# Patient Record
Sex: Male | Born: 1945
Health system: Southern US, Community
[De-identification: ages and names within clinical notes are randomized; demographics above are authoritative.]

## PROBLEM LIST (undated history)

## (undated) DIAGNOSIS — M1990 Unspecified osteoarthritis, unspecified site: Secondary | ICD-10-CM

## (undated) DIAGNOSIS — I252 Old myocardial infarction: Secondary | ICD-10-CM

## (undated) DIAGNOSIS — Z9289 Personal history of other medical treatment: Secondary | ICD-10-CM

## (undated) DIAGNOSIS — I4901 Ventricular fibrillation: Secondary | ICD-10-CM

## (undated) DIAGNOSIS — D649 Anemia, unspecified: Secondary | ICD-10-CM

## (undated) DIAGNOSIS — J439 Emphysema, unspecified: Secondary | ICD-10-CM

## (undated) DIAGNOSIS — F32A Depression, unspecified: Secondary | ICD-10-CM

## (undated) DIAGNOSIS — R569 Unspecified convulsions: Secondary | ICD-10-CM

## (undated) DIAGNOSIS — J80 Acute respiratory distress syndrome: Secondary | ICD-10-CM

## (undated) DIAGNOSIS — G4730 Sleep apnea, unspecified: Secondary | ICD-10-CM

## (undated) DIAGNOSIS — I5020 Unspecified systolic (congestive) heart failure: Secondary | ICD-10-CM

## (undated) DIAGNOSIS — C3410 Malignant neoplasm of upper lobe, unspecified bronchus or lung: Secondary | ICD-10-CM

## (undated) DIAGNOSIS — I509 Heart failure, unspecified: Secondary | ICD-10-CM

## (undated) DIAGNOSIS — I219 Acute myocardial infarction, unspecified: Secondary | ICD-10-CM

## (undated) DIAGNOSIS — I82409 Acute embolism and thrombosis of unspecified deep veins of unspecified lower extremity: Secondary | ICD-10-CM

## (undated) DIAGNOSIS — K8020 Calculus of gallbladder without cholecystitis without obstruction: Secondary | ICD-10-CM

## (undated) DIAGNOSIS — I2510 Atherosclerotic heart disease of native coronary artery without angina pectoris: Secondary | ICD-10-CM

## (undated) DIAGNOSIS — Q211 Atrial septal defect: Secondary | ICD-10-CM

## (undated) DIAGNOSIS — G4733 Obstructive sleep apnea (adult) (pediatric): Secondary | ICD-10-CM

## (undated) DIAGNOSIS — H269 Unspecified cataract: Secondary | ICD-10-CM

## (undated) DIAGNOSIS — I639 Cerebral infarction, unspecified: Secondary | ICD-10-CM

## (undated) DIAGNOSIS — T7840XA Allergy, unspecified, initial encounter: Secondary | ICD-10-CM

## (undated) DIAGNOSIS — C3490 Malignant neoplasm of unspecified part of unspecified bronchus or lung: Secondary | ICD-10-CM

## (undated) DIAGNOSIS — I255 Ischemic cardiomyopathy: Secondary | ICD-10-CM

## (undated) DIAGNOSIS — Z95810 Presence of automatic (implantable) cardiac defibrillator: Secondary | ICD-10-CM

## (undated) DIAGNOSIS — I2699 Other pulmonary embolism without acute cor pulmonale: Secondary | ICD-10-CM

## (undated) DIAGNOSIS — J449 Chronic obstructive pulmonary disease, unspecified: Secondary | ICD-10-CM

## (undated) DIAGNOSIS — IMO0002 Reserved for concepts with insufficient information to code with codable children: Secondary | ICD-10-CM

## (undated) DIAGNOSIS — Q2112 Patent foramen ovale: Secondary | ICD-10-CM

## (undated) DIAGNOSIS — K625 Hemorrhage of anus and rectum: Secondary | ICD-10-CM

## (undated) DIAGNOSIS — R911 Solitary pulmonary nodule: Secondary | ICD-10-CM

## (undated) DIAGNOSIS — R0902 Hypoxemia: Secondary | ICD-10-CM

## (undated) DIAGNOSIS — J189 Pneumonia, unspecified organism: Secondary | ICD-10-CM

## (undated) DIAGNOSIS — I10 Essential (primary) hypertension: Secondary | ICD-10-CM

## (undated) HISTORY — DX: Essential (primary) hypertension: I10

## (undated) HISTORY — PX: CARDIAC CATHETERIZATION: SHX172

## (undated) HISTORY — PX: IRRIGATION AND DEBRIDEMENT SEBACEOUS CYST: SHX5255

## (undated) HISTORY — DX: Hypoxemia: R09.02

## (undated) HISTORY — DX: Emphysema, unspecified: J43.9

## (undated) HISTORY — DX: Chronic obstructive pulmonary disease, unspecified: J44.9

## (undated) HISTORY — PX: UMBILICAL HERNIA REPAIR: SHX196

## (undated) HISTORY — PX: EYE SURGERY: SHX253

## (undated) HISTORY — DX: Allergy, unspecified, initial encounter: T78.40XA

## (undated) HISTORY — DX: Depression, unspecified: F32.A

## (undated) HISTORY — DX: Sleep apnea, unspecified: G47.30

## (undated) HISTORY — PX: FRACTURE SURGERY: SHX138

## (undated) HISTORY — DX: Reserved for concepts with insufficient information to code with codable children: IMO0002

## (undated) HISTORY — DX: Unspecified cataract: H26.9

## (undated) HISTORY — DX: Unspecified convulsions: R56.9

## (undated) HISTORY — PX: HERNIA REPAIR: SHX51

## (undated) HISTORY — DX: Cerebral infarction, unspecified: I63.9

---

## 1998-06-04 ENCOUNTER — Ambulatory Visit (HOSPITAL_BASED_OUTPATIENT_CLINIC_OR_DEPARTMENT_OTHER): Admission: RE | Admit: 1998-06-04 | Discharge: 1998-06-04 | Payer: Self-pay | Admitting: Surgery

## 2007-12-30 DIAGNOSIS — I2699 Other pulmonary embolism without acute cor pulmonale: Secondary | ICD-10-CM

## 2007-12-30 DIAGNOSIS — I82409 Acute embolism and thrombosis of unspecified deep veins of unspecified lower extremity: Secondary | ICD-10-CM

## 2007-12-30 HISTORY — DX: Acute embolism and thrombosis of unspecified deep veins of unspecified lower extremity: I82.409

## 2007-12-30 HISTORY — DX: Other pulmonary embolism without acute cor pulmonale: I26.99

## 2008-07-29 HISTORY — PX: OTHER SURGICAL HISTORY: SHX169

## 2008-07-29 HISTORY — PX: SPLENECTOMY: SUR1306

## 2008-09-23 DIAGNOSIS — I82409 Acute embolism and thrombosis of unspecified deep veins of unspecified lower extremity: Secondary | ICD-10-CM | POA: Insufficient documentation

## 2008-09-23 DIAGNOSIS — I2699 Other pulmonary embolism without acute cor pulmonale: Secondary | ICD-10-CM | POA: Insufficient documentation

## 2009-01-16 ENCOUNTER — Encounter: Admission: RE | Admit: 2009-01-16 | Discharge: 2009-04-16 | Payer: Self-pay | Admitting: Orthopedic Surgery

## 2009-04-17 ENCOUNTER — Encounter: Admission: RE | Admit: 2009-04-17 | Discharge: 2009-07-16 | Payer: Self-pay | Admitting: Orthopedic Surgery

## 2009-07-17 ENCOUNTER — Encounter: Admission: RE | Admit: 2009-07-17 | Discharge: 2009-10-15 | Payer: Self-pay | Admitting: Orthopedic Surgery

## 2009-10-16 ENCOUNTER — Encounter: Admission: RE | Admit: 2009-10-16 | Discharge: 2009-12-27 | Payer: Self-pay | Admitting: Orthopedic Surgery

## 2009-12-29 DIAGNOSIS — I639 Cerebral infarction, unspecified: Secondary | ICD-10-CM | POA: Insufficient documentation

## 2010-12-29 DIAGNOSIS — R569 Unspecified convulsions: Secondary | ICD-10-CM

## 2010-12-29 HISTORY — DX: Unspecified convulsions: R56.9

## 2011-03-24 ENCOUNTER — Encounter: Payer: Self-pay | Admitting: Physician Assistant

## 2011-03-24 DIAGNOSIS — I82409 Acute embolism and thrombosis of unspecified deep veins of unspecified lower extremity: Secondary | ICD-10-CM

## 2011-03-24 DIAGNOSIS — J449 Chronic obstructive pulmonary disease, unspecified: Secondary | ICD-10-CM

## 2011-03-24 DIAGNOSIS — I639 Cerebral infarction, unspecified: Secondary | ICD-10-CM

## 2011-03-24 DIAGNOSIS — I2699 Other pulmonary embolism without acute cor pulmonale: Secondary | ICD-10-CM

## 2011-03-24 DIAGNOSIS — I10 Essential (primary) hypertension: Secondary | ICD-10-CM

## 2011-10-06 DIAGNOSIS — S82209B Unspecified fracture of shaft of unspecified tibia, initial encounter for open fracture type I or II: Secondary | ICD-10-CM | POA: Insufficient documentation

## 2011-10-06 DIAGNOSIS — S72453B Displaced supracondylar fracture without intracondylar extension of lower end of unspecified femur, initial encounter for open fracture type I or II: Secondary | ICD-10-CM | POA: Insufficient documentation

## 2012-03-10 DIAGNOSIS — J439 Emphysema, unspecified: Secondary | ICD-10-CM | POA: Insufficient documentation

## 2012-03-10 DIAGNOSIS — Q250 Patent ductus arteriosus: Secondary | ICD-10-CM | POA: Insufficient documentation

## 2012-03-10 DIAGNOSIS — G4733 Obstructive sleep apnea (adult) (pediatric): Secondary | ICD-10-CM | POA: Insufficient documentation

## 2012-03-10 DIAGNOSIS — R0902 Hypoxemia: Secondary | ICD-10-CM | POA: Insufficient documentation

## 2012-07-22 ENCOUNTER — Encounter (INDEPENDENT_AMBULATORY_CARE_PROVIDER_SITE_OTHER): Payer: Self-pay | Admitting: Surgery

## 2012-07-22 ENCOUNTER — Ambulatory Visit (INDEPENDENT_AMBULATORY_CARE_PROVIDER_SITE_OTHER): Payer: BC Managed Care – PPO | Admitting: Surgery

## 2012-07-22 ENCOUNTER — Other Ambulatory Visit (INDEPENDENT_AMBULATORY_CARE_PROVIDER_SITE_OTHER): Payer: Self-pay | Admitting: General Surgery

## 2012-07-22 VITALS — BP 130/70 | HR 88 | Temp 97.8°F | Resp 20 | Ht 69.0 in | Wt 195.0 lb

## 2012-07-22 DIAGNOSIS — L089 Local infection of the skin and subcutaneous tissue, unspecified: Secondary | ICD-10-CM | POA: Insufficient documentation

## 2012-07-22 DIAGNOSIS — L723 Sebaceous cyst: Secondary | ICD-10-CM

## 2012-07-22 MED ORDER — SULFAMETHOXAZOLE-TMP DS 800-160 MG PO TABS: 1.0000 | ORAL_TABLET | Freq: Two times a day (BID) | ORAL | Status: DC

## 2012-07-22 NOTE — Patient Instructions (Signed)

## 2012-07-22 NOTE — Addendum Note (Signed)
Addended by: Luretha Murphy B on: 07/22/2012 11:31 AM   Modules accepted: Orders
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
## 2012-09-01 ENCOUNTER — Encounter (INDEPENDENT_AMBULATORY_CARE_PROVIDER_SITE_OTHER): Payer: Self-pay | Admitting: Surgery

## 2012-09-01 ENCOUNTER — Ambulatory Visit (INDEPENDENT_AMBULATORY_CARE_PROVIDER_SITE_OTHER): Payer: BC Managed Care – PPO | Admitting: Surgery

## 2012-09-01 VITALS — BP 102/70 | HR 88 | Temp 97.4°F | Resp 20 | Ht 69.0 in | Wt 200.2 lb

## 2012-09-01 DIAGNOSIS — L723 Sebaceous cyst: Secondary | ICD-10-CM

## 2012-09-01 DIAGNOSIS — L089 Local infection of the skin and subcutaneous tissue, unspecified: Secondary | ICD-10-CM
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
## 2012-09-01 NOTE — Patient Instructions (Signed)
Thanks for your patience.  If you need further assistance after leaving the office, please call our office and speak with a [facility] nurse.  [phone].  If you want to leave a message for Dr. Yossi Hinchman, please call his office phone at [phone]. 

## 2012-12-29 DIAGNOSIS — I219 Acute myocardial infarction, unspecified: Secondary | ICD-10-CM

## 2012-12-29 HISTORY — DX: Acute myocardial infarction, unspecified: I21.9

## 2013-01-22 ENCOUNTER — Emergency Department (HOSPITAL_COMMUNITY): Payer: BC Managed Care – PPO

## 2013-01-22 ENCOUNTER — Encounter (HOSPITAL_COMMUNITY): Payer: Self-pay | Admitting: Emergency Medicine

## 2013-01-22 ENCOUNTER — Inpatient Hospital Stay (HOSPITAL_COMMUNITY)
Admission: EM | Admit: 2013-01-22 | Discharge: 2013-02-04 | DRG: 549 | Disposition: A | Discharge status: Home Health | Payer: BC Managed Care – PPO | Attending: Cardiology | Admitting: Cardiology

## 2013-01-22 DIAGNOSIS — K8020 Calculus of gallbladder without cholecystitis without obstruction: Secondary | ICD-10-CM | POA: Diagnosis present

## 2013-01-22 DIAGNOSIS — Z8673 Personal history of transient ischemic attack (TIA), and cerebral infarction without residual deficits: Secondary | ICD-10-CM

## 2013-01-22 DIAGNOSIS — I2510 Atherosclerotic heart disease of native coronary artery without angina pectoris: Secondary | ICD-10-CM | POA: Diagnosis present

## 2013-01-22 DIAGNOSIS — I4901 Ventricular fibrillation: Principal | ICD-10-CM | POA: Diagnosis present

## 2013-01-22 DIAGNOSIS — R911 Solitary pulmonary nodule: Secondary | ICD-10-CM | POA: Diagnosis present

## 2013-01-22 DIAGNOSIS — I469 Cardiac arrest, cause unspecified: Secondary | ICD-10-CM

## 2013-01-22 DIAGNOSIS — I2582 Chronic total occlusion of coronary artery: Secondary | ICD-10-CM | POA: Diagnosis present

## 2013-01-22 DIAGNOSIS — I509 Heart failure, unspecified: Secondary | ICD-10-CM | POA: Diagnosis present

## 2013-01-22 DIAGNOSIS — Z7982 Long term (current) use of aspirin: Secondary | ICD-10-CM

## 2013-01-22 DIAGNOSIS — I5021 Acute systolic (congestive) heart failure: Secondary | ICD-10-CM | POA: Diagnosis not present

## 2013-01-22 DIAGNOSIS — Q2111 Secundum atrial septal defect: Secondary | ICD-10-CM

## 2013-01-22 DIAGNOSIS — G4733 Obstructive sleep apnea (adult) (pediatric): Secondary | ICD-10-CM | POA: Diagnosis present

## 2013-01-22 DIAGNOSIS — R7309 Other abnormal glucose: Secondary | ICD-10-CM | POA: Diagnosis not present

## 2013-01-22 DIAGNOSIS — I214 Non-ST elevation (NSTEMI) myocardial infarction: Secondary | ICD-10-CM | POA: Diagnosis present

## 2013-01-22 DIAGNOSIS — R569 Unspecified convulsions: Secondary | ICD-10-CM | POA: Diagnosis present

## 2013-01-22 DIAGNOSIS — Z86711 Personal history of pulmonary embolism: Secondary | ICD-10-CM

## 2013-01-22 DIAGNOSIS — J690 Pneumonitis due to inhalation of food and vomit: Secondary | ICD-10-CM | POA: Diagnosis present

## 2013-01-22 DIAGNOSIS — R791 Abnormal coagulation profile: Secondary | ICD-10-CM | POA: Diagnosis present

## 2013-01-22 DIAGNOSIS — J449 Chronic obstructive pulmonary disease, unspecified: Secondary | ICD-10-CM | POA: Diagnosis present

## 2013-01-22 DIAGNOSIS — I2699 Other pulmonary embolism without acute cor pulmonale: Secondary | ICD-10-CM

## 2013-01-22 DIAGNOSIS — T45515A Adverse effect of anticoagulants, initial encounter: Secondary | ICD-10-CM | POA: Diagnosis present

## 2013-01-22 DIAGNOSIS — E872 Acidosis, unspecified: Secondary | ICD-10-CM | POA: Diagnosis present

## 2013-01-22 DIAGNOSIS — Z86718 Personal history of other venous thrombosis and embolism: Secondary | ICD-10-CM

## 2013-01-22 DIAGNOSIS — G931 Anoxic brain damage, not elsewhere classified: Secondary | ICD-10-CM | POA: Diagnosis present

## 2013-01-22 DIAGNOSIS — J4489 Other specified chronic obstructive pulmonary disease: Secondary | ICD-10-CM | POA: Diagnosis present

## 2013-01-22 DIAGNOSIS — J9601 Acute respiratory failure with hypoxia: Secondary | ICD-10-CM | POA: Diagnosis present

## 2013-01-22 DIAGNOSIS — Z87891 Personal history of nicotine dependence: Secondary | ICD-10-CM

## 2013-01-22 DIAGNOSIS — R0902 Hypoxemia: Secondary | ICD-10-CM | POA: Diagnosis present

## 2013-01-22 DIAGNOSIS — E876 Hypokalemia: Secondary | ICD-10-CM | POA: Diagnosis not present

## 2013-01-22 DIAGNOSIS — Z79899 Other long term (current) drug therapy: Secondary | ICD-10-CM

## 2013-01-22 DIAGNOSIS — R5381 Other malaise: Secondary | ICD-10-CM | POA: Diagnosis not present

## 2013-01-22 DIAGNOSIS — I10 Essential (primary) hypertension: Secondary | ICD-10-CM | POA: Diagnosis present

## 2013-01-22 DIAGNOSIS — Q211 Atrial septal defect: Secondary | ICD-10-CM

## 2013-01-22 DIAGNOSIS — Z23 Encounter for immunization: Secondary | ICD-10-CM

## 2013-01-22 DIAGNOSIS — I2589 Other forms of chronic ischemic heart disease: Secondary | ICD-10-CM | POA: Diagnosis present

## 2013-01-22 DIAGNOSIS — D45 Polycythemia vera: Secondary | ICD-10-CM | POA: Diagnosis present

## 2013-01-22 DIAGNOSIS — I252 Old myocardial infarction: Secondary | ICD-10-CM | POA: Diagnosis present

## 2013-01-22 DIAGNOSIS — Z7901 Long term (current) use of anticoagulants: Secondary | ICD-10-CM

## 2013-01-22 DIAGNOSIS — J9600 Acute respiratory failure, unspecified whether with hypoxia or hypercapnia: Secondary | ICD-10-CM | POA: Diagnosis present

## 2013-01-22 HISTORY — DX: Atrial septal defect: Q21.1

## 2013-01-22 HISTORY — DX: Obstructive sleep apnea (adult) (pediatric): G47.33

## 2013-01-22 HISTORY — DX: Acute embolism and thrombosis of unspecified deep veins of unspecified lower extremity: I82.409

## 2013-01-22 HISTORY — DX: Calculus of gallbladder without cholecystitis without obstruction: K80.20

## 2013-01-22 HISTORY — DX: Patent foramen ovale: Q21.12

## 2013-01-22 HISTORY — DX: Other pulmonary embolism without acute cor pulmonale: I26.99

## 2013-01-22 HISTORY — DX: Solitary pulmonary nodule: R91.1

## 2013-01-22 HISTORY — DX: Unspecified systolic (congestive) heart failure: I50.20

## 2013-01-22 HISTORY — DX: Atherosclerotic heart disease of native coronary artery without angina pectoris: I25.10

## 2013-01-22 HISTORY — DX: Ischemic cardiomyopathy: I25.5

## 2013-01-22 LAB — POCT I-STAT, CHEM 8
BUN: 25 mg/dL — ABNORMAL HIGH (ref 6–23)
Calcium, Ion: 1.09 mmol/L — ABNORMAL LOW (ref 1.13–1.30)
Chloride: 105 mEq/L (ref 96–112)
Creatinine, Ser: 1.2 mg/dL (ref 0.50–1.35)
Glucose, Bld: 311 mg/dL — ABNORMAL HIGH (ref 70–99)
HCT: 66 % — ABNORMAL HIGH (ref 39.0–52.0)
Hemoglobin: 22.4 g/dL (ref 13.0–17.0)
Potassium: 3.8 mEq/L (ref 3.5–5.1)
Sodium: 142 mEq/L (ref 135–145)
TCO2: 23 mmol/L (ref 0–100)

## 2013-01-22 LAB — COMPREHENSIVE METABOLIC PANEL
ALT: 45 U/L (ref 0–53)
AST: 90 U/L — ABNORMAL HIGH (ref 0–37)
Albumin: 3.1 g/dL — ABNORMAL LOW (ref 3.5–5.2)
Alkaline Phosphatase: 119 U/L — ABNORMAL HIGH (ref 39–117)
BUN: 19 mg/dL (ref 6–23)
CO2: 17 mEq/L — ABNORMAL LOW (ref 19–32)
Calcium: 8.9 mg/dL (ref 8.4–10.5)
Chloride: 95 mEq/L — ABNORMAL LOW (ref 96–112)
Creatinine, Ser: 1.03 mg/dL (ref 0.50–1.35)
GFR calc Af Amer: 87 mL/min — ABNORMAL LOW (ref >90)
GFR calc non Af Amer: 75 mL/min — ABNORMAL LOW (ref >90)
Glucose, Bld: 317 mg/dL — ABNORMAL HIGH (ref 70–99)
Potassium: 4.7 mEq/L (ref 3.5–5.1)
Sodium: 135 mEq/L (ref 135–145)
Total Bilirubin: 0.3 mg/dL (ref 0.3–1.2)
Total Protein: 7.6 g/dL (ref 6.0–8.3)

## 2013-01-22 LAB — POCT I-STAT 3, ART BLOOD GAS (G3+)
Acid-base deficit: 10 mmol/L — ABNORMAL HIGH (ref 0.0–2.0)
Bicarbonate: 21.4 mEq/L (ref 20.0–24.0)
O2 Saturation: 85 %
Patient temperature: 35.7
TCO2: 23 mmol/L (ref 0–100)
pCO2 arterial: 63.6 mmHg (ref 35.0–45.0)
pH, Arterial: 7.128 — CL (ref 7.350–7.450)
pO2, Arterial: 63 mmHg — ABNORMAL LOW (ref 80.0–100.0)

## 2013-01-22 LAB — CBC WITH DIFFERENTIAL/PLATELET
Basophils Absolute: 0.3 10*3/uL — ABNORMAL HIGH (ref 0.0–0.1)
Basophils Relative: 1 % (ref 0–1)
Eosinophils Absolute: 0.9 10*3/uL — ABNORMAL HIGH (ref 0.0–0.7)
Eosinophils Relative: 3 % (ref 0–5)
HCT: 59.5 % — ABNORMAL HIGH (ref 39.0–52.0)
Hemoglobin: 19.8 g/dL — ABNORMAL HIGH (ref 13.0–17.0)
Lymphocytes Relative: 37 % (ref 12–46)
Lymphs Abs: 10.5 10*3/uL — ABNORMAL HIGH (ref 0.7–4.0)
MCH: 31.6 pg (ref 26.0–34.0)
MCHC: 33.3 g/dL (ref 30.0–36.0)
MCV: 94.9 fL (ref 78.0–100.0)
Monocytes Absolute: 1.4 10*3/uL — ABNORMAL HIGH (ref 0.1–1.0)
Monocytes Relative: 5 % (ref 3–12)
Neutro Abs: 15.4 10*3/uL — ABNORMAL HIGH (ref 1.7–7.7)
Neutrophils Relative %: 54 % (ref 43–77)
Platelets: 269 10*3/uL (ref 150–400)
RBC: 6.27 MIL/uL — ABNORMAL HIGH (ref 4.22–5.81)
RDW: 15.6 % — ABNORMAL HIGH (ref 11.5–15.5)
Smear Review: ADEQUATE
WBC: 28.5 10*3/uL — ABNORMAL HIGH (ref 4.0–10.5)

## 2013-01-22 LAB — MAGNESIUM: Magnesium: 2.1 mg/dL (ref 1.5–2.5)

## 2013-01-22 LAB — POCT I-STAT TROPONIN I: Troponin i, poc: 0.41 ng/mL (ref 0.00–0.08)

## 2013-01-22 LAB — TROPONIN I: Troponin I: 0.39 ng/mL (ref <0.30)

## 2013-01-22 MED ORDER — ETOMIDATE 2 MG/ML IV SOLN
20.0000 mg | Freq: Once | INTRAVENOUS | Status: AC
  Administered 2013-01-22: 20 mg via INTRAVENOUS

## 2013-01-22 MED ORDER — PROPOFOL 10 MG/ML IV EMUL
5.0000 ug/kg/min | Freq: Once | INTRAVENOUS | Status: DC
  Administered 2013-01-22: 20 ug/kg/min via INTRAVENOUS

## 2013-01-22 MED ORDER — SUCCINYLCHOLINE CHLORIDE 20 MG/ML IJ SOLN
100.0000 mg | Freq: Once | INTRAMUSCULAR | Status: AC
  Administered 2013-01-22: 100 mg via INTRAVENOUS

## 2013-01-22 MED ORDER — PROPOFOL 10 MG/ML IV EMUL
INTRAVENOUS | Status: AC
  Administered 2013-01-22: 10 ug/kg/min via INTRAVENOUS
  Filled 2013-01-22: qty 100

## 2013-01-22 MED ORDER — IOHEXOL 350 MG/ML SOLN
100.0000 mL | Freq: Once | INTRAVENOUS | Status: AC | PRN
  Administered 2013-01-22: 100 mL via INTRAVENOUS

## 2013-01-22 MED ORDER — ONDANSETRON HCL 4 MG/2ML IJ SOLN
INTRAMUSCULAR | Status: AC
  Administered 2013-01-22: 4 mg via INTRAVENOUS
  Filled 2013-01-22: qty 2

## 2013-01-22 NOTE — ED Notes (Signed)
Patient witnessed arrest at home, immediate CPR done by bystanders.  Patient was shocked x4 by EMS enroute to ED.  Patient was given one amp Epi, 450mg  Amiodarone and 2.5mg  Versed enroute to ED.  I/O established in right tibia.  Patient with [facility] airway in place.

## 2013-01-22 NOTE — ED Notes (Signed)
PT and PTT to be recollected by phlebotomy.
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
## 2013-01-22 NOTE — ED Notes (Addendum)
Patient arrived to [facility] with strong pulse.

## 2013-01-23 ENCOUNTER — Inpatient Hospital Stay (HOSPITAL_COMMUNITY): Payer: BC Managed Care – PPO

## 2013-01-23 ENCOUNTER — Encounter (HOSPITAL_COMMUNITY): Payer: Self-pay | Admitting: Internal Medicine

## 2013-01-23 DIAGNOSIS — I2699 Other pulmonary embolism without acute cor pulmonale: Secondary | ICD-10-CM

## 2013-01-23 DIAGNOSIS — I469 Cardiac arrest, cause unspecified: Secondary | ICD-10-CM

## 2013-01-23 DIAGNOSIS — J9601 Acute respiratory failure with hypoxia: Secondary | ICD-10-CM | POA: Diagnosis present

## 2013-01-23 DIAGNOSIS — R569 Unspecified convulsions: Secondary | ICD-10-CM

## 2013-01-23 DIAGNOSIS — J9589 Other postprocedural complications and disorders of respiratory system, not elsewhere classified: Secondary | ICD-10-CM | POA: Diagnosis not present

## 2013-01-23 DIAGNOSIS — J449 Chronic obstructive pulmonary disease, unspecified: Secondary | ICD-10-CM

## 2013-01-23 LAB — BASIC METABOLIC PANEL
BUN: 19 mg/dL (ref 6–23)
BUN: 17 mg/dL (ref 6–23)
BUN: 17 mg/dL (ref 6–23)
BUN: 16 mg/dL (ref 6–23)
BUN: 16 mg/dL (ref 6–23)
BUN: 16 mg/dL (ref 6–23)
BUN: 15 mg/dL (ref 6–23)
BUN: 15 mg/dL (ref 6–23)
CO2: 19 mEq/L (ref 19–32)
CO2: 19 mEq/L (ref 19–32)
CO2: 20 mEq/L (ref 19–32)
CO2: 21 mEq/L (ref 19–32)
CO2: 20 mEq/L (ref 19–32)
CO2: 18 mEq/L — ABNORMAL LOW (ref 19–32)
CO2: 19 mEq/L (ref 19–32)
CO2: 18 mEq/L — ABNORMAL LOW (ref 19–32)
Calcium: 8 mg/dL — ABNORMAL LOW (ref 8.4–10.5)
Calcium: 6.6 mg/dL — ABNORMAL LOW (ref 8.4–10.5)
Calcium: 7.5 mg/dL — ABNORMAL LOW (ref 8.4–10.5)
Calcium: 7.6 mg/dL — ABNORMAL LOW (ref 8.4–10.5)
Calcium: 7.7 mg/dL — ABNORMAL LOW (ref 8.4–10.5)
Calcium: 7.5 mg/dL — ABNORMAL LOW (ref 8.4–10.5)
Calcium: 7.7 mg/dL — ABNORMAL LOW (ref 8.4–10.5)
Calcium: 7.7 mg/dL — ABNORMAL LOW (ref 8.4–10.5)
Chloride: 103 mEq/L (ref 96–112)
Chloride: 112 mEq/L (ref 96–112)
Chloride: 108 mEq/L (ref 96–112)
Chloride: 111 mEq/L (ref 96–112)
Chloride: 111 mEq/L (ref 96–112)
Chloride: 112 mEq/L (ref 96–112)
Chloride: 113 mEq/L — ABNORMAL HIGH (ref 96–112)
Chloride: 110 mEq/L (ref 96–112)
Creatinine, Ser: 1.04 mg/dL (ref 0.50–1.35)
Creatinine, Ser: 0.86 mg/dL (ref 0.50–1.35)
Creatinine, Ser: 0.83 mg/dL (ref 0.50–1.35)
Creatinine, Ser: 0.75 mg/dL (ref 0.50–1.35)
Creatinine, Ser: 0.72 mg/dL (ref 0.50–1.35)
Creatinine, Ser: 0.66 mg/dL (ref 0.50–1.35)
Creatinine, Ser: 0.66 mg/dL (ref 0.50–1.35)
Creatinine, Ser: 0.65 mg/dL (ref 0.50–1.35)
GFR calc Af Amer: 86 mL/min — ABNORMAL LOW (ref >90)
GFR calc Af Amer: >90 mL/min (ref >90)
GFR calc Af Amer: >90 mL/min (ref >90)
GFR calc Af Amer: >90 mL/min (ref >90)
GFR calc Af Amer: >90 mL/min (ref >90)
GFR calc Af Amer: >90 mL/min (ref >90)
GFR calc Af Amer: >90 mL/min (ref >90)
GFR calc Af Amer: >90 mL/min (ref >90)
GFR calc non Af Amer: 74 mL/min — ABNORMAL LOW (ref >90)
GFR calc non Af Amer: 90 mL/min — ABNORMAL LOW (ref >90)
GFR calc non Af Amer: >90 mL/min (ref >90)
GFR calc non Af Amer: >90 mL/min (ref >90)
GFR calc non Af Amer: >90 mL/min (ref >90)
GFR calc non Af Amer: >90 mL/min (ref >90)
GFR calc non Af Amer: >90 mL/min (ref >90)
GFR calc non Af Amer: >90 mL/min (ref >90)
Glucose, Bld: 200 mg/dL — ABNORMAL HIGH (ref 70–99)
Glucose, Bld: 176 mg/dL — ABNORMAL HIGH (ref 70–99)
Glucose, Bld: 161 mg/dL — ABNORMAL HIGH (ref 70–99)
Glucose, Bld: 130 mg/dL — ABNORMAL HIGH (ref 70–99)
Glucose, Bld: 115 mg/dL — ABNORMAL HIGH (ref 70–99)
Glucose, Bld: 124 mg/dL — ABNORMAL HIGH (ref 70–99)
Glucose, Bld: 133 mg/dL — ABNORMAL HIGH (ref 70–99)
Glucose, Bld: 119 mg/dL — ABNORMAL HIGH (ref 70–99)
Potassium: 4.3 mEq/L (ref 3.5–5.1)
Potassium: 3.2 mEq/L — ABNORMAL LOW (ref 3.5–5.1)
Potassium: 3.4 mEq/L — ABNORMAL LOW (ref 3.5–5.1)
Potassium: 3.6 mEq/L (ref 3.5–5.1)
Potassium: 3.6 mEq/L (ref 3.5–5.1)
Potassium: 4.1 mEq/L (ref 3.5–5.1)
Potassium: 3.7 mEq/L (ref 3.5–5.1)
Potassium: 3.6 mEq/L (ref 3.5–5.1)
Sodium: 135 mEq/L (ref 135–145)
Sodium: 142 mEq/L (ref 135–145)
Sodium: 141 mEq/L (ref 135–145)
Sodium: 142 mEq/L (ref 135–145)
Sodium: 143 mEq/L (ref 135–145)
Sodium: 143 mEq/L (ref 135–145)
Sodium: 145 mEq/L (ref 135–145)
Sodium: 141 mEq/L (ref 135–145)

## 2013-01-23 LAB — GLUCOSE, CAPILLARY
Glucose-Capillary: 191 mg/dL — ABNORMAL HIGH (ref 70–99)
Glucose-Capillary: 176 mg/dL — ABNORMAL HIGH (ref 70–99)
Glucose-Capillary: 145 mg/dL — ABNORMAL HIGH (ref 70–99)
Glucose-Capillary: 171 mg/dL — ABNORMAL HIGH (ref 70–99)
Glucose-Capillary: 154 mg/dL — ABNORMAL HIGH (ref 70–99)
Glucose-Capillary: 128 mg/dL — ABNORMAL HIGH (ref 70–99)
Glucose-Capillary: 127 mg/dL — ABNORMAL HIGH (ref 70–99)
Glucose-Capillary: 118 mg/dL — ABNORMAL HIGH (ref 70–99)
Glucose-Capillary: 103 mg/dL — ABNORMAL HIGH (ref 70–99)
Glucose-Capillary: 102 mg/dL — ABNORMAL HIGH (ref 70–99)

## 2013-01-23 LAB — PROTIME-INR
INR: 3.68 — ABNORMAL HIGH (ref 0.00–1.49)
INR: 3.93 — ABNORMAL HIGH (ref 0.00–1.49)
INR: 4.33 — ABNORMAL HIGH (ref 0.00–1.49)
Prothrombin Time: 34.4 seconds — ABNORMAL HIGH (ref 11.6–15.2)
Prothrombin Time: 36.1 seconds — ABNORMAL HIGH (ref 11.6–15.2)
Prothrombin Time: 38.8 seconds — ABNORMAL HIGH (ref 11.6–15.2)

## 2013-01-23 LAB — POCT I-STAT 3, ART BLOOD GAS (G3+)
Acid-base deficit: 10 mmol/L — ABNORMAL HIGH (ref 0.0–2.0)
Bicarbonate: 22 mEq/L (ref 20.0–24.0)
O2 Saturation: 98 %
Patient temperature: 36
TCO2: 24 mmol/L (ref 0–100)
pCO2 arterial: 66 mmHg (ref 35.0–45.0)
pH, Arterial: 7.125 — CL (ref 7.350–7.450)
pO2, Arterial: 133 mmHg — ABNORMAL HIGH (ref 80.0–100.0)

## 2013-01-23 LAB — CBC
HCT: 54 % — ABNORMAL HIGH (ref 39.0–52.0)
HCT: 51.8 % (ref 39.0–52.0)
Hemoglobin: 18.2 g/dL — ABNORMAL HIGH (ref 13.0–17.0)
Hemoglobin: 17.2 g/dL — ABNORMAL HIGH (ref 13.0–17.0)
MCH: 30.8 pg (ref 26.0–34.0)
MCH: 30 pg (ref 26.0–34.0)
MCHC: 33.7 g/dL (ref 30.0–36.0)
MCHC: 33.2 g/dL (ref 30.0–36.0)
MCV: 91.4 fL (ref 78.0–100.0)
MCV: 90.4 fL (ref 78.0–100.0)
Platelets: 264 10*3/uL (ref 150–400)
Platelets: 241 10*3/uL (ref 150–400)
RBC: 5.91 MIL/uL — ABNORMAL HIGH (ref 4.22–5.81)
RBC: 5.73 MIL/uL (ref 4.22–5.81)
RDW: 15.7 % — ABNORMAL HIGH (ref 11.5–15.5)
RDW: 15.8 % — ABNORMAL HIGH (ref 11.5–15.5)
WBC: 21.7 10*3/uL — ABNORMAL HIGH (ref 4.0–10.5)
WBC: 16.5 10*3/uL — ABNORMAL HIGH (ref 4.0–10.5)

## 2013-01-23 LAB — APTT
aPTT: 42 seconds — ABNORMAL HIGH (ref 24–37)
aPTT: 43 seconds — ABNORMAL HIGH (ref 24–37)
aPTT: 43 seconds — ABNORMAL HIGH (ref 24–37)

## 2013-01-23 LAB — POCT I-STAT, CHEM 8
BUN: 14 mg/dL (ref 6–23)
Calcium, Ion: 1.12 mmol/L — ABNORMAL LOW (ref 1.13–1.30)
Chloride: 113 mEq/L — ABNORMAL HIGH (ref 96–112)
Creatinine, Ser: 0.8 mg/dL (ref 0.50–1.35)
Glucose, Bld: 125 mg/dL — ABNORMAL HIGH (ref 70–99)
HCT: 60 % — ABNORMAL HIGH (ref 39.0–52.0)
Hemoglobin: 20.4 g/dL — ABNORMAL HIGH (ref 13.0–17.0)
Potassium: 3.6 mEq/L (ref 3.5–5.1)
Sodium: 148 mEq/L — ABNORMAL HIGH (ref 135–145)
TCO2: 19 mmol/L (ref 0–100)

## 2013-01-23 LAB — CK TOTAL AND CKMB (NOT AT ARMC)
CK, MB: 81.8 ng/mL (ref 0.3–4.0)
CK, MB: 90.3 ng/mL (ref 0.3–4.0)
Relative Index: 10.1 — ABNORMAL HIGH (ref 0.0–2.5)
Relative Index: 9.6 — ABNORMAL HIGH (ref 0.0–2.5)
Total CK: 811 U/L — ABNORMAL HIGH (ref 7–232)
Total CK: 943 U/L — ABNORMAL HIGH (ref 7–232)

## 2013-01-23 LAB — TROPONIN I
Troponin I: 10.14 ng/mL (ref <0.30)
Troponin I: 16.5 ng/mL (ref <0.30)
Troponin I: 3.58 ng/mL (ref <0.30)
Troponin I: 7.11 ng/mL (ref <0.30)

## 2013-01-23 LAB — LACTIC ACID, PLASMA: Lactic Acid, Venous: 2.4 mmol/L — ABNORMAL HIGH (ref 0.5–2.2)

## 2013-01-23 LAB — MRSA PCR SCREENING: MRSA by PCR: NEGATIVE

## 2013-01-23 LAB — TSH: TSH: 1.154 u[IU]/mL (ref 0.350–4.500)

## 2013-01-23 LAB — HEMOGLOBIN A1C
Hgb A1c MFr Bld: 5.8 % — ABNORMAL HIGH (ref <5.7)
Mean Plasma Glucose: 120 mg/dL — ABNORMAL HIGH (ref <117)

## 2013-01-23 MED ORDER — CISATRACURIUM BOLUS VIA INFUSION
0.1000 mg/kg | Freq: Once | INTRAVENOUS | Status: AC
  Administered 2013-01-23: 9.1 mg via INTRAVENOUS
  Filled 2013-01-23: qty 10

## 2013-01-23 MED ORDER — SODIUM CHLORIDE 0.9 % IV SOLN
500.0000 mg | Freq: Two times a day (BID) | INTRAVENOUS | Status: DC
  Administered 2013-01-23 – 2013-01-27 (×11): 500 mg via INTRAVENOUS
  Filled 2013-01-23 (×14): qty 5

## 2013-01-23 MED ORDER — SODIUM CHLORIDE 0.9 % IV SOLN
INTRAVENOUS | Status: DC
  Administered 2013-01-23: 1000 mL via INTRAVENOUS

## 2013-01-23 MED ORDER — SODIUM CHLORIDE 0.9 % IV SOLN
25.0000 ug/h | INTRAVENOUS | Status: DC
  Administered 2013-01-23: 125 ug/h via INTRAVENOUS
  Administered 2013-01-23: 50 ug/h via INTRAVENOUS
  Filled 2013-01-23 (×3): qty 50

## 2013-01-23 MED ORDER — POTASSIUM CHLORIDE 10 MEQ/50ML IV SOLN
10.0000 meq | INTRAVENOUS | Status: AC
  Administered 2013-01-23 (×2): 10 meq via INTRAVENOUS

## 2013-01-23 MED ORDER — FENTANYL CITRATE 0.05 MG/ML IJ SOLN: 100.0000 ug | Freq: Once | INTRAMUSCULAR | Status: DC

## 2013-01-23 MED ORDER — SODIUM CHLORIDE 0.9 % IV SOLN
2000.0000 mL | Freq: Once | INTRAVENOUS | Status: AC
  Administered 2013-01-23: 2000 mL via INTRAVENOUS

## 2013-01-23 MED ORDER — METOPROLOL TARTRATE 12.5 MG HALF TABLET
12.5000 mg | ORAL_TABLET | Freq: Two times a day (BID) | ORAL | Status: DC
  Administered 2013-01-24 – 2013-01-25 (×3): 12.5 mg via ORAL
  Filled 2013-01-23 (×9): qty 1

## 2013-01-23 MED ORDER — CHLORHEXIDINE GLUCONATE 0.12 % MT SOLN
15.0000 mL | Freq: Two times a day (BID) | OROMUCOSAL | Status: DC
  Administered 2013-01-23 – 2013-01-26 (×7): 15 mL via OROMUCOSAL
  Filled 2013-01-23 (×8): qty 15

## 2013-01-23 MED ORDER — MIDAZOLAM BOLUS VIA INFUSION
1.0000 mg | INTRAVENOUS | Status: DC | PRN
  Filled 2013-01-23: qty 2

## 2013-01-23 MED ORDER — SODIUM CHLORIDE 0.9 % IV SOLN
1.0000 mg/h | INTRAVENOUS | Status: DC
  Administered 2013-01-23 – 2013-01-24 (×4): 4 mg/h via INTRAVENOUS
  Filled 2013-01-23 (×4): qty 10

## 2013-01-23 MED ORDER — SODIUM CHLORIDE 0.9 % IV SOLN
INTRAVENOUS | Status: DC
  Administered 2013-01-24: 10 mL/h via INTRAVENOUS
  Administered 2013-01-26 – 2013-01-31 (×3): via INTRAVENOUS

## 2013-01-23 MED ORDER — SODIUM BICARBONATE 8.4 % IV SOLN
INTRAVENOUS | Status: AC
  Administered 2013-01-23: 02:00:00
  Filled 2013-01-23: qty 50

## 2013-01-23 MED ORDER — NOREPINEPHRINE BITARTRATE 1 MG/ML IJ SOLN
0.5000 ug/min | INTRAVENOUS | Status: DC
  Administered 2013-01-23: 20 ug/min via INTRAVENOUS
  Administered 2013-01-23: 4 ug/min via INTRAVENOUS
  Administered 2013-01-24: 10 ug/min via INTRAVENOUS
  Filled 2013-01-23 (×3): qty 4

## 2013-01-23 MED ORDER — PANTOPRAZOLE SODIUM 40 MG IV SOLR: 40.0000 mg | INTRAVENOUS | Status: DC

## 2013-01-23 MED ORDER — SODIUM CHLORIDE 0.9 % IV SOLN: INTRAVENOUS | Status: DC

## 2013-01-23 MED ORDER — CISATRACURIUM BOLUS VIA INFUSION
0.0500 mg/kg | Freq: Once | INTRAVENOUS | Status: AC | PRN
  Filled 2013-01-23: qty 5

## 2013-01-23 MED ORDER — SODIUM CHLORIDE 0.9 % IV SOLN
1.5000 g | Freq: Four times a day (QID) | INTRAVENOUS | Status: AC
  Administered 2013-01-23 – 2013-01-30 (×28): 1.5 g via INTRAVENOUS
  Filled 2013-01-23 (×30): qty 1.5

## 2013-01-23 MED ORDER — SODIUM CHLORIDE 0.9 % IV SOLN
1.0000 ug/kg/min | INTRAVENOUS | Status: DC
  Administered 2013-01-23: 1 ug/kg/min via INTRAVENOUS
  Filled 2013-01-23 (×2): qty 20

## 2013-01-23 MED ORDER — FENTANYL BOLUS VIA INFUSION
50.0000 ug | INTRAVENOUS | Status: DC | PRN
  Filled 2013-01-23: qty 50

## 2013-01-23 MED ORDER — ARTIFICIAL TEARS OP OINT
1.0000 "application " | TOPICAL_OINTMENT | Freq: Three times a day (TID) | OPHTHALMIC | Status: DC
  Administered 2013-01-23 – 2013-01-24 (×6): 1 via OPHTHALMIC
  Filled 2013-01-23 (×3): qty 3.5

## 2013-01-23 MED ORDER — BIOTENE DRY MOUTH MT LIQD
15.0000 mL | Freq: Four times a day (QID) | OROMUCOSAL | Status: DC
  Administered 2013-01-23 – 2013-01-26 (×13): 15 mL via OROMUCOSAL

## 2013-01-23 MED ORDER — VASOPRESSIN 20 UNIT/ML IJ SOLN
0.0300 [IU]/min | INTRAVENOUS | Status: DC
  Filled 2013-01-23: qty 2.5

## 2013-01-23 MED ORDER — FREE WATER
200.0000 mL | Freq: Three times a day (TID) | Status: DC
  Administered 2013-01-23 – 2013-01-24 (×5): 200 mL

## 2013-01-23 MED ORDER — POTASSIUM CHLORIDE 10 MEQ/50ML IV SOLN
10.0000 meq | INTRAVENOUS | Status: AC
  Administered 2013-01-23 (×2): 10 meq via INTRAVENOUS
  Filled 2013-01-23 (×2): qty 50

## 2013-01-23 MED ORDER — INSULIN ASPART 100 UNIT/ML ~~LOC~~ SOLN
2.0000 [IU] | SUBCUTANEOUS | Status: DC
  Administered 2013-01-23: 4 [IU] via SUBCUTANEOUS
  Administered 2013-01-24: 2 [IU] via SUBCUTANEOUS

## 2013-01-23 MED ORDER — SODIUM CHLORIDE 0.45 % IV SOLN
INTRAVENOUS | Status: DC
  Administered 2013-01-23: 18:00:00 via INTRAVENOUS

## 2013-01-23 MED ORDER — SODIUM CHLORIDE 0.9 % IV SOLN
8.0000 mg/h | INTRAVENOUS | Status: DC
  Administered 2013-01-23 – 2013-01-25 (×5): 8 mg/h via INTRAVENOUS
  Filled 2013-01-23 (×11): qty 80

## 2013-01-23 MED ORDER — PROPOFOL 10 MG/ML IV EMUL
5.0000 ug/kg/min | INTRAVENOUS | Status: DC
  Administered 2013-01-23 (×2): 50 ug/kg/min via INTRAVENOUS
  Filled 2013-01-23: qty 100

## 2013-01-23 MED ORDER — FENTANYL CITRATE 0.05 MG/ML IJ SOLN: 100.0000 ug | Freq: Once | INTRAMUSCULAR | Status: AC | PRN

## 2013-01-23 MED ORDER — LORAZEPAM 2 MG/ML IJ SOLN
INTRAMUSCULAR | Status: AC
  Administered 2013-01-23: 2 mg
  Filled 2013-01-23: qty 1

## 2013-01-23 MED ORDER — LORAZEPAM 2 MG/ML IJ SOLN: 2.0000 mg | INTRAMUSCULAR | Status: DC | PRN

## 2013-01-23 MED ORDER — SODIUM CHLORIDE 0.9 % IV SOLN
INTRAVENOUS | Status: DC
  Administered 2013-01-23: 02:00:00 via INTRAVENOUS

## 2013-01-23 MED ORDER — ASPIRIN 300 MG RE SUPP
300.0000 mg | RECTAL | Status: DC
  Filled 2013-01-23: qty 1

## 2013-01-23 NOTE — Progress Notes (Signed)
PULMONARY  / CRITICAL CARE MEDICINE  Name: Travis Cruz MRN: [id number] DOB: [birth year]    LOS: 1  REFERRING MD :  EDP  CHIEF COMPLAINT:  Cardiac arrest  BRIEF PATIENT DESCRIPTION: 67 y.o. male former smoker with PMHX of COPD, chronic coumadin in setting of remote PE/DVT, clotting disorder NOS, and patent foramen ovale admitted 01/22/2013 s/p out-of-hospital pulseless Vfib arrest requiring defibrillation. Initiated on artic sun protocol and admitted to [facility] service.  Seizure noted in ED, UGI bleed on arrival to ICU  LINES / TUBES: 1/25 Left radial A line >>> 1/25 Left subclavian CVL >>> 1/25 Rt PIV >>> 1/25 OETT >>> 1/25 Foley catheter >>>  CULTURES: None.  ANTIBIOTICS: None.  SIGNIFICANT EVENTS:  1/25 - Admitted to [facility] s/p witnessed out-of-hospital pulseless V.fib arrest.  1/25 - Started on artic sun protocol. 1/25 - CTA chest negative for acute PE. Rib fractures +  LEVEL OF CARE:  ICU PRIMARY SERVICE:  PCCM CONSULTANTS:  [facility] CODE STATUS:  FULL DIET:  NPO DVT Px:  Coumadin  GI Px:  Protonix   HISTORY OF PRESENT ILLNESS:  Travis Cruz is a 67 year-old gentleman who presented to the emergency department after a witnessed cardiac arrest. He was in his usual state of health earlier today, and this evening his family heard a thump and found him without a pulse, and began CPR and called EMS. When EMS arrived, initial rhythm was v-fib, and he was shocked twice. At some point after that there was evidence of ST-elevation on a rhythm strip, but his initial EKG here did not show evidence of this. His chest x-ray revealed bilateral infiltrates, and a CT-angio of the chest revealed no evidence of PE or dissection. He did have dense consolidation of the right upper lobe, and consolidation of the left lower lobe, in addition to diffuse emphysematous changes. He was initiated on hypothermia protocol.   SUBJECTIVE / INTERVAL HISTORY:  Intubated, sedated, unable to provide  history  Interval History: - Off of Levophed, remains in Nimbex - Remains on arctic sun protocol, will reach goal temp approximately 1/27 3:30AM - Had bloody output from his OG tube.   VITAL SIGNS: Temp:  [90.7 F (32.6 C)-96.8 F (36 C)] 91.4 F (33 C) (01/26 0500) Pulse Rate:  [48-106] 82  (01/26 0500) Resp:  [13-34] 30  (01/26 0500) BP: (58-180)/(31-111) 136/96 mmHg (01/26 0500) SpO2:  [88 %-100 %] 95 % (01/26 0500) Arterial Line BP: (56-155)/(47-101) 148/94 mmHg (01/26 0500) FiO2 (%):  [50 %-100 %] 50 % (01/26 0328) Weight:  [196 lb 10.4 oz (89.2 kg)-200 lb 9.9 oz (91 kg)] 196 lb 10.4 oz (89.2 kg) (01/26 0100) HEMODYNAMICS: CVP:  [13 mmHg-20 mmHg] 13 mmHg VENTILATOR SETTINGS: Vent Mode:  [-] PRVC FiO2 (%):  [50 %-100 %] 50 % Set Rate:  [10 bmp-34 bmp] 30 bmp Vt Set:  [450 mL] 450 mL PEEP:  [8 cmH20-14 cmH20] 8 cmH20 Plateau Pressure:  [21 cmH20-23 cmH20] 21 cmH20 INTAKE / OUTPUT: Intake/Output      01/25 0701 - 01/26 0700   I.V. (mL/kg) 4336 (48.6)   IV Piggyback 205   Total Intake(mL/kg) 4541 (50.9)   Urine (mL/kg/hr) 600 (0.3)   Total Output 600   Net +3941         PHYSICAL EXAMINATION: General: Vital signs reviewed and noted. Intubated, sedated, paralyzed. No acute distress.  Head: Normocephalic, atraumatic.  Lungs:  Normal respiratory effort. Coarse breath sounds, BL.  Heart: RRR.  Abdomen:  BS  normoactive. Soft, Nondistended, non-tender.   Extremities: No pretibial edema.  Skin: Multiple bruises over hands, arms.  Neuro: Intubated, sedated,      IMAGING: 1/26 - pCXR - Endotracheal tube tip 3.5 cm above the carina. Left central line tip proximal superior vena cava level directed laterally. Diffuse asymmetric air space disease greater on the right without significant change.   MEDICATIONS: Infusions    . sodium chloride 10 mL/hr at 01/23/13 0200  . sodium chloride 20 mL/hr at 01/23/13 0200  . sodium chloride 1,000 mL/hr at 01/23/13 0300  .  cisatracurium (NIMBEX) infusion 1 mcg/kg/min (01/23/13 0157)  . fentaNYL infusion INTRAVENOUS 125 mcg/hr (01/23/13 0500)  . midazolam (VERSED) infusion 4 mg/hr (01/23/13 0218)  . norepinephrine (LEVOPHED) Adult infusion Stopped (01/23/13 0400)  . pantoprozole (PROTONIX) infusion 8 mg/hr (01/23/13 0242)  . vasopressin (PITRESSIN) infusion - *FOR SHOCK*      Scheduled    . antiseptic oral rinse  15 mL Mouth Rinse QID  . artificial tears  1 application Both Eyes Q8H  . chlorhexidine  15 mL Mouth Rinse BID  . fentaNYL  100 mcg Intravenous Once  . insulin aspart  2-6 Units Subcutaneous Q4H  . levetiracetam  500 mg Intravenous BID  . metoprolol tartrate  12.5 mg Oral BID  . potassium chloride  10 mEq Intravenous Q1 Hr x 2     DIAGNOSES: Principal Problem:  *Cardiac arrest Active Problems:  COPD (chronic obstructive pulmonary disease)  Seizure-like activity  Acute respiratory failure with hypoxia   ASSESSMENT / PLAN:  PULMONARY  Lab 01/23/13 0240 01/23/13 0012 01/22/13 2255  PHART 7.319* 7.125* 7.128*  PCO2ART 38.6 66.0* 63.6*  PO2ART 195.0* 133.0* 63.0*  HCO3 20.1 22.0 21.4  TCO2 21.4 24 23    A:  1) Acute hypercarbic respiratory failure - secondary to ARDS. CTA negative for PE, but suggests possible upper lobe airspace disease. 2) Severe ARDS - paO2/FiO2 ratio of 63 on admission = severe ARDS. TV at goal of 6cc. 3) LUL lung nodule - 1.5 cm airspace opacity versus malignancy. P:  - ARDS protocol on vent  - Sedation and NMB per hypothermia protocol.  - Obtain tracheal aspirate/ respiratory virus panel,  -  broad spectrum ABx. - Nodule will need FU once acute issues resolved -  CARDIOVASCULAR  Lab 01/23/13 0500 01/22/13 2330 01/22/13 2214  CKTOTAL -- -- --  CKMB -- -- --  TROPONINI 16.50* 3.58* 0.39*  No results found for this basename: PROBNP:3 in the last 168 hours A:  1) NSTEMI - admission EKG concerning for anterior infarct, T wave inversions in inferolateral  leads, although poor baseline. No comparison. troponins continue to trend upwards. Not on heparin 2/2 blood secretions from OG tube. 2) Cardiac arrest on hypothermia protocol P:  - Pending final 2-D echo - EF 45%, no wma - will need cath eventually if neuro intact -   RENAL  Lab 01/23/13 0500 01/23/13 0259 01/23/13 0105 01/22/13 2214  NA 141 142 135 --  K 3.4* 3.2* 4.3 --  CL 108 112 103 --  CO2 20 19 19  --  BUN 17 17 19  --  CREATININE 0.83 0.86 1.04 --  GLUCOSE 161* 176* 200* --  MG -- -- -- 2.1  PHOS -- -- -- --   Intake/Output Summary (Last 24 hours) at 01/23/13 0555 Last data filed at 01/23/13 0535  Gross per 24 hour  Intake 4541.02 ml  Output    600 ml  Net 3941.02 ml   A:  1) Anion-gap metabolic acidosis - resolving, current AG of 13. On admission AG 25 with delta-delta of 1.8 indicating purse AG metabolic acidosis. Possibly secondary to lactic acidosis following resuscitative events. High risk for  renal failure due to ATn/ contrast P:  - Continue to monitor renal function. - Replete K with goal K ~4, Mg~2  GASTROINTESTINAL  Lab 01/22/13 2214  AST 90*  ALT 45  ALKPHOS 119*  BILITOT 0.3  PROT 7.6  ALBUMIN 3.1*   A:  1) Bloody secretions from OG tube - noted 1/25. P:  - IV PPI - Trend CBC q8h- if drops significantly may have to stop hypothermia - Hold feeds for now.  HEMATOLOGIC  Lab 01/23/13 0105 01/22/13 2359 01/22/13 2226 01/22/13 2214  WBC -- -- -- 28.5*  HGB -- -- 22.4* 19.8*  HCT -- -- 66.0* 59.5*  MCV -- -- -- 94.9  PLT -- -- -- 269  APTT 42* 43* -- --  INR 3.68* 3.93* -- --   A:  1) Polycythemia - likely in setting of COPD and acutely in setting hypoxic respiratory failure. Was previously supposed to be evaluated by a specialist, deferred. History of hypercoaguable state (NOS), concerning for myeloproliferative disorder. 2) Chronic coumadin for PE/DVT/ hypercoaguable state NOS - INR goal 2-3. Supratherapeutic on admission.  P:  - Consider  FFP if Hb drops  INFECTIOUS  Lab 01/22/13 2214  WBC 28.5*  NEUTROABS 15.4*  LATICACIDVEN --  PROCALCITON --   A:  1) R/O CAP vs aspiration PNA - CT concerning for upper lobe infiltrate. Feeling well just prior to admission, making CAP less likely. Possible aspiration during arrest. P:  - Consider Unasyn for empiric aspiration PNA - PCT algorithm - Check respiratory cultures - Check blood cultures.  ENDOCRINE / RHEUMATOLOGIC  Lab 01/23/13 0500 01/23/13 0410 01/23/13 0303 01/23/13 0202 01/23/13 0112  GLUCAP 154* 171* 145* 176* 191*   No results found for this basename: HGBA1C, TSH    A:  1) Hyperglycemia - possible stress demargination in setting of arrest. No known hx of diabetes. P:  - Check A1c, TSH - ICU hyperglycemia protocol   NEUROLOGIC / PSYCHIATRIC   A:  1) Seizures - on home keppra for seizure prophylaxis after stroke. Reported seizure activity in ED. 2) History of stroke  P:  - Continue aspirin per tube. - Currently paralyzed, sedated, ct keppra -Rpt imaging based on neuro exam    CLINICAL SUMMARY: 67 y.o. male former smoker with PMHX of COPD, chronic coumadin in setting of remote PE/DVT, clotting disorder NOS, and patent foramen ovale admitted 01/22/2013 s/p out-of-hospital pulseless Vfib arrest requiring defibrillation, intubated en route. Initiated on artic sun protocol and admitted to [facility] service. Continue per [employer] sun protocol, but low threshold to stop if bleed worsens  Care during the described time interval was provided by me and/or other providers on the critical care team.  I have reviewed this patient's available data, including medical history, events of note, physical examination and test results as part of my evaluation  CC time x  40 m  Cyril Mourning MD. Tonny Bollman. [facility] Pager [phone] If no response call [phone]     01/23/2013, 5:55 AM

## 2013-01-23 NOTE — Procedures (Signed)
Arterial Catheter Insertion Procedure Note Saturnino Liew [id number] [birth year]  Procedure: Insertion of Arterial Catheter  Indications: Blood pressure monitoring and Frequent blood sampling  Procedure Details Consent: Risks of procedure as well as the alternatives and risks of each were explained to the (patient/caregiver).  Consent for procedure obtained. and Unable to obtain consent because of emergent medical necessity. Time Out: Verified patient identification, verified procedure, site/side was marked, verified correct patient position, special equipment/implants available, medications/allergies/relevent history reviewed, required imaging and test results available.  Performed  Maximum sterile technique was used including antiseptics, cap, gloves, gown, hand hygiene and mask. Skin prep: Chlorhexidine; local anesthetic administered 20 gauge catheter was inserted into left radial artery using the Seldinger technique.  Evaluation Blood flow good; BP tracing good. Complications: No apparent complications. assited by Merdis Delay RRT,RCP   Vilinda Blanks, Burna Cash 01/23/2013

## 2013-01-23 NOTE — Plan of Care (Signed)
Problem: Phase I Progression Outcomes Goal: Maintain MAP > 85 mmHg Outcome: Progressing Norepinephrine titrated to keep MAP > 85.

## 2013-01-23 NOTE — ED Provider Notes (Signed)
I saw and evaluated the patient, reviewed the resident's note and I agree with the findings and plan.   Aldene Hendon, MD 01/23/13 1532 

## 2013-01-23 NOTE — Procedures (Signed)
Central Venous Catheter Insertion Procedure Note Travis Cruz [id number] 07/28/1946  Procedure: Insertion of Central Venous Catheter Indications: Drug and/or fluid administration  Procedure Details Consent: Unable to obtain consent because of altered level of consciousness. Time Out: Verified patient identification, verified procedure, site/side was marked, verified correct patient position, special equipment/implants available, medications/allergies/relevent history reviewed, required imaging and test results available.  Performed  Maximum sterile technique was used including antiseptics, cap, gloves, gown, hand hygiene, mask and sheet. Skin prep: Chlorhexidine; local anesthetic administered A antimicrobial bonded/coated triple lumen catheter was placed in the left subclavian vein using the Seldinger technique.  Evaluation Blood flow good Complications: No apparent complications Patient did tolerate procedure well. Chest X-ray ordered to verify placement.  CXR: normal.  Travis Cruz, M.D. Pulmonary and Critical Care Medicine Call E-link with questions [phone] 01/23/2013, 1:47 AM

## 2013-01-23 NOTE — Progress Notes (Signed)
2D echo shows normal LV dimensions with mild LV dysfunction EF 40-45%.  There are no obvious wall motion abnormalities apical windows were poor and endocardium could not be adequately visualized in apical views.  No evidence of pericardial effusion.  RV and RA mildly enlarged with mild TR.  ? Whether LV depressed due to acidosis vs. Ischemia.  Given abnormal EKG and reduced LVF, will need full ischemic workup pending outcome of neuro status once rewarming begins.  Further workup per Dr. Antoine Poche.

## 2013-01-23 NOTE — ED Notes (Signed)
Return from ct scan pt continues to have seizure like activity with bilateral arms  Drawing with elbows out pulling toward head, Pt does not respond to painful stimuli or follow any commands. Ativan 2 mg given. Echocardiogram in progress.
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
## 2013-01-23 NOTE — Progress Notes (Signed)
SUBJECTIVE:  Intubated, sedated and cooled.     PHYSICAL EXAM Filed Vitals:   01/23/13 0500 01/23/13 0600 01/23/13 0700 01/23/13 0730  BP: 136/96 122/88 107/85 109/78  Pulse: 82 72 64 62  Temp: 91.4 F (33 C) 92.1 F (33.4 C) 91.9 F (33.3 C)   TempSrc: Core (Comment)  Core (Comment)   Resp: 30 29 30 30   Height:      Weight:      SpO2: 95% 95% 97% 97%   General:  Intubated and sedated.   Lungs:  Decreased breath sounds Heart:  Distant heart sounds Abdomen:  Absent bowel sounds, bloody drainage from the NG Extremities:  Decreased distal pulses  LABS: Lab Results  Component Value Date   TROPONINI 16.50* 01/23/2013   Results for orders placed during the hospital encounter of 01/22/13 (from the past 24 hour(s))  CBC WITH DIFFERENTIAL     Status: Abnormal   Collection Time   01/22/13 10:14 PM      Component Value Range   WBC 28.5 (*) 4.0 - 10.5 K/uL   RBC 6.27 (*) 4.22 - 5.81 MIL/uL   Hemoglobin 19.8 (*) 13.0 - 17.0 g/dL   HCT 56.2 (*) 13.0 - 86.5 %   MCV 94.9  78.0 - 100.0 fL   MCH 31.6  26.0 - 34.0 pg   MCHC 33.3  30.0 - 36.0 g/dL   RDW 78.4 (*) 69.6 - 29.5 %   Platelets 269  150 - 400 K/uL   Neutrophils Relative 54  43 - 77 %   Lymphocytes Relative 37  12 - 46 %   Monocytes Relative 5  3 - 12 %   Eosinophils Relative 3  0 - 5 %   Basophils Relative 1  0 - 1 %   Neutro Abs 15.4 (*) 1.7 - 7.7 K/uL   Lymphs Abs 10.5 (*) 0.7 - 4.0 K/uL   Monocytes Absolute 1.4 (*) 0.1 - 1.0 K/uL   Eosinophils Absolute 0.9 (*) 0.0 - 0.7 K/uL   Basophils Absolute 0.3 (*) 0.0 - 0.1 K/uL   RBC Morphology POLYCHROMASIA PRESENT     WBC Morphology ATYPICAL LYMPHOCYTES     Smear Review PLATELETS APPEAR ADEQUATE    COMPREHENSIVE METABOLIC PANEL     Status: Abnormal   Collection Time   01/22/13 10:14 PM      Component Value Range   Sodium 135  135 - 145 mEq/L   Potassium 4.7  3.5 - 5.1 mEq/L   Chloride 95 (*) 96 - 112 mEq/L   CO2 17 (*) 19 - 32 mEq/L   Glucose, Bld 317 (*) 70 - 99  mg/dL   BUN 19  6 - 23 mg/dL   Creatinine, Ser 2.84  0.50 - 1.35 mg/dL   Calcium 8.9  8.4 - 13.2 mg/dL   Total Protein 7.6  6.0 - 8.3 g/dL   Albumin 3.1 (*) 3.5 - 5.2 g/dL   AST 90 (*) 0 - 37 U/L   ALT 45  0 - 53 U/L   Alkaline Phosphatase 119 (*) 39 - 117 U/L   Total Bilirubin 0.3  0.3 - 1.2 mg/dL   GFR calc non Af Amer 75 (*) >90 mL/min   GFR calc Af Amer 87 (*) >90 mL/min  TROPONIN I     Status: Abnormal   Collection Time   01/22/13 10:14 PM      Component Value Range   Troponin I 0.39 (*) <0.30 ng/mL  MAGNESIUM  Status: Normal   Collection Time   01/22/13 10:14 PM      Component Value Range   Magnesium 2.1  1.5 - 2.5 mg/dL  POCT I-STAT TROPONIN I     Status: Abnormal   Collection Time   01/22/13 10:15 PM      Component Value Range   Troponin i, poc 0.41 (*) 0.00 - 0.08 ng/mL   Comment NOTIFIED PHYSICIAN     Comment 3           POCT I-STAT, CHEM 8     Status: Abnormal   Collection Time   01/22/13 10:26 PM      Component Value Range   Sodium 142  135 - 145 mEq/L   Potassium 3.8  3.5 - 5.1 mEq/L   Chloride 105  96 - 112 mEq/L   BUN 25 (*) 6 - 23 mg/dL   Creatinine, Ser 7.82  0.50 - 1.35 mg/dL   Glucose, Bld 956 (*) 70 - 99 mg/dL   Calcium, Ion 2.13 (*) 1.13 - 1.30 mmol/L   TCO2 23  0 - 100 mmol/L   Hemoglobin 22.4 (*) 13.0 - 17.0 g/dL   HCT 08.6 (*) 57.8 - 46.9 %   Comment NOTIFIED PHYSICIAN    POCT I-STAT 3, BLOOD GAS (G3+)     Status: Abnormal   Collection Time   01/22/13 10:55 PM      Component Value Range   pH, Arterial 7.128 (*) 7.350 - 7.450   pCO2 arterial 63.6 (*) 35.0 - 45.0 mmHg   pO2, Arterial 63.0 (*) 80.0 - 100.0 mmHg   Bicarbonate 21.4  20.0 - 24.0 mEq/L   TCO2 23  0 - 100 mmol/L   O2 Saturation 85.0     Acid-base deficit 10.0 (*) 0.0 - 2.0 mmol/L   Patient temperature 35.7 C     Collection site ARTERIAL LINE     Drawn by RT     Sample type ARTERIAL     Comment NOTIFIED PHYSICIAN    TROPONIN I     Status: Abnormal   Collection Time   01/22/13  11:30 PM      Component Value Range   Troponin I 3.58 (*) <0.30 ng/mL  PROTIME-INR     Status: Abnormal   Collection Time   01/22/13 11:59 PM      Component Value Range   Prothrombin Time 36.1 (*) 11.6 - 15.2 seconds   INR 3.93 (*) 0.00 - 1.49  APTT     Status: Abnormal   Collection Time   01/22/13 11:59 PM      Component Value Range   aPTT 43 (*) 24 - 37 seconds  POCT I-STAT 3, BLOOD GAS (G3+)     Status: Abnormal   Collection Time   01/23/13 12:12 AM      Component Value Range   pH, Arterial 7.125 (*) 7.350 - 7.450   pCO2 arterial 66.0 (*) 35.0 - 45.0 mmHg   pO2, Arterial 133.0 (*) 80.0 - 100.0 mmHg   Bicarbonate 22.0  20.0 - 24.0 mEq/L   TCO2 24  0 - 100 mmol/L   O2 Saturation 98.0     Acid-base deficit 10.0 (*) 0.0 - 2.0 mmol/L   Patient temperature 36.0 C     Collection site RADIAL, ALLEN'S TEST ACCEPTABLE     Sample type ARTERIAL     Comment NOTIFIED PHYSICIAN    BASIC METABOLIC PANEL     Status: Abnormal   Collection Time  01/23/13  1:05 AM      Component Value Range   Sodium 135  135 - 145 mEq/L   Potassium 4.3  3.5 - 5.1 mEq/L   Chloride 103  96 - 112 mEq/L   CO2 19  19 - 32 mEq/L   Glucose, Bld 200 (*) 70 - 99 mg/dL   BUN 19  6 - 23 mg/dL   Creatinine, Ser 1.61  0.50 - 1.35 mg/dL   Calcium 8.0 (*) 8.4 - 10.5 mg/dL   GFR calc non Af Amer 74 (*) >90 mL/min   GFR calc Af Amer 86 (*) >90 mL/min  PROTIME-INR     Status: Abnormal   Collection Time   01/23/13  1:05 AM      Component Value Range   Prothrombin Time 34.4 (*) 11.6 - 15.2 seconds   INR 3.68 (*) 0.00 - 1.49  APTT     Status: Abnormal   Collection Time   01/23/13  1:05 AM      Component Value Range   aPTT 42 (*) 24 - 37 seconds  GLUCOSE, CAPILLARY     Status: Abnormal   Collection Time   01/23/13  1:12 AM      Component Value Range   Glucose-Capillary 191 (*) 70 - 99 mg/dL  GLUCOSE, CAPILLARY     Status: Abnormal   Collection Time   01/23/13  2:02 AM      Component Value Range   Glucose-Capillary  176 (*) 70 - 99 mg/dL  MRSA PCR SCREENING     Status: Normal   Collection Time   01/23/13  2:33 AM      Component Value Range   MRSA by PCR NEGATIVE  NEGATIVE  BLOOD GAS, ARTERIAL     Status: Abnormal   Collection Time   01/23/13  2:40 AM      Component Value Range   FIO2 0.90     Delivery systems VENTILATOR     Mode PRESSURE REGULATED VOLUME CONTROL     VT 0.450     Rate 34     Peep/cpap 14.0     pH, Arterial 7.319 (*) 7.350 - 7.450   pCO2 arterial 38.6  35.0 - 45.0 mmHg   pO2, Arterial 195.0 (*) 80.0 - 100.0 mmHg   Bicarbonate 20.1  20.0 - 24.0 mEq/L   TCO2 21.4  0 - 100 mmol/L   Acid-base deficit 5.5 (*) 0.0 - 2.0 mmol/L   O2 Saturation 99.2     Patient temperature 94.0     Collection site A-LINE     Drawn by [id number]     Sample type ARTERIAL DRAW    BASIC METABOLIC PANEL     Status: Abnormal   Collection Time   01/23/13  2:59 AM      Component Value Range   Sodium 142  135 - 145 mEq/L   Potassium 3.2 (*) 3.5 - 5.1 mEq/L   Chloride 112  96 - 112 mEq/L   CO2 19  19 - 32 mEq/L   Glucose, Bld 176 (*) 70 - 99 mg/dL   BUN 17  6 - 23 mg/dL   Creatinine, Ser 4.09  0.50 - 1.35 mg/dL   Calcium 6.6 (*) 8.4 - 10.5 mg/dL   GFR calc non Af Amer 90 (*) >90 mL/min   GFR calc Af Amer >90  >90 mL/min  GLUCOSE, CAPILLARY     Status: Abnormal   Collection Time   01/23/13  3:03 AM  Component Value Range   Glucose-Capillary 145 (*) 70 - 99 mg/dL  GLUCOSE, CAPILLARY     Status: Abnormal   Collection Time   01/23/13  4:10 AM      Component Value Range   Glucose-Capillary 171 (*) 70 - 99 mg/dL  TROPONIN I     Status: Abnormal   Collection Time   01/23/13  5:00 AM      Component Value Range   Troponin I 16.50 (*) <0.30 ng/mL  BASIC METABOLIC PANEL     Status: Abnormal   Collection Time   01/23/13  5:00 AM      Component Value Range   Sodium 141  135 - 145 mEq/L   Potassium 3.4 (*) 3.5 - 5.1 mEq/L   Chloride 108  96 - 112 mEq/L   CO2 20  19 - 32 mEq/L   Glucose, Bld 161 (*) 70 -  99 mg/dL   BUN 17  6 - 23 mg/dL   Creatinine, Ser 1.61  0.50 - 1.35 mg/dL   Calcium 7.5 (*) 8.4 - 10.5 mg/dL   GFR calc non Af Amer >90  >90 mL/min   GFR calc Af Amer >90  >90 mL/min  GLUCOSE, CAPILLARY     Status: Abnormal   Collection Time   01/23/13  5:00 AM      Component Value Range   Glucose-Capillary 154 (*) 70 - 99 mg/dL  BASIC METABOLIC PANEL     Status: Abnormal   Collection Time   01/23/13  6:50 AM      Component Value Range   Sodium 142  135 - 145 mEq/L   Potassium 3.6  3.5 - 5.1 mEq/L   Chloride 111  96 - 112 mEq/L   CO2 21  19 - 32 mEq/L   Glucose, Bld 130 (*) 70 - 99 mg/dL   BUN 16  6 - 23 mg/dL   Creatinine, Ser 0.96  0.50 - 1.35 mg/dL   Calcium 7.6 (*) 8.4 - 10.5 mg/dL   GFR calc non Af Amer >90  >90 mL/min   GFR calc Af Amer >90  >90 mL/min    Intake/Output Summary (Last 24 hours) at 01/23/13 0748 Last data filed at 01/23/13 0700  Gross per 24 hour  Intake 4695.02 ml  Output   1700 ml  Net 2995.02 ml    EKG:  Sinus tachycardia, rate 101, RAD, old anteroseptal MI.  Non specific diffuse ST T wave changes.    ASSESSMENT AND PLAN:  Cardiac arrest:  Etiology unclear.  Echo with mildly reduced EF.  No evidence of PE or dissection on CT.  Troponin elevated.  Suspect arrhythmia related to acute coronary event.  He will need cath eventually pending his neuro work up.   Telemetry without arrhythmias.  He has bleeding from the NG tube and an INR of above so he is currently not on ASA or heparin.   History of clotting disorder.  He has been on warfarin and an INR is 3.93.    Marked elevation of Hb and HCT - Previous history of this.  Follow.    Fayrene Fearing [facility] 01/23/2013 7:48 AM

## 2013-01-23 NOTE — Progress Notes (Signed)
Echocardiogram 2D Echocardiogram limited has been performed.  Travis Cruz 01/23/2013, 12:39 AM
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
## 2013-01-23 NOTE — Progress Notes (Signed)
Called to ed to provide support to family of pt found unresponsive. Stayed w/family until pt was stabilized and dr updated family on pt's condition. Family appeared to be fine. Provided support as needed and offered nutrition.  Marjory Lies Chaplain

## 2013-01-23 NOTE — ED Notes (Signed)
Escort pt to CT scan with respiratory therapy. Pt remains sedated with periods of seizure like activity. Critical Care aware.

## 2013-01-24 ENCOUNTER — Inpatient Hospital Stay (HOSPITAL_COMMUNITY): Payer: BC Managed Care – PPO

## 2013-01-24 LAB — BASIC METABOLIC PANEL
BUN: 12 mg/dL (ref 6–23)
BUN: 13 mg/dL (ref 6–23)
BUN: 13 mg/dL (ref 6–23)
BUN: 14 mg/dL (ref 6–23)
BUN: 14 mg/dL (ref 6–23)
BUN: 14 mg/dL (ref 6–23)
CO2: 20 mEq/L (ref 19–32)
CO2: 21 mEq/L (ref 19–32)
CO2: 22 mEq/L (ref 19–32)
CO2: 22 mEq/L (ref 19–32)
CO2: 22 mEq/L (ref 19–32)
CO2: 22 mEq/L (ref 19–32)
Calcium: 7 mg/dL — ABNORMAL LOW (ref 8.4–10.5)
Calcium: 7.5 mg/dL — ABNORMAL LOW (ref 8.4–10.5)
Calcium: 7.7 mg/dL — ABNORMAL LOW (ref 8.4–10.5)
Calcium: 7.8 mg/dL — ABNORMAL LOW (ref 8.4–10.5)
Calcium: 7.8 mg/dL — ABNORMAL LOW (ref 8.4–10.5)
Calcium: 7.9 mg/dL — ABNORMAL LOW (ref 8.4–10.5)
Chloride: 107 mEq/L (ref 96–112)
Chloride: 109 mEq/L (ref 96–112)
Chloride: 109 mEq/L (ref 96–112)
Chloride: 111 mEq/L (ref 96–112)
Chloride: 111 mEq/L (ref 96–112)
Chloride: 113 mEq/L — ABNORMAL HIGH (ref 96–112)
Creatinine, Ser: 0.64 mg/dL (ref 0.50–1.35)
Creatinine, Ser: 0.67 mg/dL (ref 0.50–1.35)
Creatinine, Ser: 0.7 mg/dL (ref 0.50–1.35)
Creatinine, Ser: 0.81 mg/dL (ref 0.50–1.35)
Creatinine, Ser: 0.94 mg/dL (ref 0.50–1.35)
Creatinine, Ser: 1.11 mg/dL (ref 0.50–1.35)
GFR calc Af Amer: 79 mL/min — ABNORMAL LOW (ref >90)
GFR calc Af Amer: >90 mL/min (ref >90)
GFR calc Af Amer: >90 mL/min (ref >90)
GFR calc Af Amer: >90 mL/min (ref >90)
GFR calc Af Amer: >90 mL/min (ref >90)
GFR calc Af Amer: >90 mL/min (ref >90)
GFR calc non Af Amer: 68 mL/min — ABNORMAL LOW (ref >90)
GFR calc non Af Amer: 86 mL/min — ABNORMAL LOW (ref >90)
GFR calc non Af Amer: >90 mL/min (ref >90)
GFR calc non Af Amer: >90 mL/min (ref >90)
GFR calc non Af Amer: >90 mL/min (ref >90)
GFR calc non Af Amer: >90 mL/min (ref >90)
Glucose, Bld: 108 mg/dL — ABNORMAL HIGH (ref 70–99)
Glucose, Bld: 108 mg/dL — ABNORMAL HIGH (ref 70–99)
Glucose, Bld: 113 mg/dL — ABNORMAL HIGH (ref 70–99)
Glucose, Bld: 119 mg/dL — ABNORMAL HIGH (ref 70–99)
Glucose, Bld: 119 mg/dL — ABNORMAL HIGH (ref 70–99)
Glucose, Bld: 127 mg/dL — ABNORMAL HIGH (ref 70–99)
Potassium: 3 mEq/L — ABNORMAL LOW (ref 3.5–5.1)
Potassium: 3.5 mEq/L (ref 3.5–5.1)
Potassium: 3.7 mEq/L (ref 3.5–5.1)
Potassium: 3.8 mEq/L (ref 3.5–5.1)
Potassium: 3.8 mEq/L (ref 3.5–5.1)
Potassium: 4.3 mEq/L (ref 3.5–5.1)
Sodium: 139 mEq/L (ref 135–145)
Sodium: 139 mEq/L (ref 135–145)
Sodium: 141 mEq/L (ref 135–145)
Sodium: 143 mEq/L (ref 135–145)
Sodium: 143 mEq/L (ref 135–145)
Sodium: 146 mEq/L — ABNORMAL HIGH (ref 135–145)

## 2013-01-24 LAB — BLOOD GAS, ARTERIAL
Acid-base deficit: 5.5 mmol/L — ABNORMAL HIGH (ref 0.0–2.0)
Acid-base deficit: 4.6 mmol/L — ABNORMAL HIGH (ref 0.0–2.0)
Bicarbonate: 20.1 mEq/L (ref 20.0–24.0)
Bicarbonate: 20.4 mEq/L (ref 20.0–24.0)
Drawn by: 235321
FIO2: 0.9 %
FIO2: 0.5 %
MECHVT: 450 mL
MECHVT: 450 mL
O2 Saturation: 99.2 %
O2 Saturation: 96.8 %
PEEP: 14 cmH2O
PEEP: 8 cmH2O
Patient temperature: 94
Patient temperature: 93.2
RATE: 34 resp/min
RATE: 30 resp/min
TCO2: 21.4 mmol/L (ref 0–100)
TCO2: 21.6 mmol/L (ref 0–100)
pCO2 arterial: 38.6 mmHg (ref 35.0–45.0)
pCO2 arterial: 34.9 mmHg — ABNORMAL LOW (ref 35.0–45.0)
pH, Arterial: 7.319 — ABNORMAL LOW (ref 7.350–7.450)
pH, Arterial: 7.366 (ref 7.350–7.450)
pO2, Arterial: 195 mmHg — ABNORMAL HIGH (ref 80.0–100.0)
pO2, Arterial: 78.8 mmHg — ABNORMAL LOW (ref 80.0–100.0)

## 2013-01-24 LAB — CK TOTAL AND CKMB (NOT AT ARMC)
CK, MB: 72.1 ng/mL (ref 0.3–4.0)
Relative Index: 9.3 — ABNORMAL HIGH (ref 0.0–2.5)
Total CK: 779 U/L — ABNORMAL HIGH (ref 7–232)

## 2013-01-24 LAB — CBC
HCT: 45.9 % (ref 39.0–52.0)
HCT: 49.5 % (ref 39.0–52.0)
HCT: 50.1 % (ref 39.0–52.0)
HCT: 52 % (ref 39.0–52.0)
Hemoglobin: 15.3 g/dL (ref 13.0–17.0)
Hemoglobin: 16.6 g/dL (ref 13.0–17.0)
Hemoglobin: 17.3 g/dL — ABNORMAL HIGH (ref 13.0–17.0)
Hemoglobin: 17.6 g/dL — ABNORMAL HIGH (ref 13.0–17.0)
MCH: 30.1 pg (ref 26.0–34.0)
MCH: 30.5 pg (ref 26.0–34.0)
MCH: 30.9 pg (ref 26.0–34.0)
MCH: 31.1 pg (ref 26.0–34.0)
MCHC: 33.3 g/dL (ref 30.0–36.0)
MCHC: 33.5 g/dL (ref 30.0–36.0)
MCHC: 33.8 g/dL (ref 30.0–36.0)
MCHC: 34.5 g/dL (ref 30.0–36.0)
MCV: 89.7 fL (ref 78.0–100.0)
MCV: 89.9 fL (ref 78.0–100.0)
MCV: 91.4 fL (ref 78.0–100.0)
MCV: 91.6 fL (ref 78.0–100.0)
Platelets: 198 10*3/uL (ref 150–400)
Platelets: 207 10*3/uL (ref 150–400)
Platelets: 225 10*3/uL (ref 150–400)
Platelets: 235 10*3/uL (ref 150–400)
RBC: 5.01 MIL/uL (ref 4.22–5.81)
RBC: 5.52 MIL/uL (ref 4.22–5.81)
RBC: 5.57 MIL/uL (ref 4.22–5.81)
RBC: 5.69 MIL/uL (ref 4.22–5.81)
RDW: 15.4 % (ref 11.5–15.5)
RDW: 15.7 % — ABNORMAL HIGH (ref 11.5–15.5)
RDW: 16.3 % — ABNORMAL HIGH (ref 11.5–15.5)
RDW: 16.3 % — ABNORMAL HIGH (ref 11.5–15.5)
WBC: 16.1 10*3/uL — ABNORMAL HIGH (ref 4.0–10.5)
WBC: 16.1 10*3/uL — ABNORMAL HIGH (ref 4.0–10.5)
WBC: 18.3 10*3/uL — ABNORMAL HIGH (ref 4.0–10.5)
WBC: 18.7 10*3/uL — ABNORMAL HIGH (ref 4.0–10.5)

## 2013-01-24 LAB — TROPONIN I
Troponin I: 1.54 ng/mL (ref <0.30)
Troponin I: 1.81 ng/mL (ref <0.30)
Troponin I: 1.85 ng/mL (ref <0.30)
Troponin I: 2.42 ng/mL (ref <0.30)
Troponin I: 3.28 ng/mL (ref <0.30)

## 2013-01-24 LAB — GLUCOSE, CAPILLARY
Glucose-Capillary: 102 mg/dL — ABNORMAL HIGH (ref 70–99)
Glucose-Capillary: 103 mg/dL — ABNORMAL HIGH (ref 70–99)
Glucose-Capillary: 114 mg/dL — ABNORMAL HIGH (ref 70–99)
Glucose-Capillary: 87 mg/dL (ref 70–99)
Glucose-Capillary: 92 mg/dL (ref 70–99)
Glucose-Capillary: 95 mg/dL (ref 70–99)
Glucose-Capillary: 84 mg/dL (ref 70–99)
Glucose-Capillary: 93 mg/dL (ref 70–99)
Glucose-Capillary: 87 mg/dL (ref 70–99)

## 2013-01-24 LAB — MAGNESIUM: Magnesium: 1.1 mg/dL — ABNORMAL LOW (ref 1.5–2.5)

## 2013-01-24 LAB — PROTIME-INR
INR: 6.1 (ref 0.00–1.49)
Prothrombin Time: 50 seconds — ABNORMAL HIGH (ref 11.6–15.2)

## 2013-01-24 LAB — PATHOLOGIST SMEAR REVIEW

## 2013-01-24 LAB — PHOSPHORUS: Phosphorus: 2.3 mg/dL (ref 2.3–4.6)

## 2013-01-24 MED ORDER — MIDAZOLAM HCL 2 MG/2ML IJ SOLN
2.0000 mg | INTRAMUSCULAR | Status: DC | PRN
  Administered 2013-01-25: 2 mg via INTRAVENOUS
  Filled 2013-01-24: qty 2

## 2013-01-24 MED ORDER — VITAMIN K1 10 MG/ML IJ SOLN
5.0000 mg | Freq: Once | INTRAVENOUS | Status: AC
  Administered 2013-01-24: 5 mg via INTRAVENOUS
  Filled 2013-01-24: qty 0.5

## 2013-01-24 MED ORDER — FENTANYL CITRATE 0.05 MG/ML IJ SOLN
50.0000 ug | INTRAMUSCULAR | Status: DC | PRN
  Administered 2013-01-24: 100 ug via INTRAVENOUS
  Filled 2013-01-24: qty 2

## 2013-01-24 MED ORDER — POTASSIUM CHLORIDE 10 MEQ/50ML IV SOLN
10.0000 meq | INTRAVENOUS | Status: AC
  Administered 2013-01-24 (×2): 10 meq via INTRAVENOUS
  Filled 2013-01-24: qty 150

## 2013-01-24 MED ORDER — PHENYLEPHRINE HCL 10 MG/ML IJ SOLN
30.0000 ug/min | INTRAVENOUS | Status: DC
  Administered 2013-01-24 – 2013-01-25 (×2): 100 ug/min via INTRAVENOUS
  Filled 2013-01-24 (×5): qty 4

## 2013-01-24 MED ORDER — POTASSIUM CHLORIDE 20 MEQ/15ML (10%) PO LIQD: 20.0000 meq | Freq: Once | ORAL | Status: DC

## 2013-01-24 MED ORDER — PHENYLEPHRINE HCL 10 MG/ML IJ SOLN
30.0000 ug/min | INTRAVENOUS | Status: DC
  Administered 2013-01-24: 50 ug/min via INTRAVENOUS
  Administered 2013-01-24: 60 ug/min via INTRAVENOUS
  Filled 2013-01-24 (×2): qty 1

## 2013-01-24 MED ORDER — DEXTROSE 50 % IV SOLN
INTRAVENOUS | Status: AC
  Administered 2013-01-24: 25 mL via INTRAVENOUS
  Filled 2013-01-24: qty 50

## 2013-01-24 MED ORDER — DEXTROSE 50 % IV SOLN
25.0000 mL | Freq: Once | INTRAVENOUS | Status: AC
  Administered 2013-01-24: 25 mL via INTRAVENOUS

## 2013-01-24 MED ORDER — SODIUM CHLORIDE 0.9 % IV BOLUS (SEPSIS)
500.0000 mL | Freq: Once | INTRAVENOUS | Status: AC
  Administered 2013-01-24: 500 mL via INTRAVENOUS

## 2013-01-24 MED ORDER — MAGNESIUM SULFATE 40 MG/ML IJ SOLN
2.0000 g | Freq: Once | INTRAMUSCULAR | Status: AC
  Administered 2013-01-24: 2 g via INTRAVENOUS
  Filled 2013-01-24: qty 50

## 2013-01-24 MED ORDER — NOREPINEPHRINE BITARTRATE 1 MG/ML IJ SOLN
0.5000 ug/min | INTRAMUSCULAR | Status: DC
  Administered 2013-01-24: 10 ug/min via INTRAVENOUS
  Filled 2013-01-24: qty 16

## 2013-01-24 NOTE — Progress Notes (Signed)
Portable EEG completed
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
## 2013-01-24 NOTE — Progress Notes (Signed)
SUBJECTIVE:  Intubated, sedated and cooled.     PHYSICAL EXAM Filed Vitals:   01/24/13 0501 01/24/13 0521 01/24/13 0530 01/24/13 0600  BP: 93/62 85/56 101/58 105/65  Pulse: 71 71 67 65  Temp:    93 F (33.9 C)  TempSrc:    Core (Comment)  Resp:      Height:      Weight:      SpO2: 96% 97% 97% 98%   General:  Intubated and sedated.   Lungs:  Decreased breath sounds Heart:  Distant heart sounds Abdomen:  Absent bowel sounds. Extremities:  Decreased distal pulses improved  LABS: Lab Results  Component Value Date   CKTOTAL 779* 01/24/2013   CKMB 72.1* 01/24/2013   TROPONINI 2.42* 01/24/2013   Results for orders placed during the hospital encounter of 01/22/13 (from the past 24 hour(s))  GLUCOSE, CAPILLARY     Status: Abnormal   Collection Time   01/23/13  8:22 AM      Component Value Range   Glucose-Capillary 118 (*) 70 - 99 mg/dL  CULTURE, BLOOD (ROUTINE X 2)     Status: Normal (Preliminary result)   Collection Time   01/23/13  8:43 AM      Component Value Range   Specimen Description BLOOD RIGHT HAND     Special Requests BOTTLES DRAWN AEROBIC ONLY 3CC     Culture  Setup Time 01/23/2013 15:03     Culture       Value:        BLOOD CULTURE RECEIVED NO GROWTH TO DATE CULTURE WILL BE HELD FOR 5 DAYS BEFORE ISSUING A FINAL NEGATIVE REPORT   Report Status PENDING    CULTURE, BLOOD (ROUTINE X 2)     Status: Normal (Preliminary result)   Collection Time   01/23/13  8:50 AM      Component Value Range   Specimen Description BLOOD LEFT ARM     Special Requests BOTTLES DRAWN AEROBIC ONLY 5CC     Culture  Setup Time 01/23/2013 15:03     Culture       Value:        BLOOD CULTURE RECEIVED NO GROWTH TO DATE CULTURE WILL BE HELD FOR 5 DAYS BEFORE ISSUING A FINAL NEGATIVE REPORT   Report Status PENDING    BASIC METABOLIC PANEL     Status: Abnormal   Collection Time   01/23/13  9:00 AM      Component Value Range   Sodium 143  135 - 145 mEq/L   Potassium 3.6  3.5 - 5.1 mEq/L   Chloride 111  96 - 112 mEq/L   CO2 20  19 - 32 mEq/L   Glucose, Bld 115 (*) 70 - 99 mg/dL   BUN 16  6 - 23 mg/dL   Creatinine, Ser 1.61  0.50 - 1.35 mg/dL   Calcium 7.7 (*) 8.4 - 10.5 mg/dL   GFR calc non Af Amer >90  >90 mL/min   GFR calc Af Amer >90  >90 mL/min  PROTIME-INR     Status: Abnormal   Collection Time   01/23/13  9:00 AM      Component Value Range   Prothrombin Time 38.8 (*) 11.6 - 15.2 seconds   INR 4.33 (*) 0.00 - 1.49  APTT     Status: Abnormal   Collection Time   01/23/13  9:00 AM      Component Value Range   aPTT 43 (*) 24 - 37 seconds  CBC  Status: Abnormal   Collection Time   01/23/13  9:00 AM      Component Value Range   WBC 21.7 (*) 4.0 - 10.5 K/uL   RBC 5.91 (*) 4.22 - 5.81 MIL/uL   Hemoglobin 18.2 (*) 13.0 - 17.0 g/dL   HCT 91.4 (*) 78.2 - 95.6 %   MCV 91.4  78.0 - 100.0 fL   MCH 30.8  26.0 - 34.0 pg   MCHC 33.7  30.0 - 36.0 g/dL   RDW 21.3 (*) 08.6 - 57.8 %   Platelets 264  150 - 400 K/uL  HEMOGLOBIN A1C     Status: Abnormal   Collection Time   01/23/13  9:00 AM      Component Value Range   Hemoglobin A1C 5.8 (*) <5.7 %   Mean Plasma Glucose 120 (*) <117 mg/dL  TSH     Status: Normal   Collection Time   01/23/13  9:00 AM      Component Value Range   TSH 1.154  0.350 - 4.500 uIU/mL  CK TOTAL AND CKMB     Status: Abnormal   Collection Time   01/23/13  9:00 AM      Component Value Range   Total CK 811 (*) 7 - 232 U/L   CK, MB 81.8 (*) 0.3 - 4.0 ng/mL   Relative Index 10.1 (*) 0.0 - 2.5  LACTIC ACID, PLASMA     Status: Abnormal   Collection Time   01/23/13 10:25 AM      Component Value Range   Lactic Acid, Venous 2.4 (*) 0.5 - 2.2 mmol/L  GLUCOSE, CAPILLARY     Status: Abnormal   Collection Time   01/23/13 10:28 AM      Component Value Range   Glucose-Capillary 103 (*) 70 - 99 mg/dL  GLUCOSE, CAPILLARY     Status: Abnormal   Collection Time   01/23/13 12:22 PM      Component Value Range   Glucose-Capillary 102 (*) 70 - 99 mg/dL  TROPONIN  I     Status: Abnormal   Collection Time   01/23/13  1:00 PM      Component Value Range   Troponin I 10.14 (*) <0.30 ng/mL  BASIC METABOLIC PANEL     Status: Abnormal   Collection Time   01/23/13  1:00 PM      Component Value Range   Sodium 143  135 - 145 mEq/L   Potassium 4.1  3.5 - 5.1 mEq/L   Chloride 112  96 - 112 mEq/L   CO2 18 (*) 19 - 32 mEq/L   Glucose, Bld 124 (*) 70 - 99 mg/dL   BUN 16  6 - 23 mg/dL   Creatinine, Ser 4.69  0.50 - 1.35 mg/dL   Calcium 7.5 (*) 8.4 - 10.5 mg/dL   GFR calc non Af Amer >90  >90 mL/min   GFR calc Af Amer >90  >90 mL/min  POCT I-STAT, CHEM 8     Status: Abnormal   Collection Time   01/23/13  2:58 PM      Component Value Range   Sodium 148 (*) 135 - 145 mEq/L   Potassium 3.6  3.5 - 5.1 mEq/L   Chloride 113 (*) 96 - 112 mEq/L   BUN 14  6 - 23 mg/dL   Creatinine, Ser 6.29  0.50 - 1.35 mg/dL   Glucose, Bld 528 (*) 70 - 99 mg/dL   Calcium, Ion 4.13 (*) 1.13 - 1.30 mmol/L  TCO2 19  0 - 100 mmol/L   Hemoglobin 20.4 (*) 13.0 - 17.0 g/dL   HCT 16.1 (*) 09.6 - 04.5 %  CK TOTAL AND CKMB     Status: Abnormal   Collection Time   01/23/13  4:04 PM      Component Value Range   Total CK 943 (*) 7 - 232 U/L   CK, MB 90.3 (*) 0.3 - 4.0 ng/mL   Relative Index 9.6 (*) 0.0 - 2.5  TROPONIN I     Status: Abnormal   Collection Time   01/23/13  4:04 PM      Component Value Range   Troponin I 7.11 (*) <0.30 ng/mL  GLUCOSE, CAPILLARY     Status: Abnormal   Collection Time   01/23/13  4:05 PM      Component Value Range   Glucose-Capillary 103 (*) 70 - 99 mg/dL  CBC     Status: Abnormal   Collection Time   01/23/13  4:30 PM      Component Value Range   WBC 16.5 (*) 4.0 - 10.5 K/uL   RBC 5.73  4.22 - 5.81 MIL/uL   Hemoglobin 17.2 (*) 13.0 - 17.0 g/dL   HCT 40.9  81.1 - 91.4 %   MCV 90.4  78.0 - 100.0 fL   MCH 30.0  26.0 - 34.0 pg   MCHC 33.2  30.0 - 36.0 g/dL   RDW 78.2 (*) 95.6 - 21.3 %   Platelets 241  150 - 400 K/uL  BASIC METABOLIC PANEL     Status:  Abnormal   Collection Time   01/23/13  4:30 PM      Component Value Range   Sodium 145  135 - 145 mEq/L   Potassium 3.7  3.5 - 5.1 mEq/L   Chloride 113 (*) 96 - 112 mEq/L   CO2 19  19 - 32 mEq/L   Glucose, Bld 133 (*) 70 - 99 mg/dL   BUN 15  6 - 23 mg/dL   Creatinine, Ser 0.86  0.50 - 1.35 mg/dL   Calcium 7.7 (*) 8.4 - 10.5 mg/dL   GFR calc non Af Amer >90  >90 mL/min   GFR calc Af Amer >90  >90 mL/min  GLUCOSE, CAPILLARY     Status: Abnormal   Collection Time   01/23/13  6:21 PM      Component Value Range   Glucose-Capillary 114 (*) 70 - 99 mg/dL  GLUCOSE, CAPILLARY     Status: Normal   Collection Time   01/23/13  7:41 PM      Component Value Range   Glucose-Capillary 92  70 - 99 mg/dL  BASIC METABOLIC PANEL     Status: Abnormal   Collection Time   01/23/13  9:05 PM      Component Value Range   Sodium 141  135 - 145 mEq/L   Potassium 3.6  3.5 - 5.1 mEq/L   Chloride 110  96 - 112 mEq/L   CO2 18 (*) 19 - 32 mEq/L   Glucose, Bld 119 (*) 70 - 99 mg/dL   BUN 15  6 - 23 mg/dL   Creatinine, Ser 5.78  0.50 - 1.35 mg/dL   Calcium 7.7 (*) 8.4 - 10.5 mg/dL   GFR calc non Af Amer >90  >90 mL/min   GFR calc Af Amer >90  >90 mL/min  GLUCOSE, CAPILLARY     Status: Normal   Collection Time   01/23/13 10:57 PM  Component Value Range   Glucose-Capillary 87  70 - 99 mg/dL  TROPONIN I     Status: Abnormal   Collection Time   01/23/13 11:30 PM      Component Value Range   Troponin I 3.28 (*) <0.30 ng/mL  CBC     Status: Abnormal   Collection Time   01/23/13 11:35 PM      Component Value Range   WBC 16.1 (*) 4.0 - 10.5 K/uL   RBC 5.57  4.22 - 5.81 MIL/uL   Hemoglobin 17.3 (*) 13.0 - 17.0 g/dL   HCT 29.5  62.1 - 30.8 %   MCV 89.9  78.0 - 100.0 fL   MCH 31.1  26.0 - 34.0 pg   MCHC 34.5  30.0 - 36.0 g/dL   RDW 65.7  84.6 - 96.2 %   Platelets 235  150 - 400 K/uL  CK TOTAL AND CKMB     Status: Abnormal   Collection Time   01/24/13 12:04 AM      Component Value Range   Total CK 779  (*) 7 - 232 U/L   CK, MB 72.1 (*) 0.3 - 4.0 ng/mL   Relative Index 9.3 (*) 0.0 - 2.5  BASIC METABOLIC PANEL     Status: Abnormal   Collection Time   01/24/13 12:04 AM      Component Value Range   Sodium 141  135 - 145 mEq/L   Potassium 3.0 (*) 3.5 - 5.1 mEq/L   Chloride 109  96 - 112 mEq/L   CO2 20  19 - 32 mEq/L   Glucose, Bld 108 (*) 70 - 99 mg/dL   BUN 14  6 - 23 mg/dL   Creatinine, Ser 9.52  0.50 - 1.35 mg/dL   Calcium 7.0 (*) 8.4 - 10.5 mg/dL   GFR calc non Af Amer >90  >90 mL/min   GFR calc Af Amer >90  >90 mL/min  GLUCOSE, CAPILLARY     Status: Abnormal   Collection Time   01/24/13 12:05 AM      Component Value Range   Glucose-Capillary 102 (*) 70 - 99 mg/dL  GLUCOSE, CAPILLARY     Status: Normal   Collection Time   01/24/13  4:40 AM      Component Value Range   Glucose-Capillary 95  70 - 99 mg/dL  TROPONIN I     Status: Abnormal   Collection Time   01/24/13  5:05 AM      Component Value Range   Troponin I 2.42 (*) <0.30 ng/mL  MAGNESIUM     Status: Abnormal   Collection Time   01/24/13  5:48 AM      Component Value Range   Magnesium 1.1 (*) 1.5 - 2.5 mg/dL  PHOSPHORUS     Status: Normal   Collection Time   01/24/13  5:48 AM      Component Value Range   Phosphorus 2.3  2.3 - 4.6 mg/dL  BASIC METABOLIC PANEL     Status: Abnormal   Collection Time   01/24/13  5:48 AM      Component Value Range   Sodium 146 (*) 135 - 145 mEq/L   Potassium 3.8  3.5 - 5.1 mEq/L   Chloride 113 (*) 96 - 112 mEq/L   CO2 22  19 - 32 mEq/L   Glucose, Bld 108 (*) 70 - 99 mg/dL   BUN 14  6 - 23 mg/dL   Creatinine, Ser 8.41  0.50 - 1.35  mg/dL   Calcium 7.8 (*) 8.4 - 10.5 mg/dL   GFR calc non Af Amer >90  >90 mL/min   GFR calc Af Amer >90  >90 mL/min  CBC     Status: Abnormal   Collection Time   01/24/13  7:35 AM      Component Value Range   WBC 16.1 (*) 4.0 - 10.5 K/uL   RBC 5.52  4.22 - 5.81 MIL/uL   Hemoglobin 16.6  13.0 - 17.0 g/dL   HCT 04.5  40.9 - 81.1 %   MCV 89.7  78.0 -  100.0 fL   MCH 30.1  26.0 - 34.0 pg   MCHC 33.5  30.0 - 36.0 g/dL   RDW 91.4 (*) 78.2 - 95.6 %   Platelets 207  150 - 400 K/uL    Intake/Output Summary (Last 24 hours) at 01/24/13 0800 Last data filed at 01/24/13 0600  Gross per 24 hour  Intake 3025.27 ml  Output   1360 ml  Net 1665.27 ml    EKG:  Sinus tachycardia, rate 101, RAD, old anteroseptal MI.  Non specific diffuse ST T wave changes.    ASSESSMENT AND PLAN:  Cardiac arrest:  Etiology unclear.  Echo with reduced EF.  No evidence of PE or dissection on CT.  Troponin trending down.  Suspect arrhythmia related to acute coronary event.  He will need cath eventually pending his neuro work up.  He has had bleeding from the NG tube and an elevated INR so he is currently not on ASA or heparin. Check an EKG today.  I would like to use heparin if his INR is less than two and we think he is not having active blood in his NG.  There wasn't any reported today.    History of clotting disorder.  He has been on warfarin in the past.    INR pending today.    Travis Cruz 01/24/2013 8:00 AM

## 2013-01-24 NOTE — Progress Notes (Signed)
25ml Versed & 40ml Fentanyl Wasted per protocol.
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
## 2013-01-24 NOTE — Progress Notes (Signed)
eLink Physician-Brief Progress Note Patient Name: Travis Cruz DOB: [birth year] MRN: [id number]  Date of Service  01/24/2013   HPI/Events of Note  Hypokalemia  eICU Interventions  Potassium replaced   Intervention Category Intermediate Interventions: Electrolyte abnormality - evaluation and management  [name] 01/24/2013, 12:59 AM

## 2013-01-24 NOTE — Progress Notes (Signed)
UR COMPLETED  

## 2013-01-24 NOTE — Procedures (Signed)
History: 67 yo M with induced hypothermia 2/2 Vfinb arrest, concern for seizure in ED.   Sedation: None  Background: The background consists of low amplitude irregular delta intermixed with low amplitude alpha activities, though no clear PDR was was seen. There is an increased in teh amplitude of the delta activity following painful stimulation consistent with a reactive EEG.   Photic stimulation: Physiologic driving is not performed.   EEG Abnormalities: 1) Generalized irregular slow activity.   Clinical Interpretation: This EEG is consistent with a mild - moderate generalized non-specific cerebral dysfunction. There was no seizure or seizure predisposition recorded on this study.   Ritta Slot, MD [facility] [phone]  If 7pm- 7am, please page neurology on call at [phone].

## 2013-01-24 NOTE — Progress Notes (Signed)
Name: Jobani Sabado MRN: [id number] DOB: Sep 25, 1946  ELECTRONIC ICU PHYSICIAN NOTE  Problem:  Hypotension, tachycardia on levophed drip   Intake/Output Summary (Last 24 hours) at 01/24/13 1725 Last data filed at 01/24/13 1615  Gross per 24 hour  Intake 3469.7 ml  Output    995 ml  Net 2474.7 ml     CVP:  [5 mmHg-14 mmHg] 9 mmHg   Lab 01/24/13 1638 01/24/13 0735 01/23/13 2335  HGB 17.6* 16.6 17.3*     Lab 01/24/13 1305 01/24/13 0830 01/24/13 0548  NA 139 143 146*  K 3.7 3.5 3.8  CL 107 111 113*  CO2 22 21 22   BUN 13 14 14   CREATININE 0.81 0.70 0.67  GLUCOSE 119* 119* 108*      Intervention:  Already on low dose Beta blockers so try off levophed, on neo and ns bolus x 500 cc x 2 as polycythemia still suggest relatively dry.  Sandrea Hughs 01/24/2013, 5:24 PM

## 2013-01-25 ENCOUNTER — Inpatient Hospital Stay (HOSPITAL_COMMUNITY): Payer: BC Managed Care – PPO

## 2013-01-25 LAB — PHOSPHORUS: Phosphorus: 2.5 mg/dL (ref 2.3–4.6)

## 2013-01-25 LAB — GLUCOSE, CAPILLARY
Glucose-Capillary: 68 mg/dL — ABNORMAL LOW (ref 70–99)
Glucose-Capillary: 107 mg/dL — ABNORMAL HIGH (ref 70–99)
Glucose-Capillary: 124 mg/dL — ABNORMAL HIGH (ref 70–99)
Glucose-Capillary: 63 mg/dL — ABNORMAL LOW (ref 70–99)
Glucose-Capillary: 94 mg/dL (ref 70–99)
Glucose-Capillary: 114 mg/dL — ABNORMAL HIGH (ref 70–99)
Glucose-Capillary: 62 mg/dL — ABNORMAL LOW (ref 70–99)
Glucose-Capillary: 67 mg/dL — ABNORMAL LOW (ref 70–99)
Glucose-Capillary: 83 mg/dL (ref 70–99)
Glucose-Capillary: 59 mg/dL — ABNORMAL LOW (ref 70–99)
Glucose-Capillary: 87 mg/dL (ref 70–99)

## 2013-01-25 LAB — BASIC METABOLIC PANEL
BUN: 12 mg/dL (ref 6–23)
CO2: 20 mEq/L (ref 19–32)
Calcium: 7.8 mg/dL — ABNORMAL LOW (ref 8.4–10.5)
Chloride: 112 mEq/L (ref 96–112)
Creatinine, Ser: 1.1 mg/dL (ref 0.50–1.35)
GFR calc Af Amer: 80 mL/min — ABNORMAL LOW (ref >90)
GFR calc non Af Amer: 69 mL/min — ABNORMAL LOW (ref >90)
Glucose, Bld: 107 mg/dL — ABNORMAL HIGH (ref 70–99)
Potassium: 3.9 mEq/L (ref 3.5–5.1)
Sodium: 143 mEq/L (ref 135–145)

## 2013-01-25 LAB — POCT I-STAT 3, VENOUS BLOOD GAS (G3P V)
Acid-base deficit: 4 mmol/L — ABNORMAL HIGH (ref 0.0–2.0)
Bicarbonate: 22.3 mEq/L (ref 20.0–24.0)
O2 Saturation: 86 %
Patient temperature: 37.8
TCO2: 24 mmol/L (ref 0–100)
pCO2, Ven: 46.8 mmHg (ref 45.0–50.0)
pH, Ven: 7.29 (ref 7.250–7.300)
pO2, Ven: 61 mmHg — ABNORMAL HIGH (ref 30.0–45.0)

## 2013-01-25 LAB — CBC
HCT: 46.2 % (ref 39.0–52.0)
Hemoglobin: 14.9 g/dL (ref 13.0–17.0)
MCH: 29.3 pg (ref 26.0–34.0)
MCHC: 32.3 g/dL (ref 30.0–36.0)
MCV: 90.8 fL (ref 78.0–100.0)
Platelets: 194 10*3/uL (ref 150–400)
RBC: 5.09 MIL/uL (ref 4.22–5.81)
RDW: 16.3 % — ABNORMAL HIGH (ref 11.5–15.5)
WBC: 16.8 10*3/uL — ABNORMAL HIGH (ref 4.0–10.5)

## 2013-01-25 LAB — MAGNESIUM: Magnesium: 1.3 mg/dL — ABNORMAL LOW (ref 1.5–2.5)

## 2013-01-25 LAB — TROPONIN I: Troponin I: 1.37 ng/mL (ref <0.30)

## 2013-01-25 LAB — PROTIME-INR
INR: 1.63 — ABNORMAL HIGH (ref 0.00–1.49)
Prothrombin Time: 18.8 seconds — ABNORMAL HIGH (ref 11.6–15.2)

## 2013-01-25 LAB — HEPARIN LEVEL (UNFRACTIONATED): Heparin Unfractionated: 0.13 IU/mL — ABNORMAL LOW (ref 0.30–0.70)

## 2013-01-25 MED ORDER — ASPIRIN EC 81 MG PO TBEC: 81.0000 mg | DELAYED_RELEASE_TABLET | Freq: Every day | ORAL | Status: DC

## 2013-01-25 MED ORDER — PRO-STAT SUGAR FREE PO LIQD
30.0000 mL | Freq: Three times a day (TID) | ORAL | Status: DC
  Administered 2013-01-25 – 2013-01-26 (×2): 30 mL via ORAL
  Filled 2013-01-25 (×8): qty 30

## 2013-01-25 MED ORDER — DEXTROSE 50 % IV SOLN: 25.0000 mL | Freq: Once | INTRAVENOUS | Status: DC

## 2013-01-25 MED ORDER — DEXTROSE 50 % IV SOLN: 25.0000 mL | Freq: Once | INTRAVENOUS | Status: DC | PRN

## 2013-01-25 MED ORDER — HEPARIN (PORCINE) IN NACL 100-0.45 UNIT/ML-% IJ SOLN
1650.0000 [IU]/h | INTRAMUSCULAR | Status: DC
  Administered 2013-01-26 – 2013-01-29 (×6): 1750 [IU]/h via INTRAVENOUS
  Administered 2013-01-30: 1700 [IU]/h via INTRAVENOUS
  Administered 2013-01-31: 1650 [IU]/h via INTRAVENOUS
  Filled 2013-01-25 (×11): qty 250

## 2013-01-25 MED ORDER — PROPOFOL 10 MG/ML IV EMUL
5.0000 ug/kg/min | INTRAVENOUS | Status: DC
  Administered 2013-01-25 (×2): 10 ug/kg/min via INTRAVENOUS
  Administered 2013-01-26: 15 ug/kg/min via INTRAVENOUS
  Filled 2013-01-25 (×2): qty 100

## 2013-01-25 MED ORDER — PROPOFOL 10 MG/ML IV EMUL
INTRAVENOUS | Status: AC
  Administered 2013-01-25: 10 ug/kg/min via INTRAVENOUS
  Filled 2013-01-25: qty 100

## 2013-01-25 MED ORDER — ASPIRIN 81 MG PO CHEW
81.0000 mg | CHEWABLE_TABLET | Freq: Every day | ORAL | Status: DC
  Administered 2013-01-25: 81 mg via ORAL
  Filled 2013-01-25: qty 1

## 2013-01-25 MED ORDER — DEXTROSE 50 % IV SOLN
25.0000 mL | Freq: Once | INTRAVENOUS | Status: AC | PRN
  Administered 2013-01-25: 25 mL via INTRAVENOUS
  Filled 2013-01-25: qty 50

## 2013-01-25 MED ORDER — HEPARIN SOD (PORCINE) IN D5W 100 UNIT/ML IV SOLN
INTRAVENOUS | Status: AC
  Administered 2013-01-25: 1500 [IU]/h via INTRAVENOUS
  Filled 2013-01-25: qty 250

## 2013-01-25 MED ORDER — HEPARIN (PORCINE) IN NACL 100-0.45 UNIT/ML-% IJ SOLN
1500.0000 [IU]/h | INTRAMUSCULAR | Status: DC
  Administered 2013-01-25: 1500 [IU]/h via INTRAVENOUS
  Filled 2013-01-25 (×2): qty 250

## 2013-01-25 MED ORDER — FENTANYL CITRATE 0.05 MG/ML IJ SOLN
50.0000 ug | INTRAMUSCULAR | Status: DC | PRN
  Administered 2013-01-25: 50 ug via INTRAVENOUS
  Administered 2013-01-25: 100 ug via INTRAVENOUS
  Administered 2013-01-26 – 2013-01-27 (×6): 50 ug via INTRAVENOUS
  Filled 2013-01-25 (×9): qty 2

## 2013-01-25 MED ORDER — DEXTROSE 5 % IV SOLN
INTRAVENOUS | Status: DC
  Administered 2013-01-25: 22:00:00 via INTRAVENOUS

## 2013-01-25 MED ORDER — DEXTROSE 50 % IV SOLN
INTRAVENOUS | Status: AC
  Administered 2013-01-25: 25 mL
  Filled 2013-01-25: qty 50

## 2013-01-25 MED ORDER — MAGNESIUM SULFATE 40 MG/ML IJ SOLN
4.0000 g | Freq: Once | INTRAMUSCULAR | Status: AC
  Administered 2013-01-25: 4 g via INTRAVENOUS
  Filled 2013-01-25: qty 100

## 2013-01-25 MED ORDER — PANTOPRAZOLE SODIUM 40 MG IV SOLR
40.0000 mg | Freq: Two times a day (BID) | INTRAVENOUS | Status: DC
  Administered 2013-01-25 – 2013-01-26 (×4): 40 mg via INTRAVENOUS
  Filled 2013-01-25 (×7): qty 40
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
## 2013-01-25 NOTE — Progress Notes (Signed)
eLink Physician-Brief Progress Note Patient Name: Travis Cruz DOB: 1946/08/03 MRN: [id number]  Date of Service  01/25/2013   HPI/Events of Note   Magnesium of 1.3  eICU Interventions  Magnesium replaced   Intervention Category Intermediate Interventions: Electrolyte abnormality - evaluation and management  Roberta Kelly 01/25/2013, 5:07 AM

## 2013-01-25 NOTE — Progress Notes (Signed)
MD placed patient on 15/5. Decreased PS to 5- Will obtain an ABG in 30 minutes

## 2013-01-25 NOTE — Progress Notes (Signed)
Hypoglycemia   D5 started

## 2013-01-25 NOTE — Progress Notes (Signed)
ANTICOAGULATION CONSULT NOTE - Initial Consult  Pharmacy Consult for heparin Indication: pulmonary embolus, DVT and blood clotting disorder  Patient Measurements: Height: 5\' 10"  (177.8 cm) Weight: 216 lb 0.8 oz (98 kg) IBW/kg (Calculated) : 73   Vital Signs: Temp: 100.2 F (37.9 C) (01/28 1500) Temp src: Core (Comment) (01/28 1200) BP: 102/60 mmHg (01/28 1500) Pulse Rate: 107  (01/28 1500)  Labs:  Basename 01/25/13 0500 01/25/13 0420 01/24/13 2330 01/24/13 1638 01/24/13 0830 01/24/13 0004 01/23/13 1604 01/23/13 0900 01/23/13 0105 01/22/13 2359  HGB -- 14.9 15.3 -- -- -- -- -- -- --  HCT -- 46.2 45.9 52.0 -- -- -- -- -- --  PLT -- 194 198 225 -- -- -- -- -- --  APTT -- -- -- -- -- -- -- 43* 42* 43*  LABPROT -- 18.8* -- -- 50.0* -- -- 38.8* -- --  INR -- 1.63* -- -- 6.10* -- -- 4.33* -- --  HEPARINUNFRC -- -- -- -- -- -- -- -- -- --  CREATININE 1.10 -- 1.11 0.94 -- -- -- -- -- --  CKTOTAL -- -- -- -- -- 779* 943* 811* -- --  CKMB -- -- -- -- -- 72.1* 90.3* 81.8* -- --  TROPONINI -- 1.37* 1.81* 1.54* -- -- -- -- -- --    Estimated Creatinine Clearance: 79.6 ml/min (by C-G formula based on Cr of 1.1).   Medical History: Past Medical History  Diagnosis Date  . COPD (chronic obstructive pulmonary disease)     Emphysema / Non-oxygen dependent  . Hypertension   . Seizures 2012    following second stroke  . Stroke 2011, 2012  . Pulmonary embolism 2009    in setting of prolonged hospitalization  . DVT (deep venous thrombosis) 2009    in setting of prolonged hospitalization  . OSA (obstructive sleep apnea)     severe, on nocturnal BiPAP  . Patent foramen ovale     Refused repair. On chronic coumadin.   Assessment: 67 yo male admitted post-cardiac arrest and now s/p therapeutic hypothermia. Patient with h/o of PE, DVT on Coumadin prior to admission. INR on admit of 3.93. INR on 1/27 was 6.1, and patient then received 5mg  IV phytonadione x 1. INR this AM was 1.63 - pharmacy  to manage IV heparin. Per RN, patient with still some bleed from ET tube when suctioned. Bleeding/clots look to be old, dark brown not bright red. Double verified with cardiology, benefits outweigh risks for anticoagulation. Will start IV heparin this morning without bolus.  MD would like to aim for low range of heparin goal given recent bleeding.  Goal of Therapy:  Heparin level 0.3-0.5 units/ml Monitor platelets by anticoagulation protocol: Yes   Plan:  Start heparin infusion at 1500 units/hr Check anti-Xa level in 6 hours and daily while on heparin Continue to monitor H&H and platelets  Severiano Gilbert 01/25/2013,3:42 PM

## 2013-01-25 NOTE — Progress Notes (Signed)
Nursing: Fentanyl 250 ml wasted down sink. Witnessed by this RN and Teaching laboratory technician.

## 2013-01-25 NOTE — Progress Notes (Signed)
eLink Physician-Brief Progress Note Patient Name: Travis Cruz DOB: [birth year] MRN: [id number]  Date of Service  01/25/2013   HPI/Events of Note  Number of care issues reviewed with bedside nurse. 1. Patient on free water with corrected Na 2. Patient remains agitated on current sedation regimen of F/V 3. Patient with bloody NGT secretions on the 24th but stable Hgb for several days.   eICU Interventions  Plan; 1. D/C free water - monitor Na 2. Change sedation to propofol with prn fentanyl 3. D/C q8 hour CBC - monitor NGT drainage and daily CBC   Intervention Category Minor Interventions: Routine modifications to care plan (e.g. PRN medications for pain, fever);Agitation / anxiety - evaluation and management  DETERDING,ELIZABETH 01/25/2013, 1:46 AM

## 2013-01-25 NOTE — Progress Notes (Addendum)
ANTICOAGULATION CONSULT NOTE - Follow-Up Consult  Pharmacy Consult for heparin Indication: pulmonary embolus, DVT and blood clotting disorder  Patient Measurements: Height: 5\' 10"  (177.8 cm) Weight: 216 lb 0.8 oz (98 kg) IBW/kg (Calculated) : 73   Vital Signs: Temp: 99.9 F (37.7 C) (01/28 1916) Temp src: Core (Comment) (01/28 1200) BP: 88/52 mmHg (01/28 1900) Pulse Rate: 90  (01/28 1916)  Labs:  Basename 01/25/13 1930 01/25/13 0500 01/25/13 0420 01/24/13 2330 01/24/13 1638 01/24/13 0830 01/24/13 0004 01/23/13 1604 01/23/13 0900 01/23/13 0105 01/22/13 2359  HGB -- -- 14.9 15.3 -- -- -- -- -- -- --  HCT -- -- 46.2 45.9 52.0 -- -- -- -- -- --  PLT -- -- 194 198 225 -- -- -- -- -- --  APTT -- -- -- -- -- -- -- -- 43* 42* 43*  LABPROT -- -- 18.8* -- -- 50.0* -- -- 38.8* -- --  INR -- -- 1.63* -- -- 6.10* -- -- 4.33* -- --  HEPARINUNFRC 0.13* -- -- -- -- -- -- -- -- -- --  CREATININE -- 1.10 -- 1.11 0.94 -- -- -- -- -- --  CKTOTAL -- -- -- -- -- -- 779* 943* 811* -- --  CKMB -- -- -- -- -- -- 72.1* 90.3* 81.8* -- --  TROPONINI -- -- 1.37* 1.81* 1.54* -- -- -- -- -- --    Estimated Creatinine Clearance: 79.6 ml/min (by C-G formula based on Cr of 1.1).   Medical History: Past Medical History  Diagnosis Date  . COPD (chronic obstructive pulmonary disease)     Emphysema / Non-oxygen dependent  . Hypertension   . Seizures 2012    following second stroke  . Stroke 2011, 2012  . Pulmonary embolism 2009    in setting of prolonged hospitalization  . DVT (deep venous thrombosis) 2009    in setting of prolonged hospitalization  . OSA (obstructive sleep apnea)     severe, on nocturnal BiPAP  . Patent foramen ovale     Refused repair. On chronic coumadin.   Assessment: 67 yo male admitted post-cardiac arrest and now s/p therapeutic hypothermia. Patient with h/o of PE, DVT on Coumadin prior to admission. INR on admit of 3.93. INR on 1/27 was 6.1, and patient then received 5mg  IV  phytonadione x 1. INR this AM was 1.63 - pharmacy to manage IV heparin. Per RN, patient with still some bleed from ET tube when suctioned. Bleeding/clots look to be old, dark brown not bright red. Double verified with cardiology, benefits outweigh risks for anticoagulation. IV heparin started this morning without bolus.  MD would like to aim for low range of heparin goal given recent bleeding.  Initial heparin level subtherapeutic at 0.13.  Spoke with RN, no bleeding noted currently.  Goal of Therapy:  Heparin level 0.3-0.5 units/ml Monitor platelets by anticoagulation protocol: Yes   Plan:  Increase IV heparin to 1750 units/hr. Check a heparin level and CBC with AM labs.  Njeri Vicente C 01/25/2013,8:35 PM

## 2013-01-25 NOTE — Progress Notes (Signed)
SUBJECTIVE:  Intubated.  Opens eyes and responds.     PHYSICAL EXAM Filed Vitals:   01/25/13 0300 01/25/13 0400 01/25/13 0500 01/25/13 0600  BP:  97/57 89/54 99/64   Pulse: 109 79 78 78  Temp: 100.8 F (38.2 C) 100 F (37.8 C) 99.9 F (37.7 C) 99.9 F (37.7 C)  TempSrc:  Core (Comment)    Resp: 28 30 30 16   Height:      Weight:      SpO2: 96% 98% 98% 98%   General:  Intubated and sedated.   Lungs:  Decreased breath sounds Heart:  Distant heart sounds Abdomen:  Absent bowel sounds. Extremities:  Diffuse edema  LABS: Lab Results  Component Value Date   CKTOTAL 779* 01/24/2013   CKMB 72.1* 01/24/2013   TROPONINI 1.37* 01/25/2013   Results for orders placed during the hospital encounter of 01/22/13 (from the past 24 hour(s))  CBC     Status: Abnormal   Collection Time   01/24/13  7:35 AM      Component Value Range   WBC 16.1 (*) 4.0 - 10.5 K/uL   RBC 5.52  4.22 - 5.81 MIL/uL   Hemoglobin 16.6  13.0 - 17.0 g/dL   HCT 40.9  81.1 - 91.4 %   MCV 89.7  78.0 - 100.0 fL   MCH 30.1  26.0 - 34.0 pg   MCHC 33.5  30.0 - 36.0 g/dL   RDW 78.2 (*) 95.6 - 21.3 %   Platelets 207  150 - 400 K/uL  GLUCOSE, CAPILLARY     Status: Normal   Collection Time   01/24/13  7:49 AM      Component Value Range   Glucose-Capillary 84  70 - 99 mg/dL  BASIC METABOLIC PANEL     Status: Abnormal   Collection Time   01/24/13  8:30 AM      Component Value Range   Sodium 143  135 - 145 mEq/L   Potassium 3.5  3.5 - 5.1 mEq/L   Chloride 111  96 - 112 mEq/L   CO2 21  19 - 32 mEq/L   Glucose, Bld 119 (*) 70 - 99 mg/dL   BUN 14  6 - 23 mg/dL   Creatinine, Ser 0.86  0.50 - 1.35 mg/dL   Calcium 7.8 (*) 8.4 - 10.5 mg/dL   GFR calc non Af Amer >90  >90 mL/min   GFR calc Af Amer >90  >90 mL/min  PROTIME-INR     Status: Abnormal   Collection Time   01/24/13  8:30 AM      Component Value Range   Prothrombin Time 50.0 (*) 11.6 - 15.2 seconds   INR 6.10 (*) 0.00 - 1.49  BLOOD GAS, ARTERIAL     Status:  Abnormal   Collection Time   01/24/13  8:35 AM      Component Value Range   FIO2 0.50     Delivery systems VENTILATOR     Mode PRESSURE REGULATED VOLUME CONTROL     VT 450     Rate 30     Peep/cpap 8.0     pH, Arterial 7.366  7.350 - 7.450   pCO2 arterial 34.9 (*) 35.0 - 45.0 mmHg   pO2, Arterial 78.8 (*) 80.0 - 100.0 mmHg   Bicarbonate 20.4  20.0 - 24.0 mEq/L   TCO2 21.6  0 - 100 mmol/L   Acid-base deficit 4.6 (*) 0.0 - 2.0 mmol/L   O2 Saturation 96.8  Patient temperature 93.2     Collection site A-LINE     Drawn by COLLECTED BY NURSE     Sample type ARTERIAL DRAW    TROPONIN I     Status: Abnormal   Collection Time   01/24/13 11:30 AM      Component Value Range   Troponin I 1.85 (*) <0.30 ng/mL  GLUCOSE, CAPILLARY     Status: Normal   Collection Time   01/24/13 11:33 AM      Component Value Range   Glucose-Capillary 93  70 - 99 mg/dL  BASIC METABOLIC PANEL     Status: Abnormal   Collection Time   01/24/13  1:05 PM      Component Value Range   Sodium 139  135 - 145 mEq/L   Potassium 3.7  3.5 - 5.1 mEq/L   Chloride 107  96 - 112 mEq/L   CO2 22  19 - 32 mEq/L   Glucose, Bld 119 (*) 70 - 99 mg/dL   BUN 13  6 - 23 mg/dL   Creatinine, Ser 7.82  0.50 - 1.35 mg/dL   Calcium 7.7 (*) 8.4 - 10.5 mg/dL   GFR calc non Af Amer >90  >90 mL/min   GFR calc Af Amer >90  >90 mL/min  GLUCOSE, CAPILLARY     Status: Normal   Collection Time   01/24/13  3:20 PM      Component Value Range   Glucose-Capillary 87  70 - 99 mg/dL  TROPONIN I     Status: Abnormal   Collection Time   01/24/13  4:38 PM      Component Value Range   Troponin I 1.54 (*) <0.30 ng/mL  CBC     Status: Abnormal   Collection Time   01/24/13  4:38 PM      Component Value Range   WBC 18.3 (*) 4.0 - 10.5 K/uL   RBC 5.69  4.22 - 5.81 MIL/uL   Hemoglobin 17.6 (*) 13.0 - 17.0 g/dL   HCT 95.6  21.3 - 08.6 %   MCV 91.4  78.0 - 100.0 fL   MCH 30.9  26.0 - 34.0 pg   MCHC 33.8  30.0 - 36.0 g/dL   RDW 57.8 (*) 46.9 -  15.5 %   Platelets 225  150 - 400 K/uL  BASIC METABOLIC PANEL     Status: Abnormal   Collection Time   01/24/13  4:38 PM      Component Value Range   Sodium 143  135 - 145 mEq/L   Potassium 4.3  3.5 - 5.1 mEq/L   Chloride 111  96 - 112 mEq/L   CO2 22  19 - 32 mEq/L   Glucose, Bld 113 (*) 70 - 99 mg/dL   BUN 13  6 - 23 mg/dL   Creatinine, Ser 6.29  0.50 - 1.35 mg/dL   Calcium 7.9 (*) 8.4 - 10.5 mg/dL   GFR calc non Af Amer 86 (*) >90 mL/min   GFR calc Af Amer >90  >90 mL/min  TROPONIN I     Status: Abnormal   Collection Time   01/24/13 11:30 PM      Component Value Range   Troponin I 1.81 (*) <0.30 ng/mL  CBC     Status: Abnormal   Collection Time   01/24/13 11:30 PM      Component Value Range   WBC 18.7 (*) 4.0 - 10.5 K/uL   RBC 5.01  4.22 - 5.81 MIL/uL  Hemoglobin 15.3  13.0 - 17.0 g/dL   HCT 45.4  09.8 - 11.9 %   MCV 91.6  78.0 - 100.0 fL   MCH 30.5  26.0 - 34.0 pg   MCHC 33.3  30.0 - 36.0 g/dL   RDW 14.7 (*) 82.9 - 56.2 %   Platelets 198  150 - 400 K/uL  BASIC METABOLIC PANEL     Status: Abnormal   Collection Time   01/24/13 11:30 PM      Component Value Range   Sodium 139  135 - 145 mEq/L   Potassium 3.8  3.5 - 5.1 mEq/L   Chloride 109  96 - 112 mEq/L   CO2 22  19 - 32 mEq/L   Glucose, Bld 127 (*) 70 - 99 mg/dL   BUN 12  6 - 23 mg/dL   Creatinine, Ser 1.30  0.50 - 1.35 mg/dL   Calcium 7.5 (*) 8.4 - 10.5 mg/dL   GFR calc non Af Amer 68 (*) >90 mL/min   GFR calc Af Amer 79 (*) >90 mL/min  TROPONIN I     Status: Abnormal   Collection Time   01/25/13  4:20 AM      Component Value Range   Troponin I 1.37 (*) <0.30 ng/mL  MAGNESIUM     Status: Abnormal   Collection Time   01/25/13  4:20 AM      Component Value Range   Magnesium 1.3 (*) 1.5 - 2.5 mg/dL  PHOSPHORUS     Status: Normal   Collection Time   01/25/13  4:20 AM      Component Value Range   Phosphorus 2.5  2.3 - 4.6 mg/dL  PROTIME-INR     Status: Abnormal   Collection Time   01/25/13  4:20 AM       Component Value Range   Prothrombin Time 18.8 (*) 11.6 - 15.2 seconds   INR 1.63 (*) 0.00 - 1.49  CBC     Status: Abnormal   Collection Time   01/25/13  4:20 AM      Component Value Range   WBC 16.8 (*) 4.0 - 10.5 K/uL   RBC 5.09  4.22 - 5.81 MIL/uL   Hemoglobin 14.9  13.0 - 17.0 g/dL   HCT 86.5  78.4 - 69.6 %   MCV 90.8  78.0 - 100.0 fL   MCH 29.3  26.0 - 34.0 pg   MCHC 32.3  30.0 - 36.0 g/dL   RDW 29.5 (*) 28.4 - 13.2 %   Platelets 194  150 - 400 K/uL  BASIC METABOLIC PANEL     Status: Abnormal   Collection Time   01/25/13  5:00 AM      Component Value Range   Sodium 143  135 - 145 mEq/L   Potassium 3.9  3.5 - 5.1 mEq/L   Chloride 112  96 - 112 mEq/L   CO2 20  19 - 32 mEq/L   Glucose, Bld 107 (*) 70 - 99 mg/dL   BUN 12  6 - 23 mg/dL   Creatinine, Ser 4.40  0.50 - 1.35 mg/dL   Calcium 7.8 (*) 8.4 - 10.5 mg/dL   GFR calc non Af Amer 69 (*) >90 mL/min   GFR calc Af Amer 80 (*) >90 mL/min    Intake/Output Summary (Last 24 hours) at 01/25/13 0647 Last data filed at 01/25/13 1027  Gross per 24 hour  Intake 5305.9 ml  Output    735 ml  Net 4570.9 ml  ASSESSMENT AND PLAN:  Cardiac arrest:  Etiology unclear.  Echo with reduced EF.  No evidence of PE or dissection on CT.  Troponin trending down.  Suspect arrhythmia related to acute coronary event.  He will need cath eventually.  ASA and heparin as below.    History of clotting disorder.  He has been on warfarin in the past.    With INR now below two and no evidence of GI bleeding I will start heparin.     Fayrene Fearing [facility] 01/25/2013 6:47 AM

## 2013-01-26 ENCOUNTER — Inpatient Hospital Stay (HOSPITAL_COMMUNITY): Payer: BC Managed Care – PPO

## 2013-01-26 LAB — GLUCOSE, CAPILLARY
Glucose-Capillary: 106 mg/dL — ABNORMAL HIGH (ref 70–99)
Glucose-Capillary: 110 mg/dL — ABNORMAL HIGH (ref 70–99)
Glucose-Capillary: 71 mg/dL (ref 70–99)
Glucose-Capillary: 71 mg/dL (ref 70–99)
Glucose-Capillary: 84 mg/dL (ref 70–99)
Glucose-Capillary: 87 mg/dL (ref 70–99)
Glucose-Capillary: 87 mg/dL (ref 70–99)

## 2013-01-26 LAB — CBC
HCT: 41.3 % (ref 39.0–52.0)
Hemoglobin: 13.7 g/dL (ref 13.0–17.0)
MCH: 29.9 pg (ref 26.0–34.0)
MCHC: 33.2 g/dL (ref 30.0–36.0)
MCV: 90.2 fL (ref 78.0–100.0)
Platelets: 189 10*3/uL (ref 150–400)
RBC: 4.58 MIL/uL (ref 4.22–5.81)
RDW: 15.9 % — ABNORMAL HIGH (ref 11.5–15.5)
WBC: 15 10*3/uL — ABNORMAL HIGH (ref 4.0–10.5)

## 2013-01-26 LAB — BASIC METABOLIC PANEL
BUN: 11 mg/dL (ref 6–23)
CO2: 23 mEq/L (ref 19–32)
Calcium: 8.1 mg/dL — ABNORMAL LOW (ref 8.4–10.5)
Chloride: 110 mEq/L (ref 96–112)
Creatinine, Ser: 0.99 mg/dL (ref 0.50–1.35)
GFR calc Af Amer: >90 mL/min (ref >90)
GFR calc non Af Amer: 85 mL/min — ABNORMAL LOW (ref >90)
Glucose, Bld: 99 mg/dL (ref 70–99)
Potassium: 3.6 mEq/L (ref 3.5–5.1)
Sodium: 141 mEq/L (ref 135–145)

## 2013-01-26 LAB — CULTURE, RESPIRATORY

## 2013-01-26 LAB — MAGNESIUM: Magnesium: 1.9 mg/dL (ref 1.5–2.5)

## 2013-01-26 LAB — CULTURE, RESPIRATORY W GRAM STAIN

## 2013-01-26 LAB — PROTIME-INR
INR: 1.41 (ref 0.00–1.49)
Prothrombin Time: 16.9 seconds — ABNORMAL HIGH (ref 11.6–15.2)

## 2013-01-26 LAB — HEPARIN LEVEL (UNFRACTIONATED): Heparin Unfractionated: 0.47 IU/mL (ref 0.30–0.70)

## 2013-01-26 MED ORDER — LIDOCAINE 5 % EX PTCH
1.0000 | MEDICATED_PATCH | Freq: Every day | CUTANEOUS | Status: DC
  Administered 2013-01-26 – 2013-02-01 (×7): 1 via TRANSDERMAL
  Filled 2013-01-26 (×7): qty 1

## 2013-01-26 MED ORDER — MAGNESIUM SULFATE IN D5W 10-5 MG/ML-% IV SOLN
1.0000 g | Freq: Once | INTRAVENOUS | Status: AC
  Administered 2013-01-26: 1 g via INTRAVENOUS
  Filled 2013-01-26: qty 100

## 2013-01-26 MED ORDER — FUROSEMIDE 10 MG/ML IJ SOLN
20.0000 mg | Freq: Once | INTRAMUSCULAR | Status: AC
  Administered 2013-01-26: 20 mg via INTRAVENOUS

## 2013-01-26 MED ORDER — FUROSEMIDE 10 MG/ML IJ SOLN
INTRAMUSCULAR | Status: AC
  Administered 2013-01-26: 20 mg via INTRAVENOUS
  Filled 2013-01-26: qty 4

## 2013-01-26 MED ORDER — METOPROLOL TARTRATE 1 MG/ML IV SOLN
5.0000 mg | Freq: Three times a day (TID) | INTRAVENOUS | Status: DC
  Administered 2013-01-26 – 2013-01-28 (×6): 5 mg via INTRAVENOUS
  Filled 2013-01-26 (×9): qty 5

## 2013-01-26 MED ORDER — POTASSIUM CHLORIDE 20 MEQ/15ML (10%) PO LIQD
40.0000 meq | Freq: Once | ORAL | Status: AC
  Administered 2013-01-26: 40 meq
  Filled 2013-01-26: qty 30

## 2013-01-26 MED ORDER — ASPIRIN 300 MG RE SUPP
300.0000 mg | Freq: Every day | RECTAL | Status: DC
  Administered 2013-01-26: 300 mg via RECTAL
  Filled 2013-01-26 (×2): qty 1

## 2013-01-26 MED ORDER — POTASSIUM CHLORIDE 20 MEQ/15ML (10%) PO LIQD
ORAL | Status: AC
  Administered 2013-01-26: 40 meq
  Filled 2013-01-26: qty 30
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
## 2013-01-26 NOTE — Plan of Care (Signed)
Problem: Phase II Progression Outcomes Goal: Extubated and maintains O2 sats > 92% Outcome: Completed/Met Date Met:  01/26/13 Pt extubated to nonrebreather, now on Venturi mask 50%

## 2013-01-26 NOTE — Progress Notes (Signed)
NUTRITION FOLLOW UP  Intervention:   1.  RD will continue to monitor diet advance and PO intake  Nutrition Dx:   Inadequate oral intake related to inability to eat as evidenced by NPO status. Ongoing  Goal:   EN goals- no longer applicable New Goal: Diet advance to meet >/=90% estimated nutrition needs  Monitor:   Resp. Status, diet advance, weight trends, I/O's  Assessment:   Pt rewarmed, now extubated this morning. RD will follow for swallowing and diet advance.  Feeding tube out with extubation, TF was not initiated while pt intubated. Does have Pro-stat ordered orally at this time?   Height: Ht Readings from Last 1 Encounters:  01/23/13 5\' 10"  (1.778 m)    Weight Status:   Wt Readings from Last 1 Encounters:  01/26/13 214 lb 1.1 oz (97.1 kg)  Up from 196 lbs at admission  Re-estimated needs:  Kcal: 2100-2300 Protein: 100-115 gm  Fluid: >/=2 L   Skin: several skin tears  Diet Order: NPO   Intake/Output Summary (Last 24 hours) at 01/26/13 0916 Last data filed at 01/26/13 0800  Gross per 24 hour  Intake 2396.99 ml  Output   3610 ml  Net -1213.01 ml    Last BM: 1/28   Labs:   Lab 01/26/13 0430 01/25/13 0500 01/25/13 0420 01/24/13 2330 01/24/13 0548  NA 141 143 -- 139 --  K 3.6 3.9 -- 3.8 --  CL 110 112 -- 109 --  CO2 23 20 -- 22 --  BUN 11 12 -- 12 --  CREATININE 0.99 1.10 -- 1.11 --  CALCIUM 8.1* 7.8* -- 7.5* --  MG 1.9 -- 1.3* -- 1.1*  PHOS -- -- 2.5 -- 2.3  GLUCOSE 99 107* -- 127* --    CBG (last 3)   Basename 01/26/13 0708 01/26/13 0423 01/26/13 0030  GLUCAP 71 87 87    Scheduled Meds:   . ampicillin-sulbactam (UNASYN) 1.5 g IVPB  1.5 g Intravenous Q6H  . aspirin  81 mg Oral Daily  . dextrose  25 mL Intravenous Once  . feeding supplement  30 mL Oral TID WC  . furosemide  20 mg Intravenous Once  . levetiracetam  500 mg Intravenous BID  . metoprolol tartrate  12.5 mg Oral BID  . pantoprazole (PROTONIX) IV  40 mg Intravenous Q12H     Continuous Infusions:   . sodium chloride 10 mL/hr at 01/26/13 0443  . dextrose 50 mL/hr at 01/25/13 2138  . heparin 1,750 Units/hr (01/26/13 0443)  . phenylephrine (NEO-SYNEPHRINE) Adult infusion 15 mcg/min (01/26/13 0300)    Clarene Duke RD, LDN Pager [phone] After Hours pager [phone]

## 2013-01-26 NOTE — Procedures (Signed)
Extubation Procedure Note  Patient Details:   Name: Travis Cruz DOB: [birth year] MRN: [id number]   Airway Documentation:  Airway 7.5 mm (Active)  Secured at (cm) 25 cm 01/26/2013  7:59 AM  Measured From Lips 01/26/2013  7:59 AM  Secured Location Right 01/26/2013  7:59 AM  Secured By [employer] 01/26/2013  7:59 AM  Tube Holder Repositioned Yes 01/26/2013  7:59 AM  Cuff Pressure (cm H2O) 22 cm H2O 01/26/2013  7:59 AM  Site Condition Dry 01/26/2013  7:59 AM    Evaluation  O2 sats: 92 on 50% VM Complications: No apparent complications Patient did tolerate procedure well. Bilateral Breath Sounds: Diminished;Rhonchi Suctioning: Oral;Airway Yes  Pt is awake and can follow commands. Pt has good cough/gag. Positive cuff leak. Per MD ok to extubate to 50% VM. Pt tol well. No stridor. BBS crs dim, rr 22, sats [address]  Kandis Nab 01/26/2013, 8:59 AM

## 2013-01-26 NOTE — Progress Notes (Signed)
SUBJECTIVE:  Intubated. Wide awake.  Denies any air hunger or chest pain.     PHYSICAL EXAM Filed Vitals:   01/26/13 0423 01/26/13 0500 01/26/13 0600 01/26/13 0800  BP:    105/63  Pulse: 66 75 72 87  Temp: 98.2 F (36.8 C)   98.6 F (37 C)  TempSrc:    Core (Comment)  Resp: 28 29 30 22   Height:      Weight:      SpO2: 99% 95% 98% 95%   General:  Intubated and sedated.   Lungs:  Normal Heart:  Normal S1 and S2 Abdomen:  Decreased bowel sounds Extremities:  Diffuse edema  LABS: Lab Results  Component Value Date   CKTOTAL 779* 01/24/2013   CKMB 72.1* 01/24/2013   TROPONINI 1.37* 01/25/2013   Results for orders placed during the hospital encounter of 01/22/13 (from the past 24 hour(s))  POCT I-STAT 3, BLOOD GAS (G3P V)     Status: Abnormal   Collection Time   01/25/13 10:05 AM      Component Value Range   pH, Ven 7.290  7.250 - 7.300   pCO2, Ven 46.8  45.0 - 50.0 mmHg   pO2, Ven 61.0 (*) 30.0 - 45.0 mmHg   Bicarbonate 22.3  20.0 - 24.0 mEq/L   TCO2 24  0 - 100 mmol/L   O2 Saturation 86.0     Acid-base deficit 4.0 (*) 0.0 - 2.0 mmol/L   Patient temperature 37.8 C     Collection site ARTERIAL LINE     Drawn by RT     Sample type MIXED VENOUS SAMPLE    GLUCOSE, CAPILLARY     Status: Normal   Collection Time   01/25/13 11:24 AM      Component Value Range   Glucose-Capillary 83  70 - 99 mg/dL  GLUCOSE, CAPILLARY     Status: Abnormal   Collection Time   01/25/13  3:29 PM      Component Value Range   Glucose-Capillary 62 (*) 70 - 99 mg/dL  GLUCOSE, CAPILLARY     Status: Abnormal   Collection Time   01/25/13  3:51 PM      Component Value Range   Glucose-Capillary 67 (*) 70 - 99 mg/dL  GLUCOSE, CAPILLARY     Status: Abnormal   Collection Time   01/25/13  4:12 PM      Component Value Range   Glucose-Capillary 114 (*) 70 - 99 mg/dL  HEPARIN LEVEL (UNFRACTIONATED)     Status: Abnormal   Collection Time   01/25/13  7:30 PM      Component Value Range   Heparin  Unfractionated 0.13 (*) 0.30 - 0.70 IU/mL  GLUCOSE, CAPILLARY     Status: Abnormal   Collection Time   01/25/13  7:56 PM      Component Value Range   Glucose-Capillary 59 (*) 70 - 99 mg/dL  GLUCOSE, CAPILLARY     Status: Normal   Collection Time   01/25/13  8:52 PM      Component Value Range   Glucose-Capillary 87  70 - 99 mg/dL  GLUCOSE, CAPILLARY     Status: Normal   Collection Time   01/26/13 12:30 AM      Component Value Range   Glucose-Capillary 87  70 - 99 mg/dL  GLUCOSE, CAPILLARY     Status: Normal   Collection Time   01/26/13  4:23 AM      Component Value Range   Glucose-Capillary  87  70 - 99 mg/dL  MAGNESIUM     Status: Normal   Collection Time   01/26/13  4:30 AM      Component Value Range   Magnesium 1.9  1.5 - 2.5 mg/dL  PROTIME-INR     Status: Abnormal   Collection Time   01/26/13  4:30 AM      Component Value Range   Prothrombin Time 16.9 (*) 11.6 - 15.2 seconds   INR 1.41  0.00 - 1.49  HEPARIN LEVEL (UNFRACTIONATED)     Status: Normal   Collection Time   01/26/13  4:30 AM      Component Value Range   Heparin Unfractionated 0.47  0.30 - 0.70 IU/mL  CBC     Status: Abnormal   Collection Time   01/26/13  4:30 AM      Component Value Range   WBC 15.0 (*) 4.0 - 10.5 K/uL   RBC 4.58  4.22 - 5.81 MIL/uL   Hemoglobin 13.7  13.0 - 17.0 g/dL   HCT 21.3  08.6 - 57.8 %   MCV 90.2  78.0 - 100.0 fL   MCH 29.9  26.0 - 34.0 pg   MCHC 33.2  30.0 - 36.0 g/dL   RDW 46.9 (*) 62.9 - 52.8 %   Platelets 189  150 - 400 K/uL  BASIC METABOLIC PANEL     Status: Abnormal   Collection Time   01/26/13  4:30 AM      Component Value Range   Sodium 141  135 - 145 mEq/L   Potassium 3.6  3.5 - 5.1 mEq/L   Chloride 110  96 - 112 mEq/L   CO2 23  19 - 32 mEq/L   Glucose, Bld 99  70 - 99 mg/dL   BUN 11  6 - 23 mg/dL   Creatinine, Ser 4.13  0.50 - 1.35 mg/dL   Calcium 8.1 (*) 8.4 - 10.5 mg/dL   GFR calc non Af Amer 85 (*) >90 mL/min   GFR calc Af Amer >90  >90 mL/min  GLUCOSE,  CAPILLARY     Status: Normal   Collection Time   01/26/13  7:08 AM      Component Value Range   Glucose-Capillary 71  70 - 99 mg/dL    Intake/Output Summary (Last 24 hours) at 01/26/13 0814 Last data filed at 01/26/13 0800  Gross per 24 hour  Intake 2517.59 ml  Output   3760 ml  Net -1242.41 ml   EKG: NSR, rate 76, old anterior MI.  Possible old inferior MI.  Prolonged QT.  No acute ST T wave changes.   ASSESSMENT AND PLAN:  Cardiac arrest:   Suspect acute ischemic event as etiology.  NQWMI.   Echo with reduced EF.  No evidence of PE or dissection on CT.  Troponin trending down.  He is to be extubated today.  Tolerating heprain and ECASA.  Cath eventually.   History of clotting disorder.  He has been on warfarin in the past.   Holding for now.   Fayrene Fearing [facility] 01/26/2013 8:14 AM

## 2013-01-26 NOTE — Progress Notes (Signed)
ANTICOAGULATION CONSULT NOTE - Follow-Up Consult  Pharmacy Consult for heparin Indication: pulmonary embolus, DVT and blood clotting disorder  Patient Measurements: Height: 5\' 10"  (177.8 cm) Weight: 214 lb 1.1 oz (97.1 kg) IBW/kg (Calculated) : 73   Vital Signs: Temp: 98.4 F (36.9 C) (01/29 0900) Temp src: Core (Comment) (01/29 0800) BP: 121/67 mmHg (01/29 0900) Pulse Rate: 97  (01/29 0900)  Labs:  Basename 01/26/13 0430 01/25/13 1930 01/25/13 0500 01/25/13 0420 01/24/13 2330 01/24/13 1638 01/24/13 0830 01/24/13 0004 01/23/13 1604  HGB 13.7 -- -- 14.9 -- -- -- -- --  HCT 41.3 -- -- 46.2 45.9 -- -- -- --  PLT 189 -- -- 194 198 -- -- -- --  APTT -- -- -- -- -- -- -- -- --  LABPROT 16.9* -- -- 18.8* -- -- 50.0* -- --  INR 1.41 -- -- 1.63* -- -- 6.10* -- --  HEPARINUNFRC 0.47 0.13* -- -- -- -- -- -- --  CREATININE 0.99 -- 1.10 -- 1.11 -- -- -- --  CKTOTAL -- -- -- -- -- -- -- 779* 943*  CKMB -- -- -- -- -- -- -- 72.1* 90.3*  TROPONINI -- -- -- 1.37* 1.81* 1.54* -- -- --    Estimated Creatinine Clearance: 88.1 ml/min (by C-G formula based on Cr of 0.99).   Assessment: 67 yo male admitted post-cardiac arrest and now s/p therapeutic hypothermia. Patient with h/o of PE, DVT on Coumadin prior to admission. INR on admit of 3.93. INR on 1/27 was 6.1, and patient then received 5mg  IV phytonadione x 1. INR this AM was 1.4 - pharmacy to manage IV heparin. Per RN this morning no more clots have been seen, some bright red blood suctioned but it was believed to be from irration and not active bleeding. CBC is down from yesterday. Heparin is at goal.  Goal of Therapy:  Heparin level 0.3-0.5 units/ml Monitor platelets by anticoagulation protocol: Yes   Plan:  Continue IV heparin to 1750 units/hr. Check a heparin level and CBC with AM labs.  Severiano Gilbert 01/26/2013,10:15 AM

## 2013-01-27 DIAGNOSIS — J449 Chronic obstructive pulmonary disease, unspecified: Secondary | ICD-10-CM

## 2013-01-27 DIAGNOSIS — I252 Old myocardial infarction: Secondary | ICD-10-CM | POA: Diagnosis present

## 2013-01-27 DIAGNOSIS — I214 Non-ST elevation (NSTEMI) myocardial infarction: Secondary | ICD-10-CM | POA: Diagnosis present

## 2013-01-27 DIAGNOSIS — J9600 Acute respiratory failure, unspecified whether with hypoxia or hypercapnia: Secondary | ICD-10-CM

## 2013-01-27 LAB — URINALYSIS, MICROSCOPIC ONLY
Bilirubin Urine: NEGATIVE
Glucose, UA: NEGATIVE mg/dL
Ketones, ur: NEGATIVE mg/dL
Leukocytes, UA: NEGATIVE
Nitrite: NEGATIVE
Protein, ur: NEGATIVE mg/dL
Specific Gravity, Urine: 1.01 (ref 1.005–1.030)
Urobilinogen, UA: 1 mg/dL (ref 0.0–1.0)
pH: 7 (ref 5.0–8.0)

## 2013-01-27 LAB — CBC
HCT: 45.9 % (ref 39.0–52.0)
Hemoglobin: 14.5 g/dL (ref 13.0–17.0)
MCH: 29.1 pg (ref 26.0–34.0)
MCHC: 31.6 g/dL (ref 30.0–36.0)
MCV: 92 fL (ref 78.0–100.0)
Platelets: 199 10*3/uL (ref 150–400)
RBC: 4.99 MIL/uL (ref 4.22–5.81)
RDW: 15.9 % — ABNORMAL HIGH (ref 11.5–15.5)
WBC: 16.6 10*3/uL — ABNORMAL HIGH (ref 4.0–10.5)

## 2013-01-27 LAB — COMPREHENSIVE METABOLIC PANEL
ALT: 18 U/L (ref 0–53)
AST: 23 U/L (ref 0–37)
Albumin: 2.5 g/dL — ABNORMAL LOW (ref 3.5–5.2)
Alkaline Phosphatase: 90 U/L (ref 39–117)
BUN: 9 mg/dL (ref 6–23)
CO2: 28 mEq/L (ref 19–32)
Calcium: 8.5 mg/dL (ref 8.4–10.5)
Chloride: 106 mEq/L (ref 96–112)
Creatinine, Ser: 0.89 mg/dL (ref 0.50–1.35)
GFR calc Af Amer: >90 mL/min (ref >90)
GFR calc non Af Amer: 88 mL/min — ABNORMAL LOW (ref >90)
Glucose, Bld: 108 mg/dL — ABNORMAL HIGH (ref 70–99)
Potassium: 3.8 mEq/L (ref 3.5–5.1)
Sodium: 142 mEq/L (ref 135–145)
Total Bilirubin: 1 mg/dL (ref 0.3–1.2)
Total Protein: 5.9 g/dL — ABNORMAL LOW (ref 6.0–8.3)

## 2013-01-27 LAB — HEPARIN LEVEL (UNFRACTIONATED): Heparin Unfractionated: 0.42 IU/mL (ref 0.30–0.70)

## 2013-01-27 LAB — GLUCOSE, CAPILLARY
Glucose-Capillary: 106 mg/dL — ABNORMAL HIGH (ref 70–99)
Glucose-Capillary: 83 mg/dL (ref 70–99)
Glucose-Capillary: 138 mg/dL — ABNORMAL HIGH (ref 70–99)
Glucose-Capillary: 112 mg/dL — ABNORMAL HIGH (ref 70–99)
Glucose-Capillary: 114 mg/dL — ABNORMAL HIGH (ref 70–99)

## 2013-01-27 LAB — MAGNESIUM: Magnesium: 1.7 mg/dL (ref 1.5–2.5)

## 2013-01-27 LAB — PROTIME-INR
INR: 1.14 (ref 0.00–1.49)
Prothrombin Time: 14.4 seconds (ref 11.6–15.2)

## 2013-01-27 MED ORDER — CHLORHEXIDINE GLUCONATE 0.12 % MT SOLN
15.0000 mL | Freq: Two times a day (BID) | OROMUCOSAL | Status: DC
  Administered 2013-01-27: 15 mL via OROMUCOSAL
  Filled 2013-01-27 (×2): qty 15

## 2013-01-27 MED ORDER — ASPIRIN 325 MG PO TABS
325.0000 mg | ORAL_TABLET | Freq: Every day | ORAL | Status: DC
  Filled 2013-01-27: qty 1

## 2013-01-27 MED ORDER — PANTOPRAZOLE SODIUM 40 MG IV SOLR
40.0000 mg | INTRAVENOUS | Status: DC
  Administered 2013-01-27: 40 mg via INTRAVENOUS
  Filled 2013-01-27 (×2): qty 40

## 2013-01-27 MED ORDER — SODIUM CHLORIDE 0.9 % IV SOLN: 250.0000 mL | INTRAVENOUS | Status: DC | PRN

## 2013-01-27 MED ORDER — ASPIRIN 325 MG PO TABS
325.0000 mg | ORAL_TABLET | Freq: Every day | ORAL | Status: DC
  Administered 2013-01-27: 325 mg via ORAL
  Filled 2013-01-27: qty 1

## 2013-01-27 MED ORDER — SODIUM CHLORIDE 0.9 % IV SOLN
1.0000 mL/kg/h | INTRAVENOUS | Status: DC
  Administered 2013-01-28: 1 mL/kg/h via INTRAVENOUS

## 2013-01-27 MED ORDER — BIOTENE DRY MOUTH MT LIQD
15.0000 mL | Freq: Two times a day (BID) | OROMUCOSAL | Status: DC
  Administered 2013-01-27 – 2013-02-04 (×15): 15 mL via OROMUCOSAL

## 2013-01-27 MED ORDER — BIOTENE DRY MOUTH MT LIQD
15.0000 mL | Freq: Two times a day (BID) | OROMUCOSAL | Status: DC
  Administered 2013-01-27 (×2): 15 mL via OROMUCOSAL

## 2013-01-27 MED ORDER — ASPIRIN 81 MG PO CHEW
324.0000 mg | CHEWABLE_TABLET | ORAL | Status: AC
  Administered 2013-01-28: 324 mg via ORAL
  Filled 2013-01-27: qty 4
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
## 2013-01-27 NOTE — Progress Notes (Signed)
01/27/2013 5:25 PM  Pt sat up in chair for 3 hours and tol well.El Pile, Linnell Fulling

## 2013-01-27 NOTE — Progress Notes (Signed)
ANTICOAGULATION CONSULT NOTE - Follow-Up Consult  Pharmacy Consult for heparin Indication: pulmonary embolus, DVT and blood clotting disorder  Patient Measurements: Height: 5\' 10"  (177.8 cm) Weight: 203 lb 0.7 oz (92.1 kg) IBW/kg (Calculated) : 73   Vital Signs: Temp: 97.7 F (36.5 C) (01/30 0400) Temp src: Core (Comment) (01/30 0000) BP: 103/61 mmHg (01/30 0700) Pulse Rate: 74  (01/30 0700)  Labs:  Basename 01/27/13 0545 01/26/13 0430 01/25/13 1930 01/25/13 0500 01/25/13 0420 01/24/13 2330 01/24/13 1638  HGB 14.5 13.7 -- -- -- -- --  HCT 45.9 41.3 -- -- 46.2 -- --  PLT 199 189 -- -- 194 -- --  APTT -- -- -- -- -- -- --  LABPROT 14.4 16.9* -- -- 18.8* -- --  INR 1.14 1.41 -- -- 1.63* -- --  HEPARINUNFRC 0.42 0.47 0.13* -- -- -- --  CREATININE 0.89 0.99 -- 1.10 -- -- --  CKTOTAL -- -- -- -- -- -- --  CKMB -- -- -- -- -- -- --  TROPONINI -- -- -- -- 1.37* 1.81* 1.54*    Estimated Creatinine Clearance: 95.6 ml/min (by C-G formula based on Cr of 0.89).   Assessment: 67 yo male admitted post-cardiac arrest and now s/p therapeutic hypothermia. Patient with h/o of PE, DVT on Coumadin prior to admission. INR on admit of 3.93. INR on 1/27 was 6.1, and patient then received 5mg  IV phytonadione x 1 and pharmacy to manage IV heparin.  Heparin level today is 0.42 and at goal. Patient noted for cath on 01/28/13.   Goal of Therapy:  Heparin level 0.3-0.5 units/ml Monitor platelets by anticoagulation protocol: Yes   Plan:  -Continue IV heparin to 1750 units/hr. -Check a heparin level and CBC with AM labs.  Harland German, Pharm D 01/27/2013 8:27 AM
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
## 2013-01-27 NOTE — Progress Notes (Signed)
SUBJECTIVE:  Now extubated.     PHYSICAL EXAM Filed Vitals:   01/27/13 0400 01/27/13 0500 01/27/13 0600 01/27/13 0700  BP: 88/53 112/67 118/65 103/61  Pulse: 86 82 88 74  Temp: 97.7 F (36.5 C)     TempSrc:      Resp: 13 18 21 19   Height:      Weight:   203 lb 0.7 oz (92.1 kg)   SpO2: 95% 98% 98% 97%   General:  Extubated.  No distress HEENT:  PERRL Lungs:   No crackles, decreased breath sounds Heart:  Normal S1 and S2 Abdomen:  Decreased bowel sounds Extremities:  Diffuse edema Neuro:  Intact  LABS:  Results for orders placed during the hospital encounter of 01/22/13 (from the past 24 hour(s))  GLUCOSE, CAPILLARY     Status: Abnormal   Collection Time   01/26/13 11:11 AM      Component Value Range   Glucose-Capillary 110 (*) 70 - 99 mg/dL  GLUCOSE, CAPILLARY     Status: Normal   Collection Time   01/26/13  4:14 PM      Component Value Range   Glucose-Capillary 84  70 - 99 mg/dL  GLUCOSE, CAPILLARY     Status: Normal   Collection Time   01/26/13  7:15 PM      Component Value Range   Glucose-Capillary 71  70 - 99 mg/dL  GLUCOSE, CAPILLARY     Status: Abnormal   Collection Time   01/27/13 12:03 AM      Component Value Range   Glucose-Capillary 106 (*) 70 - 99 mg/dL  GLUCOSE, CAPILLARY     Status: Abnormal   Collection Time   01/27/13  3:46 AM      Component Value Range   Glucose-Capillary 106 (*) 70 - 99 mg/dL  HEPARIN LEVEL (UNFRACTIONATED)     Status: Normal   Collection Time   01/27/13  5:45 AM      Component Value Range   Heparin Unfractionated 0.42  0.30 - 0.70 IU/mL  CBC     Status: Abnormal   Collection Time   01/27/13  5:45 AM      Component Value Range   WBC 16.6 (*) 4.0 - 10.5 K/uL   RBC 4.99  4.22 - 5.81 MIL/uL   Hemoglobin 14.5  13.0 - 17.0 g/dL   HCT 16.1  09.6 - 04.5 %   MCV 92.0  78.0 - 100.0 fL   MCH 29.1  26.0 - 34.0 pg   MCHC 31.6  30.0 - 36.0 g/dL   RDW 40.9 (*) 81.1 - 91.4 %   Platelets 199  150 - 400 K/uL  MAGNESIUM     Status:  Normal   Collection Time   01/27/13  5:45 AM      Component Value Range   Magnesium 1.7  1.5 - 2.5 mg/dL  COMPREHENSIVE METABOLIC PANEL     Status: Abnormal   Collection Time   01/27/13  5:45 AM      Component Value Range   Sodium 142  135 - 145 mEq/L   Potassium 3.8  3.5 - 5.1 mEq/L   Chloride 106  96 - 112 mEq/L   CO2 28  19 - 32 mEq/L   Glucose, Bld 108 (*) 70 - 99 mg/dL   BUN 9  6 - 23 mg/dL   Creatinine, Ser 7.82  0.50 - 1.35 mg/dL   Calcium 8.5  8.4 - 95.6 mg/dL   Total Protein 5.9 (*)  6.0 - 8.3 g/dL   Albumin 2.5 (*) 3.5 - 5.2 g/dL   AST 23  0 - 37 U/L   ALT 18  0 - 53 U/L   Alkaline Phosphatase 90  39 - 117 U/L   Total Bilirubin 1.0  0.3 - 1.2 mg/dL   GFR calc non Af Amer 88 (*) >90 mL/min   GFR calc Af Amer >90  >90 mL/min  PROTIME-INR     Status: Normal   Collection Time   01/27/13  5:45 AM      Component Value Range   Prothrombin Time 14.4  11.6 - 15.2 seconds   INR 1.14  0.00 - 1.49    Intake/Output Summary (Last 24 hours) at 01/27/13 0738 Last data filed at 01/27/13 0700  Gross per 24 hour  Intake 2407.5 ml  Output   6275 ml  Net -3867.5 ml   EKG: NSR, rate 74, old anterior MI.  Possible old inferior MI.  Old anteroseptal MI.   No acute ST T wave changes.  01/27/2013  ASSESSMENT AND PLAN:  Cardiac arrest:   Suspect acute ischemic event as etiology.  NQWMI.   Echo with reduced EF.  No evidence of PE or dissection on CT.  Tolerating heprain and ECASA.  Cath tomorrow.  Discussed with patient.  The patient understands that risks included but are not limited to stroke (1 in 1000), death (1 in 1000), kidney failure [usually temporary] (1 in 500), bleeding (1 in 200), allergic reaction [possibly serious] (1 in 200).  The patient understands and agrees to proceed.   History of clotting disorder.  He has been on warfarin in the past.   Holding for now.   Rollene Rotunda 01/27/2013 7:38 AM

## 2013-01-27 NOTE — Progress Notes (Signed)
Rehab Admissions Coordinator Note:  Patient was screened by Trish Mage for appropriateness for an Inpatient Acute Rehab Consult.  At this time, we are recommending Inpatient Rehab consult.  An inpatient rehab consult has already been ordered and is pending completion today.  Lelon Frohlich M 01/27/2013, 2:19 PM  I can be reached at [phone].

## 2013-01-28 DIAGNOSIS — R5381 Other malaise: Secondary | ICD-10-CM

## 2013-01-28 DIAGNOSIS — I214 Non-ST elevation (NSTEMI) myocardial infarction: Secondary | ICD-10-CM

## 2013-01-28 DIAGNOSIS — G929 Unspecified toxic encephalopathy: Secondary | ICD-10-CM

## 2013-01-28 DIAGNOSIS — G92 Toxic encephalopathy: Secondary | ICD-10-CM

## 2013-01-28 LAB — CBC
HCT: 42.4 % (ref 39.0–52.0)
Hemoglobin: 13.9 g/dL (ref 13.0–17.0)
MCH: 30.2 pg (ref 26.0–34.0)
MCHC: 32.8 g/dL (ref 30.0–36.0)
MCV: 92.2 fL (ref 78.0–100.0)
Platelets: 211 10*3/uL (ref 150–400)
RBC: 4.6 MIL/uL (ref 4.22–5.81)
RDW: 15.7 % — ABNORMAL HIGH (ref 11.5–15.5)
WBC: 14.6 10*3/uL — ABNORMAL HIGH (ref 4.0–10.5)

## 2013-01-28 LAB — HEPARIN LEVEL (UNFRACTIONATED): Heparin Unfractionated: 0.37 [IU]/mL (ref 0.30–0.70)

## 2013-01-28 LAB — GLUCOSE, CAPILLARY
Glucose-Capillary: 86 mg/dL (ref 70–99)
Glucose-Capillary: 94 mg/dL (ref 70–99)

## 2013-01-28 MED ORDER — FUROSEMIDE 40 MG PO TABS
40.0000 mg | ORAL_TABLET | Freq: Every day | ORAL | Status: DC
  Administered 2013-01-28: 40 mg via ORAL
  Filled 2013-01-28 (×2): qty 1

## 2013-01-28 MED ORDER — METOPROLOL TARTRATE 25 MG PO TABS
25.0000 mg | ORAL_TABLET | Freq: Two times a day (BID) | ORAL | Status: DC
  Administered 2013-01-28 (×2): 25 mg via ORAL
  Filled 2013-01-28 (×5): qty 1

## 2013-01-28 MED ORDER — PANTOPRAZOLE SODIUM 40 MG PO TBEC
40.0000 mg | DELAYED_RELEASE_TABLET | Freq: Every day | ORAL | Status: DC
  Administered 2013-01-28 – 2013-02-04 (×8): 40 mg via ORAL
  Filled 2013-01-28 (×7): qty 1

## 2013-01-28 MED ORDER — LEVETIRACETAM 500 MG PO TABS
500.0000 mg | ORAL_TABLET | Freq: Two times a day (BID) | ORAL | Status: DC
  Administered 2013-01-28 – 2013-01-29 (×3): 500 mg via ORAL
  Filled 2013-01-28 (×5): qty 1

## 2013-01-28 NOTE — Progress Notes (Signed)
   SUBJECTIVE:  Fatigued.  Denies any pain.    PHYSICAL EXAM Filed Vitals:   01/28/13 0400 01/28/13 0500 01/28/13 0600 01/28/13 0700  BP: 115/63 124/69 127/75 112/68  Pulse: 86 84 89 80  Temp: 98.2 F (36.8 C)     TempSrc: Core (Comment)     Resp: 20 20 23 25   Height:      Weight:   201 lb 11.5 oz (91.5 kg)   SpO2: 92% 92% 94% 95%   General:  Extubated.  No distress HEENT:  PERRL Lungs:   Bilateral basilar crackles, decreased breath sounds Heart:  Normal S1 and S2 Abdomen:  Decreased bowel sounds Extremities:  Diffuse edema Neuro:  Intact  LABS:  Results for orders placed during the hospital encounter of 01/22/13 (from the past 24 hour(s))  GLUCOSE, CAPILLARY     Status: Abnormal   Collection Time   01/27/13 11:12 AM      Component Value Range   Glucose-Capillary 138 (*) 70 - 99 mg/dL  GLUCOSE, CAPILLARY     Status: Abnormal   Collection Time   01/27/13  4:43 PM      Component Value Range   Glucose-Capillary 112 (*) 70 - 99 mg/dL  GLUCOSE, CAPILLARY     Status: Abnormal   Collection Time   01/27/13  9:35 PM      Component Value Range   Glucose-Capillary 114 (*) 70 - 99 mg/dL  HEPARIN LEVEL (UNFRACTIONATED)     Status: Normal   Collection Time   01/28/13  5:30 AM      Component Value Range   Heparin Unfractionated 0.37  0.30 - 0.70 IU/mL  CBC     Status: Abnormal   Collection Time   01/28/13  5:30 AM      Component Value Range   WBC 14.6 (*) 4.0 - 10.5 K/uL   RBC 4.60  4.22 - 5.81 MIL/uL   Hemoglobin 13.9  13.0 - 17.0 g/dL   HCT 16.1  09.6 - 04.5 %   MCV 92.2  78.0 - 100.0 fL   MCH 30.2  26.0 - 34.0 pg   MCHC 32.8  30.0 - 36.0 g/dL   RDW 40.9 (*) 81.1 - 91.4 %   Platelets 211  150 - 400 K/uL    Intake/Output Summary (Last 24 hours) at 01/28/13 7829 Last data filed at 01/28/13 0700  Gross per 24 hour  Intake 2512.52 ml  Output   2325 ml  Net 187.52 ml    ASSESSMENT AND PLAN:  Cardiac arrest:   Suspect acute ischemic event as etiology.  NQWMI.   Echo  with reduced EF.  No evidence of PE or dissection on CT.  Tolerating heprain and ECASA.  I will schedule him for a cath on Monday.  He is very fatigued today after sitting up.  Discussed with patient and wife.  I spoke with the cath lab.    History of clotting disorder.  He has been on warfarin in the past.   Holding for now.   Respiratory failure:  Per CCM.  Sounds like pulmonary edema.  I would suggest low dose diuresis. He is 4 liters positive since admit.     Fayrene Fearing [facility] 01/28/2013 7:28 AM

## 2013-01-28 NOTE — Progress Notes (Signed)
Clinical Social Work Department CLINICAL SOCIAL WORK PLACEMENT NOTE 01/28/2013  Patient:  Travis Cruz, Travis Cruz  Account Number:  [id number] Admit date:  01/22/2013  Clinical Social Worker:  Unk Lightning, LCSW  Date/time:  01/28/2013 10:15 AM  Clinical Social Work is seeking post-discharge placement for this patient at the following level of care:   SKILLED NURSING   (*CSW will update this form in Epic as items are completed)   01/28/2013  Patient/family provided with [facility] Department of Clinical Social Work's list of facilities offering this level of care within the geographic area requested by the patient (or if unable, by the patient's family).  01/28/2013  Patient/family informed of their freedom to choose among providers that offer the needed level of care, that participate in Medicare, Medicaid or managed care program needed by the patient, have an available bed and are willing to accept the patient.  01/28/2013  Patient/family informed of [facility]' ownership interest in [facility], as well as of the fact that they are under no obligation to receive care at this facility.  PASARR submitted to EDS on 01/28/2013 PASARR number received from EDS on 01/28/2013  FL2 transmitted to all facilities in geographic area requested by pt/family on  01/28/2013 FL2 transmitted to all facilities within larger geographic area on   Patient informed that his/her managed care company has contracts with or will negotiate with  certain facilities, including the following:     Patient/family informed of bed offers received:   Patient chooses bed at  Physician recommends and patient chooses bed at    Patient to be transferred to  on   Patient to be transferred to facility by   The following physician request were entered in Epic:   Additional Comments:

## 2013-01-28 NOTE — Progress Notes (Signed)
Placed pt. On CPAP of 8cm H2O via FFM with 4L O2 bled in. Pt. Is tolerating CPAP well at this time. All vitals are within normal limits.

## 2013-01-28 NOTE — Progress Notes (Signed)
ANTICOAGULATION CONSULT NOTE - Follow-Up Consult  Pharmacy Consult for heparin Indication: pulmonary embolus, DVT and blood clotting disorder  Patient Measurements: Height: 5\' 10"  (177.8 cm) Weight: 201 lb 11.5 oz (91.5 kg) IBW/kg (Calculated) : 73   Vital Signs: Temp: 97.9 F (36.6 C) (01/31 0800) Temp src: Core (Comment) (01/31 0700) BP: 112/65 mmHg (01/31 0800) Pulse Rate: 77  (01/31 0800)  Labs:  Basename 01/28/13 0530 01/27/13 0545 01/26/13 0430  HGB 13.9 14.5 --  HCT 42.4 45.9 41.3  PLT 211 199 189  APTT -- -- --  LABPROT -- 14.4 16.9*  INR -- 1.14 1.41  HEPARINUNFRC 0.37 0.42 0.47  CREATININE -- 0.89 0.99  CKTOTAL -- -- --  CKMB -- -- --  TROPONINI -- -- --    Estimated Creatinine Clearance: 95.4 ml/min (by C-G formula based on Cr of 0.89).   Assessment: 67 yo male admitted post-cardiac arrest and now s/p therapeutic hypothermia. Patient with h/o of PE, DVT on Coumadin prior to admission. INR on admit of 3.93. INR on 1/27 was 6.1, and patient then received 5mg  IV phytonadione x 1 and pharmacy to manage IV heparin.  Heparin level today is 0.37 and at goal. Patient noted for cath on Monday 2/3.   Goal of Therapy:  Heparin level 0.3-0.5 units/ml Monitor platelets by anticoagulation protocol: Yes   Plan:  -Continue IV heparin to 1750 units/hr. -Check a heparin level and CBC with AM labs.  Sheppard Coil, Pharm D 01/28/2013 8:46 AM

## 2013-01-28 NOTE — Progress Notes (Addendum)
Patient desaturated to 88%.  RT increased liter flow to 6lpm bleed in with 8cm h2o CPAP. Patients sats are now 92%.  RN made aware.

## 2013-01-28 NOTE — Progress Notes (Signed)
Met with patient, wife and daughter to evaluate pt and discuss inpatient rehab. Patient scheduled to have cardiac cath. Will follow-up Monday to assess functional and medical progress. Pt's wife reports [employer] is pt's primary insurance and understands that admission to CIR requires insurance approval. Will begin process for insurance authorization. For questions please call [phone].

## 2013-01-28 NOTE — Progress Notes (Signed)
Clinical Social Work Department BRIEF PSYCHOSOCIAL ASSESSMENT 01/28/2013  Patient:  Travis Cruz, Travis Cruz     Account Number:  [id number]     Admit date:  01/22/2013  Clinical Social Worker:  Dennison Bulla  Date/Time:  01/28/2013 10:15 AM  Referred by:  Physician  Date Referred:  01/28/2013 Referred for  SNF Placement   Other Referral:   Interview type:  Patient Other interview type:    PSYCHOSOCIAL DATA Living Status:  FAMILY Admitted from facility:   Level of care:   Primary support name:  [name] Primary support relationship to patient:  SPOUSE Degree of support available:   Strong    CURRENT CONCERNS Current Concerns  Post-Acute Placement   Other Concerns:    SOCIAL WORK ASSESSMENT / PLAN CSW received referral to assist with dc planning. CSW reviewed chart and met with patient and family at bedside. Patient agreeable to family involvement.    CSW introduced myself and explained role. Patient's wife, dtr and sister were all present for assessment. PTA patient was living with wife, dtr and 3 grandchildren. Patient was independent PTA but PT/OT is now recommending rehab at dc. CIR was consulted as well to evaluate patient. CSW explained if CIR unable to accept patient then [facility] and SNF were options. CSW provided patient with SNF list and explained process. Patient and family prefer CIR but report if that is unavailable then they would most likely choose [facility]. CSW agreeable to complete search of [facility] and to follow up after CIR has made determination.    CSW completed pasarr, FL2 and faxed out. CSW will continue to follow.   Assessment/plan status:  Psychosocial Support/Ongoing Assessment of Needs Other assessment/ plan:   Information/referral to community resources:   CIR vs SNF vs [facility] options  SNF list    PATIENT'S/FAMILY'S RESPONSE TO PLAN OF CARE: Patient alert and oriented and engaged throughout assessment. Patient and family agree that CIR is first  option and SNF as alternative option. Patient and family thanked CSW for visit.

## 2013-01-28 NOTE — Progress Notes (Signed)
Patient placed on CPAP of 8 cmH2O with 4L oxygen bled in.  Patient tolerating well at this time.  RT will continue to monitor.

## 2013-01-28 NOTE — Progress Notes (Addendum)
CRITICAL CARE RESIDENT NOTE Interim Progress Note  The patient will be transferred to the Stepdown unit today (2900), primary service will be transferred to [facility]. I spoke with Dr.Hochrein, who agrees to accept transfer of service, to be resumed tomorrow. PCCM will not be continuing to follow in consult, unless reconsulted.   Signed: Johnette Abraham, Roma Schanz, Internal Medicine Resident Pager: [phone] (7AM-5PM) 01/28/2013, 9:30 AM

## 2013-01-29 ENCOUNTER — Inpatient Hospital Stay (HOSPITAL_COMMUNITY): Payer: BC Managed Care – PPO

## 2013-01-29 LAB — BASIC METABOLIC PANEL
BUN: 7 mg/dL (ref 6–23)
BUN: 7 mg/dL (ref 6–23)
CO2: 32 mEq/L (ref 19–32)
CO2: 29 mEq/L (ref 19–32)
Calcium: 8.8 mg/dL (ref 8.4–10.5)
Calcium: 8.8 mg/dL (ref 8.4–10.5)
Chloride: 101 mEq/L (ref 96–112)
Chloride: 105 mEq/L (ref 96–112)
Creatinine, Ser: 0.97 mg/dL (ref 0.50–1.35)
Creatinine, Ser: 0.81 mg/dL (ref 0.50–1.35)
GFR calc Af Amer: >90 mL/min (ref >90)
GFR calc Af Amer: >90 mL/min (ref >90)
GFR calc non Af Amer: 85 mL/min — ABNORMAL LOW (ref >90)
GFR calc non Af Amer: >90 mL/min (ref >90)
Glucose, Bld: 122 mg/dL — ABNORMAL HIGH (ref 70–99)
Glucose, Bld: 122 mg/dL — ABNORMAL HIGH (ref 70–99)
Potassium: 3 mEq/L — ABNORMAL LOW (ref 3.5–5.1)
Potassium: 3.9 mEq/L (ref 3.5–5.1)
Sodium: 140 mEq/L (ref 135–145)
Sodium: 143 mEq/L (ref 135–145)

## 2013-01-29 LAB — CULTURE, BLOOD (ROUTINE X 2)
Culture: NO GROWTH
Culture: NO GROWTH

## 2013-01-29 LAB — GLUCOSE, CAPILLARY
Glucose-Capillary: 103 mg/dL — ABNORMAL HIGH (ref 70–99)
Glucose-Capillary: 112 mg/dL — ABNORMAL HIGH (ref 70–99)
Glucose-Capillary: 115 mg/dL — ABNORMAL HIGH (ref 70–99)

## 2013-01-29 LAB — CBC
HCT: 42.4 % (ref 39.0–52.0)
Hemoglobin: 14.2 g/dL (ref 13.0–17.0)
MCH: 30.8 pg (ref 26.0–34.0)
MCHC: 33.5 g/dL (ref 30.0–36.0)
MCV: 92 fL (ref 78.0–100.0)
Platelets: 232 10*3/uL (ref 150–400)
RBC: 4.61 MIL/uL (ref 4.22–5.81)
RDW: 15.7 % — ABNORMAL HIGH (ref 11.5–15.5)
WBC: 13.8 10*3/uL — ABNORMAL HIGH (ref 4.0–10.5)

## 2013-01-29 LAB — HEPARIN LEVEL (UNFRACTIONATED): Heparin Unfractionated: 0.35 IU/mL (ref 0.30–0.70)

## 2013-01-29 MED ORDER — POTASSIUM CHLORIDE CRYS ER 20 MEQ PO TBCR
40.0000 meq | EXTENDED_RELEASE_TABLET | Freq: Once | ORAL | Status: AC
  Administered 2013-01-29: 40 meq via ORAL
  Filled 2013-01-29: qty 2

## 2013-01-29 MED ORDER — ATORVASTATIN CALCIUM 80 MG PO TABS
80.0000 mg | ORAL_TABLET | Freq: Every day | ORAL | Status: DC
  Administered 2013-01-29 – 2013-02-03 (×6): 80 mg via ORAL
  Filled 2013-01-29 (×7): qty 1

## 2013-01-29 MED ORDER — HEPARIN SOD (PORCINE) IN D5W 100 UNIT/ML IV SOLN
INTRAVENOUS | Status: AC
  Filled 2013-01-29: qty 250

## 2013-01-29 MED ORDER — LEVETIRACETAM 500 MG PO TABS
500.0000 mg | ORAL_TABLET | Freq: Two times a day (BID) | ORAL | Status: DC
  Administered 2013-01-29 – 2013-02-04 (×12): 500 mg via ORAL
  Filled 2013-01-29 (×13): qty 1

## 2013-01-29 MED ORDER — CARVEDILOL 6.25 MG PO TABS
6.2500 mg | ORAL_TABLET | Freq: Two times a day (BID) | ORAL | Status: DC
  Administered 2013-01-29 – 2013-02-04 (×10): 6.25 mg via ORAL
  Filled 2013-01-29 (×17): qty 1

## 2013-01-29 MED ORDER — FUROSEMIDE 40 MG PO TABS
40.0000 mg | ORAL_TABLET | Freq: Two times a day (BID) | ORAL | Status: DC
  Administered 2013-01-29 – 2013-01-30 (×2): 40 mg via ORAL
  Filled 2013-01-29 (×3): qty 1

## 2013-01-29 MED ORDER — SPIRONOLACTONE 12.5 MG HALF TABLET
12.5000 mg | ORAL_TABLET | Freq: Every day | ORAL | Status: DC
  Administered 2013-01-29 – 2013-02-04 (×7): 12.5 mg via ORAL
  Filled 2013-01-29 (×7): qty 1

## 2013-01-29 MED ORDER — ASPIRIN 81 MG PO CHEW
81.0000 mg | CHEWABLE_TABLET | Freq: Every day | ORAL | Status: DC
  Administered 2013-01-29 – 2013-01-30 (×2): 81 mg via ORAL
  Filled 2013-01-29 (×2): qty 1

## 2013-01-29 MED ORDER — LISINOPRIL 5 MG PO TABS
5.0000 mg | ORAL_TABLET | Freq: Every day | ORAL | Status: DC
  Administered 2013-01-29 – 2013-01-30 (×2): 5 mg via ORAL
  Filled 2013-01-29 (×2): qty 1

## 2013-01-29 NOTE — Progress Notes (Signed)
ANTICOAGULATION CONSULT NOTE - Follow Up Consult  Pharmacy Consult for heparin Indication: pulmonary embolus, DVT and blood clotting disorder  Allergies  Allergen Reactions  . Ativan (Lorazepam) Anxiety    [name]    Patient Measurements: Height: 5\' 10"  (177.8 cm) Weight: 201 lb 11.5 oz (91.5 kg) IBW/kg (Calculated) : 73   Vital Signs: Temp: 97.8 F (36.6 C) (02/01 0756) Temp src: Oral (02/01 0756) BP: 138/79 mmHg (02/01 1007) Pulse Rate: 83  (02/01 1000)  Labs:  Basename 01/29/13 0500 01/28/13 0530 01/27/13 0545  HGB 14.2 13.9 --  HCT 42.4 42.4 45.9  PLT 232 211 199  APTT -- -- --  LABPROT -- -- 14.4  INR -- -- 1.14  HEPARINUNFRC 0.35 0.37 0.42  CREATININE 0.97 -- 0.89  CKTOTAL -- -- --  CKMB -- -- --  TROPONINI -- -- --    Estimated Creatinine Clearance: 87.5 ml/min (by C-G formula based on Cr of 0.97).   Medications:  Scheduled:    . ampicillin-sulbactam (UNASYN) 1.5 g IVPB  1.5 g Intravenous Q6H  . antiseptic oral rinse  15 mL Mouth Rinse BID  . aspirin  81 mg Oral Daily  . atorvastatin  80 mg Oral q1800  . carvedilol  6.25 mg Oral BID WC  . furosemide  40 mg Oral BID  . levETIRAcetam  500 mg Oral BID  . lidocaine  1 patch Transdermal Daily  . lisinopril  5 mg Oral Daily  . pantoprazole  40 mg Oral Q1200  . [COMPLETED] potassium chloride  40 mEq Oral Once  . [COMPLETED] potassium chloride  40 mEq Oral Once  . spironolactone  12.5 mg Oral Daily  . [DISCONTINUED] aspirin  325 mg Oral Daily  . [DISCONTINUED] furosemide  40 mg Oral Daily  . [DISCONTINUED] metoprolol tartrate  25 mg Oral BID   Infusions:    . sodium chloride Stopped (01/28/13 0410)  . heparin 1,750 Units/hr (01/29/13 1000)  . [DISCONTINUED] sodium chloride Stopped (01/28/13 1115)    Assessment: 67 yo male admitted post-cardiac arrest and now s/p therapeutic hypothermia. Patient with h/o of PE, DVT on Coumadin prior to admission. INR on admit of  3.93. INR on 1/27 was 6.1, and patient then received 5mg  IV phytonadione x 1 and pharmacy to manage IV heparin.   Heparin level today is 0.35 and at goal. Patient noted for cath on Monday 2/3. No bleeding noted. CBC is stable.  Goal of Therapy:  Heparin level 0.3-0.7 units/ml Monitor platelets by anticoagulation protocol: Yes   Plan:  -Continue IV heparin to 1750 units/hr.  -Check a heparin level and CBC with AM labs.  Lillia Pauls, PharmD Clinical Pharmacist 01/29/2013 11:42 AM

## 2013-01-29 NOTE — Progress Notes (Addendum)
Patient ID: Travis Cruz, male   DOB: January 06, 1946, 67 y.o.   MRN: [id number]    SUBJECTIVE: Chest sore from CPR.  Breathing stable but on 5 L oxygen by [place].  Diuresed well yesterday. CVP 7.      . ampicillin-sulbactam (UNASYN) 1.5 g IVPB  1.5 g Intravenous Q6H  . antiseptic oral rinse  15 mL Mouth Rinse BID  . aspirin  81 mg Oral Daily  . carvedilol  6.25 mg Oral BID WC  . furosemide  40 mg Oral Daily  . levETIRAcetam  500 mg Oral BID  . lidocaine  1 patch Transdermal Daily  . lisinopril  5 mg Oral Daily  . pantoprazole  40 mg Oral Q1200  . potassium chloride  40 mEq Oral Once  . potassium chloride  40 mEq Oral Once  heparin gtt    Filed Vitals:   01/29/13 0400 01/29/13 0500 01/29/13 0600 01/29/13 0700  BP: 122/64 110/70 120/69 136/69  Pulse: 89 88 85 85  Temp: 99 F (37.2 C) 98.8 F (37.1 C)    TempSrc:      Resp: 22 21 23 19   Height:      Weight:      SpO2: 91% 91% 92% 91%    Intake/Output Summary (Last 24 hours) at 01/29/13 0732 Last data filed at 01/29/13 0700  Gross per 24 hour  Intake 1588.4 ml  Output   3750 ml  Net -2161.6 ml    LABS: Basic Metabolic Panel:  Basename 01/29/13 0500 01/27/13 0545  NA 140 142  K 3.0* 3.8  CL 101 106  CO2 32 28  GLUCOSE 122* 108*  BUN 7 9  CREATININE 0.97 0.89  CALCIUM 8.8 8.5  MG -- 1.7  PHOS -- --   Liver Function Tests:  Basename 01/27/13 0545  AST 23  ALT 18  ALKPHOS 90  BILITOT 1.0  PROT 5.9*  ALBUMIN 2.5*   No results found for this basename: LIPASE:2,AMYLASE:2 in the last 72 hours CBC:  Basename 01/29/13 0500 01/28/13 0530  WBC 13.8* 14.6*  NEUTROABS -- --  HGB 14.2 13.9  HCT 42.4 42.4  MCV 92.0 92.2  PLT 232 211    PHYSICAL EXAM General: NAD Neck: JVP 8 cm, no thyromegaly or thyroid nodule.  Lungs: Crackles/decreased breath sounds at bases bilaterally CV: Nondisplaced PMI.  Heart regular S1/S2, no S3/S4, no murmur.  1+ ankle edema.  Abdomen: Soft, nontender, no hepatosplenomegaly, no  distention.  Neurologic: Alert and oriented x 3.  Psych: Normal affect. Extremities: No clubbing or cyanosis.   TELEMETRY: Reviewed telemetry pt in NSR  ASSESSMENT AND PLAN:  67 yo with history of COPD, PE/DVT admitted after vfib arrest, was on [place] protocol.  Now awake and extubated.  1. Cardiac arrest: Vfib.  Suspect ischemic, had NSTEMI.  EF 35-40%.  Continue ASA, heparin gtt.  Add statin.  Plan for cath on Monday.  2. CHF: Acute systolic CHF.  EF 35-40%, suspect ischemic CMP.  He has been diuresed and CVP was 5 this morning.  On Lasix 40 po daily with negative I/Os.   - Continue current Lasix and replace K. - Stop metoprolol and use Coreg. - Add lisinopril 5 mg daily/spironolactone 12.5.  3. Pulmonary: ARDS/? Aspiration.  Still with significant oxygen requirement.  Per CVP, volume not markedly elevated (CVP 7).  Would continue gentle Lasix, use 40 mg po bid. He is on Unasyn.  Will get CXR today.   Travis Cruz 01/29/2013 7:36 AM

## 2013-01-30 LAB — BASIC METABOLIC PANEL
BUN: 9 mg/dL (ref 6–23)
CO2: 26 mEq/L (ref 19–32)
Calcium: 9.7 mg/dL (ref 8.4–10.5)
Chloride: 101 mEq/L (ref 96–112)
Creatinine, Ser: 0.87 mg/dL (ref 0.50–1.35)
GFR calc Af Amer: >90 mL/min (ref >90)
GFR calc non Af Amer: 89 mL/min — ABNORMAL LOW (ref >90)
Glucose, Bld: 94 mg/dL (ref 70–99)
Potassium: 3.8 mEq/L (ref 3.5–5.1)
Sodium: 139 mEq/L (ref 135–145)

## 2013-01-30 LAB — CBC
HCT: 48.3 % (ref 39.0–52.0)
Hemoglobin: 16.1 g/dL (ref 13.0–17.0)
MCH: 30.5 pg (ref 26.0–34.0)
MCHC: 33.3 g/dL (ref 30.0–36.0)
MCV: 91.5 fL (ref 78.0–100.0)
Platelets: 263 10*3/uL (ref 150–400)
RBC: 5.28 MIL/uL (ref 4.22–5.81)
RDW: 16 % — ABNORMAL HIGH (ref 11.5–15.5)
WBC: 15.2 10*3/uL — ABNORMAL HIGH (ref 4.0–10.5)

## 2013-01-30 LAB — GLUCOSE, CAPILLARY
Glucose-Capillary: 108 mg/dL — ABNORMAL HIGH (ref 70–99)
Glucose-Capillary: 131 mg/dL — ABNORMAL HIGH (ref 70–99)
Glucose-Capillary: 98 mg/dL (ref 70–99)
Glucose-Capillary: 119 mg/dL — ABNORMAL HIGH (ref 70–99)

## 2013-01-30 LAB — HEPARIN LEVEL (UNFRACTIONATED)
Heparin Unfractionated: 0.66 IU/mL (ref 0.30–0.70)
Heparin Unfractionated: 0.64 IU/mL (ref 0.30–0.70)

## 2013-01-30 MED ORDER — SODIUM CHLORIDE 0.9 % IV SOLN: 250.0000 mL | INTRAVENOUS | Status: DC | PRN

## 2013-01-30 MED ORDER — SODIUM CHLORIDE 0.9 % IJ SOLN: 3.0000 mL | INTRAMUSCULAR | Status: DC | PRN

## 2013-01-30 MED ORDER — ASPIRIN 81 MG PO CHEW
81.0000 mg | CHEWABLE_TABLET | Freq: Every day | ORAL | Status: DC
  Administered 2013-02-01 – 2013-02-04 (×4): 81 mg via ORAL
  Filled 2013-01-30 (×4): qty 1

## 2013-01-30 MED ORDER — FUROSEMIDE 40 MG PO TABS
40.0000 mg | ORAL_TABLET | Freq: Every day | ORAL | Status: DC
  Administered 2013-02-01 – 2013-02-04 (×4): 40 mg via ORAL
  Filled 2013-01-30 (×5): qty 1

## 2013-01-30 MED ORDER — SODIUM CHLORIDE 0.9 % IJ SOLN
3.0000 mL | Freq: Two times a day (BID) | INTRAMUSCULAR | Status: DC
  Administered 2013-01-31: 30 mL via INTRAVENOUS

## 2013-01-30 MED ORDER — ASPIRIN 81 MG PO CHEW
324.0000 mg | CHEWABLE_TABLET | ORAL | Status: AC
  Administered 2013-01-31: 324 mg via ORAL
  Filled 2013-01-30: qty 4

## 2013-01-30 MED ORDER — LISINOPRIL 5 MG PO TABS
5.0000 mg | ORAL_TABLET | Freq: Two times a day (BID) | ORAL | Status: DC
  Administered 2013-01-30 – 2013-02-04 (×9): 5 mg via ORAL
  Filled 2013-01-30 (×11): qty 1

## 2013-01-30 NOTE — Progress Notes (Signed)
Patient ID: [name], male   DOB: 06-Oct-1946, 67 y.o.   MRN: [id number]    SUBJECTIVE: Chest sore from CPR.  Breathing stable but on 4 L oxygen by [place].  Diuresed well again yesterday. CVP 5.      [name] antiseptic oral rinse  15 mL Mouth Rinse BID  . aspirin  81 mg Oral Daily  . atorvastatin  80 mg Oral q1800  . carvedilol  6.25 mg Oral BID WC  . furosemide  40 mg Oral BID  . levETIRAcetam  500 mg Oral BID  . lidocaine  1 patch Transdermal Daily  . lisinopril  5 mg Oral Daily  . pantoprazole  40 mg Oral Q1200  . spironolactone  12.5 mg Oral Daily  heparin gtt    Filed Vitals:   01/30/13 0900 01/30/13 1000 01/30/13 1004 01/30/13 1011  BP:   106/84   Pulse: 90 84  83  Temp:      TempSrc:      Resp:      Height:      Weight:      SpO2: 93% 94%  92%    Intake/Output Summary (Last 24 hours) at 01/30/13 1034 Last data filed at 01/30/13 1000  Gross per 24 hour  Intake    750 ml  Output   2677 ml  Net  -1927 ml    LABS: Basic Metabolic Panel:  Basename 01/30/13 0505 01/29/13 1510  NA 139 143  K 3.8 3.9  CL 101 105  CO2 26 29  GLUCOSE 94 122*  BUN 9 7  CREATININE 0.87 0.81  CALCIUM 9.7 8.8  MG -- --  PHOS -- --   Liver Function Tests: No results found for this basename: AST:2,ALT:2,ALKPHOS:2,BILITOT:2,PROT:2,ALBUMIN:2 in the last 72 hours No results found for this basename: LIPASE:2,AMYLASE:2 in the last 72 hours CBC:  Basename 01/30/13 0505 01/29/13 0500  WBC 15.2* 13.8*  NEUTROABS -- --  HGB 16.1 14.2  HCT 48.3 42.4  MCV 91.5 92.0  PLT 263 232    PHYSICAL EXAM General: NAD Neck: JVP 7 cm, no thyromegaly or thyroid nodule.  Lungs: Crackles at bases bilaterally CV: Nondisplaced PMI.  Heart regular S1/S2, no S3/S4, no murmur.  No edema.  Abdomen: Soft, nontender, no hepatosplenomegaly, no distention.  Neurologic: Alert and oriented x 3.  Psych: Normal affect. Extremities: No clubbing or cyanosis.   TELEMETRY: Reviewed telemetry pt in  NSR  ASSESSMENT AND PLAN:  67 yo with history of COPD, PE/DVT admitted after vfib arrest, was on [place] protocol.  Now awake and extubated.  1. Cardiac arrest: Vfib.  Suspect ischemic, had NSTEMI.  EF 35-40%.  Continue ASA, heparin gtt.  Add statin.  Plan for cath on Monday.  2. CHF: Acute systolic CHF.  EF 35-40%, suspect ischemic CMP.  He has been diuresed and CVP was 5 this morning.   - Decrease Lasix to 40 mg po daily.  - Continue Coreg and spironolactone. - Increase lisinopril to 5 mg bid.  3. Pulmonary: ARDS/? Aspiration.  Still with oxygen requirement.  He does not look volume overloaded now . Suspect aspiration pneumonitis with cardiac arrest.  He completed 7 days of Unasyn and is off antibiotics.  CXR with bilateral airspace opacities yseterday.  No fever.   Marca Ancona 01/30/2013 10:34 AM

## 2013-01-30 NOTE — Progress Notes (Signed)
ANTICOAGULATION CONSULT NOTE - Follow Up Consult  Pharmacy Consult for heparin Indication: pulmonary embolus, DVT and blood clotting disorder  Allergies  Allergen Reactions  . Ativan (Lorazepam) Anxiety    [name]    Patient Measurements: Height: 5\' 10"  (177.8 cm) Weight: 201 lb 11.5 oz (91.5 kg) IBW/kg (Calculated) : 73  Heparin Dosing Weight: 73 kg  Vital Signs: Temp: 97.9 F (36.6 C) (02/02 1128) Temp src: Oral (02/02 1128) BP: 106/84 mmHg (02/02 1004) Pulse Rate: 83  (02/02 1011)  Labs:  Basename 01/30/13 0505 01/29/13 1510 01/29/13 0500 01/28/13 0530  HGB 16.1 -- 14.2 --  HCT 48.3 -- 42.4 42.4  PLT 263 -- 232 211  APTT -- -- -- --  LABPROT -- -- -- --  INR -- -- -- --  HEPARINUNFRC 0.66 -- 0.35 0.37  CREATININE 0.87 0.81 0.97 --  CKTOTAL -- -- -- --  CKMB -- -- -- --  TROPONINI -- -- -- --    Estimated Creatinine Clearance: 97.5 ml/min (by C-G formula based on Cr of 0.87).   Medications:  Scheduled:    . [COMPLETED] ampicillin-sulbactam (UNASYN) 1.5 g IVPB  1.5 g Intravenous Q6H  . antiseptic oral rinse  15 mL Mouth Rinse BID  . aspirin  81 mg Oral Daily  . atorvastatin  80 mg Oral q1800  . carvedilol  6.25 mg Oral BID WC  . furosemide  40 mg Oral Daily  . [COMPLETED] heparin      . levETIRAcetam  500 mg Oral BID  . lidocaine  1 patch Transdermal Daily  . lisinopril  5 mg Oral BID  . pantoprazole  40 mg Oral Q1200  . spironolactone  12.5 mg Oral Daily  . [DISCONTINUED] furosemide  40 mg Oral BID  . [DISCONTINUED] levETIRAcetam  500 mg Oral BID  . [DISCONTINUED] lisinopril  5 mg Oral Daily   Infusions:    . sodium chloride 10 mL/hr at 01/29/13 1900  . heparin 1,750 Units/hr (01/30/13 1000)    Assessment: 67 yo male admitted post-cardiac arrest and now s/p therapeutic hypothermia. Patient with h/o of PE, DVT on Coumadin prior to admission. INR on admit of 3.93. INR on 1/27 was 6.1, and patient then received  5mg  IV phytonadione x 1 and pharmacy to manage IV heparin.   Heparin level today is 0.66 and above goal. Unsure why sudden increase in level. Will only slightly decrease rate since patient had only been slightly above therapeutic level yesterday. Patient noted for cath on Monday 2/3. No bleeding noted. CBC is stable.  Goal of Therapy:  Heparin level 0.3-0.5 units/ml Monitor platelets by anticoagulation protocol: Yes   Plan:  -Decrease IV heparin to 1700 units/hr.  -Check a heparin level in 6 hours and CBC with AM labs.  Lillia Pauls, PharmD Clinical Pharmacist 01/30/2013 12:02 PM

## 2013-01-30 NOTE — Progress Notes (Addendum)
ANTICOAGULATION CONSULT NOTE - Follow Up Consult  Pharmacy Consult for heparin Indication: pulmonary embolus, DVT and blood clotting disorder  Allergies  Allergen Reactions  . Ativan (Lorazepam) Anxiety    [name]    Patient Measurements: Height: 5\' 10"  (177.8 cm) Weight: 201 lb 11.5 oz (91.5 kg) IBW/kg (Calculated) : 73  Heparin Dosing Weight: 73 kg  Vital Signs: Temp: 97.7 F (36.5 C) (02/02 1915) Temp src: Oral (02/02 1915) BP: 119/76 mmHg (02/02 1915) Pulse Rate: 83  (02/02 1915)  Labs:  Basename 01/30/13 1845 01/30/13 0505 01/29/13 1510 01/29/13 0500 01/28/13 0530  HGB -- 16.1 -- 14.2 --  HCT -- 48.3 -- 42.4 42.4  PLT -- 263 -- 232 211  APTT -- -- -- -- --  LABPROT -- -- -- -- --  INR -- -- -- -- --  HEPARINUNFRC 0.64 0.66 -- 0.35 --  CREATININE -- 0.87 0.81 0.97 --  CKTOTAL -- -- -- -- --  CKMB -- -- -- -- --  TROPONINI -- -- -- -- --    Estimated Creatinine Clearance: 97.5 ml/min (by C-G formula based on Cr of 0.87).   Medications:  Scheduled:     . [COMPLETED] ampicillin-sulbactam (UNASYN) 1.5 g IVPB  1.5 g Intravenous Q6H  . antiseptic oral rinse  15 mL Mouth Rinse BID  . aspirin  324 mg Oral Pre-Cath  . aspirin  81 mg Oral Daily  . atorvastatin  80 mg Oral q1800  . carvedilol  6.25 mg Oral BID WC  . furosemide  40 mg Oral Daily  . [COMPLETED] heparin      . levETIRAcetam  500 mg Oral BID  . lidocaine  1 patch Transdermal Daily  . lisinopril  5 mg Oral BID  . pantoprazole  40 mg Oral Q1200  . sodium chloride  3 mL Intravenous Q12H  . spironolactone  12.5 mg Oral Daily  . [DISCONTINUED] aspirin  81 mg Oral Daily  . [DISCONTINUED] furosemide  40 mg Oral BID  . [DISCONTINUED] lisinopril  5 mg Oral Daily   Infusions:     . sodium chloride 10 mL/hr at 01/29/13 1900  . heparin 1,700 Units/hr (01/30/13 1457)    Assessment: 67 yo male admitted post-cardiac arrest and now s/p therapeutic hypothermia.  Patient with h/o of PE, DVT on Coumadin prior to admission. INR on admit of 3.93. INR on 1/27 was 6.1, and patient then received 5mg  IV phytonadione x 1 and pharmacy to manage IV heparin.   Heparin level 0.64 is still above goal (0.3-0.5) after slightly rate decrease this morning. But level was obtained < 4 hrs after rate decrease. Patient noted for cath on Monday 2/3. No bleeding noted. CBC is stable. Originally used lower heparin level goal per MD request due to recent bleeding, CBC has been stable with hgb 16.1, and plt 263, so probably can use standard heparin level goal (0.3-0.7) now.  Goal of Therapy:  Heparin level 0.3-0.5 units/ml Monitor platelets by anticoagulation protocol: Yes   Plan:  -Decrease IV heparin to 1650 units/hr.  -f/u AM heparin level and f/u plans for anticoagulation after cath tomorrow   Bayard Hugger, PharmD, BCPS  Clinical Pharmacist  Pager: [phone]  01/30/2013 7:58 PM

## 2013-01-31 ENCOUNTER — Encounter (HOSPITAL_COMMUNITY)
Admission: EM | Disposition: A | Discharge status: Home Health | Payer: Self-pay | Source: Home / Self Care | Attending: Cardiology

## 2013-01-31 ENCOUNTER — Inpatient Hospital Stay (HOSPITAL_COMMUNITY): Payer: BC Managed Care – PPO

## 2013-01-31 DIAGNOSIS — I2510 Atherosclerotic heart disease of native coronary artery without angina pectoris: Secondary | ICD-10-CM

## 2013-01-31 DIAGNOSIS — I469 Cardiac arrest, cause unspecified: Secondary | ICD-10-CM

## 2013-01-31 HISTORY — PX: LEFT HEART CATHETERIZATION WITH CORONARY ANGIOGRAM: SHX5451

## 2013-01-31 LAB — GLUCOSE, CAPILLARY
Glucose-Capillary: 92 mg/dL (ref 70–99)
Glucose-Capillary: 139 mg/dL — ABNORMAL HIGH (ref 70–99)
Glucose-Capillary: 105 mg/dL — ABNORMAL HIGH (ref 70–99)

## 2013-01-31 LAB — CBC
HCT: 47.2 % (ref 39.0–52.0)
Hemoglobin: 15.9 g/dL (ref 13.0–17.0)
MCH: 30.9 pg (ref 26.0–34.0)
MCHC: 33.7 g/dL (ref 30.0–36.0)
MCV: 91.7 fL (ref 78.0–100.0)
Platelets: 309 10*3/uL (ref 150–400)
RBC: 5.15 MIL/uL (ref 4.22–5.81)
RDW: 15.9 % — ABNORMAL HIGH (ref 11.5–15.5)
WBC: 19.5 10*3/uL — ABNORMAL HIGH (ref 4.0–10.5)

## 2013-01-31 LAB — URINALYSIS, ROUTINE W REFLEX MICROSCOPIC
Bilirubin Urine: NEGATIVE
Glucose, UA: NEGATIVE mg/dL
Ketones, ur: 15 mg/dL — AB
Nitrite: NEGATIVE
Protein, ur: NEGATIVE mg/dL
Specific Gravity, Urine: >1.046 — ABNORMAL HIGH (ref 1.005–1.030)
Urobilinogen, UA: 1 mg/dL (ref 0.0–1.0)
pH: 7.5 (ref 5.0–8.0)

## 2013-01-31 LAB — URINE MICROSCOPIC-ADD ON

## 2013-01-31 LAB — BASIC METABOLIC PANEL
BUN: 15 mg/dL (ref 6–23)
CO2: 29 mEq/L (ref 19–32)
Calcium: 9.5 mg/dL (ref 8.4–10.5)
Chloride: 102 mEq/L (ref 96–112)
Creatinine, Ser: 0.9 mg/dL (ref 0.50–1.35)
GFR calc Af Amer: >90 mL/min (ref >90)
GFR calc non Af Amer: 88 mL/min — ABNORMAL LOW (ref >90)
Glucose, Bld: 105 mg/dL — ABNORMAL HIGH (ref 70–99)
Potassium: 3.9 mEq/L (ref 3.5–5.1)
Sodium: 139 mEq/L (ref 135–145)

## 2013-01-31 LAB — HEPARIN LEVEL (UNFRACTIONATED): Heparin Unfractionated: 0.69 IU/mL (ref 0.30–0.70)

## 2013-01-31 SURGERY — LEFT HEART CATHETERIZATION WITH CORONARY ANGIOGRAM
Anesthesia: LOCAL

## 2013-01-31 SURGERY — LEFT HEART CATH
Anesthesia: Moderate Sedation

## 2013-01-31 MED ORDER — LIDOCAINE HCL (PF) 1 % IJ SOLN
INTRAMUSCULAR | Status: AC
  Filled 2013-01-31: qty 30

## 2013-01-31 MED ORDER — HEPARIN (PORCINE) IN NACL 100-0.45 UNIT/ML-% IJ SOLN
1650.0000 [IU]/h | INTRAMUSCULAR | Status: DC
  Administered 2013-01-31 – 2013-02-01 (×2): 1650 [IU]/h via INTRAVENOUS
  Filled 2013-01-31 (×3): qty 250

## 2013-01-31 MED ORDER — SODIUM CHLORIDE 0.9 % IV SOLN: INTRAVENOUS | Status: AC

## 2013-01-31 MED ORDER — ONDANSETRON HCL 4 MG/2ML IJ SOLN: 4.0000 mg | Freq: Four times a day (QID) | INTRAMUSCULAR | Status: DC | PRN

## 2013-01-31 MED ORDER — MIDAZOLAM HCL 2 MG/2ML IJ SOLN
INTRAMUSCULAR | Status: AC
  Filled 2013-01-31: qty 2

## 2013-01-31 MED ORDER — NITROGLYCERIN 1 MG/10 ML FOR IR/CATH LAB
INTRA_ARTERIAL | Status: AC
  Filled 2013-01-31: qty 10

## 2013-01-31 MED ORDER — ACETAMINOPHEN 325 MG PO TABS: 650.0000 mg | ORAL_TABLET | ORAL | Status: DC | PRN

## 2013-01-31 MED ORDER — HEPARIN (PORCINE) IN NACL 2-0.9 UNIT/ML-% IJ SOLN
INTRAMUSCULAR | Status: AC
  Filled 2013-01-31: qty 1500

## 2013-01-31 NOTE — CV Procedure (Signed)
  Cardiac Catheterization Procedure Note  Name: Travis Cruz MRN: [id number] DOB: [birth year]  Procedure: Left Heart Cath, Selective Coronary Angiography, LV angiography  Indication:  V fib arrest, ischemic cardiomyopathy, NQWMI   Procedural details: The right groin was prepped, draped, and anesthetized with 1% lidocaine. Using modified Seldinger technique, a 5 French sheath was introduced into the right femoral artery. Standard Judkins catheters were used for coronary angiography and left ventriculography. Catheter exchanges were performed over a guidewire. There were no immediate procedural complications. The patient was transferred to the post catheterization recovery area for further monitoring.  Procedural Findings:   Hemodynamics:     AO 112/71    LV 116/11   Coronary angiography:   Coronary dominance: Right  Left mainstem:   Normal  Left anterior descending (LAD):   Large vessel that wraps the apex.  Fixed 40% stenosis at D1.  Moderate diffuse apical nonobstructive CAD  Left circumflex (LCx):  Large vessel in the AV groove with mild luminal irregularities.  MOM very small with subtotal stenosis.  Ramus intermedius (RI):  Large.  Ostial 25%.    Right coronary artery (RCA):  Dominant.  Long proximal 95% stenosis followed by an aneurysmal dilatation and then mid occlusion.  Left to right moderate collaterals.     Left ventriculography: Left ventricular systolic with inferior akinesis, LVEF is estimated at 55%, there is no significant mitral regurgitation   Final Conclusions:  Severe single vessel CAD.  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  Otherwise small vessel disease and nonobstructive large vessel disease.  Recommendations: Medical management and risk reduction.  I will discuss ICD placement with EP.    Rollene Rotunda 01/31/2013, 8:11 AM

## 2013-01-31 NOTE — Interval H&P Note (Signed)
History and Physical Interval Note:  01/31/2013 7:46 AM  Travis Cruz  has presented today for surgery, with the diagnosis of cp  The various methods of treatment have been discussed with the patient and family. After consideration of risks, benefits and other options for treatment, the patient has consented to  Procedure(s) (LRB) with comments: LEFT HEART CATHETERIZATION WITH CORONARY ANGIOGRAM (N/A) as a surgical intervention .  The patient's history has been reviewed, patient examined, no change in status, stable for surgery.  I have reviewed the patient's chart and labs.  Questions were answered to the patient's satisfaction.     Rollene Rotunda

## 2013-01-31 NOTE — H&P (View-Only) (Signed)
Patient ID: Travis Cruz, male   DOB: 04/29/1946, 66 y.o.   MRN: [id number]    SUBJECTIVE: Chest sore from CPR.  Breathing stable but on 4 L oxygen by [facility].  Diuresed well again yesterday. CVP 5.      . antiseptic oral rinse  15 mL Mouth Rinse BID  . aspirin  81 mg Oral Daily  . atorvastatin  80 mg Oral q1800  . carvedilol  6.25 mg Oral BID WC  . furosemide  40 mg Oral BID  . levETIRAcetam  500 mg Oral BID  . lidocaine  1 patch Transdermal Daily  . lisinopril  5 mg Oral Daily  . pantoprazole  40 mg Oral Q1200  . spironolactone  12.5 mg Oral Daily  heparin gtt    Filed Vitals:   01/30/13 0900 01/30/13 1000 01/30/13 1004 01/30/13 1011  BP:   106/84   Pulse: 90 84  83  Temp:      TempSrc:      Resp:      Height:      Weight:      SpO2: 93% 94%  92%    Intake/Output Summary (Last 24 hours) at 01/30/13 1034 Last data filed at 01/30/13 1000  Gross per 24 hour  Intake    750 ml  Output   2677 ml  Net  -1927 ml    LABS: Basic Metabolic Panel:  Basename 01/30/13 0505 01/29/13 1510  NA 139 143  K 3.8 3.9  CL 101 105  CO2 26 29  GLUCOSE 94 122*  BUN 9 7  CREATININE 0.87 0.81  CALCIUM 9.7 8.8  MG -- --  PHOS -- --   Liver Function Tests: No results found for this basename: AST:2,ALT:2,ALKPHOS:2,BILITOT:2,PROT:2,ALBUMIN:2 in the last 72 hours No results found for this basename: LIPASE:2,AMYLASE:2 in the last 72 hours CBC:  Basename 01/30/13 0505 01/29/13 0500  WBC 15.2* 13.8*  NEUTROABS -- --  HGB 16.1 14.2  HCT 48.3 42.4  MCV 91.5 92.0  PLT 263 232    PHYSICAL EXAM General: NAD Neck: JVP 7 cm, no thyromegaly or thyroid nodule.  Lungs: Crackles at bases bilaterally CV: Nondisplaced PMI.  Heart regular S1/S2, no S3/S4, no murmur.  No edema.  Abdomen: Soft, nontender, no hepatosplenomegaly, no distention.  Neurologic: Alert and oriented x 3.  Psych: Normal affect. Extremities: No clubbing or cyanosis.   TELEMETRY: Reviewed telemetry pt in  NS[facility]  ASSESSMENT AND PLAN:  66 yo with history of COPD, PE/DVT admitted after vfib arrest, was on [place] protocol.  Now awake and extubated.  1. Cardiac arrest: Vfib.  Suspect ischemic, had NSTEMI.  EF 35-40%.  Continue ASA, heparin gtt.  Add statin.  Plan for cath on Monday.  2. CHF: Acute systolic CHF.  EF 35-40%, suspect ischemic CMP.  He has been diuresed and CVP was 5 this morning.   - Decrease Lasix to 40 mg po daily.  - Continue Coreg and spironolactone. - Increase lisinopril to 5 mg bid.  3. Pulmonary: ARDS/? Aspiration.  Still with oxygen requirement.  He does not look volume overloaded now . Suspect aspiration pneumonitis with cardiac arrest.  He completed 7 days of Unasyn and is off antibiotics.  CXR with bilateral airspace opacities yseterday.  No fever.   [name] 01/30/2013 10:34 AM   

## 2013-01-31 NOTE — Progress Notes (Signed)
PT Cancellation Note  Patient Details Name: Travis Cruz MRN: [id number] DOB: 03-15-1946   Cancelled Treatment:    Reason Eval/Treat Not Completed: Medical issues which prohibited therapy: Bed rest from heart cath. Will f/u tomorrow.    [facility] HELEN 01/31/2013, 10:51 AM Pager: [phone]

## 2013-01-31 NOTE — Progress Notes (Signed)
Patient experiencing hematuria this morning upon voiding. Bright red blood noted around urethra and present on penis. Heparin drip turned off in preparation for this morning's cath. Theodore Demark, PAC notified. New orders obtained. Day shift RN provided with an update.

## 2013-01-31 NOTE — Consult Note (Signed)
Pt with unexplained VF int he setting of old RCA occlusion and modest depression of LV function. He will need an ICD for secondary prevention.  His WBC is going up so we will defer implant until that issue begins to reverse.   Have reviewed with family No driving x 6 months

## 2013-01-31 NOTE — Clinical Social Work Note (Signed)
Clinical Social Worker spoke with patient and family member at bedside regarding responses received so far from the SNF referral. Patient reported that if patient in unable to be admitted into CIR, then [facility] would be preferred facility. CSW provided patient with response list from the SNF search. CSW will continue to follow.  Rozetta Nunnery MSW, [employer] [phone]

## 2013-01-31 NOTE — Progress Notes (Signed)
OT Cancellation Note  Patient Details Name: Travis Cruz MRN: [id number] DOB: 02/20/1946   Cancelled Treatment:    Reason Eval/Treat Not Completed: Patient at procedure or test/ unavailable (heart cath today)  [facility] Tarri Guilfoil, OTR/L  [phone] 01/31/2013 01/31/2013, 11:32 AM
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
## 2013-02-01 LAB — BASIC METABOLIC PANEL
BUN: 16 mg/dL (ref 6–23)
CO2: 29 mEq/L (ref 19–32)
Calcium: 9.1 mg/dL (ref 8.4–10.5)
Chloride: 106 mEq/L (ref 96–112)
Creatinine, Ser: 0.79 mg/dL (ref 0.50–1.35)
GFR calc Af Amer: >90 mL/min (ref >90)
GFR calc non Af Amer: >90 mL/min (ref >90)
Glucose, Bld: 109 mg/dL — ABNORMAL HIGH (ref 70–99)
Potassium: 4 mEq/L (ref 3.5–5.1)
Sodium: 141 mEq/L (ref 135–145)

## 2013-02-01 LAB — CBC
HCT: 45.5 % (ref 39.0–52.0)
HCT: 45.8 % (ref 39.0–52.0)
Hemoglobin: 14.7 g/dL (ref 13.0–17.0)
Hemoglobin: 14.9 g/dL (ref 13.0–17.0)
MCH: 29.9 pg (ref 26.0–34.0)
MCH: 29.9 pg (ref 26.0–34.0)
MCHC: 32.3 g/dL (ref 30.0–36.0)
MCHC: 32.5 g/dL (ref 30.0–36.0)
MCV: 92 fL (ref 78.0–100.0)
MCV: 92.7 fL (ref 78.0–100.0)
Platelets: 306 10*3/uL (ref 150–400)
Platelets: 322 10*3/uL (ref 150–400)
RBC: 4.91 MIL/uL (ref 4.22–5.81)
RBC: 4.98 MIL/uL (ref 4.22–5.81)
RDW: 16.1 % — ABNORMAL HIGH (ref 11.5–15.5)
RDW: 16.1 % — ABNORMAL HIGH (ref 11.5–15.5)
WBC: 17.5 10*3/uL — ABNORMAL HIGH (ref 4.0–10.5)
WBC: 18.3 10*3/uL — ABNORMAL HIGH (ref 4.0–10.5)

## 2013-02-01 LAB — HEPARIN LEVEL (UNFRACTIONATED)
Heparin Unfractionated: 0.45 IU/mL (ref 0.30–0.70)
Heparin Unfractionated: 0.57 IU/mL (ref 0.30–0.70)

## 2013-02-01 LAB — GLUCOSE, CAPILLARY
Glucose-Capillary: 97 mg/dL (ref 70–99)
Glucose-Capillary: 105 mg/dL — ABNORMAL HIGH (ref 70–99)

## 2013-02-01 MED ORDER — SODIUM CHLORIDE 0.9 % IJ SOLN
10.0000 mL | INTRAMUSCULAR | Status: DC | PRN
  Administered 2013-02-03: 10 mL
  Administered 2013-02-03 – 2013-02-04 (×2): 20 mL

## 2013-02-01 MED ORDER — SODIUM CHLORIDE 0.9 % IR SOLN
80.0000 mg | Status: AC
  Filled 2013-02-01: qty 2

## 2013-02-01 MED ORDER — WARFARIN - PHARMACIST DOSING INPATIENT
Freq: Every day | Status: DC
  Administered 2013-02-02: 20:00:00

## 2013-02-01 MED ORDER — SODIUM CHLORIDE 0.9 % IV SOLN
INTRAVENOUS | Status: DC
  Administered 2013-02-02: 20 mL/h via INTRAVENOUS

## 2013-02-01 MED ORDER — SODIUM CHLORIDE 0.9 % IJ SOLN
3.0000 mL | Freq: Two times a day (BID) | INTRAMUSCULAR | Status: DC
  Administered 2013-02-01: 3 mL via INTRAVENOUS

## 2013-02-01 MED ORDER — CHLORHEXIDINE GLUCONATE 4 % EX LIQD
60.0000 mL | Freq: Once | CUTANEOUS | Status: AC
  Administered 2013-02-02: 4 via TOPICAL
  Filled 2013-02-01: qty 60

## 2013-02-01 MED ORDER — CHLORHEXIDINE GLUCONATE 4 % EX LIQD
60.0000 mL | Freq: Once | CUTANEOUS | Status: AC
  Administered 2013-02-01: 4 via TOPICAL
  Filled 2013-02-01: qty 60

## 2013-02-01 MED ORDER — SODIUM CHLORIDE 0.9 % IJ SOLN: 10.0000 mL | Freq: Two times a day (BID) | INTRAMUSCULAR | Status: DC

## 2013-02-01 MED ORDER — SODIUM CHLORIDE 0.9 % IJ SOLN: 3.0000 mL | INTRAMUSCULAR | Status: DC | PRN

## 2013-02-01 MED ORDER — SODIUM CHLORIDE 0.9 % IR SOLN
80.0000 mg | Status: DC
  Filled 2013-02-01: qty 2

## 2013-02-01 MED ORDER — WARFARIN SODIUM 6 MG PO TABS
6.0000 mg | ORAL_TABLET | Freq: Once | ORAL | Status: AC
  Administered 2013-02-01: 6 mg via ORAL
  Filled 2013-02-01: qty 1

## 2013-02-01 MED ORDER — CEFAZOLIN SODIUM-DEXTROSE 2-3 GM-% IV SOLR
2.0000 g | INTRAVENOUS | Status: DC
  Filled 2013-02-01: qty 50

## 2013-02-01 MED ORDER — CEFAZOLIN SODIUM-DEXTROSE 2-3 GM-% IV SOLR
2.0000 g | INTRAVENOUS | Status: AC
  Filled 2013-02-01: qty 50

## 2013-02-01 NOTE — Progress Notes (Signed)
ANTICOAGULATION CONSULT NOTE - Follow Up Consult  Pharmacy Consult for heparin Indication: pulmonary embolus, DVT and blood clotting disorder  Labs:  Basename 02/01/13 0045 02/01/13 0034 01/31/13 0535 01/30/13 1845 01/30/13 0505 01/29/13 1510  HGB 14.9 -- 15.9 -- -- --  HCT 45.8 -- 47.2 -- 48.3 --  PLT 306 -- 309 -- 263 --  APTT -- -- -- -- -- --  LABPROT -- -- -- -- -- --  INR -- -- -- -- -- --  HEPARINUNFRC -- 0.45 0.69 0.64 -- --  CREATININE -- -- 0.90 -- 0.87 0.81  CKTOTAL -- -- -- -- -- --  CKMB -- -- -- -- -- --  TROPONINI -- -- -- -- -- --    Assessment/Plan:  67yo male therapeutic on heparin after resumed post-cath.  Will continue gtt at current rate and confirm stable with additional level.  Colleen Can PharmD BCPS 02/01/2013,1:24 AM

## 2013-02-01 NOTE — Progress Notes (Signed)
NUTRITION FOLLOW UP  Intervention:   1.  No nutrition interventions at this time.  2. RD will continue to follow    Nutrition Dx:   Inadequate oral intake related to inability to eat as evidenced by NPO status. Resolved with diet advance and 100% meal completion  Goal: Diet advance to meet >/=90% estimated nutrition needs. Met   Monitor:   Resp. Status, Po intake, weight trends, I/O's  Assessment:   Pt on Dysphagia 3 thin diet. Eating 100% of meals. Planned for ICD placement tomorrow.   Height: Ht Readings from Last 1 Encounters:  01/23/13 5\' 10"  (1.778 m)    Weight Status:   Wt Readings from Last 1 Encounters:  01/28/13 201 lb 11.5 oz (91.5 kg)  trending back down near admission weight   Re-estimated needs:  Kcal: 2100-2300 Protein: 100-115 gm  Fluid: >/=2 L   Skin: several skin tears  Diet Order: Dysphagia 3, thin   Intake/Output Summary (Last 24 hours) at 02/01/13 1142 Last data filed at 02/01/13 0900  Gross per 24 hour  Intake 2115.83 ml  Output    835 ml  Net 1280.83 ml    Last BM: 2/3    Labs:   Lab 02/01/13 0034 01/31/13 0535 01/30/13 0505 01/27/13 0545 01/26/13 0430  NA 141 139 139 -- --  K 4.0 3.9 3.8 -- --  CL 106 102 101 -- --  CO2 29 29 26  -- --  BUN 16 15 9  -- --  CREATININE 0.79 0.90 0.87 -- --  CALCIUM 9.1 9.5 9.7 -- --  MG -- -- -- 1.7 1.9  PHOS -- -- -- -- --  GLUCOSE 109* 105* 94 -- --    CBG (last 3)   Basename 02/01/13 0831 01/31/13 2114 01/31/13 1642  GLUCAP 97 105* 139*    Scheduled Meds:    . antiseptic oral rinse  15 mL Mouth Rinse BID  . aspirin  81 mg Oral Daily  . atorvastatin  80 mg Oral q1800  . carvedilol  6.25 mg Oral BID WC  . furosemide  40 mg Oral Daily  . levETIRAcetam  500 mg Oral BID  . lidocaine  1 patch Transdermal Daily  . lisinopril  5 mg Oral BID  . pantoprazole  40 mg Oral Q1200  . sodium chloride  3 mL Intravenous Q12H  . spironolactone  12.5 mg Oral Daily    Continuous Infusions:     . sodium chloride 20 mL/hr at 01/31/13 1900  . sodium chloride      Clarene Duke RD, LDN Pager [phone] After Hours pager [phone]

## 2013-02-01 NOTE — Progress Notes (Signed)
Peripherally Inserted Central Catheter/Midline Placement  The IV Nurse has discussed with the patient and/or persons authorized to consent for the patient, the purpose of this procedure and the potential benefits and risks involved with this procedure.  The benefits include less needle sticks, lab draws from the catheter and patient may be discharged home with the catheter.  Risks include, but not limited to, infection, bleeding, blood clot (thrombus formation), and puncture of an artery; nerve damage and irregular heat beat.  Alternatives to this procedure were also discussed.  PICC/Midline Placement Documentation        Travis Cruz 02/01/2013, 2:06 PM

## 2013-02-01 NOTE — Progress Notes (Signed)
Peripherally Inserted Central Catheter/Midline Placement  The IV Nurse has discussed with the patient and/or persons authorized to consent for the patient, the purpose of this procedure and the potential benefits and risks involved with this procedure.  The benefits include less needle sticks, lab draws from the catheter and patient may be discharged home with the catheter.  Risks include, but not limited to, infection, bleeding, blood clot (thrombus formation), and puncture of an artery; nerve damage and irregular heat beat.  Alternatives to this procedure were also discussed.  PICC/Midline Placement Documentation        Timmothy Sours 02/01/2013, 2:07 PM

## 2013-02-01 NOTE — Progress Notes (Signed)
CIR Admissions follow-up visit. Pt continues with medical issues and not stable for CIR at this time. Met with pt's daughter to discuss d/c options. She is aware that pt's insurance will have to approve admission to CIR after he is medically stable. She is in agreement with SNF as an option if pt unable to come to CIR.  Please call [phone] for questions.

## 2013-02-01 NOTE — Progress Notes (Signed)
Occupational Therapy Treatment Patient Details Name: Travis Cruz MRN: [id number] DOB: [birth year] Today's Date: 02/01/2013 Time: 4098-1191 OT Time Calculation (min): 30 min  OT Assessment / Plan / Recommendation Comments on Treatment Session Pt making excellent progress. Pt overall minguard for tranfsers and min A for ADL. Pt's wife feels that pt is at his baseline cognitive level. Pt limited by fatigue/poor endurance. Feel pt will continue to benefit from CIR to safety return home with intermittent S of family.    Follow Up Recommendations  CIR    Barriers to Discharge   none    Equipment Recommendations  3 in 1 bedside comode    Recommendations for Other Services  none  Frequency Min 2X/week   Plan Discharge plan remains appropriate    Precautions / Restrictions Precautions Precautions: Fall Restrictions Weight Bearing Restrictions: No   Pertinent Vitals/Pain C/o chest pain from CPR    ADL  Grooming: Teeth care;Wash/dry hands;Wash/dry face;Supervision/safety Where Assessed - Grooming: Unsupported standing Equipment Used: Gait belt;Rolling walker Transfers/Ambulation Related to ADLs: minguard ADL Comments: good performance. Improved endurance for ADl. Able demonstrate good safety, probelm solving and judgement during ADL task. wife feels that pt is back to baseline.    OT Diagnosis:    OT Problem List:   OT Treatment Interventions:     OT Goals Acute Rehab OT Goals OT Goal Formulation: With patient/family Time For Goal Achievement: 02/10/13 Potential to Achieve Goals: Good ADL Goals Pt Will Perform Grooming: with supervision;Standing at sink ADL Goal: Grooming - Progress: Met Pt Will Perform Upper Body Bathing: with supervision;Sitting, chair;Sitting, edge of bed ADL Goal: Upper Body Bathing - Progress: Progressing toward goals Pt Will Perform Lower Body Bathing: with supervision;Sit to stand from chair;Sit to stand from bed ADL Goal: Lower Body Bathing -  Progress: Progressing toward goals Pt Will Perform Upper Body Dressing: with supervision;Sitting, chair;Sitting, bed ADL Goal: Upper Body Dressing - Progress: Progressing toward goals Pt Will Perform Lower Body Dressing: with supervision;Sit to stand from chair;Sit to stand from bed ADL Goal: Lower Body Dressing - Progress: Progressing toward goals Pt Will Transfer to Toilet: with supervision;Ambulation;with DME;Comfort height toilet ADL Goal: Toilet Transfer - Progress: Progressing toward goals Pt Will Perform Toileting - Clothing Manipulation: with supervision;Standing ADL Goal: Toileting - Clothing Manipulation - Progress: Progressing toward goals Miscellaneous OT Goals Miscellaneous OT Goal #1: Pt will perform all functional mobility at supervision level. OT Goal: Miscellaneous Goal #1 - Progress: Progressing toward goals Miscellaneous OT Goal #2: Pt will perform bed mobility at supervision level as precursor for EOB ADLs. OT Goal: Miscellaneous Goal #2 - Progress: Progressing toward goals  Visit Information  Last OT Received On: 02/01/13 Assistance Needed: +1    Subjective Data   I'm feeling much better   Prior Functioning       Cognition  Cognition Overall Cognitive Status: Appears within functional limits for tasks assessed/performed Arousal/Alertness: Awake/alert Orientation Level: Appears intact for tasks assessed Behavior During Session: [facility] for tasks performed    Mobility  Bed Mobility Bed Mobility: Sit to Supine Supine to Sit: 4: Min assist;HOB elevated Sit to Supine: HOB elevated;5: Supervision Details for Bed Mobility Assistance: supervision for lines/cords, cues for positioning Transfers Transfers: Sit to Stand;Stand to Sit Sit to Stand: 4: Min guard;From bed;With upper extremity assist Stand to Sit: 4: Min guard;To bed;With upper extremity assist Details for Transfer Assistance: cues for hand placement, gaurding for stability as he rose to standing with RW in  front    Exercises  General Exercises - Upper Extremity Shoulder Flexion: AROM;Both Shoulder Extension: AROM;Both   Balance Balance Balance Assessed: Yes Static Sitting Balance Static Sitting - Balance Support: No upper extremity supported Static Sitting - Level of Assistance: 5: Stand by assistance Static Sitting - Comment/# of Minutes: stood at the sink while performing ADLs x 5 minutes Static Standing Balance Static Standing - Balance Support: Left upper extremity supported;No upper extremity supported Static Standing - Level of Assistance: 5: Stand by assistance Static Standing - Comment/# of Minutes: 10 High Level Balance High Level Balance Comments: Able to stand and brush his teeth with supervision   End of Session OT - End of Session Equipment Utilized During Treatment: Gait belt Activity Tolerance: Patient tolerated treatment well Patient left: in bed;with call bell/phone within reach Nurse Communication: Mobility status  GO     Vallery Mcdade,HILLARY 02/01/2013, 4:04 PM [facility], OTR/L  [phone] 02/01/2013

## 2013-02-01 NOTE — Progress Notes (Signed)
Patient: Travis Cruz Date of Encounter: 02/01/2013, 7:59 AM Admit date: 01/22/2013     Subjective  Travis Cruz reports persistent chest "soreness" since CPR. He denies SOB, palpitations. He was up to bedside chair yesterday.    Objective  Physical Exam: Vitals: BP 101/64  Pulse 85  Temp 97.4 F (36.3 C) (Oral)  Resp 18  Ht 5\' 10"  (1.778 m)  Wt 201 lb 11.5 oz (91.5 kg)  BMI 28.94 kg/m2  SpO2 87% General: Well developed, well appearing 67 year old male in no acute distress. Neck: Supple. JVD not elevated. Lungs: Clear bilaterally to auscultation without wheezes, rales, or rhonchi. Breathing is unlabored. Heart: RRR S1 S2 without murmur, rub or gallop.  Abdomen: Soft, non-distended. Extremities: No clubbing or cyanosis. No edema.  Distal pedal pulses are 2+ and equal bilaterally. Neuro: Alert and oriented X 3. Moves all extremities spontaneously. No focal deficits.  Intake/Output:  Intake/Output Summary (Last 24 hours) at 02/01/13 0759 Last data filed at 02/01/13 0600  Gross per 24 hour  Intake 1968.83 ml  Output    860 ml  Net 1108.83 ml    Inpatient Medications:     . antiseptic oral rinse  15 mL Mouth Rinse BID  . aspirin  81 mg Oral Daily  . atorvastatin  80 mg Oral q1800  . carvedilol  6.25 mg Oral BID WC  . furosemide  40 mg Oral Daily  . levETIRAcetam  500 mg Oral BID  . lidocaine  1 patch Transdermal Daily  . lisinopril  5 mg Oral BID  . pantoprazole  40 mg Oral Q1200  . sodium chloride  3 mL Intravenous Q12H  . spironolactone  12.5 mg Oral Daily      . sodium chloride 20 mL/hr at 01/31/13 1900  . heparin 1,650 Units/hr (02/01/13 0526)    Labs:  [facility] 02/01/13 0034 01/31/13 0535  NA 141 139  K 4.0 3.9  CL 106 102  CO2 29 29  GLUCOSE 109* 105*  BUN 16 15  CREATININE 0.79 0.90  CALCIUM 9.1 9.5  MG -- --  PHOS -- --    Basename 02/01/13 0045 01/31/13 0535  WBC 17.5* 19.5*  NEUTROABS -- --  HGB 14.9 15.9  HCT 45.8 47.2  MCV  92.0 91.7  PLT 306 309    Radiology/Studies: Dg Chest Port 1 View  01/31/2013  *RADIOLOGY REPORT*  Clinical Data: Follow up infiltrates  PORTABLE CHEST - 1 VIEW  Comparison: Portable exam 1514 hours compared to 01/29/2013  Findings: Left subclavian central venous catheter with tip projecting over SVC. Stable heart size and mediastinal contours. Bilateral pulmonary infiltrates improved since previous exam. Low lung volumes with bibasilar atelectasis and probable small left pleural effusion. Minimal peribronchial thickening. No pneumothorax or acute osseous findings.  IMPRESSION: Bronchitic changes with bibasilar atelectasis and small left pleural effusion. Improved bilateral pulmonary infiltrates.   Original Report Authenticated By: Ulyses Southward, M.D.     Telemetry: sinus rhythm with occasional PVCs/couplets; no sustained ventricular arrhythmias    Assessment and Plan  1. VF arrest  2. Ischemic CM, EF 35-40% (history of LV dysfunction dating back to June 2012 at which time he had an echo done at [facility] - EF 40-45% then)  3. CAD, as outlined above  3. Acute systolic HF  4. Polycythemia  5. COPD  6. Lung nodule, LUL - ? clinical significance - radiologist recommended repeat CT after resolution of acute process (previous right-sided lung nodules noted on  chest CT done at [facility] back in 2011)  7. Prior CVA Travis Cruz is s/p witnessed VF arrest. His cardiac cath shows single vessel CAD with evidence of old inferior MI and nonobstructive or small vessel disease which will be managed medically. On admission his K and Mg were within normal range. There are no reversible causes identified. He meets criteria for ICD therapy as secondary prevention of SCD. Risks, benefits and alternatives to ICD were discussed in detail with Travis Cruz and his family today. These risks include, but are not limited to, bleeding, infection, pneumothorax, perforation, tamponade, vascular damage, renal failure, MI, lead  dislodgement, stroke and death. Travis Cruz and his family expressed verbal understanding and would like to proceed with ICD implantation. ? transfer to tele today  Timing of ICD implant and further recs per Dr. Johney Frame. Signed, EDMISTEN, BROOKE PA-C  I have seen, examined the patient, and reviewed the above assessment and plan.  Changes to above are made where necessary.  Pt with VF arrest.  I agree with Dr Graciela Husbands that he meets criteria for ICD implantation for secondary prevention.  Dr Graciela Husbands will plan to proceed with ICD implant tomorrow. I will have L sided CVL removed today.  In addition, will stop heparin and restart coumadin.  He is on coumadin chronically for DVT/PTE which occurred in 2009 and has a PFO and prior stroke also. He will transfer to telemetry.  Co Sign: Hillis Range, MD 02/01/2013 11:23 AM

## 2013-02-01 NOTE — Progress Notes (Signed)
Physical Therapy Treatment Patient Details Name: Travis Cruz MRN: [id number] DOB: 1946-10-14 Today's Date: 02/01/2013 Time: 4098-1191 PT Time Calculation (min): 30 min  PT Assessment / Plan / Recommendation Comments on Treatment Session  s/p out-of-hospital pulseless Vfib arrest requiring defibrillation. 1/25-1/27 [place] protocol completed. UGI bleed on arrival to ICU. Extubated 1/29. Plan for ICD 02/02/13. Has made great progress since evaluation now at [facility] for ambulation and transfers. Cognition also improved (per wife back at baseline). Will still need post acute rehab to progress pt to modified independent level prior to d/c home as he stays at home alone during the day.     Follow Up Recommendations  CIR     Does the patient have the potential to tolerate intense rehabilitation     Barriers to Discharge        Equipment Recommendations  None recommended by PT    Recommendations for Other Services    Frequency     Plan Discharge plan remains appropriate;Frequency remains appropriate    Precautions / Restrictions Precautions Precautions: Fall Restrictions Weight Bearing Restrictions: No   Pertinent Vitals/Pain HR 93 during standing activities, up to 103 following ambulation; on 4 liters O2 throughout session    Mobility  Bed Mobility Bed Mobility: Sit to Supine Sit to Supine: HOB elevated;5: Supervision (30 degrees) Details for Bed Mobility Assistance: supervision for lines/cords, cues for positioning Transfers Transfers: Sit to Stand;Stand to Sit Sit to Stand: 4: Min guard;From bed;With upper extremity assist Stand to Sit: 4: Min guard;To bed;With upper extremity assist Details for Transfer Assistance: cues for hand placement, gaurding for stability as he rose to standing with RW in front Ambulation/Gait Ambulation/Gait Assistance: 4: Min guard Ambulation Distance (Feet): 180 Feet Assistive device: Rolling walker Ambulation/Gait Assistance Details: cues  for tall posture and forward gaze Gait Pattern: Decreased stride length;Trunk flexed General Gait Details: decreased step height bilaterally    Exercises  verbal education provided to perform ankle pumps and LAQ 2x/day and to ambulate with nursing staff     PT Goals Acute Rehab PT Goals Pt will go Supine/Side to Sit: Independently;with HOB 0 degrees PT Goal: Supine/Side to Sit - Progress: Updated due to goal met Pt will go Sit to Supine/Side: Independently;with HOB 0 degrees PT Goal: Sit to Supine/Side - Progress: Updated due to goal met Pt will go Sit to Stand: with modified independence PT Goal: Sit to Stand - Progress: Updated due to goal met Pt will go Stand to Sit: with modified independence PT Goal: Stand to Sit - Progress: Updated due to goals met Pt will Transfer Bed to Chair/Chair to Bed: with modified independence PT Transfer Goal: Bed to Chair/Chair to Bed - Progress: Updated due to goal met Pt will Stand: Independently;3 - 5 min;with no upper extremity support PT Goal: Stand - Progress: Updated due to goal met Pt will Ambulate: >150 feet;with modified independence;with least restrictive assistive device PT Goal: Ambulate - Progress: Updated due to goal met PT Goal: Perform Home Exercise Program - Progress: Progressing toward goal  Visit Information  Last PT Received On: 02/01/13 Assistance Needed: +1    Subjective Data  Subjective: I feel OK.    Cognition  Cognition Overall Cognitive Status: Appears within functional limits for tasks assessed/performed Arousal/Alertness: Awake/alert Orientation Level: Appears intact for tasks assessed Behavior During Session: [facility] for tasks performed    Balance  Static Sitting Balance Static Sitting - Balance Support: No upper extremity supported Static Sitting - Level of Assistance: 5: Stand by  assistance Static Sitting - Comment/# of Minutes: stood at the sink while performing ADLs x 5 minutes  End of Session PT - End of  Session Equipment Utilized During Treatment: Gait belt Activity Tolerance: Patient tolerated treatment well Patient left: in bed;with call bell/phone within reach Nurse Communication: Mobility status   GP     [facility] HELEN 02/01/2013, 3:32 PM

## 2013-02-01 NOTE — Progress Notes (Addendum)
ANTICOAGULATION CONSULT NOTE - Follow Up Consult  Pharmacy Consult for heparin Indication: pulmonary embolus, DVT and blood clotting disorder  Labs:  Basename 02/01/13 0800 02/01/13 0045 02/01/13 0034 01/31/13 0535 01/30/13 0505  HGB 14.7 14.9 -- -- --  HCT 45.5 45.8 -- 47.2 --  PLT 322 306 -- 309 --  APTT -- -- -- -- --  LABPROT -- -- -- -- --  INR -- -- -- -- --  HEPARINUNFRC 0.57 -- 0.45 0.69 --  CREATININE -- -- 0.79 0.90 0.87  CKTOTAL -- -- -- -- --  CKMB -- -- -- -- --  TROPONINI -- -- -- -- --    Assessment 67yo male with history of PE/DVT and therapeutic on heparin at  1650 units/hr. Hg/Hct= 14.7/45.5 and pltc=322. Patient noted for ICD placement (s/p VF arrest).  Goal of Therapy:  Heparin level 0.3-0.7 units/ml  Monitor platelets by anticoagulation protocol: Yes  Plan -No heparin changes needed -Daily heparin level and CBC -Will follow coumadin plans post ICD  Harland German, Pharm D 02/01/2013 9:51 AM     Addendum: 11:39 AM  Heparin per Rx has been d/c'd and patient is now to restart chronic anticoagulation with warfarin for history of PE/DVT in 2009. Patient's home dose was 3mg  daily. INR SUPRA-therapeutic on admit at 3.68, last INR (1/30) SUB-therapeutic at 1.14 s/p Vit K 5mg  IV on 1/27.   Dr. Graciela Husbands will plan to proceed with ICD implantation tomorrow if WBCs start to trend down, but warfarin will start this evening per discussion with Dr. Johney Frame.  Patient's CBC is stable, hematuria noted 2/3 AM but no persistent blood loss per day RN  Goal of Therapy: - INR 2-3 Monitor platelets by anticoagulation protocol: Yes  Plan: - D/c heparin consult and associated labs - Coumadin 6 mg x 1 tonight - Daily INR, CBC at least q72 hours - Monitor for s/sx bleeding - F/u plans for ICD placement  Thank you for the consult.  Tomi Bamberger, PharmD Clinical Pharmacist Pager: [phone] Pharmacy: [phone] 02/01/2013 11:57 AM

## 2013-02-02 ENCOUNTER — Encounter (HOSPITAL_COMMUNITY): Payer: Self-pay | Admitting: Cardiology

## 2013-02-02 ENCOUNTER — Encounter (HOSPITAL_COMMUNITY)
Admission: EM | Disposition: A | Discharge status: Home Health | Payer: Self-pay | Source: Home / Self Care | Attending: Cardiology

## 2013-02-02 DIAGNOSIS — I2589 Other forms of chronic ischemic heart disease: Secondary | ICD-10-CM

## 2013-02-02 HISTORY — PX: CARDIAC DEFIBRILLATOR PLACEMENT: SHX171

## 2013-02-02 HISTORY — PX: IMPLANTABLE CARDIOVERTER DEFIBRILLATOR IMPLANT: SHX5473

## 2013-02-02 LAB — CBC
HCT: 46.9 % (ref 39.0–52.0)
Hemoglobin: 15.2 g/dL (ref 13.0–17.0)
MCH: 30.4 pg (ref 26.0–34.0)
MCHC: 32.4 g/dL (ref 30.0–36.0)
MCV: 93.8 fL (ref 78.0–100.0)
Platelets: 358 10*3/uL (ref 150–400)
RBC: 5 MIL/uL (ref 4.22–5.81)
RDW: 16.3 % — ABNORMAL HIGH (ref 11.5–15.5)
WBC: 14.6 10*3/uL — ABNORMAL HIGH (ref 4.0–10.5)

## 2013-02-02 LAB — BASIC METABOLIC PANEL
BUN: 17 mg/dL (ref 6–23)
CO2: 31 mEq/L (ref 19–32)
Calcium: 9.5 mg/dL (ref 8.4–10.5)
Chloride: 99 mEq/L (ref 96–112)
Creatinine, Ser: 0.8 mg/dL (ref 0.50–1.35)
GFR calc Af Amer: >90 mL/min (ref >90)
GFR calc non Af Amer: >90 mL/min (ref >90)
Glucose, Bld: 92 mg/dL (ref 70–99)
Potassium: 3.9 mEq/L (ref 3.5–5.1)
Sodium: 138 mEq/L (ref 135–145)

## 2013-02-02 LAB — PROTIME-INR
INR: 1.06 (ref 0.00–1.49)
Prothrombin Time: 13.7 seconds (ref 11.6–15.2)

## 2013-02-02 SURGERY — IMPLANTABLE CARDIOVERTER DEFIBRILLATOR IMPLANT
Anesthesia: LOCAL

## 2013-02-02 MED ORDER — CEFAZOLIN SODIUM-DEXTROSE 2-3 GM-% IV SOLR
2.0000 g | INTRAVENOUS | Status: DC
  Filled 2013-02-02: qty 50

## 2013-02-02 MED ORDER — PNEUMOCOCCAL VAC POLYVALENT 25 MCG/0.5ML IJ INJ
0.5000 mL | INJECTION | INTRAMUSCULAR | Status: AC
  Administered 2013-02-03: 0.5 mL via INTRAMUSCULAR
  Filled 2013-02-02: qty 0.5

## 2013-02-02 MED ORDER — ACETAMINOPHEN 325 MG PO TABS: 325.0000 mg | ORAL_TABLET | ORAL | Status: DC | PRN

## 2013-02-02 MED ORDER — SODIUM CHLORIDE 0.9 % IJ SOLN: 3.0000 mL | Freq: Two times a day (BID) | INTRAMUSCULAR | Status: DC

## 2013-02-02 MED ORDER — MIDAZOLAM HCL 5 MG/5ML IJ SOLN
INTRAMUSCULAR | Status: AC
  Filled 2013-02-02: qty 5

## 2013-02-02 MED ORDER — SODIUM CHLORIDE 0.9 % IJ SOLN: 3.0000 mL | INTRAMUSCULAR | Status: DC | PRN

## 2013-02-02 MED ORDER — LIDOCAINE HCL (PF) 1 % IJ SOLN
INTRAMUSCULAR | Status: AC
  Filled 2013-02-02: qty 60

## 2013-02-02 MED ORDER — WARFARIN SODIUM 6 MG PO TABS
6.0000 mg | ORAL_TABLET | Freq: Once | ORAL | Status: AC
  Administered 2013-02-02: 6 mg via ORAL
  Filled 2013-02-02 (×2): qty 1

## 2013-02-02 MED ORDER — SODIUM CHLORIDE 0.9 % IV SOLN: INTRAVENOUS | Status: AC

## 2013-02-02 MED ORDER — CHLORHEXIDINE GLUCONATE 4 % EX LIQD
60.0000 mL | Freq: Once | CUTANEOUS | Status: DC
  Filled 2013-02-02: qty 60

## 2013-02-02 MED ORDER — FENTANYL CITRATE 0.05 MG/ML IJ SOLN
INTRAMUSCULAR | Status: AC
  Filled 2013-02-02: qty 2

## 2013-02-02 MED ORDER — SODIUM CHLORIDE 0.9 % IR SOLN
80.0000 mg | Status: DC
  Filled 2013-02-02: qty 2

## 2013-02-02 MED ORDER — SODIUM CHLORIDE 0.9 % IV SOLN: 250.0000 mL | INTRAVENOUS | Status: DC

## 2013-02-02 MED ORDER — CEFAZOLIN SODIUM 1-5 GM-% IV SOLN
1.0000 g | Freq: Four times a day (QID) | INTRAVENOUS | Status: AC
  Administered 2013-02-02 – 2013-02-03 (×3): 1 g via INTRAVENOUS
  Filled 2013-02-02 (×3): qty 50

## 2013-02-02 MED ORDER — SODIUM CHLORIDE 0.45 % IV SOLN: INTRAVENOUS | Status: DC

## 2013-02-02 MED ORDER — HEPARIN (PORCINE) IN NACL 2-0.9 UNIT/ML-% IJ SOLN
INTRAMUSCULAR | Status: AC
  Filled 2013-02-02: qty 500

## 2013-02-02 MED ORDER — ONDANSETRON HCL 4 MG/2ML IJ SOLN: 4.0000 mg | Freq: Four times a day (QID) | INTRAMUSCULAR | Status: DC | PRN

## 2013-02-02 NOTE — Interval H&P Note (Signed)
History and Physical Interval Note:  02/02/2013 8:33 AM  Travis Cruz  has presented today for surgery, with the diagnosis of vf  The various methods of treatment have been discussed with the patient and family. After consideration of risks, benefits and other options for treatment, the patient has consented to  Procedure(s) (LRB) with comments: IMPLANTABLE CARDIOVERTER DEFIBRILLATOR IMPLANT (N/A) as a surgical intervention .  The patient's history has been reviewed, patient examined, no change in status, stable for surgery.  I have reviewed the patient's chart and labs.  Questions were answered to the patient's satisfaction.     Travis Cruz  willuse CPAP with history of OSA

## 2013-02-02 NOTE — Clinical Social Work Note (Signed)
CSW noticed transfer to different unit. This CSW is signing off and Unit [address] CSW will continue to follow for discharge planning needs.   Rozetta Nunnery MSW, [employer] [phone]

## 2013-02-02 NOTE — H&P (View-Only) (Signed)
,  skr  Patient Name: Travis Cruz      SUBJECTIVE: without complaint     Past Medical History  Diagnosis Date  . COPD (chronic obstructive pulmonary disease)     Emphysema / Non-oxygen dependent  . Hypertension   . Seizures 2012    following second stroke  . Stroke 2011, 2012  . Pulmonary embolism 2009    in setting of prolonged hospitalization  . DVT (deep venous thrombosis) 2009    in setting of prolonged hospitalization  . OSA (obstructive sleep apnea)     severe, on nocturnal BiPAP  . Patent foramen ovale     Refused repair. On chronic coumadin.    PHYSICAL EXAM Filed Vitals:   02/01/13 2009 02/01/13 2203 02/02/13 0437 02/02/13 0755  BP: 101/66  105/65 104/66  Pulse: 89 88 74 75  Temp: 98.4 F (36.9 C)  97.6 F (36.4 C)   TempSrc: Oral  Oral   Resp: 20 18 18    Height:      Weight:      SpO2: 94% 90% 91%     Well developed and nourished in no acute distress HENT normal Neck supple with JVP-flat Clear Regular rate and rhythm, no murmurs or gallops Abd-soft with active BS No Clubbing cyanosis edema Skin-warm and dry A & Oriented  Grossly normal sensory and motor function  TELEMETRY: Reviewed telemetry pt in *nsr  R PICC line  No cxr    Intake/Output Summary (Last 24 hours) at 02/02/13 0814 Last data filed at 02/02/13 0437  Gross per 24 hour  Intake  756.5 ml  Output   1125 ml  Net -368.5 ml    LABS: Basic Metabolic Panel:  Lab 02/01/13 1610 01/31/13 0535 01/30/13 0505 01/29/13 1510 01/29/13 0500 01/27/13 0545  NA 141 139 139 143 140 142  K 4.0 3.9 3.8 3.9 3.0* 3.8  CL 106 102 101 105 101 106  CO2 29 29 26 29  32 28  GLUCOSE 109* 105* 94 122* 122* 108*  BUN 16 15 9 7 7 9   CREATININE 0.79 0.90 0.87 0.81 0.97 0.89  CALCIUM 9.1 9.5 -- -- -- --  MG -- -- -- -- -- 1.7  PHOS -- -- -- -- -- --   Cardiac Enzymes: No results found for this basename: CKTOTAL:3,CKMB:3,CKMBINDEX:3,TROPONINI:3 in the last 72 hours CBC:  Lab 02/01/13 0800  02/01/13 0045 01/31/13 0535 01/30/13 0505 01/29/13 0500 01/28/13 0530 01/27/13 0545  WBC 18.3* 17.5* 19.5* 15.2* 13.8* 14.6* 16.6*  NEUTROABS -- -- -- -- -- -- --  HGB 14.7 14.9 15.9 16.1 14.2 13.9 14.5  HCT 45.5 45.8 47.2 48.3 42.4 42.4 45.9  MCV 92.7 92.0 91.7 91.5 92.0 92.2 92.0  PLT 322 306 309 263 232 211 199   PROTIME: No results found for this basename: LABPROT:3,INR:3 in the last 72 hours Liver Function Tests: No results found for this basename: AST:2,ALT:2,ALKPHOS:2,BILITOT:2,PROT:2,ALBUMIN:2 in the last 72 hours No results found for this basename: LIPASE:2,AMYLASE:2 in the last 72 hours BNP: BNP (last 3 results)   ASSESSMENT AND PLAN:  Patient Active Hospital Problem List: Cardiac arrest (01/22/2013)   COPD (chronic obstructive pulmonary disease) (03/24/2011)  NSTEMI (non-ST elevated myocardial infarction) (01/27/2013)   Ischemic cardiomyopathy  Leukocytosis  The pts wife says chronic leukocytosis 2/2 splenectomy  No fever, no evidence of central or superficial infection  Will proceed with ICD -single chamber implantation Signed, [name] MD  02/02/2013

## 2013-02-02 NOTE — Progress Notes (Signed)
ANTICOAGULATION CONSULT NOTE - Follow Up Consult  Pharmacy Consult for Coumadin Indication: pulmonary embolus, DVT and blood clotting disorder  Labs:  Basename 02/02/13 0835 02/01/13 0800 02/01/13 0045 02/01/13 0034 01/31/13 0535  HGB 15.2 14.7 -- -- --  HCT 46.9 45.5 45.8 -- --  PLT 358 322 306 -- --  APTT -- -- -- -- --  LABPROT 13.7 -- -- -- --  INR 1.06 -- -- -- --  HEPARINUNFRC -- 0.57 -- 0.45 0.69  CREATININE 0.80 -- -- 0.79 0.90  CKTOTAL -- -- -- -- --  CKMB -- -- -- -- --  TROPONINI -- -- -- -- --    Assessment 67yo male with history of PE/DVT, Coumadin restarted yesterday, ICD/pacer placement today  Goal of Therapy:  INR = 2 to 3 Monitor platelets by anticoagulation protocol: Yes  Plan 1) Coumadin 6 mg po x 1 dose today 2) Daily INR  Thank you. Okey Regal, PharmD [phone]

## 2013-02-02 NOTE — Op Note (Signed)
NAMEXAVIAR, LUNN NO.:  [id number]  MEDICAL RECORD NO.:  [id number]  LOCATION:  2005                         FACILITY:  [facility]  PHYSICIAN:  Duke Salvia, MD, FACCDATE OF BIRTH:  05/28/1946  DATE OF PROCEDURE:  02/02/2013 DATE OF DISCHARGE:                              OPERATIVE REPORT   PREOPERATIVE DIAGNOSIS:  Aborted cardiac arrest; ischemic cardiomyopathy.  POSTOPERATIVE DIAGNOSIS:  Aborted cardiac arrest; ischemic cardiomyopathy.  PROCEDURE:  Single-chamber defibrillator implantation with high-voltage testing; noninvasive electrophysiological study.  Following obtaining informed consent, the patient was brought to the electrophysiology laboratory and placed on the fluoroscopic table in supine position.  After routine prep and drape of the left upper chest, lidocaine was infiltrated in prepectoral subclavicular region.  An incision was made and carried down to the layer of the prepectoral fascia using electrocautery and sharp dissection.  A pocket was formed similarly.  Hemostasis was obtained.  Thereafter, attention was turned to gain access to the extrathoracic left subclavian vein, which was accomplished with mild difficulty but without the aspiration of air or puncture of the artery.  A single venipuncture was accomplished.  A 9-French sheath was placed, and through this, we passed a [employer] single-coil defibrillator lead, serial [id number].  Under fluoroscopic guidance, it was made to the right ventricular apex where the bipolar R-wave was 8.9 with a pace impedance of 814 and threshold of 1.1 V at 0.5 milliseconds. Current threshold was about 1.3 mA.  There was no diaphragmatic pacing at 10 V.  The current of injury was modest.  The lead was secured to the prepectoral fascia and then attached now to a [facility] [id number], serial [id number].  Through the device, the bipolar R-wave was 10.5 with a pace impedance of  730, a threshold of 0.5 at 0.5.  High-voltage impedance was 84 ohms.  At this juncture, noninvasive electrophysiological testing was undertaken.  Programmed stimulation was delivered from the right ventricular apex of operative 2 V at 0.5 milliseconds with close coupling intervals of 500:240:200:200.  Nonsustained polymorphic ventricular tachycardia was induced, but no monomorphic ventricular tachycardia was induced.  The pocket was copiously irrigated with antibiotic-containing saline solution.  Hemostasis was assured.  Leads and pulse generator were placed in the pocket, secured to the prepectoral fascia.  The wound was then closed in 2 layers in normal fashion.  The wound was washed, dried, and a benzoin Steri-Strip dressing was applied.  Needle counts, sponge counts, and instrument counts were correct at the end of the procedure according to the staff. At this point, I scrubbed out.  The patient's blood pressure was mildly low.  We gave the patient a fluid bolus.  The patient, on the floor, blood pressures have been in the range of 91-105 and periprocedurally, they had also been in the 90 mm range.  Ventricular fibrillation was induced via T-wave shock.  After a total duration of 7 seconds, a 15-joule shock to an impedance of 84 ohms terminating ventricular fibrillation restoring sinus rhythm.  The patient tolerated the procedure with only transient hypotension.     Duke Salvia, MD, [facility]     SCK/MEDQ  D:  02/02/2013  T:  02/02/2013  Job:  [id number]

## 2013-02-02 NOTE — CV Procedure (Signed)
Travis Cruz [id number]  [id number]  Preop Dx: ccaridac arrest , ischemic cardiomyopathy Postop Dx same/   Procedure: ICD implantation, noninvasive EPS  Cx: None   Dictation number [id number]  [name], MD 02/02/2013 10:16 AM

## 2013-02-02 NOTE — Progress Notes (Signed)
,  skr  Patient Name: Travis Cruz      SUBJECTIVE: without complaint     Past Medical History  Diagnosis Date  . COPD (chronic obstructive pulmonary disease)     Emphysema / Non-oxygen dependent  . Hypertension   . Seizures 2012    following second stroke  . Stroke 2011, 2012  . Pulmonary embolism 2009    in setting of prolonged hospitalization  . DVT (deep venous thrombosis) 2009    in setting of prolonged hospitalization  . OSA (obstructive sleep apnea)     severe, on nocturnal BiPAP  . Patent foramen ovale     Refused repair. On chronic coumadin.    PHYSICAL EXAM Filed Vitals:   02/01/13 2009 02/01/13 2203 02/02/13 0437 02/02/13 0755  BP: 101/66  105/65 104/66  Pulse: 89 88 74 75  Temp: 98.4 F (36.9 C)  97.6 F (36.4 C)   TempSrc: Oral  Oral   Resp: 20 18 18   Height:      Weight:      SpO2: 94% 90% 91%     Well developed and nourished in no acute distress HENT normal Neck supple with JVP-flat Clear Regular rate and rhythm, no murmurs or gallops Abd-soft with active BS No Clubbing cyanosis edema Skin-warm and dry A & Oriented  Grossly normal sensory and motor function  TELEMETRY: Reviewed telemetry pt in *nsr  R PICC line  No cxr    Intake/Output Summary (Last 24 hours) at 02/02/13 0814 Last data filed at 02/02/13 0437  Gross per 24 hour  Intake  756.5 ml  Output   1125 ml  Net -368.5 ml    LABS: Basic Metabolic Panel:  Lab 02/01/13 0034 01/31/13 0535 01/30/13 0505 01/29/13 1510 01/29/13 0500 01/27/13 0545  NA 141 139 139 143 140 142  K 4.0 3.9 3.8 3.9 3.0* 3.8  CL 106 102 101 105 101 106  CO2 29 29 26 29 32 28  GLUCOSE 109* 105* 94 122* 122* 108*  BUN 16 15 9 7 7 9  CREATININE 0.79 0.90 0.87 0.81 0.97 0.89  CALCIUM 9.1 9.5 -- -- -- --  MG -- -- -- -- -- 1.7  PHOS -- -- -- -- -- --   Cardiac Enzymes: No results found for this basename: CKTOTAL:3,CKMB:3,CKMBINDEX:3,TROPONINI:3 in the last 72 hours CBC:  Lab 02/01/13 0800  02/01/13 0045 01/31/13 0535 01/30/13 0505 01/29/13 0500 01/28/13 0530 01/27/13 0545  WBC 18.3* 17.5* 19.5* 15.2* 13.8* 14.6* 16.6*  NEUTROABS -- -- -- -- -- -- --  HGB 14.7 14.9 15.9 16.1 14.2 13.9 14.5  HCT 45.5 45.8 47.2 48.3 42.4 42.4 45.9  MCV 92.7 92.0 91.7 91.5 92.0 92.2 92.0  PLT 322 306 309 263 232 211 199   PROTIME: No results found for this basename: LABPROT:3,INR:3 in the last 72 hours Liver Function Tests: No results found for this basename: AST:2,ALT:2,ALKPHOS:2,BILITOT:2,PROT:2,ALBUMIN:2 in the last 72 hours No results found for this basename: LIPASE:2,AMYLASE:2 in the last 72 hours BNP: BNP (last 3 results)   ASSESSMENT AND PLAN:  Patient Active Hospital Problem List: Cardiac arrest (01/22/2013)   COPD (chronic obstructive pulmonary disease) (03/24/2011)  NSTEMI (non-ST elevated myocardial infarction) (01/27/2013)   Ischemic cardiomyopathy  Leukocytosis  The pts wife says chronic leukocytosis 2/2 splenectomy  No fever, no evidence of central or superficial infection  Will proceed with ICD -single chamber implantation Signed, Steven Klein MD  02/02/2013   

## 2013-02-02 NOTE — Progress Notes (Signed)
OT Cancellation Note  Patient Details Name: Travis Cruz MRN: [id number] DOB: 20-Jan-1946   Cancelled Treatment:    Reason Eval/Treat Not Completed: Medical issues which prohibited therapy;Other (comment) (s/p ICD. will see tomorrow)  [facility] Roshana Shuffield, OTR/L  [phone] 02/02/2013 02/02/2013, 6:12 PM

## 2013-02-03 ENCOUNTER — Inpatient Hospital Stay (HOSPITAL_COMMUNITY): Payer: BC Managed Care – PPO

## 2013-02-03 ENCOUNTER — Encounter (HOSPITAL_COMMUNITY): Payer: Self-pay | Admitting: Cardiology

## 2013-02-03 LAB — CBC
HCT: 46.1 % (ref 39.0–52.0)
Hemoglobin: 15 g/dL (ref 13.0–17.0)
MCH: 30.5 pg (ref 26.0–34.0)
MCHC: 32.5 g/dL (ref 30.0–36.0)
MCV: 93.9 fL (ref 78.0–100.0)
Platelets: 372 10*3/uL (ref 150–400)
RBC: 4.91 MIL/uL (ref 4.22–5.81)
RDW: 16.3 % — ABNORMAL HIGH (ref 11.5–15.5)
WBC: 15.6 10*3/uL — ABNORMAL HIGH (ref 4.0–10.5)

## 2013-02-03 LAB — PROTIME-INR
INR: 1.48 (ref 0.00–1.49)
Prothrombin Time: 17.5 seconds — ABNORMAL HIGH (ref 11.6–15.2)

## 2013-02-03 MED ORDER — WARFARIN SODIUM 6 MG PO TABS
6.0000 mg | ORAL_TABLET | Freq: Once | ORAL | Status: AC
  Administered 2013-02-03: 6 mg via ORAL
  Filled 2013-02-03: qty 1

## 2013-02-03 NOTE — Progress Notes (Signed)
ANTICOAGULATION CONSULT NOTE - Follow Up Consult  Pharmacy Consult for Coumadin Indication: pulmonary embolus, DVT and blood clotting disorder  Labs:  Basename 02/03/13 0502 02/02/13 0835 02/01/13 0800 02/01/13 0034  HGB 15.0 15.2 -- --  HCT 46.1 46.9 45.5 --  PLT 372 358 322 --  APTT -- -- -- --  LABPROT 17.5* 13.7 -- --  INR 1.48 1.06 -- --  HEPARINUNFRC -- -- 0.57 0.45  CREATININE -- 0.80 -- 0.79  CKTOTAL -- -- -- --  CKMB -- -- -- --  TROPONINI -- -- -- --    Assessment 67yo male with history of PE/DVT, Coumadin restarted 2/4, s/p ICD placement with plans for discharge home today  INR = 1.48  Goal of Therapy:  INR = 2 to 3 Monitor platelets by anticoagulation protocol: Yes  Plan 1) Coumadin 6 mg po x 1 dose today prior to discharge 2) Daily INR 3) Recommend 6 mg dose on Friday, then 3 mg over the weekend with follow up on Monday to establish dose.  Thank you. Okey Regal, PharmD [phone]

## 2013-02-03 NOTE — Progress Notes (Signed)
Pt was placed on CPAP at 22:20 of 8 cmH2O with a large full face mask and 4 LPM bled in. Vitals are stable HR 82 RR 18 oxygen saturation 90%. Pt is comfortable and RT will continue to monitor.

## 2013-02-03 NOTE — Progress Notes (Signed)
SATURATION QUALIFICATIONS: (This note is used to comply with regulatory documentation for home oxygen)  Patient Saturations on Room Air at Rest = 85%  Patient Saturations on Room Air while Ambulating = 81%  Patient Saturations on 4 Liters of oxygen while Ambulating = 88%-92%  Please briefly explain why patient needs home oxygen:Pt desats to mid 80's on RA at rest and desats to low 80's with on RA with activity.  Patient sats 88-92% with 4L O2 with activity, therefore needs O2 at home to keep sats at an appropriate level.  Thanks.  [facility] [phone] (pager)

## 2013-02-03 NOTE — Progress Notes (Signed)
CIR Admissions: Have faxed clinicals to pt's insurance co. to request authorization for CIR. Noted pt has progressed to min guard to ambulate 180' which may be too high level for insurance to approve. Noted CSW is working with family on SNF placement as a back-up plan.  For questions call [phone]

## 2013-02-03 NOTE — Discharge Summary (Signed)
Discharge Summary   Patient ID: Travis Cruz MRN: [id number], DOB/AGE: 65-01-1946 65 y.o.  Primary MD: Horald Pollen., PA Primary Cardiologist: Dr. Antoine Poche Primary Electrophysiologist: Dr. Graciela Husbands Admit date: 01/22/2013 D/C date:     02/04/2013      Primary Discharge Diagnoses:  1. VF Cardiac Arrest  - Unknown etiology  - S/p St. Jude single chamber ICD 02/02/13  2. Acute Systolic CHF/Ischemic CM, EF 35-40%   3. Coronary Artery Disease  - Cath 01/31/13 Severe single vessel CAD of RCA with appearance of old inferior MI, otherwise nonobstructive dz, treated medically  - Initiated on statin, will need f/u LFTs and lipid panel in 6 weeks  4. Acute Respiratory Failure/ARDS/Aspiration Pneumonitis   - Treated with abx (7 days of Unasyn) and diuresis  - Discharged with home O2  5. Deconditioning  - Discharged w/ home health PT/OT/RN/Aide  6. Rib Fractures  - 2/2 CPR  7. Abnormal Chest CT  - 1.5 cm nodule noted within the left upper lobe; Rec follow up Chest CT after resolution of acute process   8. Cholelithiasis   - Incidentally found on CTA chest  9. H/o DVT/PE, PFO and CVA on chronic Coumadin   Secondary Discharge Diagnoses:  . COPD (chronic obstructive pulmonary disease)     Emphysema / Non-oxygen dependent  . Hypertension   . Seizures 2012    following second stroke  . Stroke 2011, 2012  . Pulmonary embolism 2009    in setting of prolonged hospitalization; chronic coumadin  . DVT (deep venous thrombosis) 2009    in setting of prolonged hospitalization; ; chronic coumadin  . OSA (obstructive sleep apnea)     severe, on nocturnal BiPAP  . Patent foramen ovale     refused repair; on chronic coumadin     Allergies Allergies  Allergen Reactions  . Ativan (Lorazepam) Anxiety    [name]    Diagnostic Studies/Procedures:   02/02/13 - ICD Implantation w/ EPS (See Op Note) [employer] [phone] single-coil  defibrillator lead, serial [phone] in the [employer] ICD Model [phone], serial #[id number]  01/31/13 - Cardiac Cath Hemodynamics:  AO 112/71  LV 116/11  Coronary angiography:  Coronary dominance: Right  Left mainstem: Normal  Left anterior descending (LAD): Large vessel that wraps the apex. Fixed 40% stenosis at D1. Moderate diffuse apical nonobstructive CAD  Left circumflex (LCx): Large vessel in the AV groove with mild luminal irregularities. MOM very small with subtotal stenosis.  Ramus intermedius (RI): Large. Ostial 25%.  Right coronary artery (RCA): Dominant. Long proximal 95% stenosis followed by an aneurysmal dilatation and then mid occlusion. Left to right moderate collaterals.  Left ventriculography: Left ventricular systolic with inferior akinesis, LVEF is estimated at 55%, there is no significant mitral regurgitation  Final Conclusions: Severe single vessel CAD. 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 Otherwise small vessel disease and nonobstructive large vessel disease.  Recommendations: Medical management and risk reduction. I will discuss ICD placement with EP  01/23/13 - Echocardiogram  - Left ventricle: Difficult windows. The cavity size was normal. Wall thickness was increased in a pattern of mild LVH. Systolic function was moderately reduced. The estimated ejection fraction was in the range of 35% to 40%. Regional wall motion abnormalities: Possible hypokinesis of the anteroseptal myocardium. - Aortic valve: Mildly thickened, mildly calcified leaflets. Cusp separation was normal. Doppler: Transvalvular velocity was within the normal range. There was no  stenosis. No regurgitation. - Aorta: The aorta was normal, not dilated, and non-diseased. - Mitral valve: Structurally normal valve. Leaflet separation was normal. Doppler: Transvalvular velocity was within the normal range. There was no evidence for stenosis. No regurgitation. - Left atrium: The atrium  was normal in size. - Right ventricle: The cavity size was mildly dilated. Wall thickness was normal. Systolic function was moderately reduced. - Pulmonic valve: Poorly visualized. Structurally normal valve. Cusp separation was normal. Doppler: Transvalvular velocity was within the normal range. No regurgitation. - Tricuspid valve: Poorly visualized. Structurally normal valve. Leaflet separation was normal. Doppler: Transvalvular velocity was within the normal range. No regurgitation. - Pulmonary artery: Poorly visualized. - Right atrium: The atrium was mildly dilated. - Pericardium: The pericardium was normal in appearance. There was no pericardial effusion. - Systemic veins: Not visualized.  01/23/2013 - CT ANGIOGRAPHY CHEST Findings: There is no evidence of pulmonary embolus.  Evaluation for pulmonary embolus is mildly suboptimal in areas of airspace consolidation.  There is dense dependent airspace opacification within the upper lung lobes, much more prominent on the right.  This could reflect multifocal pneumonia or possibly aspiration.  Underlying ground- glass opacities are noted at the lung apices.  The appearance is atypical for edema.  There is a 1.5 cm nodule within the left upper lobe; though this may reflect transient airspace opacity, malignancy cannot be excluded.  Patchy bilateral lower lobe airspace opacities likely reflect atelectasis.  No pleural effusion or pneumothorax is seen. Underlying mild emphysema is noted, particularly at the upper lung lobes.  Scattered coronary artery calcifications are seen.  No definite mediastinal lymphadenopathy is seen.  No pericardial effusion is identified.  The patient's endotracheal tube is seen ending approximate 3 cm above the carina.  The patient's enteric tube is noted ending just distal to the gastroesophageal junction.  No axillary lymphadenopathy is seen.  The thyroid gland is unremarkable in appearance.  The visualized portions of the liver are  unremarkable.  The stomach is filled with air and fluid.  Nonspecific perinephric stranding noted bilaterally.  Small stones are noted dependently within the gallbladder; the gallbladder is otherwise unremarkable in appearance.  There are minimally displaced fractures of the right second, fourth and sixth ribs; fractures of the right third and fifth ribs are suspected but not well seen.  There are also minimally displaced fractures of the left anterior and lateral second through eighth ribs.  A butterfly vertebra is noted at vertebral body T10, and there is mild chronic loss of endplate height at the superior endplate of T12.  The inferior body of the sternum is not well characterized due to motion artifact; however, a mildly displaced fracture is suspected, given an underlying small amount of soft tissue hematoma tracking anterior to the pericardium.  IMPRESSION:  1.  No evidence of pulmonary embolus. 2.  Dense dependent airspace consolidation within the upper lung lobes, much more prominent on the right.  This could reflect multifocal pneumonia or possibly aspiration.  Underlying ground- glass opacities at the lung apices.  The appearance is atypical for edema. 3.  Patchy bilateral lower lobe airspace opacities likely reflect atelectasis. 4.  Minimally displaced fractures of the right second, fourth and sixth ribs; suspect fractures of the right third and fifth ribs. Minimally displaced fractures of the left anterior and lateral 2nd through 8th ribs. 5.  Suspect mildly displaced fracture involving the inferior body of the sternum, given an underlying small amount of soft tissue hematoma tracking anterior to the pericardium.  6.  Scattered coronary artery calcifications seen. 7.  Mild underlying emphysema noted, particularly at the upper lung lobes. 8.  1.5 cm nodule noted within the left upper lobe.  Though this may reflect transient airspace opacity, malignancy cannot be excluded.  Would perform follow-up CT of  the chest after complete resolution of the acute process. 9.  Enteric tube noted ending just distal to the gastroesophageal junction; this could be advanced, as deemed clinically appropriate. 10.  Cholelithiasis; gallbladder otherwise unremarkable.   01/23/2013 - CT HEAD   Findings: No intracranial hemorrhage.  Dural sinuses appear dense however, this was noted previously and may be normal for this patient.  Remote bilateral parietal - occipital lobe infarcts with encephalomalacia larger on the left.  Findings are new from the prior exam.  New from prior examination are small infarcts involving the left thalamus, right globus pallidus and left cerebellum probably remote although acute/subacute infarct not entirely excluded.  Global atrophy without hydrocephalus.  No intracranial mass lesion detected on this unenhanced exam.  Opacification ethmoid sinus air cells with maxillary sinus mucosal thickening.  Dental disease.  IMPRESSION: No intracranial hemorrhage.  Bilateral infarcts as described above most of which appear remote.    01/23/2013 - CT CERVICAL SPINE   Findings: No cervical spine fracture.  Cervical spondylotic changes with various degrees spinal stenosis and foraminal narrowing.  Facet joint degenerative changes greater on the left at the C3-4 and C4-5 level.  Minimal anterior slip of C3 and C4.  Broad-based osteophyte greater to the right at the C5-6 level.  The patient is intubated with nasogastric tube in place.  No obvious abnormal prevertebral soft tissue swelling.  Markedly abnormal appearance of patient's lungs.  Please see chest CT report performed the same date.  IMPRESSION: No cervical spine fracture.  Please see above.      History of Present Illness: 67 y.o. male w/ the above medical problems who was admitted to [facility] on 01/22/13 after a witnessed arrest at home. He was reportedly in the bathroom, his family heard a loud thud and they found him unresponsive on the floor. CPR  was initiated and EMS activated. Upon EMS arrival, he was still unresponsive. His rhythm was VF and he was defibrillated x 4. He had brief ROSC with each shock but only sustained pulse after final shock. He also received epinephrine x 2 and IV amiodarone 450 mg total. He was intubated en route to ED.   Hospital Course: He was admitted to the CCU and placed on cooling protocol. On admission, K 4.7 and Mg 2.1. Head CT was without acute findings, but did note remote bilateral parietal-occipital lobe infarcts. EEG was without evidence of seizure. His 12-lead ECG did not show acute ST changes. Chest CT was negative for PE and aortic dissection (mention of LUL nodule 1.5 cm - ? airspace opacity vs malignancy). He was treated with 7 days of Unasyn for suspected aspiration pneumonitis. An echo was done which was limited due to poor windows but revealed normal LV size with moderately reduced LV function, EF 35-40%. He was rewarmed on 01/24/2013 and extubated on 01/26/2013. He was initiated on appropriate medical therapy including carvedilol and lisinopril. He was diuresed and underwent cardiac catheterization on 01/31/13 which revealed single vessel CAD with occluded RCA which appeared chronic with collateralization from left system. Otherwise there was nonobstructive and small vessel disease which will be managed medically.   The etiology of his V.Fib arrest was unexplained and there were no  reversible causes identified. Dr. Graciela Husbands evaluated him in consultation and recommended ICD implantation for secondary prevention of SCD. He underwent placement of St. Jude single-chamber ICD on 02/02/13. Noninvasive EPS was performed without inducible VT. Dr. Graciela Husbands suspected that his ICM/MI was not responsible for his cardiac arrest and will look for evidence of primary co-occuring electrical disease to help explain his event. The patient was instructed not to drive for at least 6 months. He continued to have decreased oxygen saturations  with ambulation and will therefore be discharged with home oxygen. He was evaluated by PT/OT who in conjunction with his medical team recommended home health PT/OT/RN/Aide at discharge which was arranged by case management.  He was seen and evaluated by Dr. Antoine Poche who felt he was stable for discharge home with plans for follow up as scheduled below.  Discharge Vitals: Blood pressure 94/62, pulse 89, temperature 97.7 F (36.5 C), temperature source Oral, resp. rate 18, height 5\' 10"  (1.778 m), weight 201 lb 11.5 oz (91.5 kg), SpO2 93.00%.  Labs: Component Value Date   WBC 17.0* 02/04/2013   HGB 15.3 02/04/2013   HCT 47.1 02/04/2013   MCV 94.4 02/04/2013   PLT 417* 02/04/2013    Lab 02/02/13 0835  NA 138  K 3.9  CL 99  CO2 31  BUN 17  CREATININE 0.80  CALCIUM 9.5  GLUCOSE 92     01/23/2013 09:00  Hemoglobin A1C 5.8 (H)     01/23/2013 09:00  TSH 1.154    Discharge Medications     Medication List     As of 02/04/2013 11:58 AM    TAKE these medications         aspirin 81 MG chewable tablet   Chew 1 tablet (81 mg total) by mouth daily.      atorvastatin 80 MG tablet   Commonly known as: LIPITOR   Take 1 tablet (80 mg total) by mouth at bedtime.      carvedilol 6.25 MG tablet   Commonly known as: COREG   Take 1 tablet (6.25 mg total) by mouth 2 (two) times daily with a meal.      furosemide 40 MG tablet   Commonly known as: LASIX   Take 1 tablet (40 mg total) by mouth daily.      levETIRAcetam 500 MG tablet   Commonly known as: KEPPRA   Take 500 mg by mouth 2 (two) times daily.      lisinopril 5 MG tablet   Commonly known as: PRINIVIL,ZESTRIL   Take 1 tablet (5 mg total) by mouth 2 (two) times daily.      spironolactone 12.5 mg Tabs   Commonly known as: ALDACTONE   Take 0.5 tablets (12.5 mg total) by mouth daily.      tiotropium 18 MCG inhalation capsule   Commonly known as: SPIRIVA   Place 18 mcg into inhaler and inhale daily.      warfarin 2 MG tablet    Commonly known as: COUMADIN   Take 1 tablet (2 mg total) by mouth as directed. Take 2mg  daily until you have your INR checked on 2/10 for further instructions          Disposition   Discharge Orders    Future Appointments: Provider: Department: Dept Phone: Center:   02/10/2013 11:30 AM [name], NP [facility] Main Office [place]) [phone] [facility]   02/10/2013 12:00 PM [facility] [employer] [employer] [facility] Main Office [place]) [phone] [facility]   02/10/2013 12:30 PM [facility] [employer] [employer] [facility] Main Office [place]) [phone] [facility]   03/02/2013 11:15 AM Rollene Rotunda, MD [facility] at [place] [phone] [facility]   03/15/2013 10:30 AM Duke Salvia, MD [facility] Main Office [place]) [phone] [facility]     Future Orders Please Complete By Expires   Diet - low sodium heart healthy      Discharge instructions      Comments:   * KEEP GROIN SITE CLEAN AND DRY. Call the office for any signs of bleedings, pus, swelling, increased pain, or any other concerns. * NO HEAVY LIFTING (>10lbs) X 2 WEEKS. * NO SEXUAL ACTIVITY X 2 WEEKS. * NO SOAKING BATHS, HOT TUBS, POOLS, ETC., X 4 DAYS.   Increase activity slowly      Discharge instructions      Comments:   * Please take all medications as prescribed and bring with you to your office visits  * Please have your INR checked on Monday 02/07/13 and follow their recommendations on coumadin dosing     Follow-up Information    Follow up with [facility]. On 02/10/2013. (At 12:00 noon for wound check)    Contact information:   [facility] [address] [place] [address] [phone]      Follow up with [name], MD. On 03/15/2013. (At 10:30 AM for EP/ICD follow-up)    Contact information:   [facility] [address] [place] [address] [phone]      Follow up with Rollene Rotunda, MD. On 03/02/2013. (At 11:15 AM for  hospital follow-up)    Contact information:   [facility] [address] [place], [place] [address] [phone]      Follow up with Nicolasa Ducking, NP. On 02/10/2013. (11:30)    Contact information:   [facility] [address] [place] [address] [phone]           Outstanding Labs/Studies:  BMET INR Repeat Chest CT Lipid panel and LFTs in 6 wks  Duration of Discharge Encounter: Greater than 30 minutes including physician and PA time.  Signed, Hilarie Sinha PA-C 02/04/2013, 11:58 AM

## 2013-02-03 NOTE — Progress Notes (Signed)
Occupational Therapy Treatment Patient Details Name: Travis Cruz MRN: [id number] DOB: 11-01-1946 Today's Date: 02/03/2013 Time: 4098-1191 OT Time Calculation (min): 27 min  OT Assessment / Plan / Recommendation Comments on Treatment Session Pt considering d/c home tomorrow.  Discussed in depth with pt and wife, and feel that pt would be safe for return home.  Wife works during day but says she can come home at lunch to check in.  Pt's sister also reporting she can check on pt during day (although cannot physically assist).  Pt is demonstrating good activity tolerance and safety during functional mobility.  Will recommend HHOT services.    Follow Up Recommendations  Home health OT    Barriers to Discharge       Equipment Recommendations  3 in 1 bedside comode    Recommendations for Other Services    Frequency Min 2X/week   Plan Discharge plan needs to be updated    Precautions / Restrictions Precautions Precautions: Fall   Pertinent Vitals/Pain See vitals    ADL  Upper Body Dressing: Performed;Set up Where Assessed - Upper Body Dressing: Unsupported sitting Toilet Transfer: Simulated;Min guard Toilet Transfer Method: Sit to Barista:  (bed) Equipment Used: Gait belt;Rolling walker Transfers/Ambulation Related to ADLs: min guard with RW in hall ADL Comments: Pt considering d/c home vs SNF tomorrow.  Discussed with both pt and wife home safety techniques.  Educated on picking up throw rugs and any tripping hazards at home. Pt has 2 small dogs but wife reports she will take care of feeding (which requires bending over to place bowl on floor).  Pt has tub bench which he does not typically use, but wife reports she can get it out if needed. Recommended use of tub bench to assist in conserving energy while showering.  Pt demonstrated good safety with RW throughout session. Politely declining performing other ADLs (grooming, toileting, etc.)    OT Diagnosis:     OT Problem List:   OT Treatment Interventions:     OT Goals ADL Goals Pt Will Perform Upper Body Dressing: with supervision;Sitting, chair;Sitting, bed ADL Goal: Upper Body Dressing - Progress: Met Pt Will Transfer to Toilet: with supervision;Ambulation;with DME;Comfort height toilet ADL Goal: Toilet Transfer - Progress: Progressing toward goals Miscellaneous OT Goals Miscellaneous OT Goal #1: Pt will perform all functional mobility at supervision level. OT Goal: Miscellaneous Goal #1 - Progress: Progressing toward goals Miscellaneous OT Goal #2: Pt will perform bed mobility at supervision level as precursor for EOB ADLs. OT Goal: Miscellaneous Goal #2 - Progress: Met  Visit Information  Last OT Received On: 02/03/13 Assistance Needed: +1    Subjective Data      Prior Functioning       Cognition  Cognition Overall Cognitive Status: Appears within functional limits for tasks assessed/performed Arousal/Alertness: Awake/alert Orientation Level: Appears intact for tasks assessed Behavior During Session: [facility] for tasks performed Current Attention Level: Sustained Following Commands: Follows one step commands consistently    Mobility  Bed Mobility Bed Mobility: Supine to Sit;Sitting - Scoot to Edge of Bed;Sit to Supine Supine to Sit: 5: Supervision;HOB flat Sitting - Scoot to Edge of Bed: 5: Supervision Sit to Supine: 5: Supervision;HOB flat Transfers Transfers: Stand to Sit;Sit to Stand Sit to Stand: 5: Supervision;From bed;With upper extremity assist Stand to Sit: 5: Supervision;To bed Details for Transfer Assistance: good technique    Exercises      Balance     End of Session OT - End of  Session Equipment Utilized During Treatment: Gait belt Activity Tolerance: Patient tolerated treatment well Patient left: in bed;with call bell/phone within reach;with family/visitor present Nurse Communication: Mobility status  GO   02/03/2013 Cipriano Mile  OTR/L Pager [phone] Office [phone]   Cipriano Mile 02/03/2013, 4:56 PM

## 2013-02-03 NOTE — Progress Notes (Signed)
   ELECTROPHYSIOLOGY ROUNDING NOTE    Patient Name: Travis Cruz Date of Encounter: 02-03-13    SUBJECTIVE:Patient feels well.  No chest pain or shortness of breath.  Minimal incisional soreness.  S/p single chamber ICD implant 02-02-2013 for aborted cardiac arrest.  TELEMETRY: Reviewed telemetry pt in sinus rhythm Filed Vitals:   02/02/13 1930 02/02/13 2008 02/02/13 2220 02/03/13 0516  BP: 112/68 102/67  93/62  Pulse: 76 79 82 74  Temp:  98.1 F (36.7 C)  97.6 F (36.4 C)  TempSrc:  Oral  Oral  Resp:  18 18 18   Height:      Weight:      SpO2:  92% 90% 93%    Intake/Output Summary (Last 24 hours) at 02/03/13 0659 Last data filed at 02/03/13 0600  Gross per 24 hour  Intake    390 ml  Output   2355 ml  Net  -1965 ml    LABS: Basic Metabolic Panel:  Basename 02/02/13 0835 02/01/13 0034  NA 138 141  K 3.9 4.0  CL 99 106  CO2 31 29  GLUCOSE 92 109*  BUN 17 16  CREATININE 0.80 0.79  CALCIUM 9.5 9.1  MG -- --  PHOS -- --   CBC:  Basename 02/03/13 0502 02/02/13 0835  WBC 15.6* 14.6*  NEUTROABS -- --  HGB 15.0 15.2  HCT 46.1 46.9  MCV 93.9 93.8  PLT 372 358   INR: 1.48  Radiology/Studies:  Final result pending, leads in stable position.  PHYSICAL EXAM Left chest without hematoma/ecchymosis Well developed and nourished in no acute distress HENT normal Neck supple with JVP-flat Carotids brisk and full without bruits Clear Regular rate and rhythm, no murmurs or gallops Abd-soft with active BS without hepatomegaly No Clubbing cyanosis edema Skin-warm and dry A & Oriented  Grossly normal sensory and motor function   DEVICE INTERROGATION: Device interrogation pending  Wound care, arm mobility, shock plan, restrictions reviewed with patient. Routine device follow up scheduled.   Plan discharge today followup sk 6 weeks  Non inducible at EPS yesterday raises the spectre of true true and unrelated, ie that the ICM/MI is NOT responsible for his  arrest.  Will look for evidence of primary co-occuring electrical disease On Guideline directed medical therapy

## 2013-02-03 NOTE — Progress Notes (Signed)
Placed pt. On cpap. Pt. Is tolerating well at this time. RT will inform RN that pt. Is on cpap. RT to monitor.

## 2013-02-03 NOTE — Care Management Note (Signed)
    Page 1 of 2   02/04/2013     2:55:32 PM   CARE MANAGEMENT NOTE 02/04/2013  Patient:  Travis Cruz, Travis Cruz   Account Number:  [id number]  Date Initiated:  01/26/2013  Documentation initiated by:  Junius Creamer  Subjective/Objective Assessment:   adm w cardiac arrest     Action/Plan:   lives w wife, pcp dr a meirer   Anticipated DC Date:  02/04/2013   Anticipated DC Plan:  HOME W HOME HEALTH SERVICES  In-house referral  Clinical Social Worker      DC Associate Professor  CM consult      [facility] Choice  HOME HEALTH   Choice offered to / List presented to:  C-1 Patient        HH arranged  HH-1 RN  HH-2 PT  HH-3 OT  HH-4 NURSE'S AIDE      HH agency  [facility]   Status of service:  Completed, signed off Medicare Important Message given?   (If response is "NO", the following Medicare IM given date fields will be blank) Date Medicare IM given:   Date Additional Medicare IM given:    Discharge Disposition:  HOME W HOME HEALTH SERVICES  Per UR Regulation:  Reviewed for med. necessity/level of care/duration of stay  If discussed at Long Length of Stay Meetings, dates discussed:   02/01/2013  02/03/2013    Comments:  02/04/13 Tametra Ahart,RN,BSN [phone] PT FOR DC HOME TODAY.  PT HAS RW, CANE, BSC, AND SHOWER CHAIR AT HOME.  HE IS ALSO ON CONT CHRONIC HOME OXYGEN AT HOME THROUGH [facility].  WIFE HAS FORGOTTEN HIS PORTABLE TANK TODAY.  SPOKE WITH [facility]:  THEY WILL BE ABLE TO DELIVER PORTABLE TANK TO HOSP ROOM WITHIN 1AND 1/2 HRS OF THIS CALL.  FAXED ORDER FOR OXYGEN WITH UPDATED SETTINGS TO [facility] AT [phone]. PT WILL NEED [facility] AT DC; WIFE STATES SHE WILL BE WORKING BUT HAS FRIENDS AND OTHER FAMILY MEMBERS WHO CAN CHECK ON PT INTERMITTENTLY DURING THE DAY. REFERRAL TO [facility] FOR HH NEEDS; FIRST CHOICE [facility], BUT THEY DO NOT CURRENTLY SERVE [place].  START OF CARE 24-48H POST DC DATE.  02/03/13 Ismelda Weatherman,RN,BSN [phone] PT S/P VFIB ARREST  AND HYPOTHERMIA PROTOCOL...PTA, PT RESIDED AT HOME WITH WIFE AND WAS INDEPENDENT.  BCBS AUTHORIZATION PENDING FOR CIR AND SNF.  CSW NOTIFIED THIS CM THAT WIFE CONSIDERING TAKING PT HOME IF CIR NOT APPROVED AWAIT PAYOR DECISION.  WILL FOLLOW UP IN AM.  1/29 1033a debbie dowell rn,bsn

## 2013-02-03 NOTE — Progress Notes (Signed)
Physical Therapy Treatment Patient Details Name: Jock Mahon MRN: [id number] DOB: [birth year] Today's Date: 02/03/2013 Time: 4132-4401 PT Time Calculation (min): 26 min  PT Assessment / Plan / Recommendation Comments on Treatment Session  Pt s/p pulseless VFib arrest requiring defibrillaion.  Arctic sun protocol on arrival.  UGI bleed and VDRF.  ICD implant 02/02/13.  Continues to make excellent progress.  Feel that pt can go home with use of RW with HHPT f/u.  Needs continued home O2 use as well.      Follow Up Recommendations  Home health PT;Supervision - Intermittent                 Equipment Recommendations  None recommended by PT        Frequency Min 3X/week   Plan Discharge plan needs to be updated;Frequency remains appropriate    Precautions / Restrictions Precautions Precautions: Fall Restrictions Weight Bearing Restrictions: No   Pertinent Vitals/Pain Desats with activity and without O2.  O2 on arrival with 4L O2 was 96%.  O2 at rest on RA was 85-86%.  O2 with ambulation on RA was 81% and with 4LO2 was 88-90%.  Some pain    Mobility  Bed Mobility Bed Mobility: Supine to Sit Supine to Sit: 4: Min guard Sitting - Scoot to Edge of Bed: 4: Min guard Sit to Supine: Not Tested (comment) Details for Bed Mobility Assistance: no assist needed today. Was careful to not overuse left UE Transfers Transfers: Sit to Stand;Stand to Sit Sit to Stand: 5: Supervision;With upper extremity assist;From bed Stand to Sit: 5: Supervision;With upper extremity assist;With armrests;To chair/3-in-1 Stand Pivot Transfers: Not tested (comment) Details for Transfer Assistance: Did not overuse left UE.  Did well with steadiness on feet Ambulation/Gait Ambulation/Gait Assistance: 5: Supervision Ambulation Distance (Feet): 300 Feet Assistive device: Rolling walker Ambulation/Gait Assistance Details: No cues for posture.  Using RW correctly.  Discussed that pt should continue to use RW at  all times on d/c home until his endurance improves a little more and then he can transition back to cane.   Gait Pattern: Decreased stride length Gait velocity: decreased Stairs: No Wheelchair Mobility Wheelchair Mobility: No    PT Goals Acute Rehab PT Goals PT Goal: Rolling Supine to Right Side - Progress: Progressing toward goal PT Goal: Supine/Side to Sit - Progress: Progressing toward goal PT Goal: Sit to Stand - Progress: Progressing toward goal PT Goal: Stand to Sit - Progress: Progressing toward goal PT Transfer Goal: Bed to Chair/Chair to Bed - Progress: Progressing toward goal PT Goal: Stand - Progress: Progressing toward goal PT Goal: Ambulate - Progress: Progressing toward goal  Visit Information  Last PT Received On: 02/03/13 Assistance Needed: +1    Subjective Data  Subjective: "I am much better."   Cognition  Cognition Overall Cognitive Status: Appears within functional limits for tasks assessed/performed Arousal/Alertness: Awake/alert Orientation Level: Appears intact for tasks assessed Behavior During Session: [facility] for tasks performed Current Attention Level: Sustained Following Commands: Follows one step commands consistently    Balance  Static Sitting Balance Static Sitting - Balance Support: No upper extremity supported;Feet supported Static Sitting - Level of Assistance: 6: Modified independent (Device/Increase time) Static Standing Balance Static Standing - Balance Support: During functional activity;Bilateral upper extremity supported (light bil UE support on RW) Static Standing - Level of Assistance: 6: Modified independent (Device/Increase time) Static Standing - Comment/# of Minutes: Can stand statically with RW without difficulty Dynamic Standing Balance Dynamic Standing - Balance Support: Bilateral upper  extremity supported;During functional activity Dynamic Standing - Level of Assistance: 6: Modified independent (Device/Increase time) Dynamic  Standing - Balance Activities: Lateral lean/weight shifting;Forward lean/weight shifting Dynamic Standing - Comments: 1 minute with RW  End of Session PT - End of Session Equipment Utilized During Treatment: Gait belt;Oxygen Activity Tolerance: Patient limited by fatigue Patient left: in chair;with call bell/phone within reach Nurse Communication: Mobility status  [facility] Acute Rehabilitation [phone] (pager)       INGOLD,Marcha Licklider 02/03/2013, 10:56 AM

## 2013-02-03 NOTE — Clinical Social Work Note (Addendum)
CSW followed up to assist with dc planning.  Pt spoke with Pt and his wife re: dc planning process and preauthorization requirements of BCBS.  CSW confirmed with [facility] that they would be able to take Pt today for st-snf.  Pt and his wife are agreeable to SNF. SNF started insurance authorization and received negotiated rate.  SNF stated that they will take Pt today. CSW contacted RN who will notify MD that SNF bed is ready. CSW will send dc info once it is available.    Update: Insurance sent fax to facility with negotiated rate but stating that authorizaiton is pending until tomorrow. 2/7. CSW spoke with Pt's wife and Pt is seriously considering going home with HH. CSW discussed option with wife and stated that if they feel Pt will be successful at home with home health, we will arrange HH at dc.  Case manager made aware of possible HH referral. CSW will f/u with family in the a.m. for final decision.    Frederico Hamman, LCSW [phone]

## 2013-02-04 ENCOUNTER — Telehealth: Payer: Self-pay | Admitting: Cardiology

## 2013-02-04 ENCOUNTER — Encounter (HOSPITAL_COMMUNITY): Payer: Self-pay | Admitting: Cardiology

## 2013-02-04 LAB — CBC
HCT: 47.1 % (ref 39.0–52.0)
Hemoglobin: 15.3 g/dL (ref 13.0–17.0)
MCH: 30.7 pg (ref 26.0–34.0)
MCHC: 32.5 g/dL (ref 30.0–36.0)
MCV: 94.4 fL (ref 78.0–100.0)
Platelets: 417 10*3/uL — ABNORMAL HIGH (ref 150–400)
RBC: 4.99 MIL/uL (ref 4.22–5.81)
RDW: 16.2 % — ABNORMAL HIGH (ref 11.5–15.5)
WBC: 17 10*3/uL — ABNORMAL HIGH (ref 4.0–10.5)

## 2013-02-04 LAB — PROTIME-INR
INR: 2.28 — ABNORMAL HIGH (ref 0.00–1.49)
Prothrombin Time: 24.1 seconds — ABNORMAL HIGH (ref 11.6–15.2)

## 2013-02-04 MED ORDER — WARFARIN SODIUM 2 MG PO TABS
2.0000 mg | ORAL_TABLET | Freq: Once | ORAL | Status: DC
  Filled 2013-02-04: qty 1

## 2013-02-04 MED ORDER — WARFARIN SODIUM 2 MG PO TABS: 2.0000 mg | ORAL_TABLET | ORAL | Status: DC

## 2013-02-04 MED ORDER — ASPIRIN 81 MG PO CHEW: 81.0000 mg | CHEWABLE_TABLET | Freq: Every day | ORAL | Status: DC

## 2013-02-04 MED ORDER — ATORVASTATIN CALCIUM 80 MG PO TABS: 80.0000 mg | ORAL_TABLET | Freq: Every day | ORAL | Status: DC

## 2013-02-04 MED ORDER — CARVEDILOL 6.25 MG PO TABS: 6.2500 mg | ORAL_TABLET | Freq: Two times a day (BID) | ORAL | Status: DC

## 2013-02-04 MED ORDER — LISINOPRIL 5 MG PO TABS: 5.0000 mg | ORAL_TABLET | Freq: Two times a day (BID) | ORAL | Status: DC

## 2013-02-04 MED ORDER — SPIRONOLACTONE 12.5 MG HALF TABLET: 12.5000 mg | ORAL_TABLET | Freq: Every day | ORAL | Status: DC

## 2013-02-04 MED ORDER — FUROSEMIDE 40 MG PO TABS: 40.0000 mg | ORAL_TABLET | Freq: Every day | ORAL | Status: DC

## 2013-02-04 NOTE — Progress Notes (Signed)
Patient's insurance, BCBS, has denied CIR admission. Have informed pt's wife who reports plan is for pt to discharge to home today. Pt's CM aware of above. Please call for questions: [phone]

## 2013-02-04 NOTE — Telephone Encounter (Signed)
7 day tcm pt per [place] pa

## 2013-02-04 NOTE — Progress Notes (Signed)
   SUBJECTIVE:  No chest pain.  Breathing is at baseline.    PHYSICAL EXAM Filed Vitals:   02/03/13 1733 02/03/13 2033 02/03/13 2207 02/04/13 0504  BP: 87/60 106/66  112/69  Pulse: 74 78 79 82  Temp:  97.8 F (36.6 C)  97.7 F (36.5 C)  TempSrc:  Oral  Oral  Resp:  18 18 18   Height:      Weight:      SpO2:  95% 96% 93%   General:  Extubated.  No distress HEENT:  PERRL Lungs:   Bilateral basilar crackles, decreased breath sounds Heart:  Normal S1 and S2 Abdomen:  Decreased bowel sounds Extremities:  Diffuse edema Neuro:  Intact  LABS:  Results for orders placed during the hospital encounter of 01/22/13 (from the past 24 hour(s))  CBC     Status: Abnormal   Collection Time   02/04/13  5:00 AM      Component Value Range   WBC 17.0 (*) 4.0 - 10.5 K/uL   RBC 4.99  4.22 - 5.81 MIL/uL   Hemoglobin 15.3  13.0 - 17.0 g/dL   HCT 09.8  11.9 - 14.7 %   MCV 94.4  78.0 - 100.0 fL   MCH 30.7  26.0 - 34.0 pg   MCHC 32.5  30.0 - 36.0 g/dL   RDW 82.9 (*) 56.2 - 13.0 %   Platelets 417 (*) 150 - 400 K/uL  PROTIME-INR     Status: Abnormal   Collection Time   02/04/13  5:00 AM      Component Value Range   Prothrombin Time 24.1 (*) 11.6 - 15.2 seconds   INR 2.28 (*) 0.00 - 1.49    Intake/Output Summary (Last 24 hours) at 02/04/13 0836 Last data filed at 02/04/13 0831  Gross per 24 hour  Intake    480 ml  Output    625 ml  Net   -145 ml    ASSESSMENT AND PLAN:  Cardiac arrest:   Ischemic cardiomyopathy.  Now s/p ICD.  OK to discharge.  Continue current therapy.  Needs transition of care follow up.    History of clotting disorder.  Back on warfarin and INR is therapeutic.  We will arrange follow up of this as an outpatient.    Respiratory failure:  Improved and at baseline.    Leukocytosis:  Chronic.      Rollene Rotunda 02/04/2013 8:36 AM

## 2013-02-04 NOTE — Telephone Encounter (Signed)
LMTCB    I did not realize pt was just discharged today. Mylo Red RN

## 2013-02-04 NOTE — Telephone Encounter (Signed)
Patient called spoke to wife she stated patient is home from the hospital.Patient understands discharge instructions and how to take medication.Advised to keep post hospital appointment with Ward Givens NP 02/10/13 at 11:30 am.

## 2013-02-04 NOTE — Progress Notes (Signed)
ANTICOAGULATION CONSULT NOTE - Follow Up Consult  Pharmacy Consult for Coumadin Indication: Hx PE/DVT, clotting disorder  Allergies  Allergen Reactions  . Ativan (Lorazepam) Anxiety    [name]    Patient Measurements: Height: 5\' 10"  (177.8 cm) Weight: 201 lb 11.5 oz (91.5 kg) IBW/kg (Calculated) : 73  Heparin Dosing Weight:   Vital Signs: Temp: 97.7 F (36.5 C) (02/07 0504) Temp src: Oral (02/07 0504) BP: 94/62 mmHg (02/07 1022) Pulse Rate: 89  (02/07 0835)  Labs:  Basename 02/04/13 0500 02/03/13 0502 02/02/13 0835  HGB 15.3 15.0 --  HCT 47.1 46.1 46.9  PLT 417* 372 358  APTT -- -- --  LABPROT 24.1* 17.5* 13.7  INR 2.28* 1.48 1.06  HEPARINUNFRC -- -- --  CREATININE -- -- 0.80  CKTOTAL -- -- --  CKMB -- -- --  TROPONINI -- -- --    Estimated Creatinine Clearance: 106.1 ml/min (by C-G formula based on Cr of 0.8).   Medications:  Scheduled:    . antiseptic oral rinse  15 mL Mouth Rinse BID  . aspirin  81 mg Oral Daily  . atorvastatin  80 mg Oral q1800  . carvedilol  6.25 mg Oral BID WC  . furosemide  40 mg Oral Daily  . levETIRAcetam  500 mg Oral BID  . lisinopril  5 mg Oral BID  . pantoprazole  40 mg Oral Q1200  . [COMPLETED] pneumococcal 23 valent vaccine  0.5 mL Intramuscular Tomorrow-1000  . sodium chloride  10-40 mL Intracatheter Q12H  . spironolactone  12.5 mg Oral Daily  . [COMPLETED] warfarin  6 mg Oral Once  . Warfarin - Pharmacist Dosing Inpatient   Does not apply q1800    Assessment: 67yo male with history of DVT and PE, clotting disorder now back on Coumadin.  INR is therapeutic this AM at 2.28.  His INR was supratherapeutic on admission at 3.93, on home dose of 3mg  daily.  Hg is stable.  No bleeding problems noted.  Goal of Therapy:  INR 2-3 Monitor platelets by anticoagulation protocol: Yes   Plan:  1.  Coumadin 2mg  today 2.  Continue daily INR  Marisue Humble, PharmD Clinical Pharmacist [facility]

## 2013-02-04 NOTE — Progress Notes (Signed)
Reviewed discharge instructions with pt and wife questions answered, dc home with belongings, O2 at 4 liters.

## 2013-02-10 ENCOUNTER — Ambulatory Visit: Payer: BC Managed Care – PPO

## 2013-02-10 ENCOUNTER — Ambulatory Visit: Payer: Medicare Other

## 2013-02-10 ENCOUNTER — Encounter: Payer: Medicare Other | Admitting: Nurse Practitioner

## 2013-02-10 ENCOUNTER — Other Ambulatory Visit: Payer: BC Managed Care – PPO

## 2013-02-14 ENCOUNTER — Ambulatory Visit (INDEPENDENT_AMBULATORY_CARE_PROVIDER_SITE_OTHER): Payer: BC Managed Care – PPO | Admitting: *Deleted

## 2013-02-14 ENCOUNTER — Other Ambulatory Visit (INDEPENDENT_AMBULATORY_CARE_PROVIDER_SITE_OTHER): Payer: BC Managed Care – PPO

## 2013-02-14 ENCOUNTER — Other Ambulatory Visit: Payer: Self-pay

## 2013-02-14 ENCOUNTER — Other Ambulatory Visit: Payer: Self-pay | Admitting: *Deleted

## 2013-02-14 DIAGNOSIS — I10 Essential (primary) hypertension: Secondary | ICD-10-CM

## 2013-02-14 DIAGNOSIS — I469 Cardiac arrest, cause unspecified: Secondary | ICD-10-CM

## 2013-02-14 LAB — ICD DEVICE OBSERVATION
BRDY-0002RV: 40 {beats}/min
CHARGE TIME: 7.5 s
DEV-0020ICD: NEGATIVE
DEVICE MODEL ICD: 7040910
FVT: 0
HV IMPEDENCE: 68 Ohm
PACEART VT: 0
RV LEAD AMPLITUDE: 11.8 mv
RV LEAD IMPEDENCE ICD: 512.5 Ohm
RV LEAD THRESHOLD: 0.75 V
TOT-0007: 1
TOT-0008: 0
TOT-0009: 3
TOT-0010: 0
TZAT-0001SLOWVT: 1
TZAT-0004SLOWVT: 8
TZAT-0012SLOWVT: 200 ms
TZAT-0013SLOWVT: 2
TZAT-0018SLOWVT: NEGATIVE
TZAT-0019SLOWVT: 7.5 V
TZAT-0020SLOWVT: 1 ms
TZON-0003SLOWVT: 300 ms
TZON-0004SLOWVT: 30
TZON-0005SLOWVT: 6
TZON-0010SLOWVT: 40 ms
TZST-0001SLOWVT: 2
TZST-0001SLOWVT: 3
TZST-0001SLOWVT: 4
TZST-0001SLOWVT: 5
TZST-0003SLOWVT: 600 V
TZST-0003SLOWVT: 800 V
TZST-0003SLOWVT: 875 V
TZST-0003SLOWVT: 875 V
VENTRICULAR PACING ICD: 0 pct
VF: 0

## 2013-02-14 LAB — BASIC METABOLIC PANEL
BUN: 18 mg/dL (ref 6–23)
CO2: 33 mEq/L — ABNORMAL HIGH (ref 19–32)
Calcium: 9.2 mg/dL (ref 8.4–10.5)
Chloride: 101 mEq/L (ref 96–112)
Creatinine, Ser: 0.8 mg/dL (ref 0.4–1.5)
GFR: 98.84 mL/min (ref >60.00)
Glucose, Bld: 89 mg/dL (ref 70–99)
Potassium: 3.9 mEq/L (ref 3.5–5.1)
Sodium: 140 mEq/L (ref 135–145)

## 2013-02-14 NOTE — Progress Notes (Signed)
Wound check defib in clinic  

## 2013-02-28 ENCOUNTER — Encounter: Payer: Self-pay | Admitting: Internal Medicine

## 2013-03-02 ENCOUNTER — Encounter: Payer: Self-pay | Admitting: Cardiology

## 2013-03-02 ENCOUNTER — Ambulatory Visit (INDEPENDENT_AMBULATORY_CARE_PROVIDER_SITE_OTHER): Payer: BC Managed Care – PPO | Admitting: Cardiology

## 2013-03-02 VITALS — BP 98/65 | HR 97 | Ht 69.0 in | Wt 192.0 lb

## 2013-03-02 DIAGNOSIS — I469 Cardiac arrest, cause unspecified: Secondary | ICD-10-CM

## 2013-03-02 DIAGNOSIS — I6350 Cerebral infarction due to unspecified occlusion or stenosis of unspecified cerebral artery: Secondary | ICD-10-CM

## 2013-03-02 DIAGNOSIS — I639 Cerebral infarction, unspecified: Secondary | ICD-10-CM

## 2013-03-02 DIAGNOSIS — I10 Essential (primary) hypertension: Secondary | ICD-10-CM

## 2013-03-02 DIAGNOSIS — I214 Non-ST elevation (NSTEMI) myocardial infarction: Secondary | ICD-10-CM

## 2013-03-02 DIAGNOSIS — J9600 Acute respiratory failure, unspecified whether with hypoxia or hypercapnia: Secondary | ICD-10-CM
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
## 2013-03-02 NOTE — Patient Instructions (Addendum)
The current medical regimen is effective;  continue present plan and medications.  Follow up with Dr Antoine Poche in 2 months.  You have been referred to Dr Sherene Sires for follow up of your pulmonary issues.

## 2013-03-15 ENCOUNTER — Ambulatory Visit (INDEPENDENT_AMBULATORY_CARE_PROVIDER_SITE_OTHER): Payer: BC Managed Care – PPO | Admitting: Internal Medicine

## 2013-03-15 ENCOUNTER — Encounter: Payer: Self-pay | Admitting: Internal Medicine

## 2013-03-15 VITALS — BP 92/67 | HR 80 | Ht 69.0 in | Wt 192.6 lb

## 2013-03-15 DIAGNOSIS — I2589 Other forms of chronic ischemic heart disease: Secondary | ICD-10-CM

## 2013-03-15 DIAGNOSIS — J9600 Acute respiratory failure, unspecified whether with hypoxia or hypercapnia: Secondary | ICD-10-CM

## 2013-03-15 DIAGNOSIS — I469 Cardiac arrest, cause unspecified: Secondary | ICD-10-CM

## 2013-03-15 DIAGNOSIS — Z95810 Presence of automatic (implantable) cardiac defibrillator: Secondary | ICD-10-CM | POA: Insufficient documentation

## 2013-03-15 DIAGNOSIS — I255 Ischemic cardiomyopathy: Secondary | ICD-10-CM

## 2013-03-15 DIAGNOSIS — J9601 Acute respiratory failure with hypoxia: Secondary | ICD-10-CM

## 2013-03-15 LAB — ICD DEVICE OBSERVATION
BRDY-0002RV: 40 {beats}/min
CHARGE TIME: 7.5 s
DEV-0020ICD: NEGATIVE
DEVICE MODEL ICD: 7040910
FVT: 0
HV IMPEDENCE: 86 Ohm
PACEART VT: 0
RV LEAD AMPLITUDE: 11.8 mv
RV LEAD IMPEDENCE ICD: 562.5 Ohm
RV LEAD THRESHOLD: 0.75 V
TOT-0007: 1
TOT-0008: 0
TOT-0009: 3
TOT-0010: 0
TZAT-0001SLOWVT: 1
TZAT-0004SLOWVT: 8
TZAT-0012SLOWVT: 200 ms
TZAT-0013SLOWVT: 2
TZAT-0018SLOWVT: NEGATIVE
TZAT-0019SLOWVT: 7.5 V
TZAT-0020SLOWVT: 1 ms
TZON-0003SLOWVT: 300 ms
TZON-0004SLOWVT: 30
TZON-0005SLOWVT: 6
TZON-0010SLOWVT: 40 ms
TZST-0001SLOWVT: 2
TZST-0001SLOWVT: 3
TZST-0001SLOWVT: 4
TZST-0001SLOWVT: 5
TZST-0003SLOWVT: 600 V
TZST-0003SLOWVT: 800 V
TZST-0003SLOWVT: 875 V
TZST-0003SLOWVT: 875 V
VENTRICULAR PACING ICD: 0 pct
VF: 0
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
## 2013-03-15 NOTE — Assessment & Plan Note (Addendum)
No intercurrent Ventricular tachycardia  I think the utility of pursuing primary electrical disease is low.  here is only a remote history of sudden death at a 65 year old cousin/second cousin. It is probably so that a false negative EP test is still far more likely than primary electrical disease.

## 2013-03-15 NOTE — Assessment & Plan Note (Signed)
The patient's device was interrogated and the information was fully reviewed.  The device was reprogrammed to  Maximize longevity 

## 2013-03-15 NOTE — Assessment & Plan Note (Signed)
Continue Guideline directed medical therapy, ASA for now

## 2013-03-15 NOTE — Patient Instructions (Addendum)
Remote monitoring is used to monitor your Pacemaker of ICD from home. This monitoring reduces the number of office visits required to check your device to one time per year. It allows [place] to keep an eye on the functioning of your device to ensure it is working properly. You are scheduled for a device check from home on 06/20/13. You may send your transmission at any time that day. If you have a wireless device, the transmission will be sent automatically. After your physician reviews your transmission, you will receive a postcard with your next transmission date.  Your physician wants you to follow-up in: February 2015 with Dr. Graciela Husbands. You will receive a reminder letter in the mail two months in advance. If you don't receive a letter, please call our office to schedule the follow-up appointment.  Your physician recommends that you continue on your current medications as directed. Please refer to the Current Medication list given to you today.

## 2013-03-21 ENCOUNTER — Ambulatory Visit: Payer: BC Managed Care – PPO | Admitting: Physician Assistant

## 2013-03-21 ENCOUNTER — Ambulatory Visit (INDEPENDENT_AMBULATORY_CARE_PROVIDER_SITE_OTHER): Payer: BC Managed Care – PPO | Admitting: Pharmacist

## 2013-03-21 ENCOUNTER — Other Ambulatory Visit: Payer: Self-pay | Admitting: Pharmacist

## 2013-03-21 DIAGNOSIS — I82409 Acute embolism and thrombosis of unspecified deep veins of unspecified lower extremity: Secondary | ICD-10-CM | POA: Insufficient documentation

## 2013-03-21 LAB — POCT INR: INR: 2.2

## 2013-03-21 NOTE — Progress Notes (Signed)
Patient came in for protime

## 2013-03-23 ENCOUNTER — Other Ambulatory Visit: Payer: Self-pay | Admitting: Pharmacist

## 2013-03-23 DIAGNOSIS — I82409 Acute embolism and thrombosis of unspecified deep veins of unspecified lower extremity: Secondary | ICD-10-CM

## 2013-03-23 MED ORDER — WARFARIN SODIUM 2 MG PO TABS: ORAL_TABLET | ORAL | Status: DC

## 2013-03-28 DIAGNOSIS — Z8673 Personal history of transient ischemic attack (TIA), and cerebral infarction without residual deficits: Secondary | ICD-10-CM | POA: Insufficient documentation

## 2013-03-28 DIAGNOSIS — Z8679 Personal history of other diseases of the circulatory system: Secondary | ICD-10-CM | POA: Insufficient documentation

## 2013-03-28 DIAGNOSIS — I219 Acute myocardial infarction, unspecified: Secondary | ICD-10-CM | POA: Insufficient documentation

## 2013-03-29 ENCOUNTER — Ambulatory Visit (INDEPENDENT_AMBULATORY_CARE_PROVIDER_SITE_OTHER): Payer: BC Managed Care – PPO | Admitting: Internal Medicine

## 2013-03-29 ENCOUNTER — Encounter: Payer: Self-pay | Admitting: Internal Medicine

## 2013-03-29 VITALS — BP 110/62 | HR 82 | Temp 98.1°F | Ht 69.0 in | Wt 193.0 lb

## 2013-03-29 DIAGNOSIS — J4489 Other specified chronic obstructive pulmonary disease: Secondary | ICD-10-CM

## 2013-03-29 DIAGNOSIS — R911 Solitary pulmonary nodule: Secondary | ICD-10-CM

## 2013-03-29 DIAGNOSIS — J449 Chronic obstructive pulmonary disease, unspecified: Secondary | ICD-10-CM

## 2013-03-29 NOTE — Patient Instructions (Addendum)
I recommend a follow up cxr in May to compare to previous studies and come to [facility] for PFT's if cxr is abnormal.
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
## 2013-03-30 DIAGNOSIS — R911 Solitary pulmonary nodule: Secondary | ICD-10-CM | POA: Insufficient documentation

## 2013-03-30 NOTE — Assessment & Plan Note (Addendum)
Abnormal Chest CT 01/23/13 - 1.5 cm nodule noted within the left upper lobe; Rec follow up Chest CT after resolution of acute process   cxr 02/03/13 reviewed and shows no nodule at level of carina which should have been obvious based on comparison ct from 1/26 so most likely this was inflammatory, not neoplastic but does need f/u  Discussed in detail all the  indications, usual  risks and alternatives  relative to the benefits with patient who agrees to proceed with conservative f/u given all the other medical issues he's facing with just a plane cxr 05/03/13 and if there is a nodule on L then come to [facility] for f/u and PFT's to decide what to do at that point (placed in tickle file for recall)

## 2013-03-30 NOTE — Assessment & Plan Note (Signed)
Relatively well compensated but at least moderately severe clinically on monotherapy with spriva > no change in rx needes for now

## 2013-04-12 ENCOUNTER — Other Ambulatory Visit: Payer: Self-pay | Admitting: Family Medicine

## 2013-04-19 ENCOUNTER — Telehealth: Payer: Self-pay | Admitting: Cardiology

## 2013-04-19 NOTE — Telephone Encounter (Signed)
Have attempted to call number listed twice (phone picks up but very odd sounds and no voice) to let them know Dr Antoine Poche has not been in the office to address 02 paperwork.  He will be back in the office Thursday.

## 2013-04-19 NOTE — Telephone Encounter (Signed)
New problem     Fax request sent on 4/15 . Please advise regarding oxygen usage.

## 2013-04-21 ENCOUNTER — Ambulatory Visit (INDEPENDENT_AMBULATORY_CARE_PROVIDER_SITE_OTHER): Payer: BC Managed Care – PPO | Admitting: Pharmacist

## 2013-04-21 DIAGNOSIS — I82409 Acute embolism and thrombosis of unspecified deep veins of unspecified lower extremity: Secondary | ICD-10-CM

## 2013-04-21 LAB — POCT INR: INR: 1.8

## 2013-04-21 NOTE — Telephone Encounter (Signed)
F/u   Verified #: per lynn the # is correct but they were having problems with their phones. Her direct ext. [phone]

## 2013-05-02 ENCOUNTER — Telehealth: Payer: Self-pay | Admitting: *Deleted

## 2013-05-02 NOTE — Telephone Encounter (Signed)
LMTCB for the pt 

## 2013-05-02 NOTE — Telephone Encounter (Signed)
Message copied by Christen Butter on Mon May 02, 2013  4:20 PM ------      Message from: Sandrea Hughs B      Created: Wed Mar 30, 2013  8:45 PM       Needs f/u cxr by now either here or in [facility] tomorrow ------

## 2013-05-03 NOTE — Telephone Encounter (Signed)
Called and spoke with the pt's spouse She states that the pt is seeing his PCP at [place] this week and they are going to order cxr Will forward to MW so that he is aware

## 2013-05-03 NOTE — Telephone Encounter (Signed)
Pt's wife returned call and can be reached @ [phone]. Leanora Ivanoff

## 2013-05-10 ENCOUNTER — Encounter: Payer: BC Managed Care – PPO | Admitting: Internal Medicine

## 2013-05-10 ENCOUNTER — Ambulatory Visit (INDEPENDENT_AMBULATORY_CARE_PROVIDER_SITE_OTHER): Payer: BC Managed Care – PPO | Admitting: Pharmacist Clinician (PhC)/ Clinical Pharmacy Specialist

## 2013-05-10 ENCOUNTER — Ambulatory Visit: Payer: BC Managed Care – PPO | Admitting: Family Medicine

## 2013-05-10 DIAGNOSIS — I82409 Acute embolism and thrombosis of unspecified deep veins of unspecified lower extremity: Secondary | ICD-10-CM

## 2013-05-10 LAB — POCT INR: INR: 1.9

## 2013-05-11 ENCOUNTER — Encounter: Payer: Self-pay | Admitting: Cardiology

## 2013-05-11 ENCOUNTER — Ambulatory Visit (INDEPENDENT_AMBULATORY_CARE_PROVIDER_SITE_OTHER): Payer: BC Managed Care – PPO | Admitting: Cardiology

## 2013-05-11 VITALS — BP 92/66 | HR 78 | Ht 69.0 in | Wt 196.0 lb

## 2013-05-11 DIAGNOSIS — I420 Dilated cardiomyopathy: Secondary | ICD-10-CM

## 2013-05-11 DIAGNOSIS — I428 Other cardiomyopathies: Secondary | ICD-10-CM

## 2013-05-11 NOTE — Patient Instructions (Addendum)
The current medical regimen is effective;  continue present plan and medications.  Your physician has requested that you have an echocardiogram. Echocardiography is a painless test that uses sound waves to create images of your heart. It provides your doctor with information about the size and shape of your heart and how well your heart's chambers and valves are working. This procedure takes approximately one hour. There are no restrictions for this procedure.  Follow up with Dr Antoine Poche in August 2014 in [place].
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
## 2013-05-13 ENCOUNTER — Ambulatory Visit (INDEPENDENT_AMBULATORY_CARE_PROVIDER_SITE_OTHER): Payer: BC Managed Care – PPO | Admitting: Family Medicine

## 2013-05-13 ENCOUNTER — Ambulatory Visit (INDEPENDENT_AMBULATORY_CARE_PROVIDER_SITE_OTHER): Payer: BC Managed Care – PPO

## 2013-05-13 ENCOUNTER — Encounter: Payer: Self-pay | Admitting: Family Medicine

## 2013-05-13 VITALS — BP 95/62 | HR 69 | Temp 97.8°F

## 2013-05-13 DIAGNOSIS — R9389 Abnormal findings on diagnostic imaging of other specified body structures: Secondary | ICD-10-CM | POA: Insufficient documentation

## 2013-05-13 DIAGNOSIS — E785 Hyperlipidemia, unspecified: Secondary | ICD-10-CM | POA: Insufficient documentation

## 2013-05-13 DIAGNOSIS — J441 Chronic obstructive pulmonary disease with (acute) exacerbation: Secondary | ICD-10-CM | POA: Insufficient documentation

## 2013-05-13 DIAGNOSIS — I2510 Atherosclerotic heart disease of native coronary artery without angina pectoris: Secondary | ICD-10-CM | POA: Insufficient documentation

## 2013-05-13 DIAGNOSIS — R918 Other nonspecific abnormal finding of lung field: Secondary | ICD-10-CM

## 2013-05-13 LAB — POCT CBC
Granulocyte percent: 59.6 %G (ref 37–80)
HCT, POC: 46.8 % (ref 43.5–53.7)
Hemoglobin: 15.4 g/dL (ref 14.1–18.1)
Lymph, poc: 6.2 — AB (ref 0.6–3.4)
MCH, POC: 29.8 pg (ref 27–31.2)
MCHC: 33 g/dL (ref 31.8–35.4)
MCV: 90.1 fL (ref 80–97)
MPV: 8.5 fL (ref 0–99.8)
POC Granulocyte: 10 — AB (ref 2–6.9)
POC LYMPH PERCENT: 36.8 %L (ref 10–50)
Platelet Count, POC: 317 10*3/uL (ref 142–424)
RBC: 5.2 M/uL (ref 4.69–6.13)
RDW, POC: 14.9 %
WBC: 16.8 10*3/uL — AB (ref 4.6–10.2)

## 2013-05-13 LAB — COMPLETE METABOLIC PANEL WITH GFR
ALT: 18 U/L (ref 0–53)
AST: 20 U/L (ref 0–37)
Albumin: 4.3 g/dL (ref 3.5–5.2)
Alkaline Phosphatase: 147 U/L — ABNORMAL HIGH (ref 39–117)
BUN: 29 mg/dL — ABNORMAL HIGH (ref 6–23)
CO2: 32 mEq/L (ref 19–32)
Calcium: 10 mg/dL (ref 8.4–10.5)
Chloride: 102 mEq/L (ref 96–112)
Creat: 1.05 mg/dL (ref 0.50–1.35)
GFR, Est African American: 86 mL/min
GFR, Est Non African American: 74 mL/min
Glucose, Bld: 79 mg/dL (ref 70–99)
Potassium: 5.7 mEq/L — ABNORMAL HIGH (ref 3.5–5.3)
Sodium: 142 mEq/L (ref 135–145)
Total Bilirubin: 0.4 mg/dL (ref 0.3–1.2)
Total Protein: 7.6 g/dL (ref 6.0–8.3)

## 2013-05-13 MED ORDER — TIOTROPIUM BROMIDE MONOHYDRATE 18 MCG IN CAPS: ORAL_CAPSULE | RESPIRATORY_TRACT | Status: DC

## 2013-05-13 NOTE — Patient Instructions (Addendum)
      Dr Jericho Alcorn's Recommendations  Diet and Exercise discussed with patient.  For nutrition information, I recommend books:  1).Eat to Live by Dr Joel Fuhrman. 2).Prevent and Reverse Heart Disease by Dr Caldwell Esselstyn. 3) Dr Neal Barnard's Book: Reversing Diabetes  Exercise recommendations are:  If unable to walk, then the patient can exercise in a chair 3 times a day. By flapping arms like a bird gently and raising legs outwards to the front.  If ambulatory, the patient can go for walks for 30 minutes 3 times a week. Then increase the intensity and duration as tolerated.  Goal is to try to attain exercise frequency to 5 times a week.  If applicable: Best to perform resistance exercises (machines or weights) 2 days a week and cardio type exercises 3 days per week.  

## 2013-05-13 NOTE — Progress Notes (Signed)
Patient ID: Travis Cruz, male   DOB: 05-19-1946, 67 y.o.   MRN: [id number] SUBJECTIVE: HPI: Patient is here for follow up of hyperlipidemia/CVA/COPD/Abnormal CXray/:  denies Headache;denies Chest Pain;denies weakness;denies Shortness of Breath and orthopnea;denies Visual changes;denies palpitations;denies cough;denies pedal edema;denies symptoms of TIA or stroke;deniesClaudication symptoms. admits to Compliance with medications; denies Problems with medications.  Needs to get a followup CXray and if still abnormal to follo wup with Dr Sherene Sires.  PMH/PSH: reviewed/updated in Epic  SH/FH: reviewed/updated in Epic  Allergies: reviewed/updated in Epic  Medications: reviewed/updated in Epic  Immunizations: reviewed/updated in Epic  ROS: As above in the HPI. All other systems are stable or negative.  OBJECTIVE: APPEARANCE:  Patient in no acute distress.The patient appeared well nourished and normally developed. Acyanotic. Waist: VITAL SIGNS:BP 95/62  Pulse 69  Temp(Src) 97.8 F (36.6 C) (Oral) Obese WM  SKIN: warm and  Dry without overt rashes, tattoos and scars  HEAD and Neck: without JVD, Head and scalp: normal Eyes:No scleral icterus. Fundi normal, eye movements normal. Ears: Auricle normal, canal normal, Tympanic membranes normal, insufflation normal. Nose: normal Throat: normal Neck & thyroid: normal  CHEST & LUNGS: Chest wall: normal Lungs: Clear  CVS: Reveals the PMI to be normally located. Regular rhythm, First and Second Heart sounds are normal,  absence of murmurs, rubs or gallops. Peripheral vasculature: Radial pulses: normal Dorsal pedis pulses: normal Posterior pulses: normal  ABDOMEN:  Appearance: normal Benign,, no organomegaly, no masses, no Abdominal Aortic enlargement. No Guarding , no rebound. No Bruits. Bowel sounds: normal  RECTAL: N/A GU: N/A  EXTREMETIES: nonedematous. Both Femoral and Pedal pulses are normal.  MUSCULOSKELETAL:   Spine: normal  NEUROLOGIC: oriented to time,place and person;   ASSESSMENT: Abnormal chest x-ray - Plan: DG Chest 2 View, POCT CBC  COPD exacerbation - Plan: tiotropium (SPIRIVA HANDIHALER) 18 MCG inhalation capsule, POCT CBC  HLD (hyperlipidemia) - Plan: COMPLETE METABOLIC PANEL WITH GFR, NMR Lipoprofile with Lipids, POCT CBC  CAD (coronary atherosclerotic disease) - Plan: POCT CBC    PLAN: [facility] reading (PRIMARY) by  Dr.Malary Aylesworth: defibrillator in place , Left upper chest . Chronic  Changes.? Effusion LLL Await official over read.  Orders Placed This Encounter  Procedures  . DG Chest 2 View    Standing Status: Future     Number of Occurrences: 1     Standing Expiration Date: 07/13/2014    Order Specific Question:  Reason for Exam (SYMPTOM  OR DIAGNOSIS REQUIRED)    Answer:  follow jup nodule    Order Specific Question:  Preferred imaging location?    Answer:  Internal  . COMPLETE METABOLIC PANEL WITH GFR  . NMR Lipoprofile with Lipids  . POCT CBC   Meds ordered this encounter  Medications  . tiotropium (SPIRIVA HANDIHALER) 18 MCG inhalation capsule    Sig: INSERT CAPSULE INTO HANDIHALER AND USE 1 PUFF DAILY    Dispense:  30 capsule    Refill:  11        Dr Woodroe Mode Recommendations  Diet and Exercise discussed with patient.  For nutrition information, I recommend books:  1).Eat to Live by Dr Monico Hoar. 2).Prevent and Reverse Heart Disease by Dr Suzzette Righter. 3) Dr Katherina Right Book: Reversing Diabetes  Exercise recommendations are:  If unable to walk, then the patient can exercise in a chair 3 times a day. By flapping arms like a bird gently and raising legs outwards to the front.  If ambulatory, the patient can go for  walks for 30 minutes 3 times a week. Then increase the intensity and duration as tolerated.  Goal is to try to attain exercise frequency to 5 times a week.  If applicable: Best to perform resistance exercises (machines or  weights) 2 days a week and cardio type exercises 3 days per week.   RTC 3 months.  Leilanny Fluitt P. Modesto Charon, M.D.

## 2013-05-13 NOTE — Progress Notes (Signed)
Patients wife requests copy of labs to pck up and copy faxed to DR.Rolene Arbour @ [facility]

## 2013-05-16 ENCOUNTER — Encounter: Payer: Self-pay | Admitting: Internal Medicine

## 2013-05-16 ENCOUNTER — Telehealth: Payer: Self-pay | Admitting: Family Medicine

## 2013-05-16 NOTE — Progress Notes (Signed)
Quick Note:  Chest Xray is abnormal. Informed patient. He says he needs to speak with his wife whether to see Dr Sherene Sires as was instructed if Chest Xray was abnormal or do Ct Chest Also , found to have findings Suggesting osteoporosis , fractures etc. Should get a DEXA. Patient declines until he discusses with his wife. He will call back. Please give a follow up call tomorrow. ______

## 2013-05-16 NOTE — Telephone Encounter (Signed)
Travis Cruz is aware of the recommendations. 1) will see Dr Sherene Sires. 2) Will discuss and consider DEXA and let us know.

## 2013-05-18 ENCOUNTER — Telehealth: Payer: Self-pay | Admitting: Internal Medicine

## 2013-05-18 DIAGNOSIS — J449 Chronic obstructive pulmonary disease, unspecified: Secondary | ICD-10-CM

## 2013-05-18 DIAGNOSIS — R911 Solitary pulmonary nodule: Secondary | ICD-10-CM

## 2013-05-18 NOTE — Telephone Encounter (Signed)
Notes Recorded by Nyoka Cowden, MD on 05/16/2013 at 5:02 PM Call pt: Reviewed cxr and Unfortunately the pacemaker is right on top of the area of concern so needs a limited non-contrasted CT to f/u the nodule ---  Spouse aware of results. Order has been placed. Please advise PCC's thanks

## 2013-05-18 NOTE — Telephone Encounter (Signed)
Spoke with Mindy Msg sent to MW in error The CT order was sent to [facility] and they will contact the pt with appt

## 2013-05-19 LAB — NMR LIPOPROFILE WITH LIPIDS
Cholesterol, Total: 133 mg/dL (ref <200)
HDL Particle Number: 27 umol/L — ABNORMAL LOW (ref >=30.5)
HDL Size: 8.8 nm — ABNORMAL LOW (ref >=9.2)
HDL-C: 37 mg/dL — ABNORMAL LOW (ref >=40)
LDL (calc): 67 mg/dL (ref <100)
LDL Particle Number: 607 nmol/L (ref <1000)
LDL Size: 21.7 nm (ref >20.5)
LP-IR Score: 51 — ABNORMAL HIGH (ref <=45)
Large HDL-P: 3.5 umol/L — ABNORMAL LOW (ref >=4.8)
Large VLDL-P: 3.1 nmol/L — ABNORMAL HIGH (ref <=2.7)
Small LDL Particle Number: 107 nmol/L (ref <=527)
Triglycerides: 144 mg/dL (ref <150)
VLDL Size: 48.4 nm — ABNORMAL HIGH (ref <=46.6)

## 2013-05-25 ENCOUNTER — Encounter: Payer: Self-pay | Admitting: Internal Medicine

## 2013-05-25 ENCOUNTER — Ambulatory Visit (INDEPENDENT_AMBULATORY_CARE_PROVIDER_SITE_OTHER)
Admission: RE | Admit: 2013-05-25 | Discharge: 2013-05-25 | Disposition: A | Discharge status: Home / Self Care | Payer: BC Managed Care – PPO | Source: Ambulatory Visit | Attending: Internal Medicine | Admitting: Internal Medicine

## 2013-05-25 ENCOUNTER — Ambulatory Visit (INDEPENDENT_AMBULATORY_CARE_PROVIDER_SITE_OTHER): Payer: BC Managed Care – PPO | Admitting: Pharmacist

## 2013-05-25 DIAGNOSIS — J449 Chronic obstructive pulmonary disease, unspecified: Secondary | ICD-10-CM

## 2013-05-25 DIAGNOSIS — R911 Solitary pulmonary nodule: Secondary | ICD-10-CM

## 2013-05-25 DIAGNOSIS — I82409 Acute embolism and thrombosis of unspecified deep veins of unspecified lower extremity: Secondary | ICD-10-CM

## 2013-05-25 LAB — POCT INR: INR: 2.2

## 2013-05-25 NOTE — Patient Instructions (Signed)
Anticoagulation Dose Instructions as of 05/25/2013     Glynis Smiles Tue Wed Thu Fri Sat   New Dose 4 mg 2 mg 4 mg 2 mg 4 mg 2 mg 4 mg    Description       Continue same dose of 4mg  [= 2 tablets] on Sundays, Tuesdays, Thursdays and Saturdays.  2mg  [= 1 tablet] Mondays, Wednesdays and Fridays       INR was 2.2 today

## 2013-05-27 ENCOUNTER — Other Ambulatory Visit: Payer: Self-pay | Admitting: Internal Medicine

## 2013-05-27 ENCOUNTER — Telehealth: Payer: Self-pay | Admitting: Internal Medicine

## 2013-05-27 DIAGNOSIS — R911 Solitary pulmonary nodule: Secondary | ICD-10-CM

## 2013-05-27 NOTE — Telephone Encounter (Signed)
Pt's wife asks to speak w/ [name] & can be reached at [phone].  Antionette Fairy

## 2013-05-27 NOTE — Progress Notes (Signed)
Quick Note:  Spoke with pt's spouse and notified of results per Dr. Sherene Sires.   ______

## 2013-05-27 NOTE — Telephone Encounter (Signed)
Result Note    Call patient : Study is c/w minimal growth but this will need further eval with PET scan next then ov with pft's to regroup re how to go from there   Spouse aware of the above results  Will discuss this with the pt and call back if okay with ordering PET

## 2013-05-27 NOTE — Telephone Encounter (Signed)
Spoke with [name] Pt okay with PET Order was sent to [facility] and [name] transferred to schedulers to set up PFT and ROV with MW

## 2013-05-30 NOTE — Telephone Encounter (Signed)
Have never been able to contact and they have not called back- will close this note

## 2013-06-01 ENCOUNTER — Encounter: Payer: Self-pay | Admitting: Internal Medicine

## 2013-06-01 ENCOUNTER — Encounter (HOSPITAL_COMMUNITY)
Admission: RE | Admit: 2013-06-01 | Discharge: 2013-06-01 | Disposition: A | Discharge status: Home / Self Care | Payer: BC Managed Care – PPO | Source: Ambulatory Visit | Attending: Internal Medicine | Admitting: Internal Medicine

## 2013-06-01 DIAGNOSIS — R911 Solitary pulmonary nodule: Secondary | ICD-10-CM

## 2013-06-01 LAB — GLUCOSE, CAPILLARY: Glucose-Capillary: 92 mg/dL (ref 70–99)

## 2013-06-01 MED ORDER — FLUDEOXYGLUCOSE F - 18 (FDG) INJECTION
16.4000 | Freq: Once | INTRAVENOUS | Status: AC | PRN
  Administered 2013-06-01: 16.4 via INTRAVENOUS

## 2013-06-01 NOTE — Progress Notes (Signed)
Quick Note:  Spoke with the pt's daughter and notified of results/recs Pt to keep appt for next wk with PFT ______

## 2013-06-06 ENCOUNTER — Ambulatory Visit (HOSPITAL_COMMUNITY)
Admission: RE | Admit: 2013-06-06 | Discharge: 2013-06-06 | Disposition: A | Discharge status: Home / Self Care | Payer: BC Managed Care – PPO | Source: Ambulatory Visit | Attending: Internal Medicine | Admitting: Internal Medicine

## 2013-06-06 ENCOUNTER — Ambulatory Visit (INDEPENDENT_AMBULATORY_CARE_PROVIDER_SITE_OTHER): Payer: BC Managed Care – PPO | Admitting: Internal Medicine

## 2013-06-06 ENCOUNTER — Encounter: Payer: Self-pay | Admitting: Internal Medicine

## 2013-06-06 VITALS — BP 90/60 | HR 80 | Temp 97.3°F | Ht 69.0 in | Wt 195.0 lb

## 2013-06-06 DIAGNOSIS — R911 Solitary pulmonary nodule: Secondary | ICD-10-CM

## 2013-06-06 DIAGNOSIS — J449 Chronic obstructive pulmonary disease, unspecified: Secondary | ICD-10-CM

## 2013-06-06 DIAGNOSIS — J4489 Other specified chronic obstructive pulmonary disease: Secondary | ICD-10-CM | POA: Insufficient documentation

## 2013-06-06 LAB — PULMONARY FUNCTION TEST

## 2013-06-06 MED ORDER — ALBUTEROL SULFATE (5 MG/ML) 0.5% IN NEBU
2.5000 mg | INHALATION_SOLUTION | Freq: Once | RESPIRATORY_TRACT | Status: AC
  Administered 2013-06-06: 2.5 mg via RESPIRATORY_TRACT

## 2013-06-06 NOTE — Patient Instructions (Addendum)
Please see patient coordinator before you leave today  to schedule Thoracic surgery consultation.
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
## 2013-06-07 ENCOUNTER — Ambulatory Visit: Payer: BC Managed Care – PPO | Admitting: Internal Medicine

## 2013-06-07 NOTE — Assessment & Plan Note (Signed)
Abnormal Chest CT 01/23/13 - 1.5 cm nodule noted within the left upper lobe; Rec follow up Chest CT 05/25/2013 > slt increase > PET 06/01/2013 > POS uptake, slt serial growth.   His lung function will allow a LULobectomy if needed for surgical cure for what is in all likelihood a Stage I tumor if he can be cleared for surgery by cards.  Discussed in detail all the  indications, usual  risks and alternatives  relative to the benefits with patient who agrees to proceed with T surg referral

## 2013-06-07 NOTE — Assessment & Plan Note (Signed)
-   PFTs 06/06/2013 FEV1 1.96 (66%) ratio 63 and no better p B2  DLCO 52 corrects to 74%  Not presently limited by sob and no tendency to aecopd so rec continue monotherapy with LAMA = spiriva

## 2013-06-15 ENCOUNTER — Encounter: Payer: BC Managed Care – PPO | Admitting: Cardiothoracic Surgery

## 2013-06-17 ENCOUNTER — Institutional Professional Consult (permissible substitution) (INDEPENDENT_AMBULATORY_CARE_PROVIDER_SITE_OTHER): Payer: BC Managed Care – PPO | Admitting: Cardiothoracic Surgery

## 2013-06-17 ENCOUNTER — Encounter: Payer: BC Managed Care – PPO | Admitting: Cardiothoracic Surgery

## 2013-06-17 VITALS — BP 80/54 | HR 80 | Resp 16 | Ht 69.0 in | Wt 192.0 lb

## 2013-06-17 DIAGNOSIS — J984 Other disorders of lung: Secondary | ICD-10-CM

## 2013-06-17 DIAGNOSIS — R911 Solitary pulmonary nodule: Secondary | ICD-10-CM
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
## 2013-06-20 ENCOUNTER — Encounter: Payer: Self-pay | Admitting: Internal Medicine

## 2013-06-20 ENCOUNTER — Ambulatory Visit (INDEPENDENT_AMBULATORY_CARE_PROVIDER_SITE_OTHER): Payer: BC Managed Care – PPO | Admitting: *Deleted

## 2013-06-20 ENCOUNTER — Other Ambulatory Visit: Payer: Self-pay | Admitting: *Deleted

## 2013-06-20 DIAGNOSIS — I469 Cardiac arrest, cause unspecified: Secondary | ICD-10-CM

## 2013-06-20 DIAGNOSIS — Z95810 Presence of automatic (implantable) cardiac defibrillator: Secondary | ICD-10-CM

## 2013-06-20 DIAGNOSIS — R519 Headache, unspecified: Secondary | ICD-10-CM

## 2013-06-20 DIAGNOSIS — R42 Dizziness and giddiness: Secondary | ICD-10-CM

## 2013-06-20 DIAGNOSIS — R918 Other nonspecific abnormal finding of lung field: Secondary | ICD-10-CM

## 2013-06-20 LAB — REMOTE ICD DEVICE
BRDY-0002RV: 40 {beats}/min
DEV-0020ICD: NEGATIVE
DEVICE MODEL ICD: 7040910
HV IMPEDENCE: 72 Ohm
RV LEAD AMPLITUDE: 11.8 mv
RV LEAD IMPEDENCE ICD: 510 Ohm
TZAT-0001SLOWVT: 1
TZAT-0004SLOWVT: 8
TZAT-0012SLOWVT: 200 ms
TZAT-0013SLOWVT: 2
TZAT-0018SLOWVT: NEGATIVE
TZAT-0019SLOWVT: 7.5 V
TZAT-0020SLOWVT: 1 ms
TZON-0003SLOWVT: 300 ms
TZON-0004SLOWVT: 30
TZON-0005SLOWVT: 6
TZON-0010SLOWVT: 40 ms
TZST-0001SLOWVT: 2
TZST-0001SLOWVT: 3
TZST-0001SLOWVT: 4
TZST-0001SLOWVT: 5
TZST-0003SLOWVT: 600 V
TZST-0003SLOWVT: 800 V
TZST-0003SLOWVT: 875 V
TZST-0003SLOWVT: 875 V
VENTRICULAR PACING ICD: 1 pct

## 2013-06-23 ENCOUNTER — Other Ambulatory Visit: Payer: Self-pay | Admitting: *Deleted

## 2013-06-23 ENCOUNTER — Ambulatory Visit
Admission: RE | Admit: 2013-06-23 | Discharge: 2013-06-23 | Disposition: A | Discharge status: Home / Self Care | Payer: BC Managed Care – PPO | Source: Ambulatory Visit | Attending: Cardiothoracic Surgery | Admitting: Cardiothoracic Surgery

## 2013-06-23 ENCOUNTER — Encounter: Payer: Self-pay | Admitting: Cardiothoracic Surgery

## 2013-06-23 ENCOUNTER — Ambulatory Visit (INDEPENDENT_AMBULATORY_CARE_PROVIDER_SITE_OTHER): Payer: BC Managed Care – PPO | Admitting: Cardiothoracic Surgery

## 2013-06-23 VITALS — BP 79/51 | HR 103 | Resp 16 | Ht 69.0 in | Wt 192.0 lb

## 2013-06-23 DIAGNOSIS — R918 Other nonspecific abnormal finding of lung field: Secondary | ICD-10-CM

## 2013-06-23 DIAGNOSIS — R911 Solitary pulmonary nodule: Secondary | ICD-10-CM

## 2013-06-23 DIAGNOSIS — J984 Other disorders of lung: Secondary | ICD-10-CM

## 2013-06-23 DIAGNOSIS — R519 Headache, unspecified: Secondary | ICD-10-CM

## 2013-06-23 DIAGNOSIS — R42 Dizziness and giddiness: Secondary | ICD-10-CM

## 2013-06-23 MED ORDER — IOHEXOL 300 MG/ML  SOLN
75.0000 mL | Freq: Once | INTRAMUSCULAR | Status: AC | PRN
  Administered 2013-06-23: 75 mL via INTRAVENOUS
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
## 2013-06-27 ENCOUNTER — Inpatient Hospital Stay (HOSPITAL_COMMUNITY)
Admission: EM | Admit: 2013-06-27 | Discharge: 2013-07-01 | DRG: 552 | Disposition: A | Discharge status: Home / Self Care | Payer: BC Managed Care – PPO | Attending: Internal Medicine | Admitting: Internal Medicine

## 2013-06-27 ENCOUNTER — Telehealth: Payer: Self-pay | Admitting: Pharmacist

## 2013-06-27 ENCOUNTER — Other Ambulatory Visit: Payer: Self-pay

## 2013-06-27 ENCOUNTER — Encounter (HOSPITAL_COMMUNITY): Payer: Self-pay

## 2013-06-27 DIAGNOSIS — Z86711 Personal history of pulmonary embolism: Secondary | ICD-10-CM

## 2013-06-27 DIAGNOSIS — R9389 Abnormal findings on diagnostic imaging of other specified body structures: Secondary | ICD-10-CM

## 2013-06-27 DIAGNOSIS — I2510 Atherosclerotic heart disease of native coronary artery without angina pectoris: Secondary | ICD-10-CM | POA: Diagnosis present

## 2013-06-27 DIAGNOSIS — IMO0002 Reserved for concepts with insufficient information to code with codable children: Secondary | ICD-10-CM | POA: Diagnosis not present

## 2013-06-27 DIAGNOSIS — K5731 Diverticulosis of large intestine without perforation or abscess with bleeding: Secondary | ICD-10-CM | POA: Diagnosis present

## 2013-06-27 DIAGNOSIS — K625 Hemorrhage of anus and rectum: Secondary | ICD-10-CM | POA: Diagnosis present

## 2013-06-27 DIAGNOSIS — I82409 Acute embolism and thrombosis of unspecified deep veins of unspecified lower extremity: Secondary | ICD-10-CM

## 2013-06-27 DIAGNOSIS — J449 Chronic obstructive pulmonary disease, unspecified: Secondary | ICD-10-CM

## 2013-06-27 DIAGNOSIS — I255 Ischemic cardiomyopathy: Secondary | ICD-10-CM | POA: Diagnosis present

## 2013-06-27 DIAGNOSIS — I509 Heart failure, unspecified: Secondary | ICD-10-CM | POA: Diagnosis present

## 2013-06-27 DIAGNOSIS — D126 Benign neoplasm of colon, unspecified: Principal | ICD-10-CM | POA: Diagnosis present

## 2013-06-27 DIAGNOSIS — G4733 Obstructive sleep apnea (adult) (pediatric): Secondary | ICD-10-CM | POA: Diagnosis present

## 2013-06-27 DIAGNOSIS — J438 Other emphysema: Secondary | ICD-10-CM | POA: Diagnosis present

## 2013-06-27 DIAGNOSIS — Z87891 Personal history of nicotine dependence: Secondary | ICD-10-CM

## 2013-06-27 DIAGNOSIS — R911 Solitary pulmonary nodule: Secondary | ICD-10-CM | POA: Diagnosis present

## 2013-06-27 DIAGNOSIS — J441 Chronic obstructive pulmonary disease with (acute) exacerbation: Secondary | ICD-10-CM

## 2013-06-27 DIAGNOSIS — I639 Cerebral infarction, unspecified: Secondary | ICD-10-CM

## 2013-06-27 DIAGNOSIS — L089 Local infection of the skin and subcutaneous tissue, unspecified: Secondary | ICD-10-CM

## 2013-06-27 DIAGNOSIS — Z95810 Presence of automatic (implantable) cardiac defibrillator: Secondary | ICD-10-CM | POA: Diagnosis present

## 2013-06-27 DIAGNOSIS — Z7901 Long term (current) use of anticoagulants: Secondary | ICD-10-CM

## 2013-06-27 DIAGNOSIS — D62 Acute posthemorrhagic anemia: Secondary | ICD-10-CM | POA: Diagnosis present

## 2013-06-27 DIAGNOSIS — I2699 Other pulmonary embolism without acute cor pulmonale: Secondary | ICD-10-CM | POA: Diagnosis present

## 2013-06-27 DIAGNOSIS — Y9210 Unspecified residential institution as the place of occurrence of the external cause: Secondary | ICD-10-CM | POA: Diagnosis present

## 2013-06-27 DIAGNOSIS — I469 Cardiac arrest, cause unspecified: Secondary | ICD-10-CM

## 2013-06-27 DIAGNOSIS — C3490 Malignant neoplasm of unspecified part of unspecified bronchus or lung: Secondary | ICD-10-CM | POA: Diagnosis present

## 2013-06-27 DIAGNOSIS — Z9981 Dependence on supplemental oxygen: Secondary | ICD-10-CM

## 2013-06-27 DIAGNOSIS — I959 Hypotension, unspecified: Secondary | ICD-10-CM | POA: Diagnosis present

## 2013-06-27 DIAGNOSIS — E785 Hyperlipidemia, unspecified: Secondary | ICD-10-CM

## 2013-06-27 DIAGNOSIS — I498 Other specified cardiac arrhythmias: Secondary | ICD-10-CM

## 2013-06-27 DIAGNOSIS — R569 Unspecified convulsions: Secondary | ICD-10-CM

## 2013-06-27 DIAGNOSIS — Z86718 Personal history of other venous thrombosis and embolism: Secondary | ICD-10-CM

## 2013-06-27 DIAGNOSIS — I2589 Other forms of chronic ischemic heart disease: Secondary | ICD-10-CM

## 2013-06-27 DIAGNOSIS — Y849 Medical procedure, unspecified as the cause of abnormal reaction of the patient, or of later complication, without mention of misadventure at the time of the procedure: Secondary | ICD-10-CM | POA: Diagnosis present

## 2013-06-27 DIAGNOSIS — Z8673 Personal history of transient ischemic attack (TIA), and cerebral infarction without residual deficits: Secondary | ICD-10-CM

## 2013-06-27 DIAGNOSIS — I10 Essential (primary) hypertension: Secondary | ICD-10-CM | POA: Diagnosis present

## 2013-06-27 DIAGNOSIS — I214 Non-ST elevation (NSTEMI) myocardial infarction: Secondary | ICD-10-CM

## 2013-06-27 DIAGNOSIS — Z7982 Long term (current) use of aspirin: Secondary | ICD-10-CM

## 2013-06-27 HISTORY — DX: Acute respiratory distress syndrome: J80

## 2013-06-27 HISTORY — DX: Hemorrhage of anus and rectum: K62.5

## 2013-06-27 HISTORY — DX: Presence of automatic (implantable) cardiac defibrillator: Z95.810

## 2013-06-27 HISTORY — DX: Unspecified osteoarthritis, unspecified site: M19.90

## 2013-06-27 HISTORY — DX: Heart failure, unspecified: I50.9

## 2013-06-27 HISTORY — DX: Ventricular fibrillation: I49.01

## 2013-06-27 HISTORY — DX: Acute myocardial infarction, unspecified: I21.9

## 2013-06-27 LAB — COMPREHENSIVE METABOLIC PANEL
ALT: 11 U/L (ref 0–53)
AST: 15 U/L (ref 0–37)
Albumin: 3 g/dL — ABNORMAL LOW (ref 3.5–5.2)
Alkaline Phosphatase: 118 U/L — ABNORMAL HIGH (ref 39–117)
BUN: 43 mg/dL — ABNORMAL HIGH (ref 6–23)
CO2: 25 mEq/L (ref 19–32)
Calcium: 8.3 mg/dL — ABNORMAL LOW (ref 8.4–10.5)
Chloride: 104 mEq/L (ref 96–112)
Creatinine, Ser: 1.67 mg/dL — ABNORMAL HIGH (ref 0.50–1.35)
GFR calc Af Amer: 48 mL/min — ABNORMAL LOW (ref >90)
GFR calc non Af Amer: 41 mL/min — ABNORMAL LOW (ref >90)
Glucose, Bld: 101 mg/dL — ABNORMAL HIGH (ref 70–99)
Potassium: 4.6 mEq/L (ref 3.5–5.1)
Sodium: 139 mEq/L (ref 135–145)
Total Bilirubin: 0.2 mg/dL — ABNORMAL LOW (ref 0.3–1.2)
Total Protein: 6.2 g/dL (ref 6.0–8.3)

## 2013-06-27 LAB — CBC
HCT: 32.2 % — ABNORMAL LOW (ref 39.0–52.0)
HCT: 26 % — ABNORMAL LOW (ref 39.0–52.0)
Hemoglobin: 10.1 g/dL — ABNORMAL LOW (ref 13.0–17.0)
Hemoglobin: 8.4 g/dL — ABNORMAL LOW (ref 13.0–17.0)
MCH: 28.3 pg (ref 26.0–34.0)
MCH: 29.2 pg (ref 26.0–34.0)
MCHC: 31.4 g/dL (ref 30.0–36.0)
MCHC: 32.3 g/dL (ref 30.0–36.0)
MCV: 90.2 fL (ref 78.0–100.0)
MCV: 90.3 fL (ref 78.0–100.0)
Platelets: 345 10*3/uL (ref 150–400)
Platelets: 297 10*3/uL (ref 150–400)
RBC: 3.57 MIL/uL — ABNORMAL LOW (ref 4.22–5.81)
RBC: 2.88 MIL/uL — ABNORMAL LOW (ref 4.22–5.81)
RDW: 14.6 % (ref 11.5–15.5)
RDW: 14.6 % (ref 11.5–15.5)
WBC: 22 10*3/uL — ABNORMAL HIGH (ref 4.0–10.5)
WBC: 19.4 10*3/uL — ABNORMAL HIGH (ref 4.0–10.5)

## 2013-06-27 LAB — PROTIME-INR
INR: 3.91 — ABNORMAL HIGH (ref 0.00–1.49)
Prothrombin Time: 36.8 seconds — ABNORMAL HIGH (ref 11.6–15.2)

## 2013-06-27 LAB — PREPARE RBC (CROSSMATCH)

## 2013-06-27 LAB — ABO/RH: ABO/RH(D): O POS

## 2013-06-27 LAB — MRSA PCR SCREENING: MRSA by PCR: NEGATIVE

## 2013-06-27 MED ORDER — SODIUM CHLORIDE 0.9 % IV SOLN
INTRAVENOUS | Status: DC
  Administered 2013-06-27 – 2013-06-28 (×2): via INTRAVENOUS
  Administered 2013-06-28: 100 mL/h via INTRAVENOUS
  Administered 2013-06-29: 01:00:00 via INTRAVENOUS

## 2013-06-27 MED ORDER — OXYCODONE HCL 5 MG PO TABS: 5.0000 mg | ORAL_TABLET | ORAL | Status: DC | PRN

## 2013-06-27 MED ORDER — ONDANSETRON HCL 4 MG PO TABS: 4.0000 mg | ORAL_TABLET | Freq: Four times a day (QID) | ORAL | Status: DC | PRN

## 2013-06-27 MED ORDER — SODIUM CHLORIDE 0.9 % IV SOLN
80.0000 mg | Freq: Once | INTRAVENOUS | Status: AC
  Administered 2013-06-27: 80 mg via INTRAVENOUS
  Filled 2013-06-27: qty 80

## 2013-06-27 MED ORDER — SODIUM CHLORIDE 0.9 % IV SOLN: INTRAVENOUS | Status: DC

## 2013-06-27 MED ORDER — MORPHINE SULFATE 2 MG/ML IJ SOLN: 1.0000 mg | INTRAMUSCULAR | Status: DC | PRN

## 2013-06-27 MED ORDER — PANTOPRAZOLE SODIUM 40 MG IV SOLR
40.0000 mg | Freq: Two times a day (BID) | INTRAVENOUS | Status: DC
  Administered 2013-06-27 – 2013-06-28 (×2): 40 mg via INTRAVENOUS
  Filled 2013-06-27 (×4): qty 40

## 2013-06-27 MED ORDER — PANTOPRAZOLE SODIUM 40 MG IV SOLR: 40.0000 mg | Freq: Two times a day (BID) | INTRAVENOUS | Status: DC

## 2013-06-27 MED ORDER — TIOTROPIUM BROMIDE MONOHYDRATE 18 MCG IN CAPS
18.0000 ug | ORAL_CAPSULE | Freq: Every day | RESPIRATORY_TRACT | Status: DC
  Administered 2013-06-28 – 2013-07-01 (×3): 18 ug via RESPIRATORY_TRACT
  Filled 2013-06-27: qty 5

## 2013-06-27 MED ORDER — ACETAMINOPHEN 650 MG RE SUPP: 650.0000 mg | Freq: Four times a day (QID) | RECTAL | Status: DC | PRN

## 2013-06-27 MED ORDER — VITAMIN K1 10 MG/ML IJ SOLN
10.0000 mg | Freq: Once | INTRAVENOUS | Status: AC
  Administered 2013-06-27: 10 mg via INTRAVENOUS
  Filled 2013-06-27: qty 1

## 2013-06-27 MED ORDER — ONDANSETRON HCL 4 MG/2ML IJ SOLN: 4.0000 mg | Freq: Four times a day (QID) | INTRAMUSCULAR | Status: DC | PRN

## 2013-06-27 MED ORDER — ATORVASTATIN CALCIUM 80 MG PO TABS
80.0000 mg | ORAL_TABLET | Freq: Every day | ORAL | Status: DC
  Administered 2013-06-27 – 2013-06-30 (×4): 80 mg via ORAL
  Filled 2013-06-27 (×5): qty 1

## 2013-06-27 MED ORDER — LEVETIRACETAM 500 MG PO TABS
500.0000 mg | ORAL_TABLET | Freq: Two times a day (BID) | ORAL | Status: DC
  Administered 2013-06-27 – 2013-07-01 (×8): 500 mg via ORAL
  Filled 2013-06-27 (×9): qty 1

## 2013-06-27 MED ORDER — SODIUM CHLORIDE 0.9 % IV BOLUS (SEPSIS)
500.0000 mL | Freq: Once | INTRAVENOUS | Status: AC
  Administered 2013-06-27: 500 mL via INTRAVENOUS

## 2013-06-27 MED ORDER — ACETAMINOPHEN 325 MG PO TABS: 650.0000 mg | ORAL_TABLET | Freq: Four times a day (QID) | ORAL | Status: DC | PRN

## 2013-06-27 NOTE — ED Notes (Signed)
Lab now at bedside

## 2013-06-27 NOTE — ED Notes (Signed)
Dr Arthor Captain sts he will consult intensivist regaridn pt , pt updated on same.
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
## 2013-06-27 NOTE — ED Notes (Signed)
Admitting paged due to hr dropping in to 20's

## 2013-06-27 NOTE — ED Notes (Signed)
Report given to [facility] on 2600

## 2013-06-27 NOTE — ED Notes (Signed)
Attempted report to the floor 
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
## 2013-06-27 NOTE — Telephone Encounter (Signed)
Noted - will follow patient's hospital course and reschedule appt if needed.

## 2013-06-27 NOTE — ED Notes (Signed)
Pt here for rectal bleeding per patient occurs when having a bowel movement, pt denies pain or nausea, sts that occurred 1 month ago also, pt reports normal for his blood pressure to be low.

## 2013-06-27 NOTE — Progress Notes (Signed)
Utilization review completed.  P.J. Latravia Southgate,RN,BSN Case Manager [phone]  

## 2013-06-27 NOTE — ED Notes (Signed)
2600 sts uanble to take pt due to condition of periodic 20's for seconds. Dr Arthor Captain sts he will page intensivist to see pt.

## 2013-06-27 NOTE — ED Notes (Signed)
MD steinl aware of trending bp in the 70's. Prbc's ordered
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
## 2013-06-28 ENCOUNTER — Other Ambulatory Visit (HOSPITAL_COMMUNITY): Payer: Medicare Other

## 2013-06-28 ENCOUNTER — Other Ambulatory Visit: Payer: BC Managed Care – PPO

## 2013-06-28 DIAGNOSIS — J449 Chronic obstructive pulmonary disease, unspecified: Secondary | ICD-10-CM

## 2013-06-28 DIAGNOSIS — I517 Cardiomegaly: Secondary | ICD-10-CM

## 2013-06-28 DIAGNOSIS — I2510 Atherosclerotic heart disease of native coronary artery without angina pectoris: Secondary | ICD-10-CM

## 2013-06-28 DIAGNOSIS — D649 Anemia, unspecified: Secondary | ICD-10-CM

## 2013-06-28 HISTORY — DX: Anemia, unspecified: D64.9

## 2013-06-28 LAB — PREPARE FRESH FROZEN PLASMA
Unit division: 0
Unit division: 0

## 2013-06-28 LAB — PROTIME-INR
INR: 1.36 (ref 0.00–1.49)
Prothrombin Time: 16.4 seconds — ABNORMAL HIGH (ref 11.6–15.2)

## 2013-06-28 LAB — CBC
HCT: 24.3 % — ABNORMAL LOW (ref 39.0–52.0)
HCT: 28.7 % — ABNORMAL LOW (ref 39.0–52.0)
Hemoglobin: 7.8 g/dL — ABNORMAL LOW (ref 13.0–17.0)
Hemoglobin: 9.3 g/dL — ABNORMAL LOW (ref 13.0–17.0)
MCH: 28.9 pg (ref 26.0–34.0)
MCH: 29.2 pg (ref 26.0–34.0)
MCHC: 32.1 g/dL (ref 30.0–36.0)
MCHC: 32.4 g/dL (ref 30.0–36.0)
MCV: 90 fL (ref 78.0–100.0)
MCV: 90.3 fL (ref 78.0–100.0)
Platelets: 302 10*3/uL (ref 150–400)
Platelets: 281 10*3/uL (ref 150–400)
RBC: 2.7 MIL/uL — ABNORMAL LOW (ref 4.22–5.81)
RBC: 3.18 MIL/uL — ABNORMAL LOW (ref 4.22–5.81)
RDW: 14.7 % (ref 11.5–15.5)
RDW: 14.6 % (ref 11.5–15.5)
WBC: 16.5 10*3/uL — ABNORMAL HIGH (ref 4.0–10.5)
WBC: 16.1 10*3/uL — ABNORMAL HIGH (ref 4.0–10.5)

## 2013-06-28 LAB — HEMOGLOBIN AND HEMATOCRIT, BLOOD
HCT: 25.8 % — ABNORMAL LOW (ref 39.0–52.0)
Hemoglobin: 8.2 g/dL — ABNORMAL LOW (ref 13.0–17.0)

## 2013-06-28 NOTE — Progress Notes (Signed)
[facility] Progress Note  Subjective: 1 bloody stool this morning no pain and weakness  Objective: Vital signs in last 24 hours: Temp:  [97.8 F (36.6 C)-98.4 F (36.9 C)] 98.1 F (36.7 C) (07/01 0730) Pulse Rate:  [75-99] 83 (07/01 0730) Resp:  [13-21] 13 (07/01 0730) BP: (73-103)/(42-89) 101/56 mmHg (07/01 0730) SpO2:  [92 %-99 %] 97 % (07/01 0730) Weight:  [89.3 kg (196 lb 13.9 oz)-89.5 kg (197 lb 5 oz)] 89.5 kg (197 lb 5 oz) (07/01 1610) Weight change:    PE: Somewhat pale abdomen soft nontender  Lab Results: Results for orders placed during the hospital encounter of 06/27/13 (from the past 24 hour(s))  TYPE AND SCREEN     Status: None   Collection Time    06/27/13  9:05 AM      Result Value Range   ABO/RH(D) O POS     Antibody Screen NEG     Sample Expiration 06/30/2013     Unit Number [id number]     Blood Component Type RED CELLS,LR     Unit division 00     Status of Unit ALLOCATED     Transfusion Status OK TO TRANSFUSE     Crossmatch Result Compatible     Unit Number [id number]     Blood Component Type RED CELLS,LR     Unit division 00     Status of Unit ALLOCATED     Transfusion Status OK TO TRANSFUSE     Crossmatch Result Compatible    PREPARE RBC (CROSSMATCH)     Status: None   Collection Time    06/27/13  9:05 AM      Result Value Range   Order Confirmation ORDER PROCESSED BY BLOOD BANK    ABO/RH     Status: None   Collection Time    06/27/13  9:05 AM      Result Value Range   ABO/RH(D) O POS    PREPARE FRESH FROZEN PLASMA     Status: None   Collection Time    06/27/13  9:55 AM      Result Value Range   Unit Number [id number]     Blood Component Type THAWED PLASMA     Unit division 00     Status of Unit ISSUED     Transfusion Status OK TO TRANSFUSE     Unit Number [id number]     Blood Component Type THAWED PLASMA     Unit division 00     Status of Unit ALLOCATED     Transfusion Status OK TO TRANSFUSE    MRSA PCR  SCREENING     Status: None   Collection Time    06/27/13  5:07 PM      Result Value Range   MRSA by PCR NEGATIVE  NEGATIVE  CBC     Status: Abnormal   Collection Time    06/27/13  5:34 PM      Result Value Range   WBC 19.4 (*) 4.0 - 10.5 K/uL   RBC 2.88 (*) 4.22 - 5.81 MIL/uL   Hemoglobin 8.4 (*) 13.0 - 17.0 g/dL   HCT 66.4 (*) 40.3 - 47.4 %   MCV 90.3  78.0 - 100.0 fL   MCH 29.2  26.0 - 34.0 pg   MCHC 32.3  30.0 - 36.0 g/dL   RDW 25.9  56.3 - 87.5 %   Platelets 297  150 - 400 K/uL  CBC     Status: Abnormal  Collection Time    06/28/13  4:15 AM      Result Value Range   WBC 16.5 (*) 4.0 - 10.5 K/uL   RBC 2.70 (*) 4.22 - 5.81 MIL/uL   Hemoglobin 7.8 (*) 13.0 - 17.0 g/dL   HCT 16.1 (*) 09.6 - 04.5 %   MCV 90.0  78.0 - 100.0 fL   MCH 28.9  26.0 - 34.0 pg   MCHC 32.1  30.0 - 36.0 g/dL   RDW 40.9  81.1 - 91.4 %   Platelets 302  150 - 400 K/uL  PROTIME-INR     Status: Abnormal   Collection Time    06/28/13  4:15 AM      Result Value Range   Prothrombin Time 16.4 (*) 11.6 - 15.2 seconds   INR 1.36  0.00 - 1.49    Studies/Results: No results found.    Assessment: 1. GI bleed likely lower source diverticula noted on PET scan, INR corrected but significant drop in hemoglobin  Plan: 1. Probably needs transfusion. Will leave to cardiology and internal medicine. 2. Currently getting echocardiogram  3. Continue clear liquid diet Would be available to do colonoscopy tomorrow or Thursday if cleared by cardiology.  Takia Runyon C 06/28/2013, 8:34 AM

## 2013-06-28 NOTE — Progress Notes (Signed)
Spoke with pt in regards to nighttime Bipap ordered. Pt does not recall home settings--set up auto titration mode per policy, filled humidification chamber to appropriate level. Pt wishes to call for assistance when ready for bed. RT will assist as needed.

## 2013-06-28 NOTE — Progress Notes (Signed)
  Echocardiogram 2D Echocardiogram has been performed.  Travis Cruz 06/28/2013, 9:54 AM

## 2013-06-28 NOTE — Progress Notes (Signed)
[facility] Progress Note [place] TEAM 1 - Stepdown/ICU TEAM   Travis Cruz ZOX:[id number] DOB: March 01, 1946 DOA: 06/27/2013 PCP: Redmond Baseman, MD  Brief narrative: 67 y.o. male with past medical history of hypertension, DVT/PEand ischemic cardiomyopathy. Patient came into the hospital because of rectal bleeding. Patient stated that he wasn't his usual self will still yesterday when he started to have bright red blood per rectum. Since then he had 2 bloody bowel movements, denies any abdominal pain, denies any hematemesis flatulence or recent weight loss. Denies any NSAID use except for low dose aspirin. Patient is on Coumadin for history of PE and CVA. Initial evaluation in the emergency department showed hemoglobin of 10.1, his hemoglobin was 15.4 about 2 weeks ago. His INR 3.9 and his blood pressure is in the 70s which improved to the 80s after transfusion of 2 units of FFP.patient seen briefly by PCCM and referred the admission to the hospitalists.  Assessment/Plan: Active Problems:  Rectal bleed -GI following -plan colonoscopy when cleared by Cardiology -cont clears  discontinue IV Protonix since no history of peptic ulcer disease and imaging this admission consistent with likely diverticular bleeding   History of PE (pulmonary embolism) -had completed anti-coagulation specific to this problem   History of CVA (cerebral vascular accident) -wife states primary rationale for chronic coumadin   Ischemic cardiomyopathy/ Implantable cardiac defibrillator due to VF arrest -cardiology following -CHF compensated/euvolemic -ECHO done this admit-results pending    Solitary pulmonary nodule concerning for Stage I malignancy -seen on PET -needs stereotactic biopsy and radiotherapy but will likely need ICD moved to contralateral side -followed as OP by Tyrone Sage -also needs endobronchial navigation bronchoscopy -home O2 dependent since CVA -needs pre op cardiac  clearance-poor surgical candidate due to moderately severe deconditionig and poor exercise tolerance    Hypotension -BP soft and suspect anemia contributing -home ACE I, Aldactone, Coreg and Lasix are on hold    Anemia associated with acute blood loss -hgb drifting down to but remains > 8.0 -follow   DVT prophylaxis: SCDs Code Status: Full Family Communication: Patient and wife at bedside Disposition Plan: Remain in step down Isolation: None Nutritional Status: Stable without evidence of protein calorie malnutrition although need to watch closely if remains on clear liquid diet for an extended period of time  Consultants: Gastroenterology Cardiology  Procedures: 2-D echocardiogram results pending  Antibiotics: None  HPI/Subjective: Patient is alert. Denies abdominal pain. In the room color stool x1 this morning. No chest pain or shortness of   Objective: Blood pressure 92/55, pulse 76, temperature 97.9 F (36.6 C), temperature source Oral, resp. rate 18, height 5\' 9"  (1.753 m), weight 89.5 kg (197 lb 5 oz), SpO2 96.00%.  Intake/Output Summary (Last 24 hours) at 06/28/13 1339 Last data filed at 06/28/13 1155  Gross per 24 hour  Intake   3190 ml  Output   1625 ml  Net   1565 ml     Exam: General: No acute respiratory distress Lungs: Clear to auscultation bilaterally without wheezes or crackles, RA Cardiovascular: Regular rate and rhythm without murmur gallop or rub normal S1 and S2, no peripheral edema or JVD Abdomen: Nontender, nondistended, soft, bowel sounds positive, no rebound, no ascites, no appreciable mass Musculoskeletal: No significant cyanosis, clubbing of bilateral lower extremities Neurological: Alert and oriented x 3, moves all extremities x 4 without focal neurological deficits, CN 2-12 intact  Scheduled Meds: Scheduled Meds: . atorvastatin  80 mg Oral QHS  . levETIRAcetam  500 mg Oral BID  .  pantoprazole (PROTONIX) IV  40 mg Intravenous Q12H  .  tiotropium  18 mcg Inhalation Daily   Continuous Infusions: . sodium chloride 100 mL/hr at 06/28/13 1055    **Reviewed in detail by the Attending Physician   Data Reviewed: Basic Metabolic Panel:  Recent Labs Lab 06/27/13 0817  NA 139  K 4.6  CL 104  CO2 25  GLUCOSE 101*  BUN 43*  CREATININE 1.67*  CALCIUM 8.3*   Liver Function Tests:  Recent Labs Lab 06/27/13 0817  AST 15  ALT 11  ALKPHOS 118*  BILITOT 0.2*  PROT 6.2  ALBUMIN 3.0*   No results found for this basename: LIPASE, AMYLASE,  in the last 168 hours No results found for this basename: AMMONIA,  in the last 168 hours CBC:  Recent Labs Lab 06/27/13 0817 06/27/13 1734 06/28/13 0415 06/28/13 1036  WBC 22.0* 19.4* 16.5*  --   HGB 10.1* 8.4* 7.8* 8.2*  HCT 32.2* 26.0* 24.3* 25.8*  MCV 90.2 90.3 90.0  --   PLT 345 297 302  --    Cardiac Enzymes: No results found for this basename: CKTOTAL, CKMB, CKMBINDEX, TROPONINI,  in the last 168 hours BNP (last 3 results) No results found for this basename: PROBNP,  in the last 8760 hours CBG: No results found for this basename: GLUCAP,  in the last 168 hours  Recent Results (from the past 240 hour(s))  MRSA PCR SCREENING     Status: None   Collection Time    06/27/13  5:07 PM      Result Value Range Status   MRSA by PCR NEGATIVE  NEGATIVE Final   Comment:            The GeneXpert MRSA Assay (FDA     approved for NASAL specimens     only), is one component of a     comprehensive MRSA colonization     surveillance program. It is not     intended to diagnose MRSA     infection nor to guide or     monitor treatment for     MRSA infections.     Studies:  Recent x-ray studies have been reviewed in detail by the Attending Physician    Junious Silk, ANP [facility] Office  [phone] Pager [phone]  **If unable to reach the above provider after paging please contact the Flow Manager @ [phone]  On-Call/Text Page:      [email or web address]       password [email or web address]  If 7PM-7AM, please contact night-coverage [email or web address] Password [email or web address] 06/28/2013, 1:39 PM   LOS: 1 day   I have examined the patient, reviewed the chart and modified the above note which I agree with.   Kashira Behunin,MD [phone] 06/28/2013, 2:41 PM

## 2013-06-28 NOTE — Progress Notes (Signed)
SUBJECTIVE:  No chest pain, no SOB, no dizziness or orthostatic symptoms, no palpitations. No more rectal bleeding overnight.   PHYSICAL EXAM Filed Vitals:   06/28/13 0200 06/28/13 0400 06/28/13 0600 06/28/13 0613  BP: 97/59 100/53 90/58   Pulse: 94 86 90   Temp:  98.4 F (36.9 C)    TempSrc:  Oral    Resp: 16 14 17    Height:      Weight:  197 lb 5 oz (89.5 kg)  197 lb 5 oz (89.5 kg)  SpO2: 98% 93% 96%    General: Well developed, well nourished, male in no acute distress Head: Eyes PERRLA, No xanthomas.   Normocephalic and atraumatic  Lungs: decreased breath sounds bilaterally with rales, possibly a few crackles Heart: HRRR S1 S2, without MRG. No carotid bruit. No JVD.  Abdomen: Bowel sounds are present, abdomen soft and non-tender without masses or  hernias noted. Msk: Normal strength and tone for age. Extremities: No clubbing, cyanosis or edema.    Skin:  No rashes or lesions noted. Neuro: Alert and oriented X 3. Psych:  Good affect, responds appropriately  LABS:  Results for orders placed during the hospital encounter of 06/27/13 (from the past 24 hour(s))  CBC     Status: Abnormal   Collection Time    06/27/13  8:17 AM      Result Value Range   WBC 22.0 (*) 4.0 - 10.5 K/uL   RBC 3.57 (*) 4.22 - 5.81 MIL/uL   Hemoglobin 10.1 (*) 13.0 - 17.0 g/dL   HCT 40.9 (*) 81.1 - 91.4 %   MCV 90.2  78.0 - 100.0 fL   MCH 28.3  26.0 - 34.0 pg   MCHC 31.4  30.0 - 36.0 g/dL   RDW 78.2  95.6 - 21.3 %   Platelets 345  150 - 400 K/uL  COMPREHENSIVE METABOLIC PANEL     Status: Abnormal   Collection Time    06/27/13  8:17 AM      Result Value Range   Sodium 139  135 - 145 mEq/L   Potassium 4.6  3.5 - 5.1 mEq/L   Chloride 104  96 - 112 mEq/L   CO2 25  19 - 32 mEq/L   Glucose, Bld 101 (*) 70 - 99 mg/dL   BUN 43 (*) 6 - 23 mg/dL   Creatinine, Ser 0.86 (*) 0.50 - 1.35 mg/dL   Calcium 8.3 (*) 8.4 - 10.5 mg/dL   Total Protein 6.2  6.0 - 8.3 g/dL   Albumin 3.0 (*) 3.5 - 5.2 g/dL   AST 15  0 - 37 U/L   ALT 11  0 - 53 U/L   Alkaline Phosphatase 118 (*) 39 - 117 U/L   Total Bilirubin 0.2 (*) 0.3 - 1.2 mg/dL   GFR calc non Af Amer 41 (*) >90 mL/min   GFR calc Af Amer 48 (*) >90 mL/min  PROTIME-INR     Status: Abnormal   Collection Time    06/27/13  8:17 AM      Result Value Range   Prothrombin Time 36.8 (*) 11.6 - 15.2 seconds   INR 3.91 (*) 0.00 - 1.49  TYPE AND SCREEN     Status: None   Collection Time    06/27/13  9:05 AM      Result Value Range   ABO/RH(D) O POS     Antibody Screen NEG     Sample Expiration 06/30/2013     Unit Number [id number]  Blood Component Type RED CELLS,LR     Unit division 00     Status of Unit ALLOCATED     Transfusion Status OK TO TRANSFUSE     Crossmatch Result Compatible     Unit Number [id number]     Blood Component Type RED CELLS,LR     Unit division 00     Status of Unit ALLOCATED     Transfusion Status OK TO TRANSFUSE     Crossmatch Result Compatible    PREPARE RBC (CROSSMATCH)     Status: None   Collection Time    06/27/13  9:05 AM      Result Value Range   Order Confirmation ORDER PROCESSED BY BLOOD BANK    ABO/RH     Status: None   Collection Time    06/27/13  9:05 AM      Result Value Range   ABO/RH(D) O POS    PREPARE FRESH FROZEN PLASMA     Status: None   Collection Time    06/27/13  9:55 AM      Result Value Range   Unit Number [id number]     Blood Component Type THAWED PLASMA     Unit division 00     Status of Unit ISSUED     Transfusion Status OK TO TRANSFUSE     Unit Number [id number]     Blood Component Type THAWED PLASMA     Unit division 00     Status of Unit ALLOCATED     Transfusion Status OK TO TRANSFUSE    MRSA PCR SCREENING     Status: None   Collection Time    06/27/13  5:07 PM      Result Value Range   MRSA by PCR NEGATIVE  NEGATIVE  CBC     Status: Abnormal   Collection Time    06/27/13  5:34 PM      Result Value Range   WBC 19.4 (*) 4.0 - 10.5 K/uL   RBC 2.88  (*) 4.22 - 5.81 MIL/uL   Hemoglobin 8.4 (*) 13.0 - 17.0 g/dL   HCT 25.3 (*) 66.4 - 40.3 %   MCV 90.3  78.0 - 100.0 fL   MCH 29.2  26.0 - 34.0 pg   MCHC 32.3  30.0 - 36.0 g/dL   RDW 47.4  25.9 - 56.3 %   Platelets 297  150 - 400 K/uL  CBC     Status: Abnormal   Collection Time    06/28/13  4:15 AM      Result Value Range   WBC 16.5 (*) 4.0 - 10.5 K/uL   RBC 2.70 (*) 4.22 - 5.81 MIL/uL   Hemoglobin 7.8 (*) 13.0 - 17.0 g/dL   HCT 87.5 (*) 64.3 - 32.9 %   MCV 90.0  78.0 - 100.0 fL   MCH 28.9  26.0 - 34.0 pg   MCHC 32.1  30.0 - 36.0 g/dL   RDW 51.8  84.1 - 66.0 %   Platelets 302  150 - 400 K/uL  PROTIME-INR     Status: Abnormal   Collection Time    06/28/13  4:15 AM      Result Value Range   Prothrombin Time 16.4 (*) 11.6 - 15.2 seconds   INR 1.36  0.00 - 1.49    Intake/Output Summary (Last 24 hours) at 06/28/13 0704 Last data filed at 06/28/13 0600  Gross per 24 hour  Intake   2469 ml  Output  1225 ml  Net   1244 ml   EKG:  Telemetry with SR, occ PVCs, some sinus tach  ASSESSMENT AND PLAN: 67 yo male with hx VF arrest, RCA 100%, s/p ICD January 2014. PTA on coumadin for hx DVT/PE > 1 yr ago. Admitted 6/30 for LGIB, coumadin reversed with FFP. Cards seeing for hypotension.    Hypotension - SBP initially 70s but has been 90s overnight. Pt tolerates SBP 90s very well. His BP runs low. BP-lowering meds including ACE/BB/Lasix/Aldactone on hold, echo ordered and pending. No significant arrhythmia seen on telemetry overnight. We will follow.     Hx LVD - EF 35-40% by echo Jan 2014, 55% at cath same admission. 2D pending.  Otherwise, per primary MD. Principal Problem:   Rectal bleed Active Problems:   PE (pulmonary embolism)   CVA (cerebral vascular accident)   Implantable cardiac defibrillator -[employer] - device interrogated yesterday and no significant arrhythmia seen.   Ischemic cardiomyopathy   Solitary pulmonary nodule   Anemia associated with acute blood loss     Theodore Demark 06/28/2013 7:04 AM  History and all data above reviewed.  Patient examined.  I agree with the findings as above.  The patient is having no active cardiac complaints.  His INR was reversed.  Echo is done but results are pending.  He seems to be euvolemicThe patient exam reveals COR:RRR  ,  Lungs: Clear  ,  Abd: Positive bowel sounds, no rebound no guarding, Ext No edema  .  All available labs, radiology testing, previous records reviewed. Agree with documented assessment and plan. Echo pending.  However, seems to be euvolemic on current meds.  No change in medical therapy at this point.   Fayrene Fearing Dorien Mayotte  11:53 AM  06/28/2013

## 2013-06-29 ENCOUNTER — Encounter (HOSPITAL_COMMUNITY): Payer: Self-pay | Admitting: Radiology

## 2013-06-29 ENCOUNTER — Inpatient Hospital Stay (HOSPITAL_COMMUNITY): Payer: BC Managed Care – PPO

## 2013-06-29 DIAGNOSIS — K625 Hemorrhage of anus and rectum: Secondary | ICD-10-CM

## 2013-06-29 LAB — CBC
HCT: 29.4 % — ABNORMAL LOW (ref 39.0–52.0)
Hemoglobin: 9.4 g/dL — ABNORMAL LOW (ref 13.0–17.0)
MCH: 28.6 pg (ref 26.0–34.0)
MCHC: 32 g/dL (ref 30.0–36.0)
MCV: 89.4 fL (ref 78.0–100.0)
Platelets: 293 10*3/uL (ref 150–400)
RBC: 3.29 MIL/uL — ABNORMAL LOW (ref 4.22–5.81)
RDW: 14.7 % (ref 11.5–15.5)
WBC: 15.1 10*3/uL — ABNORMAL HIGH (ref 4.0–10.5)

## 2013-06-29 LAB — PROTIME-INR
INR: 1.2 (ref 0.00–1.49)
Prothrombin Time: 14.9 seconds (ref 11.6–15.2)

## 2013-06-29 MED ORDER — PEG 3350-KCL-NA BICARB-NACL 420 G PO SOLR
4000.0000 mL | Freq: Once | ORAL | Status: AC
  Administered 2013-06-29: 4000 mL via ORAL
  Filled 2013-06-29: qty 4000

## 2013-06-29 MED ORDER — SODIUM CHLORIDE 0.9 % IV SOLN
INTRAVENOUS | Status: DC
  Administered 2013-06-29: 10 mL/h via INTRAVENOUS
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
## 2013-06-29 NOTE — Progress Notes (Signed)
[facility] Progress Note [place] TEAM 1 - Stepdown/ICU TEAM   Travis Cruz WUJ:[id number] DOB: 23-Oct-1946 DOA: 06/27/2013 PCP: Redmond Baseman, MD  Brief narrative: 67 y.o. male with past medical history of hypertension, DVT/PE and ischemic cardiomyopathy. Patient came to the hospital because of GI bleeding. Patient stated that he started to have bright red blood per rectum. Since then he had 2 bloody bowel movements total.  He denied abdominal pain, hematemesis, or recent weight loss. Denied NSAID use except for low dose aspirin. Patient is on Coumadin for history of PE and CVA w patent foramen ovale.   Initial evaluation in the emergency department revealed a hemoglobin of 10.1.  His hemoglobin was 15.4 two weeks prior to this admission. His INR was noted to be 3.9 and his blood pressure was in the 70s.  Assessment/Plan:    Lower GI bleeding -GI following -plan colonoscopy 7/3 -cont clears -discontinue IV Protonix since no history of peptic ulcer disease and clinical picture consistent with lower GI source -Warfarin was reversed with Vitamin K and now INR is subtherapeutic   History of PE (pulmonary embolism) -had completed anti-coagulation specific to this problem   History of CVA complicated by seizure -PFO per notes from [facility] (2012) - this is the indication for ongoing Coumadin therapy -MRI at that time revealed remote infarct and tiny acute right frontal CVA -followed by Neurologist Rolene Arbour [phone]) -cont Keppra   Ischemic cardiomyopathy / Implantable cardiac defibrillator due to VF arrest -Cardiology following -CHF compensated/euvolemic -ECHO results noted below    Solitary pulmonary nodule concerning for Stage I malignancy -seen on PET -needs endobronchial navigation bronchoscopy and probable radiotherapy but will likely need ICD moved to contralateral side -see recommendations as per Dr. Tyrone Sage, who has seen the patient in the outpatient  setting  COPD/OSA -followed by Dr Sherene Sires as OP -utilizes nocturnal CPAP at home -on Spiriva at home -well compensated at present    Hypotension -BP soft - suspect anemia contributing -home ACE I, Aldactone, Coreg and Lasix are on hold    Anemia associated with acute blood loss -hgb drifting down but remains > 8.0 -follow   DVT prophylaxis: SCDs Code Status: Full Family Communication: Patient and wife at bedside Disposition Plan: Remain in step down  Consultants: Gastroenterology Cardiology TCTS  Procedures: 2-D echocardiogram  Left ventricle: The cavity size was mildly dilated. Wall thickness was normal. Systolic function was normal. The estimated ejection fraction was in the range of 60% to 65%. Regional wall motion abnormalities cannot be excluded. Doppler parameters are consistent with abnormal left ventricular relaxation (grade 1 diastolic dysfunction).   Antibiotics: None  HPI/Subjective: Patient is alert. No abdominal pain. Stool now more dark and no further red stool.  Objective: Blood pressure 109/76, pulse 65, temperature 98.2 F (36.8 C), temperature source Oral, resp. rate 11, height 5\' 9"  (1.753 m), weight 89.1 kg (196 lb 6.9 oz), SpO2 100.00%.  Intake/Output Summary (Last 24 hours) at 06/29/13 1637 Last data filed at 06/29/13 1100  Gross per 24 hour  Intake   1730 ml  Output   2250 ml  Net   -520 ml   Exam: General: No acute respiratory distress Lungs: Clear to auscultation bilaterally without wheezes or crackles, RA Cardiovascular: Regular rate (occasional PVC) and rhythm without murmur gallop or rub normal S1 and S2, no peripheral edema or JVD Abdomen: Nontender, nondistended, soft, bowel sounds positive, no rebound, no ascites, no appreciable mass Musculoskeletal: No significant cyanosis, clubbing of bilateral lower extremities  Neurological: Alert and oriented x 3, moves all extremities x 4 without focal neurological deficits, CN 2-12  intact  Scheduled Meds: Scheduled Meds: . atorvastatin  80 mg Oral QHS  . levETIRAcetam  500 mg Oral BID  . tiotropium  18 mcg Inhalation Daily   Data Reviewed: Basic Metabolic Panel:  Recent Labs Lab 06/27/13 0817  NA 139  K 4.6  CL 104  CO2 25  GLUCOSE 101*  BUN 43*  CREATININE 1.67*  CALCIUM 8.3*   Liver Function Tests:  Recent Labs Lab 06/27/13 0817  AST 15  ALT 11  ALKPHOS 118*  BILITOT 0.2*  PROT 6.2  ALBUMIN 3.0*   CBC:  Recent Labs Lab 06/27/13 0817 06/27/13 1734 06/28/13 0415 06/28/13 1036 06/28/13 1700 06/29/13 0417  WBC 22.0* 19.4* 16.5*  --  16.1* 15.1*  HGB 10.1* 8.4* 7.8* 8.2* 9.3* 9.4*  HCT 32.2* 26.0* 24.3* 25.8* 28.7* 29.4*  MCV 90.2 90.3 90.0  --  90.3 89.4  PLT 345 297 302  --  281 293    Recent Results (from the past 240 hour(s))  MRSA PCR SCREENING     Status: None   Collection Time    06/27/13  5:07 PM      Result Value Range Status   MRSA by PCR NEGATIVE  NEGATIVE Final   Comment:            The GeneXpert MRSA Assay (FDA     approved for NASAL specimens     only), is one component of a     comprehensive MRSA colonization     surveillance program. It is not     intended to diagnose MRSA     infection nor to guide or     monitor treatment for     MRSA infections.     Studies:  Recent x-ray studies have been reviewed in detail by the Attending Physician  Junious Silk, ANP [facility] Office  [phone] Pager [phone]  **If unable to reach the above provider after paging please contact the Flow Manager @ [phone]  On-Call/Text Page:      [email or web address]      password [email or web address]  If 7PM-7AM, please contact night-coverage [email or web address] Password [facility] 06/29/2013, 4:37 PM   LOS: 2 days   I have personally examined this patient and reviewed the entire database. I have reviewed the above note, made any necessary editorial changes, and agree with its content.  Lonia Blood, MD [facility]

## 2013-06-29 NOTE — Progress Notes (Signed)
[facility] Progress Note  Subjective: 1 bloody stool last night none since  Objective: Vital signs in last 24 hours: Temp:  [97.6 F (36.4 C)-98.7 F (37.1 C)] 97.9 F (36.6 C) (07/02 0751) Pulse Rate:  [63-80] 63 (07/02 0751) Resp:  [12-20] 12 (07/02 0751) BP: (89-109)/(54-66) 103/61 mmHg (07/02 0751) SpO2:  [92 %-96 %] 95 % (07/02 0751) Weight:  [89.1 kg (196 lb 6.9 oz)] 89.1 kg (196 lb 6.9 oz) (07/02 0500) Weight change: -0.2 kg (-7.1 oz)   PE: Abdomen soft  Lab Results: Results for orders placed during the hospital encounter of 06/27/13 (from the past 24 hour(s))  HEMOGLOBIN AND HEMATOCRIT, BLOOD     Status: Abnormal   Collection Time    06/28/13 10:36 AM      Result Value Range   Hemoglobin 8.2 (*) 13.0 - 17.0 g/dL   HCT 16.1 (*) 09.6 - 04.5 %  CBC     Status: Abnormal   Collection Time    06/28/13  5:00 PM      Result Value Range   WBC 16.1 (*) 4.0 - 10.5 K/uL   RBC 3.18 (*) 4.22 - 5.81 MIL/uL   Hemoglobin 9.3 (*) 13.0 - 17.0 g/dL   HCT 40.9 (*) 81.1 - 91.4 %   MCV 90.3  78.0 - 100.0 fL   MCH 29.2  26.0 - 34.0 pg   MCHC 32.4  30.0 - 36.0 g/dL   RDW 78.2  95.6 - 21.3 %   Platelets 281  150 - 400 K/uL  CBC     Status: Abnormal   Collection Time    06/29/13  4:17 AM      Result Value Range   WBC 15.1 (*) 4.0 - 10.5 K/uL   RBC 3.29 (*) 4.22 - 5.81 MIL/uL   Hemoglobin 9.4 (*) 13.0 - 17.0 g/dL   HCT 08.6 (*) 57.8 - 46.9 %   MCV 89.4  78.0 - 100.0 fL   MCH 28.6  26.0 - 34.0 pg   MCHC 32.0  30.0 - 36.0 g/dL   RDW 62.9  52.8 - 41.3 %   Platelets 293  150 - 400 K/uL  PROTIME-INR     Status: None   Collection Time    06/29/13  4:17 AM      Result Value Range   Prothrombin Time 14.9  11.6 - 15.2 seconds   INR 1.20  0.00 - 1.49    Studies/Results: No results found.    Assessment: Rectal bleeding on Coumadin with no previous colonoscopy  Plan: Discussed with cardiology. No further workup planned immediately. We'll proceed with colonoscopy  tomorrow.    Siren Porrata C 06/29/2013, 8:31 AM

## 2013-06-29 NOTE — Progress Notes (Signed)
   SUBJECTIVE:  The patient has had no chest pain or SOB.     PHYSICAL EXAM Filed Vitals:   06/28/13 2151 06/29/13 0000 06/29/13 0400 06/29/13 0500  BP:  108/62 99/62   Pulse: 70 74 76   Temp:  98.7 F (37.1 C) 97.7 F (36.5 C)   TempSrc:  Axillary Oral   Resp: 20 17 18    Height:      Weight:    196 lb 6.9 oz (89.1 kg)  SpO2: 95% 92% 95%    General:  No distress Lungs:  Clear Heart:  RRR Abdomen:  Positive bowel sounds, no rebound no guarding Extremities:  No edema  LABS:  Results for orders placed during the hospital encounter of 06/27/13 (from the past 24 hour(s))  HEMOGLOBIN AND HEMATOCRIT, BLOOD     Status: Abnormal   Collection Time    06/28/13 10:36 AM      Result Value Range   Hemoglobin 8.2 (*) 13.0 - 17.0 g/dL   HCT 40.9 (*) 81.1 - 91.4 %  CBC     Status: Abnormal   Collection Time    06/28/13  5:00 PM      Result Value Range   WBC 16.1 (*) 4.0 - 10.5 K/uL   RBC 3.18 (*) 4.22 - 5.81 MIL/uL   Hemoglobin 9.3 (*) 13.0 - 17.0 g/dL   HCT 78.2 (*) 95.6 - 21.3 %   MCV 90.3  78.0 - 100.0 fL   MCH 29.2  26.0 - 34.0 pg   MCHC 32.4  30.0 - 36.0 g/dL   RDW 08.6  57.8 - 46.9 %   Platelets 281  150 - 400 K/uL  CBC     Status: Abnormal   Collection Time    06/29/13  4:17 AM      Result Value Range   WBC 15.1 (*) 4.0 - 10.5 K/uL   RBC 3.29 (*) 4.22 - 5.81 MIL/uL   Hemoglobin 9.4 (*) 13.0 - 17.0 g/dL   HCT 62.9 (*) 52.8 - 41.3 %   MCV 89.4  78.0 - 100.0 fL   MCH 28.6  26.0 - 34.0 pg   MCHC 32.0  30.0 - 36.0 g/dL   RDW 24.4  01.0 - 27.2 %   Platelets 293  150 - 400 K/uL  PROTIME-INR     Status: None   Collection Time    06/29/13  4:17 AM      Result Value Range   Prothrombin Time 14.9  11.6 - 15.2 seconds   INR 1.20  0.00 - 1.49    Intake/Output Summary (Last 24 hours) at 06/29/13 0651 Last data filed at 06/29/13 0600  Gross per 24 hour  Intake   5110 ml  Output   2725 ml  Net   2385 ml    ASSESSMENT AND PLAN:  Rectal bleed/ Anemia associated with  acute blood loss:  INR reversed with FFP.  OK for further GI workup including colonoscopy as needed.   CAD:  No active evidence of ischemia.  EF is 65%.  (suboptimal images on echo).  Euvolemic by exam.     Hypotension:  Continuing to hold his hypertension meds for now and we can begin to slowly restart these as his BP allows.       Rollene Rotunda 06/29/2013 6:51 AM

## 2013-06-30 ENCOUNTER — Encounter (HOSPITAL_COMMUNITY)
Admission: EM | Disposition: A | Discharge status: Home / Self Care | Payer: Self-pay | Source: Home / Self Care | Attending: Internal Medicine

## 2013-06-30 ENCOUNTER — Encounter (HOSPITAL_COMMUNITY): Payer: Self-pay | Admitting: *Deleted

## 2013-06-30 HISTORY — PX: COLONOSCOPY: SHX5424

## 2013-06-30 LAB — TYPE AND SCREEN
ABO/RH(D): O POS
Antibody Screen: NEGATIVE
Unit division: 0
Unit division: 0

## 2013-06-30 LAB — CBC
HCT: 31 % — ABNORMAL LOW (ref 39.0–52.0)
Hemoglobin: 9.9 g/dL — ABNORMAL LOW (ref 13.0–17.0)
MCH: 28.7 pg (ref 26.0–34.0)
MCHC: 31.9 g/dL (ref 30.0–36.0)
MCV: 89.9 fL (ref 78.0–100.0)
Platelets: 297 10*3/uL (ref 150–400)
RBC: 3.45 MIL/uL — ABNORMAL LOW (ref 4.22–5.81)
RDW: 14.6 % (ref 11.5–15.5)
WBC: 14 10*3/uL — ABNORMAL HIGH (ref 4.0–10.5)

## 2013-06-30 LAB — BASIC METABOLIC PANEL
BUN: 6 mg/dL (ref 6–23)
CO2: 22 mEq/L (ref 19–32)
Calcium: 8.7 mg/dL (ref 8.4–10.5)
Chloride: 106 mEq/L (ref 96–112)
Creatinine, Ser: 0.87 mg/dL (ref 0.50–1.35)
GFR calc Af Amer: >90 mL/min (ref >90)
GFR calc non Af Amer: 89 mL/min — ABNORMAL LOW (ref >90)
Glucose, Bld: 84 mg/dL (ref 70–99)
Potassium: 4 mEq/L (ref 3.5–5.1)
Sodium: 137 mEq/L (ref 135–145)

## 2013-06-30 LAB — HEPARIN LEVEL (UNFRACTIONATED): Heparin Unfractionated: 0.66 IU/mL (ref 0.30–0.70)

## 2013-06-30 LAB — PROTIME-INR
INR: 1.13 (ref 0.00–1.49)
Prothrombin Time: 14.3 seconds (ref 11.6–15.2)

## 2013-06-30 SURGERY — COLONOSCOPY
Anesthesia: Moderate Sedation

## 2013-06-30 MED ORDER — MIDAZOLAM HCL 5 MG/ML IJ SOLN
INTRAMUSCULAR | Status: AC
  Filled 2013-06-30: qty 3

## 2013-06-30 MED ORDER — DIPHENHYDRAMINE HCL 50 MG/ML IJ SOLN
INTRAMUSCULAR | Status: AC
  Filled 2013-06-30: qty 1

## 2013-06-30 MED ORDER — FENTANYL CITRATE 0.05 MG/ML IJ SOLN
INTRAMUSCULAR | Status: AC
  Filled 2013-06-30: qty 4

## 2013-06-30 MED ORDER — HEPARIN (PORCINE) IN NACL 100-0.45 UNIT/ML-% IJ SOLN
1550.0000 [IU]/h | INTRAMUSCULAR | Status: DC
  Administered 2013-06-30: 1550 [IU]/h via INTRAVENOUS
  Filled 2013-06-30 (×3): qty 250

## 2013-06-30 MED ORDER — FENTANYL CITRATE 0.05 MG/ML IJ SOLN
INTRAMUSCULAR | Status: DC | PRN
  Administered 2013-06-30 (×2): 25 ug via INTRAVENOUS

## 2013-06-30 MED ORDER — MIDAZOLAM HCL 5 MG/5ML IJ SOLN
INTRAMUSCULAR | Status: DC | PRN
  Administered 2013-06-30 (×2): 2 mg via INTRAVENOUS
  Administered 2013-06-30: 1 mg via INTRAVENOUS

## 2013-06-30 NOTE — Progress Notes (Signed)
[facility] Progress Note [place] TEAM 1 - Stepdown/ICU TEAM   Travis Cruz ZOX:[id number] DOB: 08-Sep-1946 DOA: 06/27/2013 PCP: Redmond Baseman, MD  Brief narrative: 67 y.o. male with past medical history of hypertension, DVT/PE and ischemic cardiomyopathy. Patient came to the hospital because of GI bleeding. Patient stated that he started to have bright red blood per rectum. Since then he had 2 bloody bowel movements total.  He denied abdominal pain, hematemesis, or recent weight loss. Denied NSAID use except for low dose aspirin. Patient is on Coumadin for history of PE and CVA w patent foramen ovale.   Initial evaluation in the emergency department revealed a hemoglobin of 10.1.  His hemoglobin was 15.4 two weeks prior to this admission. His INR was noted to be 3.9 and his blood pressure was in the 70s.  Assessment/Plan:    Lower GI bleeding/colonic polyp -GI following -Colonoscopy 7/3 revealed large polyp-path pending -diet advance per GI -discontinue IV Protonix since no history of peptic ulcer disease and clinical picture consistent with lower GI source -Warfarin was reversed with Vitamin K and now INR is sub therapeutic-GI has OK'd short acting anti coagulation that is reversible   History of PE (pulmonary embolism) -had completed anti-coagulation specific to this problem   History of CVA complicated by seizure -PFO per notes from [facility] (2012) - this is the indication for ongoing Coumadin therapy -begin Heparin per Pharmacy until OK per GI to resume Coumadin -MRI at that time revealed remote infarct and tiny acute right frontal CVA -followed by Neurologist Rolene Arbour [phone]) -cont Keppra   Ischemic cardiomyopathy / Implantable cardiac defibrillator due to VF arrest -Cardiology following -CHF compensated/euvolemic -ECHO results noted below    Solitary pulmonary nodule concerning for Stage I malignancy -seen on PET -needs endobronchial navigation  bronchoscopy and probable radiotherapy but will likely need ICD moved to contralateral side -see recommendations as per Dr. Tyrone Sage, who has seen the patient in the outpatient setting -would like to hold coumadin at discharge in anticipation of biopsy after discharge- have asked case manager to determine co pay for Lovenox with at least one weeks worth of doses   COPD/OSA -followed by Dr Sherene Sires as OP -utilizes nocturnal CPAP at home -on Spiriva at home -well compensated at present    Hypotension -BP soft - suspect anemia contributing -home ACE I, Aldactone, Coreg and Lasix are on hold    Anemia associated with acute blood loss -hgb stable down but remains > 8.0 -follow closely with resumption of anti coagulation   DVT prophylaxis: SCDs Code Status: Full Family Communication: Patient and wife at bedside Disposition Plan: Remain in step down  Consultants: Gastroenterology Cardiology TCTS  Procedures: 2-D echocardiogram  Left ventricle: The cavity size was mildly dilated. Wall thickness was normal. Systolic function was normal. The estimated ejection fraction was in the range of 60% to 65%. Regional wall motion abnormalities cannot be excluded. Doppler parameters are consistent with abnormal left ventricular relaxation (grade 1 diastolic dysfunction).  Colonoscopy ENDOSCOPIC IMPRESSION:moderate to large ascending colon polyp,  snared and Endo Clip. Small to medium-sized sigmoid polyp snared.  Few descending sigmoid and descending diverticuli.   Antibiotics: None  HPI/Subjective: Patient is alert. Eager to discharge home. Have used Lovenox in past.  Objective: Blood pressure 110/59, pulse 61, temperature 97.9 F (36.6 C), temperature source Oral, resp. rate 17, height 5\' 9"  (1.753 m), weight 87.2 kg (192 lb 3.9 oz), SpO2 100.00%.  Intake/Output Summary (Last 24 hours) at 06/30/13 1143 Last data  filed at 06/30/13 1055  Gross per 24 hour  Intake    440 ml  Output    1150 ml  Net   -710 ml   Exam: General: No acute respiratory distress Lungs: Clear to auscultation bilaterally without wheezes or crackles, RA Cardiovascular: Regular rate (occasional PVC) and rhythm without murmur gallop or rub normal S1 and S2, no peripheral edema or JVD Abdomen: Nontender, nondistended, soft, bowel sounds positive, no rebound, no ascites, no appreciable mass Musculoskeletal: No significant cyanosis, clubbing of bilateral lower extremities Neurological: Alert and oriented x 3, moves all extremities x 4 without focal neurological deficits, CN 2-12 intact  Scheduled Meds: Scheduled Meds: . atorvastatin  80 mg Oral QHS  . levETIRAcetam  500 mg Oral BID  . tiotropium  18 mcg Inhalation Daily   Data Reviewed: Basic Metabolic Panel:  Recent Labs Lab 06/27/13 0817 06/30/13 0441  NA 139 137  K 4.6 4.0  CL 104 106  CO2 25 22  GLUCOSE 101* 84  BUN 43* 6  CREATININE 1.67* 0.87  CALCIUM 8.3* 8.7   Liver Function Tests:  Recent Labs Lab 06/27/13 0817  AST 15  ALT 11  ALKPHOS 118*  BILITOT 0.2*  PROT 6.2  ALBUMIN 3.0*   CBC:  Recent Labs Lab 06/27/13 1734 06/28/13 0415 06/28/13 1036 06/28/13 1700 06/29/13 0417 06/30/13 0441  WBC 19.4* 16.5*  --  16.1* 15.1* 14.0*  HGB 8.4* 7.8* 8.2* 9.3* 9.4* 9.9*  HCT 26.0* 24.3* 25.8* 28.7* 29.4* 31.0*  MCV 90.3 90.0  --  90.3 89.4 89.9  PLT 297 302  --  281 293 297    Recent Results (from the past 240 hour(s))  MRSA PCR SCREENING     Status: None   Collection Time    06/27/13  5:07 PM      Result Value Range Status   MRSA by PCR NEGATIVE  NEGATIVE Final   Comment:            The GeneXpert MRSA Assay (FDA     approved for NASAL specimens     only), is one component of a     comprehensive MRSA colonization     surveillance program. It is not     intended to diagnose MRSA     infection nor to guide or     monitor treatment for     MRSA infections.     Studies:  Recent x-ray studies have been  reviewed in detail by the Attending Physician  Junious Silk, ANP [facility] Office  [phone] Pager [phone]  **If unable to reach the above provider after paging please contact the Flow Manager @ [phone]  On-Call/Text Page:      [email or web address]      password [email or web address]  If 7PM-7AM, please contact night-coverage [email or web address] Password [facility] 06/30/2013, 11:43 AM   LOS: 3 days    I have examined the patient, reviewed the chart and modified the above note which I agree with.   Yemaya Barnier,MD [phone] 06/30/2013, 1:53 PM

## 2013-06-30 NOTE — Op Note (Signed)
[name] [address] [place], [address]   COLONOSCOPY PROCEDURE REPORT  PATIENT: Travis, Cruz  MR#: [id number] BIRTHDATE: 06-Oct-1946 , 67  yrs. old GENDER: Male ENDOSCOPIST: Dorena Cookey, MD REFERRED BY: PROCEDURE DATE:  06/30/2013 PROCEDURE: ASA CLASS: INDICATIONS:  rectal bleeding MEDICATIONS:    fentanyl 50 mcg, Versed 5 mg.  DESCRIPTION OF PROCEDURE: 2 or 3 descending colon diverticuli. 1.5 cm sessile descending colon polyp removed by snare with slight bleeding. Endo Clip placed with cessation of bleeding. Few descending and sigmoid diverticuli. 8 mm sigmoid polyp snared. Exam otherwise unremarkable.     COMPLICATIONS: None  ENDOSCOPIC IMPRESSION:moderate to large ascending colon polyp, snared and Endo Clip. Small to medium-sized sigmoid polyp snared. Few descending sigmoid and descending diverticuli.  RECOMMENDATIONS:cautiously restart anticoagulation as necessary. Would lean toward short-acting reversible agents at least in the next few days because of the larger polypectomy if possible.    _______________________________ eSigned:  Dorena Cookey, MD 06/30/2013 8:57 AM     PATIENT NAME:  Travis, Cruz MR#: [id number]

## 2013-06-30 NOTE — Progress Notes (Signed)
ANTICOAGULATION CONSULT NOTE - Follow Up Consult  Pharmacy Consult for Heparin Indication: Hx PFO, PE, DVT, CVA    Allergies  Allergen Reactions  . Ativan (Lorazepam) Anxiety and Other (See Comments)    Hyper  [name]    Patient Measurements: Height: 5\' 9"  (175.3 cm) Weight: 192 lb 3.9 oz (87.2 kg) IBW/kg (Calculated) : 70.7 Heparin Dosing Weight: 87.2 kg  Vital Signs: Temp: 97.7 F (36.5 C) (07/03 2023) Temp src: Oral (07/03 2023) BP: 107/65 mmHg (07/03 2023) Pulse Rate: 73 (07/03 2023)  Labs:  Recent Labs  06/28/13 0415  06/28/13 1700 06/29/13 0417 06/30/13 0441 06/30/13 2027  HGB 7.8*  < > 9.3* 9.4* 9.9*  --   HCT 24.3*  < > 28.7* 29.4* 31.0*  --   PLT 302  --  281 293 297  --   LABPROT 16.4*  --   --  14.9 14.3  --   INR 1.36  --   --  1.20 1.13  --   HEPARINUNFRC  --   --   --   --   --  0.66  CREATININE  --   --   --   --  0.87  --   < > = values in this interval not displayed.  Estimated Creatinine Clearance: 92.6 ml/min (by C-G formula based on Cr of 0.87).    Assessment: 67 yom on chronic coumadin therapy presented to the hospital with GIB and elevated INR. Coumadin was reversed with vitamin K. Now s/p colonoscopy and GI ok starting short-acting reversible anticoagulation. Pt is noted to be at an increased bleed risk. Heparin level 0.66 in goal range.  Goal of Therapy:  Heparin level 0.3-0.7 units/ml Monitor platelets by anticoagulation protocol: Yes   Plan:  Continue heparin at 1550 units/hr and recheck level in am.  Misty Yazeed Stillinger 06/30/2013,9:14 PM

## 2013-06-30 NOTE — Progress Notes (Signed)
ANTICOAGULATION CONSULT NOTE - Initial Consult  Pharmacy Consult for heparin Indication: Hx PFO, PE, DVT, CVA  Allergies  Allergen Reactions  . Ativan (Lorazepam) Anxiety and Other (See Comments)    Hyper  [name]    Patient Measurements: Height: 5\' 9"  (175.3 cm) Weight: 192 lb 3.9 oz (87.2 kg) IBW/kg (Calculated) : 70.7 Heparin Dosing Weight: 87.2kg  Vital Signs: Temp: 97.9 F (36.6 C) (07/03 0858) Temp src: Oral (07/03 0858) BP: 110/59 mmHg (07/03 1100) Pulse Rate: 61 (07/03 1100)  Labs:  Recent Labs  06/28/13 0415  06/28/13 1700 06/29/13 0417 06/30/13 0441  HGB 7.8*  < > 9.3* 9.4* 9.9*  HCT 24.3*  < > 28.7* 29.4* 31.0*  PLT 302  --  281 293 297  LABPROT 16.4*  --   --  14.9 14.3  INR 1.36  --   --  1.20 1.13  CREATININE  --   --   --   --  0.87  < > = values in this interval not displayed.  Estimated Creatinine Clearance: 92.6 ml/min (by C-G formula based on Cr of 0.87).   Medical History: Past Medical History  Diagnosis Date  . COPD (chronic obstructive pulmonary disease)     Emphysema. Persistent hypoxia during 01/2013 admission. Uses bipap at night for h/o stroke and seizure per records.  . Hypertension   . Stroke 2011, 2012  . Pulmonary embolism 2009    in setting of prolonged hospitalization; chronic coumadin  . DVT (deep venous thrombosis) 2009    in setting of prolonged hospitalization; ; chronic coumadin  . Patent foramen ovale     refused repair; on chronic coumadin  . CAD (coronary artery disease)     a. Cath 01/31/2013 - severe single vessel CAD of RCA; mild LV dysfunction with appearance of an old inferior MI; otherwise small vessel disease and nonobstructive large vessel disease - treated medically.  . Ventricular fibrillation     a. VF cardiac arrest 12/2012 - unknown etiology, noninvasive EPS without inducible VT. b. s/p single chamber ICD implantation 02/02/2013 ([employer]). c. Hospitalization  complicated by aspiration PNA/ARDS.  . Ischemic cardiomyopathy     a. EF 35-40% by echo 12/2012, EF 55% by cath several days later.  . Systolic CHF     a. EF 35-40% by echo 12/2012, EF 55% by cath several days later.  . Cholelithiasis     a. Seen on prior CT 2014.  . Lung nodule seen on imaging study     a. Suspicious for probable Stage I carcinoma of the left lung by imaging studies, being evaluated by pulm/TCTS in 05/2013.  . Implantable cardiac defibrillator -[employer]   . ARDS (adult respiratory distress syndrome)     a. During admission 1-01/2013 for VF arrest.  . Myocardial infarction   . CHF (congestive heart failure)   . Arthritis     knee  . Rectal bleeding 06/27/2013  . OSA (obstructive sleep apnea)     severe, on nocturnal BiPAP  . Automatic implantable cardioverter-defibrillator in situ   . Seizures 2012    following second stroke   Assessment: 67 yom on chronic coumadin therapy presented to the hospital with GIB and elevated INR. Coumadin was reversed with vitamin K. Now s/p colonoscopy and GI ok starting short-acting reversible anticoagulation. Pt is noted to be at an increased bleed risk.   Goal of Therapy:  Heparin level 0.3-0.7 units/ml Monitor platelets by anticoagulation protocol:  Yes   Plan:  1. NO HEPARIN BOLUS 2. Start heparin gtt at 1550 units/hr (has been therapeutic on 1650 units/hr during previous admissions but was at the upper end of goal range so will start more conservatively) 3. Check an 8 hour heparin level 4. Daily heparin level and CBC 5. F/u restart of coumadin when appropriate  Ayari Liwanag, Drake Leach 06/30/2013,11:59 AM

## 2013-06-30 NOTE — Care Management Note (Signed)
    Page 1 of 1   06/30/2013     3:48:34 PM   CARE MANAGEMENT NOTE 06/30/2013  Patient:  Travis Cruz, Travis Cruz   Account Number:  [id number]  Date Initiated:  06/28/2013  Documentation initiated by:  Alvira Philips Assessment:   67 yr-old male adm with dx of rectal bleed; lives with spouse     DC Planning Services  CM consult      Per UR Regulation:  Reviewed for med. necessity/level of care/duration of stay  Comments:  PCP:  Dr Leodis Sias  Contact:  Kanin Lia, spouse  [phone], [phone]  06/30/13 1458 Verdis Prime RN MSN BSN CCM Pt to be d/c'd on Lovenox.  Pt states he has given himself Lovenox previously and had no problem self-injecting.  Per pharmacy, pt would require 130 mg qd.  Insurance check underway. [facility] in [place] has 150 mg syringes of enoxaparin in stock. TC to spouse, she states she has also given pt injections and they have had to adjust dosage as well. 1545 Copay will be $20 for enoxaparin.

## 2013-06-30 NOTE — Progress Notes (Signed)
Colonoscopy done. There is a fairly large polyp removed with some bleeding. He may be at above average post polypectomy bleeding risk. Would resume anticoagulation as necessary to consider short-acting reversible agents initially. Will followup on pathology in the next couple of days. There is no active bleeding during the procedure

## 2013-07-01 ENCOUNTER — Encounter (HOSPITAL_COMMUNITY): Payer: Self-pay | Admitting: Gastroenterology

## 2013-07-01 LAB — CBC
HCT: 31 % — ABNORMAL LOW (ref 39.0–52.0)
Hemoglobin: 10 g/dL — ABNORMAL LOW (ref 13.0–17.0)
MCH: 29 pg (ref 26.0–34.0)
MCHC: 32.3 g/dL (ref 30.0–36.0)
MCV: 89.9 fL (ref 78.0–100.0)
Platelets: 305 10*3/uL (ref 150–400)
RBC: 3.45 MIL/uL — ABNORMAL LOW (ref 4.22–5.81)
RDW: 14.5 % (ref 11.5–15.5)
WBC: 20.4 10*3/uL — ABNORMAL HIGH (ref 4.0–10.5)

## 2013-07-01 LAB — PROTIME-INR
INR: 1.15 (ref 0.00–1.49)
Prothrombin Time: 14.5 seconds (ref 11.6–15.2)

## 2013-07-01 LAB — HEPARIN LEVEL (UNFRACTIONATED): Heparin Unfractionated: 1.14 IU/mL — ABNORMAL HIGH (ref 0.30–0.70)

## 2013-07-01 MED ORDER — ENOXAPARIN SODIUM 80 MG/0.8ML ~~LOC~~ SOLN
80.0000 mg | Freq: Two times a day (BID) | SUBCUTANEOUS | Status: DC
  Administered 2013-07-01: 80 mg via SUBCUTANEOUS
  Filled 2013-07-01 (×2): qty 0.8

## 2013-07-01 MED ORDER — HEPARIN (PORCINE) IN NACL 100-0.45 UNIT/ML-% IJ SOLN
1250.0000 [IU]/h | INTRAMUSCULAR | Status: DC
  Administered 2013-07-01: 1250 [IU]/h via INTRAVENOUS
  Filled 2013-07-01: qty 250

## 2013-07-01 MED ORDER — ENOXAPARIN SODIUM 80 MG/0.8ML ~~LOC~~ SOLN: 80.0000 mg | Freq: Two times a day (BID) | SUBCUTANEOUS | Status: DC

## 2013-07-01 NOTE — Progress Notes (Signed)
[employer] 8:49 AM  Subjective: Patient without any complaints or signs of bleeding and no problems from his colonoscopy and we discussed the findings with he and his wife  Objective: Vital signs stable afebrile no acute distress abdomen is soft nontender hemoglobin stable  Assessment: Colon polyps and diverticuli and resolved GI bleeding  Plan: Okay with me to go home and we discussed the risks and signs of bleeding and he'll be discharged on Lovenox per the hospital team because he has another up and coming lung procedure and they were instructed to call for the biopsies on Thursday and call sooner if needed  [facility]

## 2013-07-01 NOTE — Progress Notes (Signed)
ANTICOAGULATION CONSULT NOTE - Follow Up Consult  Pharmacy Consult for Heparin --> Lovenox Indication: Hx PFO, PE, DVT, CVA   Allergies  Allergen Reactions  . Ativan (Lorazepam) Anxiety and Other (See Comments)    Hyper  [name]    Patient Measurements: Height: 5\' 9"  (175.3 cm) Weight: 193 lb 2 oz (87.6 kg) IBW/kg (Calculated) : 70.7  Vital Signs: Temp: 97.8 F (36.6 C) (07/04 0759) Temp src: Oral (07/04 0759) BP: 104/60 mmHg (07/04 0759) Pulse Rate: 92 (07/04 0759)  Labs:  Recent Labs  06/29/13 0417 06/30/13 0441 06/30/13 2027 07/01/13 0500  HGB 9.4* 9.9*  --  10.0*  HCT 29.4* 31.0*  --  31.0*  PLT 293 297  --  305  LABPROT 14.9 14.3  --  14.5  INR 1.20 1.13  --  1.15  HEPARINUNFRC  --   --  0.66 1.14*  CREATININE  --  0.87  --   --     Estimated Creatinine Clearance: 92.8 ml/min (by C-G formula based on Cr of 0.87).   Medications:  Scheduled:  . atorvastatin  80 mg Oral QHS  . levETIRAcetam  500 mg Oral BID  . tiotropium  18 mcg Inhalation Daily   Infusions:  . sodium chloride 10 mL/hr (06/29/13 0954)  . heparin 1,250 Units/hr (07/01/13 0820)    Assessment: 67 yo M on chronic Coumadin therapy admitted with GIB in the setting of elevated INR.  Pt is now s/p Vitamin K reversal and colonoscopy.  Plan to change Heparin to Lovenox and discharge home on Lovenox with outpatient follow-up.  Heparin was off early this morning related to surpatherapeutic level.  Heparin was restarted ~ 0820 and then stopped again ~0900 with orders to transition to Lovenox.  Will wait 1 hour to allow for drug clearance before starting Lovenox.  Goal of Therapy:  Anti-Xa level 0.6-1.2 units/ml 4hrs after LMWH dose given Monitor platelets by anticoagulation protocol: Yes   Plan:  Lovenox 80 mg SQ q12h - first dose at 1000.   [employer], Pharm.D., BCPS Clinical Pharmacist Pager [phone] 07/01/2013 9:11 AM

## 2013-07-01 NOTE — Progress Notes (Signed)
Pt and wife stated that they have used lovenox previously and therefore do not need to be reeducated.

## 2013-07-01 NOTE — Progress Notes (Signed)
Pt dc`d home, verbalized understanding of all dc instructions.called [facility] in [place] and confirmed receipt of dc prescriptions medication including lovenox.

## 2013-07-01 NOTE — Discharge Summary (Signed)
Physician Discharge Summary  Travis Cruz MWU:[id number] DOB: [birth year] DOA: 06/27/2013  PCP: Redmond Baseman, MD  Admit date: 06/27/2013 Discharge date: 07/01/2013  Time spent: 45 minutes  Recommendations for Outpatient Follow-up:  1. Follow up on biopsy of colon polyps 2. - resume BP meds and diuretics (too hypotensive right now) 3. Resume ASA in 1 wk 4. Currently on Lovenox- resume Coumadin after Lung biopsy completed  Discharge Diagnoses:  Principal Problem:   Rectal bleed Active Problems:   PE (pulmonary embolism)   CVA (cerebral vascular accident)   Implantable cardiac defibrillator -[employer] Jude   Ischemic cardiomyopathy   Solitary pulmonary nodule   Hypotension   Anemia associated with acute blood loss   Discharge Condition: stable  Diet recommendation: heart healthy  Filed Weights   06/29/13 0500 06/30/13 0508 07/01/13 0500  Weight: 89.1 kg (196 lb 6.9 oz) 87.2 kg (192 lb 3.9 oz) 87.6 kg (193 lb 2 oz)    History of present illness:  Travis Cruz is a 67 y.o. male with past medical history of hypertension, DVT/PEand ischemic cardiomyopathy. Patient came into the hospital because of rectal bleeding. Patient stated that he wasn't his usual self will still yesterday when he started to have bright red blood per rectum. Since then he had 2 bloody bowel movements, denies any abdominal pain, denies any hematemesis flatulence or recent weight loss. Denies any NSAID use except for low dose aspirin. Patient is on Coumadin for history of PE and CVA. Initial evaluation in the emergency department showed hemoglobin of 10.1, his hemoglobin was 15.4 about 2 weeks ago. His INR 3.9 and his blood pressure is in the 70s which improved to the 80s after transfusion of 2 units of FFP.patient seen briefly by PCCM and referred the admission to the hospitalists.   Hospital Course:  Lower GI bleeding/colonic polyp  -GI following  -Colonoscopy 7/3 revealed large polyp-path pending   -diet advance per GI  -Warfarin was reversed with Vitamin K and now INR is sub therapeutic-GI has OK'd short acting anti coagulation that is reversible - he will be discharged home on Lovenox-advised to stop if GI bleed recurs- if no recurrence, can transition back to Coumadin as outpt  History of PE (pulmonary embolism)  -had completed anti-coagulation specific to this problem   History of CVA complicated by seizure  -PFO per notes from [facility] (2012) - this is the indication for ongoing Coumadin therapy  -has been on Heparin since the colonoscopy without any noted re-bleed - will transition to Lovenox today- pt aware that he will need to stop Lovenox and call his PCP if GI bleed recurs.   Ischemic cardiomyopathy / Implantable cardiac defibrillator due to VF arrest  -Cardiology following  -CHF compensated/euvolemic  -ECHO results noted below   Solitary pulmonary nodule concerning for Stage I malignancy  -seen on PET  -needs endobronchial navigation bronchoscopy and probable radiotherapy  -see recommendations as per Dr. Tyrone Sage, who has seen the patient in the outpatient setting  -would like to hold coumadin at discharge in anticipation of biopsy after discharge- Pt will d/c on Lovenox which can be transitioned back to Coumadin once Biopsy is completed.   COPD/OSA  -followed by Dr Sherene Sires as OP  -utilizes nocturnal CPAP at home  -on Spiriva at home  -well compensated at present   Hypotension  -BP soft - suspect anemia contributing  -home ACE I, Aldactone, Coreg and Lasix are on hold  - Pt advised to resume above meds one by one  at home if BP starts to rise > 120/80.   Anemia associated with acute blood loss  -hgb stable down but remains > 8.0  -follow closely with resumption of anti coagulation   Procedures: Colonoscopy 7/3 - ENDOSCOPIC IMPRESSION:moderate to large ascending colon polyp,  snared and Endo Clip. Small to medium-sized sigmoid polyp snared.  Few descending  sigmoid and descending diverticuli.   Consultations:  GI  Discharge Exam: Filed Vitals:   07/01/13 0945 07/01/13 1000 07/01/13 1015 07/01/13 1030  BP:      Pulse: 123 96 95 92  Temp:      TempSrc:      Resp: 24 19 16 20   Height:      Weight:      SpO2: 100% 93% 99% 95%    General: AAO x 3 Cardiovascular: RRR, no murmurs Respiratory: CTA b/l   Discharge Instructions      Discharge Orders   Future Appointments Provider Department Dept Phone   08/10/2013 10:30 AM Rollene Rotunda, MD [facility] at [place] [phone]   08/19/2013 8:40 AM Ileana Ladd, MD [facility] [phone]   09/26/2013 9:35 AM [employer] [facility] Main Office [place]) [phone]   Future Orders Complete By Expires     Diet - low sodium heart healthy  As directed     Discharge instructions  As directed     Comments:      Check your BP daily- as it starts rising above > 120/80 restart your BP medication one by one. Start with Coreg and then Aldactone, Lasix and then Lisinopril. Resume Aspirin in 1 week as long as no bleeding occurs.    Increase activity slowly  As directed         Medication List    STOP taking these medications       aspirin 81 MG chewable tablet     carvedilol 6.25 MG tablet  Commonly known as:  COREG     furosemide 40 MG tablet  Commonly known as:  LASIX     lisinopril 5 MG tablet  Commonly known as:  PRINIVIL,ZESTRIL     spironolactone 12.5 mg Tabs  Commonly known as:  ALDACTONE     warfarin 2 MG tablet  Commonly known as:  COUMADIN      TAKE these medications       atorvastatin 80 MG tablet  Commonly known as:  LIPITOR  Take 1 tablet (80 mg total) by mouth at bedtime.     enoxaparin 80 MG/0.8ML injection  Commonly known as:  LOVENOX  Inject 0.8 mLs (80 mg total) into the skin every 12 (twelve) hours.     levETIRAcetam 500 MG tablet  Commonly known as:  KEPPRA  Take 500 mg by mouth 2 (two) times  daily.     tiotropium 18 MCG inhalation capsule  Commonly known as:  SPIRIVA HANDIHALER  INSERT CAPSULE INTO HANDIHALER AND USE 1 PUFF DAILY       Allergies  Allergen Reactions  . Ativan (Lorazepam) Anxiety and Other (See Comments)    Hyper  [name]   Follow-up Information   Call Barrie Folk, MD. (for results)    Contact information:   [address]                         [place] [address] [phone]  The results of significant diagnostics from this hospitalization (including imaging, microbiology, ancillary and laboratory) are listed below for reference.    Significant Diagnostic Studies: Ct Head W Wo Contrast  06/23/2013   *RADIOLOGY REPORT*  Clinical Data: Syncopal episode.  History of lung cancer and previous CVA.  CT HEAD WITHOUT AND WITH CONTRAST  Technique:  Contiguous axial images were obtained from the base of the skull through the vertex without and with intravenous contrast.  Contrast: 75mL OMNIPAQUE IOHEXOL 300 MG/ML  SOLN  Comparison: 01/22/2013.  Findings: The ventricles are in the midline without mass effect or shift.  There are stable in size and configuration.  No extra-axial fluid collections are identified.  Stable mild cerebral atrophy.  There are multiple remote infarctions but no findings for intracranial metastatic disease or new infarct.  The major vascular structures appear normal including the dural venous sinuses.  No findings for osseous metastatic disease.  Scattered ethmoid sinus disease is noted.  IMPRESSION:  1.  No CT findings to suggest intracranial metastatic disease. 2.  Remote cerebral and cerebellar infarcts.   Original Report Authenticated By: Rudie Meyer, M.D.   Ct Super D Chest Wo Contrast  06/29/2013   *RADIOLOGY REPORT*  Clinical Data:  Left upper lobe nodule.  CT CHEST WITHOUT CONTRAST  Technique:  Multidetector CT imaging of the chest was performed using thin  slice collimation for electromagnetic bronchoscopy planning purposes, without intravenous contrast.  Comparison:  PET 06/01/2013 and CT chest 05/25/2013.  Findings:  No pathologically enlarged mediastinal or axillary lymph nodes.  Hilar regions are difficult to definitively evaluate without IV contrast.  Coronary artery calcification.  Heart size normal.  No pericardial effusion.  Emphysema.  A spiculated nodule in the left upper lobe measures 1.2 x 1.5 cm (previously 1.4 x 1.8 cm).  There may be mild basilar predominant superimposed subpleural reticulation.  No pleural fluid.  Airway is unremarkable.  Incidental imaging of the upper abdomen shows no acute findings. Small stones layer in the gallbladder.  Tiny stone in the left kidney.  No worrisome lytic or sclerotic lesions.  Degenerative changes are seen in the spine. Old left rib fractures. Lower thoracic compression fracture is unchanged.  IMPRESSION:  1.  Spiculated left upper lobe nodule measures slightly smaller than on prior exams but remains worrisome for primary bronchogenic carcinoma. 2.  Emphysema with suspected bibasilar superimposed subpleural fibrosis. 3.  Extensive coronary artery calcification. 4.  Cholelithiasis. 5.  Left nephrolithiasis.   Original Report Authenticated By: Leanna Battles, M.D.    Microbiology: Recent Results (from the past 240 hour(s))  MRSA PCR SCREENING     Status: None   Collection Time    06/27/13  5:07 PM      Result Value Range Status   MRSA by PCR NEGATIVE  NEGATIVE Final   Comment:            The GeneXpert MRSA Assay (FDA     approved for NASAL specimens     only), is one component of a     comprehensive MRSA colonization     surveillance program. It is not     intended to diagnose MRSA     infection nor to guide or     monitor treatment for     MRSA infections.     Labs: Basic Metabolic Panel:  Recent Labs Lab 06/27/13 0817 06/30/13 0441  NA 139 137  K 4.6 4.0  CL 104 106  CO2 25 22   GLUCOSE 101*  84  BUN 43* 6  CREATININE 1.67* 0.87  CALCIUM 8.3* 8.7   Liver Function Tests:  Recent Labs Lab 06/27/13 0817  AST 15  ALT 11  ALKPHOS 118*  BILITOT 0.2*  PROT 6.2  ALBUMIN 3.0*   No results found for this basename: LIPASE, AMYLASE,  in the last 168 hours No results found for this basename: AMMONIA,  in the last 168 hours CBC:  Recent Labs Lab 06/28/13 0415 06/28/13 1036 06/28/13 1700 06/29/13 0417 06/30/13 0441 07/01/13 0500  WBC 16.5*  --  16.1* 15.1* 14.0* 20.4*  HGB 7.8* 8.2* 9.3* 9.4* 9.9* 10.0*  HCT 24.3* 25.8* 28.7* 29.4* 31.0* 31.0*  MCV 90.0  --  90.3 89.4 89.9 89.9  PLT 302  --  281 293 297 305   Cardiac Enzymes: No results found for this basename: CKTOTAL, CKMB, CKMBINDEX, TROPONINI,  in the last 168 hours BNP: BNP (last 3 results) No results found for this basename: PROBNP,  in the last 8760 hours CBG: No results found for this basename: GLUCAP,  in the last 168 hours     Signed:  Dvora Buitron  [facility] 07/01/2013, 12:00 PM

## 2013-07-01 NOTE — Progress Notes (Signed)
ANTICOAGULATION CONSULT NOTE - Follow Up Consult  Pharmacy Consult for heparin  Indication: hx pfo, pe dvt and cva   Allergies  Allergen Reactions  . Ativan (Lorazepam) Anxiety and Other (See Comments)    Hyper  [name]    Patient Measurements: Height: 5\' 9"  (175.3 cm) Weight: 193 lb 2 oz (87.6 kg) IBW/kg (Calculated) : 70.7 Heparin Dosing Weight:   Vital Signs: Temp: 97.6 F (36.4 C) (07/04 0526) Temp src: Oral (07/04 0526) BP: 89/55 mmHg (07/04 0526) Pulse Rate: 70 (07/04 0526)  Labs:  Recent Labs  06/29/13 0417 06/30/13 0441 06/30/13 2027 07/01/13 0500  HGB 9.4* 9.9*  --  10.0*  HCT 29.4* 31.0*  --  31.0*  PLT 293 297  --  305  LABPROT 14.9 14.3  --  14.5  INR 1.20 1.13  --  1.15  HEPARINUNFRC  --   --  0.66 1.14*  CREATININE  --  0.87  --   --     Estimated Creatinine Clearance: 92.8 ml/min (by C-G formula based on Cr of 0.87).   Medications:    Assessment: Heparin level has accumulated this am to be 1.14 IU. No bleeding reported. H/h plt stable.  Goal of Therapy:  Heparin level 0.3-0.7 units/ml Monitor platelets by anticoagulation protocol: Yes   Plan:  Hold heparin x1 hour and restart at 1250 units/h. Recheck HL in 6 hours.   Janice Coffin 07/01/2013,6:45 AM

## 2013-07-05 ENCOUNTER — Other Ambulatory Visit: Payer: Self-pay

## 2013-07-05 DIAGNOSIS — D381 Neoplasm of uncertain behavior of trachea, bronchus and lung: Secondary | ICD-10-CM

## 2013-07-06 ENCOUNTER — Encounter: Payer: Self-pay | Admitting: *Deleted

## 2013-07-06 ENCOUNTER — Telehealth: Payer: Self-pay | Admitting: Pharmacist

## 2013-07-06 ENCOUNTER — Other Ambulatory Visit: Payer: Self-pay | Admitting: Radiology

## 2013-07-06 NOTE — Telephone Encounter (Signed)
Lung biopsy set up for 7/15.  Currently using lovenox BID - scheduled to take last dose in pm of 7/13. Patient has appt with neurologist on Friday 07/08/13.  He is planning to discuss anticoagulation with her - warfarin vs. Xarelto.  Patient's wife to let me know what is decided and we will restart as directed after biopsy.

## 2013-07-11 ENCOUNTER — Encounter: Payer: Self-pay | Admitting: Family Medicine

## 2013-07-11 ENCOUNTER — Encounter (HOSPITAL_COMMUNITY): Payer: Self-pay | Admitting: Emergency Medicine

## 2013-07-11 ENCOUNTER — Telehealth: Payer: Self-pay | Admitting: *Deleted

## 2013-07-11 ENCOUNTER — Telehealth: Payer: Self-pay | Admitting: Pharmacist

## 2013-07-11 ENCOUNTER — Inpatient Hospital Stay (HOSPITAL_COMMUNITY)
Admission: EM | Admit: 2013-07-11 | Discharge: 2013-07-14 | DRG: 582 | Disposition: A | Discharge status: Home / Self Care | Payer: BC Managed Care – PPO | Attending: Internal Medicine | Admitting: Internal Medicine

## 2013-07-11 ENCOUNTER — Ambulatory Visit (INDEPENDENT_AMBULATORY_CARE_PROVIDER_SITE_OTHER): Payer: BC Managed Care – PPO | Admitting: Family Medicine

## 2013-07-11 VITALS — BP 105/75 | HR 91 | Temp 97.4°F | Wt 195.0 lb

## 2013-07-11 DIAGNOSIS — Z8249 Family history of ischemic heart disease and other diseases of the circulatory system: Secondary | ICD-10-CM

## 2013-07-11 DIAGNOSIS — Z86718 Personal history of other venous thrombosis and embolism: Secondary | ICD-10-CM

## 2013-07-11 DIAGNOSIS — I255 Ischemic cardiomyopathy: Secondary | ICD-10-CM

## 2013-07-11 DIAGNOSIS — I82409 Acute embolism and thrombosis of unspecified deep veins of unspecified lower extremity: Secondary | ICD-10-CM

## 2013-07-11 DIAGNOSIS — I2699 Other pulmonary embolism without acute cor pulmonale: Secondary | ICD-10-CM | POA: Diagnosis present

## 2013-07-11 DIAGNOSIS — K5730 Diverticulosis of large intestine without perforation or abscess without bleeding: Secondary | ICD-10-CM | POA: Diagnosis present

## 2013-07-11 DIAGNOSIS — K8020 Calculus of gallbladder without cholecystitis without obstruction: Secondary | ICD-10-CM | POA: Diagnosis present

## 2013-07-11 DIAGNOSIS — Z87891 Personal history of nicotine dependence: Secondary | ICD-10-CM

## 2013-07-11 DIAGNOSIS — IMO0002 Reserved for concepts with insufficient information to code with codable children: Principal | ICD-10-CM | POA: Diagnosis present

## 2013-07-11 DIAGNOSIS — Z801 Family history of malignant neoplasm of trachea, bronchus and lung: Secondary | ICD-10-CM

## 2013-07-11 DIAGNOSIS — Q211 Atrial septal defect: Secondary | ICD-10-CM

## 2013-07-11 DIAGNOSIS — Z7901 Long term (current) use of anticoagulants: Secondary | ICD-10-CM

## 2013-07-11 DIAGNOSIS — Q2111 Secundum atrial septal defect: Secondary | ICD-10-CM

## 2013-07-11 DIAGNOSIS — G40909 Epilepsy, unspecified, not intractable, without status epilepticus: Secondary | ICD-10-CM | POA: Diagnosis present

## 2013-07-11 DIAGNOSIS — Y849 Medical procedure, unspecified as the cause of abnormal reaction of the patient, or of later complication, without mention of misadventure at the time of the procedure: Secondary | ICD-10-CM | POA: Diagnosis present

## 2013-07-11 DIAGNOSIS — R9389 Abnormal findings on diagnostic imaging of other specified body structures: Secondary | ICD-10-CM

## 2013-07-11 DIAGNOSIS — G4733 Obstructive sleep apnea (adult) (pediatric): Secondary | ICD-10-CM | POA: Diagnosis present

## 2013-07-11 DIAGNOSIS — Z86711 Personal history of pulmonary embolism: Secondary | ICD-10-CM

## 2013-07-11 DIAGNOSIS — Z95810 Presence of automatic (implantable) cardiac defibrillator: Secondary | ICD-10-CM

## 2013-07-11 DIAGNOSIS — I252 Old myocardial infarction: Secondary | ICD-10-CM

## 2013-07-11 DIAGNOSIS — I5020 Unspecified systolic (congestive) heart failure: Secondary | ICD-10-CM | POA: Diagnosis present

## 2013-07-11 DIAGNOSIS — J449 Chronic obstructive pulmonary disease, unspecified: Secondary | ICD-10-CM | POA: Diagnosis present

## 2013-07-11 DIAGNOSIS — Z8674 Personal history of sudden cardiac arrest: Secondary | ICD-10-CM

## 2013-07-11 DIAGNOSIS — Z8673 Personal history of transient ischemic attack (TIA), and cerebral infarction without residual deficits: Secondary | ICD-10-CM

## 2013-07-11 DIAGNOSIS — I2510 Atherosclerotic heart disease of native coronary artery without angina pectoris: Secondary | ICD-10-CM | POA: Diagnosis present

## 2013-07-11 DIAGNOSIS — D62 Acute posthemorrhagic anemia: Secondary | ICD-10-CM

## 2013-07-11 DIAGNOSIS — K625 Hemorrhage of anus and rectum: Secondary | ICD-10-CM

## 2013-07-11 DIAGNOSIS — I2589 Other forms of chronic ischemic heart disease: Secondary | ICD-10-CM

## 2013-07-11 DIAGNOSIS — I509 Heart failure, unspecified: Secondary | ICD-10-CM | POA: Diagnosis present

## 2013-07-11 DIAGNOSIS — J438 Other emphysema: Secondary | ICD-10-CM | POA: Diagnosis present

## 2013-07-11 DIAGNOSIS — R911 Solitary pulmonary nodule: Secondary | ICD-10-CM | POA: Diagnosis present

## 2013-07-11 LAB — CBC WITH DIFFERENTIAL/PLATELET
Basophils Absolute: 0.1 10*3/uL (ref 0.0–0.1)
Basophils Relative: 0 % (ref 0–1)
Eosinophils Absolute: 0.5 10*3/uL (ref 0.0–0.7)
Eosinophils Relative: 3 % (ref 0–5)
HCT: 27.4 % — ABNORMAL LOW (ref 39.0–52.0)
Hemoglobin: 8.7 g/dL — ABNORMAL LOW (ref 13.0–17.0)
Lymphocytes Relative: 35 % (ref 12–46)
Lymphs Abs: 5.6 10*3/uL — ABNORMAL HIGH (ref 0.7–4.0)
MCH: 28.1 pg (ref 26.0–34.0)
MCHC: 31.8 g/dL (ref 30.0–36.0)
MCV: 88.4 fL (ref 78.0–100.0)
Monocytes Absolute: 1.8 10*3/uL — ABNORMAL HIGH (ref 0.1–1.0)
Monocytes Relative: 11 % (ref 3–12)
Neutro Abs: 8 10*3/uL — ABNORMAL HIGH (ref 1.7–7.7)
Neutrophils Relative %: 50 % (ref 43–77)
Platelets: 448 10*3/uL — ABNORMAL HIGH (ref 150–400)
RBC: 3.1 MIL/uL — ABNORMAL LOW (ref 4.22–5.81)
RDW: 15.8 % — ABNORMAL HIGH (ref 11.5–15.5)
WBC: 15.9 10*3/uL — ABNORMAL HIGH (ref 4.0–10.5)

## 2013-07-11 LAB — COMPREHENSIVE METABOLIC PANEL
ALT: 24 U/L (ref 0–53)
AST: 24 U/L (ref 0–37)
Albumin: 3.2 g/dL — ABNORMAL LOW (ref 3.5–5.2)
Alkaline Phosphatase: 101 U/L (ref 39–117)
BUN: 15 mg/dL (ref 6–23)
CO2: 23 mEq/L (ref 19–32)
Calcium: 9.1 mg/dL (ref 8.4–10.5)
Chloride: 110 mEq/L (ref 96–112)
Creatinine, Ser: 0.81 mg/dL (ref 0.50–1.35)
GFR calc Af Amer: >90 mL/min (ref >90)
GFR calc non Af Amer: >90 mL/min (ref >90)
Glucose, Bld: 96 mg/dL (ref 70–99)
Potassium: 3.9 mEq/L (ref 3.5–5.1)
Sodium: 142 mEq/L (ref 135–145)
Total Bilirubin: 0.3 mg/dL (ref 0.3–1.2)
Total Protein: 6.6 g/dL (ref 6.0–8.3)

## 2013-07-11 LAB — POCT CBC
Granulocyte percent: 62.2 %G (ref 37–80)
HCT, POC: 27.5 % — AB (ref 43.5–53.7)
Hemoglobin: 9.2 g/dL — AB (ref 14.1–18.1)
Lymph, poc: 5.9 — AB (ref 0.6–3.4)
MCH, POC: 29.5 pg (ref 27–31.2)
MCHC: 33.5 g/dL (ref 31.8–35.4)
MCV: 88.1 fL (ref 80–97)
MPV: 8.3 fL (ref 0–99.8)
POC Granulocyte: 11.5 — AB (ref 2–6.9)
POC LYMPH PERCENT: 31.8 %L (ref 10–50)
Platelet Count, POC: 421 10*3/uL (ref 142–424)
RBC: 3.1 M/uL — AB (ref 4.69–6.13)
RDW, POC: 15.5 %
WBC: 18.5 10*3/uL — AB (ref 4.6–10.2)

## 2013-07-11 LAB — APTT: aPTT: 29 seconds (ref 24–37)

## 2013-07-11 LAB — HEMOGLOBIN AND HEMATOCRIT, BLOOD
HCT: 27.4 % — ABNORMAL LOW (ref 39.0–52.0)
Hemoglobin: 8.6 g/dL — ABNORMAL LOW (ref 13.0–17.0)

## 2013-07-11 LAB — PROTIME-INR
INR: 1.05 (ref 0.00–1.49)
Prothrombin Time: 13.5 seconds (ref 11.6–15.2)

## 2013-07-11 MED ORDER — SODIUM CHLORIDE 0.9 % IJ SOLN: 3.0000 mL | INTRAMUSCULAR | Status: DC | PRN

## 2013-07-11 MED ORDER — SODIUM CHLORIDE 0.9 % IJ SOLN
3.0000 mL | Freq: Two times a day (BID) | INTRAMUSCULAR | Status: DC
Start: 2013-07-11 — End: 2013-07-14
  Administered 2013-07-12 (×2): 3 mL via INTRAVENOUS

## 2013-07-11 MED ORDER — TIOTROPIUM BROMIDE MONOHYDRATE 18 MCG IN CAPS
18.0000 ug | ORAL_CAPSULE | RESPIRATORY_TRACT | Status: DC
  Administered 2013-07-12 – 2013-07-14 (×3): 18 ug via RESPIRATORY_TRACT
  Filled 2013-07-11: qty 5

## 2013-07-11 MED ORDER — SODIUM CHLORIDE 0.9 % IV SOLN
250.0000 mL | INTRAVENOUS | Status: DC | PRN
Start: 2013-07-11 — End: 2013-07-14

## 2013-07-11 MED ORDER — ONDANSETRON HCL 4 MG/2ML IJ SOLN
4.0000 mg | Freq: Three times a day (TID) | INTRAMUSCULAR | Status: DC | PRN
  Filled 2013-07-11: qty 2

## 2013-07-11 MED ORDER — ONDANSETRON HCL 4 MG PO TABS: 4.0000 mg | ORAL_TABLET | Freq: Four times a day (QID) | ORAL | Status: DC | PRN

## 2013-07-11 MED ORDER — LEVETIRACETAM 500 MG PO TABS
500.0000 mg | ORAL_TABLET | Freq: Two times a day (BID) | ORAL | Status: DC
  Administered 2013-07-12 – 2013-07-14 (×6): 500 mg via ORAL
  Filled 2013-07-11 (×7): qty 1

## 2013-07-11 MED ORDER — ATORVASTATIN CALCIUM 80 MG PO TABS
80.0000 mg | ORAL_TABLET | Freq: Every day | ORAL | Status: DC
  Administered 2013-07-12 – 2013-07-13 (×3): 80 mg via ORAL
  Filled 2013-07-11 (×4): qty 1

## 2013-07-11 MED ORDER — SODIUM CHLORIDE 0.9 % IJ SOLN
3.0000 mL | Freq: Two times a day (BID) | INTRAMUSCULAR | Status: DC
  Administered 2013-07-13: 3 mL via INTRAVENOUS

## 2013-07-11 MED ORDER — ONDANSETRON HCL 4 MG/2ML IJ SOLN: 4.0000 mg | Freq: Four times a day (QID) | INTRAMUSCULAR | Status: DC | PRN
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
## 2013-07-11 NOTE — Telephone Encounter (Signed)
Mrs. Rosensteel called GI this am because patient was having some GI bleeding - they instructed her to call his primary care office.  Please advise if appt needed

## 2013-07-11 NOTE — Telephone Encounter (Signed)
Appt scheduled. Patient aware. 
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
## 2013-07-11 NOTE — ED Notes (Signed)
PT. REPORTS BLOODY STOOLS ( X3) ONSET LAST NIGHT , DENIES INJURY , ALERT AND ORIENTED , AMBULATORY , PT. STATED HE IS TAKING LOVENOX , HISTORY OF CVA /PE. RESPIRATIONS UNLABORED . NO ABDOMINAL PAIN .

## 2013-07-11 NOTE — Telephone Encounter (Signed)
Patient's wife calls with question about when to restart warfarin after lung biopsy.  Usually the provider performing the biopsy will make recommendations on when to restart based on how the procedure goes.  I told her to call the office though if they did not give instructions.  Travis Cruz also mentions that Travis Cruz had some GI bleeding this am and she has called GI specialist and office that will preform biopsy to see if lung biopsy should be postponed or if labs are needed (he has been injecting Lovenox over the last 10 days - last night was his last injection prior to biopsy).  We also discussed the possibility of changing to Xarelto in the future.  This was discusses with neurologist last Friday, July 11th at pat's appt.  Travis Cruz states that the neurologist said she was only seeing him for follow up of his seizures and that he would need to discuss anticoagulation with his "stroke doctors". Told patient that we will wait to see what the lung biopsy concludes and then will consult with cardio - Dr Kirtland Bouchard and decide if patient would be appropriate candidate for Xarelto in place of warfarin.
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
## 2013-07-11 NOTE — Telephone Encounter (Signed)
He is scheduled for a lung biopsy tomorrow and finished his Lovenox yesterday.  He has been passing some bright red blood when he goes to the bathroom from his rectum for the last couple of days. Needs advice.  According to his discharge instructions from the [facility] he was to call his PCP if this situation was to arise.  I informed her of this. He was able to see Dr. Modesto Charon at 4 pm today.  We have not cancelled the biopsy appointment for tomorrow morning until we hear from Dr. Nash Dimmer evaluation.  Mrs. Lindholm is aware.  I informed Dr. Tyrone Sage of this also.

## 2013-07-12 ENCOUNTER — Ambulatory Visit (HOSPITAL_COMMUNITY): Admission: RE | Admit: 2013-07-12 | Payer: BC Managed Care – PPO | Source: Ambulatory Visit

## 2013-07-12 ENCOUNTER — Ambulatory Visit: Payer: BC Managed Care – PPO | Admitting: Cardiothoracic Surgery

## 2013-07-12 LAB — HEMOGLOBIN AND HEMATOCRIT, BLOOD
HCT: 28.4 % — ABNORMAL LOW (ref 39.0–52.0)
HCT: 27.1 % — ABNORMAL LOW (ref 39.0–52.0)
Hemoglobin: 9.2 g/dL — ABNORMAL LOW (ref 13.0–17.0)
Hemoglobin: 8.8 g/dL — ABNORMAL LOW (ref 13.0–17.0)

## 2013-07-12 LAB — BASIC METABOLIC PANEL
BUN: 15 mg/dL (ref 6–23)
CO2: 25 mEq/L (ref 19–32)
Calcium: 9 mg/dL (ref 8.4–10.5)
Chloride: 110 mEq/L (ref 96–112)
Creatinine, Ser: 0.86 mg/dL (ref 0.50–1.35)
GFR calc Af Amer: >90 mL/min (ref >90)
GFR calc non Af Amer: 89 mL/min — ABNORMAL LOW (ref >90)
Glucose, Bld: 90 mg/dL (ref 70–99)
Potassium: 3.7 mEq/L (ref 3.5–5.1)
Sodium: 142 mEq/L (ref 135–145)

## 2013-07-12 LAB — CBC
HCT: 24.5 % — ABNORMAL LOW (ref 39.0–52.0)
Hemoglobin: 7.6 g/dL — ABNORMAL LOW (ref 13.0–17.0)
MCH: 27.5 pg (ref 26.0–34.0)
MCHC: 31 g/dL (ref 30.0–36.0)
MCV: 88.8 fL (ref 78.0–100.0)
Platelets: 428 10*3/uL — ABNORMAL HIGH (ref 150–400)
RBC: 2.76 MIL/uL — ABNORMAL LOW (ref 4.22–5.81)
RDW: 15.7 % — ABNORMAL HIGH (ref 11.5–15.5)
WBC: 12.1 10*3/uL — ABNORMAL HIGH (ref 4.0–10.5)

## 2013-07-12 LAB — PROTIME-INR
INR: 1.13 (ref 0.00–1.49)
Prothrombin Time: 14.3 seconds (ref 11.6–15.2)

## 2013-07-12 LAB — PREPARE RBC (CROSSMATCH)

## 2013-07-12 MED ORDER — FUROSEMIDE 10 MG/ML IJ SOLN
20.0000 mg | Freq: Once | INTRAMUSCULAR | Status: AC
  Administered 2013-07-12: 20 mg via INTRAVENOUS
  Filled 2013-07-12: qty 2

## 2013-07-12 MED ORDER — SODIUM CHLORIDE 0.9 % IV SOLN
INTRAVENOUS | Status: DC
  Administered 2013-07-12: 17:00:00 via INTRAVENOUS

## 2013-07-12 MED ORDER — PEG 3350-KCL-NA BICARB-NACL 420 G PO SOLR
4000.0000 mL | Freq: Once | ORAL | Status: AC
  Administered 2013-07-12: 4000 mL via ORAL
  Filled 2013-07-12: qty 4000

## 2013-07-12 MED ORDER — DIPHENHYDRAMINE HCL 50 MG/ML IJ SOLN
25.0000 mg | Freq: Once | INTRAMUSCULAR | Status: AC
  Administered 2013-07-12: 25 mg via INTRAVENOUS
  Filled 2013-07-12: qty 1

## 2013-07-12 MED ORDER — ACETAMINOPHEN 325 MG PO TABS
650.0000 mg | ORAL_TABLET | Freq: Once | ORAL | Status: AC
  Administered 2013-07-12: 650 mg via ORAL
  Filled 2013-07-12: qty 2

## 2013-07-12 NOTE — Progress Notes (Addendum)
Patient ID: Travis Cruz, male   DOB: Oct 24, 1946, 68 y.o.   MRN: [id number]    Scheduled as OP for L  Lung mass biopsy in CT/IR today  Admitted 2 weeks ago for rectal bleeding- colonoscopy no bleeding site Came off coumadin (treating PE/DVT) On Lovenox now Held today  Admitted yesterday for rectal bleeding Resolving per chart Hgb this am 7.6 though  GI consult on board? Will discuss with MD and Rad - move forward with bx or wait.   ADDENDUM: Discussed with Dr Grace Isaac- Int Rad We need to wait to be sure all issues are addressed and Hgb stable and wnl Before we move ahead with lung bx. Can be rescheduled as OP-- Please call [phone] to reschedule asap.  Can also be done as IP if  hgb wnl and stable During this hospitalization  Will inform MD

## 2013-07-12 NOTE — ED Provider Notes (Signed)
I saw and evaluated the patient, reviewed the resident's note and I agree with the findings and plan. Patient with GI bleeding. Quite negative and hemoglobin is mildly decreased. Recently required transfusions for GI bleed. He is due to have a lung biopsy tomorrow. Will be admitted  Juliet Rude. Rubin Payor, MD 07/12/13 1450

## 2013-07-12 NOTE — Progress Notes (Signed)
Utilization review completed.  
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
## 2013-07-12 NOTE — Progress Notes (Signed)
Pt admitted to the unit. Pt is alert and oriented, spouse at the bedside. Pt oriented to room, staff, and call bell. Bed in lowest position. Full assessment to Epic. Call bell with in reach. Told to call for assists. Will continue to monitor. Burley Saver, RN
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
## 2013-07-13 ENCOUNTER — Encounter (HOSPITAL_COMMUNITY)
Admission: EM | Disposition: A | Discharge status: Home / Self Care | Payer: Self-pay | Source: Home / Self Care | Attending: Family Medicine

## 2013-07-13 DIAGNOSIS — R918 Other nonspecific abnormal finding of lung field: Secondary | ICD-10-CM

## 2013-07-13 LAB — CBC WITH DIFFERENTIAL/PLATELET
Basophils Absolute: 0.1 10*3/uL (ref 0.0–0.1)
Basophils Relative: 0 % (ref 0–1)
Eosinophils Absolute: 0.8 10*3/uL — ABNORMAL HIGH (ref 0.0–0.7)
Eosinophils Relative: 7 % — ABNORMAL HIGH (ref 0–5)
HCT: 27.1 % — ABNORMAL LOW (ref 39.0–52.0)
Hemoglobin: 8.6 g/dL — ABNORMAL LOW (ref 13.0–17.0)
Lymphocytes Relative: 34 % (ref 12–46)
Lymphs Abs: 4 10*3/uL (ref 0.7–4.0)
MCH: 27.6 pg (ref 26.0–34.0)
MCHC: 31.7 g/dL (ref 30.0–36.0)
MCV: 86.9 fL (ref 78.0–100.0)
Monocytes Absolute: 1.2 10*3/uL — ABNORMAL HIGH (ref 0.1–1.0)
Monocytes Relative: 11 % (ref 3–12)
Neutro Abs: 5.8 10*3/uL (ref 1.7–7.7)
Neutrophils Relative %: 49 % (ref 43–77)
Platelets: 428 10*3/uL — ABNORMAL HIGH (ref 150–400)
RBC: 3.12 MIL/uL — ABNORMAL LOW (ref 4.22–5.81)
RDW: 16 % — ABNORMAL HIGH (ref 11.5–15.5)
WBC: 11.8 10*3/uL — ABNORMAL HIGH (ref 4.0–10.5)

## 2013-07-13 LAB — TYPE AND SCREEN
ABO/RH(D): O POS
Antibody Screen: NEGATIVE
Unit division: 0

## 2013-07-13 SURGERY — COLONOSCOPY
Anesthesia: Moderate Sedation

## 2013-07-13 NOTE — Progress Notes (Signed)
Travis Cruz ZOX:[id number] DOB: Mar 23, 1946 DOA: 07/11/2013 PCP: Redmond Baseman, MD  Brief narrative: 67 yr old male, known h/o PE/CVa 2012 complicated by seizure, ischemic CM s/p implanted Defib 2/2, COPD/OSA to VF arrest, Anemia 2/2 to GIB admitted 6/30 with ALGIB Divertiluli and 1.5 sessile desc colon/8 mm sigmoid polyp.  Patient was d/c on lovenox given need for lung biospy scheduled for 07/12/13 by Dr. Tyrone Sage to r/o lung ca   Assessment/Plan: 1. Lower GI Bleed, post polypectomy bleed - Appreciate Dr. Buccini's input - bleeding resolved now, Hb improved after transfusion, no plans for colonoscopy at this point.  - resume Diet - d/w Dr. Matthias Hughs re: anticoagulation, ok to restart lovenox in 2-3 days. - CBC in am  2. Anemia-likely ABLA-Hb improved  - basleine around 9.  Transfused 1 U PRBC, rpt Hb in am  3. H/o DVT/Stroke-At moderate risk for recurrent event off anticoagulation-plan restart lovenox/coumadin on Saturday if no further bleeding  4. Isch CM s/defib-stable currently-not on any cardiac meds  5. Seizure prevention-continue Keppra 500 bid  6. OSA-continue CPAP qhs  7. Lung nodule - improved in size compared to prior CT, was sheduled to have this done per Dr.Gerhardt as outpatient, per IR since nodule is smaller recommended FU CT chest in 4-6weeks  8. HLd-continue Lipitor 80 daily  Code Status: Full Family Communication:  Discussed full POC at bedside with patient's wife Disposition: home tomorrow if stable  Consultants:  GI Buccini  Watts-IR  Procedures:  None so far  Antibiotics:  none   Subjective  Feels ok, no further bleeding, wondering if he can eat now   Objective    Interim History: none  Telemetry: s1 s2 no m/r/g  Objective: Filed Vitals:   07/12/13 2228 07/13/13 0500 07/13/13 0946 07/13/13 1355  BP:  99/62 117/77 101/68  Pulse: 80 81 89 90  Temp:  97.7 F (36.5 C) 98.6 F (37 C) 98 F (36.7 C)  TempSrc:  Oral Oral  Oral  Resp: 18 18 18 18   Height:      Weight:      SpO2:  96% 94% 96%    Intake/Output Summary (Last 24 hours) at 07/13/13 1528 Last data filed at 07/13/13 1100  Gross per 24 hour  Intake    840 ml  Output   1500 ml  Net   -660 ml    Exam:  General: alert calm. Cardiovascular: s1 s2 no m/r/g Respiratory: clear, no added sound  Abdomen: soft/nt/nd Skinintact Neurointact  Data Reviewed: Basic Metabolic Panel:  Recent Labs Lab 07/11/13 2015 07/12/13 0620  NA 142 142  K 3.9 3.7  CL 110 110  CO2 23 25  GLUCOSE 96 90  BUN 15 15  CREATININE 0.81 0.86  CALCIUM 9.1 9.0   Liver Function Tests:  Recent Labs Lab 07/11/13 2015  AST 24  ALT 24  ALKPHOS 101  BILITOT 0.3  PROT 6.6  ALBUMIN 3.2*   No results found for this basename: LIPASE, AMYLASE,  in the last 168 hours No results found for this basename: AMMONIA,  in the last 168 hours CBC:  Recent Labs Lab 07/11/13 1746  07/11/13 2015 07/11/13 2327 07/12/13 0620 07/12/13 1820 07/12/13 2315 07/13/13 0535  WBC 18.5*  --  15.9*  --  12.1*  --   --  11.8*  NEUTROABS  --   --  8.0*  --   --   --   --  5.8  HGB 9.2*  < > 8.7*  8.6* 7.6* 9.2* 8.8* 8.6*  HCT 27.5*  < > 27.4* 27.4* 24.5* 28.4* 27.1* 27.1*  MCV 88.1  --  88.4  --  88.8  --   --  86.9  PLT  --   --  448*  --  428*  --   --  428*  < > = values in this interval not displayed. Cardiac Enzymes: No results found for this basename: CKTOTAL, CKMB, CKMBINDEX, TROPONINI,  in the last 168 hours BNP: No components found with this basename: POCBNP,  CBG: No results found for this basename: GLUCAP,  in the last 168 hours  No results found for this or any previous visit (from the past 240 hour(s)).   Studies:              All Imaging reviewed and is as per above notation   Scheduled Meds: . atorvastatin  80 mg Oral QHS  . levETIRAcetam  500 mg Oral BID  . sodium chloride  3 mL Intravenous Q12H  . sodium chloride  3 mL Intravenous Q12H  . tiotropium   18 mcg Inhalation Q24H   Continuous Infusions: . sodium chloride 20 mL/hr at 07/12/13 1724     Zannie Cove, MD  [facility] Pager [phone] 07/13/2013, 3:28 PM    LOS: 2 days

## 2013-07-13 NOTE — Progress Notes (Signed)
Patient with 3rd watery bowel movement tonight.  BM is clear with brown streaks.  No visible blood.  Patient had no complaints. Vital signs is stable.  Will continue to monitor.

## 2013-07-13 NOTE — Progress Notes (Signed)
Patient ID: Travis Cruz, male   DOB: 05/13/1946, 67 y.o.   MRN: [id number]   Pt had been scheduled as OP for Left lung mass biopsy on 07/11/13. Seen by Dr Sherene Sires and referred to Dr Tyrone Sage then scheduled for bx.  He was admitted to hospital the day before bx  for rectal bleeding. He had a colonoscopy 2 weeks before admit- new onset bleeding. GI work up now complete Bleeding resolved Hg stable.  Hg 7.6 7/14; we held bx for that day.  Work up now complete; Hg 8.6 But in review of films with Dr Deanne Coffer this am He feels nodule is actually getting smaller.  Recommending waiting on Bx for now. Repeat CT in 4-6 weeks from initial CT.  Dr Jomarie Longs aware. Dr Tyrone Sage aware. Dr Sherene Sires aware.

## 2013-07-13 NOTE — Progress Notes (Signed)
The patient's fecal affluent became clear during the course of his colonoscopy prep, not surprisingly since he had had no bowel movements and no bleeding for 24 hours prior to the onset of the prep. His hemoglobin is stable over the past 24 hours. I did a rectal exam today which shows liquid yellow stool, no visible blood.   Thus, it appears that his bleeding, which is felt most likely to be post polypectomy bleeding, has resolved.   As per my discussion yesterday afternoon with the patient, and after discussing things with his attending hospitalist, Dr. Jomarie Longs, we have come to the conclusion that it is not necessary to do colonoscopy in this patient at this time. Considering his moderate cardiopulmonary risk for the procedure (history of cardiac arrest, defibrillator, coronary disease, cardiomyopathy, sleep apnea), I do not feel that the risk of the procedure is justified because we already have hemostasis and the likelihood that we would achieve significant diagnostic or therapeutic benefit from the procedure is quite low.   I have recommended that, if possible, the patient be kept off his Lovenox for another one to 3 days, to help achieve maturation of his clot and progressive epithelialization of this polypectomy eschar, thereby diminishing the risk of rebleeding when anticoagulation is resumed.   If the patient is going to be restarted on Lovenox today or tomorrow, it would be prudent to observe him overnight in the hospital. However, if he is discharged and the Lovenox is not going to be started for several days, I feel that the risk of it inciting recurrent bleeding would be so low, that it is okay for that to be done as an outpatient. In other words, it is not necessary to keep the patient in the hospital until the Lovenox is restarted, if it is going to be several days before that occurs.  I would monitor a daily CBC as long as the patient is in the hospital.  Florencia Reasons,  M.D. [phone]

## 2013-07-14 ENCOUNTER — Telehealth: Payer: Self-pay | Admitting: Family Medicine

## 2013-07-14 DIAGNOSIS — I82409 Acute embolism and thrombosis of unspecified deep veins of unspecified lower extremity: Secondary | ICD-10-CM

## 2013-07-14 DIAGNOSIS — IMO0002 Reserved for concepts with insufficient information to code with codable children: Principal | ICD-10-CM

## 2013-07-14 LAB — CBC
HCT: 26.4 % — ABNORMAL LOW (ref 39.0–52.0)
Hemoglobin: 8.4 g/dL — ABNORMAL LOW (ref 13.0–17.0)
MCH: 27.5 pg (ref 26.0–34.0)
MCHC: 31.8 g/dL (ref 30.0–36.0)
MCV: 86.6 fL (ref 78.0–100.0)
Platelets: 442 10*3/uL — ABNORMAL HIGH (ref 150–400)
RBC: 3.05 MIL/uL — ABNORMAL LOW (ref 4.22–5.81)
RDW: 16 % — ABNORMAL HIGH (ref 11.5–15.5)
WBC: 11.7 10*3/uL — ABNORMAL HIGH (ref 4.0–10.5)

## 2013-07-14 MED ORDER — WARFARIN SODIUM 2 MG PO TABS: 4.0000 mg | ORAL_TABLET | Freq: Every day | ORAL | Status: DC

## 2013-07-14 MED ORDER — ENOXAPARIN SODIUM 80 MG/0.8ML ~~LOC~~ SOLN: 80.0000 mg | Freq: Two times a day (BID) | SUBCUTANEOUS | Status: DC

## 2013-07-14 NOTE — Progress Notes (Signed)
Patient discharged home with wife. Patient discharged with information on prescriptions and discharge instructions. Patient was educated on lab appointments and coumadin/lovenox. Patient was told to contact the doctor with questions and concerns. Patient was stable upon discharge.

## 2013-07-14 NOTE — Progress Notes (Signed)
Hgb essentially stable x 48 hr w/ no clinical evidence of bleeding (had non-bloody BM today).  Plan for dischg noted, w/ delayed resumption of Lovenox to allow further healing time at polypectomy site.  I feel this is a good compromise between risk of bleeding and risk of thromboembolic complications.  I advised pt to watch for recurrent bleeding (unlikely) and to go to ER if it occurs.  I do not feel GI f/u will be needed, since pt has f/u scheduled w/ his PCP.  Florencia Reasons, M.D. [phone]

## 2013-07-14 NOTE — Discharge Summary (Signed)
Physician Discharge Summary  Travis Cruz WUJ:[id number] DOB: August 09, 1946 DOA: 07/11/2013  PCP: Redmond Baseman, MD  Admit date: 07/11/2013 Discharge date: 07/14/2013  Time spent: 50 minutes  Recommendations for Outpatient Follow-up:  1. INR check 7/21 2. Stop Lovenox when INR >2 3. PCP Dr.Wong in 1 week 4. Repeat Ct chest to FU nodule in 6 weeks  Discharge Diagnoses:    Post-polypectomy bleeding   Diverticulosis   COPD GOLD II   H/o DVT/PE (pulmonary embolism)   Implantable cardiac defibrillator -[employer]   Ischemic cardiomyopathy, last EF improved and 60% now   Solitary pulmonary nodule   CAD (coronary atherosclerotic disease)   Anemia associated with acute blood loss   OSA   Seizure disorder   Discharge Condition: stable  Diet recommendation: heart healthy  Filed Weights   07/11/13 2320 07/12/13 2121 07/13/13 2100  Weight: 90.4 kg (199 lb 4.7 oz) 90.946 kg (200 lb 8 oz) 87.544 kg (193 lb)    History of present illness:  67 yo male with h/o recent lgib (had diverticular bleed with  colonoscopy in the last month) and s/p polypectomy, h/o DVT/ PE, recent cardiac arrest s/p icd placement, ishcemic CM EF 35%, lung nodule that is supposed to be biopsied in am by radiology as outpt who has been off his coumadin for a week but on a lovenox bridge for the lung bx comes in from pcp office due to rectal bleeding x 3 bowel movements the night prior to admission. Pt states he has not had any further blood from rectum in over 12 hours. ED did rectal exam and was no gross blood present. Pt denies any abd pain, no n/v. No dizziness. No syncope, no sob. No hemoptysis. No le swelling. Last lovenox shot was last night. His hgb is slightly lower than previous values, he did require blood transfusion last hospitalization.     Hospital Course:  1. Lower GI Bleed, post polypectomy bleed - Appreciate Dr. Buccini's input  - bleeding resolved, no bleeding noted in 48hours and was prepped  for colonoscopy initially, but since bleeding completely resolved this was not felt to be necessary. -was transfused 1 unit of PRBC on admission and Hb has been stable since.  - d/w Dr. Matthias Hughs re: anticoagulation, he felt that it would be ok to restart lovenox in 2-3 days.  -to help achieve maturation of his clot and progressive epithelialization of this polypectomy eschar, thereby diminishing the risk of rebleeding when anticoagulation is resumed.  -hence plan to resume Lovenox and coumadin on Saturday 7/19 and get INR checked at dr.Wongs office on Monday 7/21 and continue lovenox till INR therapeutic  2. Anemia-likely ABLA-Hb improved  - basleine around 9. Transfused 1 U PRBC on admission, Hb stable in 8.4-8.6 range since.   3. H/o DVT/Stroke and PFO-At moderate risk for recurrent event off anticoagulation-plan restart lovenox/coumadin on Saturday as noted above if no further bleeding  4. Isch CM s/defib-stable currently-not on any cardiac meds  5. Seizure prevention-continue Keppra 500 bid  6. OSA-continue CPAP qhs  7. Lung nodule - nodule smaller in size compared to prior CT, Per Interventional radiology, this was sheduled per Dr.Gerhardt as outpatient, per IR since nodule is smaller recommended FU CT chest in 4-6weeks. Dr.Gerhardt notified per IR about cancellation of biopsy at this time.  8. HLd-continue Lipitor 80 daily     Consultations:  Enid Baas Dr.Buccini  Discharge Exam: Filed Vitals:   07/13/13 1355 07/13/13 1735 07/13/13 2100 07/14/13 0540  BP:  101/68 95/60 101/66 93/59  Pulse: 90 88 89 74  Temp: 98 F (36.7 C) 97.8 F (36.6 C) 98.6 F (37 C) 97.6 F (36.4 C)  TempSrc: Oral Oral Oral Oral  Resp: 18 20 20 20   Height:   5\' 9"  (1.753 m)   Weight:   87.544 kg (193 lb)   SpO2: 96% 98% 93% 95%    General: AAOx3 Cardiovascular: S1S2/RRR Respiratory:CTAB  Discharge Instructions  Discharge Orders   Future Appointments Provider Department Dept Phone    08/10/2013 10:30 AM Rollene Rotunda, MD [facility] at [place] [phone]   08/19/2013 8:40 AM Ileana Ladd, MD [facility] [phone]   09/26/2013 9:35 AM [facility] Main Office [place]) [phone]   Future Orders Complete By Expires     Diet - low sodium heart healthy  As directed     Increase activity slowly  As directed         Medication List         atorvastatin 80 MG tablet  Commonly known as:  LIPITOR  Take 1 tablet (80 mg total) by mouth at bedtime.     enoxaparin 80 MG/0.8ML injection  Commonly known as:  LOVENOX  Inject 0.8 mLs (80 mg total) into the skin every 12 (twelve) hours. Starting Saturday 7/19, To be stopped when INR >2     levETIRAcetam 500 MG tablet  Commonly known as:  KEPPRA  Take 500 mg by mouth 2 (two) times daily.     tiotropium 18 MCG inhalation capsule  Commonly known as:  SPIRIVA  Place 18 mcg into inhaler and inhale daily.     warfarin 2 MG tablet  Commonly known as:  COUMADIN  Take 2 tablets (4 mg total) by mouth daily. Take 4mg  on 7/19 and 7/20 and then based on INR check 7/21       Allergies  Allergen Reactions  . Ativan (Lorazepam) Anxiety and Other (See Comments)    Hyper  [name]       Follow-up Information   Follow up with INR check. Schedule an appointment as soon as possible for a visit on 07/18/2013.      Follow up with Redmond Baseman, MD. Schedule an appointment as soon as possible for a visit in 1 week.   Contact information:   [address] [place] [address] [phone]       Follow up with CT chest In 6 weeks.       The results of significant diagnostics from this hospitalization (including imaging, microbiology, ancillary and laboratory) are listed below for reference.    Significant Diagnostic Studies: Ct Head W Wo Contrast  06/23/2013   *RADIOLOGY REPORT*  Clinical Data: Syncopal episode.   History of lung cancer and previous CVA.  CT HEAD WITHOUT AND WITH CONTRAST  Technique:  Contiguous axial images were obtained from the base of the skull through the vertex without and with intravenous contrast.  Contrast: 75mL OMNIPAQUE IOHEXOL 300 MG/ML  SOLN  Comparison: 01/22/2013.  Findings: The ventricles are in the midline without mass effect or shift.  There are stable in size and configuration.  No extra-axial fluid collections are identified.  Stable mild cerebral atrophy.  There are multiple remote infarctions but no findings for intracranial metastatic disease or new infarct.  The major vascular structures appear normal including the dural venous sinuses.  No findings for osseous metastatic disease.  Scattered ethmoid sinus disease  is noted.  IMPRESSION:  1.  No CT findings to suggest intracranial metastatic disease. 2.  Remote cerebral and cerebellar infarcts.   Original Report Authenticated By: Rudie Meyer, M.D.   Ct Super D Chest Wo Contrast  06/29/2013   *RADIOLOGY REPORT*  Clinical Data:  Left upper lobe nodule.  CT CHEST WITHOUT CONTRAST  Technique:  Multidetector CT imaging of the chest was performed using thin slice collimation for electromagnetic bronchoscopy planning purposes, without intravenous contrast.  Comparison:  PET 06/01/2013 and CT chest 05/25/2013.  Findings:  No pathologically enlarged mediastinal or axillary lymph nodes.  Hilar regions are difficult to definitively evaluate without IV contrast.  Coronary artery calcification.  Heart size normal.  No pericardial effusion.  Emphysema.  A spiculated nodule in the left upper lobe measures 1.2 x 1.5 cm (previously 1.4 x 1.8 cm).  There may be mild basilar predominant superimposed subpleural reticulation.  No pleural fluid.  Airway is unremarkable.  Incidental imaging of the upper abdomen shows no acute findings. Small stones layer in the gallbladder.  Tiny stone in the left kidney.  No worrisome lytic or sclerotic lesions.   Degenerative changes are seen in the spine. Old left rib fractures. Lower thoracic compression fracture is unchanged.  IMPRESSION:  1.  Spiculated left upper lobe nodule measures slightly smaller than on prior exams but remains worrisome for primary bronchogenic carcinoma. 2.  Emphysema with suspected bibasilar superimposed subpleural fibrosis. 3.  Extensive coronary artery calcification. 4.  Cholelithiasis. 5.  Left nephrolithiasis.   Original Report Authenticated By: Leanna Battles, M.D.    Microbiology: No results found for this or any previous visit (from the past 240 hour(s)).   Labs: Basic Metabolic Panel:  Recent Labs Lab 07/11/13 2015 07/12/13 0620  NA 142 142  K 3.9 3.7  CL 110 110  CO2 23 25  GLUCOSE 96 90  BUN 15 15  CREATININE 0.81 0.86  CALCIUM 9.1 9.0   Liver Function Tests:  Recent Labs Lab 07/11/13 2015  AST 24  ALT 24  ALKPHOS 101  BILITOT 0.3  PROT 6.6  ALBUMIN 3.2*   No results found for this basename: LIPASE, AMYLASE,  in the last 168 hours No results found for this basename: AMMONIA,  in the last 168 hours CBC:  Recent Labs Lab 07/11/13 1746 07/11/13 2015  07/12/13 0620 07/12/13 1820 07/12/13 2315 07/13/13 0535 07/14/13 0535  WBC 18.5* 15.9*  --  12.1*  --   --  11.8* 11.7*  NEUTROABS  --  8.0*  --   --   --   --  5.8  --   HGB 9.2* 8.7*  < > 7.6* 9.2* 8.8* 8.6* 8.4*  HCT 27.5* 27.4*  < > 24.5* 28.4* 27.1* 27.1* 26.4*  MCV 88.1 88.4  --  88.8  --   --  86.9 86.6  PLT  --  448*  --  428*  --   --  428* 442*  < > = values in this interval not displayed. Cardiac Enzymes: No results found for this basename: CKTOTAL, CKMB, CKMBINDEX, TROPONINI,  in the last 168 hours BNP: BNP (last 3 results) No results found for this basename: PROBNP,  in the last 8760 hours CBG: No results found for this basename: GLUCAP,  in the last 168 hours     Signed:  Caterine Mcmeans  [facility] 07/14/2013, 9:46 AM

## 2013-07-14 NOTE — Telephone Encounter (Signed)
appt given for the 23 with wong

## 2013-07-18 ENCOUNTER — Ambulatory Visit (INDEPENDENT_AMBULATORY_CARE_PROVIDER_SITE_OTHER): Payer: BC Managed Care – PPO | Admitting: Pharmacist

## 2013-07-18 DIAGNOSIS — I82409 Acute embolism and thrombosis of unspecified deep veins of unspecified lower extremity: Secondary | ICD-10-CM

## 2013-07-18 LAB — POCT INR: INR: 1.3

## 2013-07-18 NOTE — Patient Instructions (Signed)
Anticoagulation Dose Instructions as of 07/18/2013     Travis Cruz Tue Wed Thu Fri Sat   New Dose 4 mg 2 mg 4 mg 2 mg 4 mg 2 mg 4 mg    Description       Take 4mg  today and tomorrow.  Continue Lovenox as well.  Recheck Protime Wednesday, July 23rd.       INR was 1.3 today

## 2013-07-20 ENCOUNTER — Encounter: Payer: Self-pay | Admitting: Family Medicine

## 2013-07-20 ENCOUNTER — Ambulatory Visit (INDEPENDENT_AMBULATORY_CARE_PROVIDER_SITE_OTHER): Payer: Self-pay | Admitting: Pharmacist

## 2013-07-20 ENCOUNTER — Ambulatory Visit (INDEPENDENT_AMBULATORY_CARE_PROVIDER_SITE_OTHER): Payer: BC Managed Care – PPO | Admitting: Family Medicine

## 2013-07-20 VITALS — BP 111/66 | HR 105 | Temp 97.7°F | Ht 69.0 in | Wt 196.0 lb

## 2013-07-20 DIAGNOSIS — I255 Ischemic cardiomyopathy: Secondary | ICD-10-CM

## 2013-07-20 DIAGNOSIS — I2589 Other forms of chronic ischemic heart disease: Secondary | ICD-10-CM

## 2013-07-20 DIAGNOSIS — I639 Cerebral infarction, unspecified: Secondary | ICD-10-CM

## 2013-07-20 DIAGNOSIS — E785 Hyperlipidemia, unspecified: Secondary | ICD-10-CM

## 2013-07-20 DIAGNOSIS — I2699 Other pulmonary embolism without acute cor pulmonale: Secondary | ICD-10-CM

## 2013-07-20 DIAGNOSIS — R569 Unspecified convulsions: Secondary | ICD-10-CM

## 2013-07-20 DIAGNOSIS — I10 Essential (primary) hypertension: Secondary | ICD-10-CM

## 2013-07-20 DIAGNOSIS — I82409 Acute embolism and thrombosis of unspecified deep veins of unspecified lower extremity: Secondary | ICD-10-CM

## 2013-07-20 DIAGNOSIS — D62 Acute posthemorrhagic anemia: Secondary | ICD-10-CM | POA: Insufficient documentation

## 2013-07-20 DIAGNOSIS — Z95810 Presence of automatic (implantable) cardiac defibrillator: Secondary | ICD-10-CM

## 2013-07-20 DIAGNOSIS — I6350 Cerebral infarction due to unspecified occlusion or stenosis of unspecified cerebral artery: Secondary | ICD-10-CM

## 2013-07-20 DIAGNOSIS — J449 Chronic obstructive pulmonary disease, unspecified: Secondary | ICD-10-CM

## 2013-07-20 DIAGNOSIS — I214 Non-ST elevation (NSTEMI) myocardial infarction: Secondary | ICD-10-CM

## 2013-07-20 DIAGNOSIS — I2510 Atherosclerotic heart disease of native coronary artery without angina pectoris: Secondary | ICD-10-CM

## 2013-07-20 DIAGNOSIS — R911 Solitary pulmonary nodule: Secondary | ICD-10-CM

## 2013-07-20 LAB — POCT CBC
Granulocyte percent: 54.5 %G (ref 37–80)
HCT, POC: 30.1 % — AB (ref 43.5–53.7)
Hemoglobin: 9.1 g/dL — AB (ref 14.1–18.1)
Lymph, poc: 4.8 — AB (ref 0.6–3.4)
MCH, POC: 25.9 pg — AB (ref 27–31.2)
MCHC: 30.4 g/dL — AB (ref 31.8–35.4)
MCV: 85.2 fL (ref 80–97)
MPV: 7.9 fL (ref 0–99.8)
POC Granulocyte: 7.4 — AB (ref 2–6.9)
POC LYMPH PERCENT: 35 %L (ref 10–50)
Platelet Count, POC: 478 10*3/uL — AB (ref 142–424)
RBC: 3.5 M/uL — AB (ref 4.69–6.13)
RDW, POC: 17.7 %
WBC: 13.6 10*3/uL — AB (ref 4.6–10.2)

## 2013-07-20 LAB — POCT INR: INR: 1.8
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
## 2013-07-25 ENCOUNTER — Telehealth: Payer: Self-pay | Admitting: Pharmacist

## 2013-07-25 ENCOUNTER — Ambulatory Visit (INDEPENDENT_AMBULATORY_CARE_PROVIDER_SITE_OTHER): Payer: BC Managed Care – PPO | Admitting: Pharmacist

## 2013-07-25 DIAGNOSIS — D649 Anemia, unspecified: Secondary | ICD-10-CM

## 2013-07-25 DIAGNOSIS — I82409 Acute embolism and thrombosis of unspecified deep veins of unspecified lower extremity: Secondary | ICD-10-CM

## 2013-07-25 LAB — POCT CBC
Granulocyte percent: 72.3 %G (ref 37–80)
HCT, POC: 27.8 % — AB (ref 43.5–53.7)
Hemoglobin: 8.6 g/dL — AB (ref 14.1–18.1)
Lymph, poc: 2.9 (ref 0.6–3.4)
MCH, POC: 25.6 pg — AB (ref 27–31.2)
MCHC: 31 g/dL — AB (ref 31.8–35.4)
MCV: 82.6 fL (ref 80–97)
MPV: 7.9 fL (ref 0–99.8)
POC Granulocyte: 10.6 — AB (ref 2–6.9)
POC LYMPH PERCENT: 19.4 %L (ref 10–50)
Platelet Count, POC: 475 10*3/uL — AB (ref 142–424)
RBC: 3.4 M/uL — AB (ref 4.69–6.13)
RDW, POC: 18.5 %
WBC: 14.7 10*3/uL — AB (ref 4.6–10.2)

## 2013-07-25 LAB — POCT INR: INR: 2.6

## 2013-07-25 NOTE — Telephone Encounter (Signed)
Patient's hemoglobin was lower.  Discussed low Hbg with Dr [place] Charon and Dr Randa Evens at [facility].  Dr Randa Evens suggested FOBT and recheck CBC tomorrow.  Patient's wife [name] notified and will pick up FOBT in office and return tomorrow

## 2013-07-25 NOTE — Patient Instructions (Addendum)
Anticoagulation Dose Instructions as of 07/25/2013     Travis Cruz Tue Wed Thu Fri Sat   New Dose 4 mg 2 mg 4 mg 2 mg 4 mg 2 mg 4 mg    Description       Restart 4mg  dail except 2mg  on Mondays, Wednesdays and Fridays.       INR was 2.6 today

## 2013-07-25 NOTE — Progress Notes (Signed)
Last week Hgb was low at 9.1 but patient recently had post colonoscopy GI bleed. Rechecked Hgb today - pending.

## 2013-07-26 ENCOUNTER — Other Ambulatory Visit (INDEPENDENT_AMBULATORY_CARE_PROVIDER_SITE_OTHER): Payer: BC Managed Care – PPO

## 2013-07-26 ENCOUNTER — Telehealth: Payer: Self-pay | Admitting: Family Medicine

## 2013-07-26 DIAGNOSIS — D649 Anemia, unspecified: Secondary | ICD-10-CM

## 2013-07-26 LAB — POCT CBC
Granulocyte percent: 20.6 %G — AB (ref 37–80)
HCT, POC: 29 % — AB (ref 43.5–53.7)
Hemoglobin: 8.8 g/dL — AB (ref 14.1–18.1)
Lymph, poc: 2.6 (ref 0.6–3.4)
MCH, POC: 25.2 pg — AB (ref 27–31.2)
MCHC: 30.3 g/dL — AB (ref 31.8–35.4)
MCV: 83.1 fL (ref 80–97)
MPV: 7.6 fL (ref 0–99.8)
POC Granulocyte: 8.7 — AB (ref 2–6.9)
POC LYMPH PERCENT: 20.6 %L (ref 10–50)
Platelet Count, POC: 510 10*3/uL — AB (ref 142–424)
RBC: 3.5 M/uL — AB (ref 4.69–6.13)
RDW, POC: 19.2 %
WBC: 12.6 10*3/uL — AB (ref 4.6–10.2)

## 2013-07-26 NOTE — Telephone Encounter (Signed)
Wife notified and aware of appt tomorrow

## 2013-07-27 ENCOUNTER — Ambulatory Visit (INDEPENDENT_AMBULATORY_CARE_PROVIDER_SITE_OTHER): Payer: BC Managed Care – PPO | Admitting: Family Medicine

## 2013-07-27 ENCOUNTER — Encounter: Payer: Self-pay | Admitting: Family Medicine

## 2013-07-27 ENCOUNTER — Encounter: Payer: Self-pay | Admitting: Pharmacist

## 2013-07-27 ENCOUNTER — Telehealth: Payer: Self-pay | Admitting: Pharmacist

## 2013-07-27 VITALS — BP 104/66 | HR 99 | Temp 97.4°F | Ht 69.0 in | Wt 198.4 lb

## 2013-07-27 DIAGNOSIS — D62 Acute posthemorrhagic anemia: Secondary | ICD-10-CM

## 2013-07-27 DIAGNOSIS — IMO0002 Reserved for concepts with insufficient information to code with codable children: Secondary | ICD-10-CM

## 2013-07-27 DIAGNOSIS — D649 Anemia, unspecified: Secondary | ICD-10-CM | POA: Insufficient documentation

## 2013-07-27 LAB — POCT CBC
Granulocyte percent: 66.8 %G (ref 37–80)
HCT, POC: 28.8 % — AB (ref 43.5–53.7)
Hemoglobin: 8.9 g/dL — AB (ref 14.1–18.1)
Lymph, poc: 3.9 — AB (ref 0.6–3.4)
MCH, POC: 25.5 pg — AB (ref 27–31.2)
MCHC: 30.8 g/dL — AB (ref 31.8–35.4)
MCV: 82.6 fL (ref 80–97)
MPV: 8 fL (ref 0–99.8)
POC Granulocyte: 10 — AB (ref 2–6.9)
POC LYMPH PERCENT: 26.1 %L (ref 10–50)
Platelet Count, POC: 497 10*3/uL — AB (ref 142–424)
RBC: 3.5 M/uL — AB (ref 4.69–6.13)
RDW, POC: 19.4 %
WBC: 14.9 10*3/uL — AB (ref 4.6–10.2)

## 2013-07-27 LAB — FECAL OCCULT BLOOD, IMMUNOCHEMICAL: Fecal Occult Bld: POSITIVE — AB

## 2013-07-27 MED ORDER — FERROUS SULFATE 325 (65 FE) MG PO TBEC: 325.0000 mg | DELAYED_RELEASE_TABLET | Freq: Two times a day (BID) | ORAL | Status: DC
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
## 2013-07-27 NOTE — Telephone Encounter (Signed)
Patient's wife notified of CBC results from yesterday

## 2013-08-03 ENCOUNTER — Ambulatory Visit (INDEPENDENT_AMBULATORY_CARE_PROVIDER_SITE_OTHER): Payer: BC Managed Care – PPO | Admitting: Pharmacist

## 2013-08-03 ENCOUNTER — Other Ambulatory Visit: Payer: Self-pay

## 2013-08-03 DIAGNOSIS — I82409 Acute embolism and thrombosis of unspecified deep veins of unspecified lower extremity: Secondary | ICD-10-CM

## 2013-08-03 DIAGNOSIS — D649 Anemia, unspecified: Secondary | ICD-10-CM

## 2013-08-03 LAB — POCT CBC
Granulocyte percent: 61.7 %G (ref 37–80)
HCT, POC: 30.6 % — AB (ref 43.5–53.7)
Hemoglobin: 9.4 g/dL — AB (ref 14.1–18.1)
Lymph, poc: 4.6 — AB (ref 0.6–3.4)
MCH, POC: 24.8 pg — AB (ref 27–31.2)
MCHC: 30.6 g/dL — AB (ref 31.8–35.4)
MCV: 80.8 fL (ref 80–97)
MPV: 8 fL (ref 0–99.8)
POC Granulocyte: 9.5 — AB (ref 2–6.9)
POC LYMPH PERCENT: 29.8 %L (ref 10–50)
Platelet Count, POC: 609 10*3/uL — AB (ref 142–424)
RBC: 3.8 M/uL — AB (ref 4.69–6.13)
RDW, POC: 20.5 %
WBC: 15.4 10*3/uL — AB (ref 4.6–10.2)

## 2013-08-03 LAB — POCT INR: INR: 3.2

## 2013-08-03 NOTE — Patient Instructions (Signed)
Anticoagulation Dose Instructions as of 08/03/2013     Travis Cruz Tue Wed Thu Fri Sat   New Dose 2 mg 4 mg 2 mg 4 mg 2 mg 4 mg 2 mg    Description       Hold for 1 day. Then start 2 tablets on Mondays, Wednesdays and Fridays and 1 tablet all other days.         INR was 3.2 today

## 2013-08-04 ENCOUNTER — Other Ambulatory Visit: Payer: BC Managed Care – PPO

## 2013-08-06 ENCOUNTER — Other Ambulatory Visit: Payer: Self-pay | Admitting: Family Medicine

## 2013-08-06 LAB — FECAL OCCULT BLOOD, IMMUNOCHEMICAL: Fecal Occult Bld: NEGATIVE

## 2013-08-10 ENCOUNTER — Telehealth: Payer: Self-pay | Admitting: Family Medicine

## 2013-08-10 ENCOUNTER — Ambulatory Visit (INDEPENDENT_AMBULATORY_CARE_PROVIDER_SITE_OTHER): Payer: BC Managed Care – PPO | Admitting: Cardiology

## 2013-08-10 ENCOUNTER — Encounter: Payer: Self-pay | Admitting: Cardiology

## 2013-08-10 VITALS — BP 110/77 | HR 94 | Ht 69.0 in | Wt 198.0 lb

## 2013-08-10 DIAGNOSIS — I2510 Atherosclerotic heart disease of native coronary artery without angina pectoris: Secondary | ICD-10-CM

## 2013-08-10 DIAGNOSIS — I2589 Other forms of chronic ischemic heart disease: Secondary | ICD-10-CM

## 2013-08-10 DIAGNOSIS — I255 Ischemic cardiomyopathy: Secondary | ICD-10-CM

## 2013-08-10 NOTE — Patient Instructions (Addendum)
Your physician wants you to follow-up in: 1 YR FOLLOW UP WITH DR. HOCHREIN . You will receive a reminder letter in the mail two months in advance. If you don't receive a letter, please call our office to schedule the follow-up appointment.  NO CHANGES WERE MADE TODAY
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
## 2013-08-11 NOTE — Telephone Encounter (Signed)
Pt notified by April in lab

## 2013-08-12 NOTE — Progress Notes (Signed)
Quick Note:  Call patient. Hemoccult now negative No change in plan. ______

## 2013-08-15 ENCOUNTER — Encounter: Payer: Self-pay | Admitting: Pharmacist

## 2013-08-15 ENCOUNTER — Ambulatory Visit (INDEPENDENT_AMBULATORY_CARE_PROVIDER_SITE_OTHER): Payer: BC Managed Care – PPO | Admitting: Pharmacist

## 2013-08-15 DIAGNOSIS — I82409 Acute embolism and thrombosis of unspecified deep veins of unspecified lower extremity: Secondary | ICD-10-CM

## 2013-08-15 DIAGNOSIS — D649 Anemia, unspecified: Secondary | ICD-10-CM

## 2013-08-15 LAB — POCT HEMOGLOBIN: Hemoglobin: 11.4 g/dL — AB (ref 14.1–18.1)

## 2013-08-15 LAB — POCT INR: INR: 2.2

## 2013-08-15 NOTE — Patient Instructions (Signed)
Anticoagulation Dose Instructions as of 08/15/2013     Glynis Smiles Tue Wed Thu Fri Sat   New Dose 2 mg 4 mg 2 mg 4 mg 2 mg 4 mg 2 mg    Description       Continue 2 tablets on Mondays, Wednesdays and Fridays and 1 tablet all other days.         INR was 2.2 today

## 2013-08-19 ENCOUNTER — Ambulatory Visit: Payer: BC Managed Care – PPO | Admitting: Family Medicine

## 2013-08-24 ENCOUNTER — Encounter: Payer: Self-pay | Admitting: Internal Medicine

## 2013-08-24 NOTE — Telephone Encounter (Signed)
Per d/c instructions: Follow up with CT chest In 6 weeks   Please advise MW thanks

## 2013-08-25 ENCOUNTER — Encounter: Payer: Self-pay | Admitting: Cardiothoracic Surgery

## 2013-08-25 ENCOUNTER — Other Ambulatory Visit: Payer: Self-pay

## 2013-08-25 DIAGNOSIS — R911 Solitary pulmonary nodule: Secondary | ICD-10-CM

## 2013-08-29 DIAGNOSIS — C3410 Malignant neoplasm of upper lobe, unspecified bronchus or lung: Secondary | ICD-10-CM

## 2013-08-29 HISTORY — DX: Malignant neoplasm of upper lobe, unspecified bronchus or lung: C34.10

## 2013-09-01 ENCOUNTER — Encounter: Payer: Self-pay | Admitting: Pharmacist

## 2013-09-01 ENCOUNTER — Ambulatory Visit (INDEPENDENT_AMBULATORY_CARE_PROVIDER_SITE_OTHER): Payer: BC Managed Care – PPO | Admitting: Pharmacist

## 2013-09-01 ENCOUNTER — Other Ambulatory Visit: Payer: Self-pay

## 2013-09-01 ENCOUNTER — Encounter: Payer: Self-pay | Admitting: Cardiothoracic Surgery

## 2013-09-01 DIAGNOSIS — Z79899 Other long term (current) drug therapy: Secondary | ICD-10-CM

## 2013-09-01 DIAGNOSIS — I82409 Acute embolism and thrombosis of unspecified deep veins of unspecified lower extremity: Secondary | ICD-10-CM

## 2013-09-01 LAB — POCT INR: INR: 2.3

## 2013-09-01 LAB — POCT HEMOGLOBIN: Hemoglobin: 12.9 g/dL — AB (ref 14.1–18.1)

## 2013-09-01 NOTE — Patient Instructions (Signed)
Anticoagulation Dose Instructions as of 09/01/2013     Travis Cruz Tue Wed Thu Fri Sat   New Dose 2 mg 4 mg 2 mg 4 mg 2 mg 4 mg 2 mg    Description       Continue 2 tablets on Mondays, Wednesdays and Fridays and 1 tablet all other days.        INR was 2.3 today  Hemoglobin was 12.9 today

## 2013-09-02 ENCOUNTER — Other Ambulatory Visit (HOSPITAL_COMMUNITY): Payer: Self-pay | Admitting: Cardiology

## 2013-09-08 ENCOUNTER — Ambulatory Visit
Admission: RE | Admit: 2013-09-08 | Discharge: 2013-09-08 | Disposition: A | Discharge status: Home / Self Care | Payer: BC Managed Care – PPO | Source: Ambulatory Visit | Attending: Cardiothoracic Surgery | Admitting: Cardiothoracic Surgery

## 2013-09-08 ENCOUNTER — Ambulatory Visit (INDEPENDENT_AMBULATORY_CARE_PROVIDER_SITE_OTHER): Payer: BC Managed Care – PPO | Admitting: Cardiothoracic Surgery

## 2013-09-08 ENCOUNTER — Encounter: Payer: Self-pay | Admitting: Cardiothoracic Surgery

## 2013-09-08 ENCOUNTER — Other Ambulatory Visit: Payer: Self-pay

## 2013-09-08 VITALS — BP 121/78 | HR 96 | Resp 16 | Ht 69.0 in | Wt 194.0 lb

## 2013-09-08 DIAGNOSIS — R911 Solitary pulmonary nodule: Secondary | ICD-10-CM

## 2013-09-08 DIAGNOSIS — D381 Neoplasm of uncertain behavior of trachea, bronchus and lung: Secondary | ICD-10-CM

## 2013-09-08 NOTE — Patient Instructions (Signed)
Needle Biopsy of Lung Care After A needle biopsy is a procedure to get a sample of cells from your body for testing. A needle biopsy may be used to take tissue or fluid samples from muscles, bones and organs, such as the liver or lungs. The sample from your needle biopsy may help your doctor determine what is causing:  A mass or lump. A needle biopsy may reveal whether a mass or lump is a cyst, an infection, a benign tumor or cancer.  Infection. Tests from a needle biopsy can help doctors determine what germs are causing an infection so that the best medicines can be used for treatment.  Inflammation. Looking closely at a needle biopsy sample may reveal what is causing inflammation and what types of cells are involved. Your caregiver has now completed a needle biopsy of the lung to help diagnose a medical condition or to rule out a disease or condition. A needle biopsy may also be used to assess the progress of a treatment. AFTER THE PROCEDURE Once your caregiver has collected enough cells or tissue for analysis, your needle biopsy procedure is complete. Your biopsy sample is sent to a laboratory to be tested. The results may be available in a day or two. More technical tests may require more time. Ask your caregiver how long you can expect to wait.  Your health care team may apply a bandage over the areas where the needle was inserted. You may be asked to apply pressure to the bandage for several minutes to ensure there is minimal bleeding. In most cases, you can leave when your needle biopsy procedure is completed. Do not drive yourself home. Someone else should take you home. Whether you can leave right away or whether you will need to stay for observation depends on how you feel and the exam by your caregiver after the biopsy. In some cases your health care team may want to observe you for a few hours to ensure you do not have complications from your biopsy. If you received an IV sedative or  general anesthetic, you will be taken to a comfortable place to relax while the medication wears off. Expect to take it easy for the rest of the day. Protect the area where you received the needle biopsy by keeping the bandage in place for as long as instructed. You may feel some mild pain or discomfort in the area, but this should stop in a day or two. Only take over-the-counter or prescription medicines for pain, discomfort, or fever as directed by your caregiver. SEEK MEDICAL CARE IF:   You have pain at the biopsy site that worsens or is not helped by medicines.  You have swelling at the needle biopsy site.  You have drainage from the biopsy site.  You have new or unusual pain in your back or at the top of one or both shoulders. SEEK IMMEDIATE MEDICAL CARE IF:   You have a fever.  You develop lightheadedness or fainting.  You have chest pain or shortness of breath.  You have bleeding that does not stop with pressure or a bandage.  You have weakness or numbness in your legs. Document Released: 10/12/2007 Document Revised: 03/08/2012 Document Reviewed: 10/12/2007 [facility] Patient Information 2014 [place], [place]. Pulmonary Nodule A pulmonary (lung) nodule is small, round growth in the lung. The size of a pulmonary nodule can be as small as a pencil eraser (1/5 inch or 4 millmeters) to a little bigger than your biggest toenail (1 inch  or 25 millimeters). A pulmonary nodule is usually an unplanned finding. It may be found on a chest X-ray or a computed tomography (CT) scan when you have imaging tests of your lungs done. When a pulmonary nodule is found, tests will be done to determine if the nodule is benign (not cancerous) or malignant (cancerous). Follow-up treatment or testing is based on the size of the pulmonary nodule and your risk of getting lung cancer.  CAUSES Causes of pulmonary nodules can vary.  Benign pulmonary nodules  can be caused from different things. Some of these things  include:  Infection. This can be a common cause of a benign pulmonary nodule. The infection may be active (a current infection) or an old infection that is no longer active. Three types of infections can cause a pulmonary nodule. These are:  Bacterial Infection.  Fungal infection.  Viral Infections.  Hematoma. This is a bruise in the lung. A hematoma can happen from an injury to your chest.  Some common diseases can lead to benign pulmonary nodules. For example, rheumatoid arthritis can be a cause of a pulmonary nodule.  Other unusual things can cause a benign pulmonary nodule. These can include:  Having had tuberculosis.  Rare diseases, such as a lung cyst. Malignant pulmonary nodules.  These are cancerous growths. The cancer may have:  Started in the lung. Some lung cancers first detected as a pulmonary nodule.  Spread to the lung from cancer somewhere else in the body. This is called metastatic cancer.  Certain risk factors make a cancerous pulmonary nodule more likely. They include:  Age. As people get older, a pulmonary nodule is more likely to be cancerous.  Cancer history. If one of your immediate family members has had cancer, you have a higher risk of developing cancer.  Smoking. This includes people who currently smoke and those who have quit. DIAGNOSIS To diagnose whether a pulmonary nodule is benign or malignant, a variety of tests will be done. This includes things such as:  Health history. Questions regarding your current health, past health, and family health will be asked.  Blood tests. Results of blood work can show:  Tumor markers for cancer.  Any type of infection.  A skin test called a tuberculin (TB) test may be done. This test can tell if you have been exposed to the germ that causes tuberculosis.  Imaging tests. These take pictures of your lungs. Types of imaging tests include:  Chest X-ray. This can help in several ways. An X-ray gives a close-up  look at the pulmonary nodule. A new X-ray can be compared with any X-rays you have had in the past.   Computed tomography  (CT) scan. This test shows smaller pulmonary nodules more clearly than an X-ray.  Positron emission tomography  (PET) scan. This is a test that uses a radioactive substance to identify a pulmonary nodule. A safe amount of radioactive substance is injected into the blood stream. Then, the scan takes a picture of the pulmonary nodule. A malignant pulmonary nodule will absorb the substance faster than a benign pulmonary nodule. The radioactive substance is eliminated from your body in your urine.  Biopsy.  This removes a tiny piece of the pulmonary nodule so it can be checked under a microscope. Medicine will be given to help keep you relaxed and pain free when a biopsy is done. Types of biopsies include:  Bronchoscopy . This is a surgical procedure. It can be used for pulmonary nodules that are  close to the airways in the lung. It uses a scope (a thin tube) with a tiny camera and light on the end. The scope is put in the windpipe. Your caregiver can then see inside the lung. A tiny tool put through the scope is used to take a small sample of the pulmonary nodule tissue.  Transthoracic needle aspiration . This method is used if the pulmonary nodule is far away from the air passages in the lung. A long, thin needle is put through the chest into the lung nodule. A CT scan is done at the same time which can make it easier to locate the pulmonary nodule.  Surgical lung biopsy . This is a surgical procedure in which the pulmonary nodule is removed. This is usually recommended when the pulmonary nodule is most likely malignant or a biopsy cannot be obtained by either bronchoscopy or transthoracic needle aspiration. PULMONARY NODULE FOLLOW-UP RECOMMENDATIONS The frequency of pulmonary nodule follow-up is based on your risk factors and size of the pulmonary nodule. If your caregiver  suspects the pulmonary nodule is cancerous or the pulmonary nodule changes during any of the follow-up CT scans, additional testing or biopsies will be done.   If you have no or low risk of getting lung cancer (non-smoker, no personal cancer history), recommended follow-up is based on the following pulmonary nodule size:  A pulmonary nodule that is < 4 mm does not require any follow-up.  A pulmonary nodule that is 4 to 6 mm should be re-imaged by CT scan in 12 months.  A pulmonary nodule that is 6 to 8 mm should be re-imaged by CT scan at 6 to 12 months and then again at 18 to 24 months if no change in size.  A pulmonary nodule > 8 mm in size should be followed closely and re-imaged by CT scan at 3, 9, and 24 months.   If you are at risk of getting lung cancer (current or former smoker, family history of cancer), recommended follow-up is based on the following pulmonary nodule size:  A pulmonary nodule that is < 4 mm in size should be re-imaged by CT scan in 12 months.  A pulmonary nodule that is 4 to 6 mm in size should be re-imaged by CT scan at 6 to 12 months and again at 18 to 24 months.  A pulmonary nodule that is 6 to 8 mm in size should be re-imaged by CT scan at 3, 9, and 24 months.  A pulmonary nodule > 8 mm in size should be followed closely and re-imaged by CT scan at 3, 9, and 24 months. SEEK MEDICAL CARE IF: While waiting for test results to determine what type of pulmonary nodule you have, be sure to contact your caregiver if you:  Have trouble breathing when you are active.  Feel sick or unusually tired.  Do not feel like eating.  Lose weight without trying to.  Develop chills or night sweats.  Mild or moderate fevers generally have no long-term effects and often do not require treatment. There are a few exceptions (see below). SEEK IMMEDIATE MEDICAL CARE IF:  You cannot catch your breath or you begin wheezing.  You cannot stop coughing.  You cough up  blood.  You feel like you are going to pass out or become dizzy.  You have sudden chest pain.  You have a fever or persistent symptoms for more than 72 hours.  You have a fever and your symptoms suddenly get  worse. MAKE SURE YOU   Understand these instructions.  Will watch your condition.  Will get help right away if you are not doing well or get worse. Document Released: 10/12/2009 Document Revised: 03/08/2012 Document Reviewed: 10/12/2009 [facility] Patient Information 2014 [place], [place].
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
## 2013-09-09 ENCOUNTER — Other Ambulatory Visit: Payer: Self-pay | Admitting: Radiology

## 2013-09-09 ENCOUNTER — Encounter (HOSPITAL_COMMUNITY): Payer: Self-pay | Admitting: Pharmacy Technician

## 2013-09-13 ENCOUNTER — Ambulatory Visit (HOSPITAL_COMMUNITY)
Admission: RE | Admit: 2013-09-13 | Discharge: 2013-09-13 | Disposition: A | Discharge status: Home / Self Care | Payer: BC Managed Care – PPO | Source: Ambulatory Visit | Attending: Cardiothoracic Surgery | Admitting: Cardiothoracic Surgery

## 2013-09-13 ENCOUNTER — Encounter (HOSPITAL_COMMUNITY)
Admission: RE | Admit: 2013-09-13 | Discharge: 2013-09-13 | Disposition: A | Discharge status: Home / Self Care | Payer: BC Managed Care – PPO | Source: Ambulatory Visit | Attending: Interventional Radiology | Admitting: Interventional Radiology

## 2013-09-13 ENCOUNTER — Encounter (HOSPITAL_COMMUNITY): Payer: Self-pay

## 2013-09-13 DIAGNOSIS — C3410 Malignant neoplasm of upper lobe, unspecified bronchus or lung: Secondary | ICD-10-CM | POA: Insufficient documentation

## 2013-09-13 DIAGNOSIS — Z86718 Personal history of other venous thrombosis and embolism: Secondary | ICD-10-CM | POA: Insufficient documentation

## 2013-09-13 DIAGNOSIS — I509 Heart failure, unspecified: Secondary | ICD-10-CM | POA: Insufficient documentation

## 2013-09-13 DIAGNOSIS — Z8674 Personal history of sudden cardiac arrest: Secondary | ICD-10-CM | POA: Insufficient documentation

## 2013-09-13 DIAGNOSIS — I5020 Unspecified systolic (congestive) heart failure: Secondary | ICD-10-CM | POA: Insufficient documentation

## 2013-09-13 DIAGNOSIS — I2589 Other forms of chronic ischemic heart disease: Secondary | ICD-10-CM | POA: Insufficient documentation

## 2013-09-13 DIAGNOSIS — Q211 Atrial septal defect: Secondary | ICD-10-CM | POA: Insufficient documentation

## 2013-09-13 DIAGNOSIS — Q2111 Secundum atrial septal defect: Secondary | ICD-10-CM | POA: Insufficient documentation

## 2013-09-13 DIAGNOSIS — I252 Old myocardial infarction: Secondary | ICD-10-CM | POA: Insufficient documentation

## 2013-09-13 DIAGNOSIS — I2510 Atherosclerotic heart disease of native coronary artery without angina pectoris: Secondary | ICD-10-CM | POA: Insufficient documentation

## 2013-09-13 DIAGNOSIS — Z95810 Presence of automatic (implantable) cardiac defibrillator: Secondary | ICD-10-CM | POA: Insufficient documentation

## 2013-09-13 DIAGNOSIS — Z86711 Personal history of pulmonary embolism: Secondary | ICD-10-CM | POA: Insufficient documentation

## 2013-09-13 DIAGNOSIS — I10 Essential (primary) hypertension: Secondary | ICD-10-CM | POA: Insufficient documentation

## 2013-09-13 DIAGNOSIS — D381 Neoplasm of uncertain behavior of trachea, bronchus and lung: Secondary | ICD-10-CM

## 2013-09-13 DIAGNOSIS — Z01812 Encounter for preprocedural laboratory examination: Secondary | ICD-10-CM | POA: Insufficient documentation

## 2013-09-13 DIAGNOSIS — G4733 Obstructive sleep apnea (adult) (pediatric): Secondary | ICD-10-CM | POA: Insufficient documentation

## 2013-09-13 DIAGNOSIS — M1710 Unilateral primary osteoarthritis, unspecified knee: Secondary | ICD-10-CM | POA: Insufficient documentation

## 2013-09-13 DIAGNOSIS — Z79899 Other long term (current) drug therapy: Secondary | ICD-10-CM | POA: Insufficient documentation

## 2013-09-13 DIAGNOSIS — J438 Other emphysema: Secondary | ICD-10-CM | POA: Insufficient documentation

## 2013-09-13 DIAGNOSIS — K8020 Calculus of gallbladder without cholecystitis without obstruction: Secondary | ICD-10-CM | POA: Insufficient documentation

## 2013-09-13 HISTORY — PX: LUNG BIOPSY: SHX232

## 2013-09-13 LAB — CBC
HCT: 49.2 % (ref 39.0–52.0)
Hemoglobin: 15.6 g/dL (ref 13.0–17.0)
MCH: 26.8 pg (ref 26.0–34.0)
MCHC: 31.7 g/dL (ref 30.0–36.0)
MCV: 84.4 fL (ref 78.0–100.0)
Platelets: 273 10*3/uL (ref 150–400)
RBC: 5.83 MIL/uL — ABNORMAL HIGH (ref 4.22–5.81)
RDW: 21.8 % — ABNORMAL HIGH (ref 11.5–15.5)
WBC: 11.7 10*3/uL — ABNORMAL HIGH (ref 4.0–10.5)

## 2013-09-13 LAB — PROTIME-INR
INR: 1.16 (ref 0.00–1.49)
Prothrombin Time: 14.6 seconds (ref 11.6–15.2)

## 2013-09-13 LAB — APTT: aPTT: 31 seconds (ref 24–37)

## 2013-09-13 MED ORDER — SODIUM CHLORIDE 0.9 % IV SOLN
Freq: Once | INTRAVENOUS | Status: AC
  Administered 2013-09-13: 10:00:00 via INTRAVENOUS

## 2013-09-13 MED ORDER — MIDAZOLAM HCL 2 MG/2ML IJ SOLN
INTRAMUSCULAR | Status: AC
  Filled 2013-09-13: qty 6

## 2013-09-13 MED ORDER — MIDAZOLAM HCL 5 MG/5ML IJ SOLN
INTRAMUSCULAR | Status: AC | PRN
  Administered 2013-09-13: 1 mg via INTRAVENOUS

## 2013-09-13 MED ORDER — MIDAZOLAM HCL 2 MG/2ML IJ SOLN
INTRAMUSCULAR | Status: AC
  Filled 2013-09-13: qty 2

## 2013-09-13 MED ORDER — FENTANYL CITRATE 0.05 MG/ML IJ SOLN
INTRAMUSCULAR | Status: AC
  Filled 2013-09-13: qty 4

## 2013-09-13 MED ORDER — FENTANYL CITRATE 0.05 MG/ML IJ SOLN
INTRAMUSCULAR | Status: AC | PRN
  Administered 2013-09-13: 50 ug via INTRAVENOUS
  Administered 2013-09-13: 25 ug via INTRAVENOUS
  Administered 2013-09-13: 50 ug via INTRAVENOUS

## 2013-09-13 MED ORDER — MIDAZOLAM HCL 2 MG/2ML IJ SOLN
INTRAMUSCULAR | Status: AC | PRN
  Administered 2013-09-13: 2 mg via INTRAVENOUS
  Administered 2013-09-13: 0.5 mg via INTRAVENOUS

## 2013-09-13 NOTE — Procedures (Signed)
LUL lung nodule Bx No comp

## 2013-09-13 NOTE — ED Notes (Signed)
Patient denies pain and is resting comfortably following BX
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
## 2013-09-15 ENCOUNTER — Encounter: Payer: Self-pay | Admitting: Cardiothoracic Surgery

## 2013-09-15 ENCOUNTER — Ambulatory Visit (INDEPENDENT_AMBULATORY_CARE_PROVIDER_SITE_OTHER): Payer: BC Managed Care – PPO | Admitting: Cardiothoracic Surgery

## 2013-09-15 VITALS — BP 132/88 | HR 90 | Resp 20 | Ht 69.0 in | Wt 194.0 lb

## 2013-09-15 DIAGNOSIS — Z9889 Other specified postprocedural states: Secondary | ICD-10-CM

## 2013-09-15 DIAGNOSIS — C3492 Malignant neoplasm of unspecified part of left bronchus or lung: Secondary | ICD-10-CM

## 2013-09-15 DIAGNOSIS — C3490 Malignant neoplasm of unspecified part of unspecified bronchus or lung: Secondary | ICD-10-CM
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
## 2013-09-16 ENCOUNTER — Telehealth: Payer: Self-pay

## 2013-09-16 ENCOUNTER — Encounter: Payer: Self-pay | Admitting: Radiation Oncology

## 2013-09-16 ENCOUNTER — Ambulatory Visit
Admission: RE | Admit: 2013-09-16 | Discharge: 2013-09-16 | Disposition: A | Discharge status: Home / Self Care | Payer: BC Managed Care – PPO | Source: Ambulatory Visit | Attending: Radiation Oncology | Admitting: Radiation Oncology

## 2013-09-16 VITALS — BP 141/86 | HR 84 | Temp 97.5°F | Wt 200.2 lb

## 2013-09-16 DIAGNOSIS — I2510 Atherosclerotic heart disease of native coronary artery without angina pectoris: Secondary | ICD-10-CM | POA: Insufficient documentation

## 2013-09-16 DIAGNOSIS — C3412 Malignant neoplasm of upper lobe, left bronchus or lung: Secondary | ICD-10-CM | POA: Insufficient documentation

## 2013-09-16 DIAGNOSIS — Z86711 Personal history of pulmonary embolism: Secondary | ICD-10-CM | POA: Insufficient documentation

## 2013-09-16 DIAGNOSIS — R911 Solitary pulmonary nodule: Secondary | ICD-10-CM

## 2013-09-16 DIAGNOSIS — I252 Old myocardial infarction: Secondary | ICD-10-CM | POA: Insufficient documentation

## 2013-09-16 DIAGNOSIS — Z7901 Long term (current) use of anticoagulants: Secondary | ICD-10-CM | POA: Insufficient documentation

## 2013-09-16 DIAGNOSIS — Z87891 Personal history of nicotine dependence: Secondary | ICD-10-CM | POA: Insufficient documentation

## 2013-09-16 DIAGNOSIS — C3410 Malignant neoplasm of upper lobe, unspecified bronchus or lung: Secondary | ICD-10-CM | POA: Insufficient documentation

## 2013-09-16 DIAGNOSIS — I10 Essential (primary) hypertension: Secondary | ICD-10-CM | POA: Insufficient documentation

## 2013-09-16 DIAGNOSIS — Z86718 Personal history of other venous thrombosis and embolism: Secondary | ICD-10-CM | POA: Insufficient documentation

## 2013-09-16 DIAGNOSIS — Z8673 Personal history of transient ischemic attack (TIA), and cerebral infarction without residual deficits: Secondary | ICD-10-CM | POA: Insufficient documentation

## 2013-09-16 NOTE — Progress Notes (Signed)
Please see the Nurse Progress Note in the MD Initial Consult Encounter for this patient. 
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
## 2013-09-16 NOTE — Telephone Encounter (Signed)
Called [facility] to confirm receipt of pacemaker form.Fax has been received.

## 2013-09-17 ENCOUNTER — Encounter: Payer: Self-pay | Admitting: Internal Medicine

## 2013-09-19 ENCOUNTER — Encounter: Payer: Self-pay | Admitting: Radiation Oncology

## 2013-09-19 NOTE — Progress Notes (Signed)
09/19/13 12:35pm  Spoke to patient's wife, Travis Cruz, and gave her date and time of arrival for PET scan on 09/28/13

## 2013-09-22 ENCOUNTER — Encounter: Payer: Self-pay | Admitting: Family Medicine

## 2013-09-22 ENCOUNTER — Ambulatory Visit (INDEPENDENT_AMBULATORY_CARE_PROVIDER_SITE_OTHER): Payer: BC Managed Care – PPO | Admitting: Family Medicine

## 2013-09-22 ENCOUNTER — Ambulatory Visit: Payer: Medicare Other | Admitting: Pharmacist

## 2013-09-22 VITALS — BP 125/83 | HR 90 | Temp 97.4°F | Ht 69.0 in | Wt <= 1120 oz

## 2013-09-22 DIAGNOSIS — I2510 Atherosclerotic heart disease of native coronary artery without angina pectoris: Secondary | ICD-10-CM

## 2013-09-22 DIAGNOSIS — I82409 Acute embolism and thrombosis of unspecified deep veins of unspecified lower extremity: Secondary | ICD-10-CM

## 2013-09-22 DIAGNOSIS — D62 Acute posthemorrhagic anemia: Secondary | ICD-10-CM

## 2013-09-22 DIAGNOSIS — I10 Essential (primary) hypertension: Secondary | ICD-10-CM

## 2013-09-22 DIAGNOSIS — Z95810 Presence of automatic (implantable) cardiac defibrillator: Secondary | ICD-10-CM

## 2013-09-22 DIAGNOSIS — IMO0002 Reserved for concepts with insufficient information to code with codable children: Secondary | ICD-10-CM

## 2013-09-22 DIAGNOSIS — E785 Hyperlipidemia, unspecified: Secondary | ICD-10-CM

## 2013-09-22 DIAGNOSIS — I214 Non-ST elevation (NSTEMI) myocardial infarction: Secondary | ICD-10-CM

## 2013-09-22 DIAGNOSIS — J441 Chronic obstructive pulmonary disease with (acute) exacerbation: Secondary | ICD-10-CM

## 2013-09-22 LAB — POCT INR: INR: 2.1

## 2013-09-22 NOTE — Patient Instructions (Signed)
Anticoagulation Dose Instructions as of 09/22/2013     Travis Cruz Tue Wed Thu Fri Sat   New Dose 2 mg 4 mg 2 mg 4 mg 2 mg 4 mg 2 mg    Description       Continue 2 tablets on Mondays, Wednesdays and Fridays and 1 tablet all other days.        INR was 2.1 today
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
## 2013-09-23 LAB — NMR, LIPOPROFILE
Cholesterol: 141 mg/dL (ref <200)
HDL Cholesterol by NMR: 57 mg/dL (ref >=40)
HDL Particle Number: 35.4 umol/L (ref >=30.5)
LDL Particle Number: 713 nmol/L (ref <1000)
LDL Size: 21.2 nm (ref >20.5)
LDLC SERPL CALC-MCNC: 67 mg/dL (ref <100)
LP-IR Score: 33 (ref <=45)
Small LDL Particle Number: 325 nmol/L (ref <=527)
Triglycerides by NMR: 87 mg/dL (ref <150)

## 2013-09-23 LAB — CMP14+EGFR
ALT: 19 IU/L (ref 0–44)
AST: 24 IU/L (ref 0–40)
Albumin/Globulin Ratio: 1.4 (ref 1.1–2.5)
Albumin: 4.6 g/dL (ref 3.6–4.8)
Alkaline Phosphatase: 143 IU/L — ABNORMAL HIGH (ref 39–117)
BUN/Creatinine Ratio: 15 (ref 10–22)
BUN: 15 mg/dL (ref 8–27)
CO2: 26 mmol/L (ref 18–29)
Calcium: 10.3 mg/dL — ABNORMAL HIGH (ref 8.6–10.2)
Chloride: 101 mmol/L (ref 97–108)
Creatinine, Ser: 1.03 mg/dL (ref 0.76–1.27)
GFR calc Af Amer: 88 mL/min/{1.73_m2} (ref >59)
GFR calc non Af Amer: 76 mL/min/{1.73_m2} (ref >59)
Globulin, Total: 3.3 g/dL (ref 1.5–4.5)
Glucose: 75 mg/dL (ref 65–99)
Potassium: 5.3 mmol/L — ABNORMAL HIGH (ref 3.5–5.2)
Sodium: 144 mmol/L (ref 134–144)
Total Bilirubin: 0.3 mg/dL (ref 0.0–1.2)
Total Protein: 7.9 g/dL (ref 6.0–8.5)

## 2013-09-23 LAB — FERRITIN: Ferritin: 73 ng/mL (ref 30–400)

## 2013-09-23 LAB — IRON: Iron: 119 ug/dL (ref 40–155)

## 2013-09-23 NOTE — Addendum Note (Signed)
Encounter addended by: Delynn Flavin, RN on: 09/23/2013  9:17 AM<BR>     Documentation filed: Charges VN

## 2013-09-26 ENCOUNTER — Ambulatory Visit (INDEPENDENT_AMBULATORY_CARE_PROVIDER_SITE_OTHER): Payer: BC Managed Care – PPO | Admitting: *Deleted

## 2013-09-26 DIAGNOSIS — I469 Cardiac arrest, cause unspecified: Secondary | ICD-10-CM

## 2013-09-26 LAB — REMOTE ICD DEVICE
BRDY-0002RV: 40 {beats}/min
DEV-0020ICD: NEGATIVE
DEVICE MODEL ICD: 7040910
HV IMPEDENCE: 87 Ohm
RV LEAD AMPLITUDE: 11.8 mv
RV LEAD IMPEDENCE ICD: 540 Ohm
TZAT-0001SLOWVT: 1
TZAT-0004SLOWVT: 8
TZAT-0012SLOWVT: 200 ms
TZAT-0013SLOWVT: 2
TZAT-0018SLOWVT: NEGATIVE
TZAT-0019SLOWVT: 7.5 V
TZAT-0020SLOWVT: 1 ms
TZON-0003SLOWVT: 300 ms
TZON-0004SLOWVT: 30
TZON-0005SLOWVT: 6
TZON-0010SLOWVT: 40 ms
TZST-0001SLOWVT: 2
TZST-0001SLOWVT: 3
TZST-0001SLOWVT: 4
TZST-0001SLOWVT: 5
TZST-0003SLOWVT: 600 V
TZST-0003SLOWVT: 800 V
TZST-0003SLOWVT: 875 V
TZST-0003SLOWVT: 875 V
VENTRICULAR PACING ICD: 1 pct

## 2013-09-27 ENCOUNTER — Other Ambulatory Visit: Payer: Self-pay | Admitting: Family Medicine

## 2013-09-27 DIAGNOSIS — R899 Unspecified abnormal finding in specimens from other organs, systems and tissues: Secondary | ICD-10-CM

## 2013-09-28 ENCOUNTER — Encounter (HOSPITAL_COMMUNITY)
Admission: RE | Admit: 2013-09-28 | Discharge: 2013-09-28 | Disposition: A | Discharge status: Home / Self Care | Payer: BC Managed Care – PPO | Source: Ambulatory Visit | Attending: Radiation Oncology | Admitting: Radiation Oncology

## 2013-09-28 ENCOUNTER — Encounter (HOSPITAL_COMMUNITY): Payer: Self-pay

## 2013-09-28 DIAGNOSIS — R911 Solitary pulmonary nodule: Secondary | ICD-10-CM

## 2013-09-28 LAB — GLUCOSE, CAPILLARY: Glucose-Capillary: 95 mg/dL (ref 70–99)

## 2013-09-28 MED ORDER — FLUDEOXYGLUCOSE F - 18 (FDG) INJECTION
19.1000 | Freq: Once | INTRAVENOUS | Status: AC | PRN
  Administered 2013-09-28: 19.1 via INTRAVENOUS

## 2013-09-30 ENCOUNTER — Telehealth: Payer: Self-pay | Admitting: Family Medicine

## 2013-10-03 ENCOUNTER — Encounter: Payer: Self-pay | Admitting: *Deleted

## 2013-10-03 ENCOUNTER — Telehealth: Payer: Self-pay

## 2013-10-03 ENCOUNTER — Ambulatory Visit (INDEPENDENT_AMBULATORY_CARE_PROVIDER_SITE_OTHER): Payer: BC Managed Care – PPO | Admitting: Internal Medicine

## 2013-10-03 ENCOUNTER — Telehealth: Payer: Self-pay | Admitting: Internal Medicine

## 2013-10-03 ENCOUNTER — Encounter: Payer: Self-pay | Admitting: Internal Medicine

## 2013-10-03 VITALS — BP 134/87 | HR 97 | Ht 69.0 in | Wt 201.0 lb

## 2013-10-03 DIAGNOSIS — I255 Ischemic cardiomyopathy: Secondary | ICD-10-CM

## 2013-10-03 DIAGNOSIS — I2589 Other forms of chronic ischemic heart disease: Secondary | ICD-10-CM

## 2013-10-03 DIAGNOSIS — Z95810 Presence of automatic (implantable) cardiac defibrillator: Secondary | ICD-10-CM

## 2013-10-03 DIAGNOSIS — I469 Cardiac arrest, cause unspecified: Secondary | ICD-10-CM

## 2013-10-03 DIAGNOSIS — C3410 Malignant neoplasm of upper lobe, unspecified bronchus or lung: Secondary | ICD-10-CM

## 2013-10-03 LAB — ICD DEVICE OBSERVATION
BRDY-0002RV: 40 {beats}/min
CHARGE TIME: 7.1 s
DEV-0020ICD: NEGATIVE
DEVICE MODEL ICD: 7040910
FVT: 0
HV IMPEDENCE: 80 Ohm
PACEART VT: 0
RV LEAD AMPLITUDE: 11.8 mv
RV LEAD IMPEDENCE ICD: 540 Ohm
RV LEAD THRESHOLD: 1 V
TZAT-0001SLOWVT: 1
TZAT-0004SLOWVT: 8
TZAT-0012SLOWVT: 200 ms
TZAT-0013SLOWVT: 2
TZAT-0018SLOWVT: NEGATIVE
TZAT-0019SLOWVT: 7.5 V
TZAT-0020SLOWVT: 1 ms
TZON-0003SLOWVT: 300 ms
TZON-0004SLOWVT: 30
TZON-0005SLOWVT: 6
TZON-0010SLOWVT: 40 ms
TZST-0001SLOWVT: 2
TZST-0001SLOWVT: 3
TZST-0001SLOWVT: 4
TZST-0001SLOWVT: 5
TZST-0003SLOWVT: 600 V
TZST-0003SLOWVT: 800 V
TZST-0003SLOWVT: 875 V
TZST-0003SLOWVT: 875 V
VENTRICULAR PACING ICD: 0 pct
VF: 0

## 2013-10-03 NOTE — Telephone Encounter (Signed)
Patient had appointment today with Dr. Graciela Husbands to discuss this matter.

## 2013-10-03 NOTE — Patient Instructions (Addendum)
Your physician is recommending you have a pocket revision.  Your physician recommends that you have lab work today: BMET/CBCD/INR

## 2013-10-03 NOTE — Telephone Encounter (Signed)
Message left with answering service for Dr.Klein's nurse .When can pacemaker be moved as Dr.Wentworth wants to start radiation planing ASAP.May call val at [phone].
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
## 2013-10-03 NOTE — Telephone Encounter (Signed)
New problem     How soon can he remove his pacemaker location . Due to radiation treatment.

## 2013-10-03 NOTE — Telephone Encounter (Signed)
Wife aware of lab results & results released to my chart.

## 2013-10-03 NOTE — Telephone Encounter (Signed)
Spoke with Mrs.Rask today and informed pet scan ok.He has appointment with Dr.Klein today at 4:00 pm.Told her we are ready to perform ct simulation to get patient started on radiation asap.Patient will inform dr.Klein and knows to have them contact me if any questions, otherwise Mrs.Juarbe will call me.

## 2013-10-04 ENCOUNTER — Encounter: Payer: Self-pay | Admitting: Internal Medicine

## 2013-10-04 LAB — CBC WITH DIFFERENTIAL/PLATELET
Basophils Absolute: 0.1 10*3/uL (ref 0.0–0.1)
Basophils Relative: 0.5 % (ref 0.0–3.0)
Eosinophils Absolute: 0.7 10*3/uL (ref 0.0–0.7)
Eosinophils Relative: 4.8 % (ref 0.0–5.0)
HCT: 54.7 % — ABNORMAL HIGH (ref 39.0–52.0)
Hemoglobin: 17 g/dL (ref 13.0–17.0)
Lymphocytes Relative: 32.3 % (ref 12.0–46.0)
Lymphs Abs: 4.9 10*3/uL — ABNORMAL HIGH (ref 0.7–4.0)
MCHC: 31 g/dL (ref 30.0–36.0)
MCV: 84.9 fl (ref 78.0–100.0)
Monocytes Absolute: 1.5 10*3/uL — ABNORMAL HIGH (ref 0.1–1.0)
Monocytes Relative: 10.1 % (ref 3.0–12.0)
Neutro Abs: 7.8 10*3/uL — ABNORMAL HIGH (ref 1.4–7.7)
Neutrophils Relative %: 52.3 % (ref 43.0–77.0)
Platelets: 168 10*3/uL (ref 150.0–400.0)
RBC: 6.44 Mil/uL — ABNORMAL HIGH (ref 4.22–5.81)
RDW: 21.8 % — ABNORMAL HIGH (ref 11.5–14.6)
WBC: 15 10*3/uL — ABNORMAL HIGH (ref 4.5–10.5)

## 2013-10-04 LAB — BASIC METABOLIC PANEL
BUN: 13 mg/dL (ref 6–23)
CO2: 29 mEq/L (ref 19–32)
Calcium: 9.2 mg/dL (ref 8.4–10.5)
Chloride: 105 mEq/L (ref 96–112)
Creatinine, Ser: 1 mg/dL (ref 0.4–1.5)
GFR: 84.41 mL/min (ref >60.00)
Glucose, Bld: 89 mg/dL (ref 70–99)
Potassium: 4.7 mEq/L (ref 3.5–5.1)
Sodium: 141 mEq/L (ref 135–145)

## 2013-10-04 LAB — PROTIME-INR
INR: 1.7 ratio — ABNORMAL HIGH (ref 0.8–1.0)
Prothrombin Time: 18 s — ABNORMAL HIGH (ref 10.2–12.4)

## 2013-10-05 DIAGNOSIS — Z95810 Presence of automatic (implantable) cardiac defibrillator: Secondary | ICD-10-CM | POA: Insufficient documentation

## 2013-10-05 NOTE — Assessment & Plan Note (Signed)
As above.

## 2013-10-05 NOTE — Assessment & Plan Note (Addendum)
Pt has mass in left upper lobe in close proximity of ICD and has need of XRT  We will plan to move the device from L>>R and tunnel ICD lead   We have reviewed risks related to bleeding and infection  radiographically there appears to be plenty of lead to tunnel from L>>R so extenders would not be necessary

## 2013-10-05 NOTE — Assessment & Plan Note (Signed)
No intercurrent Ventricular tachycardia  

## 2013-10-06 ENCOUNTER — Encounter (HOSPITAL_COMMUNITY): Payer: Self-pay | Admitting: Pharmacist

## 2013-10-10 MED ORDER — CEFAZOLIN SODIUM-DEXTROSE 2-3 GM-% IV SOLR
2.0000 g | INTRAVENOUS | Status: DC
  Filled 2013-10-10: qty 50

## 2013-10-10 MED ORDER — SODIUM CHLORIDE 0.9 % IR SOLN
80.0000 mg | Status: DC
  Filled 2013-10-10: qty 2

## 2013-10-11 ENCOUNTER — Encounter (HOSPITAL_COMMUNITY)
Admission: RE | Disposition: A | Discharge status: Home / Self Care | Payer: Self-pay | Source: Ambulatory Visit | Attending: Internal Medicine

## 2013-10-11 ENCOUNTER — Encounter (HOSPITAL_COMMUNITY): Payer: Self-pay | Admitting: General Practice

## 2013-10-11 ENCOUNTER — Ambulatory Visit (HOSPITAL_COMMUNITY)
Admission: RE | Admit: 2013-10-11 | Discharge: 2013-10-11 | Disposition: A | Discharge status: Home / Self Care | Payer: BC Managed Care – PPO | Source: Ambulatory Visit | Attending: Internal Medicine | Admitting: Internal Medicine

## 2013-10-11 DIAGNOSIS — Z86718 Personal history of other venous thrombosis and embolism: Secondary | ICD-10-CM | POA: Insufficient documentation

## 2013-10-11 DIAGNOSIS — I5022 Chronic systolic (congestive) heart failure: Secondary | ICD-10-CM | POA: Insufficient documentation

## 2013-10-11 DIAGNOSIS — I469 Cardiac arrest, cause unspecified: Secondary | ICD-10-CM

## 2013-10-11 DIAGNOSIS — I2589 Other forms of chronic ischemic heart disease: Secondary | ICD-10-CM | POA: Insufficient documentation

## 2013-10-11 DIAGNOSIS — I10 Essential (primary) hypertension: Secondary | ICD-10-CM | POA: Insufficient documentation

## 2013-10-11 DIAGNOSIS — Z8673 Personal history of transient ischemic attack (TIA), and cerebral infarction without residual deficits: Secondary | ICD-10-CM | POA: Insufficient documentation

## 2013-10-11 DIAGNOSIS — I252 Old myocardial infarction: Secondary | ICD-10-CM | POA: Insufficient documentation

## 2013-10-11 DIAGNOSIS — I509 Heart failure, unspecified: Secondary | ICD-10-CM | POA: Insufficient documentation

## 2013-10-11 DIAGNOSIS — I472 Ventricular tachycardia: Secondary | ICD-10-CM

## 2013-10-11 DIAGNOSIS — Z8674 Personal history of sudden cardiac arrest: Secondary | ICD-10-CM | POA: Insufficient documentation

## 2013-10-11 DIAGNOSIS — J438 Other emphysema: Secondary | ICD-10-CM | POA: Insufficient documentation

## 2013-10-11 DIAGNOSIS — Z86711 Personal history of pulmonary embolism: Secondary | ICD-10-CM | POA: Insufficient documentation

## 2013-10-11 DIAGNOSIS — Z95810 Presence of automatic (implantable) cardiac defibrillator: Secondary | ICD-10-CM | POA: Insufficient documentation

## 2013-10-11 DIAGNOSIS — I255 Ischemic cardiomyopathy: Secondary | ICD-10-CM

## 2013-10-11 DIAGNOSIS — Z7901 Long term (current) use of anticoagulants: Secondary | ICD-10-CM | POA: Insufficient documentation

## 2013-10-11 DIAGNOSIS — R911 Solitary pulmonary nodule: Secondary | ICD-10-CM | POA: Insufficient documentation

## 2013-10-11 HISTORY — PX: IMPLANTABLE CARDIOVERTER DEFIBRILLATOR REVISION: SHX5861

## 2013-10-11 HISTORY — DX: Anemia, unspecified: D64.9

## 2013-10-11 HISTORY — PX: IMPLANTABLE CARDIOVERTER DEFIBRILLATOR REVISION: SHX5470

## 2013-10-11 HISTORY — DX: Personal history of other medical treatment: Z92.89

## 2013-10-11 HISTORY — DX: Pneumonia, unspecified organism: J18.9

## 2013-10-11 HISTORY — DX: Malignant neoplasm of upper lobe, unspecified bronchus or lung: C34.10

## 2013-10-11 HISTORY — DX: Old myocardial infarction: I25.2

## 2013-10-11 LAB — SURGICAL PCR SCREEN
MRSA, PCR: NEGATIVE
Staphylococcus aureus: NEGATIVE

## 2013-10-11 LAB — PROTIME-INR
INR: 1.68 — ABNORMAL HIGH (ref 0.00–1.49)
Prothrombin Time: 19.3 seconds — ABNORMAL HIGH (ref 11.6–15.2)

## 2013-10-11 SURGERY — IMPLANTABLE CARDIOVERTER DEFIBRILLATOR REVISION
Anesthesia: LOCAL

## 2013-10-11 MED ORDER — FENTANYL CITRATE 0.05 MG/ML IJ SOLN
INTRAMUSCULAR | Status: AC
  Filled 2013-10-11: qty 2

## 2013-10-11 MED ORDER — MIDAZOLAM HCL 5 MG/5ML IJ SOLN
INTRAMUSCULAR | Status: AC
  Filled 2013-10-11: qty 5

## 2013-10-11 MED ORDER — MUPIROCIN 2 % EX OINT
TOPICAL_OINTMENT | Freq: Two times a day (BID) | CUTANEOUS | Status: DC
  Administered 2013-10-11: 1 via NASAL
  Filled 2013-10-11: qty 22

## 2013-10-11 MED ORDER — SODIUM CHLORIDE 0.9 % IV SOLN
INTRAVENOUS | Status: DC
  Administered 2013-10-11: 50 mL/h via INTRAVENOUS

## 2013-10-11 MED ORDER — SODIUM CHLORIDE 0.9 % IV SOLN: INTRAVENOUS | Status: AC

## 2013-10-11 MED ORDER — CHLORHEXIDINE GLUCONATE 4 % EX LIQD
60.0000 mL | Freq: Once | CUTANEOUS | Status: DC
  Filled 2013-10-11: qty 60

## 2013-10-11 MED ORDER — ONDANSETRON HCL 4 MG/2ML IJ SOLN: 4.0000 mg | Freq: Four times a day (QID) | INTRAMUSCULAR | Status: DC | PRN

## 2013-10-11 MED ORDER — ACETAMINOPHEN 325 MG PO TABS: 325.0000 mg | ORAL_TABLET | ORAL | Status: DC | PRN

## 2013-10-11 MED ORDER — LIDOCAINE-EPINEPHRINE 1 %-1:100000 IJ SOLN
INTRAMUSCULAR | Status: AC
  Filled 2013-10-11: qty 1

## 2013-10-11 MED ORDER — LIDOCAINE HCL (PF) 1 % IJ SOLN
INTRAMUSCULAR | Status: AC
  Filled 2013-10-11: qty 60

## 2013-10-11 NOTE — Interval H&P Note (Signed)
ICD Criteria  Current LVEF (within 6 months):55%  NYHA Functional Classification: Class II  Heart Failure History:  Yes, Duration of heart failure since onset is > 9 months  Non-Ischemic Dilated Cardiomyopathy History:  No.  Atrial Fibrillation/Atrial Flutter:  No.  Ventricular Tachycardia History:  Yes, Hemodynamic instability present, VT Type is unknown.  Cardiac Arrest History:  Yes, This was a Ventricular Tachycardia/Ventricular Fibrillation Arrest. This was NOT a bradycardia arrest.  History of Syndromes with Risk of Sudden Death:  No.  Previous ICD:  Yes, ICD Type:  Single, Reason for ICD:  Secondary, reason for secondary prevention:  Ventricular Fibrillation.  Electrophysiology Study: yes non inducible  Anticoagulation Therapy:  Patient is on anticoagulation therapy, anticoagulation was NOT held prior to procedure.   Beta Blocker Therapy:  No     Ace Inhibitor/ARB Therapy:  No, Reason not on Ace Inhibitor/ARB therapy:  no lv dysfuncitonHistory and Physical Interval Note:  10/11/2013 3:14 PM  Travis Cruz  has presented today for surgery, with the diagnosis of ICM  The various methods of treatment have been discussed with the patient and family. After consideration of risks, benefits and other options for treatment, the patient has consented to  Procedure(s): IMPLANTABLE CARDIOVERTER DEFIBRILLATOR REVISION (N/A) as a surgical intervention .  The patient's history has been reviewed, patient examined, no change in status, stable for surgery.  I have reviewed the patient's chart and labs.  Questions were answered to the patient's satisfaction.     [name]

## 2013-10-11 NOTE — Progress Notes (Signed)
RT note: Placed patient on cpap per MD request for procedure. Placed on auto titration mode. Used a nasal mask per device use. Patient resting comfortably and stated that pressure were appropriate. Vital signs stable, HR 87, RR 18, sats 96% with nasal canula in place.
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
## 2013-10-11 NOTE — CV Procedure (Signed)
Travis Cruz [id number]  [id number]  Preop Dx: prev ICD for VF arrest with need for therapeutic radiaation Postop Dx same/   Procedure: explantation of device creation of new pocket lead tunneling and insetion of new device  Device assessment and DFT Aborted   Cx: None   Dictation number [id number]  [name], MD 10/11/2013 5:26 PM

## 2013-10-11 NOTE — Progress Notes (Signed)
Pt alert, ate supper without difficulty, denies complaints, resp easy.  Has been wearing O2 at 3L because O2 sat drops in mid 80's when asleep.  When awake and sitting up, o2 sat 95% on ra.  Pt wears cpap w/ O2 at home when asleep.  Post care instructions reviewed w/ pt and wife.  Bil chest dressings D&I.  No questions voiced.  D/C per w/c with NT.

## 2013-10-12 NOTE — Op Note (Signed)
NAMEMCKENNON, ZWART NO.:  [id number]  MEDICAL RECORD NO.:  [id number]  LOCATION:  6C07C                        FACILITY:  [facility]  PHYSICIAN:  Duke Salvia, MD, FACCDATE OF BIRTH:  1946/10/29  DATE OF PROCEDURE:  10/11/2013 DATE OF DISCHARGE:  10/11/2013                              OPERATIVE REPORT   PREOPERATIVE DIAGNOSIS:  Previously implanted implantable cardioverter- defibrillator and needed therapeutic radiation.  POSTOPERATIVE DIAGNOSIS:  Previously implanted implantable cardioverter- defibrillator and needed therapeutic radiation.  PROCEDURES:  Explantation of device, pocket revision, translocation of the lead, creation of a pocket insertion of the device on the contralateral side.  DESCRIPTION OF PROCEDURE:  Following obtaining informed consent, the patient was brought to the Electrophysiology Laboratory and placed on the fluoroscopic table in supine position.  After routine prep and drape of the left upper chest, lidocaine was infiltrated along the line of the previous incision, carried down to layer of the device pocket using sharp dissection with electrocautery.  The pocket was opened.  The device was explanted.  The lead collar was freed up and angulated about 45 degrees caudally.  We then dissected the tissue briefly towards the midline of the sternum.  At this point, an axillary incision was made on the right side following local anesthesia.  A pocket was formed and a 20-French chest tube was used to tunnel from right to left, which allowed for movement of the leads from left to right.  A second anchoring suture was applied on the left side to prevent acute angulation.  The pocket was copiously irrigated with antibiotic containing saline solution.  On the left, Surgicel was placed and the wound was closed in two layers, the first layer now and the second layer as well as final part of the procedure.  We then attached the leads to the [facility].  Jude device where parameters were stable.  The pocket was copiously irrigated with antibiotic-containing saline solution.  Surgicel was placed on the floor into the cephalad aspect of the pocket, and the leads and the pulse generator were placed in the pocket, secured to the prepectoral fascia.  There was not a great deal of extra lead available.  The wound was closed in two layers in normal fashion.  The wound was washed, dried, and a Dermabond dressing was applied.  The left-sided pocket was then closed again with a final layer of Dermabond was applied.  I then scrubbed out to undertake defibrillation threshold testing. However, the patient required a large amount of sedation, which caused him to hypoventilate a little bit.  We then ended up having to bag in and by the time, respiratory status was stable, which was within a minute or 2.  The patient had awakened from his anesthetic and was elected not to pursue DFT testing.     Duke Salvia, MD, [facility]     SCK/MEDQ  D:  10/11/2013  T:  10/12/2013  Job:  [id number]

## 2013-10-17 ENCOUNTER — Telehealth: Payer: Self-pay | Admitting: Internal Medicine

## 2013-10-17 ENCOUNTER — Ambulatory Visit (INDEPENDENT_AMBULATORY_CARE_PROVIDER_SITE_OTHER): Payer: BC Managed Care – PPO | Admitting: *Deleted

## 2013-10-17 DIAGNOSIS — I255 Ischemic cardiomyopathy: Secondary | ICD-10-CM

## 2013-10-17 DIAGNOSIS — I2589 Other forms of chronic ischemic heart disease: Secondary | ICD-10-CM

## 2013-10-17 NOTE — Telephone Encounter (Signed)
Patient's wife returning call - explained she needed to bring him in to check swollen site. She is agreeable to plan and will be here as soon as they can. Gunnar Fusi in device clinic notified.

## 2013-10-17 NOTE — Progress Notes (Signed)
Wound check only for concerns of swelling at the site where his device was moved from on the left.  There is minimal swelling noted.  Per Dr. Ladona Ridgel the patient is to continue on his coumadin and return 10/21/13 @ 9am for his scheduled wound check.

## 2013-10-17 NOTE — Telephone Encounter (Signed)
LMTCB. I spoke with Delsa Grana in device clinic - she wants patient to come in to be checked this morning, she is placing him on their schedule.

## 2013-10-17 NOTE — Telephone Encounter (Signed)
New Problem:  Pt's wife states husband's defib wound site on the left has a large lump on it. Pt's wife would like to be advised.

## 2013-10-19 ENCOUNTER — Ambulatory Visit (INDEPENDENT_AMBULATORY_CARE_PROVIDER_SITE_OTHER): Payer: BC Managed Care – PPO | Admitting: Pharmacist

## 2013-10-19 DIAGNOSIS — I82409 Acute embolism and thrombosis of unspecified deep veins of unspecified lower extremity: Secondary | ICD-10-CM

## 2013-10-19 LAB — POCT INR: INR: 1.5

## 2013-10-19 NOTE — Patient Instructions (Addendum)
Anticoagulation Dose Instructions as of 10/19/2013     Travis Cruz Tue Wed Thu Fri Sat   New Dose 4 mg 2 mg 4 mg 2 mg 4 mg 2 mg 4 mg    Description       Take 2 and 1/2 tablets [=5mg ] today and tomorrow, then change dose to  Continue 1 tablet on Mondays, Wednesdays and Fridays and 2 tablets all other days.         INR was 1.5 today

## 2013-10-20 ENCOUNTER — Ambulatory Visit: Payer: BC Managed Care – PPO

## 2013-10-21 ENCOUNTER — Ambulatory Visit (INDEPENDENT_AMBULATORY_CARE_PROVIDER_SITE_OTHER): Payer: BC Managed Care – PPO | Admitting: *Deleted

## 2013-10-21 DIAGNOSIS — I469 Cardiac arrest, cause unspecified: Secondary | ICD-10-CM

## 2013-10-21 LAB — ICD DEVICE OBSERVATION
BRDY-0002RV: 40 {beats}/min
DEV-0020ICD: NEGATIVE
DEVICE MODEL ICD: 7040910
FVT: 0
HV IMPEDENCE: 63 Ohm
PACEART VT: 0
RV LEAD AMPLITUDE: 11.8 mv
RV LEAD IMPEDENCE ICD: 512.5 Ohm
RV LEAD THRESHOLD: 1 V
TOT-0007: 1
TOT-0008: 0
TOT-0009: 3
TOT-0010: 0
TZAT-0001SLOWVT: 1
TZAT-0004SLOWVT: 8
TZAT-0012SLOWVT: 200 ms
TZAT-0013SLOWVT: 2
TZAT-0018SLOWVT: NEGATIVE
TZAT-0019SLOWVT: 7.5 V
TZAT-0020SLOWVT: 1 ms
TZON-0003SLOWVT: 300 ms
TZON-0004SLOWVT: 30
TZON-0005SLOWVT: 6
TZON-0010SLOWVT: 40 ms
TZST-0001SLOWVT: 2
TZST-0001SLOWVT: 3
TZST-0001SLOWVT: 4
TZST-0001SLOWVT: 5
TZST-0003SLOWVT: 800 V
TZST-0003SLOWVT: 875 V
TZST-0003SLOWVT: 875 V
TZST-0003SLOWVT: 875 V
VENTRICULAR PACING ICD: 0 pct
VF: 0

## 2013-10-21 NOTE — Progress Notes (Signed)
Wound check appointment.  Wound without redness.  The site where his device was moved from on the left continues to have some swelling but it appears to be less than when he was checked 10/17/13.    Incision edges approximated, wound well healed. Normal device function. Thresholds, sensing, and impedances consistent with implant measurements. Device programmed at 3.5V for extra safety margin until 3 month visit. Histogram distribution appropriate for patient and level of activity. No mode switches or ventricular arrhythmias noted. Patient educated about wound care, arm mobility, lifting restrictions, shock plan. ROV in 3 months with implanting physician.  ROV after he finishes his radiation treatment.  His wife will call after his appointment on 10/26/13 and let us know the radiation schedule.

## 2013-10-21 NOTE — Progress Notes (Signed)
New pacemaker form faxed to [facility] devices department to be completed as patient had exchange from left chest wall to right to prepare for radiation.Spoke with Leonette Most and he has received form and knows it is needed asap as ct simulation scheduled 10/26/13.

## 2013-10-26 ENCOUNTER — Ambulatory Visit (INDEPENDENT_AMBULATORY_CARE_PROVIDER_SITE_OTHER): Payer: BC Managed Care – PPO | Admitting: Pharmacist

## 2013-10-26 ENCOUNTER — Ambulatory Visit
Admission: RE | Admit: 2013-10-26 | Discharge: 2013-10-26 | Disposition: A | Discharge status: Home / Self Care | Payer: BC Managed Care – PPO | Source: Ambulatory Visit | Attending: Radiation Oncology | Admitting: Radiation Oncology

## 2013-10-26 DIAGNOSIS — C3490 Malignant neoplasm of unspecified part of unspecified bronchus or lung: Secondary | ICD-10-CM | POA: Insufficient documentation

## 2013-10-26 DIAGNOSIS — Z510 Encounter for antineoplastic radiation therapy: Secondary | ICD-10-CM | POA: Insufficient documentation

## 2013-10-26 DIAGNOSIS — I82409 Acute embolism and thrombosis of unspecified deep veins of unspecified lower extremity: Secondary | ICD-10-CM

## 2013-10-26 LAB — POCT INR: INR: 4.1

## 2013-10-26 NOTE — Patient Instructions (Signed)
Anticoagulation Dose Instructions as of 10/26/2013     Glynis Smiles Tue Wed Thu Fri Sat   New Dose 2 mg 4 mg 2 mg 4 mg 2 mg 4 mg 2 mg    Description       Hold warfarin 1 day, then take 1/2 tablet tomorrow. Then go back to 4mg  ( = 2 tablets) on MWF and 2mg  (1 tablet) all others.      INR was 4.1 today

## 2013-10-26 NOTE — Progress Notes (Signed)
[facility] Radiation Oncology Simulation and Treatment Planning Note   Name: Rhen Kawecki MRN: [id number]  Date: 10/26/2013  DOB: June 11, 1946  Status: outpatient  DIAGNOSIS: There were no encounter diagnoses.  SIDE: left  CONSENT VERIFIED: yes  SET UP AND IMMOBILIZATION: Patient is setup supine in a vac loc with a custom moldable pillow for head and neck immobilization   NARRATIVE/Motion Management: The patient was brought to the CT Simulation planning suite.  Identity was confirmed.  All relevant records and images related to the planned course of therapy were reviewed.  Then, the patient was positioned in a stable reproducible clinical set-up for radiation therapy.  CT images were obtained.  Skin markings were placed.  A four dimensional simulation was then performed to track tumor movement throughout the patients' breathing cycle. The CT images were loaded into the planning software where the target and avoidance structures were contoured.  The GTV was outlined on the free breathing, 4D and MIP image sets.  The radiation prescription was entered and confirmed.   TREATMENT PLANNING NOTE:  Treatment planning then occurred. I have requested 3D simulation with [facility] of the spinal cord, total lungs and gross tumor volume. I have also requested mlcs and an isodose plan.   Special treatment procedure will be performed as [name] will be receiving high dose per fraction.

## 2013-10-31 ENCOUNTER — Encounter: Payer: Self-pay | Admitting: Radiation Oncology

## 2013-11-01 ENCOUNTER — Other Ambulatory Visit: Payer: Self-pay | Admitting: Family Medicine

## 2013-11-02 ENCOUNTER — Ambulatory Visit (INDEPENDENT_AMBULATORY_CARE_PROVIDER_SITE_OTHER): Payer: BC Managed Care – PPO | Admitting: Pharmacist

## 2013-11-02 DIAGNOSIS — I82409 Acute embolism and thrombosis of unspecified deep veins of unspecified lower extremity: Secondary | ICD-10-CM

## 2013-11-02 LAB — POCT INR: INR: 2.2

## 2013-11-02 MED ORDER — WARFARIN SODIUM 2 MG PO TABS: 2.0000 mg | ORAL_TABLET | Freq: Every day | ORAL | Status: DC

## 2013-11-02 NOTE — Patient Instructions (Signed)
Anticoagulation Dose Instructions as of 11/02/2013     Travis Cruz Tue Wed Thu Fri Sat   New Dose 2 mg 4 mg 2 mg 4 mg 2 mg 4 mg 2 mg    Description       Continue 4mg  ( = 2 tablets) on MWF and 2mg  (1 tablet) all others.      INR was 2.2 today

## 2013-11-03 ENCOUNTER — Other Ambulatory Visit: Payer: Self-pay | Admitting: Family Medicine

## 2013-11-03 ENCOUNTER — Encounter: Payer: Self-pay | Admitting: Internal Medicine

## 2013-11-04 ENCOUNTER — Encounter: Payer: Self-pay | Admitting: Internal Medicine

## 2013-11-07 ENCOUNTER — Ambulatory Visit
Admission: RE | Admit: 2013-11-07 | Discharge: 2013-11-07 | Disposition: A | Discharge status: Home / Self Care | Payer: BC Managed Care – PPO | Source: Ambulatory Visit | Attending: Radiation Oncology | Admitting: Radiation Oncology

## 2013-11-07 ENCOUNTER — Encounter: Payer: BC Managed Care – PPO | Admitting: *Deleted

## 2013-11-07 ENCOUNTER — Encounter: Payer: Self-pay | Admitting: Internal Medicine

## 2013-11-07 NOTE — Progress Notes (Signed)
  Radiation Oncology         [phone] ________________________________  Name: Travis Cruz MRN: [id number]  Date: 11/07/2013  DOB: 08-Feb-1946  Stereotactic Body Radiotherapy Treatment Procedure Note  NARRATIVE:  Travis Cruz was brought to the stereotactic radiation treatment machine and placed supine on the CT couch. The patient was set up for stereotactic body radiotherapy on the body fix pillow.  3D TREATMENT PLANNING AND DOSIMETRY:  The patient's radiation plan was reviewed and approved prior to starting treatment.  It showed 3-dimensional radiation distributions overlaid onto the planning CT.  [facility] for the target structures as well as the organs at risk were reviewed. The documentation of this is filed in the radiation oncology EMR.  SIMULATION VERIFICATION:  The patient underwent CT imaging on the treatment unit.  These were carefully aligned to document that the ablative radiation dose would cover the target volume and maximally spare the nearby organs at risk according to the planned distribution.  SPECIAL TREATMENT PROCEDURE: Travis Cruz received high dose ablative stereotactic body radiotherapy to the planned target volume without unforeseen complications. Treatment was delivered uneventfully. The high doses associated with stereotactic body radiotherapy and the significant potential risks require careful treatment set up and patient monitoring constituting a special treatment procedure   STEREOTACTIC TREATMENT MANAGEMENT:  Following delivery, the patient was evaluated clinically. The patient tolerated treatment without significant acute effects, and was discharged to home in stable condition.    PLAN: Continue treatment as planned.  ________________________________  Billie Lade, PhD, MD

## 2013-11-09 ENCOUNTER — Ambulatory Visit
Admission: RE | Admit: 2013-11-09 | Discharge: 2013-11-09 | Disposition: A | Discharge status: Home / Self Care | Payer: BC Managed Care – PPO | Source: Ambulatory Visit | Attending: Radiation Oncology | Admitting: Radiation Oncology

## 2013-11-09 ENCOUNTER — Encounter: Payer: BC Managed Care – PPO | Admitting: *Deleted

## 2013-11-09 NOTE — Progress Notes (Signed)
  Radiation Oncology         [phone]) [phone] ________________________________  Name: Travis Cruz MRN: [id number]  Date: 11/09/2013  DOB: Jun 03, 1946  Stereotactic Body Radiotherapy Treatment Procedure Note  NARRATIVE:  Travis Cruz was brought to the stereotactic radiation treatment machine and placed supine on the CT couch. The patient was set up for stereotactic body radiotherapy on the body fix pillow.  3D TREATMENT PLANNING AND DOSIMETRY:  The patient's radiation plan was reviewed and approved prior to starting treatment.  It showed 3-dimensional radiation distributions overlaid onto the planning CT.  The [facility] for the target structures as well as the organs at risk were reviewed. The documentation of this is filed in the radiation oncology EMR.  SIMULATION VERIFICATION:  The patient underwent CT imaging on the treatment unit.  These were carefully aligned to document that the ablative radiation dose would cover the target volume and maximally spare the nearby organs at risk according to the planned distribution.  SPECIAL TREATMENT PROCEDURE: Travis Cruz received high dose ablative stereotactic body radiotherapy to the planned target volume without unforeseen complications. Treatment was delivered uneventfully. The high doses associated with stereotactic body radiotherapy and the significant potential risks require careful treatment set up and patient monitoring constituting a special treatment procedure   STEREOTACTIC TREATMENT MANAGEMENT:  Following delivery, the patient was evaluated clinically. The patient tolerated treatment without significant acute effects, and was discharged to home in stable condition.    PLAN: Continue treatment as planned.  ________________________________  Billie Lade, PhD, MD

## 2013-11-11 ENCOUNTER — Ambulatory Visit
Admission: RE | Admit: 2013-11-11 | Discharge: 2013-11-11 | Disposition: A | Discharge status: Home / Self Care | Payer: BC Managed Care – PPO | Source: Ambulatory Visit | Attending: Radiation Oncology | Admitting: Radiation Oncology

## 2013-11-14 ENCOUNTER — Encounter: Payer: Self-pay | Admitting: Internal Medicine

## 2013-11-14 ENCOUNTER — Ambulatory Visit (INDEPENDENT_AMBULATORY_CARE_PROVIDER_SITE_OTHER): Payer: BC Managed Care – PPO | Admitting: *Deleted

## 2013-11-14 ENCOUNTER — Encounter: Payer: Self-pay | Admitting: Radiation Oncology

## 2013-11-14 ENCOUNTER — Ambulatory Visit
Admission: RE | Admit: 2013-11-14 | Discharge: 2013-11-14 | Disposition: A | Discharge status: Home / Self Care | Payer: BC Managed Care – PPO | Source: Ambulatory Visit | Attending: Radiation Oncology | Admitting: Radiation Oncology

## 2013-11-14 DIAGNOSIS — Z95810 Presence of automatic (implantable) cardiac defibrillator: Secondary | ICD-10-CM

## 2013-11-14 NOTE — Progress Notes (Signed)
  Radiation Oncology         [phone] ________________________________  Name: Travis Cruz MRN: [id number]  Date: 11/14/2013  DOB: 08-Oct-1946  Stereotactic Body Radiotherapy Treatment Procedure Note  NARRATIVE:  Travis Cruz was brought to the stereotactic radiation treatment machine and placed supine on the CT couch. The patient was set up for stereotactic body radiotherapy on the body fix pillow.  3D TREATMENT PLANNING AND DOSIMETRY:  The patient's radiation plan was reviewed and approved prior to starting treatment.  It showed 3-dimensional radiation distributions overlaid onto the planning CT.  The [facility] for the target structures as well as the organs at risk were reviewed. The documentation of this is filed in the radiation oncology EMR.  SIMULATION VERIFICATION:  The patient underwent CT imaging on the treatment unit.  These were carefully aligned to document that the ablative radiation dose would cover the left lung target volume and maximally spare the nearby organs at risk according to the planned distribution.  SPECIAL TREATMENT PROCEDURE: Travis Cruz received high dose ablative stereotactic body radiotherapy to the planned left lung target volume without unforeseen complications. Treatment was delivered uneventfully. He received in addition 1200 cGy for cumulative dose of 4800 cGy in 4 sessions. The high doses associated with stereotactic body radiotherapy and the significant potential risks require careful treatment set up and patient monitoring constituting a special treatment procedure   STEREOTACTIC TREATMENT MANAGEMENT:  Following delivery, the patient was evaluated clinically. The patient tolerated treatment without significant acute effects, and was discharged to home in stable condition.    PLAN: Continue treatment as planned.  ------------------------------------------------        Maryln Gottron, MD

## 2013-11-16 ENCOUNTER — Ambulatory Visit
Admission: RE | Admit: 2013-11-16 | Discharge: 2013-11-16 | Disposition: A | Discharge status: Home / Self Care | Payer: BC Managed Care – PPO | Source: Ambulatory Visit | Attending: Radiation Oncology | Admitting: Radiation Oncology

## 2013-11-16 ENCOUNTER — Encounter: Payer: BC Managed Care – PPO | Admitting: *Deleted

## 2013-11-16 DIAGNOSIS — IMO0001 Reserved for inherently not codable concepts without codable children: Secondary | ICD-10-CM

## 2013-11-16 HISTORY — DX: Reserved for inherently not codable concepts without codable children: IMO0001

## 2013-11-16 NOTE — Progress Notes (Signed)
  Radiation Oncology         [phone]) [phone] ________________________________  Name: Travis Cruz MRN: [id number]  Date: 11/16/2013  DOB: Jul 29, 1946  Stereotactic Body Radiotherapy Treatment Procedure Note  NARRATIVE:  Travis Cruz was brought to the stereotactic radiation treatment machine and placed supine on the CT couch. The patient was set up for stereotactic body radiotherapy on the body fix pillow.  3D TREATMENT PLANNING AND DOSIMETRY:  The patient's radiation plan was reviewed and approved prior to starting treatment.  It showed 3-dimensional radiation distributions overlaid onto the planning CT.  The [facility] for the target structures as well as the organs at risk were reviewed. The documentation of this is filed in the radiation oncology EMR.  SIMULATION VERIFICATION:  The patient underwent CT imaging on the treatment unit.  These were carefully aligned to document that the ablative radiation dose would cover the target volume and maximally spare the nearby organs at risk according to the planned distribution.  SPECIAL TREATMENT PROCEDURE: Travis Cruz received high dose ablative stereotactic body radiotherapy to the planned target volume without unforeseen complications. Treatment was delivered uneventfully. The high doses associated with stereotactic body radiotherapy and the significant potential risks require careful treatment set up and patient monitoring constituting a special treatment procedure   STEREOTACTIC TREATMENT MANAGEMENT:  Following delivery, the patient was evaluated clinically. The patient tolerated treatment without significant acute effects, and was discharged to home in stable condition.    PLAN: Continue treatment as planned.  _________________________   Lurline Hare, MD

## 2013-11-16 NOTE — Progress Notes (Signed)
  Radiation Oncology         [phone] ________________________________  Name: Travis Cruz MRN: [id number]  Date: 11/16/2013  DOB: 01-Jul-1946  End of Treatment Note  Diagnosis:   T1N0 NSCLC of the left upper lobe     Indication for treatment:  Curative       Radiation treatment dates:   11/07/13-11/16/13  Site/dose:   Left upper lobe / 60 Gy in 5 fractions at 12 Gray per fraction  Beams/energy:   6 MV photons delivered using dynamic conformal arcs  Narrative: The patient tolerated radiation treatment relatively well.   He had no ill effects from treatment.   Plan: The patient has completed radiation treatment. The patient will return to radiation oncology clinic for routine followup in one month. I advised them to call or return sooner if they have any questions or concerns related to their recovery or treatment.  ------------------------------------------------  Lurline Hare, MD

## 2013-11-16 NOTE — Progress Notes (Signed)
Weekly Management Note Current Dose:  60 Gy  Projected Dose: 60 Gy   Narrative:  The patient presents for routine under treatment assessment.  CBCT/MVCT images/Port film x-rays were reviewed.  The chart was checked. Doing well. No complaints. Appt with cardiology tomorrow.  Physical Findings: Weight: 205 lb 1.6 oz (93.033 kg). Unchanged  Impression:  Finishes RT today  Plan:  Follow up in 1 month. Sooner prn.

## 2013-11-16 NOTE — Progress Notes (Signed)
Patient has ccompleted 5 of 5 SBRT treatments.Denies pain, shortness of breath or cough.To follow up in one month.Knows to call if any questions or concerns.

## 2013-11-17 ENCOUNTER — Ambulatory Visit (INDEPENDENT_AMBULATORY_CARE_PROVIDER_SITE_OTHER): Payer: BC Managed Care – PPO | Admitting: *Deleted

## 2013-11-17 ENCOUNTER — Ambulatory Visit (INDEPENDENT_AMBULATORY_CARE_PROVIDER_SITE_OTHER): Payer: BC Managed Care – PPO | Admitting: Pharmacist

## 2013-11-17 DIAGNOSIS — I82409 Acute embolism and thrombosis of unspecified deep veins of unspecified lower extremity: Secondary | ICD-10-CM

## 2013-11-17 DIAGNOSIS — I469 Cardiac arrest, cause unspecified: Secondary | ICD-10-CM

## 2013-11-17 DIAGNOSIS — I255 Ischemic cardiomyopathy: Secondary | ICD-10-CM

## 2013-11-17 DIAGNOSIS — I2589 Other forms of chronic ischemic heart disease: Secondary | ICD-10-CM

## 2013-11-17 LAB — MDC_IDC_ENUM_SESS_TYPE_INCLINIC
Battery Remaining Longevity: 88.8 mo
Brady Statistic RV Percent Paced: 0 %
Date Time Interrogation Session: 20141120110211
HighPow Impedance: 77.625
HighPow Impedance: 78 Ohm
Implantable Pulse Generator Serial Number: 7040910
Lead Channel Impedance Value: 512.5 Ohm
Lead Channel Pacing Threshold Amplitude: 0.75 V
Lead Channel Pacing Threshold Pulse Width: 0.5 ms
Lead Channel Sensing Intrinsic Amplitude: 11.8 mV
Lead Channel Setting Pacing Amplitude: 2.5 V
Lead Channel Setting Pacing Pulse Width: 0.5 ms
Lead Channel Setting Sensing Sensitivity: 0.5 mV
Zone Setting Detection Interval: 250 ms
Zone Setting Detection Interval: 300 ms

## 2013-11-17 LAB — POCT INR: INR: 2

## 2013-11-17 NOTE — Patient Instructions (Signed)
Anticoagulation Dose Instructions as of 11/17/2013     Travis Cruz Tue Wed Thu Fri Sat   New Dose 2 mg 4 mg 2 mg 4 mg 2 mg 4 mg 2 mg    Description       Continue 4mg  ( = 2 tablets) on MWF and 2mg  (1 tablet) all others.      INR was 2.0 today

## 2013-11-17 NOTE — Progress Notes (Signed)
ICD check in clinic post radiation therapy. Normal device function. Thresholds and sensing consistent with previous device measurements. Impedance trends stable over time. No evidence of any ventricular arrhythmias.  Histogram distribution appropriate for patient and level of activity. No changes made this session. Device programmed at appropriate safety margins. Device programmed to optimize intrinsic conduction. Estimated longevity 7.4 years. Pt enrolled in remote follow-up. Plan to check device every 3 months remotely and in office annually. Patient education completed including shock plan. Alert tones/vibration demonstrated for patient. CorVue abnormal for 11 days in mid October.    ROV in March with Dr. Graciela Husbands.

## 2013-11-30 NOTE — Progress Notes (Signed)
  Radiation Oncology         [phone] ________________________________  Name: Travis Cruz MRN: [id number]  Date: 11/11/2013  DOB: 1946-08-28  Stereotactic Body Radiotherapy Treatment Procedure Note  NARRATIVE:  Travis Cruz was brought to the stereotactic radiation treatment machine and placed supine on the CT couch. The patient was set up for stereotactic body radiotherapy on the body fix pillow.  3D TREATMENT PLANNING AND DOSIMETRY:  The patient's radiation plan was reviewed and approved prior to starting treatment.  It showed 3-dimensional radiation distributions overlaid onto the planning CT.  The [facility] for the target structures as well as the organs at risk were reviewed. The documentation of this is filed in the radiation oncology EMR.  SIMULATION VERIFICATION:  The patient underwent CT imaging on the treatment unit.  These were carefully aligned to document that the ablative radiation dose would cover the target volume and maximally spare the nearby organs at risk according to the planned distribution.  SPECIAL TREATMENT PROCEDURE: Travis Cruz received high dose ablative stereotactic body radiotherapy to the planned target volume without unforeseen complications. Treatment was delivered uneventfully. The high doses associated with stereotactic body radiotherapy and the significant potential risks require careful treatment set up and patient monitoring constituting a special treatment procedure   STEREOTACTIC TREATMENT MANAGEMENT:  Following delivery, the patient was evaluated clinically. The patient tolerated treatment without significant acute effects, and was discharged to home in stable condition.    PLAN: Continue treatment as planned.  _________________________   Lurline Hare, MD

## 2013-12-14 ENCOUNTER — Ambulatory Visit (INDEPENDENT_AMBULATORY_CARE_PROVIDER_SITE_OTHER): Payer: BC Managed Care – PPO | Admitting: Pharmacist

## 2013-12-14 DIAGNOSIS — I82409 Acute embolism and thrombosis of unspecified deep veins of unspecified lower extremity: Secondary | ICD-10-CM

## 2013-12-14 LAB — POCT INR: INR: 2

## 2013-12-14 NOTE — Patient Instructions (Signed)
Anticoagulation Dose Instructions as of 12/14/2013     Travis Cruz Tue Wed Thu Fri Sat   New Dose 2 mg 4 mg 2 mg 4 mg 2 mg 4 mg 2 mg    Description       Continue 4mg  ( = 2 tablets) on MWF and 2mg  (1 tablet) all others.      INR was 2.0 today

## 2013-12-15 ENCOUNTER — Encounter: Payer: Self-pay | Admitting: Cardiothoracic Surgery

## 2013-12-15 ENCOUNTER — Ambulatory Visit (INDEPENDENT_AMBULATORY_CARE_PROVIDER_SITE_OTHER): Payer: BC Managed Care – PPO | Admitting: Cardiothoracic Surgery

## 2013-12-15 DIAGNOSIS — C3410 Malignant neoplasm of upper lobe, unspecified bronchus or lung: Secondary | ICD-10-CM

## 2013-12-15 DIAGNOSIS — Z923 Personal history of irradiation: Secondary | ICD-10-CM

## 2013-12-15 DIAGNOSIS — Z5189 Encounter for other specified aftercare: Secondary | ICD-10-CM
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
## 2014-01-03 ENCOUNTER — Encounter: Payer: Self-pay | Admitting: Family Medicine

## 2014-01-03 ENCOUNTER — Ambulatory Visit (INDEPENDENT_AMBULATORY_CARE_PROVIDER_SITE_OTHER): Payer: BC Managed Care – PPO | Admitting: Family Medicine

## 2014-01-03 VITALS — BP 126/85 | HR 98 | Temp 98.0°F | Ht 69.0 in | Wt 204.6 lb

## 2014-01-03 DIAGNOSIS — IMO0002 Reserved for concepts with insufficient information to code with codable children: Secondary | ICD-10-CM

## 2014-01-03 DIAGNOSIS — I639 Cerebral infarction, unspecified: Secondary | ICD-10-CM

## 2014-01-03 DIAGNOSIS — I255 Ischemic cardiomyopathy: Secondary | ICD-10-CM

## 2014-01-03 DIAGNOSIS — I2510 Atherosclerotic heart disease of native coronary artery without angina pectoris: Secondary | ICD-10-CM

## 2014-01-03 DIAGNOSIS — I214 Non-ST elevation (NSTEMI) myocardial infarction: Secondary | ICD-10-CM

## 2014-01-03 DIAGNOSIS — J439 Emphysema, unspecified: Secondary | ICD-10-CM

## 2014-01-03 DIAGNOSIS — Z8673 Personal history of transient ischemic attack (TIA), and cerebral infarction without residual deficits: Secondary | ICD-10-CM

## 2014-01-03 DIAGNOSIS — I6350 Cerebral infarction due to unspecified occlusion or stenosis of unspecified cerebral artery: Secondary | ICD-10-CM

## 2014-01-03 DIAGNOSIS — I10 Essential (primary) hypertension: Secondary | ICD-10-CM

## 2014-01-03 DIAGNOSIS — I2589 Other forms of chronic ischemic heart disease: Secondary | ICD-10-CM

## 2014-01-03 DIAGNOSIS — D62 Acute posthemorrhagic anemia: Secondary | ICD-10-CM

## 2014-01-03 DIAGNOSIS — E785 Hyperlipidemia, unspecified: Secondary | ICD-10-CM

## 2014-01-03 DIAGNOSIS — G4733 Obstructive sleep apnea (adult) (pediatric): Secondary | ICD-10-CM

## 2014-01-03 DIAGNOSIS — Z95810 Presence of automatic (implantable) cardiac defibrillator: Secondary | ICD-10-CM

## 2014-01-03 DIAGNOSIS — R569 Unspecified convulsions: Secondary | ICD-10-CM

## 2014-01-03 DIAGNOSIS — I469 Cardiac arrest, cause unspecified: Secondary | ICD-10-CM

## 2014-01-03 DIAGNOSIS — C3410 Malignant neoplasm of upper lobe, unspecified bronchus or lung: Secondary | ICD-10-CM

## 2014-01-03 DIAGNOSIS — I2699 Other pulmonary embolism without acute cor pulmonale: Secondary | ICD-10-CM

## 2014-01-03 DIAGNOSIS — I82409 Acute embolism and thrombosis of unspecified deep veins of unspecified lower extremity: Secondary | ICD-10-CM

## 2014-01-03 DIAGNOSIS — J438 Other emphysema: Secondary | ICD-10-CM

## 2014-01-03 DIAGNOSIS — J449 Chronic obstructive pulmonary disease, unspecified: Secondary | ICD-10-CM

## 2014-01-03 DIAGNOSIS — Z8679 Personal history of other diseases of the circulatory system: Secondary | ICD-10-CM

## 2014-01-03 LAB — POCT CBC
Granulocyte percent: 75.6 %G (ref 37–80)
HCT, POC: 55.4 % — AB (ref 43.5–53.7)
Hemoglobin: 17.5 g/dL (ref 14.1–18.1)
Lymph, poc: 2.6 (ref 0.6–3.4)
MCH, POC: 28.2 pg (ref 27–31.2)
MCHC: 31.5 g/dL — AB (ref 31.8–35.4)
MCV: 89.4 fL (ref 80–97)
MPV: 8.9 fL (ref 0–99.8)
POC Granulocyte: 9.7 — AB (ref 2–6.9)
POC LYMPH PERCENT: 20.4 %L (ref 10–50)
Platelet Count, POC: 211 10*3/uL (ref 142–424)
RBC: 6.2 M/uL — AB (ref 4.69–6.13)
RDW, POC: 17.7 %
WBC: 12.8 10*3/uL — AB (ref 4.6–10.2)

## 2014-01-03 LAB — POCT INR: INR: 1.8
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
## 2014-01-04 LAB — CMP14+EGFR
ALT: 31 IU/L (ref 0–44)
AST: 23 IU/L (ref 0–40)
Albumin/Globulin Ratio: 1.4 (ref 1.1–2.5)
Albumin: 4 g/dL (ref 3.6–4.8)
Alkaline Phosphatase: 134 IU/L — ABNORMAL HIGH (ref 39–117)
BUN/Creatinine Ratio: 14 (ref 10–22)
BUN: 13 mg/dL (ref 8–27)
CO2: 23 mmol/L (ref 18–29)
Calcium: 9.4 mg/dL (ref 8.6–10.2)
Chloride: 103 mmol/L (ref 97–108)
Creatinine, Ser: 0.92 mg/dL (ref 0.76–1.27)
GFR calc Af Amer: 101 mL/min/{1.73_m2} (ref >59)
GFR calc non Af Amer: 87 mL/min/{1.73_m2} (ref >59)
Globulin, Total: 2.9 g/dL (ref 1.5–4.5)
Glucose: 88 mg/dL (ref 65–99)
Potassium: 4.6 mmol/L (ref 3.5–5.2)
Sodium: 144 mmol/L (ref 134–144)
Total Bilirubin: 0.5 mg/dL (ref 0.0–1.2)
Total Protein: 6.9 g/dL (ref 6.0–8.5)

## 2014-01-04 LAB — LIPID PANEL
Chol/HDL Ratio: 2.5 ratio units (ref 0.0–5.0)
Cholesterol, Total: 108 mg/dL (ref 100–199)
HDL: 44 mg/dL (ref >39)
LDL Calculated: 50 mg/dL (ref 0–99)
Triglycerides: 72 mg/dL (ref 0–149)
VLDL Cholesterol Cal: 14 mg/dL (ref 5–40)

## 2014-01-05 ENCOUNTER — Ambulatory Visit (HOSPITAL_COMMUNITY)
Admission: RE | Admit: 2014-01-05 | Discharge: 2014-01-05 | Disposition: A | Discharge status: Home / Self Care | Payer: BC Managed Care – PPO | Source: Ambulatory Visit | Attending: Radiation Oncology | Admitting: Radiation Oncology

## 2014-01-05 ENCOUNTER — Ambulatory Visit
Admission: RE | Admit: 2014-01-05 | Discharge: 2014-01-05 | Disposition: A | Discharge status: Home / Self Care | Payer: BC Managed Care – PPO | Source: Ambulatory Visit | Attending: Radiation Oncology | Admitting: Radiation Oncology

## 2014-01-05 ENCOUNTER — Encounter: Payer: Self-pay | Admitting: Radiation Oncology

## 2014-01-05 DIAGNOSIS — C3490 Malignant neoplasm of unspecified part of unspecified bronchus or lung: Secondary | ICD-10-CM | POA: Insufficient documentation

## 2014-01-05 DIAGNOSIS — J438 Other emphysema: Secondary | ICD-10-CM | POA: Insufficient documentation

## 2014-01-05 DIAGNOSIS — K5710 Diverticulosis of small intestine without perforation or abscess without bleeding: Secondary | ICD-10-CM | POA: Insufficient documentation

## 2014-01-05 DIAGNOSIS — K8020 Calculus of gallbladder without cholecystitis without obstruction: Secondary | ICD-10-CM | POA: Insufficient documentation

## 2014-01-05 DIAGNOSIS — I700 Atherosclerosis of aorta: Secondary | ICD-10-CM | POA: Insufficient documentation

## 2014-01-05 MED ORDER — IOHEXOL 300 MG/ML  SOLN
80.0000 mL | Freq: Once | INTRAMUSCULAR | Status: AC | PRN
  Administered 2014-01-05: 80 mL via INTRAVENOUS

## 2014-01-05 NOTE — Progress Notes (Signed)
Follow up left lung, 11/07/13-11/16/13, walking with cane, no coughing, sob with exertion, room air sats=94%, appetite ggod, still weak/fatigue no c/o pain, nausea, or difficulty swallowing 3:07 PM

## 2014-01-05 NOTE — Progress Notes (Signed)
   Department of Radiation Oncology  Phone:  [phone] Fax:        [phone]   Name: Travis Cruz MRN: [id number]  DOB: Apr 17, 1946  Date: 01/05/2014  Follow Up Visit Note  Diagnosis: T1 N0 left lung cancer  Summary and Interval since last radiation: SBRT to the left upper lobe a total of 60 gray in 5 fractions completed 11/16/2013  Interval History: Travis Cruz presents today for routine followup.  He is doing well. He reports no increase in his respiration symptoms. His pacemaker is been checked out by cardiology. He has not had a scan.  Allergies:  Allergies  Allergen Reactions  . Ativan [Lorazepam] Anxiety and Other (See Comments)    Hyper  [name]    Medications:  Current Outpatient Prescriptions  Medication Sig Dispense Refill  . atorvastatin (LIPITOR) 80 MG tablet Take 80 mg by mouth at bedtime.       . ferrous sulfate 325 (65 FE) MG tablet TAKE 1 TABLET (325 MG TOTAL) BY MOUTH 2 (TWO) TIMES DAILY.  100 tablet  1  . levETIRAcetam (KEPPRA) 500 MG tablet Take 500 mg by mouth 2 (two) times daily.      . Multiple Vitamin (MULTIVITAMIN) tablet Take 1 tablet by mouth daily.      [name] tiotropium (SPIRIVA) 18 MCG inhalation capsule Place 18 mcg into inhaler and inhale daily.      [name] warfarin (COUMADIN) 2 MG tablet Take 1-2 tablets (2-4 mg total) by mouth daily. Takes 4 mg on Monday, Wednesday and Friday and Saturday and 2 mg all other days  30 tablet  4   No current facility-administered medications for this encounter.    Physical Exam:  Filed Vitals:   01/05/14 1502  BP: 122/82  Pulse: 83  Temp: 97.8 F (36.6 C)  TempSrc: Oral  Resp: 20  Weight: 203 lb 9.6 oz (92.352 kg)  SpO2: 94%   alert oriented in no distress sitting comfortably on examining room chair  IMPRESSION: Travis Cruz is a 68 y.o. male status post SBRT for left upper lobe cancer  PLAN:  He needs a CT scan which I've ordered for today. I will see him back in 6  months with a repeat CT at that time.    [name], MD

## 2014-01-10 ENCOUNTER — Telehealth: Payer: Self-pay | Admitting: *Deleted

## 2014-01-10 NOTE — Telephone Encounter (Signed)
CALLED PATIENT TO INFORM OF TEST AND LAB, LVM FOR A RETURN CALL

## 2014-01-24 ENCOUNTER — Encounter: Payer: Self-pay | Admitting: Internal Medicine

## 2014-01-25 ENCOUNTER — Ambulatory Visit (INDEPENDENT_AMBULATORY_CARE_PROVIDER_SITE_OTHER): Payer: BC Managed Care – PPO | Admitting: Pharmacist

## 2014-01-25 DIAGNOSIS — I82409 Acute embolism and thrombosis of unspecified deep veins of unspecified lower extremity: Secondary | ICD-10-CM

## 2014-01-25 DIAGNOSIS — I2699 Other pulmonary embolism without acute cor pulmonale: Secondary | ICD-10-CM

## 2014-01-25 LAB — POCT INR: INR: 2.6

## 2014-01-25 NOTE — Patient Instructions (Signed)
Anticoagulation Dose Instructions as of 01/25/2014     Travis Cruz Tue Wed Thu Fri Sat   New Dose 2 mg 4 mg 2 mg 4 mg 2 mg 4 mg 4 mg    Description       Continue 4mg  ( = 2 tablets) on Mondays, Wednesdays, Fridays and Saturdays and 2mg  (1 tablet) all others.      INR was 2.6 today

## 2014-02-06 ENCOUNTER — Other Ambulatory Visit: Payer: Self-pay | Admitting: Pharmacist

## 2014-02-06 ENCOUNTER — Other Ambulatory Visit: Payer: Self-pay | Admitting: Family Medicine

## 2014-02-22 ENCOUNTER — Ambulatory Visit (INDEPENDENT_AMBULATORY_CARE_PROVIDER_SITE_OTHER): Payer: BC Managed Care – PPO | Admitting: Pharmacist

## 2014-02-22 DIAGNOSIS — I82409 Acute embolism and thrombosis of unspecified deep veins of unspecified lower extremity: Secondary | ICD-10-CM

## 2014-02-22 LAB — POCT INR: INR: 2.1

## 2014-02-22 NOTE — Patient Instructions (Signed)
Anticoagulation Dose Instructions as of 02/22/2014     Travis Cruz Tue Wed Thu Fri Sat   New Dose 2 mg 4 mg 2 mg 4 mg 2 mg 4 mg 4 mg    Description       Continue 4mg  ( = 2 tablets) on Mondays, Wednesdays, Fridays and Saturdays and 2mg  (1 tablet) all others.      INR was 2.1 today

## 2014-02-23 ENCOUNTER — Encounter: Payer: Self-pay | Admitting: Internal Medicine

## 2014-02-28 ENCOUNTER — Encounter: Payer: BC Managed Care – PPO | Admitting: Internal Medicine

## 2014-03-18 ENCOUNTER — Other Ambulatory Visit (HOSPITAL_COMMUNITY): Payer: Self-pay | Admitting: Cardiology

## 2014-03-23 ENCOUNTER — Ambulatory Visit (INDEPENDENT_AMBULATORY_CARE_PROVIDER_SITE_OTHER): Payer: Medicare Other | Admitting: Pharmacist

## 2014-03-23 DIAGNOSIS — I2699 Other pulmonary embolism without acute cor pulmonale: Secondary | ICD-10-CM

## 2014-03-23 DIAGNOSIS — I82409 Acute embolism and thrombosis of unspecified deep veins of unspecified lower extremity: Secondary | ICD-10-CM

## 2014-03-23 LAB — POCT INR: INR: 2.8

## 2014-03-23 NOTE — Patient Instructions (Signed)
Anticoagulation Dose Instructions as of 03/23/2014     Dorene Grebe Tue Wed Thu Fri Sat   New Dose 2 mg 4 mg 2 mg 4 mg 2 mg 4 mg 4 mg    Description       Continue 4mg  ( = 2 tablets) on Mondays, Wednesdays, Fridays and Saturdays and 2mg  (1 tablet) all others.      INR was 2.8 today

## 2014-04-10 ENCOUNTER — Encounter: Payer: Self-pay | Admitting: Internal Medicine

## 2014-04-10 ENCOUNTER — Ambulatory Visit (INDEPENDENT_AMBULATORY_CARE_PROVIDER_SITE_OTHER): Payer: BC Managed Care – PPO | Admitting: Internal Medicine

## 2014-04-10 VITALS — BP 140/77 | HR 101 | Ht 69.0 in | Wt 206.8 lb

## 2014-04-10 DIAGNOSIS — I2589 Other forms of chronic ischemic heart disease: Secondary | ICD-10-CM

## 2014-04-10 DIAGNOSIS — I469 Cardiac arrest, cause unspecified: Secondary | ICD-10-CM

## 2014-04-10 DIAGNOSIS — I255 Ischemic cardiomyopathy: Secondary | ICD-10-CM

## 2014-04-10 DIAGNOSIS — Z95810 Presence of automatic (implantable) cardiac defibrillator: Secondary | ICD-10-CM

## 2014-04-10 LAB — MDC_IDC_ENUM_SESS_TYPE_INCLINIC
Battery Remaining Longevity: 85.2 mo
Brady Statistic RV Percent Paced: 0 %
Date Time Interrogation Session: 20150413143011
HighPow Impedance: 87.75 Ohm
Implantable Pulse Generator Serial Number: 7040910
Lead Channel Impedance Value: 512.5 Ohm
Lead Channel Pacing Threshold Amplitude: 0.75 V
Lead Channel Pacing Threshold Amplitude: 0.75 V
Lead Channel Pacing Threshold Pulse Width: 0.5 ms
Lead Channel Pacing Threshold Pulse Width: 0.5 ms
Lead Channel Sensing Intrinsic Amplitude: 11.8 mV
Lead Channel Setting Pacing Amplitude: 2.5 V
Lead Channel Setting Pacing Pulse Width: 0.5 ms
Lead Channel Setting Sensing Sensitivity: 0.5 mV
Zone Setting Detection Interval: 250 ms
Zone Setting Detection Interval: 300 ms
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
## 2014-04-11 ENCOUNTER — Encounter: Payer: Self-pay | Admitting: Family Medicine

## 2014-04-11 ENCOUNTER — Ambulatory Visit (INDEPENDENT_AMBULATORY_CARE_PROVIDER_SITE_OTHER): Payer: BC Managed Care – PPO | Admitting: Family Medicine

## 2014-04-11 VITALS — BP 134/81 | HR 102 | Temp 97.6°F | Ht 69.0 in | Wt 206.6 lb

## 2014-04-11 DIAGNOSIS — J439 Emphysema, unspecified: Secondary | ICD-10-CM

## 2014-04-11 DIAGNOSIS — J438 Other emphysema: Secondary | ICD-10-CM

## 2014-04-11 DIAGNOSIS — I2510 Atherosclerotic heart disease of native coronary artery without angina pectoris: Secondary | ICD-10-CM

## 2014-04-11 DIAGNOSIS — R569 Unspecified convulsions: Secondary | ICD-10-CM

## 2014-04-11 DIAGNOSIS — D62 Acute posthemorrhagic anemia: Secondary | ICD-10-CM

## 2014-04-11 DIAGNOSIS — I10 Essential (primary) hypertension: Secondary | ICD-10-CM

## 2014-04-11 DIAGNOSIS — E785 Hyperlipidemia, unspecified: Secondary | ICD-10-CM

## 2014-04-11 LAB — POCT CBC
Granulocyte percent: 71.4 %G (ref 37–80)
HCT, POC: 60.4 % — AB (ref 43.5–53.7)
Hemoglobin: 18.5 g/dL — AB (ref 14.1–18.1)
Lymph, poc: 3.2 (ref 0.6–3.4)
MCH, POC: 28.8 pg (ref 27–31.2)
MCHC: 30.7 g/dL — AB (ref 31.8–35.4)
MCV: 93.8 fL (ref 80–97)
MPV: 9.3 fL (ref 0–99.8)
POC Granulocyte: 8.9 — AB (ref 2–6.9)
POC LYMPH PERCENT: 26 %L (ref 10–50)
Platelet Count, POC: 237 10*3/uL (ref 142–424)
RBC: 6.4 M/uL — AB (ref 4.69–6.13)
RDW, POC: 14.8 %
WBC: 12.4 10*3/uL — AB (ref 4.6–10.2)

## 2014-04-11 LAB — POCT INR: INR: 2.9
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
## 2014-04-12 LAB — CMP14+EGFR
ALT: 24 IU/L (ref 0–44)
AST: 24 IU/L (ref 0–40)
Albumin/Globulin Ratio: 1.4 (ref 1.1–2.5)
Albumin: 4.2 g/dL (ref 3.6–4.8)
Alkaline Phosphatase: 134 IU/L — ABNORMAL HIGH (ref 39–117)
BUN/Creatinine Ratio: 11 (ref 10–22)
BUN: 11 mg/dL (ref 8–27)
CO2: 26 mmol/L (ref 18–29)
Calcium: 9.6 mg/dL (ref 8.6–10.2)
Chloride: 102 mmol/L (ref 97–108)
Creatinine, Ser: 1.02 mg/dL (ref 0.76–1.27)
GFR calc Af Amer: 88 mL/min/{1.73_m2} (ref >59)
GFR calc non Af Amer: 76 mL/min/{1.73_m2} (ref >59)
Globulin, Total: 3 g/dL (ref 1.5–4.5)
Glucose: 90 mg/dL (ref 65–99)
Potassium: 4.6 mmol/L (ref 3.5–5.2)
Sodium: 144 mmol/L (ref 134–144)
Total Bilirubin: 0.5 mg/dL (ref 0.0–1.2)
Total Protein: 7.2 g/dL (ref 6.0–8.5)

## 2014-04-12 LAB — LIPID PANEL
Chol/HDL Ratio: 2.4 ratio units (ref 0.0–5.0)
Cholesterol, Total: 122 mg/dL (ref 100–199)
HDL: 50 mg/dL (ref >39)
LDL Calculated: 53 mg/dL (ref 0–99)
Triglycerides: 97 mg/dL (ref 0–149)
VLDL Cholesterol Cal: 19 mg/dL (ref 5–40)

## 2014-04-12 LAB — IRON: Iron: 86 ug/dL (ref 40–155)

## 2014-04-12 LAB — FERRITIN: Ferritin: 173 ng/mL (ref 30–400)

## 2014-04-13 ENCOUNTER — Telehealth: Payer: Self-pay | Admitting: Family Medicine

## 2014-04-13 ENCOUNTER — Other Ambulatory Visit: Payer: Self-pay | Admitting: Family Medicine

## 2014-04-13 DIAGNOSIS — R899 Unspecified abnormal finding in specimens from other organs, systems and tissues: Secondary | ICD-10-CM

## 2014-04-13 NOTE — Progress Notes (Signed)
Quick Note:  Call Patient Labs that are abnormal: WHite cell count and hemoglobin high. Most likely related to Emphysema. And may need to have blood Drawn off. Because of how high the result is.  Recommendations: Needs to see a Hematologist. referral made in EPIC.   ______

## 2014-04-14 NOTE — Telephone Encounter (Signed)
Patient's WBC has ran high since his splenectomy.  He is taking an iron supplement. He wants to know if he can d/c the iron supplement instead of seeing a hematologist. He doesn't want to see one. This has happened before and once he stopped the iron his levels returned to normal.

## 2014-04-15 NOTE — Telephone Encounter (Signed)
Patient wants to speak to nurse.

## 2014-04-17 NOTE — Telephone Encounter (Signed)
Please review Cyril Mourning note she has already spoke with patient. Please advise.Marland KitchenMarland Kitchen

## 2014-04-18 NOTE — Telephone Encounter (Signed)
Spoke with [name]--she states he is going to stop his iron tablets and see if things improve and after reck labs he may be willing to see hematologist. Advised to call prn

## 2014-05-18 ENCOUNTER — Ambulatory Visit (INDEPENDENT_AMBULATORY_CARE_PROVIDER_SITE_OTHER): Payer: BC Managed Care – PPO | Admitting: Pharmacist

## 2014-05-18 DIAGNOSIS — I82409 Acute embolism and thrombosis of unspecified deep veins of unspecified lower extremity: Secondary | ICD-10-CM

## 2014-05-18 DIAGNOSIS — D582 Other hemoglobinopathies: Secondary | ICD-10-CM

## 2014-05-18 DIAGNOSIS — I2699 Other pulmonary embolism without acute cor pulmonale: Secondary | ICD-10-CM

## 2014-05-18 LAB — POCT CBC
Granulocyte percent: 72.8 %G (ref 37–80)
HCT, POC: 56.8 % — AB (ref 43.5–53.7)
Hemoglobin: 17.7 g/dL (ref 14.1–18.1)
Lymph, poc: 3.2 (ref 0.6–3.4)
MCH, POC: 29 pg (ref 27–31.2)
MCHC: 31.2 g/dL — AB (ref 31.8–35.4)
MCV: 93 fL (ref 80–97)
MPV: 9 fL (ref 0–99.8)
POC Granulocyte: 9.3 — AB (ref 2–6.9)
POC LYMPH PERCENT: 24.7 %L (ref 10–50)
Platelet Count, POC: 242 10*3/uL (ref 142–424)
RBC: 6.1 M/uL (ref 4.69–6.13)
RDW, POC: 15 %
WBC: 12.8 10*3/uL — AB (ref 4.6–10.2)

## 2014-05-18 LAB — POCT INR: INR: 4.7

## 2014-05-18 NOTE — Progress Notes (Addendum)
Patient recently stopped iron supplement due to elevated Hemoglobin and hematocrit.   Checked point of care INR manufacture's specifications for HCT.  The last HCT for patient was 60.4 which falls outside of manufacture's range.  I checked CBC today and HCT is still slightly higher than the ranges recommended.  Therefore I am sending INR out for confirmation.    05/19/14 INR sent out was similar to INR check with POC monitor  Recommend patinet hold warfarin for 2 more days (05/19/14 and 05/20/14) then decrease dose to 4mg  on mondays and fridays and 2mg  all other days.  Recheck INR in 3-5 days.

## 2014-05-18 NOTE — Patient Instructions (Signed)
Anticoagulation Dose Instructions as of 05/18/2014     Dorene Grebe Tue Wed Thu Fri Sat   New Dose 2 mg 4 mg 2 mg 4 mg 2 mg 4 mg 2 mg    Description       No warfarin today (05/18/14) or tomorrow (05/19/14), then decrease to 4mg  ( = 2 tablets) on Mondays, Wednesdays and Fridays and 2mg  (1 tablet) all others.      INR was 4.7 (we are sending out to confirm this)

## 2014-05-19 ENCOUNTER — Telehealth: Payer: Self-pay | Admitting: Pharmacist

## 2014-05-19 LAB — PROTIME-INR
INR: 5.3 — ABNORMAL HIGH (ref 0.8–1.2)
Prothrombin Time: 55.6 s — ABNORMAL HIGH (ref 9.1–12.0)

## 2014-05-19 NOTE — Telephone Encounter (Signed)
patinet's wife notified of hold warfarin for next 2 days and then decreased dose to 1 tablet daily except 2 tablets on mondays and fridays.  Appt set for Wednesday, May 27th at 1pm

## 2014-05-24 ENCOUNTER — Ambulatory Visit (INDEPENDENT_AMBULATORY_CARE_PROVIDER_SITE_OTHER): Payer: BC Managed Care – PPO | Admitting: Pharmacist

## 2014-05-24 DIAGNOSIS — I2699 Other pulmonary embolism without acute cor pulmonale: Secondary | ICD-10-CM

## 2014-05-24 DIAGNOSIS — I82409 Acute embolism and thrombosis of unspecified deep veins of unspecified lower extremity: Secondary | ICD-10-CM

## 2014-05-24 LAB — POCT INR: INR: 1.5

## 2014-05-24 NOTE — Patient Instructions (Signed)
Anticoagulation Dose Instructions as of 05/24/2014     Travis Cruz Tue Wed Thu Fri Sat   New Dose 2 mg 4 mg 2 mg 2 mg 2 mg 4 mg 2 mg    Description       Take 1 and 1/2 tablets today,then take warfarin 4mg  ( = 2 tablets) on Mondays and Fridays and 2mg  (1 tablet) all others.      INR was 1.5 today

## 2014-05-31 ENCOUNTER — Other Ambulatory Visit: Payer: Self-pay | Admitting: *Deleted

## 2014-05-31 MED ORDER — TIOTROPIUM BROMIDE MONOHYDRATE 18 MCG IN CAPS: 18.0000 ug | ORAL_CAPSULE | Freq: Every day | RESPIRATORY_TRACT | Status: DC

## 2014-06-05 ENCOUNTER — Ambulatory Visit (INDEPENDENT_AMBULATORY_CARE_PROVIDER_SITE_OTHER): Payer: BC Managed Care – PPO | Admitting: Pharmacist

## 2014-06-05 DIAGNOSIS — I82409 Acute embolism and thrombosis of unspecified deep veins of unspecified lower extremity: Secondary | ICD-10-CM

## 2014-06-05 DIAGNOSIS — I2699 Other pulmonary embolism without acute cor pulmonale: Secondary | ICD-10-CM

## 2014-06-05 LAB — POCT INR: INR: 2

## 2014-06-05 NOTE — Patient Instructions (Signed)
Anticoagulation Dose Instructions as of 06/05/2014     Dorene Grebe Tue Wed Thu Fri Sat   New Dose 2 mg 4 mg 2 mg 2 mg 2 mg 4 mg 2 mg    Description       Continue warfarin 4mg  ( = 2 tablets) on Mondays and Fridays and 2mg  (1 tablet) all others.      INR was 2.0 today

## 2014-06-09 ENCOUNTER — Ambulatory Visit: Payer: Medicare Other | Admitting: Radiation Oncology

## 2014-06-23 ENCOUNTER — Other Ambulatory Visit: Payer: Self-pay | Admitting: *Deleted

## 2014-06-23 MED ORDER — WARFARIN SODIUM 2 MG PO TABS: ORAL_TABLET | ORAL | Status: DC

## 2014-06-29 ENCOUNTER — Ambulatory Visit (INDEPENDENT_AMBULATORY_CARE_PROVIDER_SITE_OTHER): Payer: BC Managed Care – PPO | Admitting: Pharmacist

## 2014-06-29 DIAGNOSIS — I82409 Acute embolism and thrombosis of unspecified deep veins of unspecified lower extremity: Secondary | ICD-10-CM

## 2014-06-29 DIAGNOSIS — I2699 Other pulmonary embolism without acute cor pulmonale: Secondary | ICD-10-CM

## 2014-06-29 LAB — POCT INR: INR: 1.7

## 2014-06-29 NOTE — Patient Instructions (Signed)
Anticoagulation Dose Instructions as of 06/29/2014     Travis Cruz Tue Wed Thu Fri Sat   New Dose 2 mg 4 mg 2 mg 2 mg 2 mg 4 mg 2 mg    Description       Take 2 tablets today and tomorrow, then continue warfarin 4mg  ( = 2 tablets) on Mondays and Fridays and 2mg  (1 tablet) all others.      INR was 1.7 today

## 2014-07-05 ENCOUNTER — Ambulatory Visit (HOSPITAL_COMMUNITY)
Admission: RE | Admit: 2014-07-05 | Discharge: 2014-07-05 | Disposition: A | Discharge status: Home / Self Care | Payer: BC Managed Care – PPO | Source: Ambulatory Visit | Attending: Radiation Oncology | Admitting: Radiation Oncology

## 2014-07-05 ENCOUNTER — Encounter (HOSPITAL_COMMUNITY): Payer: Self-pay

## 2014-07-05 ENCOUNTER — Ambulatory Visit
Admission: RE | Admit: 2014-07-05 | Discharge: 2014-07-05 | Disposition: A | Discharge status: Home / Self Care | Payer: BC Managed Care – PPO | Source: Ambulatory Visit | Attending: Radiation Oncology | Admitting: Radiation Oncology

## 2014-07-05 ENCOUNTER — Other Ambulatory Visit: Payer: Self-pay

## 2014-07-05 DIAGNOSIS — Z9221 Personal history of antineoplastic chemotherapy: Secondary | ICD-10-CM | POA: Insufficient documentation

## 2014-07-05 DIAGNOSIS — C3410 Malignant neoplasm of upper lobe, unspecified bronchus or lung: Secondary | ICD-10-CM

## 2014-07-05 LAB — BUN AND CREATININE (CC13)
BUN: 12.1 mg/dL (ref 7.0–26.0)
Creatinine: 1 mg/dL (ref 0.7–1.3)

## 2014-07-05 MED ORDER — IOHEXOL 300 MG/ML  SOLN
80.0000 mL | Freq: Once | INTRAMUSCULAR | Status: AC | PRN
  Administered 2014-07-05: 80 mL via INTRAVENOUS

## 2014-07-12 ENCOUNTER — Ambulatory Visit (INDEPENDENT_AMBULATORY_CARE_PROVIDER_SITE_OTHER): Payer: BC Managed Care – PPO | Admitting: *Deleted

## 2014-07-12 ENCOUNTER — Telehealth: Payer: Self-pay | Admitting: Radiation Oncology

## 2014-07-12 DIAGNOSIS — I469 Cardiac arrest, cause unspecified: Secondary | ICD-10-CM

## 2014-07-12 NOTE — Progress Notes (Signed)
Remote ICD transmission.   

## 2014-07-12 NOTE — Telephone Encounter (Signed)
SPOKE W/[name] IN REMINDING HER OF Pawel'S APPT W/DR. Pablo Ledger TOMORROW, 7/16. (lw)

## 2014-07-13 ENCOUNTER — Telehealth: Payer: Self-pay | Admitting: *Deleted

## 2014-07-13 ENCOUNTER — Encounter: Payer: Self-pay | Admitting: Radiation Oncology

## 2014-07-13 ENCOUNTER — Ambulatory Visit
Admission: RE | Admit: 2014-07-13 | Discharge: 2014-07-13 | Disposition: A | Discharge status: Home / Self Care | Payer: BC Managed Care – PPO | Source: Ambulatory Visit | Attending: Radiation Oncology | Admitting: Radiation Oncology

## 2014-07-13 VITALS — BP 132/77 | HR 94 | Temp 97.9°F | Resp 20 | Ht 69.0 in | Wt 206.2 lb

## 2014-07-13 DIAGNOSIS — C3410 Malignant neoplasm of upper lobe, unspecified bronchus or lung: Secondary | ICD-10-CM

## 2014-07-13 NOTE — Progress Notes (Addendum)
folow up s/p raidiation left upper lung  11/07/13-11/16/13 , ambulating steady with cane, has yellow safety band left wrist, no c/o pain, coughing,nausea, dizzyness, nor head aches, appetite good, no difficulty swallowing, energy level "fine stated patient, takes Keppra bid, and on coumadin protocol, Ct chest 07/05/14 results in ,here to discuss results 1:46 PM

## 2014-07-13 NOTE — Telephone Encounter (Signed)
CALLED PATIENT TO INFORM OF TEST AND FU VISIT, LVM FOR A RETURN CALL.

## 2014-07-13 NOTE — Progress Notes (Signed)
   Department of Radiation Oncology  Phone:  [phone] Fax:        [phone]   Name: Travis Cruz MRN: [id number]  DOB: 01-05-1946  Date: 07/13/2014  Follow Up Visit Note  Diagnosis:    ICD-9-CM   1. T1N0 SCCA of left upper lobe 162.3 CT Chest Wo Contrast   Summary and Interval since last radiation: SBRT to the left upper lobe to a total dose of 60 gray in 5 fractions completed on 11/16/2013  Interval History: Travis Cruz presents today for routine followup.  He is feeling well and doing well. He's had no hospitalizations. His breathing symptoms are stable. He had a CT of the chest performed last week and he is here for discussion of those results. The CT scan showed increasing radiation change next to the treated lesion. CT was recommended in 3 months  Allergies:  Allergies  Allergen Reactions  . Ativan [Lorazepam] Anxiety and Other (See Comments)    Hyper  [name]    Medications:  Current Outpatient Prescriptions  Medication Sig Dispense Refill  . atorvastatin (LIPITOR) 80 MG tablet TAKE 1 TABLET (80 MG TOTAL) BY MOUTH AT BEDTIME.  30 tablet  6  . levETIRAcetam (KEPPRA) 500 MG tablet Take 500 mg by mouth 2 (two) times daily.      . Multiple Vitamin (MULTIVITAMIN) tablet Take 1 tablet by mouth daily.      [name] tiotropium (SPIRIVA) 18 MCG inhalation capsule Place 1 capsule (18 mcg total) into inhaler and inhale daily.  30 capsule  1  . warfarin (COUMADIN) 2 MG tablet TAKE 4MG  ON MONDAY, WEDNESDAY AND FRIDAY AND 2MG  ALL OTHER DAYS  30 tablet  2   No current facility-administered medications for this encounter.    Physical Exam:  Filed Vitals:   07/13/14 1341  BP: 132/77  Pulse: 94  Temp: 97.9 F (36.6 C)  TempSrc: Oral  Resp: 20  Height: 5\' 9"  (1.753 m)  Weight: 206 lb 4 oz (93.554 kg)  SpO2: 92%   pleasant male in no distress sitting comfortably examining table. Alert and oriented x3.  IMPRESSION: Travis Cruz is a 68 y.o.  male status post SBRT with likely progressive radiation change in the left lung.   PLAN:  I have recommended repeat scans in 3 months. I went over the images with the patient and his wife   [name], MD

## 2014-07-14 LAB — MDC_IDC_ENUM_SESS_TYPE_REMOTE
Battery Remaining Longevity: 6.9
Brady Statistic RV Percent Paced: 1 % — CL
HighPow Impedance: 92 Ohm
Implantable Pulse Generator Serial Number: 7040910
Lead Channel Impedance Value: 480 Ohm
Lead Channel Sensing Intrinsic Amplitude: 11.8 mV
Lead Channel Setting Pacing Amplitude: 2.5 V
Lead Channel Setting Pacing Pulse Width: 0.5 ms
Lead Channel Setting Sensing Sensitivity: 0.5 mV
Zone Setting Detection Interval: 250 ms
Zone Setting Detection Interval: 300 ms

## 2014-07-20 ENCOUNTER — Ambulatory Visit (INDEPENDENT_AMBULATORY_CARE_PROVIDER_SITE_OTHER): Payer: BC Managed Care – PPO | Admitting: Pharmacist

## 2014-07-20 DIAGNOSIS — I2699 Other pulmonary embolism without acute cor pulmonale: Secondary | ICD-10-CM

## 2014-07-20 DIAGNOSIS — I82409 Acute embolism and thrombosis of unspecified deep veins of unspecified lower extremity: Secondary | ICD-10-CM

## 2014-07-20 LAB — POCT INR: INR: 2.6

## 2014-07-20 MED ORDER — WARFARIN SODIUM 2 MG PO TABS: ORAL_TABLET | ORAL | Status: DC

## 2014-07-20 NOTE — Patient Instructions (Signed)
Anticoagulation Dose Instructions as of 07/20/2014     Travis Cruz Tue Wed Thu Fri Sat   New Dose 2 mg 4 mg 2 mg 2 mg 2 mg 4 mg 2 mg    Description       Take 2 tablets today and tomorrow, then continue warfarin 4mg  ( = 2 tablets) on Mondays and Fridays and 2mg  (1 tablet) all others.      INR was 2.6 today

## 2014-07-29 ENCOUNTER — Other Ambulatory Visit: Payer: Self-pay | Admitting: Family Medicine

## 2014-07-31 NOTE — Telephone Encounter (Signed)
Dr. Jacelyn Grip pt.

## 2014-08-02 ENCOUNTER — Encounter: Payer: Self-pay | Admitting: Cardiology

## 2014-08-07 ENCOUNTER — Encounter: Payer: Self-pay | Admitting: Internal Medicine

## 2014-08-24 ENCOUNTER — Ambulatory Visit (INDEPENDENT_AMBULATORY_CARE_PROVIDER_SITE_OTHER): Payer: BC Managed Care – PPO | Admitting: Pharmacist

## 2014-08-24 DIAGNOSIS — I2699 Other pulmonary embolism without acute cor pulmonale: Secondary | ICD-10-CM

## 2014-08-24 DIAGNOSIS — I82409 Acute embolism and thrombosis of unspecified deep veins of unspecified lower extremity: Secondary | ICD-10-CM

## 2014-08-24 LAB — POCT INR: INR: 2.2

## 2014-08-24 NOTE — Patient Instructions (Signed)
Anticoagulation Dose Instructions as of 08/24/2014     Travis Cruz Tue Wed Thu Fri Sat   New Dose 2 mg 4 mg 2 mg 2 mg 2 mg 4 mg 2 mg    Description       Continue warfarin 4mg  ( = 2 tablets) on Mondays and Fridays and 2mg  (1 tablet) all others.      INR was 2.2 today

## 2014-09-07 ENCOUNTER — Encounter: Payer: Self-pay | Admitting: Family Medicine

## 2014-09-07 ENCOUNTER — Ambulatory Visit (INDEPENDENT_AMBULATORY_CARE_PROVIDER_SITE_OTHER): Payer: BC Managed Care – PPO | Admitting: Family Medicine

## 2014-09-07 VITALS — BP 119/87 | HR 101 | Temp 97.4°F | Ht 69.0 in | Wt 207.0 lb

## 2014-09-07 DIAGNOSIS — I6350 Cerebral infarction due to unspecified occlusion or stenosis of unspecified cerebral artery: Secondary | ICD-10-CM

## 2014-09-07 DIAGNOSIS — J449 Chronic obstructive pulmonary disease, unspecified: Secondary | ICD-10-CM

## 2014-09-07 DIAGNOSIS — I2510 Atherosclerotic heart disease of native coronary artery without angina pectoris: Secondary | ICD-10-CM

## 2014-09-07 DIAGNOSIS — I639 Cerebral infarction, unspecified: Secondary | ICD-10-CM

## 2014-09-07 DIAGNOSIS — I2699 Other pulmonary embolism without acute cor pulmonale: Secondary | ICD-10-CM

## 2014-09-07 NOTE — Progress Notes (Signed)
   Subjective:    Patient ID: Travis Cruz, male    DOB: 01/30/1946, 68 y.o.   MRN: [id number]  HPI  68 year old gentleman who is here to followup lipids. He is status post cardiac arrest, CVAs,. He is followed by a cardiologist and electrophysiologist with an implantable fibrillator. He also has a history of seizure disorder and is followed by neurologist in [place]. He has had no recent seizure activity. Reviewing his medical record shows lipids to be in great shape. Sounds like he was placed on Lipitor as secondary prevention after the heart attack. He has no complaints or issues today sleeps well with CPAP and BiPAP. He denies shortness of breath or dependent edema. I think he is not very active but spends most of the day in front of the TV. He is followed here since he is on Coumadin to monitor his PT INR.    Review of Systems  Constitutional: Negative.   Eyes: Negative.   Respiratory: Negative.   Cardiovascular: Negative.   Gastrointestinal: Negative.   Genitourinary: Negative.   Neurological: Negative.   Psychiatric/Behavioral: Negative.        Objective:   Physical Exam  Constitutional: He is oriented to person, place, and time. He appears well-developed and well-nourished.  HENT:  Head: Normocephalic.  Right Ear: External ear normal.  Left Ear: External ear normal.  Nose: Nose normal.  Mouth/Throat: Oropharynx is clear and moist.  Eyes: Conjunctivae and EOM are normal. Pupils are equal, round, and reactive to light.  Neck: Normal range of motion. Neck supple.  Cardiovascular: Normal rate, regular rhythm, normal heart sounds and intact distal pulses.   Pulmonary/Chest: Effort normal and breath sounds normal.  Abdominal: Soft. Bowel sounds are normal.  Musculoskeletal: Normal range of motion.  Weakness LLL  Neurological: He is alert and oriented to person, place, and time.  Skin: Skin is warm and dry.  Psychiatric: He has a normal mood and affect. His behavior  is normal. Judgment and thought content normal.   BP 119/87  Pulse 101  Temp(Src) 97.4 F (36.3 C) (Oral)  Ht 5\' 9"  (1.753 m)  Wt 207 lb (93.895 kg)  BMI 30.55 kg/m2       Assessment & Plan:  1. PE (pulmonary embolism) PT is at goal  2. CVA (cerebral vascular accident) Uses a cane but does not feel residua post CVA  3. Atherosclerosis of native coronary artery of native heart without angina pectoris Denies CP or sx of CHF or arrhythmia  42. COPD GOLD II Asymptomatic on Spiriva  Wardell Honour MD

## 2014-09-20 ENCOUNTER — Encounter: Payer: Self-pay | Admitting: Family Medicine

## 2014-10-05 ENCOUNTER — Ambulatory Visit (INDEPENDENT_AMBULATORY_CARE_PROVIDER_SITE_OTHER): Payer: BC Managed Care – PPO | Admitting: Pharmacist

## 2014-10-05 DIAGNOSIS — I2699 Other pulmonary embolism without acute cor pulmonale: Secondary | ICD-10-CM

## 2014-10-05 DIAGNOSIS — I82409 Acute embolism and thrombosis of unspecified deep veins of unspecified lower extremity: Secondary | ICD-10-CM

## 2014-10-05 LAB — POCT INR: INR: 1.8

## 2014-10-05 NOTE — Patient Instructions (Signed)
Anticoagulation Dose Instructions as of 10/05/2014     Travis Cruz Tue Wed Thu Fri Sat   New Dose 2 mg 4 mg 2 mg 2 mg 2 mg 4 mg 2 mg    Description       Take 2 tablets today - THursday, October 8th, then continue warfarin 4mg  ( = 2 tablets) on Mondays and Fridays and 2mg  (1 tablet) all others.      INR was 1.8 today

## 2014-10-13 ENCOUNTER — Ambulatory Visit (HOSPITAL_COMMUNITY)
Admission: RE | Admit: 2014-10-13 | Discharge: 2014-10-13 | Disposition: A | Discharge status: Home / Self Care | Payer: BC Managed Care – PPO | Source: Ambulatory Visit | Attending: Radiation Oncology | Admitting: Radiation Oncology

## 2014-10-13 ENCOUNTER — Encounter (HOSPITAL_COMMUNITY): Payer: Self-pay

## 2014-10-13 DIAGNOSIS — C3410 Malignant neoplasm of upper lobe, unspecified bronchus or lung: Secondary | ICD-10-CM

## 2014-10-13 DIAGNOSIS — Z923 Personal history of irradiation: Secondary | ICD-10-CM | POA: Diagnosis not present

## 2014-10-16 ENCOUNTER — Ambulatory Visit (INDEPENDENT_AMBULATORY_CARE_PROVIDER_SITE_OTHER): Payer: BC Managed Care – PPO | Admitting: *Deleted

## 2014-10-16 ENCOUNTER — Encounter: Payer: Self-pay | Admitting: Internal Medicine

## 2014-10-16 DIAGNOSIS — I469 Cardiac arrest, cause unspecified: Secondary | ICD-10-CM

## 2014-10-16 NOTE — Progress Notes (Signed)
Remote ICD transmission.   

## 2014-10-17 LAB — MDC_IDC_ENUM_SESS_TYPE_REMOTE
Battery Remaining Longevity: 82 mo
Battery Remaining Percentage: 81 %
Battery Voltage: 2.99 V
Brady Statistic RV Percent Paced: 1 %
Date Time Interrogation Session: 20151019080016
HighPow Impedance: 91 Ohm
HighPow Impedance: 91 Ohm
Implantable Pulse Generator Serial Number: 7040910
Lead Channel Impedance Value: 480 Ohm
Lead Channel Pacing Threshold Amplitude: 0.75 V
Lead Channel Pacing Threshold Pulse Width: 0.5 ms
Lead Channel Sensing Intrinsic Amplitude: 11.8 mV
Lead Channel Setting Pacing Amplitude: 2.5 V
Lead Channel Setting Pacing Pulse Width: 0.5 ms
Lead Channel Setting Sensing Sensitivity: 0.5 mV
Zone Setting Detection Interval: 250 ms
Zone Setting Detection Interval: 300 ms

## 2014-10-19 ENCOUNTER — Ambulatory Visit: Payer: BC Managed Care – PPO | Admitting: Radiation Oncology

## 2014-10-20 ENCOUNTER — Encounter: Payer: Self-pay | Admitting: Radiation Oncology

## 2014-10-20 ENCOUNTER — Ambulatory Visit
Admission: RE | Admit: 2014-10-20 | Discharge: 2014-10-20 | Disposition: A | Discharge status: Home / Self Care | Payer: BC Managed Care – PPO | Source: Ambulatory Visit | Attending: Radiation Oncology | Admitting: Radiation Oncology

## 2014-10-20 VITALS — BP 134/78 | HR 97 | Temp 97.8°F | Resp 20 | Ht 69.0 in | Wt 206.4 lb

## 2014-10-20 DIAGNOSIS — C3412 Malignant neoplasm of upper lobe, left bronchus or lung: Secondary | ICD-10-CM

## 2014-10-20 NOTE — Progress Notes (Addendum)
[name] here for follow up after treatment to his left upper lobe.  He denies pain, cough, hemoptysis and shortness of breath.  He reports he does not have the energy to do things he used to do.

## 2014-10-21 NOTE — Progress Notes (Signed)
   Department of Radiation Oncology  Phone:  [phone] Fax:        [phone]   Name: Jiyan Walkowski MRN: [id number]  DOB: 01/19/1946  Date: 10/20/2014  Follow Up Visit Note  Diagnosis:    ICD-9-CM ICD-10-CM  1. Malignant neoplasm of upper lobe of left lung 162.3 C34.12   Summary and Interval since last radiation: SBRT to the left upper lobe to a total dose of 60 gray in 5 fractions completed on 11/16/2013  Interval History: Terique presents today for routine followup.  He is feeling well and doing well. He's had no hospitalizations. His breathing symptoms are stable. He had a CT of the chest performed for short interval follow up of lesions in the left lung and is here for those results. The CT showed stability of the post treatment change with no progressive disease or lymphadenopathy.   Allergies:  Allergies  Allergen Reactions  . Ativan [Lorazepam] Anxiety and Other (See Comments)    Hyper  [name]    Medications:  Current Outpatient Prescriptions  Medication Sig Dispense Refill  . atorvastatin (LIPITOR) 80 MG tablet TAKE 1 TABLET (80 MG TOTAL) BY MOUTH AT BEDTIME.  30 tablet  6  . levETIRAcetam (KEPPRA) 500 MG tablet Take 500 mg by mouth 2 (two) times daily.      . Multiple Vitamin (MULTIVITAMIN) tablet Take 1 tablet by mouth daily.      [name] SPIRIVA HANDIHALER 18 MCG inhalation capsule PLACE 1 CAPSULE (18 MCG TOTAL) INTO INHALER AND INHALE DAILY.  30 capsule  3  . warfarin (COUMADIN) 2 MG tablet TAKE 4MG  (=2 tablets) ON MONDAYS, WEDNESDAYS AND FRIDAYS AND 2MG  ALL OTHER DAYS  135 tablet  1   No current facility-administered medications for this encounter.    Physical Exam:  Filed Vitals:   10/20/14 1457  BP: 134/78  Pulse: 97  Temp: 97.8 F (36.6 C)  TempSrc: Oral  Resp: 20  Height: 5\' 9"  (1.753 m)  Weight: 206 lb 6.4 oz (93.622 kg)  SpO2: 92%   pleasant male in no distress sitting comfortably examining table. Alert  and oriented x3.  IMPRESSION: Beatrice is a 68 y.o. male status post SBRT, currently NED.   PLAN:  I have recommended repeat scans in 6 months. HE can call with any changes in the interim.    [name], MD

## 2014-10-26 ENCOUNTER — Other Ambulatory Visit (HOSPITAL_COMMUNITY): Payer: Self-pay | Admitting: Internal Medicine

## 2014-11-01 ENCOUNTER — Encounter: Payer: Self-pay | Admitting: Cardiology

## 2014-11-08 ENCOUNTER — Ambulatory Visit (INDEPENDENT_AMBULATORY_CARE_PROVIDER_SITE_OTHER): Payer: BC Managed Care – PPO | Admitting: Pharmacist

## 2014-11-08 DIAGNOSIS — I82409 Acute embolism and thrombosis of unspecified deep veins of unspecified lower extremity: Secondary | ICD-10-CM

## 2014-11-08 DIAGNOSIS — I2699 Other pulmonary embolism without acute cor pulmonale: Secondary | ICD-10-CM

## 2014-11-08 LAB — POCT INR: INR: 1.9

## 2014-11-08 NOTE — Patient Instructions (Signed)
Anticoagulation Dose Instructions as of 11/08/2014      Travis Cruz Tue Wed Thu Fri Sat   New Dose 2 mg 4 mg 2 mg 2 mg 2 mg 4 mg 2 mg    Description        Take 2 tablets today - TWednesday, November 11th, then continue warfarin 4mg  ( = 2 tablets) on Mondays and Fridays and 2mg  (1 tablet) all others.     INR was 1.9 today

## 2014-11-23 ENCOUNTER — Other Ambulatory Visit: Payer: Self-pay | Admitting: Nurse Practitioner

## 2014-12-06 ENCOUNTER — Ambulatory Visit (INDEPENDENT_AMBULATORY_CARE_PROVIDER_SITE_OTHER): Payer: BC Managed Care – PPO | Admitting: Pharmacist

## 2014-12-06 DIAGNOSIS — I82409 Acute embolism and thrombosis of unspecified deep veins of unspecified lower extremity: Secondary | ICD-10-CM

## 2014-12-06 DIAGNOSIS — Z23 Encounter for immunization: Secondary | ICD-10-CM

## 2014-12-06 DIAGNOSIS — I2699 Other pulmonary embolism without acute cor pulmonale: Secondary | ICD-10-CM

## 2014-12-06 LAB — POCT INR: INR: 2.2

## 2014-12-06 NOTE — Patient Instructions (Signed)
Anticoagulation Dose Instructions as of 12/06/2014      Travis Cruz Tue Wed Thu Fri Sat   New Dose 2 mg 4 mg 2 mg 2 mg 2 mg 4 mg 2 mg    Description        Continue warfarin 4mg  ( = 2 tablets) on Mondays and Fridays and 2mg  (1 tablet) all others.      INR was 2.2 today

## 2014-12-07 ENCOUNTER — Encounter (HOSPITAL_COMMUNITY): Payer: Self-pay | Admitting: Cardiology

## 2015-01-02 ENCOUNTER — Other Ambulatory Visit: Payer: Self-pay | Admitting: Family Medicine

## 2015-01-17 ENCOUNTER — Ambulatory Visit (INDEPENDENT_AMBULATORY_CARE_PROVIDER_SITE_OTHER): Payer: BLUE CROSS/BLUE SHIELD | Admitting: Pharmacist

## 2015-01-17 DIAGNOSIS — I2699 Other pulmonary embolism without acute cor pulmonale: Secondary | ICD-10-CM

## 2015-01-17 DIAGNOSIS — I82409 Acute embolism and thrombosis of unspecified deep veins of unspecified lower extremity: Secondary | ICD-10-CM

## 2015-01-17 LAB — POCT INR: INR: 1.8

## 2015-01-17 NOTE — Patient Instructions (Signed)
Anticoagulation Dose Instructions as of 01/17/2015      Travis Cruz Tue Wed Thu Fri Sat   New Dose 2 mg 4 mg 2 mg 4 mg 2 mg 4 mg 2 mg    Description        Increase warfarin to 4mg  ( = 2 tablets) on Mondays, Wednesdays and Fridays and 2mg  (1 tablet) all others.      INR was 1.8 today

## 2015-01-18 ENCOUNTER — Ambulatory Visit (INDEPENDENT_AMBULATORY_CARE_PROVIDER_SITE_OTHER): Payer: BC Managed Care – PPO | Admitting: *Deleted

## 2015-01-18 ENCOUNTER — Encounter: Payer: Self-pay | Admitting: Internal Medicine

## 2015-01-18 DIAGNOSIS — I469 Cardiac arrest, cause unspecified: Secondary | ICD-10-CM

## 2015-01-18 DIAGNOSIS — I5022 Chronic systolic (congestive) heart failure: Secondary | ICD-10-CM

## 2015-01-18 LAB — MDC_IDC_ENUM_SESS_TYPE_REMOTE
Battery Remaining Longevity: 80 mo
Battery Remaining Percentage: 79 %
Battery Voltage: 2.98 V
Brady Statistic RV Percent Paced: 1 %
Date Time Interrogation Session: 20160121070017
HighPow Impedance: 90 Ohm
HighPow Impedance: 90 Ohm
Implantable Pulse Generator Serial Number: 7040910
Lead Channel Impedance Value: 490 Ohm
Lead Channel Pacing Threshold Amplitude: 0.75 V
Lead Channel Pacing Threshold Pulse Width: 0.5 ms
Lead Channel Sensing Intrinsic Amplitude: 11.8 mV
Lead Channel Setting Pacing Amplitude: 2.5 V
Lead Channel Setting Pacing Pulse Width: 0.5 ms
Lead Channel Setting Sensing Sensitivity: 0.5 mV
Zone Setting Detection Interval: 250 ms
Zone Setting Detection Interval: 300 ms

## 2015-01-18 NOTE — Progress Notes (Signed)
Remote ICD transmission.   

## 2015-01-25 ENCOUNTER — Other Ambulatory Visit: Payer: Self-pay | Admitting: Cardiology

## 2015-01-30 ENCOUNTER — Other Ambulatory Visit: Payer: Self-pay | Admitting: Family Medicine

## 2015-02-05 ENCOUNTER — Encounter: Payer: Self-pay | Admitting: Cardiology

## 2015-02-15 ENCOUNTER — Ambulatory Visit (INDEPENDENT_AMBULATORY_CARE_PROVIDER_SITE_OTHER): Payer: BLUE CROSS/BLUE SHIELD | Admitting: Pharmacist

## 2015-02-15 DIAGNOSIS — I2699 Other pulmonary embolism without acute cor pulmonale: Secondary | ICD-10-CM | POA: Diagnosis not present

## 2015-02-15 DIAGNOSIS — I82409 Acute embolism and thrombosis of unspecified deep veins of unspecified lower extremity: Secondary | ICD-10-CM | POA: Diagnosis not present

## 2015-02-15 LAB — POCT INR: INR: 2.1

## 2015-02-15 NOTE — Patient Instructions (Signed)
Anticoagulation Dose Instructions as of 02/15/2015      Travis Cruz Tue Wed Thu Fri Sat   New Dose 2 mg 4 mg 2 mg 4 mg 2 mg 4 mg 2 mg    Description        Increase warfarin to 4mg  ( = 2 tablets) on Mondays, Wednesdays and Fridays and 2mg  (1 tablet) all others.      INR was 2.1 today

## 2015-03-22 ENCOUNTER — Other Ambulatory Visit: Payer: Self-pay | Admitting: *Deleted

## 2015-03-22 MED ORDER — TIOTROPIUM BROMIDE MONOHYDRATE 18 MCG IN CAPS: ORAL_CAPSULE | RESPIRATORY_TRACT | Status: DC

## 2015-03-28 ENCOUNTER — Ambulatory Visit (INDEPENDENT_AMBULATORY_CARE_PROVIDER_SITE_OTHER): Payer: BLUE CROSS/BLUE SHIELD | Admitting: Pharmacist

## 2015-03-28 DIAGNOSIS — I82409 Acute embolism and thrombosis of unspecified deep veins of unspecified lower extremity: Secondary | ICD-10-CM

## 2015-03-28 DIAGNOSIS — I2699 Other pulmonary embolism without acute cor pulmonale: Secondary | ICD-10-CM

## 2015-03-28 LAB — POCT INR: INR: 2.2

## 2015-03-28 NOTE — Patient Instructions (Signed)
Anticoagulation Dose Instructions as of 03/28/2015      Dorene Grebe Tue Wed Thu Fri Sat   New Dose 2 mg 4 mg 2 mg 4 mg 2 mg 4 mg 2 mg    Description        Continue warfarin to 4mg  ( = 2 tablets) on Mondays, Wednesdays and Fridays and 2mg  (1 tablet) all others.     INR was 2.2 today

## 2015-04-02 ENCOUNTER — Other Ambulatory Visit: Payer: Self-pay | Admitting: *Deleted

## 2015-04-02 MED ORDER — TIOTROPIUM BROMIDE MONOHYDRATE 18 MCG IN CAPS: ORAL_CAPSULE | RESPIRATORY_TRACT | Status: DC

## 2015-04-19 ENCOUNTER — Encounter: Payer: Self-pay | Admitting: Internal Medicine

## 2015-04-19 ENCOUNTER — Ambulatory Visit (INDEPENDENT_AMBULATORY_CARE_PROVIDER_SITE_OTHER): Payer: BLUE CROSS/BLUE SHIELD | Admitting: Internal Medicine

## 2015-04-19 VITALS — BP 109/82 | HR 89 | Ht 69.0 in | Wt 210.0 lb

## 2015-04-19 DIAGNOSIS — I469 Cardiac arrest, cause unspecified: Secondary | ICD-10-CM

## 2015-04-19 DIAGNOSIS — I255 Ischemic cardiomyopathy: Secondary | ICD-10-CM

## 2015-04-19 DIAGNOSIS — Z4502 Encounter for adjustment and management of automatic implantable cardiac defibrillator: Secondary | ICD-10-CM | POA: Diagnosis not present

## 2015-04-19 DIAGNOSIS — I5022 Chronic systolic (congestive) heart failure: Secondary | ICD-10-CM | POA: Diagnosis not present

## 2015-04-19 LAB — MDC_IDC_ENUM_SESS_TYPE_INCLINIC
Battery Remaining Longevity: 78 mo
Brady Statistic RV Percent Paced: 0 %
Date Time Interrogation Session: 20160421120208
HighPow Impedance: 90 Ohm
Implantable Pulse Generator Serial Number: 7040910
Lead Channel Impedance Value: 487.5 Ohm
Lead Channel Pacing Threshold Amplitude: 0.75 V
Lead Channel Pacing Threshold Pulse Width: 0.5 ms
Lead Channel Sensing Intrinsic Amplitude: 11.8 mV
Lead Channel Setting Pacing Amplitude: 2.5 V
Lead Channel Setting Pacing Pulse Width: 0.5 ms
Lead Channel Setting Sensing Sensitivity: 0.5 mV
Zone Setting Detection Interval: 250 ms
Zone Setting Detection Interval: 300 ms

## 2015-04-19 NOTE — Patient Instructions (Signed)
Medication Instructions:  Your physician recommends that you continue on your current medications as directed. Please refer to the Current Medication list given to you today.  Labwork: None  Testing/Procedures: None  Follow-Up: Remote monitoring is used to monitor your Pacemaker of ICD from home. This monitoring reduces the number of office visits required to check your device to one time per year. It allows [place] to keep an eye on the functioning of your device to ensure it is working properly. You are scheduled for a device check from home on 07/19/15. You may send your transmission at any time that day. If you have a wireless device, the transmission will be sent automatically. After your physician reviews your transmission, you will receive a postcard with your next transmission date.  Your physician wants you to follow-up in: 1 year with Dr. Caryl Comes.  You will receive a reminder letter in the mail two months in advance. If you don't receive a letter, please call our office to schedule the follow-up appointment.    Thank you for choosing [facility]!!
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
## 2015-04-24 ENCOUNTER — Encounter: Payer: Self-pay | Admitting: Family Medicine

## 2015-04-24 ENCOUNTER — Ambulatory Visit (INDEPENDENT_AMBULATORY_CARE_PROVIDER_SITE_OTHER): Payer: BLUE CROSS/BLUE SHIELD | Admitting: Family Medicine

## 2015-04-24 VITALS — BP 124/87 | HR 92 | Temp 97.2°F | Ht 69.0 in | Wt 210.0 lb

## 2015-04-24 DIAGNOSIS — I82409 Acute embolism and thrombosis of unspecified deep veins of unspecified lower extremity: Secondary | ICD-10-CM | POA: Diagnosis not present

## 2015-04-24 DIAGNOSIS — J449 Chronic obstructive pulmonary disease, unspecified: Secondary | ICD-10-CM

## 2015-04-24 DIAGNOSIS — E785 Hyperlipidemia, unspecified: Secondary | ICD-10-CM | POA: Diagnosis not present

## 2015-04-24 DIAGNOSIS — R569 Unspecified convulsions: Secondary | ICD-10-CM

## 2015-04-24 DIAGNOSIS — I10 Essential (primary) hypertension: Secondary | ICD-10-CM

## 2015-04-24 NOTE — Progress Notes (Signed)
Subjective:    Patient ID: Travis Cruz, male    DOB: 09-25-1946, 69 y.o.   MRN: [id number]   HPI 69 year old male here to follow-up lipids and blood pressure. Past history of CVA and COPD as well. He gets PT INRs checked regularly here as he also had DVT. No current problems or symptoms it has been a year since we last checked lipids liver. Wife also relates some urinary frequency. There is no history of diabetes and no nocturia I suspect this may be related to enlarging prostate but will check sugar today Chief Complaint  Patient presents with  . Hyperlipidemia  . Hypertension  . COPD  . DVT    hx of DVT. Needs INR checked today.   Patient Active Problem List   Diagnosis Date Noted  . COPD (chronic obstructive pulmonary disease)   . Implantable cardioverter-defibrillator single chamber [employer] 10/05/2013  . Cancer of upper lobe of lung 09/16/2013  . Low hemoglobin 07/27/2013  . Acute blood loss anemia 07/20/2013  . Post-polypectomy bleeding 07/14/2013  . Rectal bleeding 07/11/2013  . Rectal bleed 06/27/2013  . Hypotension 06/27/2013  . Anemia associated with acute blood loss 06/27/2013  . Abnormal chest x-ray 05/13/2013  . COPD exacerbation 05/13/2013  . HLD (hyperlipidemia) 05/13/2013  . CAD (coronary atherosclerotic disease) 05/13/2013  . Solitary pulmonary nodule 03/30/2013  . Personal history of other diseases of circulatory system 03/28/2013  . Transient ischemic attack (TIA), and cerebral infarction without residual deficits 03/28/2013  . H/O ventricular fibrillation 03/28/2013  . Heart attack 03/28/2013  . DVT (deep venous thrombosis), unspecified laterality 03/21/2013  . Ischemic cardiomyopathy 03/15/2013  . NSTEMI (non-ST elevated myocardial infarction) 01/27/2013  . Cardiac arrest 01/22/2013  . Seizure-like activity 01/22/2013  . Infected sebaceous cyst of back 07/22/2012  . Pulmonary emphysema 03/10/2012  . Obstructive sleep apnea 03/10/2012  .  Hypoxemia 03/10/2012  . Obstructive sleep apnea of adult 03/10/2012  . Patent arterial duct 03/10/2012  . Open fracture of part of fibula with tibia 10/06/2011  . Open supracondylar fracture of femur 10/06/2011  . Fracture of femur, supracondylar, open 10/06/2011  . Fracture of fibula with tibia, open 10/06/2011  . COPD GOLD II 03/24/2011  . HTN (hypertension) 03/24/2011  . CVA (cerebral vascular accident) 12/29/2009  . Deep vein thrombosis (DVT) 09/23/2008  . PE (pulmonary embolism) 09/23/2008   Outpatient Encounter Prescriptions as of 04/24/2015  Medication Sig  . levETIRAcetam (KEPPRA) 500 MG tablet TAKE 1 TABLET (500 MG TOTAL) BY MOUTH 2 TIMES DAILY.  . Multiple Vitamin (MULTIVITAMIN) tablet Take 1 tablet by mouth daily.  [name] tiotropium (SPIRIVA HANDIHALER) 18 MCG inhalation capsule PLACE 1 CAPSULE (18 MCG TOTAL) INTO INHALER AND INHALE DAILY.  [name] warfarin (COUMADIN) 2 MG tablet TAKE 4MG (=2 TABLETS) ON MONDAYS, WEDNESDAYS AND FRIDAYS AND 2MG ALL OTHER DAYS  . atorvastatin (LIPITOR) 80 MG tablet TAKE 1 TABLET (80 MG TOTAL) BY MOUTH AT BEDTIME. (Patient not taking: Reported on 04/24/2015)      Review of Systems  HENT: Negative.   Respiratory: Negative.   Genitourinary: Positive for frequency.  Neurological: Positive for weakness.  Psychiatric/Behavioral: Negative.        Objective:   Physical Exam  Constitutional: He is oriented to person, place, and time. He appears well-developed and well-nourished.  Cardiovascular: Normal rate and regular rhythm.   Pulmonary/Chest: Effort normal.  Neurological: He is alert and oriented to person, place, and time. No cranial nerve deficit. Coordination abnormal.  Uses cane secondary  weakness secondary to CVA    BP 124/87 mmHg  Pulse 92  Temp(Src) 97.2 F (36.2 C) (Oral)  Ht '5\' 9"'  (1.753 m)  Wt 210 lb (95.255 kg)  BMI 31.00 kg/m2       Assessment & Plan:  1. Essential hypertension On no medicines and blood pressure is well  controlled  2. HLD (hyperlipidemia) Seems to be having some myalgias we talked about reducing the dose or frequency of the statin and will try that - CMP14+EGFR - Lipid panel  3. DVT (deep venous thrombosis), unspecified laterality Schedule for routine PT/INR today  4. Chronic obstructive pulmonary disease, unspecified COPD, unspecified chronic bronchitis type Uses Spiriva with success has BiPAP at at night  5. Seizure-like activity No seizures but anticonvulsant level has not been checked in some time - Levetiracetam level. This is managed by neurology at [facility]   Wardell Honour MD

## 2015-04-26 LAB — CMP14+EGFR
ALT: 27 IU/L (ref 0–44)
AST: 24 IU/L (ref 0–40)
Albumin/Globulin Ratio: 1.3 (ref 1.1–2.5)
Albumin: 4 g/dL (ref 3.6–4.8)
Alkaline Phosphatase: 136 IU/L — ABNORMAL HIGH (ref 39–117)
BUN/Creatinine Ratio: 15 (ref 10–22)
BUN: 14 mg/dL (ref 8–27)
Bilirubin Total: 0.5 mg/dL (ref 0.0–1.2)
CO2: 27 mmol/L (ref 18–29)
Calcium: 10 mg/dL (ref 8.6–10.2)
Chloride: 100 mmol/L (ref 97–108)
Creatinine, Ser: 0.96 mg/dL (ref 0.76–1.27)
GFR calc Af Amer: 94 mL/min/{1.73_m2} (ref >59)
GFR calc non Af Amer: 81 mL/min/{1.73_m2} (ref >59)
Globulin, Total: 3.1 g/dL (ref 1.5–4.5)
Glucose: 91 mg/dL (ref 65–99)
Potassium: 5.4 mmol/L — ABNORMAL HIGH (ref 3.5–5.2)
Sodium: 140 mmol/L (ref 134–144)
Total Protein: 7.1 g/dL (ref 6.0–8.5)

## 2015-04-26 LAB — LIPID PANEL
Chol/HDL Ratio: 3.2 ratio units (ref 0.0–5.0)
Cholesterol, Total: 171 mg/dL (ref 100–199)
HDL: 53 mg/dL (ref >39)
LDL Calculated: 95 mg/dL (ref 0–99)
Triglycerides: 113 mg/dL (ref 0–149)
VLDL Cholesterol Cal: 23 mg/dL (ref 5–40)

## 2015-04-26 LAB — LEVETIRACETAM LEVEL: Levetiracetam Lvl: 21.9 ug/mL (ref 10.0–40.0)

## 2015-04-27 ENCOUNTER — Ambulatory Visit (INDEPENDENT_AMBULATORY_CARE_PROVIDER_SITE_OTHER): Payer: BLUE CROSS/BLUE SHIELD | Admitting: Pharmacist

## 2015-04-27 ENCOUNTER — Telehealth: Payer: Self-pay | Admitting: Family Medicine

## 2015-04-27 DIAGNOSIS — I82409 Acute embolism and thrombosis of unspecified deep veins of unspecified lower extremity: Secondary | ICD-10-CM | POA: Diagnosis not present

## 2015-04-27 DIAGNOSIS — I2699 Other pulmonary embolism without acute cor pulmonale: Secondary | ICD-10-CM | POA: Diagnosis not present

## 2015-04-27 LAB — POCT INR: INR: 2.6

## 2015-04-27 NOTE — Patient Instructions (Signed)
Anticoagulation Dose Instructions as of 04/27/2015      Travis Cruz Tue Wed Thu Fri Sat   New Dose 2 mg 4 mg 2 mg 4 mg 2 mg 4 mg 2 mg    Description        Continue warfarin to '4mg'$  ( = 2 tablets) on Mondays, Wednesdays and Fridays and '2mg'$  (1 tablet) all others.

## 2015-04-27 NOTE — Progress Notes (Signed)
Patient was here 04/24/2015 but INR was missed.  RTC today for INR.  Discussed with practice manager about not charging for visit today since patient has HSA and a deductible.  Rob OK'd no charge.

## 2015-04-27 NOTE — Telephone Encounter (Signed)
INR was not done and appointment given for today with Tammy.

## 2015-05-02 ENCOUNTER — Encounter: Payer: Self-pay | Admitting: Internal Medicine

## 2015-05-04 ENCOUNTER — Telehealth: Payer: Self-pay | Admitting: Family Medicine

## 2015-05-04 NOTE — Telephone Encounter (Signed)
Notified that this letter was faxed last week.  She is going to check with the [facility] office and see if they have it on file.

## 2015-05-08 ENCOUNTER — Other Ambulatory Visit: Payer: Self-pay

## 2015-05-08 MED ORDER — WARFARIN SODIUM 2 MG PO TABS: ORAL_TABLET | ORAL | Status: DC

## 2015-06-07 ENCOUNTER — Ambulatory Visit (INDEPENDENT_AMBULATORY_CARE_PROVIDER_SITE_OTHER): Payer: BLUE CROSS/BLUE SHIELD | Admitting: Pharmacist

## 2015-06-07 DIAGNOSIS — I82409 Acute embolism and thrombosis of unspecified deep veins of unspecified lower extremity: Secondary | ICD-10-CM | POA: Diagnosis not present

## 2015-06-07 LAB — POCT INR: INR: 2

## 2015-06-07 MED ORDER — ATORVASTATIN CALCIUM 80 MG PO TABS: ORAL_TABLET | ORAL | Status: DC

## 2015-06-07 NOTE — Progress Notes (Signed)
Patient reported that he was only taking atorvastatin '80mg'$  3 times per week as he was instructed by Dr Loreta Ave.  Dose decrease was because patient has been c/o legs hurting.  THis has improved some with decrease in atrovastatin dose.

## 2015-06-07 NOTE — Patient Instructions (Signed)
Anticoagulation Dose Instructions as of 06/07/2015      Dorene Grebe Tue Wed Thu Fri Sat   New Dose 2 mg 4 mg 2 mg 4 mg 2 mg 4 mg 2 mg    Description        Continue warfarin to '4mg'$  ( = 2 tablets) on Mondays, Wednesdays and Fridays and '2mg'$  (1 tablet) all others.     INR was 2.0 today

## 2015-06-26 ENCOUNTER — Encounter: Payer: Self-pay | Admitting: Radiation Oncology

## 2015-06-27 ENCOUNTER — Telehealth: Payer: Self-pay

## 2015-06-27 NOTE — Telephone Encounter (Signed)
Left message that our office will cal him next  with appointments for ct chest and follow up with Dr.Wentworth.

## 2015-07-03 ENCOUNTER — Other Ambulatory Visit: Payer: Self-pay | Admitting: Radiation Oncology

## 2015-07-03 DIAGNOSIS — C3412 Malignant neoplasm of upper lobe, left bronchus or lung: Secondary | ICD-10-CM

## 2015-07-04 ENCOUNTER — Telehealth: Payer: Self-pay | Admitting: *Deleted

## 2015-07-04 NOTE — Telephone Encounter (Signed)
CALLED PATIENT TO INFORM OF CT AND FU, LVM FOR A RETURN CALL

## 2015-07-09 ENCOUNTER — Ambulatory Visit (HOSPITAL_COMMUNITY)
Admission: RE | Admit: 2015-07-09 | Discharge: 2015-07-09 | Disposition: A | Discharge status: Home / Self Care | Payer: BLUE CROSS/BLUE SHIELD | Source: Ambulatory Visit | Attending: Radiation Oncology | Admitting: Radiation Oncology

## 2015-07-09 DIAGNOSIS — C3492 Malignant neoplasm of unspecified part of left bronchus or lung: Secondary | ICD-10-CM | POA: Diagnosis not present

## 2015-07-09 DIAGNOSIS — C3412 Malignant neoplasm of upper lobe, left bronchus or lung: Secondary | ICD-10-CM

## 2015-07-11 NOTE — Progress Notes (Addendum)
   Department of Radiation Oncology  Phone:  [phone] Fax:        [phone]   Name: Undra Harriman MRN: [id number]  DOB: 1946-11-21  Date: 07/12/2015  Follow Up Visit Note  Diagnosis: T1 N0 left lung cancer  Summary and Interval since last radiation: SBRT to the left upper lobe a total of 60 gray in 5 fractions completed 11/16/2013  Interval History: Caitlin presents today for routine followup. His CT form earlier this week looks good with no signs of recurrence. They did not an enlarged aorta and follow up imaging was recommended. He follows with his PCP and Dr. Caryl Comes.  He has no new respiratory complaints.   Allergies:  Allergies  Allergen Reactions  . Ativan [Lorazepam] Anxiety and Other (See Comments)    Hyper  [name]    Medications:  Current Outpatient Prescriptions  Medication Sig Dispense Refill  . atorvastatin (LIPITOR) 80 MG tablet Take 1 tablet by mouth three times per week 30 tablet 1  . levETIRAcetam (KEPPRA) 500 MG tablet TAKE 1 TABLET (500 MG TOTAL) BY MOUTH 2 TIMES DAILY. 60 tablet 2  . Multiple Vitamin (MULTIVITAMIN) tablet Take 1 tablet by mouth daily.    [name] tiotropium (SPIRIVA HANDIHALER) 18 MCG inhalation capsule PLACE 1 CAPSULE (18 MCG TOTAL) INTO INHALER AND INHALE DAILY. 30 capsule 0  . warfarin (COUMADIN) 2 MG tablet TAKE '4MG'$  (=2 TABLETS) ON MONDAYS, WEDNESDAYS AND FRIDAYS AND '2MG'$  ALL OTHER DAYS 135 tablet 1   No current facility-administered medications for this encounter.    Physical Exam:  Filed Vitals:   07/12/15 1448  BP: 119/79  Pulse: 92  Temp: 97.8 F (36.6 C)  Resp: 18  Weight: 211 lb 14.4 oz (96.117 kg)  SpO2: 94%   alert oriented in no distress sitting comfortably on examining room chair  IMPRESSION: Keyshon is a 69 y.o. male status post SBRT for left upper lobe cancer, NED with new enlarged aorta.   PLAN:  I will check with Dr. Servando Snare in regards to monitoring this aorta and will  see him back in 6 months with a CT at that time for his lung cancer.  ###I spoke with Dr. Servando Snare who recommended a CT of the chest in 1 year with contrast to evaluate the aorta.  He requested I forward this to him and he would look at it. SW   This document serves as a record of services personally performed by [name], MD. It was created on her behalf by Darcus Austin, a trained medical scribe. The creation of this record is based on the scribe's personal observations and the provider's statements to them. This document has been checked and approved by the attending provider.   [name], MD

## 2015-07-12 ENCOUNTER — Telehealth: Payer: Self-pay | Admitting: *Deleted

## 2015-07-12 ENCOUNTER — Ambulatory Visit
Admission: RE | Admit: 2015-07-12 | Discharge: 2015-07-12 | Disposition: A | Discharge status: Home / Self Care | Payer: BLUE CROSS/BLUE SHIELD | Source: Ambulatory Visit | Attending: Radiation Oncology | Admitting: Radiation Oncology

## 2015-07-12 VITALS — BP 119/79 | HR 92 | Temp 97.8°F | Resp 18 | Wt 211.9 lb

## 2015-07-12 DIAGNOSIS — R911 Solitary pulmonary nodule: Secondary | ICD-10-CM

## 2015-07-12 NOTE — Progress Notes (Signed)
Routine follow up completion of radiation to left upper lobe lung.completed SBRT 60 Gy in 5 fractions on 11/16/2013.Denies pain, shortness of breath or cough.lCt of chest 07/09/15 reveals no evidence of progressive disease. BP 119/79 mmHg  Pulse 92  Temp(Src) 97.8 F (36.6 C)  Resp 18  Wt 211 lb 14.4 oz (96.117 kg)  SpO2 94%

## 2015-07-12 NOTE — Telephone Encounter (Signed)
CALLED PATIENT TO INFORM OF SCAN AND FU VISIT, LVM FOR A RETURN CALL 

## 2015-07-13 NOTE — Addendum Note (Signed)
Encounter addended by: [name], MD on: 07/13/2015  7:55 AM<BR>     Documentation filed: Clinical Notes, Notes Section

## 2015-07-18 ENCOUNTER — Ambulatory Visit (INDEPENDENT_AMBULATORY_CARE_PROVIDER_SITE_OTHER): Payer: BLUE CROSS/BLUE SHIELD | Admitting: Pharmacist

## 2015-07-18 DIAGNOSIS — I82409 Acute embolism and thrombosis of unspecified deep veins of unspecified lower extremity: Secondary | ICD-10-CM

## 2015-07-18 LAB — POCT INR: INR: 1.9

## 2015-07-18 NOTE — Patient Instructions (Signed)
Anticoagulation Dose Instructions as of 07/18/2015      Dorene Grebe Tue Wed Thu Fri Sat   New Dose 2 mg 4 mg 2 mg 4 mg 2 mg 4 mg 2 mg    Description        Take 2 and 1/2 tablets today - Wednesday, July 20th.  Then continue warfarin to '4mg'$  ( = 2 tablets) on Mondays, Wednesdays and Fridays and '2mg'$  (1 tablet) all others.     INR was 1.9 today

## 2015-07-19 ENCOUNTER — Ambulatory Visit (INDEPENDENT_AMBULATORY_CARE_PROVIDER_SITE_OTHER): Payer: BLUE CROSS/BLUE SHIELD | Admitting: *Deleted

## 2015-07-19 ENCOUNTER — Encounter: Payer: Self-pay | Admitting: Internal Medicine

## 2015-07-19 DIAGNOSIS — I255 Ischemic cardiomyopathy: Secondary | ICD-10-CM | POA: Diagnosis not present

## 2015-07-19 DIAGNOSIS — I5022 Chronic systolic (congestive) heart failure: Secondary | ICD-10-CM | POA: Diagnosis not present

## 2015-07-19 NOTE — Progress Notes (Signed)
Remote ICD transmission.   

## 2015-08-04 LAB — CUP PACEART REMOTE DEVICE CHECK
Battery Remaining Longevity: 77 mo
Battery Remaining Percentage: 76 %
Battery Voltage: 2.98 V
Brady Statistic RV Percent Paced: 1 %
Date Time Interrogation Session: 20160721060018
HighPow Impedance: 91 Ohm
HighPow Impedance: 91 Ohm
Lead Channel Impedance Value: 460 Ohm
Lead Channel Pacing Threshold Amplitude: 0.75 V
Lead Channel Pacing Threshold Pulse Width: 0.5 ms
Lead Channel Sensing Intrinsic Amplitude: 11.8 mV
Lead Channel Setting Pacing Amplitude: 2.5 V
Lead Channel Setting Pacing Pulse Width: 0.5 ms
Lead Channel Setting Sensing Sensitivity: 0.5 mV
Pulse Gen Serial Number: 7040910
Zone Setting Detection Interval: 250 ms
Zone Setting Detection Interval: 300 ms

## 2015-08-15 ENCOUNTER — Encounter: Payer: Self-pay | Admitting: Cardiology

## 2015-08-22 ENCOUNTER — Ambulatory Visit (INDEPENDENT_AMBULATORY_CARE_PROVIDER_SITE_OTHER): Payer: BLUE CROSS/BLUE SHIELD | Admitting: Pharmacist

## 2015-08-22 DIAGNOSIS — I82409 Acute embolism and thrombosis of unspecified deep veins of unspecified lower extremity: Secondary | ICD-10-CM | POA: Diagnosis not present

## 2015-08-22 LAB — POCT INR: INR: 1.9

## 2015-08-22 NOTE — Patient Instructions (Signed)
Anticoagulation Dose Instructions as of 08/22/2015      Travis Cruz Tue Wed Thu Fri Sat   New Dose 4 mg 2 mg 4 mg 2 mg 4 mg 2 mg 4 mg    Description        Change dose to warfarin '2mg'$  tablets - take 1 tablet on mondays, wednesdays and fridays and 2 tablets all other days.     INR was 1.9 today

## 2015-08-23 ENCOUNTER — Ambulatory Visit (INDEPENDENT_AMBULATORY_CARE_PROVIDER_SITE_OTHER): Payer: BLUE CROSS/BLUE SHIELD

## 2015-08-23 ENCOUNTER — Ambulatory Visit (INDEPENDENT_AMBULATORY_CARE_PROVIDER_SITE_OTHER): Payer: BLUE CROSS/BLUE SHIELD | Admitting: Family Medicine

## 2015-08-23 VITALS — BP 119/84 | HR 107 | Temp 97.0°F | Ht 69.0 in | Wt 210.0 lb

## 2015-08-23 DIAGNOSIS — M25551 Pain in right hip: Secondary | ICD-10-CM

## 2015-08-23 DIAGNOSIS — I255 Ischemic cardiomyopathy: Secondary | ICD-10-CM

## 2015-08-23 MED ORDER — BETAMETHASONE SOD PHOS & ACET 6 (3-3) MG/ML IJ SUSP
6.0000 mg | Freq: Once | INTRAMUSCULAR | Status: AC
  Administered 2015-08-23: 6 mg via INTRAMUSCULAR
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
## 2015-08-28 ENCOUNTER — Other Ambulatory Visit: Payer: Self-pay | Admitting: *Deleted

## 2015-08-28 MED ORDER — TIOTROPIUM BROMIDE MONOHYDRATE 18 MCG IN CAPS: ORAL_CAPSULE | RESPIRATORY_TRACT | Status: DC

## 2015-09-19 ENCOUNTER — Ambulatory Visit (INDEPENDENT_AMBULATORY_CARE_PROVIDER_SITE_OTHER): Payer: BLUE CROSS/BLUE SHIELD | Admitting: Family Medicine

## 2015-09-19 ENCOUNTER — Encounter: Payer: Self-pay | Admitting: Family Medicine

## 2015-09-19 ENCOUNTER — Ambulatory Visit (INDEPENDENT_AMBULATORY_CARE_PROVIDER_SITE_OTHER): Payer: BLUE CROSS/BLUE SHIELD

## 2015-09-19 VITALS — BP 137/98 | HR 91 | Temp 97.0°F | Ht 69.0 in | Wt 211.0 lb

## 2015-09-19 DIAGNOSIS — M25562 Pain in left knee: Secondary | ICD-10-CM

## 2015-09-19 DIAGNOSIS — L03116 Cellulitis of left lower limb: Secondary | ICD-10-CM | POA: Diagnosis not present

## 2015-09-19 DIAGNOSIS — I255 Ischemic cardiomyopathy: Secondary | ICD-10-CM

## 2015-09-19 MED ORDER — DOXYCYCLINE HYCLATE 100 MG PO TABS: 100.0000 mg | ORAL_TABLET | Freq: Two times a day (BID) | ORAL | Status: DC

## 2015-09-19 NOTE — Progress Notes (Signed)
   Subjective:    Patient ID: Travis Cruz, male    DOB: 17-Jun-1946, 69 y.o.   MRN: [id number]  HPI 69 year old gentleman who presents with area of pain and swelling behind his left knee. There is been no injury and no drainage but it has gotten progressively larger. He has had no fever or chills. Wife is concerned that some of his hardware in the leg might be trying to work its way out. Ongoing issues are seizure disorder COPD hyperlipidemia.  Patient Active Problem List   Diagnosis Date Noted  . COPD (chronic obstructive pulmonary disease)   . Implantable cardioverter-defibrillator single chamber [facility] 10/05/2013  . Cancer of upper lobe of lung 09/16/2013  . Abnormal chest x-ray 05/13/2013  . COPD exacerbation 05/13/2013  . HLD (hyperlipidemia) 05/13/2013  . CAD (coronary atherosclerotic disease) 05/13/2013  . Solitary pulmonary nodule 03/30/2013  . Transient ischemic attack (TIA), and cerebral infarction without residual deficits 03/28/2013  . H/O ventricular fibrillation 03/28/2013  . Ischemic cardiomyopathy 03/15/2013  . NSTEMI (non-ST elevated myocardial infarction) 01/27/2013  . Obstructive sleep apnea 03/10/2012  . Patent arterial duct 03/10/2012  . HTN (hypertension) 03/24/2011  . Deep vein thrombosis (DVT) 09/23/2008  . PE (pulmonary embolism) 09/23/2008   Outpatient Encounter Prescriptions as of 09/19/2015  Medication Sig  . atorvastatin (LIPITOR) 80 MG tablet Take 1 tablet by mouth three times per week  . levETIRAcetam (KEPPRA) 500 MG tablet TAKE 1 TABLET (500 MG TOTAL) BY MOUTH 2 TIMES DAILY.  . Multiple Vitamin (MULTIVITAMIN) tablet Take 1 tablet by mouth daily.  [name] tiotropium (SPIRIVA HANDIHALER) 18 MCG inhalation capsule PLACE 1 CAPSULE (18 MCG TOTAL) INTO INHALER AND INHALE DAILY.  [name] warfarin (COUMADIN) 2 MG tablet TAKE '4MG'$  (=2 TABLETS) ON MONDAYS, WEDNESDAYS AND FRIDAYS AND '2MG'$  ALL OTHER DAYS   No facility-administered encounter medications on file as of  09/19/2015.      Review of Systems  Skin: Positive for wound.       Objective:   Physical Exam  Constitutional: He appears well-developed and well-nourished.  Cardiovascular: Normal rate.   Pulmonary/Chest: Effort normal and breath sounds normal.  Skin:  Left leg: Popliteal fossa soft tissue swelling with surrounding erythema. Not fluctuant. Overlying skin in the center of the wound is sloughing probably related to pressure. X-ray shows no evidence of hardware in the area.    BP 137/98 mmHg  Pulse 91  Temp(Src) 97 F (36.1 C) (Oral)  Ht '5\' 9"'$  (1.753 m)  Wt 211 lb (95.709 kg)  BMI 31.15 kg/m2         Assessment & Plan:  1. Knee pain, acute, left No injury but there is infection - DG Knee 1-2 Views Left; Future  2. Cellulitis of left lower extremity I suspect this may be an infected Baker's cyst but it may be more superficial. Since it's not fluctuant and ready to I&D I will begin doxycycline 100 mg twice a day and warm compresses 4 times a day. I   Wardell Honour MDstressed importance of the compresses. I would like to him to return in 48 hours for recheck at which time it would be good to check an INR since he is on the Coumadin.

## 2015-09-21 ENCOUNTER — Ambulatory Visit (INDEPENDENT_AMBULATORY_CARE_PROVIDER_SITE_OTHER): Payer: BLUE CROSS/BLUE SHIELD | Admitting: Family Medicine

## 2015-09-21 ENCOUNTER — Encounter: Payer: Self-pay | Admitting: Family Medicine

## 2015-09-21 VITALS — BP 115/82 | HR 103 | Temp 98.3°F | Ht 69.0 in | Wt 204.0 lb

## 2015-09-21 DIAGNOSIS — I82409 Acute embolism and thrombosis of unspecified deep veins of unspecified lower extremity: Secondary | ICD-10-CM | POA: Diagnosis not present

## 2015-09-21 DIAGNOSIS — I255 Ischemic cardiomyopathy: Secondary | ICD-10-CM

## 2015-09-21 DIAGNOSIS — L0291 Cutaneous abscess, unspecified: Secondary | ICD-10-CM | POA: Diagnosis not present

## 2015-09-21 DIAGNOSIS — L0390 Cellulitis, unspecified: Secondary | ICD-10-CM | POA: Diagnosis not present

## 2015-09-21 LAB — POCT INR: INR: 2.8

## 2015-09-21 NOTE — Progress Notes (Signed)
   HPI  Patient presents today for follow-up for popliteal cellulitis with abscess  Patient was seen 2 days ago in this clinic and started on doxycycline for left popliteal cellulitis and likely abscess. He is tolerating medications easily He denies fever, chills, sweats. He states that overall he feels good and feels like it's improving. The area of redness has improved and it has begun draining some thick yellow bloody discharge.  He has also gotten an INR check today because she is on Coumadin for pulmonary embolism.  PMH: Smoking status noted ROS: Per HPI  Objective: BP 115/82 mmHg  Pulse 103  Temp(Src) 98.3 F (36.8 C) (Oral)  Ht '5\' 9"'$  (1.753 m)  Wt 204 lb (92.534 kg)  BMI 30.11 kg/m2 Gen: NAD, alert, cooperative with exam HEENT: NCAT CV: RRR, good S1/S2, no murmur Resp: CTABL, no wheezes, non-labored Ext: No edema, warm Neuro: Alert and oriented, No gross deficits Skin: 2.5 cm x 4 cm area of erythema in the left popliteal fossa, moderate amount of thick yellow bloody drainage with squeezing produced, area is warm to the touch and slightly tender to palpation, area of induration is approximately the same size as the area of erythema  Assessment and plan:  # Popliteal cellulitis and abscess Continue doxycycline, improving Encouraged that the draining is good, continue warm compresses and expression of drainage 3-4 times daily Discussed red flags for return and reasons to seek urgent medical care Discussed possibility of incision and drainage if it stops draining or worsens, however this would be increased risk with concomitant Coumadin treatment.   Laroy Apple, MD [facility] 09/21/2015, 8:22 AM

## 2015-09-27 ENCOUNTER — Ambulatory Visit (INDEPENDENT_AMBULATORY_CARE_PROVIDER_SITE_OTHER): Payer: BLUE CROSS/BLUE SHIELD | Admitting: Family Medicine

## 2015-09-27 ENCOUNTER — Encounter: Payer: Self-pay | Admitting: Family Medicine

## 2015-09-27 ENCOUNTER — Other Ambulatory Visit: Payer: Self-pay

## 2015-09-27 VITALS — BP 119/83 | HR 91 | Temp 97.8°F | Ht 69.0 in | Wt 204.8 lb

## 2015-09-27 DIAGNOSIS — I255 Ischemic cardiomyopathy: Secondary | ICD-10-CM

## 2015-09-27 DIAGNOSIS — L0390 Cellulitis, unspecified: Secondary | ICD-10-CM

## 2015-09-27 DIAGNOSIS — L0291 Cutaneous abscess, unspecified: Secondary | ICD-10-CM | POA: Insufficient documentation

## 2015-09-27 MED ORDER — CLINDAMYCIN HCL 300 MG PO CAPS: 300.0000 mg | ORAL_CAPSULE | Freq: Three times a day (TID) | ORAL | Status: DC

## 2015-09-27 NOTE — Progress Notes (Signed)
   HPI  Patient presents today here for follow-up of popliteal cellulitis  Patient and his wife explained that he is finished his doxycycline. The redness of the area seems to be improved but he continues to have clear yellow to bloody drainage and the size alone does not seem to be any improved.  He denies fever, chills, sweats, loss of appetite, nausea, or dyspnea. He tolerated the robotics easily.  PMH: Smoking status noted ROS: Per HPI  Objective: BP 119/83 mmHg  Pulse 91  Temp(Src) 97.8 F (36.6 C) (Oral)  Ht '5\' 9"'$  (1.753 m)  Wt 204 lb 12.8 oz (92.897 kg)  BMI 30.23 kg/m2 Gen: NAD, alert, cooperative with exam HEENT: NCAT CV: RRR, good S1/S2, no murmur Resp: CTABL, no wheezes, non-labored Ext: Scarring of left lower extremity consistent with previous trauma Skin: Left popliteal fossa with approximately 3 cm x 1.5 cm area of slight erythema and induration, area centrally located that is approximately 1 cm x 2 cm with yellow granulation tissue and thin yellow drainage expressed with palpation. Using a cotton swab the area of yellow granulation was removed and the area was explored revealing approximately 1 cm deep area with some bloody discharge expressed.   Assessment and plan:  # Cellulitis Wound/abscess culture He has completed doxycycline, continued symptoms so will use clindamycin Investigate for deep abscess with ultrasound, possible referral to surgery for drainage given that he is on Coumadin and its located in the popliteal fossa. Reviewed red flags for return  Orders Placed This Encounter  Procedures  . Anaerobic and Aerobic Culture  . [place] Misc Soft Tissue    Standing Status: Future     Number of Occurrences:      Standing Expiration Date: 11/26/2016    Order Specific Question:  Reason for Exam (SYMPTOM  OR DIAGNOSIS REQUIRED)    Answer:  R knee cellulitis, question of if there is deep abcess    Order Specific Question:  Preferred imaging location?   Answer:  [facility]    Meds ordered this encounter  Medications  . clindamycin (CLEOCIN) 300 MG capsule    Sig: Take 1 capsule (300 mg total) by mouth 3 (three) times daily.    Dispense:  30 capsule    Refill:  Prosser, MD [facility] 09/27/2015, 2:54 PM

## 2015-09-27 NOTE — Patient Instructions (Signed)
Great to see you again!  I have sent another antibiotic, clindamycin  We will be watching for the ultrasound results  Cellulitis Cellulitis is an infection of the skin and the tissue beneath it. The infected area is usually red and tender. Cellulitis occurs most often in the arms and lower legs.  CAUSES  Cellulitis is caused by bacteria that enter the skin through cracks or cuts in the skin. The most common types of bacteria that cause cellulitis are staphylococci and streptococci. SIGNS AND SYMPTOMS   Redness and warmth.  Swelling.  Tenderness or pain.  Fever. DIAGNOSIS  Your health care provider can usually determine what is wrong based on a physical exam. Blood tests may also be done. TREATMENT  Treatment usually involves taking an antibiotic medicine. HOME CARE INSTRUCTIONS   Take your antibiotic medicine as directed by your health care provider. Finish the antibiotic even if you start to feel better.  Keep the infected arm or leg elevated to reduce swelling.  Apply a warm cloth to the affected area up to 4 times per day to relieve pain.  Take medicines only as directed by your health care provider.  Keep all follow-up visits as directed by your health care provider. SEEK MEDICAL CARE IF:   You notice red streaks coming from the infected area.  Your red area gets larger or turns dark in color.  Your bone or joint underneath the infected area becomes painful after the skin has healed.  Your infection returns in the same area or another area.  You notice a swollen bump in the infected area.  You develop new symptoms.  You have a fever. SEEK IMMEDIATE MEDICAL CARE IF:   You feel very sleepy.  You develop vomiting or diarrhea.  You have a general ill feeling (malaise) with muscle aches and pains. MAKE SURE YOU:   Understand these instructions.  Will watch your condition.  Will get help right away if you are not doing well or get worse. Document Released:  09/24/2005 Document Revised: 05/01/2014 Document Reviewed: 03/01/2012 [facility] Patient Information 2015 [place], [place]. This information is not intended to replace advice given to you by your health care provider. Make sure you discuss any questions you have with your health care provider.

## 2015-09-28 ENCOUNTER — Other Ambulatory Visit: Payer: Self-pay | Admitting: Family Medicine

## 2015-09-28 ENCOUNTER — Ambulatory Visit (HOSPITAL_COMMUNITY): Payer: BLUE CROSS/BLUE SHIELD

## 2015-09-28 ENCOUNTER — Ambulatory Visit (HOSPITAL_COMMUNITY)
Admission: RE | Admit: 2015-09-28 | Discharge: 2015-09-28 | Disposition: A | Discharge status: Home / Self Care | Payer: BLUE CROSS/BLUE SHIELD | Source: Ambulatory Visit | Attending: Family Medicine | Admitting: Family Medicine

## 2015-09-28 DIAGNOSIS — L03116 Cellulitis of left lower limb: Secondary | ICD-10-CM | POA: Insufficient documentation

## 2015-09-28 DIAGNOSIS — L0291 Cutaneous abscess, unspecified: Secondary | ICD-10-CM

## 2015-09-28 DIAGNOSIS — L0390 Cellulitis, unspecified: Secondary | ICD-10-CM

## 2015-10-01 LAB — ANAEROBIC AND AEROBIC CULTURE

## 2015-10-03 ENCOUNTER — Encounter: Payer: Self-pay | Admitting: Family Medicine

## 2015-10-03 ENCOUNTER — Ambulatory Visit (INDEPENDENT_AMBULATORY_CARE_PROVIDER_SITE_OTHER): Payer: BLUE CROSS/BLUE SHIELD | Admitting: Family Medicine

## 2015-10-03 ENCOUNTER — Encounter: Payer: Self-pay | Admitting: Pharmacist

## 2015-10-03 VITALS — BP 114/76 | HR 98 | Temp 97.5°F | Ht 69.0 in | Wt 205.0 lb

## 2015-10-03 DIAGNOSIS — I82409 Acute embolism and thrombosis of unspecified deep veins of unspecified lower extremity: Secondary | ICD-10-CM | POA: Diagnosis not present

## 2015-10-03 DIAGNOSIS — I255 Ischemic cardiomyopathy: Secondary | ICD-10-CM

## 2015-10-03 DIAGNOSIS — L0390 Cellulitis, unspecified: Secondary | ICD-10-CM | POA: Diagnosis not present

## 2015-10-03 DIAGNOSIS — L0291 Cutaneous abscess, unspecified: Secondary | ICD-10-CM | POA: Diagnosis not present

## 2015-10-03 LAB — POCT INR: INR: 3.3

## 2015-10-03 NOTE — Progress Notes (Signed)
   Subjective:    Patient ID: Travis Cruz, male    DOB: [birth year], 69 y.o.   MRN: [id number]  HPI 69 year old gentleman here to follow-up popliteal abscess. Since I saw him last he has had surgical consultation as well as ultrasound. The surgeon elected not to do any more drainage. He told patient that it should fill it finished draining and healing  by itself which I would agree with. He continues to take clindamycin for a 10 day course.  Patient Active Problem List   Diagnosis Date Noted  . Cellulitis and abscess 09/27/2015  . COPD (chronic obstructive pulmonary disease) ([place])   . Implantable cardioverter-defibrillator single chamber [facility] 10/05/2013  . Cancer of upper lobe of lung ([place]) 09/16/2013  . Abnormal chest x-ray 05/13/2013  . COPD exacerbation ([place]) 05/13/2013  . HLD (hyperlipidemia) 05/13/2013  . CAD (coronary atherosclerotic disease) 05/13/2013  . Solitary pulmonary nodule 03/30/2013  . Transient ischemic attack (TIA), and cerebral infarction without residual deficits 03/28/2013  . H/O ventricular fibrillation 03/28/2013  . Ischemic cardiomyopathy 03/15/2013  . NSTEMI (non-ST elevated myocardial infarction) ([place]) 01/27/2013  . Obstructive sleep apnea 03/10/2012  . Patent arterial duct 03/10/2012  . HTN (hypertension) 03/24/2011  . Deep vein thrombosis (DVT) ([place]) 09/23/2008  . PE (pulmonary embolism) 09/23/2008   Outpatient Encounter Prescriptions as of 10/03/2015  Medication Sig  . atorvastatin (LIPITOR) 80 MG tablet Take 1 tablet by mouth three times per week  . clindamycin (CLEOCIN) 300 MG capsule Take 1 capsule (300 mg total) by mouth 3 (three) times daily.  [name] levETIRAcetam (KEPPRA) 500 MG tablet TAKE 1 TABLET (500 MG TOTAL) BY MOUTH 2 TIMES DAILY.  . Multiple Vitamin (MULTIVITAMIN) tablet Take 1 tablet by mouth daily.  [name] tiotropium (SPIRIVA HANDIHALER) 18 MCG inhalation capsule PLACE 1 CAPSULE (18 MCG TOTAL) INTO INHALER AND INHALE DAILY.  [name] warfarin  (COUMADIN) 2 MG tablet TAKE '4MG'$  (=2 TABLETS) ON MONDAYS, WEDNESDAYS AND FRIDAYS AND '2MG'$  ALL OTHER DAYS   No facility-administered encounter medications on file as of 10/03/2015.      Review of Systems  Constitutional: Negative.   Skin: Negative.        Objective:   Physical Exam  Skin:  abscess is still opened. I cannot really palpate any fluctuance and there is minimal drainage on the dressing as we removed. There is no pain. There is no surrounding inflammation or erythema. He has had no systemic symptoms such as fever.  Vitals reviewed.         Assessment & Plan:   1. DVT (deep venous thrombosis), unspecified laterality  - POCT INR  2. Cellulitis and abscess Abscess has improved. Wound is still open with minimal drainage. This should resolve without any further intervention. I explained to the wife that it would heal from bottom up. I think this is hard for most folks to comprehend but it certainly does not need to be closed. He is to finish current prescription for clindamycin.  Wardell Honour MD

## 2015-10-03 NOTE — Patient Instructions (Signed)
Anticoagulation Dose Instructions as of 10/03/2015      Dorene Grebe Tue Wed Thu Fri Sat   New Dose 4 mg 2 mg 4 mg 2 mg 4 mg 2 mg 2 mg    Description        No warfarin today - Wednesday, October 5th and decrease back to '4mg'$  sundays, tuesdays and thursdays and '2mg'$  all other days.     INR was 3.3 today

## 2015-10-17 ENCOUNTER — Ambulatory Visit (INDEPENDENT_AMBULATORY_CARE_PROVIDER_SITE_OTHER): Payer: BLUE CROSS/BLUE SHIELD | Admitting: Pharmacist

## 2015-10-17 DIAGNOSIS — I2699 Other pulmonary embolism without acute cor pulmonale: Secondary | ICD-10-CM | POA: Diagnosis not present

## 2015-10-17 DIAGNOSIS — I824Z9 Acute embolism and thrombosis of unspecified deep veins of unspecified distal lower extremity: Secondary | ICD-10-CM | POA: Diagnosis not present

## 2015-10-17 LAB — POCT INR: INR: 2.1

## 2015-10-17 NOTE — Patient Instructions (Signed)
Anticoagulation Dose Instructions as of 10/17/2015      Travis Cruz Tue Wed Thu Fri Sat   New Dose 4 mg 2 mg 4 mg 2 mg 4 mg 2 mg 2 mg    Description        Continue current warfarin '2mg'$  tablet - take 2 tablets = '4mg'$  on sundays, tuesdays and thursdays and 1 tablet = '2mg'$  all other days.     INR was 2.1 today

## 2015-10-22 ENCOUNTER — Ambulatory Visit (INDEPENDENT_AMBULATORY_CARE_PROVIDER_SITE_OTHER): Payer: BLUE CROSS/BLUE SHIELD | Admitting: *Deleted

## 2015-10-22 DIAGNOSIS — I469 Cardiac arrest, cause unspecified: Secondary | ICD-10-CM

## 2015-10-23 NOTE — Progress Notes (Signed)
Remote ICD transmission.   

## 2015-10-24 ENCOUNTER — Ambulatory Visit (INDEPENDENT_AMBULATORY_CARE_PROVIDER_SITE_OTHER): Payer: BLUE CROSS/BLUE SHIELD | Admitting: Family Medicine

## 2015-10-24 ENCOUNTER — Encounter: Payer: Self-pay | Admitting: Family Medicine

## 2015-10-24 VITALS — BP 128/84 | HR 84 | Temp 97.6°F | Ht 69.0 in | Wt 210.0 lb

## 2015-10-24 DIAGNOSIS — E785 Hyperlipidemia, unspecified: Secondary | ICD-10-CM | POA: Diagnosis not present

## 2015-10-24 DIAGNOSIS — I10 Essential (primary) hypertension: Secondary | ICD-10-CM

## 2015-10-24 DIAGNOSIS — L03116 Cellulitis of left lower limb: Secondary | ICD-10-CM

## 2015-10-24 DIAGNOSIS — I255 Ischemic cardiomyopathy: Secondary | ICD-10-CM

## 2015-10-24 NOTE — Progress Notes (Signed)
   Subjective:    Patient ID: Travis Cruz, male    DOB: January 19, 1946, 69 y.o.   MRN: [id number]  HPI 69 year old gentleman here to follow-up hypertension and COPD and hyperlipidemia. There is still concern about abscess behind his knee. It is still draining some serosanguineous fluid and there is concerned that it has not closed yet. He has no other complaints. Breathing is doing well with only Spiriva. There is no chest pain.  Patient Active Problem List   Diagnosis Date Noted  . Cellulitis and abscess 09/27/2015  . COPD (chronic obstructive pulmonary disease) ([place])   . Implantable cardioverter-defibrillator single chamber [employer] Judes 10/05/2013  . Cancer of upper lobe of lung ([place]) 09/16/2013  . Abnormal chest x-ray 05/13/2013  . COPD exacerbation ([place]) 05/13/2013  . HLD (hyperlipidemia) 05/13/2013  . CAD (coronary atherosclerotic disease) 05/13/2013  . Solitary pulmonary nodule 03/30/2013  . Transient ischemic attack (TIA), and cerebral infarction without residual deficits 03/28/2013  . H/O ventricular fibrillation 03/28/2013  . Ischemic cardiomyopathy 03/15/2013  . NSTEMI (non-ST elevated myocardial infarction) ([place]) 01/27/2013  . Obstructive sleep apnea 03/10/2012  . Patent arterial duct 03/10/2012  . HTN (hypertension) 03/24/2011  . Deep vein thrombosis (DVT) ([place]) 09/23/2008  . PE (pulmonary embolism) 09/23/2008   Outpatient Encounter Prescriptions as of 10/24/2015  Medication Sig  . atorvastatin (LIPITOR) 80 MG tablet Take 1 tablet by mouth three times per week  . levETIRAcetam (KEPPRA) 500 MG tablet TAKE 1 TABLET (500 MG TOTAL) BY MOUTH 2 TIMES DAILY.  . Multiple Vitamin (MULTIVITAMIN) tablet Take 1 tablet by mouth daily.  [name] tiotropium (SPIRIVA HANDIHALER) 18 MCG inhalation capsule PLACE 1 CAPSULE (18 MCG TOTAL) INTO INHALER AND INHALE DAILY.  [name] warfarin (COUMADIN) 2 MG tablet TAKE '4MG'$  (=2 TABLETS) ON MONDAYS, WEDNESDAYS AND FRIDAYS AND '2MG'$  ALL OTHER DAYS   No  facility-administered encounter medications on file as of 10/24/2015.      Review of Systems  Constitutional: Negative.   Respiratory: Negative.   Cardiovascular: Negative.   Neurological: Negative.   Psychiatric/Behavioral: Negative.        Objective:   Physical Exam  Constitutional: He is oriented to person, place, and time. He appears well-developed and well-nourished.  Cardiovascular: Normal rate and regular rhythm.   Pulmonary/Chest: Effort normal and breath sounds normal.  Abdominal: Soft. Bowel sounds are normal.  Neurological: He is alert and oriented to person, place, and time.  Skin:  Abscess is nonfluctuant. There is no erythema there is a small amount of drainage on dressing which I removed. I think this is healing in and we'll just take some time and have explained this to patient and his wife.          Assessment & Plan:  1. Essential hypertension Blood pressure is well controlled. He is not really on medicine for his blood pressure.  2. Cellulitis of left lower extremity Infection has cleared and wound will heal in by secondary intention  3. HLD (hyperlipidemia) Since I changed his atorvastatin to every other day will check lipids today to be sure there is been no change - Lipid panel  Wardell Honour MD

## 2015-10-25 ENCOUNTER — Telehealth: Payer: Self-pay | Admitting: *Deleted

## 2015-10-25 LAB — LIPID PANEL
Chol/HDL Ratio: 3.4 ratio units (ref 0.0–5.0)
Cholesterol, Total: 147 mg/dL (ref 100–199)
HDL: 43 mg/dL (ref >39)
LDL Calculated: 82 mg/dL (ref 0–99)
Triglycerides: 110 mg/dL (ref 0–149)
VLDL Cholesterol Cal: 22 mg/dL (ref 5–40)

## 2015-10-25 NOTE — Telephone Encounter (Signed)
Attempted call to home # x 1---LMTCB  Spoke to wife on cell #---asked wife to have pt to call ofc.   Abnormal corvue.

## 2015-10-26 ENCOUNTER — Encounter: Payer: Self-pay | Admitting: Cardiology

## 2015-10-26 ENCOUNTER — Other Ambulatory Visit: Payer: Self-pay | Admitting: *Deleted

## 2015-10-26 LAB — CUP PACEART REMOTE DEVICE CHECK
Battery Remaining Longevity: 74 mo
Battery Remaining Percentage: 73 %
Battery Voltage: 2.98 V
Brady Statistic RV Percent Paced: 1 %
Date Time Interrogation Session: 20161024080014
HighPow Impedance: 88 Ohm
HighPow Impedance: 88 Ohm
Implantable Lead Implant Date: 20140205
Implantable Lead Location: 753860
Implantable Lead Model: 181
Implantable Lead Serial Number: 323859
Lead Channel Impedance Value: 460 Ohm
Lead Channel Pacing Threshold Amplitude: 0.75 V
Lead Channel Pacing Threshold Pulse Width: 0.5 ms
Lead Channel Sensing Intrinsic Amplitude: 11.8 mV
Lead Channel Setting Pacing Amplitude: 2.5 V
Lead Channel Setting Pacing Pulse Width: 0.5 ms
Lead Channel Setting Sensing Sensitivity: 0.5 mV
Pulse Gen Serial Number: 7040910

## 2015-10-26 MED ORDER — TIOTROPIUM BROMIDE MONOHYDRATE 18 MCG IN CAPS: ORAL_CAPSULE | RESPIRATORY_TRACT | Status: DC

## 2015-10-26 NOTE — Telephone Encounter (Signed)
Follow up     Returning a call to [facility]

## 2015-10-26 NOTE — Telephone Encounter (Signed)
Spoke to patient regarding abnormal corvue. Patient denies sx's of ShOB, CP, abdominal bloat, or LE edema. Will inform SK and notify patient if anything further is recommended. Patient voiced understanding.

## 2015-11-07 ENCOUNTER — Ambulatory Visit (INDEPENDENT_AMBULATORY_CARE_PROVIDER_SITE_OTHER): Payer: BLUE CROSS/BLUE SHIELD | Admitting: Pharmacist

## 2015-11-07 DIAGNOSIS — I82409 Acute embolism and thrombosis of unspecified deep veins of unspecified lower extremity: Secondary | ICD-10-CM

## 2015-11-07 LAB — POCT INR: INR: 2.4

## 2015-11-07 NOTE — Patient Instructions (Signed)
Anticoagulation Dose Instructions as of 11/07/2015      Dorene Grebe Tue Wed Thu Fri Sat   New Dose 4 mg 2 mg 4 mg 2 mg 4 mg 2 mg 2 mg    Description        Continue current warfarin '2mg'$  tablet - take 2 tablets = '4mg'$  on sundays, tuesdays and thursdays and 1 tablet = '2mg'$  all other days.

## 2015-11-21 ENCOUNTER — Other Ambulatory Visit: Payer: Self-pay

## 2015-11-21 MED ORDER — WARFARIN SODIUM 2 MG PO TABS: ORAL_TABLET | ORAL | Status: DC

## 2015-12-15 ENCOUNTER — Other Ambulatory Visit: Payer: Self-pay | Admitting: *Deleted

## 2015-12-15 MED ORDER — TIOTROPIUM BROMIDE MONOHYDRATE 18 MCG IN CAPS: ORAL_CAPSULE | RESPIRATORY_TRACT | Status: DC

## 2015-12-19 ENCOUNTER — Ambulatory Visit (INDEPENDENT_AMBULATORY_CARE_PROVIDER_SITE_OTHER): Payer: BLUE CROSS/BLUE SHIELD | Admitting: Pharmacist

## 2015-12-19 DIAGNOSIS — I824Z9 Acute embolism and thrombosis of unspecified deep veins of unspecified distal lower extremity: Secondary | ICD-10-CM

## 2015-12-19 LAB — POCT INR: INR: 1.9

## 2015-12-19 NOTE — Patient Instructions (Signed)
Anticoagulation Dose Instructions as of 12/19/2015      Dorene Grebe Tue Wed Thu Fri Sat   New Dose 4 mg 2 mg 4 mg 2 mg 4 mg 2 mg 2 mg    Description        Take '4mg'$  or 2 tablets instead of 1 tablet today - Wednesday, December 21st, then continue current warfarin dose of '2mg'$  tablest - take 2 tablets = '4mg'$  on sundays, tuesdays and thursdays and 1 tablet = '2mg'$  all other days.       INR was 1.9 today

## 2015-12-20 ENCOUNTER — Telehealth: Payer: Self-pay | Admitting: Pharmacist

## 2015-12-20 ENCOUNTER — Encounter (INDEPENDENT_AMBULATORY_CARE_PROVIDER_SITE_OTHER): Payer: BLUE CROSS/BLUE SHIELD | Admitting: Ophthalmology

## 2015-12-20 DIAGNOSIS — H53413 Scotoma involving central area, bilateral: Secondary | ICD-10-CM | POA: Diagnosis not present

## 2015-12-20 DIAGNOSIS — H43813 Vitreous degeneration, bilateral: Secondary | ICD-10-CM

## 2015-12-20 DIAGNOSIS — H353124 Nonexudative age-related macular degeneration, left eye, advanced atrophic with subfoveal involvement: Secondary | ICD-10-CM | POA: Diagnosis not present

## 2015-12-20 DIAGNOSIS — H353190 Nonexudative age-related macular degeneration, unspecified eye, stage unspecified: Secondary | ICD-10-CM | POA: Insufficient documentation

## 2015-12-20 DIAGNOSIS — H353121 Nonexudative age-related macular degeneration, left eye, early dry stage: Secondary | ICD-10-CM

## 2015-12-20 NOTE — Telephone Encounter (Signed)
Patient's wife left message about new diagnosis of macular degeneration and if OK to start recommended AREDS vitamin since patient is taking warfarin.  OK to start AREDS.

## 2016-01-14 ENCOUNTER — Other Ambulatory Visit: Payer: Self-pay | Admitting: Family Medicine

## 2016-01-15 ENCOUNTER — Ambulatory Visit (HOSPITAL_COMMUNITY)
Admission: RE | Admit: 2016-01-15 | Discharge: 2016-01-15 | Disposition: A | Discharge status: Home / Self Care | Payer: BLUE CROSS/BLUE SHIELD | Source: Ambulatory Visit | Attending: Radiation Oncology | Admitting: Radiation Oncology

## 2016-01-15 DIAGNOSIS — R911 Solitary pulmonary nodule: Secondary | ICD-10-CM | POA: Diagnosis not present

## 2016-01-15 DIAGNOSIS — J9811 Atelectasis: Secondary | ICD-10-CM | POA: Insufficient documentation

## 2016-01-15 DIAGNOSIS — Z950 Presence of cardiac pacemaker: Secondary | ICD-10-CM | POA: Diagnosis not present

## 2016-01-15 DIAGNOSIS — Z85118 Personal history of other malignant neoplasm of bronchus and lung: Secondary | ICD-10-CM | POA: Diagnosis not present

## 2016-01-15 DIAGNOSIS — K8080 Other cholelithiasis without obstruction: Secondary | ICD-10-CM | POA: Insufficient documentation

## 2016-01-15 DIAGNOSIS — J439 Emphysema, unspecified: Secondary | ICD-10-CM | POA: Insufficient documentation

## 2016-01-15 DIAGNOSIS — Z08 Encounter for follow-up examination after completed treatment for malignant neoplasm: Secondary | ICD-10-CM | POA: Diagnosis not present

## 2016-01-17 ENCOUNTER — Encounter: Payer: Self-pay | Admitting: Radiation Oncology

## 2016-01-17 ENCOUNTER — Ambulatory Visit
Admission: RE | Admit: 2016-01-17 | Discharge: 2016-01-17 | Disposition: A | Discharge status: Home / Self Care | Payer: BLUE CROSS/BLUE SHIELD | Source: Ambulatory Visit | Attending: Radiation Oncology | Admitting: Radiation Oncology

## 2016-01-17 ENCOUNTER — Encounter: Payer: Self-pay | Admitting: Adult Health

## 2016-01-17 VITALS — BP 114/73 | HR 102 | Temp 98.3°F | Ht 69.0 in | Wt 217.7 lb

## 2016-01-17 DIAGNOSIS — C3412 Malignant neoplasm of upper lobe, left bronchus or lung: Secondary | ICD-10-CM

## 2016-01-17 NOTE — Progress Notes (Signed)
   Department of Radiation Oncology  Phone:  [phone] Fax:        [phone]   Name: Travis Cruz MRN: [id number]  DOB: [birth year]  Date: 01/17/2016  Follow Up Visit Note  Diagnosis:  No diagnosis found.   Summary and Interval since last radiation: 2 years 2 months. Completed SBRT 11/16/2013 to the left upper lobe to a total dose of 60 Gy in 5 fractions.  Interval History: Travis Cruz presents today for routine follow up. He is feeling well. He had a CT of the chest in January which showed no evidence of disease recurrence and no enlarged lymph nodes. Unfortunately this was performed without contrast so we can't really look at his aorta. His creatinine was fine in April. He denies pain today. His appetite and energy level are good. He denies any shortness of breath, has an O2 saturation of 92%, and denies any swallowing problems.  Allergies:  Allergies  Allergen Reactions  . Ativan [Lorazepam] Anxiety and Other (See Comments)    Hyper  [name]    Medications:  Current Outpatient Prescriptions  Medication Sig Dispense Refill  . atorvastatin (LIPITOR) 80 MG tablet TAKE 1 TABLET BY MOUTH THREE TIMES PER WEEK 30 tablet 3  . levETIRAcetam (KEPPRA) 500 MG tablet TAKE 1 TABLET (500 MG TOTAL) BY MOUTH 2 TIMES DAILY. 60 tablet 2  . Multiple Vitamin (MULTIVITAMIN) tablet Take 1 tablet by mouth daily.    . Multiple Vitamins-Minerals (PRESERVISION AREDS 2) CAPS Take by mouth.    . tiotropium (SPIRIVA HANDIHALER) 18 MCG inhalation capsule PLACE 1 CAPSULE (18 MCG TOTAL) INTO INHALER AND INHALE DAILY. 30 capsule 3  . warfarin (COUMADIN) 2 MG tablet TAKE '4MG'$  (=2 TABLETS) ON MONDAYS, WEDNESDAYS AND FRIDAYS AND '2MG'$  ALL OTHER DAYS 135 tablet 1   No current facility-administered medications for this encounter.    Physical Exam:  Filed Vitals:   01/17/16 1259  BP: 114/73  Pulse: 102  Temp: 98.3 F (36.8 C)  TempSrc: Oral  Height: '5\' 9"'$  (1.753  m)  Weight: 217 lb 11.2 oz (98.748 kg)  SpO2: 92%  Pleasant male in no distress sitting comfortably examining table. Alert and oriented x3.  IMPRESSION: Travis Cruz is a 70 y.o. male status post SBRT, currently NED.   PLAN: I will forward this non-contrast scan to Dr. Servando Snare for review. We can order a contrast scan if needed. He is now 2 years out from his treatment so we can now go to annual CT's, which I have scheduled in a year. I have released him from following up with me and will have him follow up with survivorship in a year.  ------------------------------------------------  [name], MD    This document serves as a record of services personally performed by [name], MD. It was created on her behalf by  Lendon Collar, a trained medical scribe. The creation of this record is based on the scribe's personal observations and the provider's statements to them. This document has been checked and approved by the attending provider.

## 2016-01-17 NOTE — Progress Notes (Signed)
Travis Cruz is here today for a F/U visit to receive results of CT scan of lung from 01-15-16.  Reports having no pain today.  Appetite is good.  Energy level is good.   Skin normal pink color.  Denies any SOB O2 sat 92 % and no swallowing problems. Wt Readings from Last 3 Encounters:  01/17/16 217 lb 11.2 oz (98.748 kg)  10/24/15 210 lb (95.255 kg)  10/03/15 205 lb (92.987 kg)   BP 114/73 mmHg  Pulse 102  Temp(Src) 98.3 F (36.8 C) (Oral)  Ht '5\' 9"'$  (1.753 m)  Wt 217 lb 11.2 oz (98.748 kg)  BMI 32.13 kg/m2  SpO2 92%

## 2016-01-17 NOTE — Progress Notes (Signed)
I briefly met with [name] and his wife today during their routine follow-up visit with Dr. Pablo Ledger.  [name] completed treatment for Stage I left lung cancer with SBRT in 10/2013.  He will receive his continued surveillance with me in the Survivorship Clinic in 1 year with CT chest with contrast.    Per his request, after the CT is scheduled, then we will schedule him to see me 1 week after the scan given they have to drive to [place] from [place].  I gave them a copy of the "Life After Cancer for Every Survivor" booklet, as well as my business card. I encouraged them to call me with any questions or concerns before their next visit here.  I will contact them with their appt to see me after the CT is scheduled.  I look forward to participating in this patient's care!  Mike Craze, NP [place] [phone]

## 2016-01-18 ENCOUNTER — Telehealth: Payer: Self-pay | Admitting: *Deleted

## 2016-01-18 NOTE — Telephone Encounter (Signed)
CALLED PATIENT TO INFORM OF LAB AND SCAN, LVM FOR A RETURN CALL

## 2016-01-21 ENCOUNTER — Encounter: Payer: Self-pay | Admitting: Adult Health

## 2016-01-21 ENCOUNTER — Other Ambulatory Visit: Payer: Self-pay | Admitting: Adult Health

## 2016-01-21 ENCOUNTER — Ambulatory Visit (INDEPENDENT_AMBULATORY_CARE_PROVIDER_SITE_OTHER): Payer: BLUE CROSS/BLUE SHIELD | Admitting: *Deleted

## 2016-01-21 ENCOUNTER — Telehealth: Payer: Self-pay | Admitting: Internal Medicine

## 2016-01-21 DIAGNOSIS — I469 Cardiac arrest, cause unspecified: Secondary | ICD-10-CM | POA: Diagnosis not present

## 2016-01-21 DIAGNOSIS — C3412 Malignant neoplasm of upper lobe, left bronchus or lung: Secondary | ICD-10-CM

## 2016-01-21 NOTE — Telephone Encounter (Signed)
New Message   4. Are you calling to see if we received your device transmission? Pt wife request a call back to determine if the remote check is automatic or manual. Please call

## 2016-01-21 NOTE — Progress Notes (Signed)
Patient's CT chest has been scheduled.  I have scheduled his follow-up visit with me for 1-week after the scan per patient's request.  I have mailed him a letter and an appointment calendar to his home re: these appointments in 2018.  I encouraged him contact me with any questions or concerns before his next appt here at the cancer center and we can certainly see him sooner if needed.  I look forward to participating in his care!  Mike Craze, NP [place] [phone]

## 2016-01-21 NOTE — Telephone Encounter (Signed)
[name]'s call.  Advised that remote transmission came across automatically this morning and that she does not need to do anything at this time.  She verbalizes appreciation and denies questions at this time.

## 2016-01-22 NOTE — Progress Notes (Signed)
Remote ICD transmission.   

## 2016-01-23 ENCOUNTER — Ambulatory Visit (INDEPENDENT_AMBULATORY_CARE_PROVIDER_SITE_OTHER): Payer: BLUE CROSS/BLUE SHIELD | Admitting: Pharmacist

## 2016-01-23 DIAGNOSIS — I82409 Acute embolism and thrombosis of unspecified deep veins of unspecified lower extremity: Secondary | ICD-10-CM

## 2016-01-23 LAB — POCT INR: INR: 2.1

## 2016-01-23 NOTE — Patient Instructions (Signed)
Anticoagulation Dose Instructions as of 01/23/2016      Dorene Grebe Tue Wed Thu Fri Sat   New Dose 4 mg 2 mg 4 mg 2 mg 4 mg 2 mg 2 mg    Description        Continue current warfarin dose of '2mg'$  tablets - take 2 tablets = '4mg'$  on sundays, tuesdays and thursdays and 1 tablet = '2mg'$  all other days.     INR was 2.1 today

## 2016-01-24 LAB — CUP PACEART REMOTE DEVICE CHECK
Battery Remaining Longevity: 72 mo
Battery Remaining Percentage: 72 %
Battery Voltage: 2.96 V
Brady Statistic RV Percent Paced: 1 %
Date Time Interrogation Session: 20170123090014
HighPow Impedance: 91 Ohm
HighPow Impedance: 91 Ohm
Implantable Lead Implant Date: 20140205
Implantable Lead Location: 753860
Implantable Lead Model: 181
Implantable Lead Serial Number: 323859
Lead Channel Impedance Value: 460 Ohm
Lead Channel Sensing Intrinsic Amplitude: 11.8 mV
Lead Channel Setting Pacing Amplitude: 2.5 V
Lead Channel Setting Pacing Pulse Width: 0.5 ms
Lead Channel Setting Sensing Sensitivity: 0.5 mV
Pulse Gen Serial Number: 7040910

## 2016-01-30 ENCOUNTER — Encounter: Payer: Self-pay | Admitting: Cardiology

## 2016-01-30 DIAGNOSIS — H269 Unspecified cataract: Secondary | ICD-10-CM

## 2016-01-30 HISTORY — DX: Unspecified cataract: H26.9

## 2016-02-20 ENCOUNTER — Ambulatory Visit (INDEPENDENT_AMBULATORY_CARE_PROVIDER_SITE_OTHER): Payer: BLUE CROSS/BLUE SHIELD | Admitting: Pharmacist

## 2016-02-20 DIAGNOSIS — I824Z9 Acute embolism and thrombosis of unspecified deep veins of unspecified distal lower extremity: Secondary | ICD-10-CM

## 2016-02-20 DIAGNOSIS — Z7901 Long term (current) use of anticoagulants: Secondary | ICD-10-CM | POA: Diagnosis not present

## 2016-02-20 LAB — POCT INR: INR: 1.6

## 2016-02-20 NOTE — Patient Instructions (Signed)
Anticoagulation Dose Instructions as of 02/20/2016      Travis Cruz Tue Wed Thu Fri Sat   New Dose 4 mg 2 mg 4 mg 2 mg 4 mg 2 mg 2 mg    Description        Take and extra 1 tablet today , then continue current warfarin dose of '2mg'$  tablets - take 2 tablets = '4mg'$  on sundays, tuesdays and thursdays and 1 tablet = '2mg'$  all other days.     INR was 1.6 today

## 2016-03-05 ENCOUNTER — Ambulatory Visit (INDEPENDENT_AMBULATORY_CARE_PROVIDER_SITE_OTHER): Payer: BLUE CROSS/BLUE SHIELD | Admitting: Pharmacist

## 2016-03-05 DIAGNOSIS — Z7901 Long term (current) use of anticoagulants: Secondary | ICD-10-CM | POA: Diagnosis not present

## 2016-03-05 DIAGNOSIS — I824Z9 Acute embolism and thrombosis of unspecified deep veins of unspecified distal lower extremity: Secondary | ICD-10-CM | POA: Diagnosis not present

## 2016-03-05 DIAGNOSIS — I2699 Other pulmonary embolism without acute cor pulmonale: Secondary | ICD-10-CM

## 2016-03-05 LAB — COAGUCHEK XS/INR WAIVED
INR: 1.9 — ABNORMAL HIGH (ref 0.9–1.1)
Prothrombin Time: 22.4 s

## 2016-03-05 NOTE — Patient Instructions (Signed)
Anticoagulation Dose Instructions as of 03/05/2016      Travis Cruz Tue Wed Thu Fri Sat   New Dose 4 mg 2 mg 4 mg 2 mg 4 mg 2 mg 2 mg    Description        Take and extra 1 tablet today , then continue current warfarin dose of '2mg'$  tablets - take 2 tablets = '4mg'$  on sundays, tuesdays and thursdays and 1 tablet = '2mg'$  all other days.     INR was 1.9 today

## 2016-04-02 ENCOUNTER — Ambulatory Visit (INDEPENDENT_AMBULATORY_CARE_PROVIDER_SITE_OTHER): Payer: BLUE CROSS/BLUE SHIELD | Admitting: Pharmacist

## 2016-04-02 DIAGNOSIS — I2699 Other pulmonary embolism without acute cor pulmonale: Secondary | ICD-10-CM

## 2016-04-02 DIAGNOSIS — Z7901 Long term (current) use of anticoagulants: Secondary | ICD-10-CM

## 2016-04-02 DIAGNOSIS — I824Z9 Acute embolism and thrombosis of unspecified deep veins of unspecified distal lower extremity: Secondary | ICD-10-CM | POA: Diagnosis not present

## 2016-04-02 LAB — COAGUCHEK XS/INR WAIVED
INR: 1.9 — ABNORMAL HIGH (ref 0.9–1.1)
Prothrombin Time: 22.5 s

## 2016-04-02 NOTE — Patient Instructions (Signed)
Anticoagulation Dose Instructions as of 04/02/2016      Travis Cruz Tue Wed Thu Fri Sat   New Dose 4 mg 2 mg 4 mg 2 mg 4 mg 2 mg 4 mg    Description        Take an extra 1 tablet today - April 02, 2016,  Then increase warfarin dose to '2mg'$  tablets - take 1 tablet on mondays, wednesdays and fridays.  Take 2 tablets all other days.     INR was 1.9 today

## 2016-04-15 ENCOUNTER — Ambulatory Visit (INDEPENDENT_AMBULATORY_CARE_PROVIDER_SITE_OTHER): Payer: BLUE CROSS/BLUE SHIELD

## 2016-04-15 ENCOUNTER — Ambulatory Visit (INDEPENDENT_AMBULATORY_CARE_PROVIDER_SITE_OTHER): Payer: BLUE CROSS/BLUE SHIELD | Admitting: Family Medicine

## 2016-04-15 ENCOUNTER — Encounter: Payer: Self-pay | Admitting: Family Medicine

## 2016-04-15 VITALS — BP 123/86 | HR 105 | Temp 97.8°F | Ht 69.0 in | Wt 213.0 lb

## 2016-04-15 DIAGNOSIS — R509 Fever, unspecified: Secondary | ICD-10-CM | POA: Diagnosis not present

## 2016-04-15 DIAGNOSIS — R0602 Shortness of breath: Secondary | ICD-10-CM

## 2016-04-15 LAB — URINALYSIS, COMPLETE
Bilirubin, UA: NEGATIVE
Glucose, UA: NEGATIVE
Ketones, UA: NEGATIVE
Leukocytes, UA: NEGATIVE
Nitrite, UA: NEGATIVE
Protein, UA: NEGATIVE
Specific Gravity, UA: 1.025 (ref 1.005–1.030)
Urobilinogen, Ur: 0.2 mg/dL (ref 0.2–1.0)
pH, UA: 5 (ref 5.0–7.5)

## 2016-04-15 LAB — MICROSCOPIC EXAMINATION
Bacteria, UA: NONE SEEN
Epithelial Cells (non renal): NONE SEEN /hpf (ref 0–10)
WBC, UA: NONE SEEN /hpf (ref 0–?)

## 2016-04-15 NOTE — Progress Notes (Signed)
   Subjective:    Patient ID: Travis Cruz, male    DOB: 02-05-1946, 70 y.o.   MRN: [id number]  HPI Patient here today for fever that started 2 days ago.States he just doesn't feel well but cannot be very specific. He denies cough, myalgias, headache,. There are no urinary symptoms although he does have some urinary frequency.     Patient Active Problem List   Diagnosis Date Noted  . Macular degeneration, dry 12/20/2015  . Cellulitis and abscess 09/27/2015  . COPD (chronic obstructive pulmonary disease) ([place])   . Implantable cardioverter-defibrillator single chamber [employer] 10/05/2013  . Cancer of upper lobe of lung ([place]) 09/16/2013  . Abnormal chest x-ray 05/13/2013  . COPD exacerbation ([place]) 05/13/2013  . HLD (hyperlipidemia) 05/13/2013  . CAD (coronary atherosclerotic disease) 05/13/2013  . Solitary pulmonary nodule 03/30/2013  . Transient ischemic attack (TIA), and cerebral infarction without residual deficits 03/28/2013  . H/O ventricular fibrillation 03/28/2013  . Ischemic cardiomyopathy 03/15/2013  . NSTEMI (non-ST elevated myocardial infarction) ([place]) 01/27/2013  . Obstructive sleep apnea 03/10/2012  . Patent arterial duct 03/10/2012  . HTN (hypertension) 03/24/2011  . Deep vein thrombosis (DVT) ([place]) 09/23/2008  . PE (pulmonary embolism) 09/23/2008   Outpatient Encounter Prescriptions as of 04/15/2016  Medication Sig  . atorvastatin (LIPITOR) 80 MG tablet TAKE 1 TABLET BY MOUTH THREE TIMES PER WEEK  . levETIRAcetam (KEPPRA) 500 MG tablet TAKE 1 TABLET (500 MG TOTAL) BY MOUTH 2 TIMES DAILY.  . Multiple Vitamin (MULTIVITAMIN) tablet Take 1 tablet by mouth daily.  . Multiple Vitamins-Minerals (PRESERVISION AREDS 2) CAPS Take by mouth.  . tiotropium (SPIRIVA HANDIHALER) 18 MCG inhalation capsule PLACE 1 CAPSULE (18 MCG TOTAL) INTO INHALER AND INHALE DAILY.  [name] warfarin (COUMADIN) 2 MG tablet TAKE '4MG'$  (=2 TABLETS) ON MONDAYS, WEDNESDAYS AND FRIDAYS AND '2MG'$  ALL OTHER  DAYS   No facility-administered encounter medications on file as of 04/15/2016.      Review of Systems  Constitutional: Positive for fever (x 2 days).  HENT: Negative.   Eyes: Negative.   Respiratory: Positive for shortness of breath (doesnt feel too SOB - oxygen level 88- 93 %).   Cardiovascular: Negative.   Gastrointestinal: Negative.   Endocrine: Negative.   Genitourinary: Negative.   Musculoskeletal: Negative.   Skin: Negative.   Allergic/Immunologic: Negative.   Neurological: Negative.   Hematological: Negative.   Psychiatric/Behavioral: Negative.        Objective:   Physical Exam  Constitutional: He appears well-developed and well-nourished.  HENT:  Head: Normocephalic.  Cardiovascular: Normal rate, regular rhythm and normal heart sounds.   Pulmonary/Chest: Effort normal. He has rales (rales on left side).  Neurological: He is alert.   BP 123/86 mmHg  Pulse 105  Temp(Src) 97.8 F (36.6 C) (Oral)  Ht '5\' 9"'$  (1.753 m)  Wt 213 lb (96.616 kg)  BMI 31.44 kg/m2  SpO2 90%        Assessment & Plan:  1. Fever, unspecified fever cause Urinalysis has some red cells and will be cultured. Awaiting official chest x-ray report. There are some chronic changes related to his lung cancer and treatments. - Urinalysis, Complete - Urine culture - DG Chest 2 View; Future - CBC with Differential/Platelet  2. SOB (shortness of breath) Even though he is satting lower than usual he has denied shortness of breath. - DG Chest 2 View; Future  Wardell Honour MD - CBC with Differential/Platelet

## 2016-04-16 ENCOUNTER — Telehealth: Payer: Self-pay | Admitting: Family Medicine

## 2016-04-16 LAB — URINE CULTURE

## 2016-04-16 LAB — CBC WITH DIFFERENTIAL/PLATELET
Basophils Absolute: 0.1 10*3/uL (ref 0.0–0.2)
Basos: 1 %
EOS (ABSOLUTE): 0.2 10*3/uL (ref 0.0–0.4)
Eos: 3 %
Hematocrit: 55.2 % — ABNORMAL HIGH (ref 37.5–51.0)
Hemoglobin: 18.1 g/dL — ABNORMAL HIGH (ref 12.6–17.7)
Immature Grans (Abs): 0 10*3/uL (ref 0.0–0.1)
Immature Granulocytes: 0 %
Lymphocytes Absolute: 2.9 10*3/uL (ref 0.7–3.1)
Lymphs: 30 %
MCH: 29.3 pg (ref 26.6–33.0)
MCHC: 32.8 g/dL (ref 31.5–35.7)
MCV: 89 fL (ref 79–97)
Monocytes Absolute: 1.1 10*3/uL — ABNORMAL HIGH (ref 0.1–0.9)
Monocytes: 12 %
Neutrophils Absolute: 5.1 10*3/uL (ref 1.4–7.0)
Neutrophils: 54 %
Platelets: 147 10*3/uL — ABNORMAL LOW (ref 150–379)
RBC: 6.18 x10E6/uL — ABNORMAL HIGH (ref 4.14–5.80)
RDW: 14.8 % (ref 12.3–15.4)
WBC: 9.4 10*3/uL (ref 3.4–10.8)

## 2016-04-19 NOTE — Telephone Encounter (Signed)
Patient aware of results.

## 2016-04-22 ENCOUNTER — Ambulatory Visit: Payer: BLUE CROSS/BLUE SHIELD | Admitting: Family Medicine

## 2016-04-24 ENCOUNTER — Ambulatory Visit (INDEPENDENT_AMBULATORY_CARE_PROVIDER_SITE_OTHER): Payer: BLUE CROSS/BLUE SHIELD | Admitting: Family Medicine

## 2016-04-24 ENCOUNTER — Encounter: Payer: Self-pay | Admitting: Family Medicine

## 2016-04-24 VITALS — BP 123/82 | HR 108 | Temp 97.6°F | Ht 69.0 in | Wt 215.6 lb

## 2016-04-24 DIAGNOSIS — Z7901 Long term (current) use of anticoagulants: Secondary | ICD-10-CM | POA: Diagnosis not present

## 2016-04-24 DIAGNOSIS — I10 Essential (primary) hypertension: Secondary | ICD-10-CM

## 2016-04-24 DIAGNOSIS — E785 Hyperlipidemia, unspecified: Secondary | ICD-10-CM | POA: Diagnosis not present

## 2016-04-24 DIAGNOSIS — G4733 Obstructive sleep apnea (adult) (pediatric): Secondary | ICD-10-CM | POA: Diagnosis not present

## 2016-04-24 LAB — MICROSCOPIC EXAMINATION
Bacteria, UA: NONE SEEN
Epithelial Cells (non renal): NONE SEEN /hpf (ref 0–10)
RBC, UA: NONE SEEN /hpf (ref 0–?)
WBC, UA: NONE SEEN /hpf (ref 0–?)

## 2016-04-24 LAB — URINALYSIS, COMPLETE
Bilirubin, UA: NEGATIVE
Glucose, UA: NEGATIVE
Ketones, UA: NEGATIVE
Leukocytes, UA: NEGATIVE
Nitrite, UA: NEGATIVE
Protein, UA: NEGATIVE
RBC, UA: NEGATIVE
Specific Gravity, UA: 1.01 (ref 1.005–1.030)
Urobilinogen, Ur: 0.2 mg/dL (ref 0.2–1.0)
pH, UA: 7 (ref 5.0–7.5)

## 2016-04-24 LAB — COAGUCHEK XS/INR WAIVED
INR: 1.9 — ABNORMAL HIGH (ref 0.9–1.1)
Prothrombin Time: 23.4 s

## 2016-04-24 NOTE — Progress Notes (Signed)
   Subjective:    Patient ID: Travis Cruz, male    DOB: 1946-05-17, 70 y.o.   MRN: [id number]  HPI 70 year old gentleman here for follow-up COPD. He was seen last week with fever. Chest x-ray CBC urine culture all were negative so we assume that he had some viral infection. Fever has resolved. His oxygen saturations are back to baseline and he feels well. He does complain of some dependent edema by end of the day but he does a lot of sitting during the day.  Patient Active Problem List   Diagnosis Date Noted  . Macular degeneration, dry 12/20/2015  . Cellulitis and abscess 09/27/2015  . COPD (chronic obstructive pulmonary disease) ([place])   . Implantable cardioverter-defibrillator single chamber [employer] 10/05/2013  . Cancer of upper lobe of lung ([place]) 09/16/2013  . Abnormal chest x-ray 05/13/2013  . COPD exacerbation ([place]) 05/13/2013  . HLD (hyperlipidemia) 05/13/2013  . CAD (coronary atherosclerotic disease) 05/13/2013  . Solitary pulmonary nodule 03/30/2013  . Transient ischemic attack (TIA), and cerebral infarction without residual deficits 03/28/2013  . H/O ventricular fibrillation 03/28/2013  . Ischemic cardiomyopathy 03/15/2013  . NSTEMI (non-ST elevated myocardial infarction) ([place]) 01/27/2013  . Obstructive sleep apnea 03/10/2012  . Patent arterial duct 03/10/2012  . HTN (hypertension) 03/24/2011  . Deep vein thrombosis (DVT) ([place]) 09/23/2008  . PE (pulmonary embolism) 09/23/2008   Outpatient Encounter Prescriptions as of 04/24/2016  Medication Sig  . atorvastatin (LIPITOR) 80 MG tablet TAKE 1 TABLET BY MOUTH THREE TIMES PER WEEK  . levETIRAcetam (KEPPRA) 500 MG tablet TAKE 1 TABLET (500 MG TOTAL) BY MOUTH 2 TIMES DAILY.  . Multiple Vitamin (MULTIVITAMIN) tablet Take 1 tablet by mouth daily.  . Multiple Vitamins-Minerals (PRESERVISION AREDS 2) CAPS Take by mouth.  . tiotropium (SPIRIVA HANDIHALER) 18 MCG inhalation capsule PLACE 1 CAPSULE (18 MCG TOTAL) INTO INHALER AND  INHALE DAILY.  [name] warfarin (COUMADIN) 2 MG tablet TAKE '4MG'$  (=2 TABLETS) ON MONDAYS, WEDNESDAYS AND FRIDAYS AND '2MG'$  ALL OTHER DAYS   No facility-administered encounter medications on file as of 04/24/2016.      Review of Systems  Constitutional: Negative.   Respiratory: Negative.   Cardiovascular: Positive for leg swelling.  Gastrointestinal: Negative.   Neurological: Negative.   Psychiatric/Behavioral: Negative.        Objective:   Physical Exam  Constitutional: He appears well-developed and well-nourished.  Cardiovascular: Normal rate, regular rhythm and normal heart sounds.   Pulmonary/Chest: Effort normal and breath sounds normal.  Skin:  Again has some intermittent serous drainage from a wound in the left popliteal fossa          Assessment & Plan:  1. Chronic anticoagulation ETI hours 1.9. Recommend continuing same dose and recheck in one month - CoaguChek XS/INR Waived - Urinalysis, Complete  2. Essential hypertension Blood pressure today 123/82. He is on no medication - CoaguChek XS/INR Waived - Urinalysis, Complete  3. HLD (hyperlipidemia) Lipids were at goal when last checked 6 months ago. He takes atorvastatin 80 mg 3 times a week - CoaguChek XS/INR Waived - Urinalysis, Complete  Wardell Honour MD

## 2016-05-07 ENCOUNTER — Other Ambulatory Visit: Payer: Self-pay | Admitting: Family Medicine

## 2016-05-07 MED ORDER — TIOTROPIUM BROMIDE MONOHYDRATE 18 MCG IN CAPS: ORAL_CAPSULE | RESPIRATORY_TRACT | Status: DC

## 2016-05-07 NOTE — Telephone Encounter (Signed)
done

## 2016-05-16 ENCOUNTER — Other Ambulatory Visit: Payer: Self-pay

## 2016-05-16 MED ORDER — WARFARIN SODIUM 2 MG PO TABS: ORAL_TABLET | ORAL | Status: DC

## 2016-05-21 ENCOUNTER — Ambulatory Visit (INDEPENDENT_AMBULATORY_CARE_PROVIDER_SITE_OTHER): Payer: BLUE CROSS/BLUE SHIELD | Admitting: Pharmacist

## 2016-05-21 DIAGNOSIS — Z7901 Long term (current) use of anticoagulants: Secondary | ICD-10-CM

## 2016-05-21 DIAGNOSIS — I824Z9 Acute embolism and thrombosis of unspecified deep veins of unspecified distal lower extremity: Secondary | ICD-10-CM

## 2016-05-21 DIAGNOSIS — I2699 Other pulmonary embolism without acute cor pulmonale: Secondary | ICD-10-CM | POA: Diagnosis not present

## 2016-05-21 LAB — COAGUCHEK XS/INR WAIVED
INR: 2.6 — ABNORMAL HIGH (ref 0.9–1.1)
Prothrombin Time: 31.6 s

## 2016-05-21 NOTE — Patient Instructions (Signed)
Anticoagulation Dose Instructions as of 05/21/2016      Dorene Grebe Tue Wed Thu Fri Sat   New Dose 4 mg 2 mg 4 mg 2 mg 4 mg 2 mg 4 mg    Description        Take an extra 1 tablet today - April 02, 2016,  Then increase warfarin dose to '2mg'$  tablets - take 1 tablet on mondays, wednesdays and fridays.  Take 2 tablets all other days.     INR was 2.6 today

## 2016-06-25 ENCOUNTER — Ambulatory Visit (INDEPENDENT_AMBULATORY_CARE_PROVIDER_SITE_OTHER): Payer: BLUE CROSS/BLUE SHIELD | Admitting: Pharmacist

## 2016-06-25 ENCOUNTER — Encounter: Payer: Self-pay | Admitting: Pharmacist

## 2016-06-25 DIAGNOSIS — Z7901 Long term (current) use of anticoagulants: Secondary | ICD-10-CM

## 2016-06-25 DIAGNOSIS — I2699 Other pulmonary embolism without acute cor pulmonale: Secondary | ICD-10-CM | POA: Diagnosis not present

## 2016-06-25 DIAGNOSIS — I824Z9 Acute embolism and thrombosis of unspecified deep veins of unspecified distal lower extremity: Secondary | ICD-10-CM | POA: Diagnosis not present

## 2016-06-25 LAB — COAGUCHEK XS/INR WAIVED
INR: 1.6 — ABNORMAL HIGH (ref 0.9–1.1)
Prothrombin Time: 19.5 s

## 2016-06-25 NOTE — Patient Instructions (Signed)
Anticoagulation Dose Instructions as of 06/25/2016      Travis Cruz Tue Wed Thu Fri Sat   New Dose 4 mg 2 mg 4 mg 4 mg 4 mg 2 mg 4 mg    Description        Increase warfarin dose to '2mg'$  tablets - take 1 tablet on mondays,  and fridays.  Take 2 tablets all other days.     INR was 1.6 today

## 2016-06-30 ENCOUNTER — Encounter: Payer: Self-pay | Admitting: Internal Medicine

## 2016-06-30 ENCOUNTER — Ambulatory Visit (INDEPENDENT_AMBULATORY_CARE_PROVIDER_SITE_OTHER): Payer: BLUE CROSS/BLUE SHIELD | Admitting: Internal Medicine

## 2016-06-30 VITALS — BP 112/80 | HR 91 | Ht 69.0 in | Wt 217.2 lb

## 2016-06-30 DIAGNOSIS — I472 Ventricular tachycardia, unspecified: Secondary | ICD-10-CM

## 2016-06-30 DIAGNOSIS — I255 Ischemic cardiomyopathy: Secondary | ICD-10-CM | POA: Diagnosis not present

## 2016-06-30 DIAGNOSIS — I4901 Ventricular fibrillation: Secondary | ICD-10-CM

## 2016-06-30 DIAGNOSIS — I5022 Chronic systolic (congestive) heart failure: Secondary | ICD-10-CM

## 2016-06-30 DIAGNOSIS — Z95810 Presence of automatic (implantable) cardiac defibrillator: Secondary | ICD-10-CM

## 2016-06-30 LAB — CUP PACEART INCLINIC DEVICE CHECK
Battery Remaining Longevity: 69.6
Brady Statistic RV Percent Paced: 0 %
Date Time Interrogation Session: 20170703132436
HighPow Impedance: 86.625
Implantable Lead Implant Date: 20140205
Implantable Lead Location: 753860
Implantable Lead Model: 181
Implantable Lead Serial Number: 323859
Lead Channel Impedance Value: 487.5 Ohm
Lead Channel Pacing Threshold Amplitude: 0.75 V
Lead Channel Pacing Threshold Amplitude: 0.75 V
Lead Channel Pacing Threshold Pulse Width: 0.5 ms
Lead Channel Pacing Threshold Pulse Width: 0.5 ms
Lead Channel Sensing Intrinsic Amplitude: 11.8 mV
Lead Channel Setting Pacing Amplitude: 2.5 V
Lead Channel Setting Pacing Pulse Width: 0.5 ms
Lead Channel Setting Sensing Sensitivity: 0.5 mV
Pulse Gen Serial Number: 7040910
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
## 2016-06-30 NOTE — Patient Instructions (Signed)
Medication Instructions: - Your physician recommends that you continue on your current medications as directed. Please refer to the Current Medication list given to you today.  Labwork: - none  Procedures/Testing: - Your physician has requested that you have an echocardiogram. Echocardiography is a painless test that uses sound waves to create images of your heart. It provides your doctor with information about the size and shape of your heart and how well your heart's chambers and valves are working. This procedure takes approximately one hour. There are no restrictions for this procedure.  Follow-Up: - Remote monitoring is used to monitor your Pacemaker of ICD from home. This monitoring reduces the number of office visits required to check your device to one time per year. It allows [place] to keep an eye on the functioning of your device to ensure it is working properly. You are scheduled for a device check from home on 09/29/16. You may send your transmission at any time that day. If you have a wireless device, the transmission will be sent automatically. After your physician reviews your transmission, you will receive a postcard with your next transmission date.  - Your physician wants you to follow-up in: 1 year with Dr. Caryl Comes. You will receive a reminder letter in the mail two months in advance. If you don't receive a letter, please call our office to schedule the follow-up appointment.  Any Additional Special Instructions Will Be Listed Below (If Applicable).     If you need a refill on your cardiac medications before your next appointment, please call your pharmacy.

## 2016-07-09 ENCOUNTER — Ambulatory Visit (INDEPENDENT_AMBULATORY_CARE_PROVIDER_SITE_OTHER): Payer: BLUE CROSS/BLUE SHIELD | Admitting: Pharmacist

## 2016-07-09 DIAGNOSIS — I2699 Other pulmonary embolism without acute cor pulmonale: Secondary | ICD-10-CM | POA: Diagnosis not present

## 2016-07-09 DIAGNOSIS — Z7901 Long term (current) use of anticoagulants: Secondary | ICD-10-CM

## 2016-07-09 DIAGNOSIS — I824Z9 Acute embolism and thrombosis of unspecified deep veins of unspecified distal lower extremity: Secondary | ICD-10-CM

## 2016-07-09 LAB — COAGUCHEK XS/INR WAIVED
INR: 1.8 — ABNORMAL HIGH (ref 0.9–1.1)
Prothrombin Time: 22.2 s

## 2016-07-09 NOTE — Patient Instructions (Signed)
Anticoagulation Dose Instructions as of 07/09/2016      Travis Cruz Tue Wed Thu Fri Sat   New Dose 4 mg 4 mg 4 mg 4 mg 4 mg 2 mg 4 mg    Description        Take extra 1/2 tablet today (= '5mg'$  total for today).  Then increase dose to 2 tablets (= '4mg'$ ) daily except on Fridays take 1 tablet (='2mg'$ )     INR was 1.8 today

## 2016-07-16 ENCOUNTER — Other Ambulatory Visit: Payer: Self-pay

## 2016-07-16 ENCOUNTER — Ambulatory Visit (HOSPITAL_COMMUNITY): Payer: BLUE CROSS/BLUE SHIELD | Attending: Cardiovascular Disease

## 2016-07-16 DIAGNOSIS — E785 Hyperlipidemia, unspecified: Secondary | ICD-10-CM | POA: Insufficient documentation

## 2016-07-16 DIAGNOSIS — J449 Chronic obstructive pulmonary disease, unspecified: Secondary | ICD-10-CM | POA: Insufficient documentation

## 2016-07-16 DIAGNOSIS — I255 Ischemic cardiomyopathy: Secondary | ICD-10-CM | POA: Insufficient documentation

## 2016-07-16 DIAGNOSIS — I110 Hypertensive heart disease with heart failure: Secondary | ICD-10-CM | POA: Insufficient documentation

## 2016-07-16 DIAGNOSIS — I4901 Ventricular fibrillation: Secondary | ICD-10-CM | POA: Diagnosis not present

## 2016-07-16 DIAGNOSIS — I472 Ventricular tachycardia, unspecified: Secondary | ICD-10-CM

## 2016-07-16 DIAGNOSIS — Z95810 Presence of automatic (implantable) cardiac defibrillator: Secondary | ICD-10-CM | POA: Insufficient documentation

## 2016-07-16 DIAGNOSIS — I5022 Chronic systolic (congestive) heart failure: Secondary | ICD-10-CM | POA: Insufficient documentation

## 2016-07-16 LAB — ECHOCARDIOGRAM COMPLETE

## 2016-07-16 MED ORDER — PERFLUTREN LIPID MICROSPHERE
1.0000 mL | INTRAVENOUS | Status: AC | PRN
  Administered 2016-07-16: 2 mL via INTRAVENOUS

## 2016-07-24 ENCOUNTER — Telehealth: Payer: Self-pay | Admitting: Internal Medicine

## 2016-07-24 NOTE — Telephone Encounter (Signed)
Informed patient's wife (DPR) that Dr. Caryl Comes needs to review echo results, and that his nurse will give her a call next week to inform them of results. Patient's wife verbalized understanding.

## 2016-07-24 NOTE — Telephone Encounter (Signed)
New message   Pt wife verbalized that she is calling for the rn to give her the results of the pt echo

## 2016-07-29 DIAGNOSIS — 419620001 Death: Secondary | SNOMED CT | POA: Insufficient documentation

## 2016-07-29 DEATH — deceased

## 2016-08-06 ENCOUNTER — Ambulatory Visit (INDEPENDENT_AMBULATORY_CARE_PROVIDER_SITE_OTHER): Payer: BLUE CROSS/BLUE SHIELD | Admitting: Pharmacist

## 2016-08-06 DIAGNOSIS — I2699 Other pulmonary embolism without acute cor pulmonale: Secondary | ICD-10-CM | POA: Diagnosis not present

## 2016-08-06 DIAGNOSIS — Z7901 Long term (current) use of anticoagulants: Secondary | ICD-10-CM

## 2016-08-06 DIAGNOSIS — I824Z9 Acute embolism and thrombosis of unspecified deep veins of unspecified distal lower extremity: Secondary | ICD-10-CM

## 2016-08-06 LAB — COAGUCHEK XS/INR WAIVED
INR: 3.2 — ABNORMAL HIGH (ref 0.9–1.1)
Prothrombin Time: 38.3 s

## 2016-09-04 ENCOUNTER — Other Ambulatory Visit: Payer: Self-pay

## 2016-09-04 MED ORDER — TIOTROPIUM BROMIDE MONOHYDRATE 18 MCG IN CAPS: ORAL_CAPSULE | RESPIRATORY_TRACT | 1 refills | Status: DC

## 2016-09-10 ENCOUNTER — Encounter: Payer: Self-pay | Admitting: Pediatrics

## 2016-09-10 ENCOUNTER — Ambulatory Visit (INDEPENDENT_AMBULATORY_CARE_PROVIDER_SITE_OTHER): Payer: BLUE CROSS/BLUE SHIELD | Admitting: Pediatrics

## 2016-09-10 ENCOUNTER — Ambulatory Visit (INDEPENDENT_AMBULATORY_CARE_PROVIDER_SITE_OTHER): Payer: BLUE CROSS/BLUE SHIELD | Admitting: Pharmacist

## 2016-09-10 VITALS — BP 123/82 | Temp 97.3°F | Ht 69.0 in | Wt 219.0 lb

## 2016-09-10 DIAGNOSIS — H6192 Disorder of left external ear, unspecified: Secondary | ICD-10-CM

## 2016-09-10 DIAGNOSIS — I10 Essential (primary) hypertension: Secondary | ICD-10-CM | POA: Diagnosis not present

## 2016-09-10 DIAGNOSIS — L219 Seborrheic dermatitis, unspecified: Secondary | ICD-10-CM | POA: Diagnosis not present

## 2016-09-10 DIAGNOSIS — Z7901 Long term (current) use of anticoagulants: Secondary | ICD-10-CM

## 2016-09-10 DIAGNOSIS — I824Z9 Acute embolism and thrombosis of unspecified deep veins of unspecified distal lower extremity: Secondary | ICD-10-CM

## 2016-09-10 DIAGNOSIS — I2699 Other pulmonary embolism without acute cor pulmonale: Secondary | ICD-10-CM

## 2016-09-10 LAB — COAGUCHEK XS/INR WAIVED
INR: 3 — ABNORMAL HIGH (ref 0.9–1.1)
Prothrombin Time: 36 s

## 2016-09-10 NOTE — Patient Instructions (Signed)
Let me know if ear starts to get worse, any tingling or facial pain, eye pain, or if further blisters appear on ear or elsewhere on body.

## 2016-09-10 NOTE — Progress Notes (Signed)
  Subjective:   Patient ID: Travis Cruz, male    DOB: 06/19/1946, 70 y.o.   MRN: [id number] CC: Left ear lesion (x > 1 wk)  HPI: Travis Cruz is a 70 y.o. male presenting for Left ear lesion (x > 1 wk)  About a week ago started having soreness in ear A few days later noticed scabbed over area on ear He doesn't think he scratched it No lesion on ear before a week ago No blisters on face, or in ear canal No fevers Feeling well otherwise Eyes watery regularly, no worse than usual Follows with eye doctor regularly  Some flaking skin in side burns b/l  HTN: taking meds No HA, no chest pain   Relevant past medical, surgical, family and social history reviewed. Allergies and medications reviewed and updated. History  Smoking Status  . Former Smoker  . Packs/day: 1.30  . Years: 40.00  . Types: Cigarettes  . Quit date: 07/22/2008  Smokeless Tobacco  . Former Systems developer  . Types: Chew   ROS: Per HPI   Objective:    BP 123/82 (BP Location: Left Arm, Patient Position: Sitting, Cuff Size: Large)   Temp 97.3 F (36.3 C) (Oral)   Ht '5\' 9"'$  (1.753 m)   Wt 219 lb (99.3 kg)   BMI 32.34 kg/m   Wt Readings from Last 3 Encounters:  09/10/16 219 lb (99.3 kg)  06/30/16 217 lb 3.2 oz (98.5 kg)  04/24/16 215 lb 9.6 oz (97.8 kg)    Gen: NAD, alert, cooperative with exam, NCAT EYES: EOMI, R eye w/ slightly injected conjunctiva, no redness L eye ENT:  TMs pearly gray b/l, OP without erythema LYMPH: no cervical LAD CV: NRRR, normal S1/S2 Resp: CTABL, no wheezes, normal WOB Ext: No edema, warm Neuro: Alert and oriented Skin: L top of ear with a couple of 4-5 mm scabbed over lesions. No redness, tendernss, drainage L sideburn with flaking skin, some flaky skin behind L ear  Assessment & Plan:  Travis Cruz was seen today for left ear lesion.  Diagnoses and all orders for this visit:  Seborrheic dermatitis Present in sidebruns, behind ears Twice weekly OTC antidandruff  shampoo If not improving let me know  Ear lesion No lesions in ear canal, just top of ear, now scabbed over Present for a week May have scratched it but doesn't remember ddx includes shingles, SCC Out of window for treatment if shingles Low threshold for biopsy if not improving  Essential hypertension Well controlled, not currently on any medications  Follow up plan: Return if symptoms worsen or fail to improve. Assunta Found, MD [place]

## 2016-09-20 DIAGNOSIS — J449 Chronic obstructive pulmonary disease, unspecified: Secondary | ICD-10-CM | POA: Diagnosis not present

## 2016-09-24 ENCOUNTER — Ambulatory Visit (INDEPENDENT_AMBULATORY_CARE_PROVIDER_SITE_OTHER): Payer: BLUE CROSS/BLUE SHIELD | Admitting: Ophthalmology

## 2016-09-24 DIAGNOSIS — I10 Essential (primary) hypertension: Secondary | ICD-10-CM

## 2016-09-24 DIAGNOSIS — H353132 Nonexudative age-related macular degeneration, bilateral, intermediate dry stage: Secondary | ICD-10-CM

## 2016-09-24 DIAGNOSIS — H43813 Vitreous degeneration, bilateral: Secondary | ICD-10-CM

## 2016-09-24 DIAGNOSIS — H35033 Hypertensive retinopathy, bilateral: Secondary | ICD-10-CM

## 2016-09-29 ENCOUNTER — Ambulatory Visit (INDEPENDENT_AMBULATORY_CARE_PROVIDER_SITE_OTHER): Payer: BLUE CROSS/BLUE SHIELD | Admitting: *Deleted

## 2016-09-29 DIAGNOSIS — I255 Ischemic cardiomyopathy: Secondary | ICD-10-CM | POA: Diagnosis not present

## 2016-09-29 NOTE — Progress Notes (Signed)
Remote ICD transmission.   

## 2016-10-01 ENCOUNTER — Encounter: Payer: Self-pay | Admitting: Cardiology

## 2016-10-07 LAB — CUP PACEART REMOTE DEVICE CHECK
Battery Remaining Longevity: 67 mo
Battery Remaining Percentage: 66 %
Battery Voltage: 2.96 V
Brady Statistic RV Percent Paced: 0 %
Date Time Interrogation Session: 20171002080016
HighPow Impedance: 88 Ohm
HighPow Impedance: 88 Ohm
Implantable Lead Implant Date: 20140205
Implantable Lead Location: 753860
Implantable Lead Model: 181
Implantable Lead Serial Number: 323859
Lead Channel Impedance Value: 440 Ohm
Lead Channel Pacing Threshold Amplitude: 0.75 V
Lead Channel Pacing Threshold Pulse Width: 0.5 ms
Lead Channel Sensing Intrinsic Amplitude: 11.8 mV
Lead Channel Setting Pacing Amplitude: 2.5 V
Lead Channel Setting Pacing Pulse Width: 0.5 ms
Lead Channel Setting Sensing Sensitivity: 0.5 mV
Pulse Gen Serial Number: 7040910

## 2016-10-15 ENCOUNTER — Encounter: Payer: Self-pay | Admitting: Family Medicine

## 2016-10-15 ENCOUNTER — Ambulatory Visit (INDEPENDENT_AMBULATORY_CARE_PROVIDER_SITE_OTHER): Payer: BLUE CROSS/BLUE SHIELD | Admitting: Family Medicine

## 2016-10-15 VITALS — BP 131/86 | HR 94 | Temp 97.0°F | Ht 69.0 in | Wt 218.0 lb

## 2016-10-15 DIAGNOSIS — Z23 Encounter for immunization: Secondary | ICD-10-CM | POA: Diagnosis not present

## 2016-10-15 DIAGNOSIS — Z7901 Long term (current) use of anticoagulants: Secondary | ICD-10-CM | POA: Diagnosis not present

## 2016-10-15 DIAGNOSIS — I2699 Other pulmonary embolism without acute cor pulmonale: Secondary | ICD-10-CM | POA: Diagnosis not present

## 2016-10-15 DIAGNOSIS — E785 Hyperlipidemia, unspecified: Secondary | ICD-10-CM | POA: Diagnosis not present

## 2016-10-15 DIAGNOSIS — I824Z9 Acute embolism and thrombosis of unspecified deep veins of unspecified distal lower extremity: Secondary | ICD-10-CM | POA: Diagnosis not present

## 2016-10-15 LAB — COAGUCHEK XS/INR WAIVED
INR: 2 — ABNORMAL HIGH (ref 0.9–1.1)
Prothrombin Time: 24.2 s

## 2016-10-15 MED ORDER — TIOTROPIUM BROMIDE MONOHYDRATE 18 MCG IN CAPS: ORAL_CAPSULE | RESPIRATORY_TRACT | 1 refills | Status: DC

## 2016-10-15 NOTE — Progress Notes (Signed)
   Subjective:    Patient ID: Travis Cruz, male    DOB: May 15, 1946, 70 y.o.   MRN: [id number]  HPI 70 year old gentleman who is seen for COPD late effects of CVA and hyperlipidemia. He had his pro time checked today and it is low therapeutic at 2.0.  Patient Active Problem List   Diagnosis Date Noted  . Macular degeneration, dry 12/20/2015  . Cellulitis and abscess 09/27/2015  . COPD (chronic obstructive pulmonary disease) ([place])   . Implantable cardioverter-defibrillator single chamber [employer] [facility] 10/05/2013  . Cancer of upper lobe of lung ([place]) 09/16/2013  . Abnormal chest x-ray 05/13/2013  . COPD exacerbation ([place]) 05/13/2013  . HLD (hyperlipidemia) 05/13/2013  . CAD (coronary atherosclerotic disease) 05/13/2013  . Solitary pulmonary nodule 03/30/2013  . Transient ischemic attack (TIA), and cerebral infarction without residual deficits 03/28/2013  . H/O ventricular fibrillation 03/28/2013  . Ischemic cardiomyopathy 03/15/2013  . NSTEMI (non-ST elevated myocardial infarction) ([place]) 01/27/2013  . Obstructive sleep apnea 03/10/2012  . Patent arterial duct 03/10/2012  . HTN (hypertension) 03/24/2011  . Deep vein thrombosis (DVT) ([place]) 09/23/2008  . PE (pulmonary embolism) 09/23/2008   Outpatient Encounter Prescriptions as of 10/15/2016  Medication Sig  . atorvastatin (LIPITOR) 80 MG tablet TAKE 1 TABLET BY MOUTH THREE TIMES PER WEEK  . levETIRAcetam (KEPPRA) 500 MG tablet TAKE 1 TABLET (500 MG TOTAL) BY MOUTH 2 TIMES DAILY.  . Multiple Vitamin (MULTIVITAMIN) tablet Take 1 tablet by mouth daily.  . Multiple Vitamins-Minerals (PRESERVISION AREDS 2) CAPS Take 1 tablet by mouth daily.   [name] tiotropium (SPIRIVA HANDIHALER) 18 MCG inhalation capsule PLACE 1 CAPSULE (18 MCG TOTAL) INTO INHALER AND INHALE DAILY.  [name] warfarin (COUMADIN) 2 MG tablet TAKE '2MG'$  (=1 TABLET) ON MONDAYS  AND FRIDAYS AND '4MG'$  (= 2 TABLETS)  ALL OTHER DAYS   No facility-administered encounter medications on file as  of 10/15/2016.       Review of Systems  Constitutional: Negative.   Respiratory: Negative.   Cardiovascular: Negative.   Genitourinary: Negative.   Neurological: Negative.        Objective:   Physical Exam  Constitutional: He is oriented to person, place, and time. He appears well-developed and well-nourished.  Cardiovascular: Normal rate and regular rhythm.   Pulmonary/Chest: Effort normal and breath sounds normal.  Neurological: He is alert and oriented to person, place, and time.  Psychiatric: He has a normal mood and affect. His behavior is normal.   BP 131/86   Pulse 94   Temp 97 F (36.1 C) (Oral)   Ht '5\' 9"'$  (1.753 m)   Wt 218 lb (98.9 kg)   BMI 32.19 kg/m         Assessment & Plan:  1. Pulmonary embolism (HCC) Using Spiriva. Noncompliance with respiration - CoaguChek XS/INR Waived  2. Deep vein thrombosis (DVT) of distal vein of lower extremity, unspecified chronicity, unspecified laterality (HCC) No evidence of recurrent disease PT/INR is therapeutic - CoaguChek XS/INR Waived  3. Chronic anticoagulation See above - CoaguChek XS/INR Waived  4. Hyperlipidemia, unspecified hyperlipidemia type Lipids were last assessed one year ago and were at goal - CMP14+EGFR - Lipid panel  Wardell Honour MD

## 2016-10-16 LAB — CMP14+EGFR
ALT: 19 IU/L (ref 0–44)
AST: 22 IU/L (ref 0–40)
Albumin/Globulin Ratio: 1.1 — ABNORMAL LOW (ref 1.2–2.2)
Albumin: 3.9 g/dL (ref 3.6–4.8)
Alkaline Phosphatase: 139 IU/L — ABNORMAL HIGH (ref 39–117)
BUN/Creatinine Ratio: 18 (ref 10–24)
BUN: 15 mg/dL (ref 8–27)
Bilirubin Total: 0.4 mg/dL (ref 0.0–1.2)
CO2: 27 mmol/L (ref 18–29)
Calcium: 9.6 mg/dL (ref 8.6–10.2)
Chloride: 102 mmol/L (ref 96–106)
Creatinine, Ser: 0.82 mg/dL (ref 0.76–1.27)
GFR calc Af Amer: 105 mL/min/{1.73_m2} (ref >59)
GFR calc non Af Amer: 91 mL/min/{1.73_m2} (ref >59)
Globulin, Total: 3.5 g/dL (ref 1.5–4.5)
Glucose: 82 mg/dL (ref 65–99)
Potassium: 5.7 mmol/L — ABNORMAL HIGH (ref 3.5–5.2)
Sodium: 144 mmol/L (ref 134–144)
Total Protein: 7.4 g/dL (ref 6.0–8.5)

## 2016-10-16 LAB — LIPID PANEL
Chol/HDL Ratio: 3.4 ratio units (ref 0.0–5.0)
Cholesterol, Total: 158 mg/dL (ref 100–199)
HDL: 46 mg/dL (ref >39)
LDL Calculated: 89 mg/dL (ref 0–99)
Triglycerides: 117 mg/dL (ref 0–149)
VLDL Cholesterol Cal: 23 mg/dL (ref 5–40)

## 2016-10-28 DIAGNOSIS — G4733 Obstructive sleep apnea (adult) (pediatric): Secondary | ICD-10-CM | POA: Diagnosis not present

## 2016-11-01 ENCOUNTER — Other Ambulatory Visit: Payer: Self-pay | Admitting: Family Medicine

## 2016-11-04 ENCOUNTER — Ambulatory Visit (INDEPENDENT_AMBULATORY_CARE_PROVIDER_SITE_OTHER): Payer: BLUE CROSS/BLUE SHIELD | Admitting: Pharmacist

## 2016-11-04 DIAGNOSIS — I824Z9 Acute embolism and thrombosis of unspecified deep veins of unspecified distal lower extremity: Secondary | ICD-10-CM

## 2016-11-04 DIAGNOSIS — Z7901 Long term (current) use of anticoagulants: Secondary | ICD-10-CM

## 2016-11-04 DIAGNOSIS — I2699 Other pulmonary embolism without acute cor pulmonale: Secondary | ICD-10-CM

## 2016-11-04 LAB — COAGUCHEK XS/INR WAIVED
INR: 2.4 — ABNORMAL HIGH (ref 0.9–1.1)
Prothrombin Time: 28.9 s

## 2016-11-12 ENCOUNTER — Encounter: Payer: Self-pay | Admitting: Pharmacist

## 2016-11-24 DIAGNOSIS — Z95810 Presence of automatic (implantable) cardiac defibrillator: Secondary | ICD-10-CM | POA: Diagnosis not present

## 2016-11-24 DIAGNOSIS — Z8673 Personal history of transient ischemic attack (TIA), and cerebral infarction without residual deficits: Secondary | ICD-10-CM | POA: Diagnosis not present

## 2016-11-24 DIAGNOSIS — R569 Unspecified convulsions: Secondary | ICD-10-CM | POA: Diagnosis not present

## 2016-12-04 ENCOUNTER — Ambulatory Visit (INDEPENDENT_AMBULATORY_CARE_PROVIDER_SITE_OTHER): Payer: BLUE CROSS/BLUE SHIELD | Admitting: Pharmacist

## 2016-12-04 DIAGNOSIS — Z7901 Long term (current) use of anticoagulants: Secondary | ICD-10-CM

## 2016-12-04 DIAGNOSIS — Z86711 Personal history of pulmonary embolism: Secondary | ICD-10-CM | POA: Diagnosis not present

## 2016-12-04 DIAGNOSIS — I2699 Other pulmonary embolism without acute cor pulmonale: Secondary | ICD-10-CM

## 2016-12-04 DIAGNOSIS — I824Z9 Acute embolism and thrombosis of unspecified deep veins of unspecified distal lower extremity: Secondary | ICD-10-CM

## 2016-12-04 DIAGNOSIS — Z86718 Personal history of other venous thrombosis and embolism: Secondary | ICD-10-CM | POA: Diagnosis not present

## 2016-12-04 DIAGNOSIS — Z8673 Personal history of transient ischemic attack (TIA), and cerebral infarction without residual deficits: Secondary | ICD-10-CM | POA: Insufficient documentation

## 2016-12-04 LAB — COAGUCHEK XS/INR WAIVED
INR: 2.1 — ABNORMAL HIGH (ref 0.9–1.1)
Prothrombin Time: 25.8 s

## 2016-12-26 ENCOUNTER — Encounter: Payer: Self-pay | Admitting: Family

## 2016-12-26 ENCOUNTER — Ambulatory Visit (INDEPENDENT_AMBULATORY_CARE_PROVIDER_SITE_OTHER): Payer: BLUE CROSS/BLUE SHIELD | Admitting: Family

## 2016-12-26 VITALS — BP 131/89 | HR 98 | Temp 97.7°F | Ht 69.0 in | Wt 214.2 lb

## 2016-12-26 DIAGNOSIS — L0291 Cutaneous abscess, unspecified: Secondary | ICD-10-CM | POA: Diagnosis not present

## 2016-12-26 MED ORDER — AMOXICILLIN-POT CLAVULANATE 875-125 MG PO TABS: 1.0000 | ORAL_TABLET | Freq: Two times a day (BID) | ORAL | 0 refills | Status: DC

## 2016-12-26 NOTE — Progress Notes (Signed)
   Subjective:    Patient ID: Travis Cruz, male    DOB: 1946/01/10, 71 y.o.   MRN: [id number]  HPI Pt presents to the office today with left leg swelling, warmth, and redness. Pt states he noticed a lump about 3-4 days ago and has become worse. Pt denies any insect bite or injury to the that area. Pt has rested his knee with no relief.   Review of Systems  Respiratory: Positive for cough.   Skin: Positive for wound.  All other systems reviewed and are negative.      Objective:   Physical Exam  Constitutional: He appears well-developed and well-nourished.  Cardiovascular: Normal rate, regular rhythm, normal heart sounds and intact distal pulses.   Pulmonary/Chest: Effort normal. He has rhonchi in the right middle field and the left middle field.  Neurological: He is alert.  Skin: Skin is warm and dry.  Abscess present on left lateral knee appro 2 cm X2cm, erythemas and warm,    Verbal consent given by patient. Local anesthesia Lidocaine 1% with 50m Betadine prep Small incision, sanious fluid removed Cleaned with Saline Antibiotic ointment Dressing applied   BP 131/89   Pulse 98   Temp 97.7 F (36.5 C) (Oral)   Ht '5\' 9"'$  (1.753 m)   Wt 214 lb 3.2 oz (97.2 kg)   BMI 31.63 kg/m      Assessment & Plan:  1. Abscess -Keep clean and dry -Keep elevated  -Report any increase in erythemas, swelling, warmth, or fevers -RTO in 1 week - amoxicillin-clavulanate (AUGMENTIN) 875-125 MG tablet; Take 1 tablet by mouth 2 (two) times daily.  Dispense: 14 tablet; Refill: 0  CEvelina Dun FNP

## 2016-12-26 NOTE — Patient Instructions (Signed)
Skin Abscess A skin abscess is an infected area on or under your skin that contains a collection of pus and other material. An abscess may also be called a furuncle, carbuncle, or boil. An abscess can occur in or on almost any part of your body. Some abscesses break open (rupture) on their own. Most continue to get worse unless they are treated. The infection can spread deeper into the body and eventually into your blood, which can make you feel ill. Treatment usually involves draining the abscess. What are the causes? An abscess occurs when germs, often bacteria, pass through your skin and cause an infection. This may be caused by:  A scrape or cut on your skin.  A puncture wound through your skin, including a needle injection.  Blocked oil or sweat glands.  Blocked and infected hair follicles.  A cyst that forms beneath your skin (sebaceous cyst) and becomes infected. What increases the risk? This condition is more likely to develop in people who:  Have a weak body defense system (immune system).  Have diabetes.  Have dry and irritated skin.  Get frequent injections or use illegal IV drugs.  Have a foreign body in a wound, such as a splinter.  Have problems with their lymph system or veins. What are the signs or symptoms? An abscess may start as a painful, firm bump under the skin. Over time, the abscess may get larger or become softer. Pus may appear at the top of the abscess, causing pressure and pain. It may eventually break through the skin and drain. Other symptoms include:  Redness.  Warmth.  Swelling.  Tenderness.  A sore on the skin. How is this diagnosed? This condition is diagnosed based on your medical history and a physical exam. A sample of pus may be taken from the abscess to find out what is causing the infection and what antibiotics can be used to treat it. You also may have:  Blood tests to look for signs of infection or spread of an infection to your  blood.  Imaging studies such as ultrasound, CT scan, or MRI if the abscess is deep. How is this treated? Small abscesses that drain on their own may not need treatment. Treatment for an abscess that does not rupture on its own may include:  Warm compresses applied to the area several times per day.  Incision and drainage. Your health care provider will make an incision to open the abscess and will remove pus and any foreign body or dead tissue. The incision area may be packed with gauze to keep it open for a few days while it heals.  Antibiotic medicines to treat infection. For a severe abscess, you may first get antibiotics through an IV and then change to oral antibiotics. Follow these instructions at home: Abscess Care   If you have an abscess that has not drained, place a warm, clean, wet washcloth over the abscess several times a day. Do this as told by your health care provider.  Follow instructions from your health care provider about how to take care of your abscess. Make sure you:  Cover the abscess with a bandage (dressing).  Change your dressing or gauze as told by your health care provider.  Wash your hands with soap and water before you change the dressing or gauze. If soap and water are not available, use hand sanitizer.  Check your abscess every day for signs of a worsening infection. Check for:  More redness, swelling, or   pain.  More fluid or blood.  Warmth.  More pus or a bad smell. Medicines   Take over-the-counter and prescription medicines only as told by your health care provider.  If you were prescribed an antibiotic medicine, take it as told by your health care provider. Do not stop taking the antibiotic even if you start to feel better. General instructions   To avoid spreading the infection:  Do not share personal care items, towels, or hot tubs with others.  Avoid making skin contact with other people.  Keep all follow-up visits as told by your  health care provider. This is important. Contact a health care provider if:  You have more redness, swelling, or pain around your abscess.  You have more fluid or blood coming from your abscess.  Your abscess feels warm to the touch.  You have more pus or a bad smell coming from your abscess.  You have a fever.  You have muscle aches.  You have chills or a general ill feeling. Get help right away if:  You have severe pain.  You see red streaks on your skin spreading away from the abscess. This information is not intended to replace advice given to you by your health care provider. Make sure you discuss any questions you have with your health care provider. Document Released: 09/24/2005 Document Revised: 08/10/2016 Document Reviewed: 10/24/2015 Elsevier Interactive Patient Education  2017 [employer] Inc.  

## 2016-12-29 DIAGNOSIS — C3490 Malignant neoplasm of unspecified part of unspecified bronchus or lung: Secondary | ICD-10-CM

## 2016-12-29 HISTORY — DX: Malignant neoplasm of unspecified part of unspecified bronchus or lung: C34.90

## 2017-01-01 ENCOUNTER — Ambulatory Visit (INDEPENDENT_AMBULATORY_CARE_PROVIDER_SITE_OTHER): Payer: BLUE CROSS/BLUE SHIELD | Admitting: *Deleted

## 2017-01-01 DIAGNOSIS — I255 Ischemic cardiomyopathy: Secondary | ICD-10-CM | POA: Diagnosis not present

## 2017-01-01 NOTE — Progress Notes (Signed)
Remote ICD transmission.   

## 2017-01-02 ENCOUNTER — Encounter: Payer: Self-pay | Admitting: Cardiology

## 2017-01-05 ENCOUNTER — Other Ambulatory Visit: Payer: Self-pay | Admitting: Family Medicine

## 2017-01-07 ENCOUNTER — Ambulatory Visit (INDEPENDENT_AMBULATORY_CARE_PROVIDER_SITE_OTHER): Payer: BLUE CROSS/BLUE SHIELD | Admitting: Pharmacist

## 2017-01-07 DIAGNOSIS — I824Z9 Acute embolism and thrombosis of unspecified deep veins of unspecified distal lower extremity: Secondary | ICD-10-CM

## 2017-01-07 DIAGNOSIS — Z86711 Personal history of pulmonary embolism: Secondary | ICD-10-CM | POA: Diagnosis not present

## 2017-01-07 DIAGNOSIS — Z8673 Personal history of transient ischemic attack (TIA), and cerebral infarction without residual deficits: Secondary | ICD-10-CM | POA: Diagnosis not present

## 2017-01-07 DIAGNOSIS — Z86718 Personal history of other venous thrombosis and embolism: Secondary | ICD-10-CM | POA: Diagnosis not present

## 2017-01-07 DIAGNOSIS — I2699 Other pulmonary embolism without acute cor pulmonale: Secondary | ICD-10-CM

## 2017-01-07 DIAGNOSIS — Z7901 Long term (current) use of anticoagulants: Secondary | ICD-10-CM

## 2017-01-07 LAB — COAGUCHEK XS/INR WAIVED
INR: 1.8 — ABNORMAL HIGH (ref 0.9–1.1)
Prothrombin Time: 21.9 s

## 2017-01-14 ENCOUNTER — Other Ambulatory Visit: Payer: Self-pay | Admitting: Radiation Oncology

## 2017-01-14 ENCOUNTER — Ambulatory Visit
Admission: RE | Admit: 2017-01-14 | Discharge: 2017-01-14 | Disposition: A | Discharge status: Home / Self Care | Payer: BLUE CROSS/BLUE SHIELD | Source: Ambulatory Visit | Attending: Radiation Oncology | Admitting: Radiation Oncology

## 2017-01-14 ENCOUNTER — Ambulatory Visit (HOSPITAL_COMMUNITY)
Admission: RE | Admit: 2017-01-14 | Discharge: 2017-01-14 | Disposition: A | Discharge status: Home / Self Care | Payer: BLUE CROSS/BLUE SHIELD | Source: Ambulatory Visit | Attending: Radiation Oncology | Admitting: Radiation Oncology

## 2017-01-14 DIAGNOSIS — C3412 Malignant neoplasm of upper lobe, left bronchus or lung: Secondary | ICD-10-CM

## 2017-01-14 DIAGNOSIS — I712 Thoracic aortic aneurysm, without rupture: Secondary | ICD-10-CM | POA: Diagnosis not present

## 2017-01-14 DIAGNOSIS — I2510 Atherosclerotic heart disease of native coronary artery without angina pectoris: Secondary | ICD-10-CM | POA: Diagnosis not present

## 2017-01-14 DIAGNOSIS — K8020 Calculus of gallbladder without cholecystitis without obstruction: Secondary | ICD-10-CM | POA: Insufficient documentation

## 2017-01-14 DIAGNOSIS — J439 Emphysema, unspecified: Secondary | ICD-10-CM | POA: Insufficient documentation

## 2017-01-14 DIAGNOSIS — I700 Atherosclerosis of aorta: Secondary | ICD-10-CM | POA: Diagnosis not present

## 2017-01-14 LAB — BASIC METABOLIC PANEL
Anion Gap: 10 mEq/L (ref 3–11)
BUN: 13.1 mg/dL (ref 7.0–26.0)
CO2: 25 mEq/L (ref 22–29)
Calcium: 9.9 mg/dL (ref 8.4–10.4)
Chloride: 107 mEq/L (ref 98–109)
Creatinine: 0.9 mg/dL (ref 0.7–1.3)
EGFR: 88 mL/min/{1.73_m2} — ABNORMAL LOW (ref >90)
Glucose: 102 mg/dl (ref 70–140)
Potassium: 4.4 mEq/L (ref 3.5–5.1)
Sodium: 141 mEq/L (ref 136–145)

## 2017-01-15 ENCOUNTER — Inpatient Hospital Stay: Admission: RE | Admit: 2017-01-15 | Payer: BLUE CROSS/BLUE SHIELD | Source: Ambulatory Visit

## 2017-01-15 ENCOUNTER — Ambulatory Visit: Payer: BLUE CROSS/BLUE SHIELD

## 2017-01-19 ENCOUNTER — Other Ambulatory Visit: Payer: Self-pay | Admitting: Pediatrics

## 2017-01-19 LAB — CUP PACEART REMOTE DEVICE CHECK
Battery Remaining Longevity: 65 mo
Battery Remaining Percentage: 65 %
Battery Voltage: 2.96 V
Brady Statistic RV Percent Paced: 0 %
Date Time Interrogation Session: 20180104070015
HighPow Impedance: 88 Ohm
HighPow Impedance: 88 Ohm
Implantable Lead Implant Date: 20140205
Implantable Lead Location: 753860
Implantable Lead Model: 181
Implantable Lead Serial Number: 323859
Implantable Pulse Generator Implant Date: 20140205
Lead Channel Impedance Value: 450 Ohm
Lead Channel Pacing Threshold Amplitude: 0.75 V
Lead Channel Pacing Threshold Pulse Width: 0.5 ms
Lead Channel Sensing Intrinsic Amplitude: 11.8 mV
Lead Channel Setting Pacing Amplitude: 2.5 V
Lead Channel Setting Pacing Pulse Width: 0.5 ms
Lead Channel Setting Sensing Sensitivity: 0.5 mV
Pulse Gen Serial Number: 7040910

## 2017-01-19 MED ORDER — OSELTAMIVIR PHOSPHATE 75 MG PO CAPS: 75.0000 mg | ORAL_CAPSULE | Freq: Every day | ORAL | 0 refills | Status: DC

## 2017-01-19 NOTE — Progress Notes (Signed)
Wife tested positive for flu A, sending in ppx dosing tamiflu per wife's request.

## 2017-01-20 ENCOUNTER — Telehealth: Payer: Self-pay | Admitting: Adult Health

## 2017-01-20 NOTE — Telephone Encounter (Signed)
Pt caregiver called to r/s pt 1/24 appt to next week due to her being sick. Gave caregiver new appt date/time 1/31 at 10 am

## 2017-01-21 ENCOUNTER — Ambulatory Visit: Payer: BLUE CROSS/BLUE SHIELD | Admitting: Adult Health

## 2017-01-28 ENCOUNTER — Encounter: Payer: Self-pay | Admitting: Adult Health

## 2017-01-28 ENCOUNTER — Ambulatory Visit (HOSPITAL_BASED_OUTPATIENT_CLINIC_OR_DEPARTMENT_OTHER): Payer: BLUE CROSS/BLUE SHIELD | Admitting: Adult Health

## 2017-01-28 VITALS — BP 137/82 | HR 99 | Temp 97.8°F | Resp 20 | Ht 69.0 in | Wt 218.8 lb

## 2017-01-28 DIAGNOSIS — Z85118 Personal history of other malignant neoplasm of bronchus and lung: Secondary | ICD-10-CM | POA: Diagnosis not present

## 2017-01-28 DIAGNOSIS — R911 Solitary pulmonary nodule: Secondary | ICD-10-CM

## 2017-01-28 DIAGNOSIS — C3412 Malignant neoplasm of upper lobe, left bronchus or lung: Secondary | ICD-10-CM
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
## 2017-02-05 ENCOUNTER — Other Ambulatory Visit: Payer: Self-pay

## 2017-02-05 MED ORDER — WARFARIN SODIUM 2 MG PO TABS: ORAL_TABLET | ORAL | 0 refills | Status: DC

## 2017-02-10 ENCOUNTER — Ambulatory Visit (HOSPITAL_COMMUNITY)
Admission: RE | Admit: 2017-02-10 | Discharge: 2017-02-10 | Disposition: A | Discharge status: Home / Self Care | Payer: BLUE CROSS/BLUE SHIELD | Source: Ambulatory Visit | Attending: Adult Health | Admitting: Adult Health

## 2017-02-10 DIAGNOSIS — R911 Solitary pulmonary nodule: Secondary | ICD-10-CM

## 2017-02-10 DIAGNOSIS — C3490 Malignant neoplasm of unspecified part of unspecified bronchus or lung: Secondary | ICD-10-CM | POA: Diagnosis not present

## 2017-02-10 DIAGNOSIS — C3412 Malignant neoplasm of upper lobe, left bronchus or lung: Secondary | ICD-10-CM

## 2017-02-10 LAB — GLUCOSE, CAPILLARY: Glucose-Capillary: 104 mg/dL — ABNORMAL HIGH (ref 65–99)

## 2017-02-10 MED ORDER — FLUDEOXYGLUCOSE F - 18 (FDG) INJECTION
10.8000 | Freq: Once | INTRAVENOUS | Status: AC | PRN
  Administered 2017-02-10: 10.8 via INTRAVENOUS

## 2017-02-11 ENCOUNTER — Telehealth: Payer: Self-pay | Admitting: Adult Health

## 2017-02-11 ENCOUNTER — Ambulatory Visit (INDEPENDENT_AMBULATORY_CARE_PROVIDER_SITE_OTHER): Payer: BLUE CROSS/BLUE SHIELD | Admitting: Pharmacist

## 2017-02-11 DIAGNOSIS — Z86718 Personal history of other venous thrombosis and embolism: Secondary | ICD-10-CM

## 2017-02-11 DIAGNOSIS — Z8673 Personal history of transient ischemic attack (TIA), and cerebral infarction without residual deficits: Secondary | ICD-10-CM

## 2017-02-11 DIAGNOSIS — Z7901 Long term (current) use of anticoagulants: Secondary | ICD-10-CM | POA: Diagnosis not present

## 2017-02-11 DIAGNOSIS — I2699 Other pulmonary embolism without acute cor pulmonale: Secondary | ICD-10-CM

## 2017-02-11 DIAGNOSIS — Z86711 Personal history of pulmonary embolism: Secondary | ICD-10-CM

## 2017-02-11 DIAGNOSIS — I824Z9 Acute embolism and thrombosis of unspecified deep veins of unspecified distal lower extremity: Secondary | ICD-10-CM

## 2017-02-11 LAB — COAGUCHEK XS/INR WAIVED
INR: 3 — ABNORMAL HIGH (ref 0.9–1.1)
Prothrombin Time: 35.8 s

## 2017-02-11 MED ORDER — WARFARIN SODIUM 2 MG PO TABS: ORAL_TABLET | ORAL | 1 refills | Status: DC

## 2017-02-11 NOTE — Telephone Encounter (Signed)
I called patient concerning PET scan results.  Left a message on the voice mail for them to return my call today.  I forwarded PET scan results to Dr. Servando Snare, the patient's CT surgeon as well.

## 2017-02-17 ENCOUNTER — Encounter: Payer: Self-pay | Admitting: Family Medicine

## 2017-02-17 ENCOUNTER — Ambulatory Visit (INDEPENDENT_AMBULATORY_CARE_PROVIDER_SITE_OTHER): Payer: BLUE CROSS/BLUE SHIELD | Admitting: Family Medicine

## 2017-02-17 ENCOUNTER — Ambulatory Visit (INDEPENDENT_AMBULATORY_CARE_PROVIDER_SITE_OTHER): Payer: BLUE CROSS/BLUE SHIELD

## 2017-02-17 VITALS — BP 127/88 | HR 104 | Temp 97.2°F | Ht 69.0 in | Wt 217.0 lb

## 2017-02-17 DIAGNOSIS — R05 Cough: Secondary | ICD-10-CM

## 2017-02-17 DIAGNOSIS — R059 Cough, unspecified: Secondary | ICD-10-CM

## 2017-02-17 MED ORDER — AZITHROMYCIN 250 MG PO TABS: ORAL_TABLET | ORAL | 0 refills | Status: DC

## 2017-02-17 MED ORDER — HYDROCODONE-HOMATROPINE 5-1.5 MG/5ML PO SYRP: 5.0000 mL | ORAL_SOLUTION | Freq: Three times a day (TID) | ORAL | 0 refills | Status: DC | PRN

## 2017-02-17 NOTE — Progress Notes (Signed)
   Subjective:    Patient ID: Travis Cruz, male    DOB: May 20, 1946, 71 y.o.   MRN: [id number]  HPI  Patient here today for cough, congestion and slight fever that started about 2 days ago. Cough is mostly dry he denies headache sore throat or myalgias. He took a course of Tamiflu several weeks ago when his wife was diagnosed with flu. He does have a history of lung cancer.    Patient Active Problem List   Diagnosis Date Noted  . History of DVT (deep vein thrombosis) 12/04/2016  . History of pulmonary embolus (PE) 12/04/2016  . History of CVA (cerebrovascular accident) 12/04/2016  . History of TIA (transient ischemic attack) 12/04/2016  . Macular degeneration, dry 12/20/2015  . Cellulitis and abscess 09/27/2015  . COPD (chronic obstructive pulmonary disease) ([place])   . Implantable cardioverter-defibrillator single chamber [employer] 10/05/2013  . Malignant neoplasm of upper lobe of left lung ([place]) 09/16/2013  . Abnormal chest x-ray 05/13/2013  . COPD exacerbation ([place]) 05/13/2013  . HLD (hyperlipidemia) 05/13/2013  . CAD (coronary atherosclerotic disease) 05/13/2013  . Solitary pulmonary nodule 03/30/2013  . Transient ischemic attack (TIA), and cerebral infarction without residual deficits 03/28/2013  . H/O ventricular fibrillation 03/28/2013  . Ischemic cardiomyopathy 03/15/2013  . NSTEMI (non-ST elevated myocardial infarction) ([place]) 01/27/2013  . Obstructive sleep apnea 03/10/2012  . Patent arterial duct 03/10/2012  . HTN (hypertension) 03/24/2011  . Deep vein thrombosis (DVT) ([place]) 09/23/2008  . Pulmonary embolism ([place]) 09/23/2008   Outpatient Encounter Prescriptions as of 02/17/2017  Medication Sig  . atorvastatin (LIPITOR) 80 MG tablet TAKE 1 TABLET BY MOUTH THREE TIMES PER WEEK  . levETIRAcetam (KEPPRA) 500 MG tablet TAKE 1 TABLET (500 MG TOTAL) BY MOUTH 2 TIMES DAILY.  . Multiple Vitamin (MULTIVITAMIN) tablet Take 1 tablet by mouth daily.  . Multiple Vitamins-Minerals  (PRESERVISION AREDS 2) CAPS Take 2 tablets by mouth daily.   [name] SPIRIVA HANDIHALER 18 MCG inhalation capsule PLACE 1 CAPSULE (18 MCG TOTAL) INTO INHALER AND INHALE DAILY.  [name] warfarin (COUMADIN) 2 MG tablet TAKE '2MG'$  (=1 TABLET) ON MONDAYS  AND FRIDAYS AND '4MG'$  (= 2 TABLETS)  ALL OTHER DAYS   No facility-administered encounter medications on file as of 02/17/2017.      Review of Systems  Constitutional: Positive for fever.  HENT: Positive for congestion.   Eyes: Negative.   Respiratory: Positive for cough.   Cardiovascular: Negative.   Gastrointestinal: Negative.   Endocrine: Negative.   Genitourinary: Negative.   Musculoskeletal: Negative.   Skin: Negative.   Allergic/Immunologic: Negative.   Neurological: Negative.   Hematological: Negative.   Psychiatric/Behavioral: Negative.        Objective:   Physical Exam  Constitutional: He appears well-developed and well-nourished.  HENT:  Mouth/Throat: Oropharynx is clear and moist.  Cardiovascular: Normal rate, regular rhythm and normal heart sounds.   Pulmonary/Chest: Effort normal.   BP 127/88 (BP Location: Left Arm)   Pulse (!) 104   Temp 97.2 F (36.2 C) (Oral)   Ht '5\' 9"'$  (1.753 m)   Wt 217 lb (98.4 kg)   BMI 32.05 kg/m    Chest x-ray does not show any change from previous study     Assessment & Plan:  1. Cough Will treat as if bronchitis. Z-Pak. Hycodan to take at bedtime or as needed for cough. If symptoms do not resolve in 1-2 weeks return  Wardell Honour MD - DG Chest 2 View; Future

## 2017-02-19 ENCOUNTER — Encounter: Payer: Self-pay | Admitting: Radiation Oncology

## 2017-02-19 NOTE — Progress Notes (Signed)
Patient discussed in [place] today.  He had SBRT previously to left upper lung in 10/2013.  On follow-up, he has a new slowly enlarging 14 mm RLL nodule with an SUV of 4.8 suggestive of malignancy.  The tumor location in the esophageal recess was felt to be a high risk location for any biopsy (CT biopsy, EUS, EBUS, or EUS-esoph) on review with IR, CT surgery, Pulmonary team.  So, we need to bring him in for follow-up to discuss SBRT without biopsy.
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
## 2017-02-25 ENCOUNTER — Encounter: Payer: Self-pay | Admitting: Radiation Oncology

## 2017-02-25 ENCOUNTER — Ambulatory Visit
Admission: RE | Admit: 2017-02-25 | Discharge: 2017-02-25 | Disposition: A | Discharge status: Home / Self Care | Payer: BLUE CROSS/BLUE SHIELD | Source: Ambulatory Visit | Attending: Radiation Oncology | Admitting: Radiation Oncology

## 2017-02-25 ENCOUNTER — Ambulatory Visit
Admission: RE | Admit: 2017-02-25 | Discharge: 2017-02-25 | Disposition: A | Discharge status: Home / Self Care | Payer: BC Managed Care – PPO | Source: Ambulatory Visit | Attending: Radiation Oncology | Admitting: Radiation Oncology

## 2017-02-25 VITALS — BP 123/83 | HR 102 | Temp 97.9°F | Resp 20 | Ht 69.0 in | Wt 217.4 lb

## 2017-02-25 DIAGNOSIS — Z85118 Personal history of other malignant neoplasm of bronchus and lung: Secondary | ICD-10-CM | POA: Diagnosis not present

## 2017-02-25 DIAGNOSIS — C3431 Malignant neoplasm of lower lobe, right bronchus or lung: Secondary | ICD-10-CM | POA: Insufficient documentation

## 2017-02-25 DIAGNOSIS — Z950 Presence of cardiac pacemaker: Secondary | ICD-10-CM | POA: Diagnosis not present

## 2017-02-25 DIAGNOSIS — Z923 Personal history of irradiation: Secondary | ICD-10-CM | POA: Diagnosis not present

## 2017-02-25 DIAGNOSIS — Z510 Encounter for antineoplastic radiation therapy: Secondary | ICD-10-CM | POA: Diagnosis not present

## 2017-02-25 DIAGNOSIS — D381 Neoplasm of uncertain behavior of trachea, bronchus and lung: Secondary | ICD-10-CM
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
## 2017-02-25 NOTE — Progress Notes (Signed)
See progress note under physician encounter. 

## 2017-02-26 DIAGNOSIS — G4733 Obstructive sleep apnea (adult) (pediatric): Secondary | ICD-10-CM | POA: Diagnosis not present

## 2017-02-27 ENCOUNTER — Encounter: Payer: Self-pay | Admitting: Radiation Oncology

## 2017-02-27 NOTE — Progress Notes (Signed)
Faxed PACEMAKER/ICD FORM to the device clinic at [phone]. Fax confirmation of delivery obtained.

## 2017-03-04 ENCOUNTER — Ambulatory Visit
Admission: RE | Admit: 2017-03-04 | Discharge: 2017-03-04 | Disposition: A | Discharge status: Home / Self Care | Payer: BC Managed Care – PPO | Source: Ambulatory Visit | Attending: Radiation Oncology | Admitting: Radiation Oncology

## 2017-03-04 ENCOUNTER — Telehealth: Payer: Self-pay | Admitting: Internal Medicine

## 2017-03-04 ENCOUNTER — Telehealth: Payer: Self-pay | Admitting: Radiation Oncology

## 2017-03-04 DIAGNOSIS — C3431 Malignant neoplasm of lower lobe, right bronchus or lung: Secondary | ICD-10-CM

## 2017-03-04 DIAGNOSIS — Z510 Encounter for antineoplastic radiation therapy: Secondary | ICD-10-CM | POA: Diagnosis not present

## 2017-03-04 NOTE — Progress Notes (Signed)
  Radiation Oncology         [phone] ________________________________  Name: Travis Cruz MRN: [id number]  Date: 03/04/2017  DOB: 1946/12/02  STEREOTACTIC BODY RADIOTHERAPY SIMULATION AND TREATMENT PLANNING NOTE    ICD-9-CM ICD-10-CM   1. Cancer of bronchus of right lower lobe (HCC) 162.5 C34.31     DIAGNOSIS:  71 yo man with a 14 mm putative non-small cell carcinoma of the right lower lung - Clinical stage IA  NARRATIVE:  The patient was brought to the [facility].  Identity was confirmed.  All relevant records and images related to the planned course of therapy were reviewed.  The patient freely provided informed written consent to proceed with treatment after reviewing the details related to the planned course of therapy. The consent form was witnessed and verified by the simulation staff.  Then, the patient was set-up in a stable reproducible  supine position for radiation therapy.  A BodyFix immobilization pillow was fabricated for reproducible positioning.  Then I personally applied the abdominal compression paddle to limit respiratory excursion.  4D respiratoy motion management CT images were obtained.  Surface markings were placed.  The CT images were loaded into the planning software.  Then, using Cine, MIP, and standard views, the internal target volume (ITV) and planning target volumes (PTV) were delinieated, and avoidance structures were contoured.  Treatment planning then occurred.  The radiation prescription was entered and confirmed.  A total of two complex treatment devices were fabricated in the form of the BodyFix immobilization pillow and a neck accuform cushion.  I have requested : 3D Simulation  I have requested a DVH of the following structures: Heart, Lungs, Esophagus, Chest Wall, Brachial Plexus, Major Blood Vessels, and targets.  SPECIAL TREATMENT PROCEDURE:  The planned course of therapy using radiation constitutes a special treatment procedure.  Special care is required in the management of this patient for the following reasons. This treatment constitutes a Special Treatment Procedure for the following reason: [ High dose per fraction requiring special monitoring for increased toxicities of treatment including daily imaging..  The special nature of the planned course of radiotherapy will require increased physician supervision and oversight to ensure patient's safety with optimal treatment outcomes.  RESPIRATORY MOTION MANAGEMENT SIMULATION:  In order to account for effect of respiratory motion on target structures and other organs in the planning and delivery of radiotherapy, this patient underwent respiratory motion management simulation.  To accomplish this, when the patient was brought to the CT simulation planning suite, 4D respiratoy motion management CT images were obtained.  The CT images were loaded into the planning software.  Then, using a variety of tools including Cine, MIP, and standard views, the target volume and planning target volumes (PTV) were delineated.  Avoidance structures were contoured.  Treatment planning then occurred.  Dose volume histograms were generated and reviewed for each of the requested structure.  The resulting plan was carefully reviewed and approved today.  PLAN:  The patient will receive 50 Gy in 5 fractions.  ________________________________  Sheral Apley Tammi Klippel, M.D.  This document serves as a record of services personally performed by Tyler Pita, MD. It was created on his behalf by Bethann Humble, a trained medical scribe. The creation of this record is based on the scribe's personal observations and the provider's statements to them. This document has been checked and approved by the attending provider.

## 2017-03-04 NOTE — Telephone Encounter (Signed)
Phoned to inquire about status of pacemaker form that was faxed on 02/27/17. Line busy. Secretary will have someone in the device clinic call this RN back.

## 2017-03-04 NOTE — Telephone Encounter (Signed)
Informed Travis Cruz that Dr.Klein has been out of the office. I told her that he will be back tomorrow and form will be signed then.  Travis Cruz voiced understanding.

## 2017-03-04 NOTE — Telephone Encounter (Signed)
New Message     Did you receive fax , what is the status so pt can start radiation treatment

## 2017-03-04 NOTE — Telephone Encounter (Signed)
Received call back from [place] of the device clinic. She reports that Dr. Caryl Comes will be back in the office tomorrow afternoon. The intent is to have him sign the form tomorrow and fax it back to [place].

## 2017-03-11 ENCOUNTER — Ambulatory Visit (INDEPENDENT_AMBULATORY_CARE_PROVIDER_SITE_OTHER): Payer: BLUE CROSS/BLUE SHIELD | Admitting: Pharmacist

## 2017-03-11 DIAGNOSIS — Z86711 Personal history of pulmonary embolism: Secondary | ICD-10-CM | POA: Diagnosis not present

## 2017-03-11 DIAGNOSIS — Z86718 Personal history of other venous thrombosis and embolism: Secondary | ICD-10-CM | POA: Diagnosis not present

## 2017-03-11 DIAGNOSIS — Z8673 Personal history of transient ischemic attack (TIA), and cerebral infarction without residual deficits: Secondary | ICD-10-CM | POA: Diagnosis not present

## 2017-03-11 LAB — COAGUCHEK XS/INR WAIVED
INR: 2.1 — ABNORMAL HIGH (ref 0.9–1.1)
Prothrombin Time: 25.7 s

## 2017-03-13 ENCOUNTER — Telehealth: Payer: Self-pay | Admitting: Radiation Oncology

## 2017-03-13 NOTE — Telephone Encounter (Signed)
Phoned Aaron Edelman and requested a rep be present to monitor patient's pacemaker on initial treatment day (3/26 at 1100. Aaron Edelman confirmed a rep would be present.

## 2017-03-16 DIAGNOSIS — C3431 Malignant neoplasm of lower lobe, right bronchus or lung: Secondary | ICD-10-CM | POA: Diagnosis not present

## 2017-03-16 DIAGNOSIS — Z510 Encounter for antineoplastic radiation therapy: Secondary | ICD-10-CM | POA: Diagnosis not present

## 2017-03-23 ENCOUNTER — Ambulatory Visit
Admission: RE | Admit: 2017-03-23 | Discharge: 2017-03-23 | Disposition: A | Discharge status: Home / Self Care | Payer: BC Managed Care – PPO | Source: Ambulatory Visit | Attending: Radiation Oncology | Admitting: Radiation Oncology

## 2017-03-23 DIAGNOSIS — C3431 Malignant neoplasm of lower lobe, right bronchus or lung: Secondary | ICD-10-CM | POA: Diagnosis not present

## 2017-03-23 DIAGNOSIS — Z510 Encounter for antineoplastic radiation therapy: Secondary | ICD-10-CM | POA: Diagnosis not present

## 2017-03-24 ENCOUNTER — Ambulatory Visit: Payer: BC Managed Care – PPO | Admitting: Radiation Oncology

## 2017-03-25 ENCOUNTER — Ambulatory Visit
Admission: RE | Admit: 2017-03-25 | Discharge: 2017-03-25 | Disposition: A | Discharge status: Home / Self Care | Payer: BC Managed Care – PPO | Source: Ambulatory Visit | Attending: Radiation Oncology | Admitting: Radiation Oncology

## 2017-03-25 DIAGNOSIS — Z510 Encounter for antineoplastic radiation therapy: Secondary | ICD-10-CM | POA: Diagnosis not present

## 2017-03-25 DIAGNOSIS — C3431 Malignant neoplasm of lower lobe, right bronchus or lung: Secondary | ICD-10-CM | POA: Diagnosis not present

## 2017-03-26 ENCOUNTER — Ambulatory Visit: Payer: BC Managed Care – PPO | Admitting: Radiation Oncology

## 2017-03-27 ENCOUNTER — Ambulatory Visit
Admission: RE | Admit: 2017-03-27 | Discharge: 2017-03-27 | Disposition: A | Discharge status: Home / Self Care | Payer: BC Managed Care – PPO | Source: Ambulatory Visit | Attending: Radiation Oncology | Admitting: Radiation Oncology

## 2017-03-27 DIAGNOSIS — C3431 Malignant neoplasm of lower lobe, right bronchus or lung: Secondary | ICD-10-CM | POA: Diagnosis not present

## 2017-03-27 DIAGNOSIS — Z510 Encounter for antineoplastic radiation therapy: Secondary | ICD-10-CM | POA: Diagnosis not present

## 2017-03-31 ENCOUNTER — Ambulatory Visit
Admission: RE | Admit: 2017-03-31 | Discharge: 2017-03-31 | Disposition: A | Discharge status: Home / Self Care | Payer: BC Managed Care – PPO | Source: Ambulatory Visit | Attending: Radiation Oncology | Admitting: Radiation Oncology

## 2017-03-31 DIAGNOSIS — C3431 Malignant neoplasm of lower lobe, right bronchus or lung: Secondary | ICD-10-CM | POA: Diagnosis not present

## 2017-03-31 DIAGNOSIS — Z510 Encounter for antineoplastic radiation therapy: Secondary | ICD-10-CM | POA: Diagnosis not present

## 2017-04-02 ENCOUNTER — Ambulatory Visit
Admission: RE | Admit: 2017-04-02 | Discharge: 2017-04-02 | Disposition: A | Discharge status: Home / Self Care | Payer: BLUE CROSS/BLUE SHIELD | Source: Ambulatory Visit | Attending: Radiation Oncology | Admitting: Radiation Oncology

## 2017-04-02 ENCOUNTER — Ambulatory Visit
Admission: RE | Admit: 2017-04-02 | Discharge: 2017-04-02 | Disposition: A | Discharge status: Home / Self Care | Payer: BC Managed Care – PPO | Source: Ambulatory Visit | Attending: Radiation Oncology | Admitting: Radiation Oncology

## 2017-04-02 ENCOUNTER — Ambulatory Visit (INDEPENDENT_AMBULATORY_CARE_PROVIDER_SITE_OTHER): Payer: BLUE CROSS/BLUE SHIELD | Admitting: *Deleted

## 2017-04-02 ENCOUNTER — Encounter: Payer: Self-pay | Admitting: Radiation Oncology

## 2017-04-02 VITALS — BP 141/87 | HR 103 | Temp 97.6°F | Resp 18 | Wt 220.4 lb

## 2017-04-02 DIAGNOSIS — C3431 Malignant neoplasm of lower lobe, right bronchus or lung: Secondary | ICD-10-CM | POA: Diagnosis not present

## 2017-04-02 DIAGNOSIS — Z510 Encounter for antineoplastic radiation therapy: Secondary | ICD-10-CM | POA: Diagnosis not present

## 2017-04-02 DIAGNOSIS — I255 Ischemic cardiomyopathy: Secondary | ICD-10-CM | POA: Diagnosis not present

## 2017-04-02 LAB — CUP PACEART REMOTE DEVICE CHECK
Battery Remaining Longevity: 62 mo
Battery Remaining Percentage: 62 %
Battery Voltage: 2.96 V
Brady Statistic RV Percent Paced: 0 %
Date Time Interrogation Session: 20180405060016
HighPow Impedance: 90 Ohm
HighPow Impedance: 90 Ohm
Implantable Lead Implant Date: 20140205
Implantable Lead Location: 753860
Implantable Lead Model: 181
Implantable Lead Serial Number: 323859
Implantable Pulse Generator Implant Date: 20140205
Lead Channel Impedance Value: 480 Ohm
Lead Channel Pacing Threshold Amplitude: 0.75 V
Lead Channel Pacing Threshold Pulse Width: 0.5 ms
Lead Channel Sensing Intrinsic Amplitude: 11.8 mV
Lead Channel Setting Pacing Amplitude: 2.5 V
Lead Channel Setting Pacing Pulse Width: 0.5 ms
Lead Channel Setting Sensing Sensitivity: 0.5 mV
Pulse Gen Serial Number: 7040910

## 2017-04-02 NOTE — Progress Notes (Signed)
Remote ICD transmission.   

## 2017-04-02 NOTE — Progress Notes (Signed)
The patient completed his radiotherapy today. He is feeling well without complaints. He will return in 1 month's time. He does have an elevated heart rate of 103 which is typical for him. He denies any chest pain, shortness of breath, fevers, chills, or dysphagia or nausea.   Wt Readings from Last 3 Encounters:  04/02/17 220 lb 6.4 oz (100 kg)  02/25/17 217 lb 6.4 oz (98.6 kg)  02/17/17 217 lb (98.4 kg)   Temp Readings from Last 3 Encounters:  04/02/17 97.6 F (36.4 C) (Oral)  02/25/17 97.9 F (36.6 C) (Oral)  02/17/17 97.2 F (36.2 C) (Oral)   BP Readings from Last 3 Encounters:  04/02/17 (!) 141/87  02/25/17 123/83  02/17/17 127/88   Pulse Readings from Last 3 Encounters:  04/02/17 (!) 103  02/25/17 (!) 102  02/17/17 (!) 104   In general this is a well appearing caucasian male in no acute distress. He is alert and oriented x4 and appropriate throughout the examination. HEENT reveals that the patient is normocephalic, atraumatic. EOMs are intact. PERRLA. Skin is intact without any evidence of gross lesions. Cardiovascular exam reveals a regular rate and rhythm, no clicks rubs or murmurs are auscultated. Chest is clear to auscultation bilaterally.   Impression/Plan: 1. Putative Stage IA, NSCLC of the right lower lobe. The patient has tolerated his radiotherapy well. He will return in 1 month's time for evaluation and to outline his follow up schedule for serial imaging. He was given precautions on when to call with questions.     Carola Rhine, PAC

## 2017-04-02 NOTE — Progress Notes (Signed)
Heart rate elevated. Weight stable. Denies pain. Denies cough, shortness of breath, or difficulty associated with swallowing. Denies change of energy level from the start of sbrt. One month follow up appointment card given. Encouraged patient to contact this RN with future needs. Patient and wife verbalized understanding.   BP (!) 141/87 (BP Location: Left Arm, Patient Position: Sitting, Cuff Size: Normal)   Pulse (!) 103   Temp 97.6 F (36.4 C) (Oral)   Resp 18   Wt 220 lb 6.4 oz (100 kg)   SpO2 92%   BMI 32.55 kg/m  Wt Readings from Last 3 Encounters:  04/02/17 220 lb 6.4 oz (100 kg)  02/25/17 217 lb 6.4 oz (98.6 kg)  02/17/17 217 lb (98.4 kg)

## 2017-04-03 ENCOUNTER — Encounter: Payer: Self-pay | Admitting: Cardiology

## 2017-04-06 ENCOUNTER — Encounter: Payer: Self-pay | Admitting: Family Medicine

## 2017-04-08 NOTE — Progress Notes (Signed)
  Radiation Oncology         [phone]) [phone] ________________________________  Name: Travis Cruz MRN: [id number]  Date: 04/02/2017  DOB: [birth year]  End of Treatment Note  Diagnosis:   71 yo man with a 14 mm putative non-small cell carcinoma of the right lower lung - Clinical stage IA     Indication for treatment:  Curative, Definitive SBRT       Radiation treatment dates:   03/23/2017 to 04/02/2017  Site/dose:   The target was treated to 50 Gy in 5 fractions of 10 Gy  Beams/energy:   The patient was treated using stereotactic body radiotherapy according to a 3D conformal radiotherapy plan.  Volumetric arc fields were employed to deliver 6 MV X-rays.  Image guidance was performed with per fraction cone beam CT prior to treatment under personal MD supervision.  Immobilization was achieved using BodyFix Pillow.  Narrative: The patient tolerated radiation treatment relatively well. He is feeling well without complaints. He denies any chest pain, shortness of breath, fevers, chills, dysphagia or nausea.   Plan: The patient has completed radiation treatment. The patient will return to radiation oncology clinic for routine followup in one month. I advised them to call or return sooner if they have any questions or concerns related to their recovery or treatment. ________________________________  Sheral Apley. Tammi Klippel, M.D.  This document serves as a record of services personally performed by Tyler Pita, MD. It was created on his behalf by Arlyce Harman, a trained medical scribe. The creation of this record is based on the scribe's personal observations and the provider's statements to them. This document has been checked and approved by the attending provider.

## 2017-04-14 ENCOUNTER — Encounter: Payer: Self-pay | Admitting: Family Medicine

## 2017-04-14 ENCOUNTER — Ambulatory Visit (INDEPENDENT_AMBULATORY_CARE_PROVIDER_SITE_OTHER): Payer: BLUE CROSS/BLUE SHIELD | Admitting: Family Medicine

## 2017-04-14 VITALS — BP 115/77 | HR 106 | Temp 97.2°F | Ht 69.0 in | Wt 221.0 lb

## 2017-04-14 DIAGNOSIS — C3431 Malignant neoplasm of lower lobe, right bronchus or lung: Secondary | ICD-10-CM

## 2017-04-14 DIAGNOSIS — J449 Chronic obstructive pulmonary disease, unspecified: Secondary | ICD-10-CM

## 2017-04-14 DIAGNOSIS — Z8673 Personal history of transient ischemic attack (TIA), and cerebral infarction without residual deficits: Secondary | ICD-10-CM | POA: Diagnosis not present

## 2017-04-14 DIAGNOSIS — Z86718 Personal history of other venous thrombosis and embolism: Secondary | ICD-10-CM | POA: Diagnosis not present

## 2017-04-14 DIAGNOSIS — Z86711 Personal history of pulmonary embolism: Secondary | ICD-10-CM | POA: Diagnosis not present

## 2017-04-14 LAB — COAGUCHEK XS/INR WAIVED
INR: 2.3 — ABNORMAL HIGH (ref 0.9–1.1)
Prothrombin Time: 27.8 s

## 2017-04-14 MED ORDER — WARFARIN SODIUM 2 MG PO TABS: ORAL_TABLET | ORAL | 1 refills | Status: DC

## 2017-04-14 NOTE — Progress Notes (Signed)
   HPI  Patient presents today here to establish care, previous provider is retiring.  Patient has history of DVT and pulmonary embolism. He is treated with Coumadin with no bleeding. Stable dosing with good compliance. Needs refill.  COPD previous smoker, helped by spiriva Breathing is stable currently.  Patient is currently undergoing treatment for lung cancer, he is doing well and has good follow-up.  PMH: Smoking status noted ROS: Per HPI  Objective: BP 115/77   Pulse (!) 106   Temp 97.2 F (36.2 C) (Oral)   Ht '5\' 9"'$  (1.753 m)   Wt 221 lb (100.2 kg)   BMI 32.64 kg/m  Gen: NAD, alert, cooperative with exam HEENT: NCAT CV: slightly tachy, noormal rhythm Resp: CTABL, no wheezes, non-labored Ext: No edema, warm Neuro: Alert and oriented, No gross deficits Skin Hypopigmented scarring on bilateral forearms  Assessment and plan:  # COPD Stable, no changes to spiriva  # Hx of CVA, PE, DVT INR theraputic, coumadin refilled, no dose changes See Clinical pharmacist in 1 month Red flags for seeking medical care reviewed  # Lung cancer Patient currently receiving treatment, good prognosis    Meds ordered this encounter  Medications  . warfarin (COUMADIN) 2 MG tablet    Sig: TAKE '2MG'$  (=1 TABLET) ON MONDAYS  AND FRIDAYS AND '4MG'$  (= 2 TABLETS)  ALL OTHER DAYS    Dispense:  135 tablet    Refill:  El Tumbao, MD [place] 04/14/2017, 8:18 AM

## 2017-04-14 NOTE — Patient Instructions (Signed)
Great to see you!  Come back in 6 months unless you need [place] sooner.   There are no need for coumadin changes for now, see Tammy again in 4 weeks.

## 2017-05-06 NOTE — Progress Notes (Addendum)
Travis Cruz 71 yo man with a 14 mm putative non-small cell carcinoma of the right lower lung - Clinical stage IA radiation completed 04-02-17, one month FU.   Weight changes, if any: Wt Readings from Last 3 Encounters:  05/12/17 223 lb 9.6 oz (101.4 kg)  04/14/17 221 lb (100.2 kg)  04/02/17 220 lb 6.4 oz (100 kg)   Respiratory complaints, if any: Denies SOB,coughing or wheezing Hemoptysis, if POL:IDCV   Swallowing Problems/Pain/Difficulty swallowing:Denies having problems swallowing while eating or drinking. Smoking Tobacco/Marijuana/Snuff/ETOH use: Former smoker quit 07-22-08 11/3 P/D no drug or alcohol usage Appetite :Good eating two meals daily. Pain:None Skin color normal to chest to radiation site. When is next chemo scheduled?:None Imaging:N/A Lab work from of chart:04-14-17 [place] chek BP (!) 139/98   Pulse (!) 101   Temp 98 F (36.7 C) (Oral)   Resp 18   Ht '5\' 9"'$  (1.753 m)   Wt 223 lb 9.6 oz (101.4 kg)   SpO2 95%   BMI 33.02 kg/m

## 2017-05-12 ENCOUNTER — Ambulatory Visit
Admission: RE | Admit: 2017-05-12 | Discharge: 2017-05-12 | Disposition: A | Discharge status: Home / Self Care | Payer: BLUE CROSS/BLUE SHIELD | Source: Ambulatory Visit | Attending: Urology | Admitting: Urology

## 2017-05-12 VITALS — BP 139/98 | HR 101 | Temp 98.0°F | Resp 18 | Ht 69.0 in | Wt 223.6 lb

## 2017-05-12 DIAGNOSIS — Z888 Allergy status to other drugs, medicaments and biological substances status: Secondary | ICD-10-CM | POA: Insufficient documentation

## 2017-05-12 DIAGNOSIS — C3431 Malignant neoplasm of lower lobe, right bronchus or lung: Secondary | ICD-10-CM | POA: Insufficient documentation

## 2017-05-12 DIAGNOSIS — Z79899 Other long term (current) drug therapy: Secondary | ICD-10-CM | POA: Diagnosis not present

## 2017-05-12 DIAGNOSIS — Z7901 Long term (current) use of anticoagulants: Secondary | ICD-10-CM | POA: Insufficient documentation

## 2017-05-12 NOTE — Addendum Note (Signed)
Encounter addended by: Malena Edman, RN on: 05/12/2017  2:15 PM<BR>    Actions taken: Charge Capture section accepted

## 2017-05-12 NOTE — Progress Notes (Signed)
Radiation Oncology         [phone] ________________________________  Name: Travis Cruz MRN: [id number]  Date: 05/12/2017  DOB: 08/08/1946  Post Treatment Note  CC: Timmothy Euler, MD  Grace Isaac, MD  Diagnosis:  14 mm putative non-small cell carcinoma of the right lower lung - Clinical stage IA     Interval Since Last Radiation:  5 weeks s/p SBRT to the RLL 03/23/2017 to 04/02/2017:  The target was treated to 50 Gy in 5 fractions of 10 Gy    Narrative:  The patient returns today for routine follow-up. He tolerated his treatments very well and did not experience any significant side effects such as dysphagia, chest pain, SOB or nausea throughout his treatments.                              On review of systems, the patient states that he is doing well and is currently without complaints.  He specifically denies dysphagia, chest pain, productive cough, shortness of breath, fever, chills, N/V.  He reports that his appetite is good and energy level is stable.    ALLERGIES:  is allergic to ativan [lorazepam].  Meds: Current Outpatient Prescriptions  Medication Sig Dispense Refill  . atorvastatin (LIPITOR) 80 MG tablet TAKE 1 TABLET BY MOUTH THREE TIMES PER WEEK 30 tablet 5  . levETIRAcetam (KEPPRA) 500 MG tablet TAKE 1 TABLET (500 MG TOTAL) BY MOUTH 2 TIMES DAILY. 60 tablet 2  . Multiple Vitamin (MULTIVITAMIN) tablet Take 1 tablet by mouth daily.    . Multiple Vitamins-Minerals (PRESERVISION AREDS 2) CAPS Take 2 tablets by mouth daily.     [name] SPIRIVA HANDIHALER 18 MCG inhalation capsule PLACE 1 CAPSULE (18 MCG TOTAL) INTO INHALER AND INHALE DAILY. 30 capsule 3  . warfarin (COUMADIN) 2 MG tablet TAKE '2MG'$  (=1 TABLET) ON MONDAYS  AND FRIDAYS AND '4MG'$  (= 2 TABLETS)  ALL OTHER DAYS 135 tablet 1   No current facility-administered medications for this encounter.     Physical Findings:  height is '5\' 9"'$  (1.753 m) and weight is 223 lb 9.6 oz (101.4 kg). His oral temperature  is 98 F (36.7 C). His blood pressure is 139/98 (abnormal) and his pulse is 101 (abnormal). His respiration is 18 and oxygen saturation is 95%.  Pain Assessment Pain Score: 0-No pain/10 In general this is a well appearing caucasian male in no acute distress. He's alert and oriented x4 and appropriate throughout the examination. Cardiopulmonary assessment is negative for acute distress and he exhibits normal effort.   Lab Findings: Lab Results  Component Value Date   WBC 9.4 04/15/2016   HGB 17.7 05/18/2014   HCT 55.2 (H) 04/15/2016   MCV 89 04/15/2016   PLT 147 (L) 04/15/2016     Radiographic Findings: No results found.  Impression/Plan: 1. 14 mm putative non-small cell carcinoma of the right lower lung - Clinical stage IA. The patient appears to be doing well following radiotherapy. We will proceed with a CT scan of the chest with contrast and BMP to make sure that he can receive contrast before he undergoes a CT scan in the next few weeks. I'll follow up with him by phone with these results, and provided that this scan is stable, we will move forward with serial imaging at six-month intervals until 5 years when, at that point in time, he will have an annual low dose CT scan for surveillance.  He will return in 6 months to review the findings from the scan to be ordered prior to that visit, provided that the scan ordered today is stable.  I will forward a copy of my note today and the scan results when available, to Dr. Servando Snare for his records as well.    Nicholos Johns, PA-C

## 2017-05-16 ENCOUNTER — Other Ambulatory Visit: Payer: Self-pay | Admitting: Family Medicine

## 2017-05-18 ENCOUNTER — Telehealth: Payer: Self-pay | Admitting: *Deleted

## 2017-05-18 NOTE — Telephone Encounter (Signed)
CALLED PATIENT TO INFORM OF STAT LABS ON 05-19-17 @ 12:45 PM @ [facility] CT ON 05-19-17 @ 2 PM @ [facility], PT. TO HAVE CLEAR LIQUIDS ONLY - 4 HRS. PRIOR TO THIS TEST, SPOKE WITH PATIENT'S WIFE [name] AND SHE IS AWARE OF THESE APPTS.

## 2017-05-19 ENCOUNTER — Encounter (HOSPITAL_COMMUNITY): Payer: Self-pay

## 2017-05-19 ENCOUNTER — Ambulatory Visit
Admission: RE | Admit: 2017-05-19 | Discharge: 2017-05-19 | Disposition: A | Discharge status: Home / Self Care | Payer: BLUE CROSS/BLUE SHIELD | Source: Ambulatory Visit | Attending: Urology | Admitting: Urology

## 2017-05-19 ENCOUNTER — Ambulatory Visit (HOSPITAL_COMMUNITY)
Admission: RE | Admit: 2017-05-19 | Discharge: 2017-05-19 | Disposition: A | Discharge status: Home / Self Care | Payer: BLUE CROSS/BLUE SHIELD | Source: Ambulatory Visit | Attending: Urology | Admitting: Urology

## 2017-05-19 ENCOUNTER — Other Ambulatory Visit: Payer: Self-pay | Admitting: Family Medicine

## 2017-05-19 ENCOUNTER — Other Ambulatory Visit: Payer: Self-pay | Admitting: *Deleted

## 2017-05-19 DIAGNOSIS — Y842 Radiological procedure and radiotherapy as the cause of abnormal reaction of the patient, or of later complication, without mention of misadventure at the time of the procedure: Secondary | ICD-10-CM | POA: Diagnosis not present

## 2017-05-19 DIAGNOSIS — C3431 Malignant neoplasm of lower lobe, right bronchus or lung: Secondary | ICD-10-CM | POA: Diagnosis not present

## 2017-05-19 DIAGNOSIS — C3412 Malignant neoplasm of upper lobe, left bronchus or lung: Secondary | ICD-10-CM | POA: Diagnosis not present

## 2017-05-19 DIAGNOSIS — Z7901 Long term (current) use of anticoagulants: Secondary | ICD-10-CM | POA: Diagnosis not present

## 2017-05-19 DIAGNOSIS — Z923 Personal history of irradiation: Secondary | ICD-10-CM | POA: Insufficient documentation

## 2017-05-19 DIAGNOSIS — Z888 Allergy status to other drugs, medicaments and biological substances status: Secondary | ICD-10-CM | POA: Diagnosis not present

## 2017-05-19 DIAGNOSIS — Z79899 Other long term (current) drug therapy: Secondary | ICD-10-CM | POA: Diagnosis not present

## 2017-05-19 DIAGNOSIS — C3411 Malignant neoplasm of upper lobe, right bronchus or lung: Secondary | ICD-10-CM | POA: Diagnosis not present

## 2017-05-19 LAB — BUN AND CREATININE (CC13)
BUN: 12.8 mg/dL (ref 7.0–26.0)
Creatinine: 0.9 mg/dL (ref 0.7–1.3)
EGFR: 83 mL/min/{1.73_m2} — ABNORMAL LOW (ref >90)

## 2017-05-19 MED ORDER — IOPAMIDOL (ISOVUE-300) INJECTION 61%
INTRAVENOUS | Status: AC
  Administered 2017-05-19: 75 mL
  Filled 2017-05-19: qty 75

## 2017-05-20 ENCOUNTER — Ambulatory Visit (INDEPENDENT_AMBULATORY_CARE_PROVIDER_SITE_OTHER): Payer: BLUE CROSS/BLUE SHIELD | Admitting: Pharmacist

## 2017-05-20 DIAGNOSIS — Z86718 Personal history of other venous thrombosis and embolism: Secondary | ICD-10-CM

## 2017-05-20 DIAGNOSIS — Z8673 Personal history of transient ischemic attack (TIA), and cerebral infarction without residual deficits: Secondary | ICD-10-CM | POA: Diagnosis not present

## 2017-05-20 DIAGNOSIS — Z86711 Personal history of pulmonary embolism: Secondary | ICD-10-CM

## 2017-05-20 LAB — COAGUCHEK XS/INR WAIVED
INR: 3.5 — ABNORMAL HIGH (ref 0.9–1.1)
Prothrombin Time: 41.8 s

## 2017-05-26 ENCOUNTER — Telehealth: Payer: Self-pay | Admitting: Urology

## 2017-05-26 DIAGNOSIS — C3412 Malignant neoplasm of upper lobe, left bronchus or lung: Secondary | ICD-10-CM

## 2017-05-26 DIAGNOSIS — C3431 Malignant neoplasm of lower lobe, right bronchus or lung: Secondary | ICD-10-CM

## 2017-05-26 NOTE — Telephone Encounter (Signed)
I called the home phone to speak with Mr. Kattner and relayed the results from his recent ET chest in follow-up from recent radiotherapy to the right lower lobe lesion.  I left a detailed message on the home phone indicating that his recent scan shows a positive response to treatment with decrease in size of the right lower lobe lesion, as there was no answer. I tried calling Mr. Krage's cell phone and his wife [name] answered.  I shared the results with her and advised that we proceed as planned with a 6 month follow-up with a repeat CT chest prior to that visit.  She is in agreement with this plan and states her compliance. I encouraged her to call with any concerns or questions in the interim.

## 2017-06-10 ENCOUNTER — Ambulatory Visit (INDEPENDENT_AMBULATORY_CARE_PROVIDER_SITE_OTHER): Payer: BLUE CROSS/BLUE SHIELD | Admitting: Pharmacist

## 2017-06-10 DIAGNOSIS — Z8673 Personal history of transient ischemic attack (TIA), and cerebral infarction without residual deficits: Secondary | ICD-10-CM

## 2017-06-10 DIAGNOSIS — Z86711 Personal history of pulmonary embolism: Secondary | ICD-10-CM

## 2017-06-10 DIAGNOSIS — Z86718 Personal history of other venous thrombosis and embolism: Secondary | ICD-10-CM

## 2017-06-10 LAB — COAGUCHEK XS/INR WAIVED
INR: 2.4 — ABNORMAL HIGH (ref 0.9–1.1)
Prothrombin Time: 28.8 s

## 2017-07-02 ENCOUNTER — Ambulatory Visit (INDEPENDENT_AMBULATORY_CARE_PROVIDER_SITE_OTHER): Payer: BLUE CROSS/BLUE SHIELD | Admitting: *Deleted

## 2017-07-02 DIAGNOSIS — I255 Ischemic cardiomyopathy: Secondary | ICD-10-CM

## 2017-07-03 NOTE — Progress Notes (Signed)
Remote ICD transmission.   

## 2017-07-10 ENCOUNTER — Encounter: Payer: Self-pay | Admitting: Cardiology

## 2017-07-15 ENCOUNTER — Ambulatory Visit (INDEPENDENT_AMBULATORY_CARE_PROVIDER_SITE_OTHER): Payer: BLUE CROSS/BLUE SHIELD | Admitting: Pharmacist

## 2017-07-15 DIAGNOSIS — Z86711 Personal history of pulmonary embolism: Secondary | ICD-10-CM | POA: Diagnosis not present

## 2017-07-15 DIAGNOSIS — Z8673 Personal history of transient ischemic attack (TIA), and cerebral infarction without residual deficits: Secondary | ICD-10-CM | POA: Diagnosis not present

## 2017-07-15 DIAGNOSIS — Z86718 Personal history of other venous thrombosis and embolism: Secondary | ICD-10-CM

## 2017-07-15 LAB — COAGUCHEK XS/INR WAIVED
INR: 3.1 — ABNORMAL HIGH (ref 0.9–1.1)
Prothrombin Time: 37.3 s

## 2017-07-15 NOTE — Patient Instructions (Signed)
Anticoagulation Warfarin Dose Instructions as of 07/15/2017      Dorene Grebe Tue Wed Thu Fri Sat   New Dose 3 mg 4 mg 3 mg 3 mg 3 mg 4 mg 3 mg    Description   Continue current warfarin dose - take 1 tablet on Mondays and Fridays.  Take 2 tablets all other days.  INR was 3.1 today

## 2017-07-22 ENCOUNTER — Encounter: Payer: Self-pay | Admitting: Internal Medicine

## 2017-07-22 ENCOUNTER — Ambulatory Visit (INDEPENDENT_AMBULATORY_CARE_PROVIDER_SITE_OTHER): Payer: BLUE CROSS/BLUE SHIELD | Admitting: Internal Medicine

## 2017-07-22 VITALS — BP 126/88 | HR 106 | Ht 69.0 in | Wt 220.0 lb

## 2017-07-22 DIAGNOSIS — I255 Ischemic cardiomyopathy: Secondary | ICD-10-CM | POA: Diagnosis not present

## 2017-07-22 DIAGNOSIS — Z95810 Presence of automatic (implantable) cardiac defibrillator: Secondary | ICD-10-CM | POA: Diagnosis not present

## 2017-07-22 DIAGNOSIS — I428 Other cardiomyopathies: Secondary | ICD-10-CM | POA: Diagnosis not present

## 2017-07-22 DIAGNOSIS — I4901 Ventricular fibrillation: Secondary | ICD-10-CM

## 2017-07-22 DIAGNOSIS — I5022 Chronic systolic (congestive) heart failure: Secondary | ICD-10-CM | POA: Diagnosis not present

## 2017-07-22 DIAGNOSIS — I472 Ventricular tachycardia, unspecified: Secondary | ICD-10-CM

## 2017-07-22 NOTE — Patient Instructions (Addendum)
Medication Instructions: - Your physician recommends that you continue on your current medications as directed. Please refer to the Current Medication list given to you today.  Labwork: - Your physician recommends that you have lab work today: CMET/ CBC/ TSH  Procedures/Testing: - none ordered  Follow-Up: - Remote monitoring is used to monitor your Pacemaker of ICD from home. This monitoring reduces the number of office visits required to check your device to one time per year. It allows [place] to keep an eye on the functioning of your device to ensure it is working properly. You are scheduled for a device check from home on 10/01/17. You may send your transmission at any time that day. If you have a wireless device, the transmission will be sent automatically. After your physician reviews your transmission, you will receive a postcard with your next transmission date.  - Your physician wants you to follow-up in: 1 year with Dr. Caryl Comes.  You will receive a reminder letter in the mail two months in advance. If you don't receive a letter, please call our office to schedule the follow-up appointment.   Any Additional Special Instructions Will Be Listed Below (If Applicable).     If you need a refill on your cardiac medications before your next appointment, please call your pharmacy.
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
## 2017-07-23 LAB — CBC WITH DIFFERENTIAL/PLATELET
Basophils Absolute: 0 10*3/uL (ref 0.0–0.2)
Basos: 0 %
EOS (ABSOLUTE): 0.2 10*3/uL (ref 0.0–0.4)
Eos: 1 %
Hematocrit: 55.7 % — ABNORMAL HIGH (ref 37.5–51.0)
Hemoglobin: 18.9 g/dL — ABNORMAL HIGH (ref 13.0–17.7)
Immature Grans (Abs): 0.1 10*3/uL (ref 0.0–0.1)
Immature Granulocytes: 1 %
Lymphocytes Absolute: 2.5 10*3/uL (ref 0.7–3.1)
Lymphs: 17 %
MCH: 30 pg (ref 26.6–33.0)
MCHC: 33.9 g/dL (ref 31.5–35.7)
MCV: 89 fL (ref 79–97)
Monocytes Absolute: 1.6 10*3/uL — ABNORMAL HIGH (ref 0.1–0.9)
Monocytes: 11 %
Neutrophils Absolute: 10.6 10*3/uL — ABNORMAL HIGH (ref 1.4–7.0)
Neutrophils: 70 %
RBC: 6.29 x10E6/uL — ABNORMAL HIGH (ref 4.14–5.80)
RDW: 14.1 % (ref 12.3–15.4)
WBC: 15.1 10*3/uL — ABNORMAL HIGH (ref 3.4–10.8)

## 2017-07-23 LAB — COMPREHENSIVE METABOLIC PANEL
ALT: 16 IU/L (ref 0–44)
AST: 19 IU/L (ref 0–40)
Albumin/Globulin Ratio: 1.2 (ref 1.2–2.2)
Albumin: 4.2 g/dL (ref 3.6–4.8)
Alkaline Phosphatase: 135 IU/L — ABNORMAL HIGH (ref 39–117)
BUN/Creatinine Ratio: 14 (ref 10–24)
BUN: 13 mg/dL (ref 8–27)
Bilirubin Total: 0.7 mg/dL (ref 0.0–1.2)
CO2: 23 mmol/L (ref 20–29)
Calcium: 9.5 mg/dL (ref 8.6–10.2)
Chloride: 99 mmol/L (ref 96–106)
Creatinine, Ser: 0.91 mg/dL (ref 0.76–1.27)
GFR calc Af Amer: 99 mL/min/{1.73_m2} (ref >59)
GFR calc non Af Amer: 86 mL/min/{1.73_m2} (ref >59)
Globulin, Total: 3.4 g/dL (ref 1.5–4.5)
Glucose: 86 mg/dL (ref 65–99)
Potassium: 4.8 mmol/L (ref 3.5–5.2)
Sodium: 140 mmol/L (ref 134–144)
Total Protein: 7.6 g/dL (ref 6.0–8.5)

## 2017-07-23 LAB — TSH: TSH: 0.992 u[IU]/mL (ref 0.450–4.500)

## 2017-07-28 LAB — CUP PACEART INCLINIC DEVICE CHECK
Battery Remaining Longevity: 61 mo
Brady Statistic RV Percent Paced: 0 %
Date Time Interrogation Session: 20180725140103
HighPow Impedance: 94.5 Ohm
Implantable Lead Implant Date: 20140205
Implantable Lead Location: 753860
Implantable Lead Model: 181
Implantable Lead Serial Number: 323859
Implantable Pulse Generator Implant Date: 20140205
Lead Channel Impedance Value: 512.5 Ohm
Lead Channel Pacing Threshold Amplitude: 0.75 V
Lead Channel Pacing Threshold Pulse Width: 0.5 ms
Lead Channel Sensing Intrinsic Amplitude: 11.8 mV
Lead Channel Setting Pacing Amplitude: 2.5 V
Lead Channel Setting Pacing Pulse Width: 0.5 ms
Lead Channel Setting Sensing Sensitivity: 0.5 mV
Pulse Gen Serial Number: 7040910

## 2017-07-28 LAB — CUP PACEART REMOTE DEVICE CHECK
Battery Remaining Longevity: 61 mo
Battery Remaining Percentage: 61 %
Battery Voltage: 2.95 V
Brady Statistic RV Percent Paced: 0 %
Date Time Interrogation Session: 20180705060023
HighPow Impedance: 95 Ohm
HighPow Impedance: 95 Ohm
Implantable Lead Implant Date: 20140205
Implantable Lead Location: 753860
Implantable Lead Model: 181
Implantable Lead Serial Number: 323859
Implantable Pulse Generator Implant Date: 20140205
Lead Channel Impedance Value: 480 Ohm
Lead Channel Pacing Threshold Amplitude: 0.75 V
Lead Channel Pacing Threshold Pulse Width: 0.5 ms
Lead Channel Sensing Intrinsic Amplitude: 11.8 mV
Lead Channel Setting Pacing Amplitude: 2.5 V
Lead Channel Setting Pacing Pulse Width: 0.5 ms
Lead Channel Setting Sensing Sensitivity: 0.5 mV
Pulse Gen Serial Number: 7040910

## 2017-08-12 ENCOUNTER — Ambulatory Visit (INDEPENDENT_AMBULATORY_CARE_PROVIDER_SITE_OTHER): Payer: BLUE CROSS/BLUE SHIELD | Admitting: Pharmacist

## 2017-08-12 DIAGNOSIS — Z8673 Personal history of transient ischemic attack (TIA), and cerebral infarction without residual deficits: Secondary | ICD-10-CM

## 2017-08-12 DIAGNOSIS — Z86718 Personal history of other venous thrombosis and embolism: Secondary | ICD-10-CM

## 2017-08-12 DIAGNOSIS — Z86711 Personal history of pulmonary embolism: Secondary | ICD-10-CM | POA: Diagnosis not present

## 2017-08-12 LAB — COAGUCHEK XS/INR WAIVED
INR: 4.5 — ABNORMAL HIGH (ref 0.9–1.1)
Prothrombin Time: 53.5 s

## 2017-08-12 NOTE — Patient Instructions (Signed)
Anticoagulation Warfarin Dose Instructions as of 08/12/2017      Dorene Grebe Tue Wed Thu Fri Sat   New Dose 3 mg 3 mg 3 mg 3 mg 3 mg 3 mg 3 mg    Description   No warfarin today - Wednesday, August 15th or tomorrow Thursday, August 16th.  Then decrease dose to warfarin 2mg  tablets - take 1 and 1/2 tablets daily (=3mg )  INR was 4.5 today (goal is 2.0 to 3.0)

## 2017-08-24 ENCOUNTER — Telehealth: Payer: Self-pay | Admitting: Family Medicine

## 2017-08-24 DIAGNOSIS — G4733 Obstructive sleep apnea (adult) (pediatric): Secondary | ICD-10-CM | POA: Diagnosis not present

## 2017-08-24 NOTE — Telephone Encounter (Signed)
FYI

## 2017-08-24 NOTE — Telephone Encounter (Signed)
ppreciate update, pt refuses hematology referral.   Laroy Apple, MD [facility] 08/24/2017, 5:01 PM

## 2017-08-26 ENCOUNTER — Ambulatory Visit (INDEPENDENT_AMBULATORY_CARE_PROVIDER_SITE_OTHER): Payer: BLUE CROSS/BLUE SHIELD | Admitting: *Deleted

## 2017-08-26 DIAGNOSIS — Z86718 Personal history of other venous thrombosis and embolism: Secondary | ICD-10-CM | POA: Diagnosis not present

## 2017-08-26 DIAGNOSIS — Z86711 Personal history of pulmonary embolism: Secondary | ICD-10-CM

## 2017-08-26 LAB — COAGUCHEK XS/INR WAIVED
INR: 2.2 — ABNORMAL HIGH (ref 0.9–1.1)
Prothrombin Time: 26.9 s

## 2017-08-26 NOTE — Patient Instructions (Signed)
Anticoagulation Warfarin Dose Instructions as of 08/26/2017      Travis Cruz Tue Wed Thu Fri Sat   New Dose 3 mg 3 mg 3 mg 3 mg 3 mg 3 mg 3 mg    Description   Take 1 1/2 tablets daily  INR was 2.6 today (goal is 2.0 to 3.0)  Return in 2 weeks on 09/09/17 at 8:00 am

## 2017-08-26 NOTE — Progress Notes (Addendum)
Subjective:     Indication: DVT, PE and TIA Bleeding signs/symptoms: None Thromboembolic signs/symptoms: None  Missed Coumadin doses: None Medication changes: no Dietary changes: no Bacterial/viral infection: no Other concerns: no  Dose adjustment at last visit on 08/12/17.   Objective:    INR Today: 2.2 Current dose:  Anticoagulation Warfarin Dose Instructions as of 08/26/2017      Dorene Grebe Tue Wed Thu Fri Sat   New Dose 3 mg 3 mg 3 mg 3 mg 3 mg 3 mg 3 mg    Description   Take 1 1/2 tablets daily  INR was 2.2 today (goal is 2.0 to 3.0)  Return in 2 weeks on 09/09/17 at 8:00 am      Assessment:    Therapeutic INR for goal of 2-3   Plan:    1. New dose: no change   2. Next INR: 2 weeks to make sure it is stable since dose adjustment was made at last visit on 08/12/17.  Chong Sicilian, RN

## 2017-09-07 ENCOUNTER — Inpatient Hospital Stay (HOSPITAL_COMMUNITY)
Admission: EM | Admit: 2017-09-07 | Discharge: 2017-09-18 | DRG: 480 | Disposition: A | Discharge status: Skilled Nursing Facility | Payer: BLUE CROSS/BLUE SHIELD | Attending: Internal Medicine | Admitting: Internal Medicine

## 2017-09-07 ENCOUNTER — Emergency Department (HOSPITAL_COMMUNITY): Payer: BLUE CROSS/BLUE SHIELD

## 2017-09-07 ENCOUNTER — Encounter (HOSPITAL_COMMUNITY): Payer: Self-pay | Admitting: Emergency Medicine

## 2017-09-07 DIAGNOSIS — I9789 Other postprocedural complications and disorders of the circulatory system, not elsewhere classified: Secondary | ICD-10-CM | POA: Diagnosis not present

## 2017-09-07 DIAGNOSIS — C3490 Malignant neoplasm of unspecified part of unspecified bronchus or lung: Secondary | ICD-10-CM | POA: Diagnosis not present

## 2017-09-07 DIAGNOSIS — J449 Chronic obstructive pulmonary disease, unspecified: Secondary | ICD-10-CM | POA: Diagnosis present

## 2017-09-07 DIAGNOSIS — W1830XA Fall on same level, unspecified, initial encounter: Secondary | ICD-10-CM | POA: Diagnosis present

## 2017-09-07 DIAGNOSIS — D689 Coagulation defect, unspecified: Secondary | ICD-10-CM | POA: Diagnosis present

## 2017-09-07 DIAGNOSIS — Z01810 Encounter for preprocedural cardiovascular examination: Secondary | ICD-10-CM | POA: Diagnosis not present

## 2017-09-07 DIAGNOSIS — R410 Disorientation, unspecified: Secondary | ICD-10-CM | POA: Diagnosis not present

## 2017-09-07 DIAGNOSIS — Z6832 Body mass index (BMI) 32.0-32.9, adult: Secondary | ICD-10-CM | POA: Diagnosis not present

## 2017-09-07 DIAGNOSIS — J9811 Atelectasis: Secondary | ICD-10-CM | POA: Diagnosis not present

## 2017-09-07 DIAGNOSIS — I82409 Acute embolism and thrombosis of unspecified deep veins of unspecified lower extremity: Secondary | ICD-10-CM | POA: Diagnosis present

## 2017-09-07 DIAGNOSIS — E669 Obesity, unspecified: Secondary | ICD-10-CM | POA: Diagnosis present

## 2017-09-07 DIAGNOSIS — D696 Thrombocytopenia, unspecified: Secondary | ICD-10-CM | POA: Diagnosis not present

## 2017-09-07 DIAGNOSIS — T796XXA Traumatic ischemia of muscle, initial encounter: Secondary | ICD-10-CM

## 2017-09-07 DIAGNOSIS — R339 Retention of urine, unspecified: Secondary | ICD-10-CM | POA: Diagnosis not present

## 2017-09-07 DIAGNOSIS — I472 Ventricular tachycardia: Secondary | ICD-10-CM | POA: Diagnosis not present

## 2017-09-07 DIAGNOSIS — S72009A Fracture of unspecified part of neck of unspecified femur, initial encounter for closed fracture: Secondary | ICD-10-CM

## 2017-09-07 DIAGNOSIS — S0990XA Unspecified injury of head, initial encounter: Secondary | ICD-10-CM | POA: Diagnosis not present

## 2017-09-07 DIAGNOSIS — R404 Transient alteration of awareness: Secondary | ICD-10-CM | POA: Diagnosis not present

## 2017-09-07 DIAGNOSIS — Y92009 Unspecified place in unspecified non-institutional (private) residence as the place of occurrence of the external cause: Secondary | ICD-10-CM

## 2017-09-07 DIAGNOSIS — R296 Repeated falls: Secondary | ICD-10-CM | POA: Diagnosis not present

## 2017-09-07 DIAGNOSIS — I471 Supraventricular tachycardia: Secondary | ICD-10-CM | POA: Diagnosis not present

## 2017-09-07 DIAGNOSIS — S72142A Displaced intertrochanteric fracture of left femur, initial encounter for closed fracture: Principal | ICD-10-CM | POA: Diagnosis present

## 2017-09-07 DIAGNOSIS — S80211A Abrasion, right knee, initial encounter: Secondary | ICD-10-CM | POA: Diagnosis not present

## 2017-09-07 DIAGNOSIS — J189 Pneumonia, unspecified organism: Secondary | ICD-10-CM

## 2017-09-07 DIAGNOSIS — Z9981 Dependence on supplemental oxygen: Secondary | ICD-10-CM

## 2017-09-07 DIAGNOSIS — I4891 Unspecified atrial fibrillation: Secondary | ICD-10-CM | POA: Diagnosis present

## 2017-09-07 DIAGNOSIS — G4733 Obstructive sleep apnea (adult) (pediatric): Secondary | ICD-10-CM | POA: Diagnosis not present

## 2017-09-07 DIAGNOSIS — I2510 Atherosclerotic heart disease of native coronary artery without angina pectoris: Secondary | ICD-10-CM | POA: Diagnosis not present

## 2017-09-07 DIAGNOSIS — S0083XA Contusion of other part of head, initial encounter: Secondary | ICD-10-CM

## 2017-09-07 DIAGNOSIS — I5032 Chronic diastolic (congestive) heart failure: Secondary | ICD-10-CM

## 2017-09-07 DIAGNOSIS — R061 Stridor: Secondary | ICD-10-CM | POA: Diagnosis not present

## 2017-09-07 DIAGNOSIS — S7290XA Unspecified fracture of unspecified femur, initial encounter for closed fracture: Secondary | ICD-10-CM

## 2017-09-07 DIAGNOSIS — R531 Weakness: Secondary | ICD-10-CM | POA: Diagnosis not present

## 2017-09-07 DIAGNOSIS — Z86711 Personal history of pulmonary embolism: Secondary | ICD-10-CM

## 2017-09-07 DIAGNOSIS — Z86718 Personal history of other venous thrombosis and embolism: Secondary | ICD-10-CM

## 2017-09-07 DIAGNOSIS — D72828 Other elevated white blood cell count: Secondary | ICD-10-CM | POA: Diagnosis present

## 2017-09-07 DIAGNOSIS — Z8674 Personal history of sudden cardiac arrest: Secondary | ICD-10-CM | POA: Diagnosis not present

## 2017-09-07 DIAGNOSIS — S72012A Unspecified intracapsular fracture of left femur, initial encounter for closed fracture: Secondary | ICD-10-CM | POA: Diagnosis not present

## 2017-09-07 DIAGNOSIS — R52 Pain, unspecified: Secondary | ICD-10-CM

## 2017-09-07 DIAGNOSIS — I248 Other forms of acute ischemic heart disease: Secondary | ICD-10-CM | POA: Diagnosis not present

## 2017-09-07 DIAGNOSIS — Z9989 Dependence on other enabling machines and devices: Secondary | ICD-10-CM

## 2017-09-07 DIAGNOSIS — S40021A Contusion of right upper arm, initial encounter: Secondary | ICD-10-CM

## 2017-09-07 DIAGNOSIS — S72002A Fracture of unspecified part of neck of left femur, initial encounter for closed fracture: Secondary | ICD-10-CM

## 2017-09-07 DIAGNOSIS — I252 Old myocardial infarction: Secondary | ICD-10-CM | POA: Diagnosis present

## 2017-09-07 DIAGNOSIS — I5043 Acute on chronic combined systolic (congestive) and diastolic (congestive) heart failure: Secondary | ICD-10-CM | POA: Diagnosis not present

## 2017-09-07 DIAGNOSIS — G40909 Epilepsy, unspecified, not intractable, without status epilepticus: Secondary | ICD-10-CM | POA: Diagnosis present

## 2017-09-07 DIAGNOSIS — R0989 Other specified symptoms and signs involving the circulatory and respiratory systems: Secondary | ICD-10-CM

## 2017-09-07 DIAGNOSIS — Q211 Atrial septal defect: Secondary | ICD-10-CM | POA: Diagnosis not present

## 2017-09-07 DIAGNOSIS — S61411A Laceration without foreign body of right hand, initial encounter: Secondary | ICD-10-CM

## 2017-09-07 DIAGNOSIS — I5031 Acute diastolic (congestive) heart failure: Secondary | ICD-10-CM | POA: Diagnosis not present

## 2017-09-07 DIAGNOSIS — R0602 Shortness of breath: Secondary | ICD-10-CM | POA: Diagnosis not present

## 2017-09-07 DIAGNOSIS — J439 Emphysema, unspecified: Secondary | ICD-10-CM | POA: Diagnosis not present

## 2017-09-07 DIAGNOSIS — I824Z9 Acute embolism and thrombosis of unspecified deep veins of unspecified distal lower extremity: Secondary | ICD-10-CM | POA: Diagnosis not present

## 2017-09-07 DIAGNOSIS — I482 Chronic atrial fibrillation: Secondary | ICD-10-CM | POA: Diagnosis not present

## 2017-09-07 DIAGNOSIS — I110 Hypertensive heart disease with heart failure: Secondary | ICD-10-CM | POA: Diagnosis present

## 2017-09-07 DIAGNOSIS — Z7901 Long term (current) use of anticoagulants: Secondary | ICD-10-CM

## 2017-09-07 DIAGNOSIS — M7989 Other specified soft tissue disorders: Secondary | ICD-10-CM | POA: Diagnosis not present

## 2017-09-07 DIAGNOSIS — I214 Non-ST elevation (NSTEMI) myocardial infarction: Secondary | ICD-10-CM | POA: Diagnosis not present

## 2017-09-07 DIAGNOSIS — S72001C Fracture of unspecified part of neck of right femur, initial encounter for open fracture type IIIA, IIIB, or IIIC: Secondary | ICD-10-CM | POA: Diagnosis not present

## 2017-09-07 DIAGNOSIS — M25562 Pain in left knee: Secondary | ICD-10-CM | POA: Diagnosis not present

## 2017-09-07 DIAGNOSIS — W19XXXA Unspecified fall, initial encounter: Secondary | ICD-10-CM

## 2017-09-07 DIAGNOSIS — E785 Hyperlipidemia, unspecified: Secondary | ICD-10-CM | POA: Diagnosis present

## 2017-09-07 DIAGNOSIS — I10 Essential (primary) hypertension: Secondary | ICD-10-CM | POA: Diagnosis present

## 2017-09-07 DIAGNOSIS — S72142D Displaced intertrochanteric fracture of left femur, subsequent encounter for closed fracture with routine healing: Secondary | ICD-10-CM | POA: Diagnosis not present

## 2017-09-07 DIAGNOSIS — M25572 Pain in left ankle and joints of left foot: Secondary | ICD-10-CM | POA: Diagnosis not present

## 2017-09-07 DIAGNOSIS — R791 Abnormal coagulation profile: Secondary | ICD-10-CM | POA: Diagnosis not present

## 2017-09-07 DIAGNOSIS — Z8719 Personal history of other diseases of the digestive system: Secondary | ICD-10-CM

## 2017-09-07 DIAGNOSIS — M6282 Rhabdomyolysis: Secondary | ICD-10-CM | POA: Diagnosis not present

## 2017-09-07 DIAGNOSIS — I255 Ischemic cardiomyopathy: Secondary | ICD-10-CM | POA: Diagnosis present

## 2017-09-07 DIAGNOSIS — J9621 Acute and chronic respiratory failure with hypoxia: Secondary | ICD-10-CM | POA: Diagnosis not present

## 2017-09-07 DIAGNOSIS — M79672 Pain in left foot: Secondary | ICD-10-CM | POA: Diagnosis not present

## 2017-09-07 DIAGNOSIS — Z95810 Presence of automatic (implantable) cardiac defibrillator: Secondary | ICD-10-CM | POA: Diagnosis not present

## 2017-09-07 DIAGNOSIS — Z8673 Personal history of transient ischemic attack (TIA), and cerebral infarction without residual deficits: Secondary | ICD-10-CM

## 2017-09-07 DIAGNOSIS — Z87891 Personal history of nicotine dependence: Secondary | ICD-10-CM

## 2017-09-07 DIAGNOSIS — Z888 Allergy status to other drugs, medicaments and biological substances status: Secondary | ICD-10-CM

## 2017-09-07 DIAGNOSIS — M25552 Pain in left hip: Secondary | ICD-10-CM | POA: Diagnosis not present

## 2017-09-07 DIAGNOSIS — E860 Dehydration: Secondary | ICD-10-CM | POA: Diagnosis present

## 2017-09-07 DIAGNOSIS — Z419 Encounter for procedure for purposes other than remedying health state, unspecified: Secondary | ICD-10-CM

## 2017-09-07 DIAGNOSIS — M1712 Unilateral primary osteoarthritis, left knee: Secondary | ICD-10-CM | POA: Diagnosis present

## 2017-09-07 DIAGNOSIS — T148XXA Other injury of unspecified body region, initial encounter: Secondary | ICD-10-CM

## 2017-09-07 DIAGNOSIS — M6281 Muscle weakness (generalized): Secondary | ICD-10-CM | POA: Diagnosis not present

## 2017-09-07 DIAGNOSIS — R748 Abnormal levels of other serum enzymes: Secondary | ICD-10-CM | POA: Diagnosis not present

## 2017-09-07 DIAGNOSIS — R41841 Cognitive communication deficit: Secondary | ICD-10-CM | POA: Diagnosis not present

## 2017-09-07 DIAGNOSIS — Z79899 Other long term (current) drug therapy: Secondary | ICD-10-CM

## 2017-09-07 DIAGNOSIS — D72829 Elevated white blood cell count, unspecified: Secondary | ICD-10-CM | POA: Diagnosis not present

## 2017-09-07 DIAGNOSIS — Z923 Personal history of irradiation: Secondary | ICD-10-CM

## 2017-09-07 DIAGNOSIS — Z9081 Acquired absence of spleen: Secondary | ICD-10-CM

## 2017-09-07 DIAGNOSIS — S199XXA Unspecified injury of neck, initial encounter: Secondary | ICD-10-CM | POA: Diagnosis not present

## 2017-09-07 HISTORY — DX: Malignant neoplasm of unspecified part of unspecified bronchus or lung: C34.90

## 2017-09-07 LAB — TSH: TSH: 1.069 u[IU]/mL (ref 0.350–4.500)

## 2017-09-07 LAB — CBC WITH DIFFERENTIAL/PLATELET
Basophils Absolute: 0 10*3/uL (ref 0.0–0.1)
Basophils Relative: 0 %
Eosinophils Absolute: 0 10*3/uL (ref 0.0–0.7)
Eosinophils Relative: 0 %
HCT: 54.6 % — ABNORMAL HIGH (ref 39.0–52.0)
Hemoglobin: 17.8 g/dL — ABNORMAL HIGH (ref 13.0–17.0)
Lymphocytes Relative: 5 %
Lymphs Abs: 1.3 10*3/uL (ref 0.7–4.0)
MCH: 29.1 pg (ref 26.0–34.0)
MCHC: 32.6 g/dL (ref 30.0–36.0)
MCV: 89.2 fL (ref 78.0–100.0)
Monocytes Absolute: 1.6 10*3/uL — ABNORMAL HIGH (ref 0.1–1.0)
Monocytes Relative: 7 %
Neutro Abs: 20.7 10*3/uL — ABNORMAL HIGH (ref 1.7–7.7)
Neutrophils Relative %: 88 %
Platelets: 53 10*3/uL — ABNORMAL LOW (ref 150–400)
RBC: 6.12 MIL/uL — ABNORMAL HIGH (ref 4.22–5.81)
RDW: 16.1 % — ABNORMAL HIGH (ref 11.5–15.5)
WBC: 23.6 10*3/uL — ABNORMAL HIGH (ref 4.0–10.5)

## 2017-09-07 LAB — CK: Total CK: 1265 U/L — ABNORMAL HIGH (ref 49–397)

## 2017-09-07 LAB — BASIC METABOLIC PANEL
Anion gap: 14 (ref 5–15)
BUN: 21 mg/dL — ABNORMAL HIGH (ref 6–20)
CO2: 23 mmol/L (ref 22–32)
Calcium: 9.5 mg/dL (ref 8.9–10.3)
Chloride: 101 mmol/L (ref 101–111)
Creatinine, Ser: 1.27 mg/dL — ABNORMAL HIGH (ref 0.61–1.24)
GFR calc Af Amer: >60 mL/min (ref >60)
GFR calc non Af Amer: 56 mL/min — ABNORMAL LOW (ref >60)
Glucose, Bld: 155 mg/dL — ABNORMAL HIGH (ref 65–99)
Potassium: 4.3 mmol/L (ref 3.5–5.1)
Sodium: 138 mmol/L (ref 135–145)

## 2017-09-07 LAB — TROPONIN I
Troponin I: 0.05 ng/mL (ref <0.03)
Troponin I: 0.06 ng/mL (ref <0.03)
Troponin I: 0.07 ng/mL (ref <0.03)

## 2017-09-07 LAB — PROTIME-INR
INR: 5.15
Prothrombin Time: 47.3 seconds — ABNORMAL HIGH (ref 11.4–15.2)

## 2017-09-07 LAB — BLOOD GAS, VENOUS
Acid-base deficit: 1.1 mmol/L (ref 0.0–2.0)
Bicarbonate: 23 mmol/L (ref 20.0–28.0)
O2 Content: 6 L/min
O2 Saturation: 85.4 %
Patient temperature: 37
pCO2, Ven: 41.1 mmHg — ABNORMAL LOW (ref 44.0–60.0)
pH, Ven: 7.374 (ref 7.250–7.430)
pO2, Ven: 53.5 mmHg — ABNORMAL HIGH (ref 32.0–45.0)

## 2017-09-07 LAB — BRAIN NATRIURETIC PEPTIDE: B Natriuretic Peptide: 63 pg/mL (ref 0.0–100.0)

## 2017-09-07 LAB — MAGNESIUM: Magnesium: 2 mg/dL (ref 1.7–2.4)

## 2017-09-07 LAB — URINALYSIS, ROUTINE W REFLEX MICROSCOPIC
Bilirubin Urine: NEGATIVE
Glucose, UA: NEGATIVE mg/dL
Hgb urine dipstick: NEGATIVE
Ketones, ur: 5 mg/dL — AB
Leukocytes, UA: NEGATIVE
Nitrite: NEGATIVE
Protein, ur: 100 mg/dL — AB
Specific Gravity, Urine: 1.025 (ref 1.005–1.030)
pH: 5 (ref 5.0–8.0)

## 2017-09-07 LAB — LACTIC ACID, PLASMA
Lactic Acid, Venous: 2.3 mmol/L (ref 0.5–1.9)
Lactic Acid, Venous: 1.6 mmol/L (ref 0.5–1.9)

## 2017-09-07 MED ORDER — SODIUM CHLORIDE 0.9 % IV SOLN
INTRAVENOUS | Status: DC
  Administered 2017-09-07 – 2017-09-10 (×6): via INTRAVENOUS

## 2017-09-07 MED ORDER — ACETAMINOPHEN 325 MG PO TABS
650.0000 mg | ORAL_TABLET | Freq: Four times a day (QID) | ORAL | Status: DC | PRN
  Administered 2017-09-07: 650 mg via ORAL
  Filled 2017-09-07 (×3): qty 2

## 2017-09-07 MED ORDER — LEVALBUTEROL HCL 0.63 MG/3ML IN NEBU: 0.6300 mg | INHALATION_SOLUTION | Freq: Four times a day (QID) | RESPIRATORY_TRACT | Status: DC | PRN

## 2017-09-07 MED ORDER — ALBUTEROL SULFATE (2.5 MG/3ML) 0.083% IN NEBU
2.5000 mg | INHALATION_SOLUTION | Freq: Once | RESPIRATORY_TRACT | Status: AC
  Administered 2017-09-07: 2.5 mg via RESPIRATORY_TRACT
  Filled 2017-09-07: qty 3

## 2017-09-07 MED ORDER — OXYCODONE HCL 5 MG PO TABS
5.0000 mg | ORAL_TABLET | ORAL | Status: DC | PRN
  Administered 2017-09-07 – 2017-09-15 (×6): 5 mg via ORAL
  Filled 2017-09-07 (×7): qty 1

## 2017-09-07 MED ORDER — SODIUM CHLORIDE 0.9 % IV BOLUS (SEPSIS)
500.0000 mL | Freq: Once | INTRAVENOUS | Status: AC
  Administered 2017-09-07: 500 mL via INTRAVENOUS

## 2017-09-07 MED ORDER — MORPHINE SULFATE (PF) 2 MG/ML IV SOLN
2.0000 mg | INTRAVENOUS | Status: AC | PRN
  Administered 2017-09-07 (×2): 2 mg via INTRAVENOUS
  Filled 2017-09-07 (×2): qty 1

## 2017-09-07 MED ORDER — TIOTROPIUM BROMIDE MONOHYDRATE 18 MCG IN CAPS
18.0000 ug | ORAL_CAPSULE | Freq: Every day | RESPIRATORY_TRACT | Status: DC
  Administered 2017-09-08: 18 ug via RESPIRATORY_TRACT
  Filled 2017-09-07: qty 5

## 2017-09-07 MED ORDER — ACETAMINOPHEN 650 MG RE SUPP: 650.0000 mg | Freq: Four times a day (QID) | RECTAL | Status: DC | PRN

## 2017-09-07 MED ORDER — LEVALBUTEROL HCL 1.25 MG/0.5ML IN NEBU
1.2500 mg | INHALATION_SOLUTION | Freq: Four times a day (QID) | RESPIRATORY_TRACT | Status: DC | PRN
  Administered 2017-09-08: 1.25 mg via RESPIRATORY_TRACT
  Filled 2017-09-07: qty 0.5

## 2017-09-07 MED ORDER — ACETAMINOPHEN 650 MG RE SUPP
650.0000 mg | Freq: Once | RECTAL | Status: AC
  Administered 2017-09-07: 650 mg via RECTAL
  Filled 2017-09-07: qty 1

## 2017-09-07 MED ORDER — VANCOMYCIN HCL 10 G IV SOLR
1500.0000 mg | Freq: Once | INTRAVENOUS | Status: AC
  Administered 2017-09-07: 1500 mg via INTRAVENOUS
  Filled 2017-09-07: qty 1500

## 2017-09-07 MED ORDER — IPRATROPIUM-ALBUTEROL 0.5-2.5 (3) MG/3ML IN SOLN
3.0000 mL | Freq: Once | RESPIRATORY_TRACT | Status: AC
  Administered 2017-09-07: 3 mL via RESPIRATORY_TRACT
  Filled 2017-09-07: qty 3

## 2017-09-07 MED ORDER — HYDRALAZINE HCL 20 MG/ML IJ SOLN: 5.0000 mg | INTRAMUSCULAR | Status: DC | PRN

## 2017-09-07 MED ORDER — ONDANSETRON HCL 4 MG/2ML IJ SOLN
4.0000 mg | Freq: Four times a day (QID) | INTRAMUSCULAR | Status: DC | PRN
  Administered 2017-09-09: 4 mg via INTRAVENOUS
  Filled 2017-09-07: qty 2

## 2017-09-07 MED ORDER — PIPERACILLIN-TAZOBACTAM 3.375 G IVPB 30 MIN
3.3750 g | INTRAVENOUS | Status: AC
  Administered 2017-09-07: 3.375 g via INTRAVENOUS
  Filled 2017-09-07: qty 50

## 2017-09-07 MED ORDER — ONDANSETRON HCL 4 MG PO TABS: 4.0000 mg | ORAL_TABLET | Freq: Four times a day (QID) | ORAL | Status: DC | PRN

## 2017-09-07 MED ORDER — SODIUM CHLORIDE 0.9 % IV BOLUS (SEPSIS)
250.0000 mL | Freq: Once | INTRAVENOUS | Status: AC
  Administered 2017-09-07: 250 mL via INTRAVENOUS

## 2017-09-07 MED ORDER — MORPHINE SULFATE (PF) 2 MG/ML IV SOLN
2.0000 mg | Freq: Once | INTRAVENOUS | Status: AC
  Administered 2017-09-07: 2 mg via INTRAVENOUS
  Filled 2017-09-07: qty 1

## 2017-09-07 MED ORDER — VANCOMYCIN HCL 10 G IV SOLR
1250.0000 mg | Freq: Two times a day (BID) | INTRAVENOUS | Status: DC
  Administered 2017-09-08 – 2017-09-09 (×3): 1250 mg via INTRAVENOUS
  Filled 2017-09-07 (×5): qty 1250

## 2017-09-07 MED ORDER — PIPERACILLIN-TAZOBACTAM 3.375 G IVPB
3.3750 g | Freq: Three times a day (TID) | INTRAVENOUS | Status: DC
  Administered 2017-09-08 – 2017-09-12 (×12): 3.375 g via INTRAVENOUS
  Filled 2017-09-07 (×13): qty 50

## 2017-09-07 NOTE — Progress Notes (Signed)
Pharmacy Antibiotic Note  Travis Cruz is a 71 y.o. male admitted on 09/07/2017 with sepsis.  Pharmacy has been consulted for vancomycin and zosyn dosing. -WBC= 23.6, tmax= 100.6, SCr= 1.27 and CrCl ~ 60 -vancomycin 1500mg  IV given in ED at 3pm  Plan: -Continue Zosyn 3.375gm IV q8h -Vancomycin 1250mg  IV q12h -Will follow renal function, cultures and clinical progress    Height: 5\' 9"  (175.3 cm) Weight: 218 lb (98.9 kg) IBW/kg (Calculated) : 70.7  Temp (24hrs), Avg:99.1 F (37.3 C), Min:98.1 F (36.7 C), Max:100.6 F (38.1 C)   Recent Labs Lab 09/07/17 0919 09/07/17 1008 09/07/17 1116 09/07/17 1233  WBC  --   --  23.6*  --   CREATININE 1.27*  --   --   --   LATICACIDVEN  --  2.3*  --  1.6    Estimated Creatinine Clearance: 63.7 mL/min (A) (by C-G formula based on SCr of 1.27 mg/dL (H)).    Allergies  Allergen Reactions  . Ativan [Lorazepam] Anxiety and Other (See Comments)    Hyper  [name]    Antimicrobials this admission: 9/10 zosyn 9/10 vanc  Dose adjustments this admission:  Microbiology results: 9/20 MRSA PCR 9/10 blood x2 9/10 urine   Thank you for allowing pharmacy to be a part of this patient's care.  Hildred Laser, Pharm D 09/07/2017 10:43 PM

## 2017-09-07 NOTE — ED Triage Notes (Signed)
Pt c/o weakness this am and fell when transferring from wheelchair to commode, pt wife states he was getting dressed in the bedroom this am and fell. Pt confused, low O2 level upon EMS arrival.

## 2017-09-07 NOTE — ED Notes (Signed)
CRITICAL VALUE ALERT  Critical Value:  0.05  Date & Time Notied:  1027  Provider Notified: Dr. Thurnell Garbe  Orders Received/Actions taken:

## 2017-09-07 NOTE — Progress Notes (Signed)
Pharmacy Note:  Antibiotics in ED  Initial antibiotics for Zosyn and Vancomycin ordered by EDP for sepsis.  Estimated Creatinine Clearance: 63.7 mL/min (A) (by C-G formula based on SCr of 1.27 mg/dL (H)).   Allergies  Allergen Reactions  . Ativan [Lorazepam] Anxiety and Other (See Comments)    Hyper  [name]    Vitals:   09/07/17 1258 09/07/17 1400  BP:  126/80  Pulse:  (!) 116  Resp:  20  Temp:    SpO2: 94% 94%    Anti-infectives    Start     Dose/Rate Route Frequency Ordered Stop   09/07/17 1500  vancomycin (VANCOCIN) 1,500 mg in sodium chloride 0.9 % 500 mL IVPB     1,500 mg 250 mL/hr over 120 Minutes Intravenous  Once 09/07/17 1411     09/07/17 1415  piperacillin-tazobactam (ZOSYN) IVPB 3.375 g     3.375 g 100 mL/hr over 30 Minutes Intravenous STAT 09/07/17 1411 09/08/17 1415      Plan: Initial doses of Vancomycin 1500mg  and Zosyn 3.375gm X 1 ordered to be given in ED. F/U admission orders for further dosing if therapy continued.  Ena Dawley, [facility] 09/07/2017 2:13 PM

## 2017-09-07 NOTE — ED Notes (Signed)
Swelling and bruising to left foot, skin tear left elbow and skin tears between right middle/ring and right ring/pinky finger.

## 2017-09-07 NOTE — Progress Notes (Signed)
Orthopedic paged per order
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
## 2017-09-07 NOTE — ED Notes (Signed)
Went in to administer pain meds, pt now says that he does not want any meds at this time as his pain is controlled

## 2017-09-07 NOTE — H&P (Signed)
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 changes. No evidence of traumatic injury to the cervical spine. Mild degenerative changes. Electronically Signed   By: Julian Hy M.D.   On: 09/07/2017 11:21   Dg Chest Portable 1 View  Result Date: 09/07/2017 CLINICAL DATA:  Weakness and low O2 sats. He fell transferring from wheelchair to commode this morning. EXAM: PORTABLE CHEST 1 VIEW COMPARISON:  02/17/2017 FINDINGS: Heart is enlarged. Patient has right-sided AICD with leads to the right ventricle, stable in appearance. There is left mid lung zone atelectasis or scarring, stable in appearance. There is mild bibasilar atelectasis. Mildly prominent interstitial markings are suspicious for mild interstitial edema. No overt alveolar edema. No consolidations or effusions. IMPRESSION: 1. Cardiomegaly and probable mild interstitial edema. 2. Left mid lung zone scarring and bibasilar atelectasis. Electronically Signed   By: Nolon Nations M.D.   On: 09/07/2017 09:58   Dg Knee Complete 4 Views Right  Result Date: 09/07/2017 CLINICAL DATA:  Multiple falls EXAM: RIGHT  KNEE - COMPLETE 4+ VIEW COMPARISON:  None. FINDINGS: Frontal, lateral, and bilateral oblique views were obtained. There are screws transfixing the proximal tibial metaphysis. There is no appreciable  tibial plateau depression. No acute fracture or dislocation. No appreciable joint effusion. Joint spaces appear unremarkable. There is slight chondrocalcinosis medially. There is a benign exostosis arising from the distal medial femoral metaphysis measuring 2.1 x 1.2 cm. There is popliteal artery atherosclerosis. IMPRESSION: Postoperative change proximal tibia. No tibial plateau depression. No acute fracture or dislocation. No joint effusion. No appreciable joint space narrowing. Benign-appearing exostosis along the medial distal femoral metaphysis. Mild chondrocalcinosis medially, a finding that may be seen with osteoarthritis or calcium pyrophosphate deposition disease. Popliteal artery atherosclerosis. Electronically Signed   By: Lowella Grip III M.D.   On: 09/07/2017 11:12   Dg Hand Complete Right  Result Date: 09/07/2017 CLINICAL DATA:  Hand pain and swelling and bruising secondary to multiple recent falls. Acute left hip fracture. EXAM: RIGHT HAND - COMPLETE 3+ VIEW COMPARISON:  None. FINDINGS: There is no evidence of fracture or dislocation. There is osteoarthritis primarily of the IP joints of the digits. Old deformity of the fifth metacarpal consistent with a remote healed fracture. Soft tissues are unremarkable. IMPRESSION: No acute abnormalities. Electronically Signed   By: Lorriane Shire M.D.   On: 09/07/2017 11:15   Dg Foot 2 Views Left  Result Date: 09/07/2017 CLINICAL DATA:  71 year old male with a history of multiple falls and foot pain EXAM: LEFT FOOT - 2 VIEW COMPARISON:  None. FINDINGS: Advanced osteopenia. No displaced fracture identified. Note that portions of the calcaneus have been excluded on the lateral view. Degenerative changes of the hindfoot, midfoot, forefoot. No focal soft  tissue swelling. No radiopaque foreign body. Partially imaged surgical changes of the fibula. IMPRESSION: No acute bony abnormality identified, however, advanced osteopenia somewhat limits detection of nondisplaced fractures. Degenerative changes of the hindfoot, midfoot, forefoot. Electronically Signed   By: Corrie Mckusick D.O.   On: 09/07/2017 11:12   Dg Hip Unilat With Pelvis 2-3 Views Left  Result Date: 09/07/2017 CLINICAL DATA:  Fall on Saturday and again today. Severe left hip pain. EXAM: DG HIP (WITH OR WITHOUT PELVIS) 2-3V LEFT COMPARISON:  None. FINDINGS: Osteopenia. There is a a mildly comminuted and slightly displaced fracture of the intertrochanteric left femur. Resultant varus angulation. No additional evidence of an acute fracture. Degenerative changes in the spine. IMPRESSION: Left intertrochanteric femur fracture. Electronically Signed   By: Lorin Picket M.D.   On: 09/07/2017 11:11      EKG: Independently reviewed.   Assessment/Plan Principal Problem:   Hip fracture requiring operative repair [facility]) Deemed high risk from a surgical standpoint Transferred to [facility] for cardiology clearance, as per orthopedic recommendation Hold anticoagulation Pre-Op 2-D echo As per ED documentation, orthopedics Dr. Ninfa Linden was notified by Dr.McManus    Ischemic cardiomyopathy-appears to be compensated  most recent 2-D echo was 07/17/2016, EF 50-55%    Deep vein thrombosis (DVT) (HCC)/PE-Coumadin on hold due to supratherapeutic INR Transition to heparin drip once INR less than 2.0  Leukocytosis, possible sepsis Patient's white count has been elevated status postsplenectomy However given low-grade fever this is above his baseline Will, empirically for sepsis  Dehydration -CK 1265 Continue IV fluids, lactate has normalized     HLD (hyperlipidemia) hold statin in the setting of elevated CK      Obstructive sleep apnea uses CPap at night with oxygen    COPD (chronic obstructive  pulmonary disease) (HCC)-stable without exacerbation Chest x-ray shows cardiomegaly, left midlung zone scarring     DVT prophylaxis: Coumadin      CODE STATUS full code  consults called: Cardiology, orthopedics  Family Communication: Admission, patients condition and plan of care including tests being ordered have been discussed with the patient  who indicates understanding and agree with the plan and Code Status  Admission status: inpatient    Disposition plan: Further plan will depend as patient's clinical course evolves and further radiologic and laboratory data become available. Likely home when stable   At the time of admission, it appears that the appropriate admission status for this patient is INPATIENT .Thisis judged to be reasonable and necessary in order to provide the required intensity of service to ensure the patient's safetygiven thepresenting symptoms, physical exam findings, and initial radiographic and laboratory data in the context of their chronic comorbidities.   Reyne Dumas MD [facility] Pager [phone]  If 7PM-7AM, please contact night-coverage [email or web address] Password [email or web address]  09/07/2017, 4:07 PM
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
## 2017-09-07 NOTE — ED Notes (Signed)
CRITICAL VALUE ALERT  Critical Value:  Lactic Acid 2.3  Date & Time Notied:  4585  Provider Notified: dr. Thurnell Garbe  Orders Received/Actions taken:

## 2017-09-08 ENCOUNTER — Ambulatory Visit (HOSPITAL_COMMUNITY): Payer: BLUE CROSS/BLUE SHIELD

## 2017-09-08 ENCOUNTER — Inpatient Hospital Stay (HOSPITAL_COMMUNITY): Payer: BLUE CROSS/BLUE SHIELD

## 2017-09-08 ENCOUNTER — Other Ambulatory Visit (HOSPITAL_COMMUNITY): Payer: BLUE CROSS/BLUE SHIELD

## 2017-09-08 DIAGNOSIS — I10 Essential (primary) hypertension: Secondary | ICD-10-CM

## 2017-09-08 DIAGNOSIS — S72001C Fracture of unspecified part of neck of right femur, initial encounter for open fracture type IIIA, IIIB, or IIIC: Secondary | ICD-10-CM

## 2017-09-08 DIAGNOSIS — I255 Ischemic cardiomyopathy: Secondary | ICD-10-CM

## 2017-09-08 DIAGNOSIS — R791 Abnormal coagulation profile: Secondary | ICD-10-CM

## 2017-09-08 DIAGNOSIS — S72002A Fracture of unspecified part of neck of left femur, initial encounter for closed fracture: Secondary | ICD-10-CM

## 2017-09-08 DIAGNOSIS — I248 Other forms of acute ischemic heart disease: Secondary | ICD-10-CM

## 2017-09-08 DIAGNOSIS — I5043 Acute on chronic combined systolic (congestive) and diastolic (congestive) heart failure: Secondary | ICD-10-CM

## 2017-09-08 DIAGNOSIS — I2510 Atherosclerotic heart disease of native coronary artery without angina pectoris: Secondary | ICD-10-CM

## 2017-09-08 DIAGNOSIS — Z01810 Encounter for preprocedural cardiovascular examination: Secondary | ICD-10-CM

## 2017-09-08 DIAGNOSIS — E785 Hyperlipidemia, unspecified: Secondary | ICD-10-CM

## 2017-09-08 DIAGNOSIS — Z95810 Presence of automatic (implantable) cardiac defibrillator: Secondary | ICD-10-CM

## 2017-09-08 LAB — CBC
HCT: 47.3 % (ref 39.0–52.0)
HCT: 50.6 % (ref 39.0–52.0)
Hemoglobin: 15.3 g/dL (ref 13.0–17.0)
Hemoglobin: 16.4 g/dL (ref 13.0–17.0)
MCH: 29 pg (ref 26.0–34.0)
MCH: 29 pg (ref 26.0–34.0)
MCHC: 32.3 g/dL (ref 30.0–36.0)
MCHC: 32.4 g/dL (ref 30.0–36.0)
MCV: 89.6 fL (ref 78.0–100.0)
MCV: 89.6 fL (ref 78.0–100.0)
Platelets: UNDETERMINED 10*3/uL (ref 150–400)
Platelets: UNDETERMINED 10*3/uL (ref 150–400)
RBC: 5.28 MIL/uL (ref 4.22–5.81)
RBC: 5.65 MIL/uL (ref 4.22–5.81)
RDW: 16.4 % — ABNORMAL HIGH (ref 11.5–15.5)
RDW: 16.7 % — ABNORMAL HIGH (ref 11.5–15.5)
WBC: 17.8 10*3/uL — ABNORMAL HIGH (ref 4.0–10.5)
WBC: 16.8 10*3/uL — ABNORMAL HIGH (ref 4.0–10.5)

## 2017-09-08 LAB — BASIC METABOLIC PANEL
Anion gap: 9 (ref 5–15)
BUN: 15 mg/dL (ref 6–20)
CO2: 21 mmol/L — ABNORMAL LOW (ref 22–32)
Calcium: 8.4 mg/dL — ABNORMAL LOW (ref 8.9–10.3)
Chloride: 109 mmol/L (ref 101–111)
Creatinine, Ser: 1.02 mg/dL (ref 0.61–1.24)
GFR calc Af Amer: >60 mL/min (ref >60)
GFR calc non Af Amer: >60 mL/min (ref >60)
Glucose, Bld: 89 mg/dL (ref 65–99)
Potassium: 4.3 mmol/L (ref 3.5–5.1)
Sodium: 139 mmol/L (ref 135–145)

## 2017-09-08 LAB — COMPREHENSIVE METABOLIC PANEL
ALT: 30 U/L (ref 17–63)
AST: 85 U/L — ABNORMAL HIGH (ref 15–41)
Albumin: 2.8 g/dL — ABNORMAL LOW (ref 3.5–5.0)
Alkaline Phosphatase: 87 U/L (ref 38–126)
Anion gap: 8 (ref 5–15)
BUN: 16 mg/dL (ref 6–20)
CO2: 23 mmol/L (ref 22–32)
Calcium: 8.4 mg/dL — ABNORMAL LOW (ref 8.9–10.3)
Chloride: 107 mmol/L (ref 101–111)
Creatinine, Ser: 1.17 mg/dL (ref 0.61–1.24)
GFR calc Af Amer: >60 mL/min (ref >60)
GFR calc non Af Amer: >60 mL/min (ref >60)
Glucose, Bld: 94 mg/dL (ref 65–99)
Potassium: 4.7 mmol/L (ref 3.5–5.1)
Sodium: 138 mmol/L (ref 135–145)
Total Bilirubin: 1.5 mg/dL — ABNORMAL HIGH (ref 0.3–1.2)
Total Protein: 5.9 g/dL — ABNORMAL LOW (ref 6.5–8.1)

## 2017-09-08 LAB — URINE CULTURE: Culture: NO GROWTH

## 2017-09-08 LAB — ECHOCARDIOGRAM COMPLETE
Area-P 1/2: 5.37 cm2
E decel time: 141 msec
FS: 38 % (ref 28–44)
Height: 69 in
IVS/LV PW RATIO, ED: 1
LA ID, A-P, ES: 35 mm
LA diam end sys: 35 mm
LA diam index: 1.57 cm/m2
LA vol A4C: 44.4 ml
LA vol index: 20.2 mL/m2
LA vol: 45 mL
LV PW d: 11 mm — AB (ref 0.6–1.1)
LVOT SV: 58 mL
LVOT VTI: 18.4 cm
LVOT area: 3.14 cm2
LVOT diameter: 20 mm
LVOT peak grad rest: 4 mmHg
LVOT peak vel: 104 cm/s
MV Dec: 141
MV Peak grad: 5 mmHg
MV pk E vel: 112 m/s
P 1/2 time: 41 ms
Weight: 3488 oz

## 2017-09-08 LAB — PROTIME-INR
INR: 4.38
INR: 4.63
Prothrombin Time: 40.1 seconds — ABNORMAL HIGH (ref 11.4–15.2)
Prothrombin Time: 42.6 seconds — ABNORMAL HIGH (ref 11.4–15.2)

## 2017-09-08 LAB — TROPONIN I: Troponin I: 0.07 ng/mL (ref <0.03)

## 2017-09-08 LAB — MRSA PCR SCREENING: MRSA by PCR: NEGATIVE

## 2017-09-08 MED ORDER — IPRATROPIUM BROMIDE 0.02 % IN SOLN: 0.5000 mg | Freq: Four times a day (QID) | RESPIRATORY_TRACT | Status: DC

## 2017-09-08 MED ORDER — METOPROLOL TARTRATE 5 MG/5ML IV SOLN
INTRAVENOUS | Status: AC
  Administered 2017-09-08: 5 mg via INTRAVENOUS
  Filled 2017-09-08: qty 5

## 2017-09-08 MED ORDER — PERFLUTREN LIPID MICROSPHERE
1.0000 mL | INTRAVENOUS | Status: AC | PRN
  Administered 2017-09-08: 5 mL via INTRAVENOUS
  Filled 2017-09-08: qty 10

## 2017-09-08 MED ORDER — LEVALBUTEROL HCL 1.25 MG/0.5ML IN NEBU: 1.2500 mg | INHALATION_SOLUTION | Freq: Four times a day (QID) | RESPIRATORY_TRACT | Status: DC

## 2017-09-08 MED ORDER — IPRATROPIUM BROMIDE 0.02 % IN SOLN
0.5000 mg | Freq: Two times a day (BID) | RESPIRATORY_TRACT | Status: DC
  Administered 2017-09-08 – 2017-09-18 (×20): 0.5 mg via RESPIRATORY_TRACT
  Filled 2017-09-08 (×20): qty 2.5

## 2017-09-08 MED ORDER — LORATADINE 10 MG PO TABS
10.0000 mg | ORAL_TABLET | Freq: Every day | ORAL | Status: DC
  Administered 2017-09-08 – 2017-09-18 (×10): 10 mg via ORAL
  Filled 2017-09-08 (×12): qty 1

## 2017-09-08 MED ORDER — METOPROLOL TARTRATE 5 MG/5ML IV SOLN
5.0000 mg | Freq: Once | INTRAVENOUS | Status: AC
  Administered 2017-09-08: 5 mg via INTRAVENOUS
  Filled 2017-09-08: qty 5

## 2017-09-08 MED ORDER — ADULT MULTIVITAMIN W/MINERALS CH
1.0000 | ORAL_TABLET | Freq: Every day | ORAL | Status: DC
  Administered 2017-09-08 – 2017-09-18 (×9): 1 via ORAL
  Filled 2017-09-08 (×10): qty 1

## 2017-09-08 MED ORDER — FUROSEMIDE 10 MG/ML IJ SOLN
40.0000 mg | Freq: Two times a day (BID) | INTRAMUSCULAR | Status: DC
  Administered 2017-09-08: 40 mg via INTRAVENOUS
  Filled 2017-09-08: qty 4

## 2017-09-08 MED ORDER — METOPROLOL TARTRATE 5 MG/5ML IV SOLN: 5.0000 mg | Freq: Once | INTRAVENOUS | Status: AC

## 2017-09-08 MED ORDER — LEVETIRACETAM 500 MG PO TABS
500.0000 mg | ORAL_TABLET | Freq: Two times a day (BID) | ORAL | Status: DC
  Administered 2017-09-08 – 2017-09-18 (×19): 500 mg via ORAL
  Filled 2017-09-08 (×21): qty 1

## 2017-09-08 MED ORDER — METOPROLOL TARTRATE 5 MG/5ML IV SOLN
5.0000 mg | Freq: Four times a day (QID) | INTRAVENOUS | Status: DC
  Administered 2017-09-08 – 2017-09-09 (×3): 5 mg via INTRAVENOUS
  Filled 2017-09-08 (×3): qty 5

## 2017-09-08 MED ORDER — ARFORMOTEROL TARTRATE 15 MCG/2ML IN NEBU
15.0000 ug | INHALATION_SOLUTION | Freq: Two times a day (BID) | RESPIRATORY_TRACT | Status: DC
  Administered 2017-09-08 – 2017-09-18 (×21): 15 ug via RESPIRATORY_TRACT
  Filled 2017-09-08 (×21): qty 2

## 2017-09-08 MED ORDER — LEVALBUTEROL HCL 1.25 MG/0.5ML IN NEBU
1.2500 mg | INHALATION_SOLUTION | Freq: Two times a day (BID) | RESPIRATORY_TRACT | Status: DC
  Administered 2017-09-08 – 2017-09-18 (×21): 1.25 mg via RESPIRATORY_TRACT
  Filled 2017-09-08 (×21): qty 0.5

## 2017-09-08 MED ORDER — BUDESONIDE 0.25 MG/2ML IN SUSP
0.2500 mg | Freq: Two times a day (BID) | RESPIRATORY_TRACT | Status: DC
  Administered 2017-09-08 – 2017-09-18 (×21): 0.25 mg via RESPIRATORY_TRACT
  Filled 2017-09-08 (×21): qty 2

## 2017-09-08 MED ORDER — IPRATROPIUM BROMIDE 0.02 % IN SOLN: 0.5000 mg | RESPIRATORY_TRACT | Status: DC | PRN

## 2017-09-08 MED ORDER — LEVALBUTEROL HCL 1.25 MG/0.5ML IN NEBU: 1.2500 mg | INHALATION_SOLUTION | RESPIRATORY_TRACT | Status: DC | PRN

## 2017-09-08 MED ORDER — IPRATROPIUM BROMIDE 0.02 % IN SOLN: 0.5000 mg | Freq: Two times a day (BID) | RESPIRATORY_TRACT | Status: DC

## 2017-09-08 MED ORDER — PANTOPRAZOLE SODIUM 40 MG PO TBEC
40.0000 mg | DELAYED_RELEASE_TABLET | Freq: Every day | ORAL | Status: DC
  Administered 2017-09-08 – 2017-09-12 (×5): 40 mg via ORAL
  Filled 2017-09-08 (×6): qty 1

## 2017-09-08 NOTE — Progress Notes (Signed)
Orthopedic Tech Progress Note Patient Details:  Travis Cruz 10/15/1946 [id number]  Musculoskeletal Traction Type of Traction: [place]ucks Skin Traction Traction Location: lle Traction Weight: 10 lbs    Hildred Priest 09/08/2017, 12:07 PM

## 2017-09-08 NOTE — Progress Notes (Signed)
Pt is on NIV QHS tolerating it well.

## 2017-09-08 NOTE — Progress Notes (Signed)
Patient refusing to wear bipap for sleep tonight. Patient requesting to only wear O2 [place]. Explained to patient the risk of low oxygen saturations d/t sleep apnea patient states understanding. Spouse at bedside.

## 2017-09-08 NOTE — Progress Notes (Signed)
PROGRESS NOTE    Travis Cruz  UXL:[id number] DOB: 1946-11-15 DOA: 09/07/2017 PCP: Timmothy Euler, MD    Brief Narrative:  Patient is a 71 year old gentleman history of complex cardiac history with coronary artery disease, status post ICD, CHF, COPD, prior history of PE/DVT who presents after a mechanical fall secondary to loss of balance and noted to have a complex left hip/proximal femur fracture. Orthopedics consulted. Cardiology consulted for preop clearance. INR noted to be supratherapeutic Coumadin on hold.   Assessment & Plan:   Principal Problem:   Hip fracture requiring operative repair [facility]) Active Problems:   Deep vein thrombosis (DVT) (H[facility])   HTN (hypertension)   NSTEMI (non-ST elevated myocardial infarction) ([place])   Ischemic cardiomyopathy   HLD (hyperlipidemia)   CAD (coronary atherosclerotic disease)   Obstructive sleep apnea   COPD (chronic obstructive pulmonary disease) ([place])   History of DVT (deep vein thrombosis)   Hip fracture ([place])  #1 complex left hip/proximal femur fracture Patient with a complex fracture. Patient has been assessed by orthopedics. Orthopedics has consulted a trauma specialist for further evaluation. Patient with a supratherapeutic INR. 2-D echo has been done. Cardiology has been consulted for preoperative clearance as patient is likely high risk due to complex cardiac issues.  #2 Hx of PE/DVT and CVA Patient on chronic anticoagulation. INR supratherapeutic. INR slowly trending down. Once INR less than 2 will likely need to be placed on IV heparin perioperatively.  #3 coronary artery disease/status post V. fib arrest with CPR severe single-vessel coronary artery disease with old inferior MI Patient denies any recent chest pain. 2-D echo has been done. Continue IV beta blocker, per cardiology.  #4 COPD Stable. Patient with no wheezing. Will try to optimize pulmonary function by placing on Pulmicort, Brovana, scheduled Xopenex and  Atrovent nebs.  #5 tachycardia Place on beta blocker. Per cardiology.  #6 leukocytosis Likely reactive leukocytosis. Concern for possible infectious etiology as patient is status post splenectomy. Urinalysis nitrite negative leukocytes negative. Chest x-ray with no acute infiltrate. Urine culture negative. Blood cultures pending. MRSA PCR negative. Continue empiric IV vancomycin and IV Zosyn for now. Follow.  #7 dehydration IV fluids.  #8 obstructive sleep apnea CPAP QHS   DVT prophylaxis: Supratherapeutic INR Code Status: Full Family Communication: Updated patient. No family at bedside. Disposition Plan: Pending surgical repair likely skilled nursing facility.   Consultants:   Orthopedics: Dr. Ninfa Linden 09/08/2017  Cardiology: Dr. Meda Coffee 09/08/2017  Procedures:   2-D echo 09/08/2017  CT head 09/07/2017  CT C-spine 09/07/2017  Plain films of the left ankle 09/07/2017  Films of the left foot 09/07/2017  Plain films of the right hand 09/07/2017  Plain films of the left hip and pelvis 09/07/2017  Plain films of the left knee 09/07/2017  Chest x-ray 09/07/2017  Plain films of the right knee 09/07/2017  Plain films of The left femur 09/08/2017  Antimicrobials:   IV vancomycin 09/07/2017  IV Zosyn 09/07/2017   Subjective: Patient laying in bed complaining of left lower extremity pain. No nausea or emesis. No chest pain.  Objective: Vitals:   09/08/17 0812 09/08/17 0830 09/08/17 0942 09/08/17 1157  BP: 125/81 125/81 (!) 155/97 (!) 142/90  Pulse: (!) 114 (!) 119 (!) 117 (!) 122  Resp: (!) 22 (!) 21 18 (!) 22  Temp:  98.6 F (37 C)  97.7 F (36.5 C)  TempSrc:  Axillary  Oral  SpO2: 94% 92% 96% 92%  Weight:      Height:  Intake/Output Summary (Last 24 hours) at 09/08/17 1233 Last data filed at 09/08/17 0336  Gross per 24 hour  Intake          3136.67 ml  Output             1450 ml  Net          1686.67 ml   Filed Weights   09/07/17 0910    Weight: 98.9 kg (218 lb)    Examination:  General exam: Appears calm and comfortable  Respiratory system: Clear to auscultation anterior lung fields. No wheezing, no crackles, no rhonchi.[name] Respiratory effort normal. Cardiovascular system: Tachycardia. S1 & S2 heard, RRR. No JVD, murmurs, rubs, gallops or clicks. No pedal edema. Gastrointestinal system: Abdomen is nondistended, soft and nontender. No organomegaly or masses felt. Normal bowel sounds heard. Central nervous system: Alert and oriented. No focal neurological deficits. Extremities: Left lower extremity in Buck's traction. Skin: No rashes, lesions or ulcers Psychiatry: Judgement and insight appear normal. Mood & affect appropriate.     Data Reviewed: I have personally reviewed following labs and imaging studies  CBC:  Recent Labs Lab 09/07/17 1116 09/08/17 0233 09/08/17 1157  WBC 23.6* 17.8* 16.8*  NEUTROABS 20.7*  --   --   HGB 17.8* 15.3 16.4  HCT 54.6* 47.3 50.6  MCV 89.2 89.6 89.6  PLT 53* PLATELET CLUMPS NOTED ON SMEAR, UNABLE TO ESTIMATE PENDING   Basic Metabolic Panel:  Recent Labs Lab 09/07/17 0919 09/07/17 1412 09/08/17 0233 09/08/17 0809  NA 138  --  138 139  K 4.3  --  4.7 4.3  CL 101  --  107 109  CO2 23  --  23 21*  GLUCOSE 155*  --  94 89  BUN 21*  --  16 15  CREATININE 1.27*  --  1.17 1.02  CALCIUM 9.5  --  8.4* 8.4*  MG  --  2.0  --   --    GFR: Estimated Creatinine Clearance: 79.3 mL/min (by C-G formula based on SCr of 1.02 mg/dL). Liver Function Tests:  Recent Labs Lab 09/08/17 0233  AST 85*  ALT 30  ALKPHOS 87  BILITOT 1.5*  PROT 5.9*  ALBUMIN 2.8*   No results for input(s): LIPASE, AMYLASE in the last 168 hours. No results for input(s): AMMONIA in the last 168 hours. Coagulation Profile:  Recent Labs Lab 09/07/17 1116 09/08/17 0233 09/08/17 0809  INR 5.15* 4.38* 4.63*   Cardiac Enzymes:  Recent Labs Lab 09/07/17 0919 09/07/17 1116 09/07/17 1412  09/07/17 2043 09/08/17 0233  CKTOTAL  --  1,265*  --   --   --   TROPONINI 0.05*  --  0.06* 0.07* 0.07*   BNP (last 3 results) No results for input(s): PROBNP in the last 8760 hours. HbA1C: No results for input(s): HGBA1C in the last 72 hours. CBG: No results for input(s): GLUCAP in the last 168 hours. Lipid Profile: No results for input(s): CHOL, HDL, LDLCALC, TRIG, CHOLHDL, LDLDIRECT in the last 72 hours. Thyroid Function Tests:  Recent Labs  09/07/17 1000  TSH 1.069   Anemia Panel: No results for input(s): VITAMINB12, FOLATE, FERRITIN, TIBC, IRON, RETICCTPCT in the last 72 hours. Sepsis Labs:  Recent Labs Lab 09/07/17 1008 09/07/17 1233  LATICACIDVEN 2.3* 1.6    Recent Results (from the past 240 hour(s))  Urine culture     Status: None   Collection Time: 09/07/17  9:53 AM  Result Value Ref Range Status   Specimen Description URINE,  CLEAN CATCH  Final   Special Requests NONE  Final   Culture   Final    NO GROWTH Performed at [facility], [place] [address], [place], [place] [address]    Report Status 09/08/2017 FINAL  Final  Culture, blood (routine x 2)     Status: None (Preliminary result)   Collection Time: 09/07/17 11:17 AM  Result Value Ref Range Status   Specimen Description BLOOD RIGHT WRIST  Final   Special Requests   Final    BOTTLES DRAWN AEROBIC AND ANAEROBIC Blood Culture adequate volume   Culture NO GROWTH < 24 HOURS  Final   Report Status PENDING  Incomplete  Culture, blood (routine x 2)     Status: None (Preliminary result)   Collection Time: 09/07/17 12:33 PM  Result Value Ref Range Status   Specimen Description BLOOD RIGHT ARM  Final   Special Requests   Final    BOTTLES DRAWN AEROBIC ONLY Blood Culture adequate volume   Culture NO GROWTH < 24 HOURS  Final   Report Status PENDING  Incomplete  MRSA PCR Screening     Status: None   Collection Time: 09/07/17 10:34 PM  Result Value Ref Range Status   MRSA by PCR NEGATIVE NEGATIVE Final     Comment:        The GeneXpert MRSA Assay (FDA approved for NASAL specimens only), is one component of a comprehensive MRSA colonization surveillance program. It is not intended to diagnose MRSA infection nor to guide or monitor treatment for MRSA infections.          Radiology Studies: Dg Knee 1-2 Views Left  Result Date: 09/07/2017 CLINICAL DATA:  Multiple recent falls with knee pain, initial encounter EXAM: LEFT KNEE - 2 VIEW COMPARISON:  09/19/2015 FINDINGS: Fixation sideplate is again identified along the distal femur and proximal tibia. The overall appearance is stable. Interval progressive healing in the distal femur is noted. No acute fracture or dislocation is seen. No joint effusion is noted. IMPRESSION: Chronic changes consistent with prior fracture and fixation. Some progressive healing is noted when compared with the prior study. No acute abnormality is noted. Electronically Signed   By: Inez Catalina M.D.   On: 09/07/2017 11:11   Dg Ankle 2 Views Left  Result Date: 09/07/2017 CLINICAL DATA:  Left ankle pain secondary to multiple recent falls. Acute left hip fracture. EXAM: LEFT ANKLE - 2 VIEW COMPARISON:  None. FINDINGS: There is no acute fracture or dislocation. There is a healed fracture of the distal fibula with a sideplate and multiple screws in place in the fibula. There is diffuse osteopenia. No appreciable joint effusion. IMPRESSION: No appreciable acute abnormalities. Electronically Signed   By: Lorriane Shire M.D.   On: 09/07/2017 11:13   Ct Head Wo Contrast  Result Date: 09/07/2017 CLINICAL DATA:  Weakness, fall EXAM: CT HEAD WITHOUT CONTRAST CT CERVICAL SPINE WITHOUT CONTRAST TECHNIQUE: Multidetector CT imaging of the head and cervical spine was performed following the standard protocol without intravenous contrast. Multiplanar CT image reconstructions of the cervical spine were also generated. COMPARISON:  CT head dated 06/23/2013 FINDINGS: CT HEAD FINDINGS Brain:  No evidence of acute infarction, hemorrhage, hydrocephalus, extra-axial collection or mass lesion/mass effect. Old right parietal and left parieto-occipital infarcts. Old left cerebellar lacunar infarct. Subcortical white matter and periventricular small vessel ischemic changes. Global cortical atrophy. Vascular: Intracranial atherosclerosis. Skull: Normal. Negative for fracture or focal lesion. Sinuses/Orbits: The visualized paranasal sinuses are essentially clear. The mastoid  air cells are unopacified. Other: None. CT CERVICAL SPINE FINDINGS Alignment: Reversal of the normal mid cervical lordosis. Skull base and vertebrae: No acute fracture. No primary bone lesion or focal pathologic process. Soft tissues and spinal canal: No prevertebral fluid or swelling. No visible canal hematoma. Disc levels:  Mild degenerative changes at C5-6. Spinal canal is patent. Upper chest: Visualized lung apices are notable for paraseptal emphysematous changes. Other: Visualized thyroid is unremarkable. IMPRESSION: No evidence of acute intracranial abnormality. Multiple old infarcts, as above. Atrophy with small vessel ischemic changes. No evidence of traumatic injury to the cervical spine. Mild degenerative changes. Electronically Signed   By: Julian Hy M.D.   On: 09/07/2017 11:21   Ct Cervical Spine Wo Contrast  Result Date: 09/07/2017 CLINICAL DATA:  Weakness, fall EXAM: CT HEAD WITHOUT CONTRAST CT CERVICAL SPINE WITHOUT CONTRAST TECHNIQUE: Multidetector CT imaging of the head and cervical spine was performed following the standard protocol without intravenous contrast. Multiplanar CT image reconstructions of the cervical spine were also generated. COMPARISON:  CT head dated 06/23/2013 FINDINGS: CT HEAD FINDINGS Brain: No evidence of acute infarction, hemorrhage, hydrocephalus, extra-axial collection or mass lesion/mass effect. Old right parietal and left parieto-occipital infarcts. Old left cerebellar lacunar infarct.  Subcortical white matter and periventricular small vessel ischemic changes. Global cortical atrophy. Vascular: Intracranial atherosclerosis. Skull: Normal. Negative for fracture or focal lesion. Sinuses/Orbits: The visualized paranasal sinuses are essentially clear. The mastoid air cells are unopacified. Other: None. CT CERVICAL SPINE FINDINGS Alignment: Reversal of the normal mid cervical lordosis. Skull base and vertebrae: No acute fracture. No primary bone lesion or focal pathologic process. Soft tissues and spinal canal: No prevertebral fluid or swelling. No visible canal hematoma. Disc levels:  Mild degenerative changes at C5-6. Spinal canal is patent. Upper chest: Visualized lung apices are notable for paraseptal emphysematous changes. Other: Visualized thyroid is unremarkable. IMPRESSION: No evidence of acute intracranial abnormality. Multiple old infarcts, as above. Atrophy with small vessel ischemic changes. No evidence of traumatic injury to the cervical spine. Mild degenerative changes. Electronically Signed   By: Julian Hy M.D.   On: 09/07/2017 11:21   Dg Chest Portable 1 View  Result Date: 09/07/2017 CLINICAL DATA:  Weakness and low O2 sats. He fell transferring from wheelchair to commode this morning. EXAM: PORTABLE CHEST 1 VIEW COMPARISON:  02/17/2017 FINDINGS: Heart is enlarged. Patient has right-sided AICD with leads to the right ventricle, stable in appearance. There is left mid lung zone atelectasis or scarring, stable in appearance. There is mild bibasilar atelectasis. Mildly prominent interstitial markings are suspicious for mild interstitial edema. No overt alveolar edema. No consolidations or effusions. IMPRESSION: 1. Cardiomegaly and probable mild interstitial edema. 2. Left mid lung zone scarring and bibasilar atelectasis. Electronically Signed   By: Nolon Nations M.D.   On: 09/07/2017 09:58   Dg Knee Complete 4 Views Right  Result Date: 09/07/2017 CLINICAL DATA:  Multiple  falls EXAM: RIGHT KNEE - COMPLETE 4+ VIEW COMPARISON:  None. FINDINGS: Frontal, lateral, and bilateral oblique views were obtained. There are screws transfixing the proximal tibial metaphysis. There is no appreciable tibial plateau depression. No acute fracture or dislocation. No appreciable joint effusion. Joint spaces appear unremarkable. There is slight chondrocalcinosis medially. There is a benign exostosis arising from the distal medial femoral metaphysis measuring 2.1 x 1.2 cm. There is popliteal artery atherosclerosis. IMPRESSION: Postoperative change proximal tibia. No tibial plateau depression. No acute fracture or dislocation. No joint effusion. No appreciable joint space narrowing. Benign-appearing exostosis along  the medial distal femoral metaphysis. Mild chondrocalcinosis medially, a finding that may be seen with osteoarthritis or calcium pyrophosphate deposition disease. Popliteal artery atherosclerosis. Electronically Signed   By: Lowella Grip III M.D.   On: 09/07/2017 11:12   Dg Hand Complete Right  Result Date: 09/07/2017 CLINICAL DATA:  Hand pain and swelling and bruising secondary to multiple recent falls. Acute left hip fracture. EXAM: RIGHT HAND - COMPLETE 3+ VIEW COMPARISON:  None. FINDINGS: There is no evidence of fracture or dislocation. There is osteoarthritis primarily of the IP joints of the digits. Old deformity of the fifth metacarpal consistent with a remote healed fracture. Soft tissues are unremarkable. IMPRESSION: No acute abnormalities. Electronically Signed   By: Lorriane Shire M.D.   On: 09/07/2017 11:15   Dg Foot 2 Views Left  Result Date: 09/07/2017 CLINICAL DATA:  71 year old male with a history of multiple falls and foot pain EXAM: LEFT FOOT - 2 VIEW COMPARISON:  None. FINDINGS: Advanced osteopenia. No displaced fracture identified. Note that portions of the calcaneus have been excluded on the lateral view. Degenerative changes of the hindfoot, midfoot, forefoot.  No focal soft tissue swelling. No radiopaque foreign body. Partially imaged surgical changes of the fibula. IMPRESSION: No acute bony abnormality identified, however, advanced osteopenia somewhat limits detection of nondisplaced fractures. Degenerative changes of the hindfoot, midfoot, forefoot. Electronically Signed   By: Corrie Mckusick D.O.   On: 09/07/2017 11:12   Dg Hip Unilat With Pelvis 2-3 Views Left  Result Date: 09/07/2017 CLINICAL DATA:  Fall on Saturday and again today. Severe left hip pain. EXAM: DG HIP (WITH OR WITHOUT PELVIS) 2-3V LEFT COMPARISON:  None. FINDINGS: Osteopenia. There is a a mildly comminuted and slightly displaced fracture of the intertrochanteric left femur. Resultant varus angulation. No additional evidence of an acute fracture. Degenerative changes in the spine. IMPRESSION: Left intertrochanteric femur fracture. Electronically Signed   By: Lorin Picket M.D.   On: 09/07/2017 11:11   Dg Femur Port Min 2 Views Left  Result Date: 09/08/2017 CLINICAL DATA:  Hip fracture. EXAM: LEFT FEMUR PORTABLE 2 VIEWS COMPARISON:  09/07/2017. FINDINGS: Left intertrochanteric hip fractures again noted. Mild angulation deformity noted. Similar findings noted on prior exam. Plate and screw fixation of the distal femur and proximal tibia noted. Stable deformity of the distal femur. Stable soft tissue ossification. IMPRESSION: 1. Right intertrochanteric hip fracture, similar findings noted on prior study of 09/07/2017. 2. Stable appearing postsurgical changes distal femur and proximal tibia P Electronically Signed   By: [place]   On: 09/08/2017 10:58        Scheduled Meds: . arformoterol  15 mcg Nebulization BID  . budesonide (PULMICORT) nebulizer solution  0.25 mg Nebulization BID  . ipratropium  0.5 mg Nebulization Q6H  . levalbuterol  1.25 mg Nebulization Q6H  . loratadine  10 mg Oral Daily  . pantoprazole  40 mg Oral Q0600   Continuous Infusions: . sodium chloride 100  mL/hr at 09/08/17 0922  . piperacillin-tazobactam (ZOSYN)  IV Stopped (09/08/17 0949)  . vancomycin Stopped (09/08/17 0447)     LOS: 1 day    Time spent: 66 minutes    THOMPSON,DANIEL, MD [facility] Pager [phone]  If 7PM-7AM, please contact night-coverage [email or web address] Password [facility] 09/08/2017, 12:34 PM

## 2017-09-08 NOTE — Progress Notes (Signed)
MD notified pt HR 120s all morning. New order received for 5mg  lopressor IV and to consult cardiology for further management of rate.

## 2017-09-08 NOTE — Progress Notes (Signed)
Patient on bipap tolerating well. PRN breathing treatment given d/t low O2 saturations. Patient maintaining 88-92% at this time. No distress noted will continue to monitor
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
## 2017-09-08 NOTE — Progress Notes (Signed)
Patient requiring multiple interventions for coached breathing to maintain adequate oxygen saturations. Patient tolerating cpap machine with home settings with supplemental oxygen. elink md suggested adjusting settings of cpap and increasing o2 saturations, if this does not work then transition patient to bipap.

## 2017-09-08 NOTE — Progress Notes (Signed)
*  PRELIMINARY RESULTS* Echocardiogram 2D Echocardiogram has been performed with [facility].  Travis Cruz 09/08/2017, 3:22 PM
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
## 2017-09-08 NOTE — Progress Notes (Signed)
MD notified of INR 4.63

## 2017-09-08 NOTE — Plan of Care (Signed)
Problem: Physical Regulation: Goal: Ability to maintain clinical measurements within normal limits will improve Outcome: Not Progressing Patient having difficulty maintaining oxygen saturations   Problem: Tissue Perfusion: Goal: Risk factors for ineffective tissue perfusion will decrease Outcome: Not Progressing Patient needs reinforcement and coached breathing to help increase oxygen saturations

## 2017-09-08 NOTE — Progress Notes (Signed)
Traction in place. 10 lbs to the left hip. Sandbag is to be placed on bed for transport.
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
## 2017-09-08 NOTE — Progress Notes (Signed)
New orders received for PRN lopressor for HR.

## 2017-09-09 ENCOUNTER — Inpatient Hospital Stay (HOSPITAL_COMMUNITY): Payer: BLUE CROSS/BLUE SHIELD

## 2017-09-09 ENCOUNTER — Encounter (HOSPITAL_COMMUNITY): Payer: Self-pay | Admitting: *Deleted

## 2017-09-09 ENCOUNTER — Inpatient Hospital Stay (HOSPITAL_COMMUNITY): Payer: BLUE CROSS/BLUE SHIELD | Admitting: Certified Registered Nurse Anesthetist

## 2017-09-09 ENCOUNTER — Encounter (HOSPITAL_COMMUNITY)
Admission: EM | Disposition: A | Discharge status: Skilled Nursing Facility | Payer: Self-pay | Source: Home / Self Care | Attending: Family Medicine

## 2017-09-09 ENCOUNTER — Encounter: Payer: BLUE CROSS/BLUE SHIELD | Admitting: *Deleted

## 2017-09-09 DIAGNOSIS — I482 Chronic atrial fibrillation: Secondary | ICD-10-CM

## 2017-09-09 DIAGNOSIS — D72829 Elevated white blood cell count, unspecified: Secondary | ICD-10-CM | POA: Diagnosis not present

## 2017-09-09 DIAGNOSIS — R748 Abnormal levels of other serum enzymes: Secondary | ICD-10-CM

## 2017-09-09 HISTORY — PX: FEMUR IM NAIL: SHX1597

## 2017-09-09 LAB — HEPATIC FUNCTION PANEL
ALT: 26 U/L (ref 17–63)
AST: 50 U/L — ABNORMAL HIGH (ref 15–41)
Albumin: 2.6 g/dL — ABNORMAL LOW (ref 3.5–5.0)
Alkaline Phosphatase: 85 U/L (ref 38–126)
Bilirubin, Direct: 0.3 mg/dL (ref 0.1–0.5)
Indirect Bilirubin: 1 mg/dL — ABNORMAL HIGH (ref 0.3–0.9)
Total Bilirubin: 1.3 mg/dL — ABNORMAL HIGH (ref 0.3–1.2)
Total Protein: 6 g/dL — ABNORMAL LOW (ref 6.5–8.1)

## 2017-09-09 LAB — CBC WITH DIFFERENTIAL/PLATELET
Basophils Absolute: 0 10*3/uL (ref 0.0–0.1)
Basophils Relative: 0 %
Eosinophils Absolute: 0.5 10*3/uL (ref 0.0–0.7)
Eosinophils Relative: 3 %
HCT: 46.4 % (ref 39.0–52.0)
Hemoglobin: 14.6 g/dL (ref 13.0–17.0)
Lymphocytes Relative: 10 %
Lymphs Abs: 1.8 10*3/uL (ref 0.7–4.0)
MCH: 28.3 pg (ref 26.0–34.0)
MCHC: 31.5 g/dL (ref 30.0–36.0)
MCV: 90.1 fL (ref 78.0–100.0)
Monocytes Absolute: 2.4 10*3/uL — ABNORMAL HIGH (ref 0.1–1.0)
Monocytes Relative: 13 %
Neutro Abs: 13.4 10*3/uL — ABNORMAL HIGH (ref 1.7–7.7)
Neutrophils Relative %: 74 %
Platelets: UNDETERMINED 10*3/uL (ref 150–400)
RBC: 5.15 MIL/uL (ref 4.22–5.81)
RDW: 16.5 % — ABNORMAL HIGH (ref 11.5–15.5)
WBC: 18.1 10*3/uL — ABNORMAL HIGH (ref 4.0–10.5)

## 2017-09-09 LAB — TYPE AND SCREEN
ABO/RH(D): O POS
Antibody Screen: NEGATIVE

## 2017-09-09 LAB — PROTIME-INR
INR: 2.93
Prothrombin Time: 30.3 seconds — ABNORMAL HIGH (ref 11.4–15.2)

## 2017-09-09 SURGERY — INSERTION, INTRAMEDULLARY ROD, FEMUR
Anesthesia: General | Site: Hip | Laterality: Left

## 2017-09-09 MED ORDER — LIDOCAINE HCL (CARDIAC) 20 MG/ML IV SOLN
INTRAVENOUS | Status: DC | PRN
  Administered 2017-09-09: 60 mg via INTRAVENOUS

## 2017-09-09 MED ORDER — ALBUTEROL SULFATE (2.5 MG/3ML) 0.083% IN NEBU
INHALATION_SOLUTION | RESPIRATORY_TRACT | Status: AC
  Filled 2017-09-09: qty 3

## 2017-09-09 MED ORDER — MEPERIDINE HCL 25 MG/ML IJ SOLN: 6.2500 mg | INTRAMUSCULAR | Status: DC | PRN

## 2017-09-09 MED ORDER — POVIDONE-IODINE 10 % EX SWAB: 2.0000 "application " | Freq: Once | CUTANEOUS | Status: DC

## 2017-09-09 MED ORDER — ROCURONIUM BROMIDE 10 MG/ML (PF) SYRINGE
PREFILLED_SYRINGE | INTRAVENOUS | Status: AC
  Filled 2017-09-09: qty 5

## 2017-09-09 MED ORDER — METOPROLOL TARTRATE 5 MG/5ML IV SOLN
2.5000 mg | Freq: Once | INTRAVENOUS | Status: AC
  Administered 2017-09-09: 2.5 mg via INTRAVENOUS

## 2017-09-09 MED ORDER — CEFAZOLIN SODIUM-DEXTROSE 2-4 GM/100ML-% IV SOLN
2.0000 g | INTRAVENOUS | Status: AC
  Administered 2017-09-09: 2 g via INTRAVENOUS

## 2017-09-09 MED ORDER — PROPOFOL 10 MG/ML IV BOLUS
INTRAVENOUS | Status: AC
  Filled 2017-09-09: qty 20

## 2017-09-09 MED ORDER — FENTANYL CITRATE (PF) 100 MCG/2ML IJ SOLN
INTRAMUSCULAR | Status: AC
  Filled 2017-09-09: qty 2

## 2017-09-09 MED ORDER — ROCURONIUM BROMIDE 100 MG/10ML IV SOLN
INTRAVENOUS | Status: DC | PRN
  Administered 2017-09-09: 40 mg via INTRAVENOUS
  Administered 2017-09-09: 20 mg via INTRAVENOUS

## 2017-09-09 MED ORDER — LACTATED RINGERS IV SOLN
INTRAVENOUS | Status: DC
  Administered 2017-09-09: 12:00:00 via INTRAVENOUS

## 2017-09-09 MED ORDER — CEFAZOLIN SODIUM-DEXTROSE 2-4 GM/100ML-% IV SOLN
2.0000 g | Freq: Three times a day (TID) | INTRAVENOUS | Status: DC
  Filled 2017-09-09 (×2): qty 100

## 2017-09-09 MED ORDER — DEXAMETHASONE SODIUM PHOSPHATE 10 MG/ML IJ SOLN
INTRAMUSCULAR | Status: AC
  Filled 2017-09-09: qty 1

## 2017-09-09 MED ORDER — SUGAMMADEX SODIUM 200 MG/2ML IV SOLN
INTRAVENOUS | Status: AC
  Filled 2017-09-09: qty 2

## 2017-09-09 MED ORDER — ONDANSETRON HCL 4 MG/2ML IJ SOLN
INTRAMUSCULAR | Status: AC
  Filled 2017-09-09: qty 4

## 2017-09-09 MED ORDER — FENTANYL CITRATE (PF) 100 MCG/2ML IJ SOLN
25.0000 ug | INTRAMUSCULAR | Status: DC | PRN
  Administered 2017-09-09: 50 ug via INTRAVENOUS

## 2017-09-09 MED ORDER — METOPROLOL TARTRATE 5 MG/5ML IV SOLN
10.0000 mg | Freq: Four times a day (QID) | INTRAVENOUS | Status: DC
  Administered 2017-09-10: 5 mg via INTRAVENOUS
  Filled 2017-09-09 (×3): qty 10

## 2017-09-09 MED ORDER — FENTANYL CITRATE (PF) 250 MCG/5ML IJ SOLN
INTRAMUSCULAR | Status: AC
  Filled 2017-09-09: qty 5

## 2017-09-09 MED ORDER — PHENYLEPHRINE 40 MCG/ML (10ML) SYRINGE FOR IV PUSH (FOR BLOOD PRESSURE SUPPORT)
PREFILLED_SYRINGE | INTRAVENOUS | Status: AC
  Filled 2017-09-09: qty 20

## 2017-09-09 MED ORDER — SUGAMMADEX SODIUM 200 MG/2ML IV SOLN
INTRAVENOUS | Status: DC | PRN
  Administered 2017-09-09: 200 mg via INTRAVENOUS

## 2017-09-09 MED ORDER — PROPOFOL 10 MG/ML IV BOLUS
INTRAVENOUS | Status: DC | PRN
  Administered 2017-09-09: 100 mg via INTRAVENOUS

## 2017-09-09 MED ORDER — SODIUM CHLORIDE 0.9 % IV BOLUS (SEPSIS)
250.0000 mL | Freq: Once | INTRAVENOUS | Status: AC
  Administered 2017-09-09: 250 mL via INTRAVENOUS

## 2017-09-09 MED ORDER — PHENYLEPHRINE HCL 10 MG/ML IJ SOLN
INTRAMUSCULAR | Status: DC | PRN
  Administered 2017-09-09: 50 ug/min via INTRAVENOUS

## 2017-09-09 MED ORDER — MORPHINE SULFATE (PF) 4 MG/ML IV SOLN
2.0000 mg | INTRAVENOUS | Status: DC | PRN
  Administered 2017-09-09: 2 mg via INTRAVENOUS
  Filled 2017-09-09: qty 1

## 2017-09-09 MED ORDER — PROMETHAZINE HCL 25 MG/ML IJ SOLN: 6.2500 mg | INTRAMUSCULAR | Status: DC | PRN

## 2017-09-09 MED ORDER — ONDANSETRON HCL 4 MG/2ML IJ SOLN
INTRAMUSCULAR | Status: DC | PRN
  Administered 2017-09-09: 4 mg via INTRAVENOUS

## 2017-09-09 MED ORDER — FENTANYL CITRATE (PF) 100 MCG/2ML IJ SOLN
INTRAMUSCULAR | Status: DC | PRN
  Administered 2017-09-09 (×3): 50 ug via INTRAVENOUS

## 2017-09-09 MED ORDER — 0.9 % SODIUM CHLORIDE (POUR BTL) OPTIME
TOPICAL | Status: DC | PRN
  Administered 2017-09-09: 1000 mL

## 2017-09-09 MED ORDER — CHLORHEXIDINE GLUCONATE 4 % EX LIQD: 60.0000 mL | Freq: Once | CUTANEOUS | Status: DC

## 2017-09-09 MED ORDER — PHENYLEPHRINE HCL 10 MG/ML IJ SOLN
INTRAMUSCULAR | Status: DC | PRN
  Administered 2017-09-09 (×5): 80 ug via INTRAVENOUS

## 2017-09-09 MED ORDER — LACTATED RINGERS IV SOLN: INTRAVENOUS | Status: DC

## 2017-09-09 MED ORDER — ALBUTEROL SULFATE (2.5 MG/3ML) 0.083% IN NEBU
2.5000 mg | INHALATION_SOLUTION | Freq: Four times a day (QID) | RESPIRATORY_TRACT | Status: DC | PRN
  Administered 2017-09-09: 2.5 mg via RESPIRATORY_TRACT

## 2017-09-09 MED ORDER — LIDOCAINE 2% (20 MG/ML) 5 ML SYRINGE
INTRAMUSCULAR | Status: AC
  Filled 2017-09-09: qty 5

## 2017-09-09 SURGICAL SUPPLY — 48 items
BIT DRILL FLUTED FEMUR 4.2/3 (BIT) ×2 IMPLANT
BLADE TFNA HELICAL 115 (Anchor) ×2 IMPLANT
BNDG COHESIVE 6X5 TAN STRL LF (GAUZE/BANDAGES/DRESSINGS) ×2 IMPLANT
BRUSH SCRUB SURG 4.25 DISP (MISCELLANEOUS) ×4 IMPLANT
CHLORAPREP W/TINT 26ML (MISCELLANEOUS) ×2 IMPLANT
COVER PERINEAL POST (MISCELLANEOUS) ×2 IMPLANT
COVER SURGICAL LIGHT HANDLE (MISCELLANEOUS) ×4 IMPLANT
DRAPE C-ARMOR (DRAPES) ×2 IMPLANT
DRAPE HALF SHEET 40X57 (DRAPES) ×4 IMPLANT
DRAPE IMP U-DRAPE 54X76 (DRAPES) ×6 IMPLANT
DRAPE ORTHO SPLIT 77X108 STRL (DRAPES) ×3
DRAPE SURG ORHT 6 SPLT 77X108 (DRAPES) ×3 IMPLANT
DRAPE U-SHAPE 47X51 STRL (DRAPES) ×6 IMPLANT
DRSG MEPILEX BORDER 4X4 (GAUZE/BANDAGES/DRESSINGS) ×2 IMPLANT
DRSG MEPILEX BORDER 4X8 (GAUZE/BANDAGES/DRESSINGS) ×2 IMPLANT
ELECT REM PT RETURN 9FT ADLT (ELECTROSURGICAL) ×2
ELECTRODE REM PT RTRN 9FT ADLT (ELECTROSURGICAL) ×1 IMPLANT
GLOVE BIO SURGEON STRL SZ7.5 (GLOVE) ×8 IMPLANT
GLOVE BIOGEL PI IND STRL 7.5 (GLOVE) ×1 IMPLANT
GLOVE BIOGEL PI INDICATOR 7.5 (GLOVE) ×1
GOWN STRL REUS W/ TWL LRG LVL3 (GOWN DISPOSABLE) ×2 IMPLANT
GOWN STRL REUS W/TWL LRG LVL3 (GOWN DISPOSABLE) ×2
GUIDE PIN 3.2MM (PIN) ×2
GUIDE PIN ORTH 12X3.2X (PIN) ×2 IMPLANT
GUIDEWIRE 3.2X400 (WIRE) ×4 IMPLANT
IMPL DEG TI CANN 11MM/130 (Orthopedic Implant) ×1 IMPLANT
IMPLANT DEG TI CANN 11MM/130 (Orthopedic Implant) ×2 IMPLANT
KIT BASIN OR (CUSTOM PROCEDURE TRAY) ×2 IMPLANT
KIT ROOM TURNOVER OR (KITS) ×2 IMPLANT
MANIFOLD NEPTUNE II (INSTRUMENTS) ×2 IMPLANT
NS IRRIG 1000ML POUR BTL (IV SOLUTION) ×2 IMPLANT
PACK GENERAL/GYN (CUSTOM PROCEDURE TRAY) ×2 IMPLANT
PAD ARMBOARD 7.5X6 YLW CONV (MISCELLANEOUS) ×4 IMPLANT
SCREW CANN LOCK TI FT 5X42 (Screw) ×2 IMPLANT
STAPLER VISISTAT 35W (STAPLE) ×2 IMPLANT
STOCKINETTE IMPERVIOUS LG (DRAPES) ×2 IMPLANT
SUT ETHILON 3 0 PS 1 (SUTURE) IMPLANT
SUT MNCRL AB 3-0 PS2 18 (SUTURE) ×2 IMPLANT
SUT MON AB 2-0 CT1 36 (SUTURE) IMPLANT
SUT VIC AB 0 CT1 27 (SUTURE)
SUT VIC AB 0 CT1 27XBRD ANBCTR (SUTURE) IMPLANT
SUT VIC AB 1 CT1 27 (SUTURE)
SUT VIC AB 1 CT1 27XBRD ANBCTR (SUTURE) IMPLANT
SUT VIC AB 2-0 CT1 27 (SUTURE) ×2
SUT VIC AB 2-0 CT1 TAPERPNT 27 (SUTURE) ×2 IMPLANT
TOWEL OR 17X24 6PK STRL BLUE (TOWEL DISPOSABLE) ×2 IMPLANT
TOWEL OR 17X26 10 PK STRL BLUE (TOWEL DISPOSABLE) ×4 IMPLANT
WATER STERILE IRR 1000ML POUR (IV SOLUTION) ×2 IMPLANT

## 2017-09-09 NOTE — Progress Notes (Signed)
Progress Note  Patient Name: Travis Cruz Date of Encounter: 09/09/2017  Primary Cardiologist: Percival Spanish, MD Primary Electrophysiologist:  Caryl Comes, MD  Subjective   He feels better, mild pain in his LLE, no SOB or chest pain.  Inpatient Medications    Scheduled Meds: . arformoterol  15 mcg Nebulization BID  . budesonide (PULMICORT) nebulizer solution  0.25 mg Nebulization BID  . furosemide  40 mg Intravenous BID  . ipratropium  0.5 mg Nebulization BID  . levalbuterol  1.25 mg Nebulization BID  . levETIRAcetam  500 mg Oral BID  . loratadine  10 mg Oral Daily  . metoprolol tartrate  5 mg Intravenous Q6H  . multivitamin with minerals  1 tablet Oral Daily  . pantoprazole  40 mg Oral Q0600   Continuous Infusions: . sodium chloride 100 mL/hr at 09/08/17 2300  . piperacillin-tazobactam (ZOSYN)  IV 3.375 g (09/09/17 2952)  . vancomycin Stopped (09/09/17 0516)   PRN Meds: acetaminophen **OR** acetaminophen, hydrALAZINE, ipratropium, levalbuterol, ondansetron **OR** ondansetron (ZOFRAN) IV, oxyCODONE   Vital Signs    Vitals:   09/09/17 0337 09/09/17 0400 09/09/17 0500 09/09/17 0802  BP: 134/80 127/74 132/84 117/77  Pulse: 96 (!) 109 (!) 107 99  Resp: (!) 21 (!) 21 20 (!) 22  Temp: (!) 97.5 F (36.4 C)   97.7 F (36.5 C)  TempSrc: Oral   Oral  SpO2: 97% 97% 92% 97%  Weight:      Height:        Intake/Output Summary (Last 24 hours) at 09/09/17 1012 Last data filed at 09/09/17 0552  Gross per 24 hour  Intake          1736.66 ml  Output             1400 ml  Net           336.66 ml   Filed Weights   09/07/17 0910  Weight: 218 lb (98.9 kg)    Telemetry    A-fib 92-110 BPM - Personally Reviewed  Physical Exam  GEN: No acute distress.   Neck: No JVD Cardiac: RRR, 3/6 systolic murmur, rubs, or gallops.  Respiratory: Clear to auscultation bilaterally. GI: Soft, nontender, non-distended  MS: minimal RLE edema; LLE in a sling, bluish discoloration and  swelling. Neuro:  Nonfocal  Psych: Normal affect   Labs    Chemistry Recent Labs Lab 09/07/17 0919 09/08/17 0233 09/08/17 0809 09/09/17 0827  NA 138 138 139  --   K 4.3 4.7 4.3  --   CL 101 107 109  --   CO2 23 23 21*  --   GLUCOSE 155* 94 89  --   BUN 21* 16 15  --   CREATININE 1.27* 1.17 1.02  --   CALCIUM 9.5 8.4* 8.4*  --   PROT  --  5.9*  --  6.0*  ALBUMIN  --  2.8*  --  2.6*  AST  --  85*  --  50*  ALT  --  30  --  26  ALKPHOS  --  87  --  85  BILITOT  --  1.5*  --  1.3*  GFRNONAA 56* >60 >60  --   GFRAA >60 >60 >60  --   ANIONGAP 14 8 9   --      Hematology Recent Labs Lab 09/08/17 0233 09/08/17 1157 09/09/17 0827  WBC 17.8* 16.8* 18.1*  RBC 5.28 5.65 5.15  HGB 15.3 16.4 14.6  HCT 47.3 50.6 46.4  MCV 89.6 89.6 90.1  MCH 29.0 29.0 28.3  MCHC 32.3 32.4 31.5  RDW 16.4* 16.7* 16.5*  PLT PLATELET CLUMPS NOTED ON SMEAR, UNABLE TO ESTIMATE PLATELET CLUMPS NOTED ON SMEAR, UNABLE TO ESTIMATE PLATELET CLUMPS NOTED ON SMEAR, UNABLE TO ESTIMATE    Cardiac Enzymes Recent Labs Lab 09/07/17 0919 09/07/17 1412 09/07/17 2043 09/08/17 0233  TROPONINI 0.05* 0.06* 0.07* 0.07*   No results for input(s): TROPIPOC in the last 168 hours.   BNP Recent Labs Lab 09/07/17 0919  BNP 63.0    DDimer No results for input(s): DDIMER in the last 168 hours.   Radiology    Dg Knee 1-2 Views Left  Result Date: 09/07/2017 CLINICAL DATA:  Multiple recent falls with knee pain, initial encounter EXAM: LEFT KNEE - 2 VIEW COMPARISON:  09/19/2015 FINDINGS: Fixation sideplate is again identified along the distal femur and proximal tibia. The overall appearance is stable. Interval progressive healing in the distal femur is noted. No acute fracture or dislocation is seen. No joint effusion is noted. IMPRESSION: Chronic changes consistent with prior fracture and fixation. Some progressive healing is noted when compared with the prior study. No acute abnormality is noted. Electronically  Signed   By: Inez Catalina M.D.   On: 09/07/2017 11:11   Dg Ankle 2 Views Left  Result Date: 09/07/2017 CLINICAL DATA:  Left ankle pain secondary to multiple recent falls. Acute left hip fracture. EXAM: LEFT ANKLE - 2 VIEW COMPARISON:  None. FINDINGS: There is no acute fracture or dislocation. There is a healed fracture of the distal fibula with a sideplate and multiple screws in place in the fibula. There is diffuse osteopenia. No appreciable joint effusion. IMPRESSION: No appreciable acute abnormalities. Electronically Signed   By: Lorriane Shire M.D.   On: 09/07/2017 11:13   Ct Head Wo Contrast  Result Date: 09/07/2017 CLINICAL DATA:  Weakness, fall EXAM: CT HEAD WITHOUT CONTRAST CT CERVICAL SPINE WITHOUT CONTRAST TECHNIQUE: Multidetector CT imaging of the head and cervical spine was performed following the standard protocol without intravenous contrast. Multiplanar CT image reconstructions of the cervical spine were also generated. COMPARISON:  CT head dated 06/23/2013 FINDINGS: CT HEAD FINDINGS Brain: No evidence of acute infarction, hemorrhage, hydrocephalus, extra-axial collection or mass lesion/mass effect. Old right parietal and left parieto-occipital infarcts. Old left cerebellar lacunar infarct. Subcortical white matter and periventricular small vessel ischemic changes. Global cortical atrophy. Vascular: Intracranial atherosclerosis. Skull: Normal. Negative for fracture or focal lesion. Sinuses/Orbits: The visualized paranasal sinuses are essentially clear. The mastoid air cells are unopacified. Other: None. CT CERVICAL SPINE FINDINGS Alignment: Reversal of the normal mid cervical lordosis. Skull base and vertebrae: No acute fracture. No primary bone lesion or focal pathologic process. Soft tissues and spinal canal: No prevertebral fluid or swelling. No visible canal hematoma. Disc levels:  Mild degenerative changes at C5-6. Spinal canal is patent. Upper chest: Visualized lung apices are notable  for paraseptal emphysematous changes. Other: Visualized thyroid is unremarkable. IMPRESSION: No evidence of acute intracranial abnormality. Multiple old infarcts, as above. Atrophy with small vessel ischemic changes. No evidence of traumatic injury to the cervical spine. Mild degenerative changes. Electronically Signed   By: Julian Hy M.D.   On: 09/07/2017 11:21   Ct Cervical Spine Wo Contrast  Result Date: 09/07/2017 CLINICAL DATA:  Weakness, fall EXAM: CT HEAD WITHOUT CONTRAST CT CERVICAL SPINE WITHOUT CONTRAST TECHNIQUE: Multidetector CT imaging of the head and cervical spine was performed following the standard protocol without intravenous contrast. Multiplanar CT image  reconstructions of the cervical spine were also generated. COMPARISON:  CT head dated 06/23/2013 FINDINGS: CT HEAD FINDINGS Brain: No evidence of acute infarction, hemorrhage, hydrocephalus, extra-axial collection or mass lesion/mass effect. Old right parietal and left parieto-occipital infarcts. Old left cerebellar lacunar infarct. Subcortical white matter and periventricular small vessel ischemic changes. Global cortical atrophy. Vascular: Intracranial atherosclerosis. Skull: Normal. Negative for fracture or focal lesion. Sinuses/Orbits: The visualized paranasal sinuses are essentially clear. The mastoid air cells are unopacified. Other: None. CT CERVICAL SPINE FINDINGS Alignment: Reversal of the normal mid cervical lordosis. Skull base and vertebrae: No acute fracture. No primary bone lesion or focal pathologic process. Soft tissues and spinal canal: No prevertebral fluid or swelling. No visible canal hematoma. Disc levels:  Mild degenerative changes at C5-6. Spinal canal is patent. Upper chest: Visualized lung apices are notable for paraseptal emphysematous changes. Other: Visualized thyroid is unremarkable. IMPRESSION: No evidence of acute intracranial abnormality. Multiple old infarcts, as above. Atrophy with small vessel  ischemic changes. No evidence of traumatic injury to the cervical spine. Mild degenerative changes. Electronically Signed   By: Julian Hy M.D.   On: 09/07/2017 11:21   Dg Knee Complete 4 Views Right  Result Date: 09/07/2017 CLINICAL DATA:  Multiple falls EXAM: RIGHT KNEE - COMPLETE 4+ VIEW COMPARISON:  None. FINDINGS: Frontal, lateral, and bilateral oblique views were obtained. There are screws transfixing the proximal tibial metaphysis. There is no appreciable tibial plateau depression. No acute fracture or dislocation. No appreciable joint effusion. Joint spaces appear unremarkable. There is slight chondrocalcinosis medially. There is a benign exostosis arising from the distal medial femoral metaphysis measuring 2.1 x 1.2 cm. There is popliteal artery atherosclerosis. IMPRESSION: Postoperative change proximal tibia. No tibial plateau depression. No acute fracture or dislocation. No joint effusion. No appreciable joint space narrowing. Benign-appearing exostosis along the medial distal femoral metaphysis. Mild chondrocalcinosis medially, a finding that may be seen with osteoarthritis or calcium pyrophosphate deposition disease. Popliteal artery atherosclerosis. Electronically Signed   By: Lowella Grip III M.D.   On: 09/07/2017 11:12   Dg Hand Complete Right  Result Date: 09/07/2017 CLINICAL DATA:  Hand pain and swelling and bruising secondary to multiple recent falls. Acute left hip fracture. EXAM: RIGHT HAND - COMPLETE 3+ VIEW COMPARISON:  None. FINDINGS: There is no evidence of fracture or dislocation. There is osteoarthritis primarily of the IP joints of the digits. Old deformity of the fifth metacarpal consistent with a remote healed fracture. Soft tissues are unremarkable. IMPRESSION: No acute abnormalities. Electronically Signed   By: Lorriane Shire M.D.   On: 09/07/2017 11:15   Dg Foot 2 Views Left  Result Date: 09/07/2017 CLINICAL DATA:  71 year old male with a history of multiple  falls and foot pain EXAM: LEFT FOOT - 2 VIEW COMPARISON:  None. FINDINGS: Advanced osteopenia. No displaced fracture identified. Note that portions of the calcaneus have been excluded on the lateral view. Degenerative changes of the hindfoot, midfoot, forefoot. No focal soft tissue swelling. No radiopaque foreign body. Partially imaged surgical changes of the fibula. IMPRESSION: No acute bony abnormality identified, however, advanced osteopenia somewhat limits detection of nondisplaced fractures. Degenerative changes of the hindfoot, midfoot, forefoot. Electronically Signed   By: Corrie Mckusick D.O.   On: 09/07/2017 11:12   Dg Hip Unilat With Pelvis 2-3 Views Left  Result Date: 09/07/2017 CLINICAL DATA:  Fall on Saturday and again today. Severe left hip pain. EXAM: DG HIP (WITH OR WITHOUT PELVIS) 2-3V LEFT COMPARISON:  None. FINDINGS: Osteopenia. There  is a a mildly comminuted and slightly displaced fracture of the intertrochanteric left femur. Resultant varus angulation. No additional evidence of an acute fracture. Degenerative changes in the spine. IMPRESSION: Left intertrochanteric femur fracture. Electronically Signed   By: Lorin Picket M.D.   On: 09/07/2017 11:11   Dg Femur Port Min 2 Views Left  Result Date: 09/08/2017 CLINICAL DATA:  Hip fracture. EXAM: LEFT FEMUR PORTABLE 2 VIEWS COMPARISON:  09/07/2017. FINDINGS: Left intertrochanteric hip fractures again noted. Mild angulation deformity noted. Similar findings noted on prior exam. Plate and screw fixation of the distal femur and proximal tibia noted. Stable deformity of the distal femur. Stable soft tissue ossification. IMPRESSION: 1. Right intertrochanteric hip fracture, similar findings noted on prior study of 09/07/2017. 2. Stable appearing postsurgical changes distal femur and proximal tibia P Electronically Signed   By: Marcello Moores  Register   On: 09/08/2017 10:58   Cardiac Studies   TTE: 09/08/2017  - Left ventricle: The cavity size was  normal. Wall thickness was   normal. Systolic function was normal. The estimated ejection   fraction was in the range of 55% to 60%. Wall motion was normal;   there were no regional wall motion abnormalities. - Left atrium: The atrium was mildly dilated. - Normal LV function; mild LAE.    Patient Profile     71 y.o. 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   After fall his EKG showed ectopic atrial tachycardia with a rate of 140. WBC 23,000, creatinine 1.27, Trop 0.05-0.07, lactic acid 2.3, UA negative, neg chest xray. Dr. Aline Brochure with Ortho recommended the patient be transferred to [facility] for surgery due to multiple comorbidities, Since arrival to the [facility] has been in the ICU. He has had some difficulty maintaining oxygen saturation is requiring PRN breathing treatments for sats dropping to 88-92%. Ortho is aware surgery is high risk from a medical standpoint. Travis Cruz says that his breathing at home had been better then usual and he had no been experiencing and chest pain or symptoms different from his baseline.  Plan:  His troponin elevation is minimal with flat trend in a settings of an acute trauma and sepsis. No ischemic workup is necessary. Repeat echo showed normal LVEF and no regional wall  motion abnormalities. The patient is on chronic home O2, he is now euvolemic, I would hold lasix. INR 2.9, scheduled for a surgery this afternoon. He is in a-fib with mildly elevated ventricular rates, I would increase metoprolol iv to 10 mg Q6H.  For questions or updates, please contact [place] Please consult [email or web address] for contact info under Cardiology/STEMI.   Signed, Ena Dawley, MD  09/09/2017, 10:12 AM

## 2017-09-09 NOTE — Progress Notes (Signed)
Patietn given 5mg  of Lopressor Iv with heart rate 114 and bp 115/70.
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
## 2017-09-09 NOTE — Progress Notes (Signed)
Patient ID: Travis Cruz, male   DOB: [birth year], 71 y.o.   MRN: [id number]   LOS: 2 days   Subjective: Doing well this am, traction helping with pain.   Objective: Vital signs in last 24 hours: Temp:  [97.5 F (36.4 C)-99.5 F (37.5 C)] 97.7 F (36.5 C) (09/12 0802) Pulse Rate:  [48-124] 99 (09/12 0802) Resp:  [18-29] 22 (09/12 0802) BP: (115-156)/(72-97) 117/77 (09/12 0802) SpO2:  [91 %-97 %] 97 % (09/12 0802) Last BM Date: 09/08/17   Laboratory  Lab Results  Component Value Date   INR 2.93 09/09/2017   INR 4.63 (HH) 09/08/2017   INR 4.38 (HH) 09/08/2017    Physical Exam General appearance: alert and no distress  LLE: Toes warm, sensation intact, strength grossly intact   Assessment/Plan: Fall Left intertroch hip fx -- Plan currently is for Dr. Doreatha Martin to perform IMN of hip fx this afternoon.  DVT/PE/PFO on coumadin -- INR<3 Multiple medical problems -- per primary team. Suspect surgery will be high risk from medical standpoint.    Lisette Abu, PA-C Orthopedic Surgery [phone] 09/09/2017
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
## 2017-09-09 NOTE — Anesthesia Procedure Notes (Addendum)
Procedure Name: Intubation Date/Time: 09/09/2017 12:32 PM Performed by: Salli Quarry Shatonya Passon Pre-anesthesia Checklist: Patient identified, Emergency Drugs available, Suction available and Patient being monitored Patient Re-evaluated:Patient Re-evaluated prior to induction Oxygen Delivery Method: Circle System Utilized Preoxygenation: Pre-oxygenation with 100% oxygen Induction Type: IV induction Ventilation: Mask ventilation without difficulty and Oral airway inserted - appropriate to patient size Laryngoscope Size: Mac and 3 Grade View: Grade I Tube type: Oral Tube size: 7.5 mm Number of attempts: 1 Airway Equipment and Method: Stylet and Oral airway Placement Confirmation: ETT inserted through vocal cords under direct vision,  positive ETCO2 and breath sounds checked- equal and bilateral Secured at: 24 cm Tube secured with: Tape Dental Injury: Teeth and Oropharynx as per pre-operative assessment  Comments: Intubation by Sherie Don, SRNA

## 2017-09-09 NOTE — Transfer of Care (Signed)
Immediate Anesthesia Transfer of Care Note  Patient: Travis Cruz  Procedure(s) Performed: Procedure(s): INTRAMEDULLARY (IM) NAIL FEMORAL (Left)  Patient Location: PACU  Anesthesia Type:General  Level of Consciousness: lethargic and responds to stimulation  Airway & Oxygen Therapy: Patient Spontanous Breathing and Patient connected to face mask oxygen  Post-op Assessment: Report given to RN and Post -op Vital signs reviewed and stable  Post vital signs: Reviewed and stable  Last Vitals:  Vitals:   09/09/17 0500 09/09/17 0802  BP: 132/84 117/77  Pulse: (!) 107 99  Resp: 20 (!) 22  Temp:  36.5 C  SpO2: 92% 97%    Last Pain:  Vitals:   09/09/17 0802  TempSrc: Oral  PainSc:          Complications: No apparent anesthesia complications

## 2017-09-09 NOTE — Progress Notes (Signed)
PROGRESS NOTE    Travis Cruz  RAQ:[id number] DOB: 27-Apr-1946 DOA: 09/07/2017 PCP: Timmothy Euler, MD   Specialists:     Brief Narrative:  81 CAD, prior V. fib arrest 2014-severe single-vessel CAD Ischemic cardiomyopathy with systolic and diastolic heart failure last EF 50-55% 06/2016-prior ICD placement Lung nodule which is malignant and prior pulmonary embolism COPD HTN OSA on CPAP  Admitted on 09/07/17 with complex hip fracture at [facility] and [facility] perioperative clearance by cardiology Found to have mild dehydration, mild rhabdomyolysis with CK of 1265 Mild leukocytosis with x-ray showing cardiomegaly and lung scarring Cardiology consulted, orthopedics consult and recommended trauma surgery evaluation given complexity of the case High risk for surgery  Assessment & Plan:   Principal Problem:   Hip fracture requiring operative repair [facility]) Active Problems:   Deep vein thrombosis (DVT) ([place])   HTN (hypertension)   NSTEMI (non-ST elevated myocardial infarction) ([place])   Ischemic cardiomyopathy   HLD (hyperlipidemia)   CAD (coronary atherosclerotic disease)   Obstructive sleep apnea   COPD (chronic obstructive pulmonary disease) ([place])   History of DVT (deep vein thrombosis)   Hip fracture ([place])   Closed left hip fracture, initial encounter ([place])   Supratherapeutic INR   complex left hip proximal femur fracture  Status post intramedullary nailing left 9/12  per discretion Ortho services-appreciate their input into   Anticoagulation post-op--the patient is already on chronic anticoagulation with Coumadin prior to admission because of DVT and CVA history--resume as per orthopedics when able  Weight bearing tolerance for therapy services  Wound care  Pain management  Follow-up services required  History of DVT and PE-also history of PFO 2012 and stroke  If INR drops below 2 will need heparin  INR  2.93  Leukocytosis 14 mm NSCLC stage I status post XRT 02/2017  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 fib arrest status post pacemaker 07/01/2013 CAD with single-vessel disease  Stable at this time-continue metoprolol 10 every 6 IV--? Apparently was not on a beta blocker as an outpatient?  COPD/OSA  His BiPAP at night if tolerated  Cont SpiriVA 18 MCG DAILY  2 GI bleeds in 2014 secondary to colonic polyp 06/30/2013-Dr. buccini  Careful with anticoagulation, keep the low range of therapeutic   Prior seizure disorder  Continue keppra 500 2 tabs twice a day  Left ankle pain  As persisting and ecchymosis will be ordering two-view ankle films to rule out occult fracture   DVT prophylaxis: TX ON COUMADIN Code Status: FULL Family Communication:  D/W WIFE Disposition Plan:  INPATIENT   Consultants:   ORTHO  CARDIO  Procedures:   Hip nialing L 9/12  Antimicrobials:   Vanc/Zopsyn--->Zosyn 9/12    Subjective: Alert pleasant pain is 8/10 in the hip that was operated on has not received pain meds on the floor Is ready to eat No chest pain No bleeding No chills  Objective: Vitals:   09/09/17 0300 09/09/17 0337 09/09/17 0400 09/09/17 0500  BP: 122/72 134/80 127/74 132/84  Pulse: (!) 105 96 (!) 109 (!) 107  Resp: 20 (!) 21 (!) 21 20  Temp:  (!) 97.5 F (36.4 C)    TempSrc:  Oral    SpO2: 94% 97% 97% 92%  Weight:      Height:  Intake/Output Summary (Last 24 hours) at 09/09/17 0736 Last data filed at 09/09/17 0552  Gross per 24 hour  Intake          2276.66 ml  Output             1400 ml  Net           876.66 ml   Filed Weights   09/07/17 0910  Weight: 98.9 kg (218 lb)    Examination: EOMI NCAT S1-S2 no murmur rub or gallop Chest clinically clear Abdomen nontender Left hip has bandage on it Left  ankle is swollen and uncomfortable joint line tenderness lateral malleolus   Data Reviewed: I have personally reviewed following labs and imaging studies  CBC:  Recent Labs Lab 09/07/17 1116 09/08/17 0233 09/08/17 1157  WBC 23.6* 17.8* 16.8*  NEUTROABS 20.7*  --   --   HGB 17.8* 15.3 16.4  HCT 54.6* 47.3 50.6  MCV 89.2 89.6 89.6  PLT 53* PLATELET CLUMPS NOTED ON SMEAR, UNABLE TO ESTIMATE PLATELET CLUMPS NOTED ON SMEAR, UNABLE TO ESTIMATE   Basic Metabolic Panel:  Recent Labs Lab 09/07/17 0919 09/07/17 1412 09/08/17 0233 09/08/17 0809  NA 138  --  138 139  K 4.3  --  4.7 4.3  CL 101  --  107 109  CO2 23  --  23 21*  GLUCOSE 155*  --  94 89  BUN 21*  --  16 15  CREATININE 1.27*  --  1.17 1.02  CALCIUM 9.5  --  8.4* 8.4*  MG  --  2.0  --   --    GFR: Estimated Creatinine Clearance: 79.3 mL/min (by C-G formula based on SCr of 1.02 mg/dL). Liver Function Tests:  Recent Labs Lab 09/08/17 0233  AST 85*  ALT 30  ALKPHOS 87  BILITOT 1.5*  PROT 5.9*  ALBUMIN 2.8*   No results for input(s): LIPASE, AMYLASE in the last 168 hours. No results for input(s): AMMONIA in the last 168 hours. Coagulation Profile:  Recent Labs Lab 09/07/17 1116 09/08/17 0233 09/08/17 0809 09/09/17 0405  INR 5.15* 4.38* 4.63* 2.93   Cardiac Enzymes:  Recent Labs Lab 09/07/17 0919 09/07/17 1116 09/07/17 1412 09/07/17 2043 09/08/17 0233  CKTOTAL  --  1,265*  --   --   --   TROPONINI 0.05*  --  0.06* 0.07* 0.07*   BNP (last 3 results) No results for input(s): PROBNP in the last 8760 hours. HbA1C: No results for input(s): HGBA1C in the last 72 hours. CBG: No results for input(s): GLUCAP in the last 168 hours. Lipid Profile: No results for input(s): CHOL, HDL, LDLCALC, TRIG, CHOLHDL, LDLDIRECT in the last 72 hours. Thyroid Function Tests:  Recent Labs  09/07/17 1000  TSH 1.069   Anemia Panel: No results for input(s): VITAMINB12, FOLATE, FERRITIN, TIBC, IRON, RETICCTPCT  in the last 72 hours. Urine analysis:    Component Value Date/Time   COLORURINE AMBER (A) 09/07/2017 0953   APPEARANCEUR CLOUDY (A) 09/07/2017 0953   APPEARANCEUR Clear 04/24/2016 0811   LABSPEC 1.025 09/07/2017 0953   PHURINE 5.0 09/07/2017 [place] 09/07/2017 0953   HGBUR NEGATIVE 09/07/2017 0953   BILIRUBINUR NEGATIVE 09/07/2017 0953   BILIRUBINUR Negative 04/24/2016 0811   KETONESUR 5 (A) 09/07/2017 0953   PROTEINUR 100 (A) 09/07/2017 0953   UROBILINOGEN 1.0 01/31/2013 1035   NITRITE NEGATIVE 09/07/2017 [place] 09/07/2017 0953   LEUKOCYTESUR Negative 04/24/2016 0811     Radiology  Studies: Reviewed images personally in health database    Scheduled Meds: . arformoterol  15 mcg Nebulization BID  . budesonide (PULMICORT) nebulizer solution  0.25 mg Nebulization BID  . furosemide  40 mg Intravenous BID  . ipratropium  0.5 mg Nebulization BID  . levalbuterol  1.25 mg Nebulization BID  . levETIRAcetam  500 mg Oral BID  . loratadine  10 mg Oral Daily  . metoprolol tartrate  5 mg Intravenous Q6H  . multivitamin with minerals  1 tablet Oral Daily  . pantoprazole  40 mg Oral Q0600   Continuous Infusions: . sodium chloride 100 mL/hr at 09/08/17 2300  . piperacillin-tazobactam (ZOSYN)  IV 3.375 g (09/09/17 2924)  . vancomycin Stopped (09/09/17 0516)     LOS: 2 days    Time spent: [place], MD [facility]   If 7PM-7AM, please contact night-coverage [email or web address] Password [facility] 09/09/2017, 7:36 AM

## 2017-09-09 NOTE — Progress Notes (Signed)
Patient to OR with wife signing consent.

## 2017-09-09 NOTE — Anesthesia Preprocedure Evaluation (Addendum)
Anesthesia Evaluation  Patient identified by MRN, date of birth, ID band Patient awake    Reviewed: Allergy & Precautions, NPO status , Patient's Chart, lab work & pertinent test results  Airway Mallampati: II  TM Distance: >3 FB Neck ROM: Full    Dental  (+) Dental Advisory Given, Upper Dentures   Pulmonary neg pulmonary ROS, sleep apnea and Continuous Positive Airway Pressure Ventilation , COPD, former smoker,    breath sounds clear to auscultation       Cardiovascular hypertension, + CAD, + Past MI and +CHF  negative cardio ROS  + dysrhythmias Ventricular Fibrillation + Cardiac Defibrillator  Rhythm:Regular Rate:Normal     Neuro/Psych Seizures -,  CVA negative neurological ROS     GI/Hepatic negative GI ROS, Neg liver ROS,   Endo/Other  negative endocrine ROS  Renal/GU negative Renal ROS     Musculoskeletal negative musculoskeletal ROS (+) Arthritis ,   Abdominal (+) + obese,   Peds  Hematology negative hematology ROS (+)   Anesthesia Other Findings Day of surgery medications reviewed with the patient.  Reproductive/Obstetrics                            Lab Results  Component Value Date   WBC 18.1 (H) 09/09/2017   HGB 14.6 09/09/2017   HCT 46.4 09/09/2017   MCV 90.1 09/09/2017   PLT PLATELET CLUMPS NOTED ON SMEAR, UNABLE TO ESTIMATE 09/09/2017   Lab Results  Component Value Date   CREATININE 1.02 09/08/2017   BUN 15 09/08/2017   NA 139 09/08/2017   K 4.3 09/08/2017   CL 109 09/08/2017   CO2 21 (L) 09/08/2017   Lab Results  Component Value Date   INR 2.93 09/09/2017   INR 4.63 (HH) 09/08/2017   INR 4.38 (HH) 09/08/2017   EKG: normal sinus rhythm.  Echo: - Left ventricle: The cavity size was normal. Wall thickness was   normal. Systolic function was normal. The estimated ejection   fraction was in the range of 55% to 60%. Wall motion was normal;   there were no  regional wall motion abnormalities. - Left atrium: The atrium was mildly dilated.   Anesthesia Physical Anesthesia Plan  ASA: IV  Anesthesia Plan: General   Post-op Pain Management:    Induction: Intravenous  PONV Risk Score and Plan: 3 and Ondansetron, Dexamethasone, Midazolam and Treatment may vary due to age or medical condition  Airway Management Planned: Oral ETT  Additional Equipment:   Intra-op Plan:   Post-operative Plan: Extubation in OR  Informed Consent: I have reviewed the patients History and Physical, chart, labs and discussed the procedure including the risks, benefits and alternatives for the proposed anesthesia with the patient or authorized representative who has indicated his/her understanding and acceptance.   Dental advisory given  Plan Discussed with: CRNA  Anesthesia Plan Comments: (Will send T&S prior to OR. Risks of bleeding discussed in detail with patient and family. )     Anesthesia Quick Evaluation

## 2017-09-09 NOTE — Progress Notes (Signed)
Call to Dr Doreatha Martin for post orders for IV pain meds and diet orders. IV Morphine 2mg  q 4hrs mod severe pain. Advance diet as tolerated.

## 2017-09-09 NOTE — Interval H&P Note (Signed)
History and Physical Interval Note:  09/09/2017 12:28 PM  Travis Cruz  has presented today for surgery, with the diagnosis of Left hip fracture  The various methods of treatment have been discussed with the patient and family. After consideration of risks, benefits and other options for treatment, the patient has consented to  Procedure(s): REMOVAL OF DISTAL FEMUR PLATE, INTRAMEDULLARY (IM) NAIL FEMORAL (Left) as a surgical intervention .  The patient's history has been reviewed, patient examined, no change in status, stable for surgery.  I have reviewed the patient's chart and labs.  Questions were answered to the patient's satisfaction.     [name], Thomasene Lot

## 2017-09-10 ENCOUNTER — Inpatient Hospital Stay (HOSPITAL_COMMUNITY): Payer: BLUE CROSS/BLUE SHIELD

## 2017-09-10 LAB — CBC
HCT: 43.3 % (ref 39.0–52.0)
Hemoglobin: 13.3 g/dL (ref 13.0–17.0)
MCH: 28.2 pg (ref 26.0–34.0)
MCHC: 30.7 g/dL (ref 30.0–36.0)
MCV: 91.7 fL (ref 78.0–100.0)
Platelets: UNDETERMINED 10*3/uL (ref 150–400)
RBC: 4.72 MIL/uL (ref 4.22–5.81)
RDW: 16.6 % — ABNORMAL HIGH (ref 11.5–15.5)
WBC: 14.2 10*3/uL — ABNORMAL HIGH (ref 4.0–10.5)

## 2017-09-10 LAB — PATHOLOGIST SMEAR REVIEW

## 2017-09-10 LAB — PROTIME-INR
INR: 2.81
Prothrombin Time: 29.4 seconds — ABNORMAL HIGH (ref 11.4–15.2)

## 2017-09-10 MED ORDER — FUROSEMIDE 10 MG/ML IJ SOLN
60.0000 mg | Freq: Two times a day (BID) | INTRAMUSCULAR | Status: DC
  Administered 2017-09-10: 60 mg via INTRAVENOUS
  Filled 2017-09-10: qty 6

## 2017-09-10 MED ORDER — FUROSEMIDE 10 MG/ML IJ SOLN: 60.0000 mg | Freq: Two times a day (BID) | INTRAMUSCULAR | Status: DC

## 2017-09-10 MED ORDER — METOPROLOL TARTRATE 5 MG/5ML IV SOLN
2.5000 mg | Freq: Four times a day (QID) | INTRAVENOUS | Status: DC
  Administered 2017-09-10: 2.5 mg via INTRAVENOUS
  Filled 2017-09-10: qty 5

## 2017-09-10 NOTE — Anesthesia Postprocedure Evaluation (Signed)
Anesthesia Post Note  Patient: Travis Cruz  Procedure(s) Performed: Procedure(s) (LRB): INTRAMEDULLARY (IM) NAIL FEMORAL (Left)     Patient location during evaluation: PACU Anesthesia Type: General Level of consciousness: awake and alert Pain management: pain level controlled Vital Signs Assessment: post-procedure vital signs reviewed and stable Respiratory status: spontaneous breathing, nonlabored ventilation, respiratory function stable and patient connected to nasal cannula oxygen Cardiovascular status: blood pressure returned to baseline and stable Postop Assessment: no apparent nausea or vomiting Anesthetic complications: no    Last Vitals:  Vitals:   09/10/17 1212 09/10/17 1228  BP:  92/61  Pulse:  95  Resp:  19  Temp: 36.6 C   SpO2:  95%    Last Pain:  Vitals:   09/10/17 1212  TempSrc: Oral  PainSc:                  Effie Berkshire

## 2017-09-10 NOTE — Progress Notes (Deleted)
Pt.'s daughter refuses lab draw d/t the orders "being old" and prescribed by "a doctor that no longer sees her". I educated the pt.'s daughter on why the labs were important, however the daughter stated she wanted to speak to the attending that is currently seeing her mom first. She states she wants the orders to be revisited.

## 2017-09-10 NOTE — Progress Notes (Signed)
Orthopaedic Trauma Progress Note  S: Pain better in left hip, x-rays of ankle obtained last night. Some confusion and breathing difficulty overnight. Wife at bedside  O:  Vitals:   09/10/17 0735 09/10/17 0820  BP: 99/69 103/67  Pulse: (!) 102 (!) 101  Resp: 18 16  Temp: 98.6 F (37 C)   SpO2:  93%  Gen: somnolent, arosable LLE: No significant swelling of left leg. Active DF/PF, sensation intact to light touch. No pain with gentle ROM  Imaging: AP pelvis and left femur show well reduced intertroch fracture with stable hardware. Ankle x-rays also reviewed which show no fracture  Labs: Hgb 13.3  A/P: 71 yo male POD1 s/p left intertroch  -WBAT LLE -PT/OT when able -Keep dressing clean, dry and intact -May restart coumadin from orthopaedic standpoint -Will continue to follow  Shona Needles, MD [facility] [phone] (phone)

## 2017-09-10 NOTE — Progress Notes (Signed)
PT placed on a V60 bipap 14/6 50%

## 2017-09-10 NOTE — Care Management Note (Signed)
Case Management Note  Patient Details  Name: Travis Cruz MRN: [id number] Date of Birth: 1946/05/11  Subjective/Objective:   From home with wife presents with Left femur intertrochanteric fracture, POD 1 IM nail, confused overnight with difficulty coming off bipap with sats in 80's.                   Action/Plan: NCM will cont to follow for dc needs.   Expected Discharge Date:  09/10/17               Expected Discharge Plan:     In-House Referral:     Discharge planning Services  CM Consult  Post Acute Care Choice:    Choice offered to:     DME Arranged:    DME Agency:     HH Arranged:    HH Agency:     Status of Service:  In process, will continue to follow  If discussed at Long Length of Stay Meetings, dates discussed:    Additional Comments:  Zenon Mayo, RN 09/10/2017, 4:04 PM

## 2017-09-10 NOTE — Progress Notes (Signed)
OT Cancellation Note  Patient Details Name: Travis Cruz MRN: [id number] DOB: 11-06-1946   Cancelled Treatment:    Reason Eval/Treat Not Completed: Other (comment) (RN performing breathing treatment, requested to return later). OT will continue to follow and will return as schedule allows for evaluation.  [place] 09/10/2017, 9:44 AM  Hulda Humphrey OTR/L [phone]

## 2017-09-10 NOTE — Progress Notes (Signed)
PT Cancellation Note  Patient Details Name: Travis Cruz MRN: [id number] DOB: 21-Oct-1946   Cancelled Treatment:    Reason Eval/Treat Not Completed: Other (comment) Spoke with nurse (Mitzi) who at this time, does not feel comfortable with therapies mobilizing patient.    Duncan Dull 09/10/2017, 9:44 AM Alben Deeds, PT DPT  Board Certified Neurologic Specialist [phone]

## 2017-09-10 NOTE — OR Nursing (Signed)
LATE ENTRY: Entered missing implant [id number].  Verified with RN circulator's signed request to purchase.

## 2017-09-10 NOTE — Progress Notes (Signed)
Message sent to Dr [name] that patient unable to wean from BiPAP and unable to take 10mg  Lopressor.

## 2017-09-10 NOTE — Progress Notes (Signed)
Patient resting much better after placed on BiPap and lasix.

## 2017-09-10 NOTE — Progress Notes (Signed)
PROGRESS NOTE    Travis Cruz  GMW:[id number] DOB: [birth year] DOA: 09/07/2017 PCP: Timmothy Euler, MD   Specialists:   Cardiology orthopedics  Brief Narrative:  64 CAD, prior V. fib arrest 2014-severe single-vessel CAD Ischemic cardiomyopathy with systolic and diastolic heart failure last EF 50-55% 06/2016-prior ICD placement Lung nodule which is malignant and prior pulmonary embolism COPD HTN OSA on CPAP  Admitted on 09/07/17 with complex hip fracture at [facility] clearance by cardiology Found to have mild dehydration, mild rhabdomyolysis with CK of 1265 Mild leukocytosis with x-ray showing cardiomegaly and lung scarring Cardiology consulted, orthopedics consult and recommended trauma surgery evaluation given complexity of the case High risk for surgery  Assessment & Plan:   Principal Problem:   Hip fracture requiring operative repair [facility]) Active Problems:   Deep vein thrombosis (DVT) ([place])   HTN (hypertension)   NSTEMI (non-ST elevated myocardial infarction) ([place])   Ischemic cardiomyopathy   HLD (hyperlipidemia)   CAD (coronary atherosclerotic disease)   Obstructive sleep apnea   COPD (chronic obstructive pulmonary disease) ([place])   History of DVT (deep vein thrombosis)   Hip fracture ([place])   Closed left hip fracture, initial encounter ([place])   Supratherapeutic INR  Acute hypoxic respiratory failure superimposed on chronic COPD/OSA Decompensated acute exacerbation of Chronic congestive heart failure EF this admission as below  Cont SpiriVA 18 MCG DAILY  He is not wheezy and on my exam today 9/13 he thousand he has crackles bilaterally  We will diuresis as he is Lasix nave with Lasix 60 twice a day now, place Foley catheter and discontinue IV fluid  Getting chest x-ray to confirm fluid    complex left hip proximal femur fracture  Status post intramedullary nailing left 9/12  per discretion  Ortho services-appreciate their input into   Anticoagulation post-op--resuming Coumadin , INR 2.8 this morning  Wound care  Pain management  Follow-up services required  History of DVT and PE-also history of PFO 2012 and stroke  INR 2.8  Leukocytosis 14 mm NSCLC stage I status post XRT 02/2017  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 fib arrest status post pacemaker 07/01/2013 CAD with single-vessel disease  Stable at this time-continue metoprolol 10 every 6 IV--? Apparently was not on a beta blocker as an outpatient?  Once able will transition to by mouth metoprolol XL 12.5 but careful with regards to use of the same in the common data to heart failure  2 GI bleeds in 2014 secondary to colonic polyp 06/30/2013-Dr. buccini  Careful with anticoagulation, keep the low range of therapeutic   Prior seizure disorder  Continue keppra 500 2 tabs twice a day  Left ankle pain  Swelling in the left ankle is diminished and x-ray 9/12 was negative for acute fracture   DVT prophylaxis: TX ON COUMADIN Code Status: FULL Family Communication:  D/W WIFE Disposition Plan:  INPATIENT on step down   Consultants:   ORTHO  CARDIO  Procedures:   Hip nialing L 9/12  ECHO 9/11 Study Conclusions - Left ventricle: The cavity size was normal. Wall thickness was   normal. Systolic function was normal. The estimated ejection   fraction was in the range of 55% to 60%. Wall motion was normal;   there were no regional wall motion abnormalities. - Left  atrium: The atrium was mildly dilated. - Normal LV function; mild LAE.  Antimicrobials:   Vanc/Zopsyn--->Zosyn 9/12    Subjective:  Confused overnight with difficulty coming off of the Bipap with sats in 80's at times Alert oriented however Has recieved ~ 3 liters peri-operatively Feels  smothered.  NO n/v No cp , able to communicate despite BiPAP   Objective: Vitals:   09/10/17 0046 09/10/17 0100 09/10/17 0317 09/10/17 0735  BP: (!) 87/60 (!) 87/61 (!) 102/59 99/69  Pulse: (!) 120 (!) 121 (!) 108 (!) 102  Resp: (!) 28 (!) 22 (!) 28 18  Temp: (!) 97.3 F (36.3 C)  99.2 F (37.3 C) 98.6 F (37 C)  TempSrc: Oral  Oral Axillary  SpO2: 96% 97% 95%   Weight:      Height:        Intake/Output Summary (Last 24 hours) at 09/10/17 0815 Last data filed at 09/10/17 0738  Gross per 24 hour  Intake          4896.67 ml  Output             2250 ml  Net          2646.67 ml   Filed Weights   09/07/17 0910 09/09/17 1142  Weight: 98.9 kg (218 lb) 98.9 kg (218 lb)    Examination: EOMI NCAT on Bipap S1-S2 no murmur rub or gallop Chest has bibasilar crackels without Abdomen nontender Left hip has bandage on it Left ankle is swollenAs is left lower extremity-there is fullness around the knee   Data Reviewed: I have personally reviewed following labs and imaging studies  CBC:  Recent Labs Lab 09/07/17 1116 09/08/17 0233 09/08/17 1157 09/09/17 0827 09/10/17 0346  WBC 23.6* 17.8* 16.8* 18.1* 14.2*  NEUTROABS 20.7*  --   --  13.4*  --   HGB 17.8* 15.3 16.4 14.6 13.3  HCT 54.6* 47.3 50.6 46.4 43.3  MCV 89.2 89.6 89.6 90.1 91.7  PLT 53* PLATELET CLUMPS NOTED ON SMEAR, UNABLE TO ESTIMATE PLATELET CLUMPS NOTED ON SMEAR, UNABLE TO ESTIMATE PLATELET CLUMPS NOTED ON SMEAR, UNABLE TO ESTIMATE PLATELET CLUMPS NOTED ON SMEAR, UNABLE TO ESTIMATE   Basic Metabolic Panel:  Recent Labs Lab 09/07/17 0919 09/07/17 1412 09/08/17 0233 09/08/17 0809  NA 138  --  138 139  K 4.3  --  4.7 4.3  CL 101  --  107 109  CO2 23  --  23 21*  GLUCOSE 155*  --  94 89  BUN 21*  --  16 15  CREATININE 1.27*  --  1.17 1.02  CALCIUM 9.5  --  8.4* 8.4*  MG  --  2.0  --   --    GFR: Estimated Creatinine Clearance: 79.3 mL/min (by C-G formula based on SCr of 1.02 mg/dL). Liver Function  Tests:  Recent Labs Lab 09/08/17 0233 09/09/17 0827  AST 85* 50*  ALT 30 26  ALKPHOS 87 85  BILITOT 1.5* 1.3*  PROT 5.9* 6.0*  ALBUMIN 2.8* 2.6*   No results for input(s): LIPASE, AMYLASE in the last 168 hours. No results for input(s): AMMONIA in the last 168 hours. Coagulation Profile:  Recent Labs Lab 09/07/17 1116 09/08/17 0233 09/08/17 0809 09/09/17 0405 09/10/17 0346  INR 5.15* 4.38* 4.63* 2.93 2.81   Cardiac Enzymes:  Recent Labs Lab 09/07/17 0919 09/07/17 1116 09/07/17 1412 09/07/17 2043 09/08/17 0233  CKTOTAL  --  1,265*  --   --   --   TROPONINI 0.05*  --  0.06* 0.07* 0.07*   BNP (last 3 results) No results for input(s): PROBNP in the last 8760 hours. HbA1C: No results for input(s): HGBA1C in the last 72 hours. CBG: No results for input(s): GLUCAP in the last 168 hours. Lipid Profile: No results for input(s): CHOL, HDL, LDLCALC, TRIG, CHOLHDL, LDLDIRECT in the last 72 hours. Thyroid Function Tests:  Recent Labs  09/07/17 1000  TSH 1.069   Anemia Panel: No results for input(s): VITAMINB12, FOLATE, FERRITIN, TIBC, IRON, RETICCTPCT in the last 72 hours. Urine analysis:    Component Value Date/Time   COLORURINE AMBER (A) 09/07/2017 0953   APPEARANCEUR CLOUDY (A) 09/07/2017 0953   APPEARANCEUR Clear 04/24/2016 0811   LABSPEC 1.025 09/07/2017 0953   PHURINE 5.0 09/07/2017 0953   GLUCOSEU NEGATIVE 09/07/2017 0953   HGBUR NEGATIVE 09/07/2017 0953   BILIRUBINUR NEGATIVE 09/07/2017 0953   BILIRUBINUR Negative 04/24/2016 0811   KETONESUR 5 (A) 09/07/2017 0953   PROTEINUR 100 (A) 09/07/2017 0953   UROBILINOGEN 1.0 01/31/2013 1035   NITRITE NEGATIVE 09/07/2017 0953   LEUKOCYTESUR NEGATIVE 09/07/2017 0953   LEUKOCYTESUR Negative 04/24/2016 0811     Radiology Studies: Reviewed images personally in health database    Scheduled Meds: . arformoterol  15 mcg Nebulization BID  . budesonide (PULMICORT) nebulizer solution  0.25 mg Nebulization BID    . ipratropium  0.5 mg Nebulization BID  . levalbuterol  1.25 mg Nebulization BID  . levETIRAcetam  500 mg Oral BID  . loratadine  10 mg Oral Daily  . metoprolol tartrate  10 mg Intravenous Q6H  . multivitamin with minerals  1 tablet Oral Daily  . pantoprazole  40 mg Oral Q0600   Continuous Infusions: . sodium chloride 100 mL/hr at 09/10/17 0045  . lactated ringers 10 mL/hr at 09/09/17 1149  . piperacillin-tazobactam (ZOSYN)  IV 3.375 g (09/10/17 0645)     LOS: 3 days    Time spent: [place], MD [facility]   If 7PM-7AM, please contact night-coverage [email or web address] Password [facility] 09/10/2017, 8:15 AM

## 2017-09-11 ENCOUNTER — Encounter (HOSPITAL_COMMUNITY): Payer: Self-pay | Admitting: Student

## 2017-09-11 ENCOUNTER — Inpatient Hospital Stay (HOSPITAL_COMMUNITY): Payer: BLUE CROSS/BLUE SHIELD

## 2017-09-11 DIAGNOSIS — I5032 Chronic diastolic (congestive) heart failure: Secondary | ICD-10-CM

## 2017-09-11 DIAGNOSIS — I5031 Acute diastolic (congestive) heart failure: Secondary | ICD-10-CM

## 2017-09-11 LAB — CBC WITH DIFFERENTIAL/PLATELET
Basophils Absolute: 0 10*3/uL (ref 0.0–0.1)
Basophils Relative: 0 %
Eosinophils Absolute: 0.9 10*3/uL — ABNORMAL HIGH (ref 0.0–0.7)
Eosinophils Relative: 6 %
HCT: 43.5 % (ref 39.0–52.0)
Hemoglobin: 13.2 g/dL (ref 13.0–17.0)
Lymphocytes Relative: 13 %
Lymphs Abs: 2 10*3/uL (ref 0.7–4.0)
MCH: 28.1 pg (ref 26.0–34.0)
MCHC: 30.3 g/dL (ref 30.0–36.0)
MCV: 92.6 fL (ref 78.0–100.0)
Monocytes Absolute: 2.3 10*3/uL — ABNORMAL HIGH (ref 0.1–1.0)
Monocytes Relative: 15 %
Neutro Abs: 10.2 10*3/uL — ABNORMAL HIGH (ref 1.7–7.7)
Neutrophils Relative %: 66 %
RBC: 4.7 MIL/uL (ref 4.22–5.81)
RDW: 16.5 % — ABNORMAL HIGH (ref 11.5–15.5)
WBC: 15.4 10*3/uL — ABNORMAL HIGH (ref 4.0–10.5)

## 2017-09-11 LAB — COMPREHENSIVE METABOLIC PANEL
ALT: 23 U/L (ref 17–63)
AST: 47 U/L — ABNORMAL HIGH (ref 15–41)
Albumin: 2.5 g/dL — ABNORMAL LOW (ref 3.5–5.0)
Alkaline Phosphatase: 83 U/L (ref 38–126)
Anion gap: 8 (ref 5–15)
BUN: 15 mg/dL (ref 6–20)
CO2: 35 mmol/L — ABNORMAL HIGH (ref 22–32)
Calcium: 8.3 mg/dL — ABNORMAL LOW (ref 8.9–10.3)
Chloride: 96 mmol/L — ABNORMAL LOW (ref 101–111)
Creatinine, Ser: 1.19 mg/dL (ref 0.61–1.24)
GFR calc Af Amer: >60 mL/min (ref >60)
GFR calc non Af Amer: >60 mL/min (ref >60)
Glucose, Bld: 93 mg/dL (ref 65–99)
Potassium: 4 mmol/L (ref 3.5–5.1)
Sodium: 139 mmol/L (ref 135–145)
Total Bilirubin: 1.3 mg/dL — ABNORMAL HIGH (ref 0.3–1.2)
Total Protein: 6.5 g/dL (ref 6.5–8.1)

## 2017-09-11 LAB — PROTIME-INR
INR: 2.63
Prothrombin Time: 27.9 seconds — ABNORMAL HIGH (ref 11.4–15.2)

## 2017-09-11 MED ORDER — WARFARIN SODIUM 3 MG PO TABS
3.0000 mg | ORAL_TABLET | Freq: Once | ORAL | Status: AC
  Administered 2017-09-11: 3 mg via ORAL
  Filled 2017-09-11: qty 1

## 2017-09-11 MED ORDER — FUROSEMIDE 10 MG/ML IJ SOLN: 40.0000 mg | Freq: Two times a day (BID) | INTRAMUSCULAR | Status: DC

## 2017-09-11 MED ORDER — METOPROLOL TARTRATE 5 MG/5ML IV SOLN: 2.5000 mg | Freq: Four times a day (QID) | INTRAVENOUS | Status: DC | PRN

## 2017-09-11 MED ORDER — METOPROLOL SUCCINATE ER 25 MG PO TB24
25.0000 mg | ORAL_TABLET | Freq: Every day | ORAL | Status: DC
  Administered 2017-09-12 – 2017-09-14 (×2): 25 mg via ORAL
  Filled 2017-09-11 (×3): qty 1

## 2017-09-11 MED ORDER — METOPROLOL SUCCINATE ER 25 MG PO TB24
12.5000 mg | ORAL_TABLET | Freq: Every day | ORAL | Status: DC
  Administered 2017-09-11: 12.5 mg via ORAL
  Filled 2017-09-11: qty 1

## 2017-09-11 MED ORDER — WARFARIN - PHARMACIST DOSING INPATIENT
Freq: Every day | Status: DC
  Administered 2017-09-15 – 2017-09-16 (×2): 1
  Administered 2017-09-17: 18:00:00

## 2017-09-11 NOTE — Progress Notes (Signed)
PT was placed on q 10L high flow [place]

## 2017-09-11 NOTE — Progress Notes (Addendum)
Progress Note  Patient Name: Travis Cruz Date of Encounter: 09/11/2017   Primary Cardiologist: Percival Spanish, MD Primary Electrophysiologist:  Caryl Comes, MD  Subjective   The patient is in mild pain sec to surgery.   Inpatient Medications    Scheduled Meds: . arformoterol  15 mcg Nebulization BID  . budesonide (PULMICORT) nebulizer solution  0.25 mg Nebulization BID  . furosemide  40 mg Intravenous Q12H  . ipratropium  0.5 mg Nebulization BID  . levalbuterol  1.25 mg Nebulization BID  . levETIRAcetam  500 mg Oral BID  . loratadine  10 mg Oral Daily  . metoprolol succinate  12.5 mg Oral Daily  . multivitamin with minerals  1 tablet Oral Daily  . pantoprazole  40 mg Oral Q0600  . warfarin  3 mg Oral ONCE-1800  . Warfarin - Pharmacist Dosing Inpatient   Does not apply q1800   Continuous Infusions: . piperacillin-tazobactam (ZOSYN)  IV 3.375 g (09/11/17 1050)   PRN Meds: acetaminophen **OR** acetaminophen, ipratropium, levalbuterol, metoprolol tartrate, morphine injection, ondansetron **OR** ondansetron (ZOFRAN) IV, oxyCODONE   Vital Signs    Vitals:   09/11/17 0600 09/11/17 0700 09/11/17 0848 09/11/17 1055  BP: 104/69 111/69 102/71 113/67  Pulse: (!) 104 (!) 106 (!) 107 (!) 123  Resp: 14 14 19    Temp:  98.8 F (37.1 C)    TempSrc:  Axillary    SpO2: 97% 96% 95%   Weight:      Height:        Intake/Output Summary (Last 24 hours) at 09/11/17 1144 Last data filed at 09/11/17 0944  Gross per 24 hour  Intake              220 ml  Output             4850 ml  Net            -4630 ml   Filed Weights   09/07/17 0910 09/09/17 1142  Weight: 218 lb (98.9 kg) 218 lb (98.9 kg)    Telemetry    ST, PVCs, I nsVT 4 beats - Personally Reviewed   Physical Exam   GEN: No acute distress.   Neck: No JVD Cardiac: RRR, no murmurs, rubs, or gallops.  Respiratory: Clear to auscultation bilaterally. GI: Soft, nontender, non-distended  MS: No edema; LLE in a surgical  wrapping Neuro:  Nonfocal  Psych: Normal affect   Labs    Chemistry Recent Labs Lab 09/08/17 0233 09/08/17 0809 09/09/17 0827 09/11/17 0804  NA 138 139  --  139  K 4.7 4.3  --  4.0  CL 107 109  --  96*  CO2 23 21*  --  35*  GLUCOSE 94 89  --  93  BUN 16 15  --  15  CREATININE 1.17 1.02  --  1.19  CALCIUM 8.4* 8.4*  --  8.3*  PROT 5.9*  --  6.0* 6.5  ALBUMIN 2.8*  --  2.6* 2.5*  AST 85*  --  50* 47*  ALT 30  --  26 23  ALKPHOS 87  --  85 83  BILITOT 1.5*  --  1.3* 1.3*  GFRNONAA >60 >60  --  >60  GFRAA >60 >60  --  >60  ANIONGAP 8 9  --  8     Hematology Recent Labs Lab 09/09/17 0827 09/10/17 0346 09/11/17 0804  WBC 18.1* 14.2* 15.4*  RBC 5.15 4.72 4.70  HGB 14.6 13.3 13.2  HCT 46.4 43.3 43.5  MCV 90.1 91.7 92.6  MCH 28.3 28.2 28.1  MCHC 31.5 30.7 30.3  RDW 16.5* 16.6* 16.5*  PLT PLATELET CLUMPS NOTED ON SMEAR, UNABLE TO ESTIMATE PLATELET CLUMPS NOTED ON SMEAR, UNABLE TO ESTIMATE PLATELET CLUMPING, SUGGEST RECOLLECTION OF SAMPLE IN CITRATE TUBE.    Cardiac Enzymes Recent Labs Lab 09/07/17 0919 09/07/17 1412 09/07/17 2043 09/08/17 0233  TROPONINI 0.05* 0.06* 0.07* 0.07*   No results for input(s): TROPIPOC in the last 168 hours.   BNP Recent Labs Lab 09/07/17 0919  BNP 63.0    DDimer No results for input(s): DDIMER in the last 168 hours.   Radiology    Dg Ankle 2 Views Left  Result Date: 09/09/2017 CLINICAL DATA:  Left leg pain after fall. EXAM: LEFT ANKLE - 2 VIEW COMPARISON:  09/07/2017 FINDINGS: Generalized osteopenia of the left ankle with mild soft tissue edema. Intact ankle mortise and base of fifth metatarsal. Intact orthopedic hardware noted along the distal fibula with lateral plate and screw fixation. No appreciable joint effusion. Calcaneal enthesophytes are present. IMPRESSION: No acute appearing osseous abnormality. Intact orthopedic fixation hardware along the distal fibula. Electronically Signed   By: Ashley Royalty M.D.   On: 09/09/2017  21:04   Dg Pelvis Portable  Result Date: 09/09/2017 CLINICAL DATA:  Post- operative left intramedullary nail fixation of a intertrochanteric fracture of the left hip. EXAM: PORTABLE PELVIS 1-2 VIEWS COMPARISON:  Fluoro spot radiographs of today's date FINDINGS: The bones are subjectively osteopenic. The pelvis exhibits no acute abnormality. The patient has undergone ORIF for an intertrochanteric fracture of the left hip. The visualized portions of the metallic hardware appeared normal. IMPRESSION: Limited views of the left hip. Status post ORIF for an intertrochanteric fracture. Electronically Signed   By: David  Martinique M.D.   On: 09/09/2017 15:59   Dg Chest Port 1 View  Result Date: 09/10/2017 CLINICAL DATA:  Pneumonia.  Left hip replacement 2 days ago EXAM: PORTABLE CHEST 1 VIEW COMPARISON:  Three days ago FINDINGS: Low volume chest with streaky density attributed atelectasis and left perihilar scarring. Patient has history of treated lung cancer. Cardiomegaly with single chamber pacer/ICD -left-sided venotomy and right-sided battery pack. No effusion or pneumothorax is seen. No air bronchogram. Left lateral rib fracture is, also seen on May 2018 chest CT. IMPRESSION: 1. Low volume chest with atelectasis and scarring. 2. Emphysema by recent chest CT. 3. No convincing edema or focal pneumonia. Electronically Signed   By: Monte Fantasia M.D.   On: 09/10/2017 13:46   Dg C-arm 1-60 Min  Result Date: 09/09/2017 CLINICAL DATA:  71 year old male with left intertrochanteric femur fracture undergoing ORIF. EXAM: DG C-ARM 61-120 MIN; LEFT FEMUR 2 VIEWS COMPARISON:  Left femur series 09/08/17 FINDINGS: Seven intraoperative fluoroscopic spot views of the left femur demonstrate placement of a proximal left femoral intramedullary rod with distal interlocking cortical screw and proximal interlocking dynamic hip screw. Fracture alignment appears near anatomic on the later images. Partially visible preexisting distal  left femur plate and screw fixation. FLUOROSCOPY TIME:  1 minutes 51 seconds IMPRESSION: ORIF proximal left femur with no adverse features identified. Electronically Signed   By: Genevie Ann M.D.   On: 09/09/2017 14:26   Dg Femur Min 2 Views Left  Result Date: 09/09/2017 CLINICAL DATA:  71 year old male with left intertrochanteric femur fracture undergoing ORIF. EXAM: DG C-ARM 61-120 MIN; LEFT FEMUR 2 VIEWS COMPARISON:  Left femur series 09/08/17 FINDINGS: Seven intraoperative fluoroscopic spot views of the left femur demonstrate placement of a  proximal left femoral intramedullary rod with distal interlocking cortical screw and proximal interlocking dynamic hip screw. Fracture alignment appears near anatomic on the later images. Partially visible preexisting distal left femur plate and screw fixation. FLUOROSCOPY TIME:  1 minutes 51 seconds IMPRESSION: ORIF proximal left femur with no adverse features identified. Electronically Signed   By: Genevie Ann M.D.   On: 09/09/2017 14:26   Dg Femur Port Min 2 Views Left  Result Date: 09/09/2017 CLINICAL DATA:  Post- operative left intramedullary nail for an intertrochanteric fracture. EXAM: LEFT FEMUR PORTABLE 2 VIEWS COMPARISON:  Fluoro spot views of today's date FINDINGS: The patient has undergone ORIF for an entered trochanteric fracture of the left hip. The into medullary rod and telescoping screw appear appropriately position. The patient is has undergone previous plate and screw fixation of fractures of the distal femur and proximal tibia. IMPRESSION: No immediate postprocedure complication following ORIF for an intertrochanteric fracture of the left hip. Electronically Signed   By: David  Martinique M.D.   On: 09/09/2017 16:48    Cardiac Studies   TTE: 09/09/17 - Left ventricle: The cavity size was normal. Wall thickness was   normal. Systolic function was normal. The estimated ejection   fraction was in the range of 55% to 60%. Wall motion was normal;   there  were no regional wall motion abnormalities. - Left atrium: The atrium was mildly dilated.  Impressions:  - Normal LV function; mild LAE.   Patient Profile     71 y.o. 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   After fall his EKG showed ectopic atrial tachycardia with a rate of 140. WBC 23,000, creatinine 1.27, Trop 0.05-0.07, lactic acid 2.3, UA negative, neg chest xray. Dr. Aline Brochure with Ortho recommended the patient be transferred to [facility] for surgery due to multiple comorbidities, Since arrival to the [facility] has been in the ICU. He has had some difficulty maintaining oxygen saturation is requiring PRN breathing treatments for sats dropping to 88-92%.  Assessment & Plan    Left intertrochanteric femur fracture Hx of PE and CVA: On chronic Warfarin, restart warfarin as soon as acceptable from surgical standpoint CHF: EF 55-60%  With G1DD. Currently appears dry ICD: working appropriately as of 07/22/2017 CAD 2014 s/p Vfib  arrest with CPR severe single vessel CAD with old inferior MI:  COPD:   Plan:  His troponin elevation is minimal with flat trend in a settings of an acute trauma and sepsis. No ischemic workup is necessary, repeat echo shows improved LVEF 55-60%. The patient is on chronic home O2, saturating well, he appears dry, I would hold iv lasix, encourage PO hydration, and increase metoprolol to 25 mg po BID.   For questions or updates, please contact [place] Please consult  [email or web address] for contact info under Cardiology/STEMI.      Signed, Ena Dawley, MD  09/11/2017, 11:44 AM
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
## 2017-09-11 NOTE — Progress Notes (Signed)
Orthopaedic Trauma Progress Note  S: Pain in left foot. PT got him up to side of bed  O:  Vitals:   09/11/17 1951 09/11/17 2109  BP:  111/69  Pulse:  (!) 102  Resp:  (!) 22  Temp:  98.2 F (36.8 C)  SpO2: 95% 96%  Gen: somnolent, arosable LLE: No significant swelling of left leg. Active DF/PF, sensation intact to light touch. No pain with gentle ROM  A/P: 71 yo male POD2 s/p left intertroch  -WBAT LLE -PT/OT when able -Keep dressing clean, dry and intact -May restart coumadin from orthopaedic standpoint -Xrays of left foot  Shona Needles, MD [facility] [phone] (phone)

## 2017-09-12 LAB — CULTURE, BLOOD (ROUTINE X 2)
Culture: NO GROWTH
Culture: NO GROWTH
Special Requests: ADEQUATE
Special Requests: ADEQUATE

## 2017-09-12 LAB — PROTIME-INR
INR: 2
Prothrombin Time: 22.5 seconds — ABNORMAL HIGH (ref 11.4–15.2)

## 2017-09-12 MED ORDER — WARFARIN SODIUM 5 MG PO TABS
5.0000 mg | ORAL_TABLET | Freq: Once | ORAL | Status: AC
  Administered 2017-09-12: 5 mg via ORAL
  Filled 2017-09-12: qty 1

## 2017-09-12 NOTE — Plan of Care (Signed)
Problem: Activity: Goal: Risk for activity intolerance will decrease Outcome: Progressing Discussed with patient and wife plan of care for the evening, pain management, rest and turning to relieve pressure off his hip and buttock with some teach back displayed.
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
## 2017-09-12 NOTE — NC FL2 (Signed)
[place] LEVEL OF CARE SCREENING TOOL     IDENTIFICATION  Patient Name: Travis Cruz Birthdate: [birth year] Sex: male Admission Date (Current Location): 09/07/2017  [facility] and [place] Number:  [employer] and Address:  [facility] [place] [address] [place], [place] [address]      Provider Number: [id number]  Attending Physician Name and Address:  Nita Sells, MD  Relative Name and Phone Number:  [name], spouse, [phone]    Current Level of Care: Hospital Recommended Level of Care: [place] Prior Approval Number:    Date Approved/Denied:   PASRR Number:    Discharge Plan: SNF    Current Diagnoses: Patient Active Problem List   Diagnosis Date Noted  . Acute diastolic CHF (congestive heart failure), NYHA class 3 ([place])   . Closed left hip fracture, initial encounter ([place])   . Supratherapeutic INR   . Hip fracture requiring operative repair ([place]) 09/07/2017  . Hip fracture ([place]) 09/07/2017  . Abrasion, right knee, initial encounter   . Forehead contusion   . Cancer of bronchus of right lower lobe ([place]) 02/25/2017  . History of DVT (deep vein thrombosis) 12/04/2016  . History of pulmonary embolus (PE) 12/04/2016  . History of CVA (cerebrovascular accident) 12/04/2016  . Macular degeneration, dry 12/20/2015  . Cellulitis and abscess 09/27/2015  . COPD (chronic obstructive pulmonary disease) ([place])   . Implantable cardioverter-defibrillator single chamber [employer] [facility] 10/05/2013  . Malignant neoplasm of upper lobe of left lung ([place]) 09/16/2013  . Abnormal chest x-ray 05/13/2013  . HLD (hyperlipidemia) 05/13/2013  . CAD (coronary atherosclerotic disease) 05/13/2013  . Transient ischemic attack (TIA), and cerebral infarction without residual deficits 03/28/2013  . H/O ventricular fibrillation 03/28/2013  . Ischemic cardiomyopathy 03/15/2013  . NSTEMI (non-ST elevated myocardial infarction) ([place])  01/27/2013  . Obstructive sleep apnea 03/10/2012  . Patent arterial duct 03/10/2012  . HTN (hypertension) 03/24/2011  . Deep vein thrombosis (DVT) ([facility]) 09/23/2008    Orientation RESPIRATION BLADDER Height & Weight     Self, Situation, Place  O2 (HF Nasal cannula 5L) Continent, Indwelling catheter Weight: 98.9 kg (218 lb) Height:  5\' 9"  (175.3 cm)  BEHAVIORAL SYMPTOMS/MOOD NEUROLOGICAL BOWEL NUTRITION STATUS      Continent Diet (Please see DC Summary)  AMBULATORY STATUS COMMUNICATION OF NEEDS Skin   Extensive Assist Verbally Surgical wounds (Closed incision on hip)                       Personal Care Assistance Level of Assistance  Bathing, Feeding, Dressing Bathing Assistance: Maximum assistance Feeding assistance: Independent Dressing Assistance: Limited assistance     Functional Limitations Info             SPECIAL CARE FACTORS FREQUENCY  PT (By licensed PT), OT (By licensed OT)     PT Frequency: 5x/week OT Frequency: 3x/week            Contractures      Additional Factors Info  Code Status, Allergies Code Status Info: Full Allergies Info: Ativan Lorazepam           Current Medications (09/12/2017):  This is the current hospital active medication list Current Facility-Administered Medications  Medication Dose Route Frequency Provider Last Rate Last Dose  . acetaminophen (TYLENOL) tablet 650 mg  650 mg Oral Q6H PRN Reyne Dumas, MD   650 mg at 09/07/17 2300   Or  . acetaminophen (TYLENOL) suppository 650 mg  650  mg Rectal Q6H PRN Reyne Dumas, MD      . arformoterol (BROVANA) nebulizer solution 15 mcg  15 mcg Nebulization BID Eugenie Filler, MD   15 mcg at 09/12/17 0901  . budesonide (PULMICORT) nebulizer solution 0.25 mg  0.25 mg Nebulization BID Eugenie Filler, MD   0.25 mg at 09/12/17 0901  . ipratropium (ATROVENT) nebulizer solution 0.5 mg  0.5 mg Nebulization Q2H PRN Eugenie Filler, MD      . ipratropium (ATROVENT) nebulizer  solution 0.5 mg  0.5 mg Nebulization BID Eugenie Filler, MD   0.5 mg at 09/12/17 0901  . levalbuterol (XOPENEX) nebulizer solution 1.25 mg  1.25 mg Nebulization Q2H PRN Eugenie Filler, MD      . levalbuterol Penne Lash) nebulizer solution 1.25 mg  1.25 mg Nebulization BID Eugenie Filler, MD   1.25 mg at 09/12/17 0901  . levETIRAcetam (KEPPRA) tablet 500 mg  500 mg Oral BID Eugenie Filler, MD   500 mg at 09/12/17 1937  . loratadine (CLARITIN) tablet 10 mg  10 mg Oral Daily Eugenie Filler, MD   10 mg at 09/12/17 9024  . metoprolol succinate (TOPROL-XL) 24 hr tablet 25 mg  25 mg Oral Daily Dorothy Spark, MD   25 mg at 09/12/17 0924  . morphine 4 MG/ML injection 2 mg  2 mg Intravenous Q4H PRN Haddix, Thomasene Lot, MD   2 mg at 09/09/17 1750  . multivitamin with minerals tablet 1 tablet  1 tablet Oral Daily Eugenie Filler, MD   1 tablet at 09/12/17 [phone]  . ondansetron (ZOFRAN) tablet 4 mg  4 mg Oral Q6H PRN Reyne Dumas, MD       Or  . ondansetron (ZOFRAN) injection 4 mg  4 mg Intravenous Q6H PRN Reyne Dumas, MD   4 mg at 09/09/17 1747  . oxyCODONE (Oxy IR/ROXICODONE) immediate release tablet 5 mg  5 mg Oral Q4H PRN Reyne Dumas, MD   5 mg at 09/12/17 0933  . pantoprazole (PROTONIX) EC tablet 40 mg  40 mg Oral Q0600 Eugenie Filler, MD   40 mg at 09/12/17 0530  . Warfarin - Pharmacist Dosing Inpatient   Does not apply [id number] Nita Sells, MD         Discharge Medications: Please see discharge summary for a list of discharge medications.  Relevant Imaging Results:  Relevant Lab Results:   Additional Information SSN: [place], [place]

## 2017-09-12 NOTE — Clinical Social Work Note (Signed)
Clinical Social Work Assessment  Patient Details  Name: Travis Cruz MRN: [id number] Date of Birth: 21-Oct-1946  Date of referral:  09/12/17               Reason for consult:  Facility Placement                Permission sought to share information with:  Facility Sport and exercise psychologist, Family Supports Permission granted to share information::  Yes, Verbal Permission Granted  Name::     Travis Cruz::  SNFs  Relationship::  Spouse  Contact Information:  [phone]  Housing/Transportation Living arrangements for the past 2 months:  Single Family Home Source of Information:  Patient, Spouse Patient Interpreter Needed:  None Criminal Activity/Legal Involvement Pertinent to Current Situation/Hospitalization:  No - Comment as needed Significant Relationships:  Adult Children, Spouse Lives with:  Spouse Do you feel safe going back to the place where you live?  No Need for family participation in patient care:  Yes (Comment)  Care giving concerns:  CSW received consult for possible SNF placement at time of discharge. CSW spoke with patient and his spouse at bedside regarding PT recommendation of SNF placement at time of discharge. Patient reported that patient's spouse is currently unable to care for patient at their home given patient's current physical needs and fall risk since she works during the day. Patient expressed understanding of PT recommendation and is agreeable to SNF placement at time of discharge. CSW to continue to follow and assist with discharge planning needs.   Social Worker assessment / plan:  CSW spoke with patient concerning possibility of rehab at [facility] before returning home.  Employment status:  Retired Surveyor, [employer] Care PT Recommendations:  Travis [facility] / Referral to community resources:  [facility]  Patient/Family's Response to care:  Patient recognizes need for rehab before returning home and is  agreeable to a SNF in [place]. Patient reported preference for [facility]. Patient has BCBS through his wife's job. CSW explained that pre-authorization will need to be obtained before he is able to be admitted to SNF. Patient's wife open to other options if [facility] is unable to accept them.   Patient/Family's Understanding of and Emotional Response to Diagnosis, Current Treatment, and Prognosis:  Patient/family is realistic regarding therapy needs and expressed being hopeful for SNF placement. Patient expressed understanding of CSW role and discharge process as well as his medical condition. He is hopeful to wean off so much oxygen. No questions/concerns about plan or treatment.    Emotional Assessment Appearance:  Appears stated age Attitude/Demeanor/Rapport:  Other (Appropriate) Affect (typically observed):  Accepting, Appropriate Orientation:  Oriented to Self, Oriented to Place, Oriented to Situation Alcohol / Substance use:  Not Applicable Psych involvement (Current and /or in the community):  No (Comment)  Discharge Needs  Concerns to be addressed:  Care Coordination Readmission within the last 30 days:  No Current discharge risk:  Dependent with Mobility Barriers to Discharge:  Continued Medical Work up   [employer], Travis Cruz 09/12/2017, 10:26 AM

## 2017-09-12 NOTE — Progress Notes (Signed)
[place] for warfarin Indication: history of DVT/PE  Allergies  Allergen Reactions  . Ativan [Lorazepam] Anxiety and Other (See Comments)    Hyper  [name]    Patient Measurements: Height: 5\' 9"  (175.3 cm) Weight: 218 lb (98.9 kg) IBW/kg (Calculated) : 70.7  Vital Signs: Temp: 98 F (36.7 C) (09/15 0744) Temp Source: Oral (09/15 0744) BP: 121/72 (09/15 1000) Pulse Rate: 99 (09/15 1000)  Labs:  Recent Labs  09/10/17 0346 09/11/17 0405 09/11/17 0804 09/12/17 0427  HGB 13.3  --  13.2  --   HCT 43.3  --  43.5  --   PLT PLATELET CLUMPS NOTED ON SMEAR, UNABLE TO ESTIMATE  --  PLATELET CLUMPING, SUGGEST RECOLLECTION OF SAMPLE IN CITRATE TUBE.  --   LABPROT 29.4* 27.9*  --  22.5*  INR 2.81 2.63  --  2.00  CREATININE  --   --  1.19  --     Estimated Creatinine Clearance: 68 mL/min (by C-G formula based on SCr of 1.19 mg/dL).   Assessment: 71 yo male on warfarin prior to arrival for history of PE and DVT on life long anticoagulation admitted 9/11 with left femur fracture. S/p nailing on 9/12. Warfarin has been held since admission but per MD and ortho notes ok to resume at this point.  INR down to 2 today  Prior to arrival dose: 3 mg daily   Goal of Therapy:  INR 2-3 Monitor platelets by anticoagulation protocol: Yes   Plan:  1. Warfarin 5 mg x 1 this evening  2. Daily INR  Thank you Anette Guarneri, PharmD [phone]  09/12/2017, 10:55 AM

## 2017-09-13 ENCOUNTER — Inpatient Hospital Stay (HOSPITAL_COMMUNITY): Payer: BLUE CROSS/BLUE SHIELD

## 2017-09-13 LAB — CBC
HCT: 39.8 % (ref 39.0–52.0)
Hemoglobin: 12.4 g/dL — ABNORMAL LOW (ref 13.0–17.0)
MCH: 28.4 pg (ref 26.0–34.0)
MCHC: 31.2 g/dL (ref 30.0–36.0)
MCV: 91.3 fL (ref 78.0–100.0)
Platelets: 80 10*3/uL — ABNORMAL LOW (ref 150–400)
RBC: 4.36 MIL/uL (ref 4.22–5.81)
RDW: 16 % — ABNORMAL HIGH (ref 11.5–15.5)
WBC: 13.3 10*3/uL — ABNORMAL HIGH (ref 4.0–10.5)

## 2017-09-13 LAB — RENAL FUNCTION PANEL
Albumin: 2.4 g/dL — ABNORMAL LOW (ref 3.5–5.0)
Anion gap: 7 (ref 5–15)
BUN: 20 mg/dL (ref 6–20)
CO2: 29 mmol/L (ref 22–32)
Calcium: 8.6 mg/dL — ABNORMAL LOW (ref 8.9–10.3)
Chloride: 101 mmol/L (ref 101–111)
Creatinine, Ser: 0.94 mg/dL (ref 0.61–1.24)
GFR calc Af Amer: >60 mL/min (ref >60)
GFR calc non Af Amer: >60 mL/min (ref >60)
Glucose, Bld: 123 mg/dL — ABNORMAL HIGH (ref 65–99)
Phosphorus: 2.5 mg/dL (ref 2.5–4.6)
Potassium: 4 mmol/L (ref 3.5–5.1)
Sodium: 137 mmol/L (ref 135–145)

## 2017-09-13 LAB — PROTIME-INR
INR: 1.89
Prothrombin Time: 21.5 seconds — ABNORMAL HIGH (ref 11.4–15.2)

## 2017-09-13 MED ORDER — POLYETHYLENE GLYCOL 3350 17 G PO PACK
17.0000 g | PACK | Freq: Every day | ORAL | Status: DC
  Administered 2017-09-13 – 2017-09-18 (×5): 17 g via ORAL
  Filled 2017-09-13 (×5): qty 1

## 2017-09-13 MED ORDER — TAMSULOSIN HCL 0.4 MG PO CAPS
0.4000 mg | ORAL_CAPSULE | Freq: Every day | ORAL | Status: DC
  Administered 2017-09-13 – 2017-09-18 (×6): 0.4 mg via ORAL
  Filled 2017-09-13 (×6): qty 1

## 2017-09-13 MED ORDER — WARFARIN SODIUM 5 MG PO TABS
5.0000 mg | ORAL_TABLET | Freq: Once | ORAL | Status: AC
  Administered 2017-09-13: 5 mg via ORAL
  Filled 2017-09-13: qty 1

## 2017-09-13 MED ORDER — FUROSEMIDE 40 MG PO TABS
40.0000 mg | ORAL_TABLET | Freq: Every day | ORAL | Status: DC
  Administered 2017-09-13 – 2017-09-16 (×4): 40 mg via ORAL
  Filled 2017-09-13 (×4): qty 1

## 2017-09-13 MED ORDER — SENNOSIDES-DOCUSATE SODIUM 8.6-50 MG PO TABS
1.0000 | ORAL_TABLET | Freq: Two times a day (BID) | ORAL | Status: DC
  Administered 2017-09-13 – 2017-09-18 (×10): 1 via ORAL
  Filled 2017-09-13 (×10): qty 1
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
## 2017-09-13 NOTE — Plan of Care (Signed)
Problem: Bowel/Gastric: Goal: Will not experience complications related to bowel motility Outcome: Progressing Discussed with patient and wife the importance of a bowel movement now that he is eating with some teach back displayed

## 2017-09-13 NOTE — Plan of Care (Signed)
Problem: Activity: Goal: Risk for activity intolerance will decrease Outcome: Progressing Discussed with patient plan of care for evening, pain management and dangling legs and sitting up in the morning with some teach back displayed.  Patient eager to work with PT to start pivoting to chair.

## 2017-09-13 NOTE — Progress Notes (Signed)
[place] for warfarin Indication: history of DVT/PE  Allergies  Allergen Reactions  . Ativan [Lorazepam] Anxiety and Other (See Comments)    Hyper  [name]    Patient Measurements: Height: 5\' 9"  (175.3 cm) Weight: 218 lb (98.9 kg) IBW/kg (Calculated) : 70.7  Vital Signs: Temp: 98.3 F (36.8 C) (09/16 0751) Temp Source: Oral (09/16 0751) BP: 123/75 (09/16 1000) Pulse Rate: 93 (09/16 1000)  Labs:  Recent Labs  09/11/17 0405 09/11/17 0804 09/12/17 0427 09/13/17 0325 09/13/17 0808  HGB  --  13.2  --   --  12.4*  HCT  --  43.5  --   --  39.8  PLT  --  PLATELET CLUMPING, SUGGEST RECOLLECTION OF SAMPLE IN CITRATE TUBE.  --   --  80*  LABPROT 27.9*  --  22.5* 21.5*  --   INR 2.63  --  2.00 1.89  --   CREATININE  --  1.19  --   --  0.94    Estimated Creatinine Clearance: 86 mL/min (by C-G formula based on SCr of 0.94 mg/dL).   Assessment: 71 yo male on warfarin prior to arrival for history of PE and DVT on life long anticoagulation admitted 9/11 with left femur fracture. S/p nailing on 9/12. Warfarin has been held since admission but per MD and ortho notes ok to resume at this point.  INR down to 1.89 today  Prior to arrival dose: 3 mg daily   Goal of Therapy:  INR 2-3 Monitor platelets by anticoagulation protocol: Yes   Plan:  1. Repeat Warfarin 5 mg x 1 this evening  2. Daily INR  Thank you Anette Guarneri, PharmD [phone]  09/13/2017, 11:15 AM

## 2017-09-14 DIAGNOSIS — Z01810 Encounter for preprocedural cardiovascular examination: Secondary | ICD-10-CM

## 2017-09-14 LAB — CBC WITH DIFFERENTIAL/PLATELET
Basophils Absolute: 0 10*3/uL (ref 0.0–0.1)
Basophils Relative: 0 %
Eosinophils Absolute: 0.1 10*3/uL (ref 0.0–0.7)
Eosinophils Relative: 1 %
HCT: 43.8 % (ref 39.0–52.0)
Hemoglobin: 13.2 g/dL (ref 13.0–17.0)
Lymphocytes Relative: 12 %
Lymphs Abs: 2.1 10*3/uL (ref 0.7–4.0)
MCH: 27 pg (ref 26.0–34.0)
MCHC: 30.1 g/dL (ref 30.0–36.0)
MCV: 89.6 fL (ref 78.0–100.0)
Monocytes Absolute: 2 10*3/uL — ABNORMAL HIGH (ref 0.1–1.0)
Monocytes Relative: 12 %
Neutro Abs: 12.8 10*3/uL — ABNORMAL HIGH (ref 1.7–7.7)
Neutrophils Relative %: 75 %
Platelets: UNDETERMINED 10*3/uL (ref 150–400)
RBC: 4.89 MIL/uL (ref 4.22–5.81)
RDW: 16.3 % — ABNORMAL HIGH (ref 11.5–15.5)
WBC: 17 10*3/uL — ABNORMAL HIGH (ref 4.0–10.5)

## 2017-09-14 LAB — PROTIME-INR
INR: 1.8
Prothrombin Time: 20.7 seconds — ABNORMAL HIGH (ref 11.4–15.2)

## 2017-09-14 LAB — MAGNESIUM: Magnesium: 2 mg/dL (ref 1.7–2.4)

## 2017-09-14 MED ORDER — METOPROLOL TARTRATE 25 MG/10 ML ORAL SUSPENSION
12.5000 mg | Freq: Once | ORAL | Status: AC
  Administered 2017-09-14: 12.5 mg via ORAL
  Filled 2017-09-14: qty 10

## 2017-09-14 MED ORDER — WARFARIN SODIUM 5 MG PO TABS
5.0000 mg | ORAL_TABLET | Freq: Once | ORAL | Status: AC
  Administered 2017-09-14: 5 mg via ORAL
  Filled 2017-09-14: qty 1

## 2017-09-14 MED ORDER — PANTOPRAZOLE SODIUM 40 MG PO TBEC
40.0000 mg | DELAYED_RELEASE_TABLET | Freq: Every day | ORAL | Status: DC
  Administered 2017-09-14 – 2017-09-18 (×5): 40 mg via ORAL
  Filled 2017-09-14 (×5): qty 1

## 2017-09-14 MED ORDER — METOPROLOL TARTRATE 25 MG PO TABS
25.0000 mg | ORAL_TABLET | Freq: Three times a day (TID) | ORAL | Status: DC
  Administered 2017-09-14 – 2017-09-15 (×5): 25 mg via ORAL
  Filled 2017-09-14 (×5): qty 1

## 2017-09-14 MED ORDER — ENOXAPARIN SODIUM 100 MG/ML ~~LOC~~ SOLN
100.0000 mg | Freq: Two times a day (BID) | SUBCUTANEOUS | Status: DC
  Administered 2017-09-14 – 2017-09-15 (×2): 100 mg via SUBCUTANEOUS
  Filled 2017-09-14 (×2): qty 1

## 2017-09-14 NOTE — Progress Notes (Signed)
RT called patient to beside. Patient sat 88% on 6L [place]. RT placed patient on HFNC 10L. Patient sat 92% at this time. RT will continue to monitor as needed.
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
## 2017-09-14 NOTE — Progress Notes (Addendum)
[place] for warfarin and Lovenox  Indication: history of DVT/PE  Allergies  Allergen Reactions  . Ativan [Lorazepam] Anxiety and Other (See Comments)    Hyper  [name]    Patient Measurements: Height: 5\' 9"  (175.3 cm) Weight: 218 lb (98.9 kg) IBW/kg (Calculated) : 70.7  Vital Signs: Temp: 97.8 F (36.6 C) (09/17 0735) Temp Source: Oral (09/17 0735) BP: 123/74 (09/17 0735) Pulse Rate: 104 (09/17 0735)  Labs:  Recent Labs  09/12/17 0427 09/13/17 0325 09/13/17 0808 09/14/17 0429  HGB  --   --  12.4*  --   HCT  --   --  39.8  --   PLT  --   --  80*  --   LABPROT 22.5* 21.5*  --  20.7*  INR 2.00 1.89  --  1.80  CREATININE  --   --  0.94  --     Estimated Creatinine Clearance: 86 mL/min (by C-G formula based on SCr of 0.94 mg/dL).   Assessment: 71 yo male on warfarin prior to arrival for history of PE and DVT on life long anticoagulation admitted 9/11 with left femur fracture. S/p nailing on 9/12. Warfarin resumed 9/14. Per MD to begin Lovenox until INR therapeutic.   INR down to 1.8 Prior to arrival dose: 3 mg daily   Goal of Therapy:  INR 2-3 Monitor platelets by anticoagulation protocol: Yes   Plan:  1. Repeat Warfarin 5 mg x 1 this evening  2. Lovenox 100 mg subcutaneously every 12 hours until INR is therapeutic   3. Daily INR  Vincenza Hews, PharmD, BCPS 09/14/2017, 8:46 AM
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
## 2017-09-14 NOTE — Progress Notes (Signed)
Physical Therapy Treatment Patient Details Name: Travis Cruz MRN: [id number] DOB: 01-Nov-1946 Today's Date: 09/14/2017    History of Present Illness Patient is a 70 year old male with a history of ICD, coronary artery disease, CHF, COPD, prior PE, lung cancer who was admitted 09/07/17 after a fall. Pt is s/p Nailing of Left Intertrochanteric Femur Fracture.    PT Comments    Pt able to transfer bed to recliner this session. HR 136 during mobility, decreasing to 129 in recliner.  Pt with some confusion this AM. Continue with current POC.   Follow Up Recommendations  SNF     Equipment Recommendations  None recommended by PT    Recommendations for Other Services       Precautions / Restrictions Precautions Precautions: Fall;Other (comment) Precaution Comments: watch HR and O2 Restrictions LLE Weight Bearing: Weight bearing as tolerated    Mobility  Bed Mobility         Supine to sit: Min guard;+2 for safety/equipment;HOB elevated     General bed mobility comments: +rail, verbal cues for safety  Transfers Overall transfer level: Needs assistance Equipment used: None Transfers: Sit to/from [facility] Sit to Stand: Mod assist;+2 physical assistance Stand pivot transfers: Mod assist;+2 physical assistance       General transfer comment: increased time, verbal cues for sequencing. Pt unable to attain full upright stance. Pivot transfer performed toward right.  Ambulation/Gait             General Gait Details: unable   Stairs            Wheelchair Mobility    Modified Rankin (Stroke Patients Only)       Balance Overall balance assessment: Needs assistance Sitting-balance support: Feet supported;No upper extremity supported Sitting balance-Leahy Scale: Fair     Standing balance support: Bilateral upper extremity supported;During functional activity Standing balance-Leahy Scale: Zero                               Cognition Arousal/Alertness: Awake/alert Behavior During Therapy: WFL for tasks assessed/performed Overall Cognitive Status: Impaired/Different from baseline Area of Impairment: Safety/judgement;Problem solving                 Orientation Level: Disoriented to;Time   Memory: Decreased short-term memory   Safety/Judgement: Decreased awareness of safety   Problem Solving: Slow processing;Difficulty sequencing        Exercises Total Joint Exercises Ankle Circles/Pumps: AROM;Both;20 reps    General Comments        Pertinent Vitals/Pain Pain Assessment: 0-10 Pain Score: 6  Pain Location: LLE Pain Descriptors / Indicators: Sore Pain Intervention(s): Limited activity within patient's tolerance;Repositioned;Patient requesting pain meds-RN notified    Home Living                      Prior Function            PT Goals (current goals can now be found in the care plan section) Acute Rehab PT Goals Patient Stated Goal: None stated PT Goal Formulation: With patient Time For Goal Achievement: 09/18/17 Potential to Achieve Goals: Good Progress towards PT goals: Progressing toward goals    Frequency    Min 5X/week      PT Plan Current plan remains appropriate    Co-evaluation              AM-PAC PT "6 Clicks" Daily Activity  Outcome Measure  Difficulty turning  over in bed (including adjusting bedclothes, sheets and blankets)?: A Lot Difficulty moving from lying on back to sitting on the side of the bed? : A Lot Difficulty sitting down on and standing up from a chair with arms (e.g., wheelchair, bedside commode, etc,.)?: Unable Help needed moving to and from a bed to chair (including a wheelchair)?: A Lot Help needed walking in hospital room?: A Lot Help needed climbing 3-5 steps with a railing? : Total 6 Click Score: 10    End of Session Equipment Utilized During Treatment: Gait belt;Oxygen Activity Tolerance: Patient tolerated  treatment well Patient left: in chair;with chair alarm set;with call bell/phone within reach;with nursing/sitter in room Nurse Communication: Mobility status PT Visit Diagnosis: Muscle weakness (generalized) (M62.81);History of falling (Z91.81);Difficulty in walking, not elsewhere classified (R26.2);Pain Pain - Right/Left: Left Pain - part of body: Leg     Time: 2595-6387 PT Time Calculation (min) (ACUTE ONLY): 17 min  Charges:  $Therapeutic Activity: 8-22 mins                    G Codes:       Lorrin Goodell, PT  Office # [phone] Pager [phone]    Lorriane Shire 09/14/2017, 12:14 PM

## 2017-09-14 NOTE — Progress Notes (Signed)
Patient refused to wear bipap HS. Patient sat 87& on 6L [place]. Venti and non rebreather offered as well. Education provided on the importance of wearing bipap at night. RT called to place HFNC. Patient sat 95% at this time. RN will continue to monitor.

## 2017-09-15 LAB — PROTIME-INR
INR: 2.35
Prothrombin Time: 25.5 seconds — ABNORMAL HIGH (ref 11.4–15.2)

## 2017-09-15 MED ORDER — FLEET ENEMA 7-19 GM/118ML RE ENEM
1.0000 | ENEMA | Freq: Once | RECTAL | Status: AC
  Administered 2017-09-15: 1 via RECTAL

## 2017-09-15 MED ORDER — WARFARIN SODIUM 3 MG PO TABS
3.0000 mg | ORAL_TABLET | Freq: Once | ORAL | Status: AC
  Administered 2017-09-15: 3 mg via ORAL
  Filled 2017-09-15: qty 1

## 2017-09-15 MED ORDER — SORBITOL 70 % SOLN
20.0000 mL | Freq: Every day | Status: DC
  Administered 2017-09-15 – 2017-09-18 (×3): 20 mL via ORAL
  Filled 2017-09-15 (×3): qty 30

## 2017-09-15 MED ORDER — FLEET ENEMA 7-19 GM/118ML RE ENEM
1.0000 | ENEMA | Freq: Once | RECTAL | Status: DC
  Filled 2017-09-15: qty 1
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
## 2017-09-15 NOTE — Progress Notes (Signed)
[facility] for warfarin and Lovenox  Indication: history of DVT/PE  Allergies  Allergen Reactions  . Ativan [Lorazepam] Anxiety and Other (See Comments)    Hyper  [name]    Patient Measurements: Height: 5\' 9"  (175.3 cm) Weight: 218 lb (98.9 kg) IBW/kg (Calculated) : 70.7  Vital Signs: Temp: 97.7 F (36.5 C) (09/18 0752) Temp Source: Oral (09/18 0752) BP: 125/77 (09/18 0752) Pulse Rate: 100 (09/18 0752)  Labs:  Recent Labs  09/13/17 0325 09/13/17 0808 09/14/17 0429 09/14/17 1613 09/15/17 0352  HGB  --  12.4*  --  13.2  --   HCT  --  39.8  --  43.8  --   PLT  --  80*  --  PLATELET CLUMPS NOTED ON SMEAR, UNABLE TO ESTIMATE  --   LABPROT 21.5*  --  20.7*  --  25.5*  INR 1.89  --  1.80  --  2.35  CREATININE  --  0.94  --   --   --     Estimated Creatinine Clearance: 86 mL/min (by C-G formula based on SCr of 0.94 mg/dL).   Assessment: 71 yo male on warfarin prior to arrival for history of PE and DVT on life long anticoagulation admitted 9/11 with left femur fracture. S/p nailing on 9/12. Warfarin resumed 9/14.   INR therapeutic at 2.35.  Prior to arrival dose: 3 mg daily   Goal of Therapy:  INR 2-3 Monitor platelets by anticoagulation protocol: Yes   Plan:  1. Warfarin 3 mg x 1 this evening  2. INR therapeutic so will stop Lovenox   3. Daily INR   Vincenza Hews, PharmD, BCPS 09/15/2017, 7:58 AM

## 2017-09-15 NOTE — Progress Notes (Signed)
Pt first choice [facility] now has beds available and will start auth this morning for possible admission tomorrow  Jorge Ny, [place] Social Worker [phone]

## 2017-09-15 NOTE — Progress Notes (Signed)
Orthopaedic Trauma Progress Note  S: Worked with PT he said it went well  O:  Vitals:   09/15/17 1150 09/15/17 1522  BP: 132/82 126/86  Pulse: (!) 114 (!) 107  Resp: (!) 22 (!) 21  Temp: 97.7 F (36.5 C) 97.7 F (36.5 C)  SpO2: (!) 87% 94%  Gen: much more awake this PM than when ive seen him previously LLE: Swelling improved. Active DF/PF, sensation intact to light touch. No pain with gentle ROM  A/P: 71 yo male POD6 s/p left intertroch  -WBAT LLE -PT/OT -Plan for x-rays in hospital or in office at 2 weeks postop  Shona Needles, MD [facility] [phone] (phone)

## 2017-09-15 NOTE — Progress Notes (Signed)
Physical Therapy Treatment Patient Details Name: [facility] Cruz MRN: [id number] DOB: [birth year] Today's Date: 09/15/2017    History of Present Illness Patient is a 70 year old male with a history of ICD, coronary artery disease, CHF, COPD, prior PE, lung cancer who was admitted 09/07/17 after a fall. Pt is s/p Nailing of Left Intertrochanteric Femur Fracture.    PT Comments    Pt making good progress. Continue to recommend continued rehab at [facility].   Follow Up Recommendations  SNF     Equipment Recommendations  Other (comment) (To be assessed at next venue)    Recommendations for Other Services       Precautions / Restrictions Precautions Precautions: Fall;Other (comment) Precaution Comments: watch HR and O2 Restrictions Weight Bearing Restrictions: Yes LLE Weight Bearing: Weight bearing as tolerated    Mobility  Bed Mobility Overal bed mobility: Needs Assistance Bed Mobility: Supine to Sit     Supine to sit: +2 for safety/equipment;HOB elevated;Min assist     General bed mobility comments: Assist to bring LLE off of bed and to elevate trunk into sitting  Transfers Overall transfer level: Needs assistance Equipment used: Rolling walker (2 wheeled);Ambulation equipment used Transfers: Sit to/from [facility] Sit to Stand: Mod assist;+2 physical assistance Stand pivot transfers:  (Used Stedy)       General transfer comment: Assist to bring hips and trunk up. Used Stedy to stand from bed and pivot to recliner. Used rolling walker to stand from chair for amb  Ambulation/Gait Ambulation/Gait assistance: Mod assist;+2 safety/equipment (chair follow) Ambulation Distance (Feet): 8 Feet Assistive device: Rolling walker (2 wheeled) Gait Pattern/deviations: Step-to pattern;Decreased step length - right;Decreased step length - left;Decreased stance time - left;Decreased weight shift to left;Narrow base of support;Antalgic Gait velocity: decr Gait  velocity interpretation: Below normal speed for age/gender General Gait Details: Assist for support and to assist advancing LLE and in keeping LLE from crossing midline with foot placement.   Stairs            Wheelchair Mobility    Modified Rankin (Stroke Patients Only)       Balance Overall balance assessment: Needs assistance Sitting-balance support: Feet supported;No upper extremity supported Sitting balance-Leahy Scale: Fair     Standing balance support: Bilateral upper extremity supported;During functional activity Standing balance-Leahy Scale: Poor Standing balance comment: walker and min assist to maintain static standing.                            Cognition Arousal/Alertness: Awake/alert Behavior During Therapy: WFL for tasks assessed/performed Overall Cognitive Status: Impaired/Different from baseline Area of Impairment: Safety/judgement;Problem solving;Memory                     Memory: Decreased short-term memory   Safety/Judgement: Decreased awareness of safety   Problem Solving: Slow processing;Difficulty sequencing;Requires verbal cues        Exercises      General Comments        Pertinent Vitals/Pain Pain Assessment: 0-10 Pain Score: 10-Worst pain ever Pain Location: LLE Pain Descriptors / Indicators: Sore Pain Intervention(s): Limited activity within patient's tolerance;Repositioned    Home Living                      Prior Function            PT Goals (current goals can now be found in the care plan section) Progress towards PT goals: Progressing toward  goals    Frequency    Min 3X/week      PT Plan Current plan remains appropriate;Frequency needs to be updated    Co-evaluation              AM-PAC PT "6 Clicks" Daily Activity  Outcome Measure  Difficulty turning over in bed (including adjusting bedclothes, sheets and blankets)?: A Lot Difficulty moving from lying on back to sitting  on the side of the bed? : Unable Difficulty sitting down on and standing up from a chair with arms (e.g., wheelchair, bedside commode, etc,.)?: Unable Help needed moving to and from a bed to chair (including a wheelchair)?: A Lot Help needed walking in hospital room?: A Lot Help needed climbing 3-5 steps with a railing? : Total 6 Click Score: 9    End of Session Equipment Utilized During Treatment: Gait belt;Oxygen Activity Tolerance: Patient tolerated treatment well Patient left: in chair;with chair alarm set;with call bell/phone within reach Nurse Communication: Mobility status;Need for lift equipment Charlaine Dalton for easier pivotal transfer) PT Visit Diagnosis: Muscle weakness (generalized) (M62.81);History of falling (Z91.81);Difficulty in walking, not elsewhere classified (R26.2);Pain Pain - Right/Left: Left Pain - part of body: Leg     Time: 4034-7425 PT Time Calculation (min) (ACUTE ONLY): 19 min  Charges:  $Therapeutic Activity: 8-22 mins                    G Codes:       [facility] PT [place] 09/15/2017, 12:49 PM

## 2017-09-16 ENCOUNTER — Inpatient Hospital Stay (HOSPITAL_COMMUNITY): Payer: BLUE CROSS/BLUE SHIELD

## 2017-09-16 DIAGNOSIS — J449 Chronic obstructive pulmonary disease, unspecified: Secondary | ICD-10-CM

## 2017-09-16 DIAGNOSIS — I214 Non-ST elevation (NSTEMI) myocardial infarction: Secondary | ICD-10-CM

## 2017-09-16 DIAGNOSIS — G4733 Obstructive sleep apnea (adult) (pediatric): Secondary | ICD-10-CM

## 2017-09-16 LAB — COMPREHENSIVE METABOLIC PANEL
ALT: 74 U/L — ABNORMAL HIGH (ref 17–63)
AST: 55 U/L — ABNORMAL HIGH (ref 15–41)
Albumin: 2.6 g/dL — ABNORMAL LOW (ref 3.5–5.0)
Alkaline Phosphatase: 125 U/L (ref 38–126)
Anion gap: 6 (ref 5–15)
BUN: 25 mg/dL — ABNORMAL HIGH (ref 6–20)
CO2: 33 mmol/L — ABNORMAL HIGH (ref 22–32)
Calcium: 9.4 mg/dL (ref 8.9–10.3)
Chloride: 100 mmol/L — ABNORMAL LOW (ref 101–111)
Creatinine, Ser: 1.04 mg/dL (ref 0.61–1.24)
GFR calc Af Amer: >60 mL/min (ref >60)
GFR calc non Af Amer: >60 mL/min (ref >60)
Glucose, Bld: 120 mg/dL — ABNORMAL HIGH (ref 65–99)
Potassium: 3.8 mmol/L (ref 3.5–5.1)
Sodium: 139 mmol/L (ref 135–145)
Total Bilirubin: 1.2 mg/dL (ref 0.3–1.2)
Total Protein: 6.8 g/dL (ref 6.5–8.1)

## 2017-09-16 LAB — PROTIME-INR
INR: 2.89
Prothrombin Time: 30 seconds — ABNORMAL HIGH (ref 11.4–15.2)

## 2017-09-16 LAB — CBC
HCT: 43.8 % (ref 39.0–52.0)
Hemoglobin: 13.7 g/dL (ref 13.0–17.0)
MCH: 27.8 pg (ref 26.0–34.0)
MCHC: 31.3 g/dL (ref 30.0–36.0)
MCV: 89 fL (ref 78.0–100.0)
Platelets: 242 10*3/uL (ref 150–400)
RBC: 4.92 MIL/uL (ref 4.22–5.81)
RDW: 15.8 % — ABNORMAL HIGH (ref 11.5–15.5)
WBC: 15.5 10*3/uL — ABNORMAL HIGH (ref 4.0–10.5)

## 2017-09-16 LAB — LACTATE DEHYDROGENASE: LDH: 215 U/L — ABNORMAL HIGH (ref 98–192)

## 2017-09-16 MED ORDER — METOPROLOL TARTRATE 25 MG PO TABS
37.5000 mg | ORAL_TABLET | Freq: Two times a day (BID) | ORAL | Status: DC
  Administered 2017-09-16 – 2017-09-18 (×5): 37.5 mg via ORAL
  Filled 2017-09-16 (×6): qty 1

## 2017-09-16 MED ORDER — WARFARIN SODIUM 3 MG PO TABS
3.0000 mg | ORAL_TABLET | Freq: Once | ORAL | Status: AC
  Administered 2017-09-16: 3 mg via ORAL
  Filled 2017-09-16: qty 1

## 2017-09-16 NOTE — Progress Notes (Signed)
Patients wife brought in patients home BIPAP unit. RT filled water chamber with sterile water and mask placed on patient with 5L O2 bleed in. Patient tolerating well at this time.

## 2017-09-16 NOTE — Progress Notes (Signed)
[place] for warfarin and Lovenox  Indication: history of DVT/PE  Allergies  Allergen Reactions  . Ativan [Lorazepam] Anxiety and Other (See Comments)    Hyper  [name]    Patient Measurements: Height: 5\' 9"  (175.3 cm) Weight: 218 lb (98.9 kg) IBW/kg (Calculated) : 70.7  Vital Signs: Temp: 98.4 F (36.9 C) (09/19 0331) Temp Source: Oral (09/19 0331) BP: 98/70 (09/19 0500) Pulse Rate: 81 (09/19 0500)  Labs:  Recent Labs  09/14/17 0429 09/14/17 1613 09/15/17 0352 09/16/17 0342  HGB  --  13.2  --  13.7  HCT  --  43.8  --  43.8  PLT  --  PLATELET CLUMPS NOTED ON SMEAR, UNABLE TO ESTIMATE  --  242  LABPROT 20.7*  --  25.5* 30.0*  INR 1.80  --  2.35 2.89  CREATININE  --   --   --  1.04    Estimated Creatinine Clearance: 77.8 mL/min (by C-G formula based on SCr of 1.04 mg/dL).   Assessment: 71 yo male on warfarin prior to arrival for history of PE and DVT on life long anticoagulation admitted 9/11 with left femur fracture. S/p nailing on 9/12. Warfarin resumed 9/14.   INR therapeutic at 2.89 Prior to arrival dose: 3 mg daily   Goal of Therapy:  INR 2-3 Monitor platelets by anticoagulation protocol: Yes   Plan:  1. Warfarin 3 mg x 1 this evening  2. Daily INR   Levester Fresh, PharmD, BCPS, BCCCP Clinical Pharmacist Clinical phone for 09/16/2017 from 7a-3:30p: [phone] If after 3:30p, please call main pharmacy at: [phone] 09/16/2017 8:20 AM

## 2017-09-16 NOTE — Progress Notes (Signed)
Patient has approval from [facility] to go to [facility]- per MD hopeful for DC tomorrow  CSW will continue to follow  Jorge Ny, [place] Social Worker [phone]

## 2017-09-16 NOTE — Progress Notes (Signed)
PROGRESS NOTE  Travis Cruz  YBO:[id number] DOB: 01-Jul-1946 DOA: 09/07/2017 PCP: Timmothy Euler, MD  Brief Narrative:   49 CAD, prior V. fib arrest 2014-severe single-vessel CAD Ischemic cardiomyopathy with systolic and diastolic heart failure last EF 50-55% 06/2016-prior ICD placement Lung nodule which is malignant and prior pulmonary embolism COPD HTN OSA on CPAP Chronic respiratory failure on home oxygen  Admitted on 09/07/17 with complex hip [facility] at [facility] by cardiology  mild dehydration, mild rhabdomyolysis with CK of 1265 Mild leukocytosis with x-ray showing cardiomegaly and lung scarring Cardiology consulted, orthopedics consulted  High risk for surgery  Underwent surgery but postoperative course was complicated by volume overload, acute hypoxic respiratory failure initially necessitating BiPAP  Assessment & Plan:   Principal Problem:   Hip fracture requiring operative repair [facility]) Active Problems:   Deep vein thrombosis (DVT) ([place])   HTN (hypertension)   NSTEMI (non-ST elevated myocardial infarction) ([place])   Ischemic cardiomyopathy   HLD (hyperlipidemia)   CAD (coronary atherosclerotic disease)   Obstructive sleep apnea   COPD (chronic obstructive pulmonary disease) ([place])   History of DVT (deep vein thrombosis)   Hip fracture ([place])   Closed left hip fracture, initial encounter ([place])   Supratherapeutic INR   Acute diastolic CHF (congestive heart failure), NYHA class 3 ([place])   Preoperative cardiovascular examination  Acute on chronic hypoxic respiratory failure (uses home oxygen 3 L bled into bipap at night, but no oxygen during day according to wife), likely due to to acute on chronic diastolic heart failure in the setting of known COPD/OSA -  Diuresed with IV lasix, but now transitioned to oral  -  CXR:  Atelectasis -  Encouraged OOB to chair and incentive spirometry -   Needs bipap at night  -  Attempting to transition to 2-3L [place]  Complex left hip proximal femur fracture status post intramedullary nailing left 9/12 - Coumadin lifelong  - Appreciate wound care assistance - Complains of pain limiting his mobility, but using very little pain medication - Appreciate orthopedics assistance  History of DVT and PE-also history of PFO 2012 and stroke - unlikely to have DVT in the setting of chronic anticoagulation with therapeutic INR  Leukocytosis + thrombocytopenia, 14 mm NSCLC stage I status post XRT 02/2017 -  Thrombocytopenia resolved  Prior history V. fib arrest status post pacemaker 07/01/2013  CAD with single-vessel disease with tachycardia and NSVT 3-4 beats -  Change to metoprolol 37.5mg  BID to consolidate dosing -  Appreciate cardiology input -  Awaiting ECG and PPM interrogation -  Potassium 3.8  -  Holding statin due to mild transaminitis              2 GI bleeds in 2014 secondary to colonic polyp 06/30/2013-Dr. buccini -  Careful with anticoagulation, keep the low range of therapeutic              Seizure disorder, stable, continue keppra 500 2 tabs twice a day.  Left ankle pain             Swelling in the left ankle is diminished and x-ray 9/12 was negative for acute fracture  Urinary retention, acute -  Replace foley if unable to void today -  Continue flomax started during this admission -  F/u with Urology as outpatient   DVT prophylaxis:  Warfarin  Code Status:  Full code Family Communication:  Patient  Disposition Plan:  Transitioning off  HFNC  Consultants:   Cardiology\  Orthopedic surgery, Dr. Doreatha Martin   Procedures:   Hip nailing L 9/12  ECHO 9/11 Study Conclusions - Left ventricle: The cavity size was normal. Wall thickness was normal. Systolic function was normal. The estimated ejection fraction was in the range of 55% to 60%. Wall motion was normal; there were no regional wall motion abnormalities. -  Left atrium: The atrium was mildly dilated. - Normal LV function; mild LAE.  Antimicrobials:  Anti-infectives    Start     Dose/Rate Route Frequency Ordered Stop   09/09/17 2100  ceFAZolin (ANCEF) IVPB 2g/100 mL premix  Status:  Discontinued     2 g 200 mL/hr over 30 Minutes Intravenous Every 8 hours 09/09/17 1609 09/09/17 1755   09/09/17 1145  ceFAZolin (ANCEF) IVPB 2g/100 mL premix     2 g 200 mL/hr over 30 Minutes Intravenous On call to O.R. 09/09/17 1143 09/09/17 1312   09/08/17 0300  vancomycin (VANCOCIN) 1,250 mg in sodium chloride 0.9 % 250 mL IVPB  Status:  Discontinued     1,250 mg 166.7 mL/hr over 90 Minutes Intravenous Every 12 hours 09/07/17 2248 09/09/17 1755   09/07/17 2200  piperacillin-tazobactam (ZOSYN) IVPB 3.375 g  Status:  Discontinued     3.375 g 12.5 mL/hr over 240 Minutes Intravenous Every 8 hours 09/07/17 1654 09/12/17 0957   09/07/17 1500  vancomycin (VANCOCIN) 1,500 mg in sodium chloride 0.9 % 500 mL IVPB     1,500 mg 250 mL/hr over 120 Minutes Intravenous  Once 09/07/17 1411 09/07/17 1714   09/07/17 1415  piperacillin-tazobactam (ZOSYN) IVPB 3.375 g     3.375 g 100 mL/hr over 30 Minutes Intravenous STAT 09/07/17 1411 09/07/17 1458       Subjective:  Denies SOB but appears SOB.  Wife agrees that he looks more SOB than usual.  Still having significant pain in the left hip, but the ankle is feeling better.  Has been I/O cathed 3 times in the last 24 hours.  Only one dose of narcotic pain medication since day prior.  Told me he hasn't pooped in two days, but he had 6 stools recorded after enema yesterday  Objective: Vitals:   09/16/17 0800 09/16/17 0901 09/16/17 1441 09/16/17 1500  BP: 100/71 119/74  105/74  Pulse:  96 90 95  Resp:  13 14 16   Temp: (!) 97.5 F (36.4 C)   (!) 97.5 F (36.4 C)  TempSrc: Axillary   Oral  SpO2:  93% 96% 95%  Weight:      Height:        Intake/Output Summary (Last 24 hours) at 09/16/17 1655 Last data filed at 09/16/17  0511  Gross per 24 hour  Intake                0 ml  Output             1225 ml  Net            -1225 ml   Filed Weights   09/07/17 0910 09/09/17 1142  Weight: 98.9 kg (218 lb) 98.9 kg (218 lb)    Examination:  General exam:  Adult male, sitting in chair, mildly tachypneic on 6L [place] with intermittent SCM retractions, mild respiratory distress  HEENT:  NCAT, MMM Respiratory system: Rales particularly at the left base, no wheezes or rhonchi Cardiovascular system: Regular rate and rhythm, normal S1/S2. No murmurs, rubs, gallops or clicks.  Warm extremities Gastrointestinal system: Hyperactive bowel  sounds, soft, nondistended, nontender. MSK:  Normal tone and bulk, Scarring of the left lower extremity.  Left hip bandage is clean, dry, intact.  No palpable hematoma. There is mild induration there. The left ankle is very ecchymotic and minimally swollen.   Neuro:  Grossly intact    Data Reviewed: I have personally reviewed following labs and imaging studies  CBC:  Recent Labs Lab 09/10/17 0346 09/11/17 0804 09/13/17 0808 09/14/17 1613 09/16/17 0342  WBC 14.2* 15.4* 13.3* 17.0* 15.5*  NEUTROABS  --  10.2*  --  12.8*  --   HGB 13.3 13.2 12.4* 13.2 13.7  HCT 43.3 43.5 39.8 43.8 43.8  MCV 91.7 92.6 91.3 89.6 89.0  PLT PLATELET CLUMPS NOTED ON SMEAR, UNABLE TO ESTIMATE PLATELET CLUMPING, SUGGEST RECOLLECTION OF SAMPLE IN CITRATE TUBE. 80* PLATELET CLUMPS NOTED ON SMEAR, UNABLE TO ESTIMATE 254   Basic Metabolic Panel:  Recent Labs Lab 09/11/17 0804 09/13/17 0808 09/14/17 2315 09/16/17 0342  NA 139 137  --  139  K 4.0 4.0  --  3.8  CL 96* 101  --  100*  CO2 35* 29  --  33*  GLUCOSE 93 123*  --  120*  BUN 15 20  --  25*  CREATININE 1.19 0.94  --  1.04  CALCIUM 8.3* 8.6*  --  9.4  MG  --   --  2.0  --   PHOS  --  2.5  --   --    GFR: Estimated Creatinine Clearance: 77.8 mL/min (by C-G formula based on SCr of 1.04 mg/dL). Liver Function Tests:  Recent Labs Lab  09/11/17 0804 09/13/17 0808 09/16/17 0342  AST 47*  --  55*  ALT 23  --  74*  ALKPHOS 83  --  125  BILITOT 1.3*  --  1.2  PROT 6.5  --  6.8  ALBUMIN 2.5* 2.4* 2.6*   No results for input(s): LIPASE, AMYLASE in the last 168 hours. No results for input(s): AMMONIA in the last 168 hours. Coagulation Profile:  Recent Labs Lab 09/12/17 0427 09/13/17 0325 09/14/17 0429 09/15/17 0352 09/16/17 0342  INR 2.00 1.89 1.80 2.35 2.89   Cardiac Enzymes: No results for input(s): CKTOTAL, CKMB, CKMBINDEX, TROPONINI in the last 168 hours. BNP (last 3 results) No results for input(s): PROBNP in the last 8760 hours. HbA1C: No results for input(s): HGBA1C in the last 72 hours. CBG: No results for input(s): GLUCAP in the last 168 hours. Lipid Profile: No results for input(s): CHOL, HDL, LDLCALC, TRIG, CHOLHDL, LDLDIRECT in the last 72 hours. Thyroid Function Tests: No results for input(s): TSH, T4TOTAL, FREET4, T3FREE, THYROIDAB in the last 72 hours. Anemia Panel: No results for input(s): VITAMINB12, FOLATE, FERRITIN, TIBC, IRON, RETICCTPCT in the last 72 hours. Urine analysis:    Component Value Date/Time   COLORURINE AMBER (A) 09/07/2017 0953   APPEARANCEUR CLOUDY (A) 09/07/2017 0953   APPEARANCEUR Clear 04/24/2016 0811   LABSPEC 1.025 09/07/2017 0953   PHURINE 5.0 09/07/2017 0953   GLUCOSEU NEGATIVE 09/07/2017 0953   HGBUR NEGATIVE 09/07/2017 0953   BILIRUBINUR NEGATIVE 09/07/2017 0953   BILIRUBINUR Negative 04/24/2016 0811   KETONESUR 5 (A) 09/07/2017 0953   PROTEINUR 100 (A) 09/07/2017 0953   UROBILINOGEN 1.0 01/31/2013 1035   NITRITE NEGATIVE 09/07/2017 0953   LEUKOCYTESUR NEGATIVE 09/07/2017 0953   LEUKOCYTESUR Negative 04/24/2016 0811   Sepsis Labs: @LABRCNTIP (procalcitonin:4,lacticidven:4)  ) Recent Results (from the past 240 hour(s))  Urine culture     Status: None  Collection Time: 09/07/17  9:53 AM  Result Value Ref Range Status   Specimen Description URINE,  CLEAN CATCH  Final   Special Requests NONE  Final   Culture   Final    NO GROWTH Performed at [facility], [address] [place], [place] [address]    Report Status 09/08/2017 FINAL  Final  Culture, blood (routine x 2)     Status: None   Collection Time: 09/07/17 11:17 AM  Result Value Ref Range Status   Specimen Description BLOOD RIGHT WRIST  Final   Special Requests   Final    BOTTLES DRAWN AEROBIC AND ANAEROBIC Blood Culture adequate volume   Culture NO GROWTH 5 DAYS  Final   Report Status 09/12/2017 FINAL  Final  Culture, blood (routine x 2)     Status: None   Collection Time: 09/07/17 12:33 PM  Result Value Ref Range Status   Specimen Description BLOOD RIGHT ARM  Final   Special Requests   Final    BOTTLES DRAWN AEROBIC ONLY Blood Culture adequate volume   Culture NO GROWTH 5 DAYS  Final   Report Status 09/12/2017 FINAL  Final  MRSA PCR Screening     Status: None   Collection Time: 09/07/17 10:34 PM  Result Value Ref Range Status   MRSA by PCR NEGATIVE NEGATIVE Final    Comment:        The GeneXpert MRSA Assay (FDA approved for NASAL specimens only), is one component of a comprehensive MRSA colonization surveillance program. It is not intended to diagnose MRSA infection nor to guide or monitor treatment for MRSA infections.       Radiology Studies: Dg Chest Port 1 View  Result Date: 09/16/2017 CLINICAL DATA:  Shortness of Breath EXAM: PORTABLE CHEST 1 VIEW COMPARISON:  09/13/2017 FINDINGS: Right AICD remains in place, unchanged. Linear atelectasis in the left upper lobe and in the bases with low lung volumes. No effusions. Heart is borderline in size. IMPRESSION: Areas of bibasilar and left upper lobe atelectasis. Electronically Signed   By: Rolm Baptise M.D.   On: 09/16/2017 09:43     Scheduled Meds: . arformoterol  15 mcg Nebulization BID  . budesonide (PULMICORT) nebulizer solution  0.25 mg Nebulization BID  . furosemide  40 mg Oral Daily  .  ipratropium  0.5 mg Nebulization BID  . levalbuterol  1.25 mg Nebulization BID  . levETIRAcetam  500 mg Oral BID  . loratadine  10 mg Oral Daily  . metoprolol tartrate  37.5 mg Oral BID  . multivitamin with minerals  1 tablet Oral Daily  . pantoprazole  40 mg Oral Q0600  . polyethylene glycol  17 g Oral Daily  . senna-docusate  1 tablet Oral BID  . sorbitol  20 mL Oral Daily  . tamsulosin  0.4 mg Oral Daily  . warfarin  3 mg Oral ONCE-1800  . Warfarin - Pharmacist Dosing Inpatient   Does not apply q1800   Continuous Infusions:   LOS: 9 days    Time spent: 30 min    Janece Canterbury, MD [facility] Pager [phone]  If 7PM-7AM, please contact night-coverage [email or web address] Password [email or web address] 09/16/2017, 4:55 PM

## 2017-09-16 NOTE — Plan of Care (Signed)
Problem: Tissue Perfusion: Goal: Risk factors for ineffective tissue perfusion will decrease Outcome: Progressing Patient compliant with bipap and O2 this shift  Problem: Activity: Goal: Risk for activity intolerance will decrease Outcome: Progressing Patient voiced eagerness to ambulate this morning and tolerated fair. Patient voiced wanting to sit up in the chair this morning

## 2017-09-16 NOTE — Progress Notes (Signed)
DAILY PROGRESS NOTE   Patient Name: Travis Cruz Date of Encounter: 09/16/2017  Hospital Problem List   Principal Problem:   Hip fracture requiring operative repair [facility]) Active Problems:   Deep vein thrombosis (DVT) (HCC)   HTN (hypertension)   NSTEMI (non-ST elevated myocardial infarction) ([place])   Ischemic cardiomyopathy   HLD (hyperlipidemia)   CAD (coronary atherosclerotic disease)   Obstructive sleep apnea   COPD (chronic obstructive pulmonary disease) ([place])   History of DVT (deep vein thrombosis)   Hip fracture (HCC)   Closed left hip fracture, initial encounter (HCC)   Supratherapeutic INR   Acute diastolic CHF (congestive heart failure), NYHA class 3 ([place])   Preoperative cardiovascular examination    Chief Complaint   Feels well  Subjective   Recovering from surgery. Asked to help with medication management. Has been tachycardic. BB has been started and increased. He has a PPM, so there is no risk of bradycardia.  Objective   Vitals:   09/16/17 0400 09/16/17 0500 09/16/17 0800 09/16/17 0901  BP: 107/72 98/70 100/71 119/74  Pulse: 85 81  96  Resp: _0 Temp:   (!) 97.5 F (36.4 C)   TempSrc:   Axillary   SpO2: 93% 96%  93%  Weight:      Height:        Intake/Output Summary (Last 24 hours) at 09/16/17 1330 Last data filed at 09/16/17 0511  Gross per 24 hour  Intake                0 ml  Output             1225 ml  Net            -1225 ml   Filed Weights   09/07/17 0910 09/09/17 1142  Weight: 218 lb (98.9 kg) 218 lb (98.9 kg)    Physical Exam   General appearance: alert and no distress Neck: no carotid bruit, no JVD and thyroid not enlarged, symmetric, no tenderness/mass/nodules Lungs: clear to auscultation bilaterally Heart: regular tachycardia Abdomen: soft, non-tender; bowel sounds normal; no masses,  no organomegaly Extremities: recent surgery Pulses: 2+ and symmetric Skin: multiple ecchymoses Neurologic: Grossly  normal Psych: Pleasant  Inpatient Medications    Scheduled Meds: . arformoterol  15 mcg Nebulization BID  . budesonide (PULMICORT) nebulizer solution  0.25 mg Nebulization BID  . furosemide  40 mg Oral Daily  . ipratropium  0.5 mg Nebulization BID  . levalbuterol  1.25 mg Nebulization BID  . levETIRAcetam  500 mg Oral BID  . loratadine  10 mg Oral Daily  . metoprolol tartrate  37.5 mg Oral BID  . multivitamin with minerals  1 tablet Oral Daily  . pantoprazole  40 mg Oral Q0600  . polyethylene glycol  17 g Oral Daily  . senna-docusate  1 tablet Oral BID  . sorbitol  20 mL Oral Daily  . tamsulosin  0.4 mg Oral Daily  . warfarin  3 mg Oral ONCE-1800  . Warfarin - Pharmacist Dosing Inpatient   Does not apply q1800    Continuous Infusions:   PRN Meds: acetaminophen **OR** acetaminophen, ipratropium, levalbuterol, morphine injection, ondansetron **OR** ondansetron (ZOFRAN) IV, oxyCODONE   Labs   Results for orders placed or performed during the hospital encounter of 09/07/17 (from the past 48 hour(s))  CBC with Differential/Platelet     Status: Abnormal   Collection Time: 09/14/17  4:13 PM  Result Value Ref Range  WBC 17.0 (H) 4.0 - 10.5 K/uL   RBC 4.89 4.22 - 5.81 MIL/uL   Hemoglobin 13.2 13.0 - 17.0 g/dL   HCT 43.8 39.0 - 52.0 %   MCV 89.6 78.0 - 100.0 fL   MCH 27.0 26.0 - 34.0 pg   MCHC 30.1 30.0 - 36.0 g/dL   RDW 16.3 (H) 11.5 - 15.5 %   Platelets PLATELET CLUMPS NOTED ON SMEAR, UNABLE TO ESTIMATE 150 - 400 K/uL   Neutrophils Relative % 75 %   Neutro Abs 12.8 (H) 1.7 - 7.7 K/uL   Lymphocytes Relative 12 %   Lymphs Abs 2.1 0.7 - 4.0 K/uL   Monocytes Relative 12 %   Monocytes Absolute 2.0 (H) 0.1 - 1.0 K/uL   Eosinophils Relative 1 %   Eosinophils Absolute 0.1 0.0 - 0.7 K/uL   Basophils Relative 0 %   Basophils Absolute 0.0 0.0 - 0.1 K/uL  Magnesium     Status: None   Collection Time: 09/14/17 11:15 PM  Result Value Ref Range   Magnesium 2.0 1.7 - 2.4 mg/dL   Protime-INR     Status: Abnormal   Collection Time: 09/15/17  3:52 AM  Result Value Ref Range   Prothrombin Time 25.5 (H) 11.4 - 15.2 seconds   INR 2.35   Protime-INR     Status: Abnormal   Collection Time: 09/16/17  3:42 AM  Result Value Ref Range   Prothrombin Time 30.0 (H) 11.4 - 15.2 seconds   INR 2.89   Lactate dehydrogenase     Status: Abnormal   Collection Time: 09/16/17  3:42 AM  Result Value Ref Range   LDH 215 (H) 98 - 192 U/L  CBC     Status: Abnormal   Collection Time: 09/16/17  3:42 AM  Result Value Ref Range   WBC 15.5 (H) 4.0 - 10.5 K/uL   RBC 4.92 4.22 - 5.81 MIL/uL   Hemoglobin 13.7 13.0 - 17.0 g/dL   HCT 43.8 39.0 - 52.0 %   MCV 89.0 78.0 - 100.0 fL   MCH 27.8 26.0 - 34.0 pg   MCHC 31.3 30.0 - 36.0 g/dL   RDW 15.8 (H) 11.5 - 15.5 %   Platelets 242 150 - 400 K/uL  Comprehensive metabolic panel     Status: Abnormal   Collection Time: 09/16/17  3:42 AM  Result Value Ref Range   Sodium 139 135 - 145 mmol/L   Potassium 3.8 3.5 - 5.1 mmol/L   Chloride 100 (L) 101 - 111 mmol/L   CO2 33 (H) 22 - 32 mmol/L   Glucose, Bld 120 (H) 65 - 99 mg/dL   BUN 25 (H) 6 - 20 mg/dL   Creatinine, Ser 1.04 0.61 - 1.24 mg/dL   Calcium 9.4 8.9 - 10.3 mg/dL   Total Protein 6.8 6.5 - 8.1 g/dL   Albumin 2.6 (L) 3.5 - 5.0 g/dL   AST 55 (H) 15 - 41 U/L   ALT 74 (H) 17 - 63 U/L   Alkaline Phosphatase 125 38 - 126 U/L   Total Bilirubin 1.2 0.3 - 1.2 mg/dL   GFR calc non Af Amer >60 >60 mL/min   GFR calc Af Amer >60 >60 mL/min    Comment: (NOTE) The eGFR has been calculated using the CKD EPI equation. This calculation has not been validated in all clinical situations. eGFR's persistently <60 mL/min signify possible Chronic Kidney Disease.    Anion gap 6 5 - 15    ECG   Ordered  Telemetry   Regular tachycardia - ?sinus tachy vs. Atrial flutter - Personally Reviewed  Radiology    Dg Chest Port 1 View  Result Date: 09/16/2017 CLINICAL DATA:  Shortness of Breath EXAM:  PORTABLE CHEST 1 VIEW COMPARISON:  09/13/2017 FINDINGS: Right AICD remains in place, unchanged. Linear atelectasis in the left upper lobe and in the bases with low lung volumes. No effusions. Heart is borderline in size. IMPRESSION: Areas of bibasilar and left upper lobe atelectasis. Electronically Signed   By: Rolm Baptise M.D.   On: 09/16/2017 09:43    Cardiac Studies   N/A  Assessment   1. Principal Problem: 2.   Hip fracture requiring operative repair ([place]) 3. Active Problems: 4.   Deep vein thrombosis (DVT) (HCC) 5.   HTN (hypertension) 6.   NSTEMI (non-ST elevated myocardial infarction) ([place]) 7.   Ischemic cardiomyopathy 8.   HLD (hyperlipidemia) 9.   CAD (coronary atherosclerotic disease) 10.   Obstructive sleep apnea 11.   COPD (chronic obstructive pulmonary disease) ([place]) 12.   History of DVT (deep vein thrombosis) 13.   Hip fracture ([place]) 14.   Closed left hip fracture, initial encounter ([place]) 15.   Supratherapeutic INR 16.   Acute diastolic CHF (congestive heart failure), NYHA class 3 ([place]) 17.   Preoperative cardiovascular examination 18.   Plan   1. I have reviewed telemetry - ?if there is a 3:1 atrial flutter versus sinus tach. Etiology for sinus tachy is unclear - no fever, significant pain, ?dehydration (significant diuresis).  Will check 12 lead EKG and have his pacemaker interrogated today. He is already committed toward lifelong warfarin.   Time Spent Directly with Patient:  I have spent a total of 15 minutes with the patient reviewing hospital notes, telemetry, EKGs, labs and examining the patient as well as establishing an assessment and plan that was discussed personally with the patient. > 50% of time was spent in direct patient care.  Length of Stay:  LOS: 9 days   Pixie Casino, MD, [place]  Attending Cardiologist  Direct Dial: [phone]  Fax: [phone]  Website:  [email or web address]  Grover Robinson 09/16/2017, 1:30 PM

## 2017-09-17 DIAGNOSIS — R000 Tachycardia, unspecified: Secondary | ICD-10-CM

## 2017-09-17 LAB — PROTIME-INR
INR: 3
Prothrombin Time: 30.9 seconds — ABNORMAL HIGH (ref 11.4–15.2)

## 2017-09-17 LAB — HAPTOGLOBIN: Haptoglobin: 80 mg/dL (ref 34–200)

## 2017-09-17 MED ORDER — WARFARIN SODIUM 3 MG PO TABS
3.0000 mg | ORAL_TABLET | Freq: Once | ORAL | Status: AC
  Administered 2017-09-17: 3 mg via ORAL
  Filled 2017-09-17: qty 1

## 2017-09-17 MED ORDER — FUROSEMIDE 10 MG/ML IJ SOLN
40.0000 mg | Freq: Two times a day (BID) | INTRAMUSCULAR | Status: DC
  Administered 2017-09-17 – 2017-09-18 (×3): 40 mg via INTRAVENOUS
  Filled 2017-09-17 (×3): qty 4

## 2017-09-17 NOTE — Progress Notes (Signed)
Occupational Therapy Treatment Patient Details Name: Travis Cruz MRN: [id number] DOB: [birth year] Today's Date: 09/17/2017    History of present illness Patient is a 71 year old male with a history of ICD, coronary artery disease, CHF, COPD, prior PE, lung cancer who was admitted 09/07/17 after a fall. Pt is s/p Nailing of Left Intertrochanteric Femur Fracture.   OT comments  This 71 yo male admitted with above presents to acute OT today making progress with toileting transfers, overall general mobility, and standing balance. He will continue to benefit from acute OT with follow up OT at SNF to get to a Mod I level. Sats remained in low to mid 90's on 4 liters on HFNC and HR up to 137 with activity.  Follow Up Recommendations  SNF;Supervision/Assistance - 24 hour    Equipment Recommendations  Other (comment) (TBD next venue)       Precautions / Restrictions Precautions Precautions: Fall Precaution Comments: watch HR and O2 Restrictions Weight Bearing Restrictions: No LLE Weight Bearing: Weight bearing as tolerated       Mobility Bed Mobility Overal bed mobility: Needs Assistance Bed Mobility: Supine to Sit     Supine to sit: +2 for safety/equipment;HOB elevated;Min assist     General bed mobility comments: Assist to bring LLE off of bed and to elevate trunk into sitting  Transfers Overall transfer level: Needs assistance Equipment used: Rolling walker (2 wheeled);Ambulation equipment used Transfers: Sit to/from Stand Sit to Stand: Min assist;+2 safety/equipment         General transfer comment: Assist to bring hips up and for balance    Balance Overall balance assessment: Needs assistance Sitting-balance support: Feet supported;No upper extremity supported Sitting balance-Leahy Scale: Fair     Standing balance support: During functional activity;Single extremity supported Standing balance-Leahy Scale: Poor Standing balance comment: walker and min guard  assist to maintain static standing.                           ADL either performed or assessed with clinical judgement   ADL Overall ADL's : Needs assistance/impaired     Grooming: Wash/dry face;Min guard;Standing                   Toilet Transfer: Minimal assistance;+2 for safety/equipment;Ambulation;RW Toilet Transfer Details (indicate cue type and reason): Bed>out into hallway>sit in recliner behind him                 Vision Patient Visual Report: No change from baseline            Cognition Arousal/Alertness: Awake/alert Behavior During Therapy: WFL for tasks assessed/performed Overall Cognitive Status: Impaired/Different from baseline Area of Impairment: Safety/judgement;Problem solving;Memory                     Memory: Decreased short-term memory   Safety/Judgement: Decreased awareness of safety   Problem Solving: Slow processing;Difficulty sequencing;Requires verbal cues                     Pertinent Vitals/ Pain       Pain Assessment: No/denies pain         Frequency  Min 2X/week        Progress Toward Goals  OT Goals(current goals can now be found in the care plan section)  Progress towards OT goals: Progressing toward goals     Plan Discharge plan remains appropriate    Co-evaluation    PT/OT/SLP Co-Evaluation/Treatment:  Yes Reason for Co-Treatment: For patient/therapist safety PT goals addressed during session: Mobility/safety with mobility OT goals addressed during session: ADL's and self-care      AM-PAC PT "6 Clicks" Daily Activity     Outcome Measure   Help from another person eating meals?: None Help from another person taking care of personal grooming?: A Little Help from another person toileting, which includes using toliet, bedpan, or urinal?: A Lot Help from another person bathing (including washing, rinsing, drying)?: A Lot Help from another person to put on and taking off regular  upper body clothing?: A Little Help from another person to put on and taking off regular lower body clothing?: A Lot 6 Click Score: 16    End of Session Equipment Utilized During Treatment: Oxygen (4 liters HFNC)  OT Visit Diagnosis: Unsteadiness on feet (R26.81);Other abnormalities of gait and mobility (R26.89);History of falling (Z91.81)   Activity Tolerance Patient tolerated treatment well   Patient Left in chair;with call bell/phone within reach;with family/visitor present   Nurse Communication Mobility status        Time: 9937-1696 OT Time Calculation (min): 26 min  Charges: OT General Charges $OT Visit: 1 Visit OT Treatments $Self Care/Home Management : 8-22 mins  [place], OTR/L [phone] 09/17/2017

## 2017-09-17 NOTE — Progress Notes (Signed)
PROGRESS NOTE  Travis Cruz  TDD:[id number] DOB: 11-16-1946 DOA: 09/07/2017 PCP: Timmothy Euler, MD  Brief Narrative:   63 CAD, prior V. fib arrest 2014-severe single-vessel CAD Ischemic cardiomyopathy with systolic and diastolic heart failure last EF 50-55% 06/2016-prior ICD placement Lung nodule which is malignant and prior pulmonary embolism COPD HTN OSA on CPAP Chronic respiratory failure on home oxygen  Admitted on 09/07/17 with complex hip [facility] by [facility]  mild dehydration, mild rhabdomyolysis with CK of 1265 Mild leukocytosis with x-ray showing cardiomegaly and lung scarring Cardiology consulted, orthopedics consulted  High risk for surgery  Underwent surgery but postoperative course was complicated by volume overload, acute hypoxic respiratory failure initially necessitating BiPAP.  Still requiring high flow nasal canula.    Assessment & Plan:   Principal Problem:   Hip fracture requiring operative repair [facility]) Active Problems:   Deep vein thrombosis (DVT) (HCC)   HTN (hypertension)   NSTEMI (non-ST elevated myocardial infarction) ([place])   Ischemic cardiomyopathy   HLD (hyperlipidemia)   CAD (coronary atherosclerotic disease)   Obstructive sleep apnea   COPD (chronic obstructive pulmonary disease) (HCC)   History of DVT (deep vein thrombosis)   Hip fracture (HCC)   Closed left hip fracture, initial encounter (HCC)   Supratherapeutic INR   Acute diastolic CHF (congestive heart failure), NYHA class 3 (HCC)   Preoperative cardiovascular examination  Acute on chronic hypoxic respiratory failure (uses home oxygen 3 L bled into bipap at night, but no oxygen during day according to wife), likely due to to acute on chronic diastolic heart failure and atelectasis in the setting of known COPD/OSA.  Did not have much improvement despite addressing atelectasis yesterday so will  dry to diurese more. -  Resume lasix 40mg  IV BID -  CXR:  Atelectasis -  OOB and incentive spirometry encouraged -  Needs bipap at night  -  Attempting to transition to 2-3L [place]  Complex left hip proximal femur fracture status post intramedullary nailing left 9/12 - Coumadin lifelong  - Appreciate wound care assistance - Complains of pain limiting his mobility, but using very little pain medication - Appreciate orthopedics assistance  History of DVT and PE-also history of PFO 2012 and stroke - unlikely to have DVT in the setting of chronic anticoagulation with therapeutic INR  Leukocytosis + thrombocytopenia, 14 mm NSCLC stage I status post XRT 02/2017 -  Thrombocytopenia resolved  Prior history V. fib arrest status post pacemaker 07/01/2013  CAD with single-vessel disease with tachycardia and NSVT 3-4 beats -  Continue metoprolol 37.5mg  BID -  Appreciate cardiology input -  Holding statin due to mild transaminitis              2 GI bleeds in 2014 secondary to colonic polyp 06/30/2013-Dr. buccini -  Careful with anticoagulation, keep the low range of therapeutic              Seizure disorder, stable, continue keppra 500 2 tabs twice a day.  Left ankle pain             Swelling in the left ankle is diminished and x-ray 9/12 was negative for acute fracture  Urinary retention, acute, foley replaced -  Continue flomax started during this admission -  F/u with Urology as outpatient   DVT prophylaxis:  Warfarin  Code Status:  Full code Family Communication:  Patient and wife at bedside Disposition Plan:  Transitioning off  HFNC  Consultants:   Cardiology  Orthopedic surgery, Dr. Doreatha Martin   Procedures:   Hip nailing L 9/12  ECHO 9/11 Study Conclusions - Left ventricle: The cavity size was normal. Wall thickness was normal. Systolic function was normal. The estimated ejection fraction was in the range of 55% to 60%. Wall motion was normal; there were no  regional wall motion abnormalities. - Left atrium: The atrium was mildly dilated. - Normal LV function; mild LAE.  Antimicrobials:  Anti-infectives    Start     Dose/Rate Route Frequency Ordered Stop   09/09/17 2100  ceFAZolin (ANCEF) IVPB 2g/100 mL premix  Status:  Discontinued     2 g 200 mL/hr over 30 Minutes Intravenous Every 8 hours 09/09/17 1609 09/09/17 1755   09/09/17 1145  ceFAZolin (ANCEF) IVPB 2g/100 mL premix     2 g 200 mL/hr over 30 Minutes Intravenous On call to O.R. 09/09/17 1143 09/09/17 1312   09/08/17 0300  vancomycin (VANCOCIN) 1,250 mg in sodium chloride 0.9 % 250 mL IVPB  Status:  Discontinued     1,250 mg 166.7 mL/hr over 90 Minutes Intravenous Every 12 hours 09/07/17 2248 09/09/17 1755   09/07/17 2200  piperacillin-tazobactam (ZOSYN) IVPB 3.375 g  Status:  Discontinued     3.375 g 12.5 mL/hr over 240 Minutes Intravenous Every 8 hours 09/07/17 1654 09/12/17 0957   09/07/17 1500  vancomycin (VANCOCIN) 1,500 mg in sodium chloride 0.9 % 500 mL IVPB     1,500 mg 250 mL/hr over 120 Minutes Intravenous  Once 09/07/17 1411 09/07/17 1714   09/07/17 1415  piperacillin-tazobactam (ZOSYN) IVPB 3.375 g     3.375 g 100 mL/hr over 30 Minutes Intravenous STAT 09/07/17 1411 09/07/17 1458       Subjective:  Had a hard time leaving on bipap overnight and took it off several times.  Unable to transition from HFNC yesterday despite using IS and sitting in chair for a prolonged period.  Pain in left hip and foot are improving.     Objective: Vitals:   09/17/17 0800 09/17/17 0843 09/17/17 1000 09/17/17 1100  BP: 103/63 103/63 110/77 109/73  Pulse: 92 94 (!) 101 (!) 109  Resp: 16 19 17  (!) 22  Temp:  98 F (36.7 C)    TempSrc:  Oral    SpO2: 95% (!) 89% 90% 93%  Weight:      Height:        Intake/Output Summary (Last 24 hours) at 09/17/17 1225 Last data filed at 09/17/17 0800  Gross per 24 hour  Intake              120 ml  Output              950 ml  Net              -830 ml   Filed Weights   09/07/17 0910 09/09/17 1142  Weight: 98.9 kg (218 lb) 98.9 kg (218 lb)    Examination:  General exam:  Adult male, sitting in bed, mildly tachypneic on 4L [place] without accessory muscle use HEENT:  NCAT, MMM Respiratory system: Rales at the left base, no wheezes or rhonchi Cardiovascular system: mildly tachycardic, normal S1/S2. No murmurs, rubs, gallops or clicks.  Warm extremities Gastrointestinal system: Normal active bowel sounds, soft, nondistended, nontender. MSK:  Normal tone and bulk, 2+ pitting edema of the left lower extremity with persistent bruising of the left foot.  Indurated in area around left hip, but no  warmth or erythema or palpable hematoma.  Dressing is C/D/I. Neuro:  Grossly intact  Data Reviewed: I have personally reviewed following labs and imaging studies  CBC:  Recent Labs Lab 09/11/17 0804 09/13/17 0808 09/14/17 1613 09/16/17 0342  WBC 15.4* 13.3* 17.0* 15.5*  NEUTROABS 10.2*  --  12.8*  --   HGB 13.2 12.4* 13.2 13.7  HCT 43.5 39.8 43.8 43.8  MCV 92.6 91.3 89.6 89.0  PLT PLATELET CLUMPING, SUGGEST RECOLLECTION OF SAMPLE IN CITRATE TUBE. 80* PLATELET CLUMPS NOTED ON SMEAR, UNABLE TO ESTIMATE 619   Basic Metabolic Panel:  Recent Labs Lab 09/11/17 0804 09/13/17 0808 09/14/17 2315 09/16/17 0342  NA 139 137  --  139  K 4.0 4.0  --  3.8  CL 96* 101  --  100*  CO2 35* 29  --  33*  GLUCOSE 93 123*  --  120*  BUN 15 20  --  25*  CREATININE 1.19 0.94  --  1.04  CALCIUM 8.3* 8.6*  --  9.4  MG  --   --  2.0  --   PHOS  --  2.5  --   --    GFR: Estimated Creatinine Clearance: 77.8 mL/min (by C-G formula based on SCr of 1.04 mg/dL). Liver Function Tests:  Recent Labs Lab 09/11/17 0804 09/13/17 0808 09/16/17 0342  AST 47*  --  55*  ALT 23  --  74*  ALKPHOS 83  --  125  BILITOT 1.3*  --  1.2  PROT 6.5  --  6.8  ALBUMIN 2.5* 2.4* 2.6*   No results for input(s): LIPASE, AMYLASE in the last 168 hours. No results  for input(s): AMMONIA in the last 168 hours. Coagulation Profile:  Recent Labs Lab 09/13/17 0325 09/14/17 0429 09/15/17 0352 09/16/17 0342 09/17/17 0326  INR 1.89 1.80 2.35 2.89 3.00   Cardiac Enzymes: No results for input(s): CKTOTAL, CKMB, CKMBINDEX, TROPONINI in the last 168 hours. BNP (last 3 results) No results for input(s): PROBNP in the last 8760 hours. HbA1C: No results for input(s): HGBA1C in the last 72 hours. CBG: No results for input(s): GLUCAP in the last 168 hours. Lipid Profile: No results for input(s): CHOL, HDL, LDLCALC, TRIG, CHOLHDL, LDLDIRECT in the last 72 hours. Thyroid Function Tests: No results for input(s): TSH, T4TOTAL, FREET4, T3FREE, THYROIDAB in the last 72 hours. Anemia Panel: No results for input(s): VITAMINB12, FOLATE, FERRITIN, TIBC, IRON, RETICCTPCT in the last 72 hours. Urine analysis:    Component Value Date/Time   COLORURINE AMBER (A) 09/07/2017 0953   APPEARANCEUR CLOUDY (A) 09/07/2017 0953   APPEARANCEUR Clear 04/24/2016 0811   LABSPEC 1.025 09/07/2017 0953   PHURINE 5.0 09/07/2017 0953   GLUCOSEU NEGATIVE 09/07/2017 0953   HGBUR NEGATIVE 09/07/2017 0953   BILIRUBINUR NEGATIVE 09/07/2017 0953   BILIRUBINUR Negative 04/24/2016 0811   KETONESUR 5 (A) 09/07/2017 0953   PROTEINUR 100 (A) 09/07/2017 0953   UROBILINOGEN 1.0 01/31/2013 1035   NITRITE NEGATIVE 09/07/2017 0953   LEUKOCYTESUR NEGATIVE 09/07/2017 0953   LEUKOCYTESUR Negative 04/24/2016 0811   Sepsis Labs: @LABRCNTIP (procalcitonin:4,lacticidven:4)  ) Recent Results (from the past 240 hour(s))  Culture, blood (routine x 2)     Status: None   Collection Time: 09/07/17 12:33 PM  Result Value Ref Range Status   Specimen Description BLOOD RIGHT ARM  Final   Special Requests   Final    BOTTLES DRAWN AEROBIC ONLY Blood Culture adequate volume   Culture NO GROWTH 5 DAYS  Final  Report Status 09/12/2017 FINAL  Final  MRSA PCR Screening     Status: None   Collection Time:  09/07/17 10:34 PM  Result Value Ref Range Status   MRSA by PCR NEGATIVE NEGATIVE Final    Comment:        The GeneXpert MRSA Assay (FDA approved for NASAL specimens only), is one component of a comprehensive MRSA colonization surveillance program. It is not intended to diagnose MRSA infection nor to guide or monitor treatment for MRSA infections.       Radiology Studies: Dg Chest Port 1 View  Result Date: 09/16/2017 CLINICAL DATA:  Shortness of Breath EXAM: PORTABLE CHEST 1 VIEW COMPARISON:  09/13/2017 FINDINGS: Right AICD remains in place, unchanged. Linear atelectasis in the left upper lobe and in the bases with low lung volumes. No effusions. Heart is borderline in size. IMPRESSION: Areas of bibasilar and left upper lobe atelectasis. Electronically Signed   By: Rolm Baptise M.D.   On: 09/16/2017 09:43     Scheduled Meds: . arformoterol  15 mcg Nebulization BID  . budesonide (PULMICORT) nebulizer solution  0.25 mg Nebulization BID  . furosemide  40 mg Intravenous BID  . ipratropium  0.5 mg Nebulization BID  . levalbuterol  1.25 mg Nebulization BID  . levETIRAcetam  500 mg Oral BID  . loratadine  10 mg Oral Daily  . metoprolol tartrate  37.5 mg Oral BID  . multivitamin with minerals  1 tablet Oral Daily  . pantoprazole  40 mg Oral Q0600  . polyethylene glycol  17 g Oral Daily  . senna-docusate  1 tablet Oral BID  . sorbitol  20 mL Oral Daily  . tamsulosin  0.4 mg Oral Daily  . warfarin  3 mg Oral ONCE-1800  . Warfarin - Pharmacist Dosing Inpatient   Does not apply q1800   Continuous Infusions:   LOS: 10 days    Time spent: 30 min    Janece Canterbury, MD [facility] Pager [phone]  If 7PM-7AM, please contact night-coverage [email or web address] Password [facility] 09/17/2017, 12:25 PM

## 2017-09-17 NOTE — Progress Notes (Signed)
EKG demonstrates sinus tach - PPM interrogation was not helpful, since he does not have an atrial lead. Would work to optimize b-blocker to keep HR <100. Looks like this was the case overnight. Follow-up with Dr. Caryl Comes after discharge.  Will sign-off. Call with questions.  Pixie Casino, MD, [place]  Attending Cardiologist  Direct Dial: [phone]  Fax: [phone]  Website:  [email or web address]

## 2017-09-17 NOTE — Progress Notes (Signed)
[place] for warfarin Indication: history of DVT/PE  Allergies  Allergen Reactions  . Ativan [Lorazepam] Anxiety and Other (See Comments)    Hyper  [name]    Patient Measurements: Height: 5\' 9"  (175.3 cm) Weight: 218 lb (98.9 kg) IBW/kg (Calculated) : 70.7  Vital Signs: Temp: 98.8 F (37.1 C) (09/20 0300) Temp Source: Axillary (09/20 0300) BP: 103/63 (09/20 0800) Pulse Rate: 92 (09/20 0800)  Labs:  Recent Labs  09/14/17 1613 09/15/17 0352 09/16/17 0342 09/17/17 0326  HGB 13.2  --  13.7  --   HCT 43.8  --  43.8  --   PLT PLATELET CLUMPS NOTED ON SMEAR, UNABLE TO ESTIMATE  --  242  --   LABPROT  --  25.5* 30.0* 30.9*  INR  --  2.35 2.89 3.00  CREATININE  --   --  1.04  --     Estimated Creatinine Clearance: 77.8 mL/min (by C-G formula based on SCr of 1.04 mg/dL).   Assessment: 71 yo male on warfarin prior to arrival for history of PE and DVT on life long anticoagulation admitted 9/11 with left femur fracture. S/p nailing on 9/12. Warfarin resumed 9/14.   INR therapeutic at 3 Prior to arrival dose: 3 mg daily   Goal of Therapy:  INR 2-3 Monitor platelets by anticoagulation protocol: Yes   Plan:  1. Warfarin 3 mg x 1 this evening  2. Daily INR   Norva Riffle Clinical Pharmacist Clinical phone for 09/17/2017 from 7a-3:30p: [phone] If after 3:30p, please call main pharmacy at: [phone] 09/17/2017 8:32 AM

## 2017-09-17 NOTE — Progress Notes (Signed)
Physical Therapy Treatment Patient Details Name: Travis Cruz MRN: [id number] DOB: 1946-05-19 Today's Date: 09/17/2017    History of Present Illness Patient is a 71 year old male with a history of ICD, coronary artery disease, CHF, COPD, prior PE, lung cancer who was admitted 09/07/17 after a fall. Pt is s/p Nailing of Left Intertrochanteric Femur Fracture.    PT Comments    Pt continues to make good progress with mobility. SpO2 good >95% on 4L with amb. HR to 130's with amb.   Follow Up Recommendations  SNF     Equipment Recommendations  Other (comment) (To be assessed at next venue)    Recommendations for Other Services       Precautions / Restrictions Precautions Precautions: Fall;Other (comment) Precaution Comments: watch HR and O2 Restrictions Weight Bearing Restrictions: Yes LLE Weight Bearing: Weight bearing as tolerated    Mobility  Bed Mobility Overal bed mobility: Needs Assistance Bed Mobility: Supine to Sit     Supine to sit: +2 for safety/equipment;HOB elevated;Min assist     General bed mobility comments: Assist to bring LLE off of bed and to elevate trunk into sitting  Transfers Overall transfer level: Needs assistance Equipment used: Rolling walker (2 wheeled);Ambulation equipment used Transfers: Sit to/from Stand Sit to Stand: Min assist;+2 safety/equipment         General transfer comment: Assist to bring hips up and for balance  Ambulation/Gait Ambulation/Gait assistance: +2 safety/equipment;Min assist (chair follow) Ambulation Distance (Feet): 20 Feet (20' x 1, 15' x 1) Assistive device: Rolling walker (2 wheeled) Gait Pattern/deviations: Step-to pattern;Decreased step length - right;Decreased step length - left;Decreased stance time - left;Decreased weight shift to left;Narrow base of support;Antalgic Gait velocity: de Gait velocity interpretation: Below normal speed for age/gender General Gait Details: Assist for support and  balance. Pt amb on 4L of O2 with SpO2>95%. HR to 130's with amb   Stairs            Wheelchair Mobility    Modified Rankin (Stroke Patients Only)       Balance Overall balance assessment: Needs assistance Sitting-balance support: Feet supported;No upper extremity supported Sitting balance-Leahy Scale: Fair     Standing balance support: During functional activity;Single extremity supported Standing balance-Leahy Scale: Poor Standing balance comment: walker and min guard assist to maintain static standing.                            Cognition Arousal/Alertness: Awake/alert Behavior During Therapy: WFL for tasks assessed/performed Overall Cognitive Status: Impaired/Different from baseline Area of Impairment: Safety/judgement;Problem solving;Memory                     Memory: Decreased short-term memory   Safety/Judgement: Decreased awareness of safety   Problem Solving: Slow processing;Difficulty sequencing;Requires verbal cues        Exercises      General Comments        Pertinent Vitals/Pain      Home Living                      Prior Function            PT Goals (current goals can now be found in the care plan section) Progress towards PT goals: Progressing toward goals    Frequency    Min 3X/week      PT Plan Current plan remains appropriate    Co-evaluation PT/OT/SLP Co-Evaluation/Treatment: Yes Reason for Co-Treatment:  For patient/therapist safety PT goals addressed during session: Mobility/safety with mobility        AM-PAC PT "6 Clicks" Daily Activity  Outcome Measure  Difficulty turning over in bed (including adjusting bedclothes, sheets and blankets)?: A Lot Difficulty moving from lying on back to sitting on the side of the bed? : Unable Difficulty sitting down on and standing up from a chair with arms (e.g., wheelchair, bedside commode, etc,.)?: Unable Help needed moving to and from a bed to  chair (including a wheelchair)?: A Little Help needed walking in hospital room?: A Little Help needed climbing 3-5 steps with a railing? : Total 6 Click Score: 11    End of Session Equipment Utilized During Treatment: Gait belt;Oxygen Activity Tolerance: Patient tolerated treatment well Patient left: in chair;with chair alarm set;with call bell/phone within reach;with family/visitor present Nurse Communication: Mobility status PT Visit Diagnosis: Muscle weakness (generalized) (M62.81);History of falling (Z91.81);Difficulty in walking, not elsewhere classified (R26.2);Pain Pain - Right/Left: Left Pain - part of body: Leg     Time: 7412-8786 PT Time Calculation (min) (ACUTE ONLY): 17 min  Charges:  $Gait Training: 8-22 mins                    G Codes:       [facility] PT [place] 09/17/2017, 11:01 AM

## 2017-09-17 NOTE — Progress Notes (Signed)
Patient has home BIPAP unit at bedside. RT filled water chamber with sterile water and placed mask on patient with 3L O2 bleed in. Patient tolerating well at this time.

## 2017-09-17 NOTE — Plan of Care (Signed)
Problem: Education: Goal: Knowledge of [facility] Education information/materials will improve Outcome: Progressing Educated patient on need for bipap at night. Patient asked to be taken off multiple times during night and was redirected about why he should leave it on. Patient voiced understanding and remained on until 0630.

## 2017-09-17 NOTE — Progress Notes (Signed)
CSW informed by MD that patient not stable for DC today.  CSW updated [facility] and sent updated PT notes so they can updated insurance.  CSW will continue to follow  Jorge Ny, LCSW Clinical Social Worker [phone]

## 2017-09-18 DIAGNOSIS — D518 Other vitamin B12 deficiency anemias: Secondary | ICD-10-CM | POA: Diagnosis not present

## 2017-09-18 DIAGNOSIS — G4733 Obstructive sleep apnea (adult) (pediatric): Secondary | ICD-10-CM | POA: Diagnosis not present

## 2017-09-18 DIAGNOSIS — I82409 Acute embolism and thrombosis of unspecified deep veins of unspecified lower extremity: Secondary | ICD-10-CM | POA: Diagnosis not present

## 2017-09-18 DIAGNOSIS — I214 Non-ST elevation (NSTEMI) myocardial infarction: Secondary | ICD-10-CM | POA: Diagnosis not present

## 2017-09-18 DIAGNOSIS — S72009A Fracture of unspecified part of neck of unspecified femur, initial encounter for closed fracture: Secondary | ICD-10-CM | POA: Diagnosis not present

## 2017-09-18 DIAGNOSIS — E119 Type 2 diabetes mellitus without complications: Secondary | ICD-10-CM | POA: Diagnosis not present

## 2017-09-18 DIAGNOSIS — S72142D Displaced intertrochanteric fracture of left femur, subsequent encounter for closed fracture with routine healing: Secondary | ICD-10-CM | POA: Diagnosis not present

## 2017-09-18 DIAGNOSIS — R41841 Cognitive communication deficit: Secondary | ICD-10-CM | POA: Diagnosis not present

## 2017-09-18 DIAGNOSIS — D689 Coagulation defect, unspecified: Secondary | ICD-10-CM | POA: Diagnosis not present

## 2017-09-18 DIAGNOSIS — E785 Hyperlipidemia, unspecified: Secondary | ICD-10-CM | POA: Diagnosis not present

## 2017-09-18 DIAGNOSIS — I255 Ischemic cardiomyopathy: Secondary | ICD-10-CM | POA: Diagnosis not present

## 2017-09-18 DIAGNOSIS — E039 Hypothyroidism, unspecified: Secondary | ICD-10-CM | POA: Diagnosis not present

## 2017-09-18 DIAGNOSIS — I5031 Acute diastolic (congestive) heart failure: Secondary | ICD-10-CM | POA: Diagnosis not present

## 2017-09-18 DIAGNOSIS — I2510 Atherosclerotic heart disease of native coronary artery without angina pectoris: Secondary | ICD-10-CM | POA: Diagnosis not present

## 2017-09-18 DIAGNOSIS — I10 Essential (primary) hypertension: Secondary | ICD-10-CM | POA: Diagnosis not present

## 2017-09-18 DIAGNOSIS — J449 Chronic obstructive pulmonary disease, unspecified: Secondary | ICD-10-CM | POA: Diagnosis not present

## 2017-09-18 DIAGNOSIS — M6281 Muscle weakness (generalized): Secondary | ICD-10-CM | POA: Diagnosis not present

## 2017-09-18 DIAGNOSIS — I824Z9 Acute embolism and thrombosis of unspecified deep veins of unspecified distal lower extremity: Secondary | ICD-10-CM

## 2017-09-18 DIAGNOSIS — Z79899 Other long term (current) drug therapy: Secondary | ICD-10-CM | POA: Diagnosis not present

## 2017-09-18 DIAGNOSIS — E7800 Pure hypercholesterolemia, unspecified: Secondary | ICD-10-CM | POA: Diagnosis not present

## 2017-09-18 DIAGNOSIS — S72001C Fracture of unspecified part of neck of right femur, initial encounter for open fracture type IIIA, IIIB, or IIIC: Secondary | ICD-10-CM | POA: Diagnosis not present

## 2017-09-18 DIAGNOSIS — R339 Retention of urine, unspecified: Secondary | ICD-10-CM | POA: Diagnosis not present

## 2017-09-18 DIAGNOSIS — D649 Anemia, unspecified: Secondary | ICD-10-CM | POA: Diagnosis not present

## 2017-09-18 LAB — BASIC METABOLIC PANEL
Anion gap: 10 (ref 5–15)
BUN: 33 mg/dL — ABNORMAL HIGH (ref 6–20)
CO2: 30 mmol/L (ref 22–32)
Calcium: 9.7 mg/dL (ref 8.9–10.3)
Chloride: 98 mmol/L — ABNORMAL LOW (ref 101–111)
Creatinine, Ser: 1.04 mg/dL (ref 0.61–1.24)
GFR calc Af Amer: >60 mL/min (ref >60)
GFR calc non Af Amer: >60 mL/min (ref >60)
Glucose, Bld: 122 mg/dL — ABNORMAL HIGH (ref 65–99)
Potassium: 4.1 mmol/L (ref 3.5–5.1)
Sodium: 138 mmol/L (ref 135–145)

## 2017-09-18 LAB — CBC
HCT: 47 % (ref 39.0–52.0)
Hemoglobin: 15.1 g/dL (ref 13.0–17.0)
MCH: 28.7 pg (ref 26.0–34.0)
MCHC: 32.1 g/dL (ref 30.0–36.0)
MCV: 89.4 fL (ref 78.0–100.0)
Platelets: UNDETERMINED 10*3/uL (ref 150–400)
RBC: 5.26 MIL/uL (ref 4.22–5.81)
RDW: 15.8 % — ABNORMAL HIGH (ref 11.5–15.5)
WBC: 15.6 10*3/uL — ABNORMAL HIGH (ref 4.0–10.5)

## 2017-09-18 LAB — PROTIME-INR
INR: 3.12
Prothrombin Time: 31.8 seconds — ABNORMAL HIGH (ref 11.4–15.2)

## 2017-09-18 MED ORDER — WARFARIN SODIUM 2 MG PO TABS: 2.0000 mg | ORAL_TABLET | Freq: Every day | ORAL | 0 refills | Status: DC

## 2017-09-18 MED ORDER — WARFARIN SODIUM 2 MG PO TABS
2.0000 mg | ORAL_TABLET | Freq: Once | ORAL | Status: DC
  Filled 2017-09-18: qty 1

## 2017-09-18 MED ORDER — FUROSEMIDE 40 MG PO TABS: 40.0000 mg | ORAL_TABLET | Freq: Every day | ORAL | 0 refills | Status: DC

## 2017-09-18 MED ORDER — LEVALBUTEROL HCL 1.25 MG/0.5ML IN NEBU: 1.2500 mg | INHALATION_SOLUTION | RESPIRATORY_TRACT | 12 refills | Status: DC | PRN

## 2017-09-18 MED ORDER — TAMSULOSIN HCL 0.4 MG PO CAPS: 0.4000 mg | ORAL_CAPSULE | Freq: Every day | ORAL | 0 refills | Status: DC

## 2017-09-18 MED ORDER — METOPROLOL TARTRATE 37.5 MG PO TABS
37.5000 mg | ORAL_TABLET | Freq: Two times a day (BID) | ORAL | 0 refills | Status: DC
Start: 2017-09-18 — End: 2017-11-25

## 2017-09-18 NOTE — Plan of Care (Signed)
Problem: Physical Regulation: Goal: Ability to maintain clinical measurements within normal limits will improve Outcome: Progressing Patient would not leave bipap mask on for whole night. Only wore mask for 4 hours despite education about need. Patient still refusing turns at night.

## 2017-09-18 NOTE — Clinical Social Work Placement (Signed)
   CLINICAL SOCIAL WORK PLACEMENT  NOTE  Date:  09/18/2017  Patient Details  Name: Travis Cruz MRN: [id number] Date of Birth: 1946/10/20  Clinical Social Work is seeking post-discharge placement for this patient at the [place] level of care (*CSW will initial, date and re-position this form in  chart as items are completed):  Yes   Patient/family provided with [facility] Work Department's list of facilities offering this level of care within the geographic area requested by the patient (or if unable, by the patient's family).  Yes   Patient/family informed of their freedom to choose among providers that offer the needed level of care, that participate in Medicare, Medicaid or managed care program needed by the patient, have an available bed and are willing to accept the patient.  Yes   Patient/family informed of [facility]'s ownership interest in [facility] and [facility], as well as of the fact that they are under no obligation to receive care at these facilities.  PASRR submitted to EDS on 09/12/17     PASRR number received on 09/12/17     Existing PASRR number confirmed on       FL2 transmitted to all facilities in geographic area requested by pt/family on 09/12/17     FL2 transmitted to all facilities within larger geographic area on       Patient informed that his/her managed care company has contracts with or will negotiate with certain facilities, including the following:        Yes   Patient/family informed of bed offers received.  Patient chooses bed at [facility]     Physician recommends and patient chooses bed at      Patient to be transferred to [facility] on 09/18/17.  Patient to be transferred to facility by ptar     Patient family notified on 09/18/17 of transfer.  Name of family member notified:  [name]     PHYSICIAN Please sign FL2     Additional Comment:     _______________________________________________ Jorge Ny, LCSW 09/18/2017, 10:59 AM

## 2017-09-18 NOTE — Progress Notes (Signed)
Attempted to call report to receiving rehab RN. Was told would received call back as nurse is busy. Provided name, number, and relayed I am calling about incoming pt Travis Cruz

## 2017-09-18 NOTE — Progress Notes (Signed)
Gave report to Denny Peon, [place]. EMS here to transport pt. Bedside report given and pt transported off unit.

## 2017-09-18 NOTE — Progress Notes (Signed)
Physical Therapy Treatment Patient Details Name: Travis Cruz MRN: [id number] DOB: Dec 16, 1946 Today's Date: 09/18/2017    History of Present Illness Patient is a 71 year old male with a history of ICD, coronary artery disease, CHF, COPD, prior PE, lung cancer who was admitted 09/07/17 after a fall. Pt is s/p Nailing of Left Intertrochanteric Femur Fracture.    PT Comments    Pt continues to make steady progress. Tolerating activity on 3L of 02 with SpO2 >90%. Continue to recommend ST-SNF.    Follow Up Recommendations  SNF     Equipment Recommendations  Other (comment) (To be assessed at next venue)    Recommendations for Other Services       Precautions / Restrictions Precautions Precautions: Fall;Other (comment) Precaution Comments: watch HR and O2 Restrictions Weight Bearing Restrictions: Yes LLE Weight Bearing: Weight bearing as tolerated    Mobility  Bed Mobility Overal bed mobility: Needs Assistance Bed Mobility: Supine to Sit     Supine to sit: HOB elevated;Min assist     General bed mobility comments: Assist to bring LLE off of bed and to elevate trunk into sitting  Transfers Overall transfer level: Needs assistance Equipment used: Rolling walker (2 wheeled);Ambulation equipment used Transfers: Sit to/from Stand Sit to Stand: Min assist         General transfer comment: Assist to bring hips up and for balance. Verbal cues for hand placement. Bed to [facility] to bed with stand pivot with walker  Ambulation/Gait Ambulation/Gait assistance: +2 safety/equipment;Min assist (chair follow) Ambulation Distance (Feet): 45 Feet (x 2) Assistive device: Rolling walker (2 wheeled) Gait Pattern/deviations: Decreased step length - right;Decreased stance time - left;Decreased weight shift to left;Narrow base of support;Antalgic;Step-through pattern Gait velocity: decr Gait velocity interpretation: Below normal speed for age/gender General Gait Details: Assist for  support and balance. Pt amb on 3L of O2 with SpO2>90%. HR to 130's with amb   Stairs            Wheelchair Mobility    Modified Rankin (Stroke Patients Only)       Balance Overall balance assessment: Needs assistance Sitting-balance support: Feet supported;No upper extremity supported Sitting balance-Leahy Scale: Fair     Standing balance support: During functional activity;Single extremity supported Standing balance-Leahy Scale: Poor Standing balance comment: walker and min guard assist to maintain static standing.                            Cognition Arousal/Alertness: Awake/alert Behavior During Therapy: WFL for tasks assessed/performed Overall Cognitive Status: Impaired/Different from baseline Area of Impairment: Safety/judgement;Problem solving;Memory                     Memory: Decreased short-term memory   Safety/Judgement: Decreased awareness of safety   Problem Solving: Slow processing;Requires verbal cues        Exercises      General Comments        Pertinent Vitals/Pain Pain Assessment: Faces Faces Pain Scale: Hurts little more Pain Location: LLE with movment Pain Descriptors / Indicators: Sore Pain Intervention(s): Limited activity within patient's tolerance;Repositioned    Home Living                      Prior Function            PT Goals (current goals can now be found in the care plan section) Progress towards PT goals: Progressing toward goals  Frequency    Min 3X/week      PT Plan Current plan remains appropriate    Co-evaluation PT/OT/SLP Co-Evaluation/Treatment: Yes            AM-PAC PT "6 Clicks" Daily Activity  Outcome Measure  Difficulty turning over in bed (including adjusting bedclothes, sheets and blankets)?: A Lot Difficulty moving from lying on back to sitting on the side of the bed? : Unable Difficulty sitting down on and standing up from a chair with arms (e.g.,  wheelchair, bedside commode, etc,.)?: Unable Help needed moving to and from a bed to chair (including a wheelchair)?: A Little Help needed walking in hospital room?: A Little Help needed climbing 3-5 steps with a railing? : Total 6 Click Score: 11    End of Session Equipment Utilized During Treatment: Gait belt;Oxygen Activity Tolerance: Patient tolerated treatment well Patient left: in chair;with chair alarm set;with call bell/phone within reach;with family/visitor present Nurse Communication: Mobility status PT Visit Diagnosis: Muscle weakness (generalized) (M62.81);History of falling (Z91.81);Difficulty in walking, not elsewhere classified (R26.2);Pain Pain - Right/Left: Left Pain - part of body: Leg     Time: 7282-0601 PT Time Calculation (min) (ACUTE ONLY): 26 min  Charges:  $Gait Training: 23-37 mins                    G Codes:       [facility] PT [place] 09/18/2017, 10:06 AM

## 2017-09-18 NOTE — Progress Notes (Signed)
Patient will discharge to [facility] Anticipated discharge date: 9/21 Family notified: pt wife Dietitian by [employer]- scheduled for 1pm  CSW signing off.  Jorge Ny, LCSW Clinical Social Worker [phone]

## 2017-09-18 NOTE — Progress Notes (Signed)
[place] for warfarin and Lovenox  Indication: history of DVT/PE  Allergies  Allergen Reactions  . Ativan [Lorazepam] Anxiety and Other (See Comments)    Hyper  [name]    Patient Measurements: Height: 5\' 9"  (175.3 cm) Weight: 218 lb (98.9 kg) IBW/kg (Calculated) : 70.7  Vital Signs: Temp: 97.8 F (36.6 C) (09/21 0407) Temp Source: Axillary (09/21 0407) BP: 114/70 (09/21 0600) Pulse Rate: 88 (09/21 0600)  Labs:  Recent Labs  09/16/17 0342 09/17/17 0326 09/18/17 0244 09/18/17 0618  HGB 13.7  --  15.1  --   HCT 43.8  --  47.0  --   PLT 242  --  PLATELET CLUMPS NOTED ON SMEAR, UNABLE TO ESTIMATE  --   LABPROT 30.0* 30.9*  --  31.8*  INR 2.89 3.00  --  3.12  CREATININE 1.04  --  1.04  --     Estimated Creatinine Clearance: 77.8 mL/min (by C-G formula based on SCr of 1.04 mg/dL).   Assessment: 71 yo male on warfarin prior to arrival for history of PE and DVT on life long anticoagulation admitted 9/11 with left femur fracture. S/p nailing on 9/12. Warfarin resumed 9/14.   INR slightly high at 3.12  Cbc stable (plt clumped today)  Prior to arrival dose: 3 mg daily   Goal of Therapy:  INR 2-3 Monitor platelets by anticoagulation protocol: Yes   Plan:  Warfarin 2 mg x 1 Daily INR   Levester Fresh, PharmD, BCPS, BCCCP Clinical Pharmacist Clinical phone for 09/18/2017 from 7a-3:30p: [phone] If after 3:30p, please call main pharmacy at: [phone] 09/18/2017 7:51 AM

## 2017-09-18 NOTE — Discharge Summary (Signed)
Physician Discharge Summary  Travis Cruz HBZ:[id number] DOB: 12/11/1946 DOA: 09/07/2017  PCP: Timmothy Euler, MD  Admit date: 09/07/2017 Discharge date: 09/18/2017  Admitted From: home   Disposition:  SNF  Recommendations for Outpatient Follow-up:  1. [facility] in 1-2 weeks for acute urinary retention 2. Cardiology for follow up ischemic cardiomyopathy and heart failure requiring diuresis (Hochrein) in 2 weeks 3. Electrophysiology, Dr. Caryl Comes, in 2-4 weeks for tachycardia 4. Orthopedic surgery, Dr. Doreatha Martin, in 2 weeks for postop exam 5. WBAT LLE  Home Health:  Ongoing PT/OT  Equipment/Devices:  Oxygen 3L during day and bled into bipap machine at night.  Discharge Condition:  Stable, improved CODE STATUS:  Full code  Diet recommendation:  Healthy heart   Brief/Interim Summary:  71 yo male with history of CAD, V. fib arrest in 2014 s/p ICD placement, ischemic cardiomyopathy with systolic and diastolic heart failure, lung nodule which is malignant and prior pulmonary embolism, COPD, OSA on CPAP who was admitted on 09/07/17 with complex hip fracture at [facility] and transferred to [facility] for perioperative clearance.  He underwent IM nailing of a left intertrochanteric femur fracture on 9/12 by Dr. Doreatha Martin.  His postoperative course was complicated by volume overload, acute hypoxic respiratory failure initially necessitating BiPAP.  He was diuresed, given incentive spirometry, and encouraged to ambulate/sit in chair.  Gradually, he transitioned from HFNC to 3L [place].  On the date of discharge, he has been 24 hours on 3L [place], slept with his home bipap with 3L bled in, and ambulated with PT 90 feet maintaining O2 sat > 90%.  He also developed acute urinary retention and was given multiple attempts to void, however, ultimately, his foley catheter was replaced.  He has been started on flomax and will need to follow up with Urology in 1-2 weeks for catheter removal.  He is  ready for discharge to SNF.  Discharge Diagnoses:  Principal Problem:   Hip fracture requiring operative repair [facility]) Active Problems:   Deep vein thrombosis (DVT) (HCC)   HTN (hypertension)   NSTEMI (non-ST elevated myocardial infarction) ([place])   Ischemic cardiomyopathy   HLD (hyperlipidemia)   CAD (coronary atherosclerotic disease)   Obstructive sleep apnea   COPD (chronic obstructive pulmonary disease) (HCC)   History of DVT (deep vein thrombosis)   Hip fracture (HCC)   Closed left hip fracture, initial encounter (HCC)   Supratherapeutic INR   Acute diastolic CHF (congestive heart failure), NYHA class 3 (HCC)   Preoperative cardiovascular examination   Acute on chronic hypoxic respiratory failure (uses home oxygen 3 L bled into bipap at night, but no oxygen during day according to wife), likely due to to acute on chronic diastolic and systolic heart failure and atelectasis in the setting of known COPD/OSA.  He improved some with diuresis.  I think he has some chronic rales in the left base where there is some scarring evident on CXR.   -  start lasix 40mg  once daily pending cardiology follow up -  BMP in 1 week to check creatinine and potassium -  Encourage OOB and incentive spirometry -  Needs bipap at night [place]  Complex left hip proximal femur fracture status post intramedullary nailing left 9/12 -  Coumadin for DVT prophylaxis (lifelong coumadin) - Follow up with orthopedics in 2 weeks -  WBAT LLE  History of DVT and PE-also history of PFO 2012 and stroke - Coumadin lifelong, was on 3mg  daily prior to admission.  INR trended  up slightly so decreased to 2mg  on 9/21 and would recommend repeat INR in 2-3 days  Leukocytosis + thrombocytopenia, 14 mm NSCLC stage I status post XRT 02/2017 -  Thrombocytopenia resolved  Prior history V. fib arrest status post pacemaker 07/01/2013  CAD with single-vessel disease with tachycardia and NSVT 3-4 beats -  Continue metoprolol 37.5mg   BID -  Appreciate cardiology input -  resume atorvastatin 80mg  once daily once LFTs have returned to normal   2 GI bleeds in 2014 secondary to colonic polyp 06/30/2013-Dr. buccini -  Careful with anticoagulation, keep the low range of therapeutic  Seizure disorder, stable, continue keppra 500 2 tabs twice a day.  Left ankle pain Swelling in the left ankle is diminished and x-ray 9/12 was negative for acute fracture  Urinary retention, acute, foley replaced -  Continue flomax started during this admission -  F/u with Urology as outpatient  Discharge Instructions     Medication List    STOP taking these medications   atorvastatin 80 MG tablet Commonly known as:  LIPITOR   PRESERVISION AREDS 2 Caps     TAKE these medications   furosemide 40 MG tablet Commonly known as:  LASIX Take 1 tablet (40 mg total) by mouth daily.   levalbuterol 1.25 MG/0.5ML nebulizer solution Commonly known as:  XOPENEX Take 1.25 mg by nebulization every 4 (four) hours as needed for wheezing or shortness of breath.   levETIRAcetam 500 MG tablet Commonly known as:  KEPPRA TAKE 1 TABLET (500 MG TOTAL) BY MOUTH 2 TIMES DAILY.   Metoprolol Tartrate 37.5 MG Tabs Take 37.5 mg by mouth 2 (two) times daily.   multivitamin tablet Take 1 tablet by mouth daily.   SPIRIVA HANDIHALER 18 MCG inhalation capsule Generic drug:  tiotropium PLACE 1 CAPSULE INTO INHALER AND INHALE DAILY   tamsulosin 0.4 MG Caps capsule Commonly known as:  FLOMAX Take 1 capsule (0.4 mg total) by mouth daily.   warfarin 2 MG tablet Commonly known as:  COUMADIN Take 1 tablet (2 mg total) by mouth daily at 6 PM. What changed:  how much to take  how to take this  when to take this  additional instructions       Contact information for follow-up providers    Haddix, Thomasene Lot, MD. Schedule an appointment as soon as possible for a visit in 2 week(s).   Specialty:  Orthopedic Surgery Contact  information: [address] [place] [address] [phone]        Deboraha Sprang, MD. Schedule an appointment as soon as possible for a visit in 3 week(s).   Specialty:  Cardiology Contact information: [address] [place] [address] [phone]        Minus Breeding, MD. Schedule an appointment as soon as possible for a visit in 2 week(s).   Specialty:  Cardiology Contact information: [address] [place] [address] [place]. Schedule an appointment as soon as possible for a visit in 2 week(s).   Why:  foley catheter removal Contact information: [place] [phone]           Contact information for after-discharge care    Destination    [facility] Follow up.   Specialty:  [place] information: [place] [phone]  Allergies  Allergen Reactions  . Ativan [Lorazepam] Anxiety and Other (See Comments)    Hyper  [name]    Consultations:  Cardiology  Orthopedic surgery, Dr. Doreatha Martin    Procedures/Studies: Dg Knee 1-2 Views Left  Result Date: 09/07/2017 CLINICAL DATA:  Multiple recent falls with knee pain, initial encounter EXAM: LEFT KNEE - 2 VIEW COMPARISON:  09/19/2015 FINDINGS: Fixation sideplate is again identified along the distal femur and proximal tibia. The overall appearance is stable. Interval progressive healing in the distal femur is noted. No acute fracture or dislocation is seen. No joint effusion is noted. IMPRESSION: Chronic changes consistent with prior fracture and fixation. Some progressive healing is noted when compared with the prior study. No acute abnormality is noted. Electronically Signed   By: Inez Catalina M.D.   On: 09/07/2017 11:11   Dg Ankle 2 Views  Left  Result Date: 09/09/2017 CLINICAL DATA:  Left leg pain after fall. EXAM: LEFT ANKLE - 2 VIEW COMPARISON:  09/07/2017 FINDINGS: Generalized osteopenia of the left ankle with mild soft tissue edema. Intact ankle mortise and base of fifth metatarsal. Intact orthopedic hardware noted along the distal fibula with lateral plate and screw fixation. No appreciable joint effusion. Calcaneal enthesophytes are present. IMPRESSION: No acute appearing osseous abnormality. Intact orthopedic fixation hardware along the distal fibula. Electronically Signed   By: Ashley Royalty M.D.   On: 09/09/2017 21:04   Dg Ankle 2 Views Left  Result Date: 09/07/2017 CLINICAL DATA:  Left ankle pain secondary to multiple recent falls. Acute left hip fracture. EXAM: LEFT ANKLE - 2 VIEW COMPARISON:  None. FINDINGS: There is no acute fracture or dislocation. There is a healed fracture of the distal fibula with a sideplate and multiple screws in place in the fibula. There is diffuse osteopenia. No appreciable joint effusion. IMPRESSION: No appreciable acute abnormalities. Electronically Signed   By: Lorriane Shire M.D.   On: 09/07/2017 11:13   Ct Head Wo Contrast  Result Date: 09/07/2017 CLINICAL DATA:  Weakness, fall EXAM: CT HEAD WITHOUT CONTRAST CT CERVICAL SPINE WITHOUT CONTRAST TECHNIQUE: Multidetector CT imaging of the head and cervical spine was performed following the standard protocol without intravenous contrast. Multiplanar CT image reconstructions of the cervical spine were also generated. COMPARISON:  CT head dated 06/23/2013 FINDINGS: CT HEAD FINDINGS Brain: No evidence of acute infarction, hemorrhage, hydrocephalus, extra-axial collection or mass lesion/mass effect. Old right parietal and left parieto-occipital infarcts. Old left cerebellar lacunar infarct. Subcortical white matter and periventricular small vessel ischemic changes. Global cortical atrophy. Vascular: Intracranial atherosclerosis. Skull: Normal. Negative for  fracture or focal lesion. Sinuses/Orbits: The visualized paranasal sinuses are essentially clear. The mastoid air cells are unopacified. Other: None. CT CERVICAL SPINE FINDINGS Alignment: Reversal of the normal mid cervical lordosis. Skull base and vertebrae: No acute fracture. No primary bone lesion or focal pathologic process. Soft tissues and spinal canal: No prevertebral fluid or swelling. No visible canal hematoma. Disc levels:  Mild degenerative changes at C5-6. Spinal canal is patent. Upper chest: Visualized lung apices are notable for paraseptal emphysematous changes. Other: Visualized thyroid is unremarkable. IMPRESSION: No evidence of acute intracranial abnormality. Multiple old infarcts, as above. Atrophy with small vessel ischemic changes. No evidence of traumatic injury to the cervical spine. Mild degenerative changes. Electronically Signed   By: Julian Hy M.D.   On: 09/07/2017 11:21   Ct Cervical Spine Wo Contrast  Result Date: 09/07/2017 CLINICAL DATA:  Weakness, fall EXAM: CT HEAD  WITHOUT CONTRAST CT CERVICAL SPINE WITHOUT CONTRAST TECHNIQUE: Multidetector CT imaging of the head and cervical spine was performed following the standard protocol without intravenous contrast. Multiplanar CT image reconstructions of the cervical spine were also generated. COMPARISON:  CT head dated 06/23/2013 FINDINGS: CT HEAD FINDINGS Brain: No evidence of acute infarction, hemorrhage, hydrocephalus, extra-axial collection or mass lesion/mass effect. Old right parietal and left parieto-occipital infarcts. Old left cerebellar lacunar infarct. Subcortical white matter and periventricular small vessel ischemic changes. Global cortical atrophy. Vascular: Intracranial atherosclerosis. Skull: Normal. Negative for fracture or focal lesion. Sinuses/Orbits: The visualized paranasal sinuses are essentially clear. The mastoid air cells are unopacified. Other: None. CT CERVICAL SPINE FINDINGS Alignment: Reversal of the  normal mid cervical lordosis. Skull base and vertebrae: No acute fracture. No primary bone lesion or focal pathologic process. Soft tissues and spinal canal: No prevertebral fluid or swelling. No visible canal hematoma. Disc levels:  Mild degenerative changes at C5-6. Spinal canal is patent. Upper chest: Visualized lung apices are notable for paraseptal emphysematous changes. Other: Visualized thyroid is unremarkable. IMPRESSION: No evidence of acute intracranial abnormality. Multiple old infarcts, as above. Atrophy with small vessel ischemic changes. No evidence of traumatic injury to the cervical spine. Mild degenerative changes. Electronically Signed   By: Julian Hy M.D.   On: 09/07/2017 11:21   Dg Pelvis Portable  Result Date: 09/09/2017 CLINICAL DATA:  Post- operative left intramedullary nail fixation of a intertrochanteric fracture of the left hip. EXAM: PORTABLE PELVIS 1-2 VIEWS COMPARISON:  Fluoro spot radiographs of today's date FINDINGS: The bones are subjectively osteopenic. The pelvis exhibits no acute abnormality. The patient has undergone ORIF for an intertrochanteric fracture of the left hip. The visualized portions of the metallic hardware appeared normal. IMPRESSION: Limited views of the left hip. Status post ORIF for an intertrochanteric fracture. Electronically Signed   By: David  Martinique M.D.   On: 09/09/2017 15:59   Dg Chest Port 1 View  Result Date: 09/16/2017 CLINICAL DATA:  Shortness of Breath EXAM: PORTABLE CHEST 1 VIEW COMPARISON:  09/13/2017 FINDINGS: Right AICD remains in place, unchanged. Linear atelectasis in the left upper lobe and in the bases with low lung volumes. No effusions. Heart is borderline in size. IMPRESSION: Areas of bibasilar and left upper lobe atelectasis. Electronically Signed   By: Rolm Baptise M.D.   On: 09/16/2017 09:43   Dg Chest Port 1 View  Result Date: 09/10/2017 CLINICAL DATA:  Pneumonia.  Left hip replacement 2 days ago EXAM: PORTABLE CHEST  1 VIEW COMPARISON:  Three days ago FINDINGS: Low volume chest with streaky density attributed atelectasis and left perihilar scarring. Patient has history of treated lung cancer. Cardiomegaly with single chamber pacer/ICD -left-sided venotomy and right-sided battery pack. No effusion or pneumothorax is seen. No air bronchogram. Left lateral rib fracture is, also seen on May 2018 chest CT. IMPRESSION: 1. Low volume chest with atelectasis and scarring. 2. Emphysema by recent chest CT. 3. No convincing edema or focal pneumonia. Electronically Signed   By: Monte Fantasia M.D.   On: 09/10/2017 13:46   Dg Chest Portable 1 View  Result Date: 09/07/2017 CLINICAL DATA:  Weakness and low O2 sats. He fell transferring from wheelchair to commode this morning. EXAM: PORTABLE CHEST 1 VIEW COMPARISON:  02/17/2017 FINDINGS: Heart is enlarged. Patient has right-sided AICD with leads to the right ventricle, stable in appearance. There is left mid lung zone atelectasis or scarring, stable in appearance. There is mild bibasilar atelectasis. Mildly prominent interstitial markings are  suspicious for mild interstitial edema. No overt alveolar edema. No consolidations or effusions. IMPRESSION: 1. Cardiomegaly and probable mild interstitial edema. 2. Left mid lung zone scarring and bibasilar atelectasis. Electronically Signed   By: Nolon Nations M.D.   On: 09/07/2017 09:58   Dg Chest Port 1v Same Day  Result Date: 09/13/2017 CLINICAL DATA:  Pneumonia EXAM: PORTABLE CHEST 1 VIEW COMPARISON:  09/10/2017 FINDINGS: AICD remains in place. Low lung volumes are present, causing crowding of the pulmonary vasculature. Indistinct airspace opacity at the left lung base. Partial clearing of the bandlike opacity in the left mid lung. Mild improvement in aeration at the right lung base. Peripheral lucency in the lungs compatible with paraseptal emphysema. Mild enlargement of the cardiopericardial silhouette. IMPRESSION: 1. Mildly improved  aeration at the right lung base. Continued left basilar airspace opacity, potentially from atelectasis or pneumonia. 2. Low lung volumes. 3. Mild enlargement of the cardiopericardial silhouette. AICD noted. 4.  Emphysema (ICD10-J43.9). Electronically Signed   By: Van Clines M.D.   On: 09/13/2017 12:14   Dg Knee Complete 4 Views Right  Result Date: 09/07/2017 CLINICAL DATA:  Multiple falls EXAM: RIGHT KNEE - COMPLETE 4+ VIEW COMPARISON:  None. FINDINGS: Frontal, lateral, and bilateral oblique views were obtained. There are screws transfixing the proximal tibial metaphysis. There is no appreciable tibial plateau depression. No acute fracture or dislocation. No appreciable joint effusion. Joint spaces appear unremarkable. There is slight chondrocalcinosis medially. There is a benign exostosis arising from the distal medial femoral metaphysis measuring 2.1 x 1.2 cm. There is popliteal artery atherosclerosis. IMPRESSION: Postoperative change proximal tibia. No tibial plateau depression. No acute fracture or dislocation. No joint effusion. No appreciable joint space narrowing. Benign-appearing exostosis along the medial distal femoral metaphysis. Mild chondrocalcinosis medially, a finding that may be seen with osteoarthritis or calcium pyrophosphate deposition disease. Popliteal artery atherosclerosis. Electronically Signed   By: Lowella Grip III M.D.   On: 09/07/2017 11:12   Dg Hand Complete Right  Result Date: 09/07/2017 CLINICAL DATA:  Hand pain and swelling and bruising secondary to multiple recent falls. Acute left hip fracture. EXAM: RIGHT HAND - COMPLETE 3+ VIEW COMPARISON:  None. FINDINGS: There is no evidence of fracture or dislocation. There is osteoarthritis primarily of the IP joints of the digits. Old deformity of the fifth metacarpal consistent with a remote healed fracture. Soft tissues are unremarkable. IMPRESSION: No acute abnormalities. Electronically Signed   By: Lorriane Shire M.D.    On: 09/07/2017 11:15   Dg Foot 2 Views Left  Result Date: 09/07/2017 CLINICAL DATA:  71 year old male with a history of multiple falls and foot pain EXAM: LEFT FOOT - 2 VIEW COMPARISON:  None. FINDINGS: Advanced osteopenia. No displaced fracture identified. Note that portions of the calcaneus have been excluded on the lateral view. Degenerative changes of the hindfoot, midfoot, forefoot. No focal soft tissue swelling. No radiopaque foreign body. Partially imaged surgical changes of the fibula. IMPRESSION: No acute bony abnormality identified, however, advanced osteopenia somewhat limits detection of nondisplaced fractures. Degenerative changes of the hindfoot, midfoot, forefoot. Electronically Signed   By: Corrie Mckusick D.O.   On: 09/07/2017 11:12   Dg Foot Complete Left  Result Date: 09/12/2017 CLINICAL DATA:  Pain EXAM: LEFT FOOT - COMPLETE 3+ VIEW COMPARISON:  September 07, 2017 FINDINGS: Frontal, oblique, and lateral views were obtained. Bones are diffusely osteoporotic. There is postoperative change in the distal fibula. No acute fracture or dislocation is evident. There is no appreciable joint space narrowing  or erosion. There is soft tissue swelling along the dorsum of the foot in the midportion. There is a small benign exostosis arising from the dorsal distal talus. IMPRESSION: Soft tissue swelling dorsally. Severe osteoporosis. No acute fracture or dislocation. No erosive change or bony destruction. Postoperative change distal fibula. Small benign exostosis dorsal distal talus. Electronically Signed   By: Lowella Grip III M.D.   On: 09/12/2017 01:58   Dg C-arm 1-60 Min  Result Date: 09/09/2017 CLINICAL DATA:  71 year old male with left intertrochanteric femur fracture undergoing ORIF. EXAM: DG C-ARM 61-120 MIN; LEFT FEMUR 2 VIEWS COMPARISON:  Left femur series 09/08/17 FINDINGS: Seven intraoperative fluoroscopic spot views of the left femur demonstrate placement of a proximal left femoral  intramedullary rod with distal interlocking cortical screw and proximal interlocking dynamic hip screw. Fracture alignment appears near anatomic on the later images. Partially visible preexisting distal left femur plate and screw fixation. FLUOROSCOPY TIME:  1 minutes 51 seconds IMPRESSION: ORIF proximal left femur with no adverse features identified. Electronically Signed   By: Genevie Ann M.D.   On: 09/09/2017 14:26   Dg Hip Unilat With Pelvis 2-3 Views Left  Result Date: 09/07/2017 CLINICAL DATA:  Fall on Saturday and again today. Severe left hip pain. EXAM: DG HIP (WITH OR WITHOUT PELVIS) 2-3V LEFT COMPARISON:  None. FINDINGS: Osteopenia. There is a a mildly comminuted and slightly displaced fracture of the intertrochanteric left femur. Resultant varus angulation. No additional evidence of an acute fracture. Degenerative changes in the spine. IMPRESSION: Left intertrochanteric femur fracture. Electronically Signed   By: Lorin Picket M.D.   On: 09/07/2017 11:11   Dg Femur Min 2 Views Left  Result Date: 09/09/2017 CLINICAL DATA:  71 year old male with left intertrochanteric femur fracture undergoing ORIF. EXAM: DG C-ARM 61-120 MIN; LEFT FEMUR 2 VIEWS COMPARISON:  Left femur series 09/08/17 FINDINGS: Seven intraoperative fluoroscopic spot views of the left femur demonstrate placement of a proximal left femoral intramedullary rod with distal interlocking cortical screw and proximal interlocking dynamic hip screw. Fracture alignment appears near anatomic on the later images. Partially visible preexisting distal left femur plate and screw fixation. FLUOROSCOPY TIME:  1 minutes 51 seconds IMPRESSION: ORIF proximal left femur with no adverse features identified. Electronically Signed   By: Genevie Ann M.D.   On: 09/09/2017 14:26   Dg Femur Port Min 2 Views Left  Result Date: 09/09/2017 CLINICAL DATA:  Post- operative left intramedullary nail for an intertrochanteric fracture. EXAM: LEFT FEMUR PORTABLE 2 VIEWS  COMPARISON:  Fluoro spot views of today's date FINDINGS: The patient has undergone ORIF for an entered trochanteric fracture of the left hip. The into medullary rod and telescoping screw appear appropriately position. The patient is has undergone previous plate and screw fixation of fractures of the distal femur and proximal tibia. IMPRESSION: No immediate postprocedure complication following ORIF for an intertrochanteric fracture of the left hip. Electronically Signed   By: David  Martinique M.D.   On: 09/09/2017 16:48   Dg Femur Port Min 2 Views Left  Result Date: 09/08/2017 CLINICAL DATA:  Hip fracture. EXAM: LEFT FEMUR PORTABLE 2 VIEWS COMPARISON:  09/07/2017. FINDINGS: Left intertrochanteric hip fractures again noted. Mild angulation deformity noted. Similar findings noted on prior exam. Plate and screw fixation of the distal femur and proximal tibia noted. Stable deformity of the distal femur. Stable soft tissue ossification. IMPRESSION: 1. Right intertrochanteric hip fracture, similar findings noted on prior study of 09/07/2017. 2. Stable appearing postsurgical changes distal femur and proximal  tibia P Electronically Signed   By: Marcello Moores  Register   On: 09/08/2017 10:58   Subjective: Decreasing pain in the left leg.  Denies worsening breathing.  Denies vomiting, diarrhea.  Still has foley catheter in place  Discharge Exam: Vitals:   09/18/17 0832 09/18/17 0833  BP:  113/75  Pulse:  92  Resp:  19  Temp:  98.4 F (36.9 C)  SpO2: 90% (!) 87%   Vitals:   09/18/17 0600 09/18/17 0700 09/18/17 0832 09/18/17 0833  BP: 114/70 107/71  113/75  Pulse: 88 87  92  Resp: 16 18  19   Temp:    98.4 F (36.9 C)  TempSrc:    Axillary  SpO2: 93% 91% 90% (!) 87%  Weight:      Height:        General: Pt is alert, awake, not in acute distress, 3L  Cardiovascular: RRR, S1/S2 +, no rubs, no gallops Respiratory: persistent stable rales at the left base, no wheezes or rhonchi Abdominal: Soft, NT, ND,  bowel sounds + Extremities: 1+ pitting edema of the left lower extremity. Bruising and swelling of the left foot.  Hip area is indurated.  Dressing c/d/i.      The results of significant diagnostics from this hospitalization (including imaging, microbiology, ancillary and laboratory) are listed below for reference.     Microbiology: No results found for this or any previous visit (from the past 240 hour(s)).   Labs: BNP (last 3 results)  Recent Labs  09/07/17 0919  BNP 30.1   Basic Metabolic Panel:  Recent Labs Lab 09/13/17 0808 09/14/17 2315 09/16/17 0342 09/18/17 0244  NA 137  --  139 138  K 4.0  --  3.8 4.1  CL 101  --  100* 98*  CO2 29  --  33* 30  GLUCOSE 123*  --  120* 122*  BUN 20  --  25* 33*  CREATININE 0.94  --  1.04 1.04  CALCIUM 8.6*  --  9.4 9.7  MG  --  2.0  --   --   PHOS 2.5  --   --   --    Liver Function Tests:  Recent Labs Lab 09/13/17 0808 09/16/17 0342  AST  --  55*  ALT  --  74*  ALKPHOS  --  125  BILITOT  --  1.2  PROT  --  6.8  ALBUMIN 2.4* 2.6*   No results for input(s): LIPASE, AMYLASE in the last 168 hours. No results for input(s): AMMONIA in the last 168 hours. CBC:  Recent Labs Lab 09/13/17 0808 09/14/17 1613 09/16/17 0342 09/18/17 0244  WBC 13.3* 17.0* 15.5* 15.6*  NEUTROABS  --  12.8*  --   --   HGB 12.4* 13.2 13.7 15.1  HCT 39.8 43.8 43.8 47.0  MCV 91.3 89.6 89.0 89.4  PLT 80* PLATELET CLUMPS NOTED ON SMEAR, UNABLE TO ESTIMATE 242 PLATELET CLUMPS NOTED ON SMEAR, UNABLE TO ESTIMATE   Cardiac Enzymes: No results for input(s): CKTOTAL, CKMB, CKMBINDEX, TROPONINI in the last 168 hours. BNP: Invalid input(s): POCBNP CBG: No results for input(s): GLUCAP in the last 168 hours. D-Dimer No results for input(s): DDIMER in the last 72 hours. Hgb A1c No results for input(s): HGBA1C in the last 72 hours. Lipid Profile No results for input(s): CHOL, HDL, LDLCALC, TRIG, CHOLHDL, LDLDIRECT in the last 72 hours. Thyroid  function studies No results for input(s): TSH, T4TOTAL, T3FREE, THYROIDAB in the last 72 hours.  Invalid input(s): FREET3  Anemia work up No results for input(s): VITAMINB12, FOLATE, FERRITIN, TIBC, IRON, RETICCTPCT in the last 72 hours. Urinalysis    Component Value Date/Time   COLORURINE AMBER (A) 09/07/2017 0953   APPEARANCEUR CLOUDY (A) 09/07/2017 0953   APPEARANCEUR Clear 04/24/2016 0811   LABSPEC 1.025 09/07/2017 0953   PHURINE 5.0 09/07/2017 0953   GLUCOSEU NEGATIVE 09/07/2017 0953   HGBUR NEGATIVE 09/07/2017 0953   BILIRUBINUR NEGATIVE 09/07/2017 0953   BILIRUBINUR Negative 04/24/2016 0811   KETONESUR 5 (A) 09/07/2017 0953   PROTEINUR 100 (A) 09/07/2017 0953   UROBILINOGEN 1.0 01/31/2013 1035   NITRITE NEGATIVE 09/07/2017 0953   LEUKOCYTESUR NEGATIVE 09/07/2017 0953   LEUKOCYTESUR Negative 04/24/2016 0811   Sepsis Labs Invalid input(s): PROCALCITONIN,  WBC,  LACTICIDVEN   Time coordinating discharge: Over 30 minutes  SIGNED:   Janece Canterbury, MD  [facility] 09/18/2017, 11:03 AM Pager   If 7PM-7AM, please contact night-coverage [email or web address] Password [email or web address]

## 2017-09-21 DIAGNOSIS — E119 Type 2 diabetes mellitus without complications: Secondary | ICD-10-CM | POA: Diagnosis not present

## 2017-09-21 DIAGNOSIS — Z79899 Other long term (current) drug therapy: Secondary | ICD-10-CM | POA: Diagnosis not present

## 2017-09-21 DIAGNOSIS — E7800 Pure hypercholesterolemia, unspecified: Secondary | ICD-10-CM | POA: Diagnosis not present

## 2017-09-21 DIAGNOSIS — D649 Anemia, unspecified: Secondary | ICD-10-CM | POA: Diagnosis not present

## 2017-09-21 DIAGNOSIS — D518 Other vitamin B12 deficiency anemias: Secondary | ICD-10-CM | POA: Diagnosis not present

## 2017-09-21 DIAGNOSIS — E039 Hypothyroidism, unspecified: Secondary | ICD-10-CM | POA: Diagnosis not present

## 2017-09-21 DIAGNOSIS — D689 Coagulation defect, unspecified: Secondary | ICD-10-CM | POA: Diagnosis not present

## 2017-09-22 DIAGNOSIS — M6281 Muscle weakness (generalized): Secondary | ICD-10-CM | POA: Diagnosis not present

## 2017-09-22 DIAGNOSIS — J449 Chronic obstructive pulmonary disease, unspecified: Secondary | ICD-10-CM | POA: Diagnosis not present

## 2017-09-22 DIAGNOSIS — S72142D Displaced intertrochanteric fracture of left femur, subsequent encounter for closed fracture with routine healing: Secondary | ICD-10-CM | POA: Diagnosis not present

## 2017-09-22 DIAGNOSIS — I82409 Acute embolism and thrombosis of unspecified deep veins of unspecified lower extremity: Secondary | ICD-10-CM | POA: Diagnosis not present

## 2017-09-24 ENCOUNTER — Ambulatory Visit (INDEPENDENT_AMBULATORY_CARE_PROVIDER_SITE_OTHER): Payer: BLUE CROSS/BLUE SHIELD | Admitting: Ophthalmology

## 2017-09-28 DIAGNOSIS — L02222 Furuncle of back [any part, except buttock]: Secondary | ICD-10-CM | POA: Diagnosis not present

## 2017-09-28 DIAGNOSIS — I5031 Acute diastolic (congestive) heart failure: Secondary | ICD-10-CM | POA: Diagnosis not present

## 2017-09-28 DIAGNOSIS — G4733 Obstructive sleep apnea (adult) (pediatric): Secondary | ICD-10-CM | POA: Diagnosis not present

## 2017-09-28 DIAGNOSIS — J189 Pneumonia, unspecified organism: Secondary | ICD-10-CM | POA: Diagnosis not present

## 2017-09-28 DIAGNOSIS — J449 Chronic obstructive pulmonary disease, unspecified: Secondary | ICD-10-CM | POA: Diagnosis not present

## 2017-09-28 DIAGNOSIS — I255 Ischemic cardiomyopathy: Secondary | ICD-10-CM | POA: Diagnosis not present

## 2017-09-28 DIAGNOSIS — R339 Retention of urine, unspecified: Secondary | ICD-10-CM | POA: Diagnosis not present

## 2017-09-28 DIAGNOSIS — I82409 Acute embolism and thrombosis of unspecified deep veins of unspecified lower extremity: Secondary | ICD-10-CM | POA: Diagnosis not present

## 2017-09-28 DIAGNOSIS — M6281 Muscle weakness (generalized): Secondary | ICD-10-CM | POA: Diagnosis not present

## 2017-09-28 DIAGNOSIS — E785 Hyperlipidemia, unspecified: Secondary | ICD-10-CM | POA: Diagnosis not present

## 2017-09-28 DIAGNOSIS — R41841 Cognitive communication deficit: Secondary | ICD-10-CM | POA: Diagnosis not present

## 2017-09-28 DIAGNOSIS — I10 Essential (primary) hypertension: Secondary | ICD-10-CM | POA: Diagnosis not present

## 2017-09-28 DIAGNOSIS — I2510 Atherosclerotic heart disease of native coronary artery without angina pectoris: Secondary | ICD-10-CM | POA: Diagnosis not present

## 2017-09-28 DIAGNOSIS — I214 Non-ST elevation (NSTEMI) myocardial infarction: Secondary | ICD-10-CM | POA: Diagnosis not present

## 2017-09-28 DIAGNOSIS — S72142D Displaced intertrochanteric fracture of left femur, subsequent encounter for closed fracture with routine healing: Secondary | ICD-10-CM | POA: Diagnosis not present

## 2017-09-28 DIAGNOSIS — G4700 Insomnia, unspecified: Secondary | ICD-10-CM | POA: Diagnosis not present

## 2017-09-29 DIAGNOSIS — J449 Chronic obstructive pulmonary disease, unspecified: Secondary | ICD-10-CM | POA: Diagnosis not present

## 2017-09-29 DIAGNOSIS — S72142D Displaced intertrochanteric fracture of left femur, subsequent encounter for closed fracture with routine healing: Secondary | ICD-10-CM | POA: Diagnosis not present

## 2017-09-29 DIAGNOSIS — M6281 Muscle weakness (generalized): Secondary | ICD-10-CM | POA: Diagnosis not present

## 2017-09-29 DIAGNOSIS — R197 Diarrhea, unspecified: Secondary | ICD-10-CM | POA: Diagnosis not present

## 2017-09-30 DIAGNOSIS — R339 Retention of urine, unspecified: Secondary | ICD-10-CM | POA: Diagnosis not present

## 2017-10-01 ENCOUNTER — Ambulatory Visit (INDEPENDENT_AMBULATORY_CARE_PROVIDER_SITE_OTHER): Payer: BLUE CROSS/BLUE SHIELD | Admitting: *Deleted

## 2017-10-01 DIAGNOSIS — I255 Ischemic cardiomyopathy: Secondary | ICD-10-CM

## 2017-10-01 NOTE — Progress Notes (Signed)
Remote ICD transmission.   

## 2017-10-05 DIAGNOSIS — Z79899 Other long term (current) drug therapy: Secondary | ICD-10-CM | POA: Diagnosis not present

## 2017-10-05 DIAGNOSIS — D649 Anemia, unspecified: Secondary | ICD-10-CM | POA: Diagnosis not present

## 2017-10-06 DIAGNOSIS — R197 Diarrhea, unspecified: Secondary | ICD-10-CM | POA: Diagnosis not present

## 2017-10-06 DIAGNOSIS — S72142D Displaced intertrochanteric fracture of left femur, subsequent encounter for closed fracture with routine healing: Secondary | ICD-10-CM | POA: Diagnosis not present

## 2017-10-06 DIAGNOSIS — D72829 Elevated white blood cell count, unspecified: Secondary | ICD-10-CM | POA: Diagnosis not present

## 2017-10-06 DIAGNOSIS — L989 Disorder of the skin and subcutaneous tissue, unspecified: Secondary | ICD-10-CM | POA: Diagnosis not present

## 2017-10-06 DIAGNOSIS — L02212 Cutaneous abscess of back [any part, except buttock]: Secondary | ICD-10-CM | POA: Diagnosis not present

## 2017-10-07 LAB — CUP PACEART REMOTE DEVICE CHECK
Battery Remaining Longevity: 58 mo
Battery Remaining Percentage: 59 %
Battery Voltage: 2.95 V
Brady Statistic RV Percent Paced: 0 %
Date Time Interrogation Session: 20181004060016
HighPow Impedance: 105 Ohm
HighPow Impedance: 105 Ohm
Implantable Lead Implant Date: 20140205
Implantable Lead Location: 753860
Implantable Lead Model: 181
Implantable Lead Serial Number: 323859
Implantable Pulse Generator Implant Date: 20140205
Lead Channel Impedance Value: 450 Ohm
Lead Channel Pacing Threshold Amplitude: 1 V
Lead Channel Pacing Threshold Pulse Width: 0.5 ms
Lead Channel Sensing Intrinsic Amplitude: 11.8 mV
Lead Channel Setting Pacing Amplitude: 2.5 V
Lead Channel Setting Pacing Pulse Width: 0.5 ms
Lead Channel Setting Sensing Sensitivity: 0.5 mV
Pulse Gen Serial Number: 7040910

## 2017-10-08 ENCOUNTER — Encounter: Payer: Self-pay | Admitting: Cardiology
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
## 2017-10-12 ENCOUNTER — Ambulatory Visit (INDEPENDENT_AMBULATORY_CARE_PROVIDER_SITE_OTHER): Payer: BLUE CROSS/BLUE SHIELD | Admitting: Physician Assistant

## 2017-10-12 VITALS — BP 110/80 | HR 96 | Ht 69.0 in | Wt 210.0 lb

## 2017-10-12 DIAGNOSIS — I255 Ischemic cardiomyopathy: Secondary | ICD-10-CM | POA: Diagnosis not present

## 2017-10-12 DIAGNOSIS — Z95810 Presence of automatic (implantable) cardiac defibrillator: Secondary | ICD-10-CM

## 2017-10-12 DIAGNOSIS — I10 Essential (primary) hypertension: Secondary | ICD-10-CM | POA: Diagnosis not present

## 2017-10-12 DIAGNOSIS — I2510 Atherosclerotic heart disease of native coronary artery without angina pectoris: Secondary | ICD-10-CM | POA: Diagnosis not present

## 2017-10-12 DIAGNOSIS — I5031 Acute diastolic (congestive) heart failure: Secondary | ICD-10-CM | POA: Diagnosis not present

## 2017-10-12 DIAGNOSIS — I471 Supraventricular tachycardia: Secondary | ICD-10-CM | POA: Diagnosis not present

## 2017-10-12 NOTE — Patient Instructions (Addendum)
Medication Instructions:   Your physician recommends that you continue on your current medications as directed. Please refer to the Current Medication list given to you today.   If you need a refill on your cardiac medications before your next appointment, please call your pharmacy.  Labwork: NONE ORDERED  TODAY    Testing/Procedures: NONE ORDERED  TODAY    Follow-Up: Your physician wants you to follow-up in:  IN  6  MONTHS WITH DR Caryl Comes / Charlcie Cradle  You will receive a reminder letter in the mail two months in advance. If you don't receive a letter, please call our office to schedule the follow-up appointment.  Remote monitoring is used to monitor your Pacemaker of ICD from home. This monitoring reduces the number of office visits required to check your device to one time per year. It allows [place] to keep an eye on the functioning of your device to ensure it is working properly. You are scheduled for a device check from home on .Marland KitchenMarland Kitchen1-02-2018 You may send your transmission at any time that day. If you have a wireless device, the transmission will be sent automatically. After your physician reviews your transmission, you will receive a postcard with your next transmission date.      Any Other Special Instructions Will Be Listed Below (If Applicable).

## 2017-10-13 DIAGNOSIS — L989 Disorder of the skin and subcutaneous tissue, unspecified: Secondary | ICD-10-CM | POA: Diagnosis not present

## 2017-10-13 DIAGNOSIS — D72829 Elevated white blood cell count, unspecified: Secondary | ICD-10-CM | POA: Diagnosis not present

## 2017-10-13 DIAGNOSIS — J189 Pneumonia, unspecified organism: Secondary | ICD-10-CM | POA: Diagnosis not present

## 2017-10-13 DIAGNOSIS — L02212 Cutaneous abscess of back [any part, except buttock]: Secondary | ICD-10-CM | POA: Diagnosis not present

## 2017-10-14 DIAGNOSIS — C44319 Basal cell carcinoma of skin of other parts of face: Secondary | ICD-10-CM | POA: Diagnosis not present

## 2017-10-15 ENCOUNTER — Other Ambulatory Visit: Payer: Self-pay | Admitting: Family Medicine

## 2017-10-15 ENCOUNTER — Ambulatory Visit: Payer: Self-pay | Admitting: Family Medicine

## 2017-10-15 DIAGNOSIS — Z79899 Other long term (current) drug therapy: Secondary | ICD-10-CM | POA: Diagnosis not present

## 2017-10-15 DIAGNOSIS — D649 Anemia, unspecified: Secondary | ICD-10-CM | POA: Diagnosis not present

## 2017-10-16 NOTE — Telephone Encounter (Signed)
Last seen 04/14/17  Dr Wendi Snipes

## 2017-10-20 DIAGNOSIS — J189 Pneumonia, unspecified organism: Secondary | ICD-10-CM | POA: Diagnosis not present

## 2017-10-20 DIAGNOSIS — L989 Disorder of the skin and subcutaneous tissue, unspecified: Secondary | ICD-10-CM | POA: Diagnosis not present

## 2017-10-20 DIAGNOSIS — L02212 Cutaneous abscess of back [any part, except buttock]: Secondary | ICD-10-CM | POA: Diagnosis not present

## 2017-10-20 DIAGNOSIS — R197 Diarrhea, unspecified: Secondary | ICD-10-CM | POA: Diagnosis not present

## 2017-10-21 ENCOUNTER — Ambulatory Visit (INDEPENDENT_AMBULATORY_CARE_PROVIDER_SITE_OTHER): Payer: BLUE CROSS/BLUE SHIELD | Admitting: Urology

## 2017-10-21 DIAGNOSIS — R339 Retention of urine, unspecified: Secondary | ICD-10-CM

## 2017-10-21 DIAGNOSIS — N401 Enlarged prostate with lower urinary tract symptoms: Secondary | ICD-10-CM | POA: Diagnosis not present

## 2017-10-27 DIAGNOSIS — J449 Chronic obstructive pulmonary disease, unspecified: Secondary | ICD-10-CM | POA: Diagnosis not present

## 2017-10-27 DIAGNOSIS — G309 Alzheimer's disease, unspecified: Secondary | ICD-10-CM | POA: Diagnosis not present

## 2017-10-27 DIAGNOSIS — J189 Pneumonia, unspecified organism: Secondary | ICD-10-CM | POA: Diagnosis not present

## 2017-10-27 DIAGNOSIS — R093 Abnormal sputum: Secondary | ICD-10-CM | POA: Diagnosis not present

## 2017-10-27 DIAGNOSIS — R1310 Dysphagia, unspecified: Secondary | ICD-10-CM | POA: Diagnosis not present

## 2017-10-27 DIAGNOSIS — I509 Heart failure, unspecified: Secondary | ICD-10-CM | POA: Diagnosis not present

## 2017-10-27 DIAGNOSIS — L989 Disorder of the skin and subcutaneous tissue, unspecified: Secondary | ICD-10-CM | POA: Diagnosis not present

## 2017-10-27 DIAGNOSIS — M6281 Muscle weakness (generalized): Secondary | ICD-10-CM | POA: Diagnosis not present

## 2017-10-29 DIAGNOSIS — G4700 Insomnia, unspecified: Secondary | ICD-10-CM | POA: Diagnosis not present

## 2017-10-29 DIAGNOSIS — I82409 Acute embolism and thrombosis of unspecified deep veins of unspecified lower extremity: Secondary | ICD-10-CM | POA: Diagnosis not present

## 2017-10-29 DIAGNOSIS — I214 Non-ST elevation (NSTEMI) myocardial infarction: Secondary | ICD-10-CM | POA: Diagnosis not present

## 2017-10-29 DIAGNOSIS — R339 Retention of urine, unspecified: Secondary | ICD-10-CM | POA: Diagnosis not present

## 2017-10-29 DIAGNOSIS — E785 Hyperlipidemia, unspecified: Secondary | ICD-10-CM | POA: Diagnosis not present

## 2017-10-29 DIAGNOSIS — J449 Chronic obstructive pulmonary disease, unspecified: Secondary | ICD-10-CM | POA: Diagnosis not present

## 2017-10-29 DIAGNOSIS — J189 Pneumonia, unspecified organism: Secondary | ICD-10-CM | POA: Diagnosis not present

## 2017-10-29 DIAGNOSIS — I2510 Atherosclerotic heart disease of native coronary artery without angina pectoris: Secondary | ICD-10-CM | POA: Diagnosis not present

## 2017-10-29 DIAGNOSIS — I255 Ischemic cardiomyopathy: Secondary | ICD-10-CM | POA: Diagnosis not present

## 2017-10-29 DIAGNOSIS — R41841 Cognitive communication deficit: Secondary | ICD-10-CM | POA: Diagnosis not present

## 2017-10-29 DIAGNOSIS — M6281 Muscle weakness (generalized): Secondary | ICD-10-CM | POA: Diagnosis not present

## 2017-10-29 DIAGNOSIS — G4733 Obstructive sleep apnea (adult) (pediatric): Secondary | ICD-10-CM | POA: Diagnosis not present

## 2017-10-29 DIAGNOSIS — I5031 Acute diastolic (congestive) heart failure: Secondary | ICD-10-CM | POA: Diagnosis not present

## 2017-10-29 DIAGNOSIS — S72142D Displaced intertrochanteric fracture of left femur, subsequent encounter for closed fracture with routine healing: Secondary | ICD-10-CM | POA: Diagnosis not present

## 2017-10-29 DIAGNOSIS — L02222 Furuncle of back [any part, except buttock]: Secondary | ICD-10-CM | POA: Diagnosis not present

## 2017-10-29 DIAGNOSIS — I10 Essential (primary) hypertension: Secondary | ICD-10-CM | POA: Diagnosis not present

## 2017-11-01 DIAGNOSIS — M6281 Muscle weakness (generalized): Secondary | ICD-10-CM | POA: Diagnosis not present

## 2017-11-01 DIAGNOSIS — L02222 Furuncle of back [any part, except buttock]: Secondary | ICD-10-CM | POA: Diagnosis not present

## 2017-11-01 DIAGNOSIS — I5031 Acute diastolic (congestive) heart failure: Secondary | ICD-10-CM | POA: Diagnosis not present

## 2017-11-01 DIAGNOSIS — I2510 Atherosclerotic heart disease of native coronary artery without angina pectoris: Secondary | ICD-10-CM | POA: Diagnosis not present

## 2017-11-01 DIAGNOSIS — S72142D Displaced intertrochanteric fracture of left femur, subsequent encounter for closed fracture with routine healing: Secondary | ICD-10-CM | POA: Diagnosis not present

## 2017-11-01 DIAGNOSIS — G4700 Insomnia, unspecified: Secondary | ICD-10-CM | POA: Diagnosis not present

## 2017-11-01 DIAGNOSIS — C44310 Basal cell carcinoma of skin of unspecified parts of face: Secondary | ICD-10-CM | POA: Diagnosis not present

## 2017-11-01 DIAGNOSIS — J449 Chronic obstructive pulmonary disease, unspecified: Secondary | ICD-10-CM | POA: Diagnosis not present

## 2017-11-01 DIAGNOSIS — I82409 Acute embolism and thrombosis of unspecified deep veins of unspecified lower extremity: Secondary | ICD-10-CM | POA: Diagnosis not present

## 2017-11-01 DIAGNOSIS — I110 Hypertensive heart disease with heart failure: Secondary | ICD-10-CM | POA: Diagnosis not present

## 2017-11-01 DIAGNOSIS — G4733 Obstructive sleep apnea (adult) (pediatric): Secondary | ICD-10-CM | POA: Diagnosis not present

## 2017-11-01 DIAGNOSIS — I252 Old myocardial infarction: Secondary | ICD-10-CM | POA: Diagnosis not present

## 2017-11-01 DIAGNOSIS — E785 Hyperlipidemia, unspecified: Secondary | ICD-10-CM | POA: Diagnosis not present

## 2017-11-02 ENCOUNTER — Encounter: Payer: Self-pay | Admitting: Family Medicine

## 2017-11-02 ENCOUNTER — Ambulatory Visit (INDEPENDENT_AMBULATORY_CARE_PROVIDER_SITE_OTHER): Payer: BLUE CROSS/BLUE SHIELD | Admitting: Family Medicine

## 2017-11-02 VITALS — BP 117/72 | HR 88 | Temp 98.2°F | Ht 69.0 in | Wt 213.0 lb

## 2017-11-02 DIAGNOSIS — L723 Sebaceous cyst: Secondary | ICD-10-CM

## 2017-11-02 DIAGNOSIS — E785 Hyperlipidemia, unspecified: Secondary | ICD-10-CM | POA: Diagnosis not present

## 2017-11-02 DIAGNOSIS — I82409 Acute embolism and thrombosis of unspecified deep veins of unspecified lower extremity: Secondary | ICD-10-CM | POA: Diagnosis not present

## 2017-11-02 DIAGNOSIS — J449 Chronic obstructive pulmonary disease, unspecified: Secondary | ICD-10-CM | POA: Diagnosis not present

## 2017-11-02 DIAGNOSIS — L02222 Furuncle of back [any part, except buttock]: Secondary | ICD-10-CM | POA: Diagnosis not present

## 2017-11-02 DIAGNOSIS — C44310 Basal cell carcinoma of skin of unspecified parts of face: Secondary | ICD-10-CM | POA: Diagnosis not present

## 2017-11-02 DIAGNOSIS — G4700 Insomnia, unspecified: Secondary | ICD-10-CM | POA: Diagnosis not present

## 2017-11-02 DIAGNOSIS — Z86711 Personal history of pulmonary embolism: Secondary | ICD-10-CM

## 2017-11-02 DIAGNOSIS — I252 Old myocardial infarction: Secondary | ICD-10-CM | POA: Diagnosis not present

## 2017-11-02 DIAGNOSIS — D692 Other nonthrombocytopenic purpura: Secondary | ICD-10-CM

## 2017-11-02 DIAGNOSIS — Z86718 Personal history of other venous thrombosis and embolism: Secondary | ICD-10-CM

## 2017-11-02 DIAGNOSIS — I5031 Acute diastolic (congestive) heart failure: Secondary | ICD-10-CM | POA: Diagnosis not present

## 2017-11-02 DIAGNOSIS — G4733 Obstructive sleep apnea (adult) (pediatric): Secondary | ICD-10-CM | POA: Diagnosis not present

## 2017-11-02 DIAGNOSIS — M6281 Muscle weakness (generalized): Secondary | ICD-10-CM | POA: Diagnosis not present

## 2017-11-02 DIAGNOSIS — I110 Hypertensive heart disease with heart failure: Secondary | ICD-10-CM | POA: Diagnosis not present

## 2017-11-02 DIAGNOSIS — S72142D Displaced intertrochanteric fracture of left femur, subsequent encounter for closed fracture with routine healing: Secondary | ICD-10-CM | POA: Diagnosis not present

## 2017-11-02 DIAGNOSIS — I2510 Atherosclerotic heart disease of native coronary artery without angina pectoris: Secondary | ICD-10-CM | POA: Diagnosis not present

## 2017-11-02 LAB — COAGUCHEK XS/INR WAIVED
INR: 2.2 — ABNORMAL HIGH (ref 0.9–1.1)
Prothrombin Time: 26.9 s

## 2017-11-02 NOTE — Patient Instructions (Signed)
Great to see you!  Change Coumadin to 4 mg daily, we will get another INR in 1 week.

## 2017-11-02 NOTE — Progress Notes (Signed)
   HPI  Patient presents today here for follow-up after nursing home admission.  Patient was admitted to the hospital with broken left hip treated operatively and then discharged to skilled nursing facility.  He feels well and has no complaints today.  There are several housekeeping issues including the need for oxygen prescription and titrating his Coumadin.  His last INR was checked 3 days ago was 1.5.  He was increased from 21 mg a week to 35 mg a week.  He has a good appetite.  He feels like he is getting stronger.  Home health is arranged.  While admitted to the nursing back rupture and was treated with antibiotics.  He was also treated for pneumonia while admitted.  He states that he feels better.  He does not want to address the sebaceous cyst  PMH: Smoking status noted ROS: Per HPI  Objective: BP 117/72   Pulse 88   Temp 98.2 F (36.8 C) (Oral)   Ht _0  (1.753 m)   Wt 213 lb (96.6 kg)   SpO2 93% Comment: on oxygen  BMI 31.45 kg/m  Gen: NAD, alert, cooperative with exam HEENT: NCAT CV: RRR, good S1/S2 Resp: CTABL, no wheezes, non-labored Abd: SNTND, BS present, no guarding or organomegaly Ext: 2+ pitting edema left lower extremity, 1+ pitting edema right lower extremity Neuro: Alert and oriented, No gross deficits Skin: Senile purpura right dorsal hand.  Right upper back with erythematous area under a bandage without any drainage.   Oxygen saturation: With standing on room air patient desaturated to 87%.  When oxygen was replaced at 3 L via nasal cannula saturation came back up to 93%.  Assessment and plan:  #History of pulmonary embolism,  chronic anticoagulation INR today therapeutic, changing from 1.5-2.2 in 3 days I believe this slightly too high of a dose. Decrease to 4 mg daily, INR 1 week  #COPD Patient oxygen dependent after surgery. Request oxygen for home, see saturation qualification above  #Sebaceous cyst Improving as expected No change,  offered general surgery referral after his frequent visits are finished for surgical removal  #Senile purpura Due to age, sun damaged skin, and anticoagulation Continue to monitor     Orders Placed This Encounter  Procedures  . CoaguChek XS/INR Waived  . CBC with Differential/Platelet  . CMP14+EGFR    Meds ordered this encounter  Medications  . finasteride (PROSCAR) 5 MG tablet    Sig: Take 5 mg daily by mouth.    Laroy Apple, MD [facility] 11/02/2017, 8:53 AM

## 2017-11-03 ENCOUNTER — Telehealth: Payer: Self-pay | Admitting: Family Medicine

## 2017-11-03 DIAGNOSIS — S72142D Displaced intertrochanteric fracture of left femur, subsequent encounter for closed fracture with routine healing: Secondary | ICD-10-CM | POA: Diagnosis not present

## 2017-11-03 DIAGNOSIS — I5031 Acute diastolic (congestive) heart failure: Secondary | ICD-10-CM | POA: Diagnosis not present

## 2017-11-03 DIAGNOSIS — E785 Hyperlipidemia, unspecified: Secondary | ICD-10-CM | POA: Diagnosis not present

## 2017-11-03 DIAGNOSIS — C44310 Basal cell carcinoma of skin of unspecified parts of face: Secondary | ICD-10-CM | POA: Diagnosis not present

## 2017-11-03 DIAGNOSIS — G4733 Obstructive sleep apnea (adult) (pediatric): Secondary | ICD-10-CM | POA: Diagnosis not present

## 2017-11-03 DIAGNOSIS — I82409 Acute embolism and thrombosis of unspecified deep veins of unspecified lower extremity: Secondary | ICD-10-CM | POA: Diagnosis not present

## 2017-11-03 DIAGNOSIS — I110 Hypertensive heart disease with heart failure: Secondary | ICD-10-CM | POA: Diagnosis not present

## 2017-11-03 DIAGNOSIS — I252 Old myocardial infarction: Secondary | ICD-10-CM | POA: Diagnosis not present

## 2017-11-03 DIAGNOSIS — L02222 Furuncle of back [any part, except buttock]: Secondary | ICD-10-CM | POA: Diagnosis not present

## 2017-11-03 DIAGNOSIS — M6281 Muscle weakness (generalized): Secondary | ICD-10-CM | POA: Diagnosis not present

## 2017-11-03 DIAGNOSIS — J449 Chronic obstructive pulmonary disease, unspecified: Secondary | ICD-10-CM | POA: Diagnosis not present

## 2017-11-03 DIAGNOSIS — G4700 Insomnia, unspecified: Secondary | ICD-10-CM | POA: Diagnosis not present

## 2017-11-03 DIAGNOSIS — I2510 Atherosclerotic heart disease of native coronary artery without angina pectoris: Secondary | ICD-10-CM | POA: Diagnosis not present

## 2017-11-03 MED ORDER — FINASTERIDE 5 MG PO TABS: 5.0000 mg | ORAL_TABLET | Freq: Every day | ORAL | 3 refills | Status: DC

## 2017-11-03 NOTE — Telephone Encounter (Signed)
Pt wife aware

## 2017-11-03 NOTE — Telephone Encounter (Signed)
Requesting refill for finasteride. Has never been filled at this office. Please advise

## 2017-11-03 NOTE — Telephone Encounter (Signed)
Rx sent as requested.   Laroy Apple, MD [facility] 11/03/2017, 5:16 PM

## 2017-11-04 DIAGNOSIS — M6281 Muscle weakness (generalized): Secondary | ICD-10-CM | POA: Diagnosis not present

## 2017-11-04 DIAGNOSIS — L02222 Furuncle of back [any part, except buttock]: Secondary | ICD-10-CM | POA: Diagnosis not present

## 2017-11-04 DIAGNOSIS — J449 Chronic obstructive pulmonary disease, unspecified: Secondary | ICD-10-CM | POA: Diagnosis not present

## 2017-11-04 DIAGNOSIS — G4700 Insomnia, unspecified: Secondary | ICD-10-CM | POA: Diagnosis not present

## 2017-11-04 DIAGNOSIS — I82409 Acute embolism and thrombosis of unspecified deep veins of unspecified lower extremity: Secondary | ICD-10-CM | POA: Diagnosis not present

## 2017-11-04 DIAGNOSIS — E785 Hyperlipidemia, unspecified: Secondary | ICD-10-CM | POA: Diagnosis not present

## 2017-11-04 DIAGNOSIS — I2510 Atherosclerotic heart disease of native coronary artery without angina pectoris: Secondary | ICD-10-CM | POA: Diagnosis not present

## 2017-11-04 DIAGNOSIS — C44310 Basal cell carcinoma of skin of unspecified parts of face: Secondary | ICD-10-CM | POA: Diagnosis not present

## 2017-11-04 DIAGNOSIS — I5031 Acute diastolic (congestive) heart failure: Secondary | ICD-10-CM | POA: Diagnosis not present

## 2017-11-04 DIAGNOSIS — G4733 Obstructive sleep apnea (adult) (pediatric): Secondary | ICD-10-CM | POA: Diagnosis not present

## 2017-11-04 DIAGNOSIS — S72142D Displaced intertrochanteric fracture of left femur, subsequent encounter for closed fracture with routine healing: Secondary | ICD-10-CM | POA: Diagnosis not present

## 2017-11-04 DIAGNOSIS — I252 Old myocardial infarction: Secondary | ICD-10-CM | POA: Diagnosis not present

## 2017-11-04 DIAGNOSIS — I110 Hypertensive heart disease with heart failure: Secondary | ICD-10-CM | POA: Diagnosis not present

## 2017-11-05 ENCOUNTER — Telehealth: Payer: Self-pay | Admitting: Family Medicine

## 2017-11-05 DIAGNOSIS — I82409 Acute embolism and thrombosis of unspecified deep veins of unspecified lower extremity: Secondary | ICD-10-CM | POA: Diagnosis not present

## 2017-11-05 DIAGNOSIS — S72142D Displaced intertrochanteric fracture of left femur, subsequent encounter for closed fracture with routine healing: Secondary | ICD-10-CM | POA: Diagnosis not present

## 2017-11-05 DIAGNOSIS — I252 Old myocardial infarction: Secondary | ICD-10-CM | POA: Diagnosis not present

## 2017-11-05 DIAGNOSIS — J449 Chronic obstructive pulmonary disease, unspecified: Secondary | ICD-10-CM | POA: Diagnosis not present

## 2017-11-05 DIAGNOSIS — G4733 Obstructive sleep apnea (adult) (pediatric): Secondary | ICD-10-CM | POA: Diagnosis not present

## 2017-11-05 DIAGNOSIS — L02222 Furuncle of back [any part, except buttock]: Secondary | ICD-10-CM | POA: Diagnosis not present

## 2017-11-05 DIAGNOSIS — I110 Hypertensive heart disease with heart failure: Secondary | ICD-10-CM | POA: Diagnosis not present

## 2017-11-05 DIAGNOSIS — G4700 Insomnia, unspecified: Secondary | ICD-10-CM | POA: Diagnosis not present

## 2017-11-05 DIAGNOSIS — I5031 Acute diastolic (congestive) heart failure: Secondary | ICD-10-CM | POA: Diagnosis not present

## 2017-11-05 DIAGNOSIS — I2510 Atherosclerotic heart disease of native coronary artery without angina pectoris: Secondary | ICD-10-CM | POA: Diagnosis not present

## 2017-11-05 DIAGNOSIS — E785 Hyperlipidemia, unspecified: Secondary | ICD-10-CM | POA: Diagnosis not present

## 2017-11-05 DIAGNOSIS — M6281 Muscle weakness (generalized): Secondary | ICD-10-CM | POA: Diagnosis not present

## 2017-11-05 DIAGNOSIS — C44310 Basal cell carcinoma of skin of unspecified parts of face: Secondary | ICD-10-CM | POA: Diagnosis not present

## 2017-11-06 ENCOUNTER — Ambulatory Visit (INDEPENDENT_AMBULATORY_CARE_PROVIDER_SITE_OTHER): Payer: BLUE CROSS/BLUE SHIELD | Admitting: *Deleted

## 2017-11-06 DIAGNOSIS — J449 Chronic obstructive pulmonary disease, unspecified: Secondary | ICD-10-CM

## 2017-11-06 DIAGNOSIS — I110 Hypertensive heart disease with heart failure: Secondary | ICD-10-CM | POA: Diagnosis not present

## 2017-11-06 DIAGNOSIS — I2510 Atherosclerotic heart disease of native coronary artery without angina pectoris: Secondary | ICD-10-CM | POA: Diagnosis not present

## 2017-11-06 DIAGNOSIS — I252 Old myocardial infarction: Secondary | ICD-10-CM | POA: Diagnosis not present

## 2017-11-06 DIAGNOSIS — G4700 Insomnia, unspecified: Secondary | ICD-10-CM | POA: Diagnosis not present

## 2017-11-06 DIAGNOSIS — I82409 Acute embolism and thrombosis of unspecified deep veins of unspecified lower extremity: Secondary | ICD-10-CM | POA: Diagnosis not present

## 2017-11-06 DIAGNOSIS — S72142D Displaced intertrochanteric fracture of left femur, subsequent encounter for closed fracture with routine healing: Secondary | ICD-10-CM | POA: Diagnosis not present

## 2017-11-06 DIAGNOSIS — L02222 Furuncle of back [any part, except buttock]: Secondary | ICD-10-CM | POA: Diagnosis not present

## 2017-11-06 DIAGNOSIS — C44310 Basal cell carcinoma of skin of unspecified parts of face: Secondary | ICD-10-CM | POA: Diagnosis not present

## 2017-11-06 DIAGNOSIS — G4733 Obstructive sleep apnea (adult) (pediatric): Secondary | ICD-10-CM | POA: Diagnosis not present

## 2017-11-06 DIAGNOSIS — I5031 Acute diastolic (congestive) heart failure: Secondary | ICD-10-CM | POA: Diagnosis not present

## 2017-11-06 DIAGNOSIS — E785 Hyperlipidemia, unspecified: Secondary | ICD-10-CM | POA: Diagnosis not present

## 2017-11-06 DIAGNOSIS — M6281 Muscle weakness (generalized): Secondary | ICD-10-CM | POA: Diagnosis not present

## 2017-11-06 NOTE — Progress Notes (Signed)
Pt here to check SPO2 readings SPO2% at rest 87 on room air   P 94 SPO2% walking 84% on room air  P 108 SPO2% at rest on 3lpm O2 93%   P 90 SPO2% walking on 3lpm 91   P 98 Results reviewed by Dr Wendi Snipes

## 2017-11-09 ENCOUNTER — Ambulatory Visit: Payer: Self-pay | Admitting: *Deleted

## 2017-11-09 DIAGNOSIS — M6281 Muscle weakness (generalized): Secondary | ICD-10-CM | POA: Diagnosis not present

## 2017-11-09 DIAGNOSIS — S72142D Displaced intertrochanteric fracture of left femur, subsequent encounter for closed fracture with routine healing: Secondary | ICD-10-CM | POA: Diagnosis not present

## 2017-11-09 DIAGNOSIS — E785 Hyperlipidemia, unspecified: Secondary | ICD-10-CM | POA: Diagnosis not present

## 2017-11-09 DIAGNOSIS — J449 Chronic obstructive pulmonary disease, unspecified: Secondary | ICD-10-CM | POA: Diagnosis not present

## 2017-11-09 DIAGNOSIS — I110 Hypertensive heart disease with heart failure: Secondary | ICD-10-CM | POA: Diagnosis not present

## 2017-11-09 DIAGNOSIS — I82409 Acute embolism and thrombosis of unspecified deep veins of unspecified lower extremity: Secondary | ICD-10-CM | POA: Diagnosis not present

## 2017-11-09 DIAGNOSIS — Z86711 Personal history of pulmonary embolism: Secondary | ICD-10-CM

## 2017-11-09 DIAGNOSIS — I5031 Acute diastolic (congestive) heart failure: Secondary | ICD-10-CM | POA: Diagnosis not present

## 2017-11-09 DIAGNOSIS — I2510 Atherosclerotic heart disease of native coronary artery without angina pectoris: Secondary | ICD-10-CM | POA: Diagnosis not present

## 2017-11-09 DIAGNOSIS — G4700 Insomnia, unspecified: Secondary | ICD-10-CM | POA: Diagnosis not present

## 2017-11-09 DIAGNOSIS — G4733 Obstructive sleep apnea (adult) (pediatric): Secondary | ICD-10-CM | POA: Diagnosis not present

## 2017-11-09 DIAGNOSIS — C44310 Basal cell carcinoma of skin of unspecified parts of face: Secondary | ICD-10-CM | POA: Diagnosis not present

## 2017-11-09 DIAGNOSIS — L02222 Furuncle of back [any part, except buttock]: Secondary | ICD-10-CM | POA: Diagnosis not present

## 2017-11-09 DIAGNOSIS — I252 Old myocardial infarction: Secondary | ICD-10-CM | POA: Diagnosis not present

## 2017-11-09 DIAGNOSIS — Z86718 Personal history of other venous thrombosis and embolism: Secondary | ICD-10-CM

## 2017-11-09 LAB — POCT INR: INR: 3.6 — AB (ref <=1.1)

## 2017-11-09 NOTE — Progress Notes (Addendum)
Spoke with patients wife.  He was on coumadin 5mg  while in the hospital but that was decreased to 4mg  at his last visit on 11/02/17.  He has not taken any extra doses and he hasn't had any other medication changes.  No bleeding or unusual bruising.   INR 3.6 today.  He has 2mg  tablets.  What would you recommend?  Addendum Recommend reducing dose to 3 mg on M, W, F, otherwise continue 4 mg.  Repeat in 1 week  Laroy Apple, MD [facility] 11/09/2017, 4:17 PM   Advised home health nurse, Anderson Malta with [facility], to have patient take 1.5 tablets (3mg ) on M, W, F and 2 tablets (4mg ) all other days and to recheck next Monday. She stated understanding and will relay to patient.   Chong Sicilian, RN

## 2017-11-09 NOTE — Progress Notes (Signed)
VM - PT & INR today PT 43.0 INR 3.6 Please contact Anderson Malta with [facility] with any changes

## 2017-11-10 DIAGNOSIS — E785 Hyperlipidemia, unspecified: Secondary | ICD-10-CM | POA: Diagnosis not present

## 2017-11-10 DIAGNOSIS — G4733 Obstructive sleep apnea (adult) (pediatric): Secondary | ICD-10-CM | POA: Diagnosis not present

## 2017-11-10 DIAGNOSIS — L02222 Furuncle of back [any part, except buttock]: Secondary | ICD-10-CM | POA: Diagnosis not present

## 2017-11-10 DIAGNOSIS — S72142D Displaced intertrochanteric fracture of left femur, subsequent encounter for closed fracture with routine healing: Secondary | ICD-10-CM | POA: Diagnosis not present

## 2017-11-10 DIAGNOSIS — J449 Chronic obstructive pulmonary disease, unspecified: Secondary | ICD-10-CM | POA: Diagnosis not present

## 2017-11-10 DIAGNOSIS — C44310 Basal cell carcinoma of skin of unspecified parts of face: Secondary | ICD-10-CM | POA: Diagnosis not present

## 2017-11-10 DIAGNOSIS — I5031 Acute diastolic (congestive) heart failure: Secondary | ICD-10-CM | POA: Diagnosis not present

## 2017-11-10 DIAGNOSIS — I82409 Acute embolism and thrombosis of unspecified deep veins of unspecified lower extremity: Secondary | ICD-10-CM | POA: Diagnosis not present

## 2017-11-10 DIAGNOSIS — M6281 Muscle weakness (generalized): Secondary | ICD-10-CM | POA: Diagnosis not present

## 2017-11-10 DIAGNOSIS — I110 Hypertensive heart disease with heart failure: Secondary | ICD-10-CM | POA: Diagnosis not present

## 2017-11-10 DIAGNOSIS — I252 Old myocardial infarction: Secondary | ICD-10-CM | POA: Diagnosis not present

## 2017-11-10 DIAGNOSIS — G4700 Insomnia, unspecified: Secondary | ICD-10-CM | POA: Diagnosis not present

## 2017-11-10 DIAGNOSIS — I2510 Atherosclerotic heart disease of native coronary artery without angina pectoris: Secondary | ICD-10-CM | POA: Diagnosis not present

## 2017-11-11 ENCOUNTER — Encounter (INDEPENDENT_AMBULATORY_CARE_PROVIDER_SITE_OTHER): Payer: BLUE CROSS/BLUE SHIELD | Admitting: Ophthalmology

## 2017-11-11 DIAGNOSIS — G4733 Obstructive sleep apnea (adult) (pediatric): Secondary | ICD-10-CM | POA: Diagnosis not present

## 2017-11-11 DIAGNOSIS — I110 Hypertensive heart disease with heart failure: Secondary | ICD-10-CM | POA: Diagnosis not present

## 2017-11-11 DIAGNOSIS — I252 Old myocardial infarction: Secondary | ICD-10-CM | POA: Diagnosis not present

## 2017-11-11 DIAGNOSIS — C44310 Basal cell carcinoma of skin of unspecified parts of face: Secondary | ICD-10-CM | POA: Diagnosis not present

## 2017-11-11 DIAGNOSIS — S72142D Displaced intertrochanteric fracture of left femur, subsequent encounter for closed fracture with routine healing: Secondary | ICD-10-CM | POA: Diagnosis not present

## 2017-11-11 DIAGNOSIS — L02222 Furuncle of back [any part, except buttock]: Secondary | ICD-10-CM | POA: Diagnosis not present

## 2017-11-11 DIAGNOSIS — J449 Chronic obstructive pulmonary disease, unspecified: Secondary | ICD-10-CM | POA: Diagnosis not present

## 2017-11-11 DIAGNOSIS — E785 Hyperlipidemia, unspecified: Secondary | ICD-10-CM | POA: Diagnosis not present

## 2017-11-11 DIAGNOSIS — M6281 Muscle weakness (generalized): Secondary | ICD-10-CM | POA: Diagnosis not present

## 2017-11-11 DIAGNOSIS — I2510 Atherosclerotic heart disease of native coronary artery without angina pectoris: Secondary | ICD-10-CM | POA: Diagnosis not present

## 2017-11-11 DIAGNOSIS — I5031 Acute diastolic (congestive) heart failure: Secondary | ICD-10-CM | POA: Diagnosis not present

## 2017-11-11 DIAGNOSIS — G4700 Insomnia, unspecified: Secondary | ICD-10-CM | POA: Diagnosis not present

## 2017-11-11 DIAGNOSIS — I82409 Acute embolism and thrombosis of unspecified deep veins of unspecified lower extremity: Secondary | ICD-10-CM | POA: Diagnosis not present

## 2017-11-12 DIAGNOSIS — J449 Chronic obstructive pulmonary disease, unspecified: Secondary | ICD-10-CM | POA: Diagnosis not present

## 2017-11-12 DIAGNOSIS — Z08 Encounter for follow-up examination after completed treatment for malignant neoplasm: Secondary | ICD-10-CM | POA: Diagnosis not present

## 2017-11-12 DIAGNOSIS — I252 Old myocardial infarction: Secondary | ICD-10-CM | POA: Diagnosis not present

## 2017-11-12 DIAGNOSIS — G4700 Insomnia, unspecified: Secondary | ICD-10-CM | POA: Diagnosis not present

## 2017-11-12 DIAGNOSIS — S72142D Displaced intertrochanteric fracture of left femur, subsequent encounter for closed fracture with routine healing: Secondary | ICD-10-CM | POA: Diagnosis not present

## 2017-11-12 DIAGNOSIS — I110 Hypertensive heart disease with heart failure: Secondary | ICD-10-CM | POA: Diagnosis not present

## 2017-11-12 DIAGNOSIS — I82409 Acute embolism and thrombosis of unspecified deep veins of unspecified lower extremity: Secondary | ICD-10-CM | POA: Diagnosis not present

## 2017-11-12 DIAGNOSIS — E785 Hyperlipidemia, unspecified: Secondary | ICD-10-CM | POA: Diagnosis not present

## 2017-11-12 DIAGNOSIS — I2510 Atherosclerotic heart disease of native coronary artery without angina pectoris: Secondary | ICD-10-CM | POA: Diagnosis not present

## 2017-11-12 DIAGNOSIS — G4733 Obstructive sleep apnea (adult) (pediatric): Secondary | ICD-10-CM | POA: Diagnosis not present

## 2017-11-12 DIAGNOSIS — I5031 Acute diastolic (congestive) heart failure: Secondary | ICD-10-CM | POA: Diagnosis not present

## 2017-11-12 DIAGNOSIS — M6281 Muscle weakness (generalized): Secondary | ICD-10-CM | POA: Diagnosis not present

## 2017-11-12 DIAGNOSIS — C44310 Basal cell carcinoma of skin of unspecified parts of face: Secondary | ICD-10-CM | POA: Diagnosis not present

## 2017-11-12 DIAGNOSIS — L02222 Furuncle of back [any part, except buttock]: Secondary | ICD-10-CM | POA: Diagnosis not present

## 2017-11-12 DIAGNOSIS — Z85828 Personal history of other malignant neoplasm of skin: Secondary | ICD-10-CM | POA: Diagnosis not present

## 2017-11-13 DIAGNOSIS — L02222 Furuncle of back [any part, except buttock]: Secondary | ICD-10-CM | POA: Diagnosis not present

## 2017-11-13 DIAGNOSIS — M6281 Muscle weakness (generalized): Secondary | ICD-10-CM | POA: Diagnosis not present

## 2017-11-13 DIAGNOSIS — J449 Chronic obstructive pulmonary disease, unspecified: Secondary | ICD-10-CM | POA: Diagnosis not present

## 2017-11-13 DIAGNOSIS — I252 Old myocardial infarction: Secondary | ICD-10-CM | POA: Diagnosis not present

## 2017-11-13 DIAGNOSIS — I110 Hypertensive heart disease with heart failure: Secondary | ICD-10-CM | POA: Diagnosis not present

## 2017-11-13 DIAGNOSIS — G4700 Insomnia, unspecified: Secondary | ICD-10-CM | POA: Diagnosis not present

## 2017-11-13 DIAGNOSIS — G4733 Obstructive sleep apnea (adult) (pediatric): Secondary | ICD-10-CM | POA: Diagnosis not present

## 2017-11-13 DIAGNOSIS — S72142D Displaced intertrochanteric fracture of left femur, subsequent encounter for closed fracture with routine healing: Secondary | ICD-10-CM | POA: Diagnosis not present

## 2017-11-13 DIAGNOSIS — E785 Hyperlipidemia, unspecified: Secondary | ICD-10-CM | POA: Diagnosis not present

## 2017-11-13 DIAGNOSIS — C44310 Basal cell carcinoma of skin of unspecified parts of face: Secondary | ICD-10-CM | POA: Diagnosis not present

## 2017-11-13 DIAGNOSIS — I82409 Acute embolism and thrombosis of unspecified deep veins of unspecified lower extremity: Secondary | ICD-10-CM | POA: Diagnosis not present

## 2017-11-13 DIAGNOSIS — I5031 Acute diastolic (congestive) heart failure: Secondary | ICD-10-CM | POA: Diagnosis not present

## 2017-11-13 DIAGNOSIS — I2510 Atherosclerotic heart disease of native coronary artery without angina pectoris: Secondary | ICD-10-CM | POA: Diagnosis not present

## 2017-11-16 ENCOUNTER — Ambulatory Visit (INDEPENDENT_AMBULATORY_CARE_PROVIDER_SITE_OTHER): Payer: BLUE CROSS/BLUE SHIELD

## 2017-11-16 DIAGNOSIS — G4733 Obstructive sleep apnea (adult) (pediatric): Secondary | ICD-10-CM | POA: Diagnosis not present

## 2017-11-16 DIAGNOSIS — Z86718 Personal history of other venous thrombosis and embolism: Secondary | ICD-10-CM

## 2017-11-16 DIAGNOSIS — I110 Hypertensive heart disease with heart failure: Secondary | ICD-10-CM | POA: Diagnosis not present

## 2017-11-16 DIAGNOSIS — I5031 Acute diastolic (congestive) heart failure: Secondary | ICD-10-CM | POA: Diagnosis not present

## 2017-11-16 DIAGNOSIS — I82409 Acute embolism and thrombosis of unspecified deep veins of unspecified lower extremity: Secondary | ICD-10-CM | POA: Diagnosis not present

## 2017-11-16 DIAGNOSIS — L02222 Furuncle of back [any part, except buttock]: Secondary | ICD-10-CM | POA: Diagnosis not present

## 2017-11-16 DIAGNOSIS — M6281 Muscle weakness (generalized): Secondary | ICD-10-CM | POA: Diagnosis not present

## 2017-11-16 DIAGNOSIS — Z86711 Personal history of pulmonary embolism: Secondary | ICD-10-CM

## 2017-11-16 DIAGNOSIS — G4700 Insomnia, unspecified: Secondary | ICD-10-CM | POA: Diagnosis not present

## 2017-11-16 DIAGNOSIS — J449 Chronic obstructive pulmonary disease, unspecified: Secondary | ICD-10-CM | POA: Diagnosis not present

## 2017-11-16 DIAGNOSIS — I2510 Atherosclerotic heart disease of native coronary artery without angina pectoris: Secondary | ICD-10-CM | POA: Diagnosis not present

## 2017-11-16 DIAGNOSIS — S72142D Displaced intertrochanteric fracture of left femur, subsequent encounter for closed fracture with routine healing: Secondary | ICD-10-CM | POA: Diagnosis not present

## 2017-11-16 DIAGNOSIS — E785 Hyperlipidemia, unspecified: Secondary | ICD-10-CM | POA: Diagnosis not present

## 2017-11-16 DIAGNOSIS — C44310 Basal cell carcinoma of skin of unspecified parts of face: Secondary | ICD-10-CM | POA: Diagnosis not present

## 2017-11-16 DIAGNOSIS — I252 Old myocardial infarction: Secondary | ICD-10-CM | POA: Diagnosis not present

## 2017-11-16 LAB — POCT INR: INR: 3.8 — AB (ref 0.9–1.1)

## 2017-11-16 NOTE — Progress Notes (Signed)
Travis Cruz from [facility] called  PT 45.2  INR 3.8  Her # is  [phone]

## 2017-11-16 NOTE — Progress Notes (Signed)
INR 3.8 today. Patient is taking 3mg  on M,W,F and 4mg  all other days.  His last INR was 3.6 on 11/09/17 and he was taking 4mg  daily at that time.  What would you recommend?

## 2017-11-16 NOTE — Progress Notes (Signed)
Notified Anderson Malta with [facility] and patient's wife.

## 2017-11-17 DIAGNOSIS — L02222 Furuncle of back [any part, except buttock]: Secondary | ICD-10-CM | POA: Diagnosis not present

## 2017-11-17 DIAGNOSIS — C44310 Basal cell carcinoma of skin of unspecified parts of face: Secondary | ICD-10-CM | POA: Diagnosis not present

## 2017-11-17 DIAGNOSIS — I5031 Acute diastolic (congestive) heart failure: Secondary | ICD-10-CM | POA: Diagnosis not present

## 2017-11-17 DIAGNOSIS — I252 Old myocardial infarction: Secondary | ICD-10-CM | POA: Diagnosis not present

## 2017-11-17 DIAGNOSIS — S72142D Displaced intertrochanteric fracture of left femur, subsequent encounter for closed fracture with routine healing: Secondary | ICD-10-CM | POA: Diagnosis not present

## 2017-11-17 DIAGNOSIS — I110 Hypertensive heart disease with heart failure: Secondary | ICD-10-CM | POA: Diagnosis not present

## 2017-11-17 DIAGNOSIS — E785 Hyperlipidemia, unspecified: Secondary | ICD-10-CM | POA: Diagnosis not present

## 2017-11-17 DIAGNOSIS — I2510 Atherosclerotic heart disease of native coronary artery without angina pectoris: Secondary | ICD-10-CM | POA: Diagnosis not present

## 2017-11-17 DIAGNOSIS — I82409 Acute embolism and thrombosis of unspecified deep veins of unspecified lower extremity: Secondary | ICD-10-CM | POA: Diagnosis not present

## 2017-11-17 DIAGNOSIS — G4700 Insomnia, unspecified: Secondary | ICD-10-CM | POA: Diagnosis not present

## 2017-11-17 DIAGNOSIS — M6281 Muscle weakness (generalized): Secondary | ICD-10-CM | POA: Diagnosis not present

## 2017-11-17 DIAGNOSIS — J449 Chronic obstructive pulmonary disease, unspecified: Secondary | ICD-10-CM | POA: Diagnosis not present

## 2017-11-17 DIAGNOSIS — G4733 Obstructive sleep apnea (adult) (pediatric): Secondary | ICD-10-CM | POA: Diagnosis not present

## 2017-11-18 DIAGNOSIS — J449 Chronic obstructive pulmonary disease, unspecified: Secondary | ICD-10-CM | POA: Diagnosis not present

## 2017-11-18 DIAGNOSIS — G4700 Insomnia, unspecified: Secondary | ICD-10-CM | POA: Diagnosis not present

## 2017-11-18 DIAGNOSIS — E785 Hyperlipidemia, unspecified: Secondary | ICD-10-CM | POA: Diagnosis not present

## 2017-11-18 DIAGNOSIS — G4733 Obstructive sleep apnea (adult) (pediatric): Secondary | ICD-10-CM | POA: Diagnosis not present

## 2017-11-18 DIAGNOSIS — I5031 Acute diastolic (congestive) heart failure: Secondary | ICD-10-CM | POA: Diagnosis not present

## 2017-11-18 DIAGNOSIS — I82409 Acute embolism and thrombosis of unspecified deep veins of unspecified lower extremity: Secondary | ICD-10-CM | POA: Diagnosis not present

## 2017-11-18 DIAGNOSIS — S72142D Displaced intertrochanteric fracture of left femur, subsequent encounter for closed fracture with routine healing: Secondary | ICD-10-CM | POA: Diagnosis not present

## 2017-11-18 DIAGNOSIS — I110 Hypertensive heart disease with heart failure: Secondary | ICD-10-CM | POA: Diagnosis not present

## 2017-11-18 DIAGNOSIS — I252 Old myocardial infarction: Secondary | ICD-10-CM | POA: Diagnosis not present

## 2017-11-18 DIAGNOSIS — M6281 Muscle weakness (generalized): Secondary | ICD-10-CM | POA: Diagnosis not present

## 2017-11-18 DIAGNOSIS — C44310 Basal cell carcinoma of skin of unspecified parts of face: Secondary | ICD-10-CM | POA: Diagnosis not present

## 2017-11-18 DIAGNOSIS — I2510 Atherosclerotic heart disease of native coronary artery without angina pectoris: Secondary | ICD-10-CM | POA: Diagnosis not present

## 2017-11-18 DIAGNOSIS — L02222 Furuncle of back [any part, except buttock]: Secondary | ICD-10-CM | POA: Diagnosis not present

## 2017-11-20 DIAGNOSIS — E785 Hyperlipidemia, unspecified: Secondary | ICD-10-CM | POA: Diagnosis not present

## 2017-11-20 DIAGNOSIS — I2510 Atherosclerotic heart disease of native coronary artery without angina pectoris: Secondary | ICD-10-CM | POA: Diagnosis not present

## 2017-11-20 DIAGNOSIS — I252 Old myocardial infarction: Secondary | ICD-10-CM | POA: Diagnosis not present

## 2017-11-20 DIAGNOSIS — G4733 Obstructive sleep apnea (adult) (pediatric): Secondary | ICD-10-CM | POA: Diagnosis not present

## 2017-11-20 DIAGNOSIS — M6281 Muscle weakness (generalized): Secondary | ICD-10-CM | POA: Diagnosis not present

## 2017-11-20 DIAGNOSIS — S72142D Displaced intertrochanteric fracture of left femur, subsequent encounter for closed fracture with routine healing: Secondary | ICD-10-CM | POA: Diagnosis not present

## 2017-11-20 DIAGNOSIS — I82409 Acute embolism and thrombosis of unspecified deep veins of unspecified lower extremity: Secondary | ICD-10-CM | POA: Diagnosis not present

## 2017-11-20 DIAGNOSIS — G4700 Insomnia, unspecified: Secondary | ICD-10-CM | POA: Diagnosis not present

## 2017-11-20 DIAGNOSIS — C44310 Basal cell carcinoma of skin of unspecified parts of face: Secondary | ICD-10-CM | POA: Diagnosis not present

## 2017-11-20 DIAGNOSIS — I5031 Acute diastolic (congestive) heart failure: Secondary | ICD-10-CM | POA: Diagnosis not present

## 2017-11-20 DIAGNOSIS — I110 Hypertensive heart disease with heart failure: Secondary | ICD-10-CM | POA: Diagnosis not present

## 2017-11-20 DIAGNOSIS — J449 Chronic obstructive pulmonary disease, unspecified: Secondary | ICD-10-CM | POA: Diagnosis not present

## 2017-11-20 DIAGNOSIS — L02222 Furuncle of back [any part, except buttock]: Secondary | ICD-10-CM | POA: Diagnosis not present

## 2017-11-23 ENCOUNTER — Ambulatory Visit (HOSPITAL_COMMUNITY)
Admission: RE | Admit: 2017-11-23 | Discharge: 2017-11-23 | Disposition: A | Discharge status: Home / Self Care | Payer: BLUE CROSS/BLUE SHIELD | Source: Ambulatory Visit | Attending: Urology | Admitting: Urology

## 2017-11-23 ENCOUNTER — Ambulatory Visit (INDEPENDENT_AMBULATORY_CARE_PROVIDER_SITE_OTHER): Payer: BLUE CROSS/BLUE SHIELD | Admitting: *Deleted

## 2017-11-23 DIAGNOSIS — J449 Chronic obstructive pulmonary disease, unspecified: Secondary | ICD-10-CM | POA: Diagnosis not present

## 2017-11-23 DIAGNOSIS — I2510 Atherosclerotic heart disease of native coronary artery without angina pectoris: Secondary | ICD-10-CM | POA: Diagnosis not present

## 2017-11-23 DIAGNOSIS — C3412 Malignant neoplasm of upper lobe, left bronchus or lung: Secondary | ICD-10-CM

## 2017-11-23 DIAGNOSIS — C3431 Malignant neoplasm of lower lobe, right bronchus or lung: Secondary | ICD-10-CM | POA: Insufficient documentation

## 2017-11-23 DIAGNOSIS — E785 Hyperlipidemia, unspecified: Secondary | ICD-10-CM | POA: Diagnosis not present

## 2017-11-23 DIAGNOSIS — I700 Atherosclerosis of aorta: Secondary | ICD-10-CM | POA: Insufficient documentation

## 2017-11-23 DIAGNOSIS — J432 Centrilobular emphysema: Secondary | ICD-10-CM | POA: Diagnosis not present

## 2017-11-23 DIAGNOSIS — G4733 Obstructive sleep apnea (adult) (pediatric): Secondary | ICD-10-CM | POA: Diagnosis not present

## 2017-11-23 DIAGNOSIS — I110 Hypertensive heart disease with heart failure: Secondary | ICD-10-CM | POA: Diagnosis not present

## 2017-11-23 DIAGNOSIS — M6281 Muscle weakness (generalized): Secondary | ICD-10-CM | POA: Diagnosis not present

## 2017-11-23 DIAGNOSIS — I252 Old myocardial infarction: Secondary | ICD-10-CM | POA: Diagnosis not present

## 2017-11-23 DIAGNOSIS — G4700 Insomnia, unspecified: Secondary | ICD-10-CM | POA: Diagnosis not present

## 2017-11-23 DIAGNOSIS — R911 Solitary pulmonary nodule: Secondary | ICD-10-CM | POA: Diagnosis not present

## 2017-11-23 DIAGNOSIS — C44310 Basal cell carcinoma of skin of unspecified parts of face: Secondary | ICD-10-CM | POA: Diagnosis not present

## 2017-11-23 DIAGNOSIS — S72142D Displaced intertrochanteric fracture of left femur, subsequent encounter for closed fracture with routine healing: Secondary | ICD-10-CM | POA: Diagnosis not present

## 2017-11-23 DIAGNOSIS — L02222 Furuncle of back [any part, except buttock]: Secondary | ICD-10-CM | POA: Diagnosis not present

## 2017-11-23 DIAGNOSIS — Z923 Personal history of irradiation: Secondary | ICD-10-CM | POA: Insufficient documentation

## 2017-11-23 DIAGNOSIS — I5031 Acute diastolic (congestive) heart failure: Secondary | ICD-10-CM | POA: Diagnosis not present

## 2017-11-23 DIAGNOSIS — Z86718 Personal history of other venous thrombosis and embolism: Secondary | ICD-10-CM

## 2017-11-23 DIAGNOSIS — I82409 Acute embolism and thrombosis of unspecified deep veins of unspecified lower extremity: Secondary | ICD-10-CM | POA: Diagnosis not present

## 2017-11-23 DIAGNOSIS — Z86711 Personal history of pulmonary embolism: Secondary | ICD-10-CM

## 2017-11-23 LAB — POCT I-STAT CREATININE: Creatinine, Ser: 1 mg/dL (ref 0.61–1.24)

## 2017-11-23 LAB — POCT INR: INR: 2.9 — AB (ref <=1.1)

## 2017-11-23 MED ORDER — IOPAMIDOL (ISOVUE-300) INJECTION 61%
75.0000 mL | Freq: Once | INTRAVENOUS | Status: AC | PRN
  Administered 2017-11-23: 75 mL via INTRAVENOUS

## 2017-11-23 NOTE — Progress Notes (Signed)
VM from Rosa Sanchez with [facility] PT 34.3 & INR 2.9 today Please return her call today with patients dosage for today

## 2017-11-23 NOTE — Progress Notes (Addendum)
INR within range at 2.9 today.  Continue current dose of coumadin, 4mg  on Sun & Thurs and 3 mg all other days.  Notified Anderson Malta, home health nurse with [facility] and patient's wife, [name], of results and recommendations as well. Nurse will rck next Monday.

## 2017-11-24 DIAGNOSIS — I252 Old myocardial infarction: Secondary | ICD-10-CM | POA: Diagnosis not present

## 2017-11-24 DIAGNOSIS — M6281 Muscle weakness (generalized): Secondary | ICD-10-CM | POA: Diagnosis not present

## 2017-11-24 DIAGNOSIS — I5031 Acute diastolic (congestive) heart failure: Secondary | ICD-10-CM | POA: Diagnosis not present

## 2017-11-24 DIAGNOSIS — I82409 Acute embolism and thrombosis of unspecified deep veins of unspecified lower extremity: Secondary | ICD-10-CM | POA: Diagnosis not present

## 2017-11-24 DIAGNOSIS — G4700 Insomnia, unspecified: Secondary | ICD-10-CM | POA: Diagnosis not present

## 2017-11-24 DIAGNOSIS — J449 Chronic obstructive pulmonary disease, unspecified: Secondary | ICD-10-CM | POA: Diagnosis not present

## 2017-11-24 DIAGNOSIS — G4733 Obstructive sleep apnea (adult) (pediatric): Secondary | ICD-10-CM | POA: Diagnosis not present

## 2017-11-24 DIAGNOSIS — I2510 Atherosclerotic heart disease of native coronary artery without angina pectoris: Secondary | ICD-10-CM | POA: Diagnosis not present

## 2017-11-24 DIAGNOSIS — L02222 Furuncle of back [any part, except buttock]: Secondary | ICD-10-CM | POA: Diagnosis not present

## 2017-11-24 DIAGNOSIS — E785 Hyperlipidemia, unspecified: Secondary | ICD-10-CM | POA: Diagnosis not present

## 2017-11-24 DIAGNOSIS — I110 Hypertensive heart disease with heart failure: Secondary | ICD-10-CM | POA: Diagnosis not present

## 2017-11-24 DIAGNOSIS — S72142D Displaced intertrochanteric fracture of left femur, subsequent encounter for closed fracture with routine healing: Secondary | ICD-10-CM | POA: Diagnosis not present

## 2017-11-24 DIAGNOSIS — C44310 Basal cell carcinoma of skin of unspecified parts of face: Secondary | ICD-10-CM | POA: Diagnosis not present

## 2017-11-24 NOTE — Progress Notes (Signed)
[address] 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  Weight changes, if any: Wt Readings from Last 3 Encounters:  11/26/17 210 lb 6.4 oz (95.4 kg)  11/02/17 213 lb (96.6 kg)  10/12/17 210 lb (95.3 kg)   Respiratory complaints, if any: SOB with exertion using O2 2-3 liter all of the time,at night 3 liters nasal cannular Hemoptysis, if any:No Swallowing Problems/Pain/Difficulty swallowing:No Appetite :Good Pain:None When is next chemo scheduled?:None Fell in September broke left hip had surgery. BP 108/70 (BP Location: Left Arm, Patient Position: Sitting, Cuff Size: Normal)   Pulse 77   Temp 98.2 F (36.8 C) (Oral)   Resp 18   Ht 5\' 9"  (1.753 m)   Wt 210 lb 6.4 oz (95.4 kg)   SpO2 94%   BMI 31.07 kg/m

## 2017-11-25 ENCOUNTER — Other Ambulatory Visit: Payer: Self-pay | Admitting: Family Medicine

## 2017-11-25 DIAGNOSIS — I252 Old myocardial infarction: Secondary | ICD-10-CM | POA: Diagnosis not present

## 2017-11-25 DIAGNOSIS — I110 Hypertensive heart disease with heart failure: Secondary | ICD-10-CM | POA: Diagnosis not present

## 2017-11-25 DIAGNOSIS — I5031 Acute diastolic (congestive) heart failure: Secondary | ICD-10-CM | POA: Diagnosis not present

## 2017-11-25 DIAGNOSIS — I2510 Atherosclerotic heart disease of native coronary artery without angina pectoris: Secondary | ICD-10-CM | POA: Diagnosis not present

## 2017-11-25 DIAGNOSIS — G4733 Obstructive sleep apnea (adult) (pediatric): Secondary | ICD-10-CM | POA: Diagnosis not present

## 2017-11-25 DIAGNOSIS — I82409 Acute embolism and thrombosis of unspecified deep veins of unspecified lower extremity: Secondary | ICD-10-CM | POA: Diagnosis not present

## 2017-11-25 DIAGNOSIS — S72142D Displaced intertrochanteric fracture of left femur, subsequent encounter for closed fracture with routine healing: Secondary | ICD-10-CM | POA: Diagnosis not present

## 2017-11-25 DIAGNOSIS — E785 Hyperlipidemia, unspecified: Secondary | ICD-10-CM | POA: Diagnosis not present

## 2017-11-25 DIAGNOSIS — J449 Chronic obstructive pulmonary disease, unspecified: Secondary | ICD-10-CM | POA: Diagnosis not present

## 2017-11-25 DIAGNOSIS — C44310 Basal cell carcinoma of skin of unspecified parts of face: Secondary | ICD-10-CM | POA: Diagnosis not present

## 2017-11-25 DIAGNOSIS — L02222 Furuncle of back [any part, except buttock]: Secondary | ICD-10-CM | POA: Diagnosis not present

## 2017-11-25 DIAGNOSIS — M6281 Muscle weakness (generalized): Secondary | ICD-10-CM | POA: Diagnosis not present

## 2017-11-25 DIAGNOSIS — G4700 Insomnia, unspecified: Secondary | ICD-10-CM | POA: Diagnosis not present

## 2017-11-26 ENCOUNTER — Encounter: Payer: Self-pay | Admitting: Urology

## 2017-11-26 ENCOUNTER — Encounter: Payer: Self-pay | Admitting: Family Medicine

## 2017-11-26 ENCOUNTER — Other Ambulatory Visit: Payer: Self-pay

## 2017-11-26 ENCOUNTER — Ambulatory Visit
Admission: RE | Admit: 2017-11-26 | Discharge: 2017-11-26 | Disposition: A | Discharge status: Home / Self Care | Payer: BLUE CROSS/BLUE SHIELD | Source: Ambulatory Visit | Attending: Urology | Admitting: Urology

## 2017-11-26 VITALS — BP 108/70 | HR 77 | Temp 98.2°F | Resp 18 | Ht 69.0 in | Wt 210.4 lb

## 2017-11-26 DIAGNOSIS — L02222 Furuncle of back [any part, except buttock]: Secondary | ICD-10-CM | POA: Diagnosis not present

## 2017-11-26 DIAGNOSIS — M6281 Muscle weakness (generalized): Secondary | ICD-10-CM | POA: Diagnosis not present

## 2017-11-26 DIAGNOSIS — Z79899 Other long term (current) drug therapy: Secondary | ICD-10-CM | POA: Diagnosis not present

## 2017-11-26 DIAGNOSIS — C3491 Malignant neoplasm of unspecified part of right bronchus or lung: Secondary | ICD-10-CM | POA: Insufficient documentation

## 2017-11-26 DIAGNOSIS — J449 Chronic obstructive pulmonary disease, unspecified: Secondary | ICD-10-CM | POA: Diagnosis not present

## 2017-11-26 DIAGNOSIS — I2510 Atherosclerotic heart disease of native coronary artery without angina pectoris: Secondary | ICD-10-CM | POA: Diagnosis not present

## 2017-11-26 DIAGNOSIS — Z08 Encounter for follow-up examination after completed treatment for malignant neoplasm: Secondary | ICD-10-CM | POA: Diagnosis not present

## 2017-11-26 DIAGNOSIS — G4733 Obstructive sleep apnea (adult) (pediatric): Secondary | ICD-10-CM | POA: Diagnosis not present

## 2017-11-26 DIAGNOSIS — I252 Old myocardial infarction: Secondary | ICD-10-CM | POA: Diagnosis not present

## 2017-11-26 DIAGNOSIS — C44310 Basal cell carcinoma of skin of unspecified parts of face: Secondary | ICD-10-CM | POA: Diagnosis not present

## 2017-11-26 DIAGNOSIS — E785 Hyperlipidemia, unspecified: Secondary | ICD-10-CM | POA: Diagnosis not present

## 2017-11-26 DIAGNOSIS — C3431 Malignant neoplasm of lower lobe, right bronchus or lung: Secondary | ICD-10-CM | POA: Diagnosis not present

## 2017-11-26 DIAGNOSIS — S72142D Displaced intertrochanteric fracture of left femur, subsequent encounter for closed fracture with routine healing: Secondary | ICD-10-CM | POA: Diagnosis not present

## 2017-11-26 DIAGNOSIS — I110 Hypertensive heart disease with heart failure: Secondary | ICD-10-CM | POA: Diagnosis not present

## 2017-11-26 DIAGNOSIS — Y842 Radiological procedure and radiotherapy as the cause of abnormal reaction of the patient, or of later complication, without mention of misadventure at the time of the procedure: Secondary | ICD-10-CM | POA: Diagnosis not present

## 2017-11-26 DIAGNOSIS — I5031 Acute diastolic (congestive) heart failure: Secondary | ICD-10-CM | POA: Diagnosis not present

## 2017-11-26 DIAGNOSIS — Z7901 Long term (current) use of anticoagulants: Secondary | ICD-10-CM | POA: Insufficient documentation

## 2017-11-26 DIAGNOSIS — Z9981 Dependence on supplemental oxygen: Secondary | ICD-10-CM | POA: Diagnosis not present

## 2017-11-26 DIAGNOSIS — I82409 Acute embolism and thrombosis of unspecified deep veins of unspecified lower extremity: Secondary | ICD-10-CM | POA: Diagnosis not present

## 2017-11-26 DIAGNOSIS — G4700 Insomnia, unspecified: Secondary | ICD-10-CM | POA: Diagnosis not present

## 2017-11-26 NOTE — Addendum Note (Signed)
Encounter addended by: Malena Edman, RN on: 11/26/2017 2:39 PM  Actions taken: Charge Capture section accepted

## 2017-11-26 NOTE — Progress Notes (Signed)
Radiation Oncology         [phone] ________________________________  Name: Travis Cruz MRN: [id number]  Date: 11/26/2017  DOB: Feb 14, 1946  Post Treatment Note  CC: Timmothy Euler, MD  Grace Isaac, MD  Diagnosis:  14 mm putative non-small cell carcinoma of the right lower lung - Clinical stage IA     Interval Since Last Radiation:  8 months s/p SBRT to the RLL 03/23/2017 to 04/02/2017:  The target was treated to 50 Gy in 5 fractions of 10 Gy    11/07/13-11/16/13:  Left upper lobe / 60 Gy in 5 fractions at 12 Gray per fraction  Narrative:  The patient returns today for routine follow-up and to review results from recent f/u Chest CT performed on 11/23/17. He has recovered well from the effects of radiotherapy.  CT Imaging reveals an excellent response to radiotherapy with the previously treated right lower lobe nodule no longer visualized. There is stable radiation change in the left upper lobe and right mid lower lobe.  No new lesions are visualized.  On review of systems, the patient states that he is doing well and is currently without complaints.  He specifically denies dysphagia, chest pain, productive cough, hemoptysis, fever, chills, N/V.  He reports that his appetite is good and energy level is stable.  He had a recent fall at home, requiring hospitalization but is recovering well from the resultant left hip fracture which required IM nailing by Dr. Doreatha Martin on 09/09/2017. Unfortunately, he has required 3 L of oxygen via nasal cannula 24/7 following surgery and hospitalization.  He denies recent unintended weight loss or night sweats.  ALLERGIES:  is allergic to ativan [lorazepam].  Meds: Current Outpatient Medications  Medication Sig Dispense Refill  . Ergocalciferol (VITAMIN D2) 2000 units TABS Take by mouth daily.    . finasteride (PROSCAR) 5 MG tablet Take 1 tablet (5 mg total) daily by mouth. 90 tablet 3  . furosemide (LASIX) 40 MG tablet TAKE 1 TABLET BY  MOUTH EVERY DAY 30 tablet 5  . levETIRAcetam (KEPPRA) 500 MG tablet TAKE 1 TABLET (500 MG TOTAL) BY MOUTH 2 TIMES DAILY. 60 tablet 2  . Metoprolol Tartrate 37.5 MG TABS TAKE 1 TABLET BY MOUTH TWICE A DAY 60 tablet 5  . Multiple Vitamin (MULTIVITAMIN) tablet Take 1 tablet by mouth daily.    [name] SPIRIVA HANDIHALER 18 MCG inhalation capsule PLACE 1 CAPSULE INTO INHALER AND INHALE DAILY 30 capsule 3  . tamsulosin (FLOMAX) 0.4 MG CAPS capsule TAKE 1 CAPSULE BY MOUTH EVERY DAY 30 capsule 5  . warfarin (COUMADIN) 2 MG tablet Take 1 tablet (2 mg total) by mouth daily at 6 PM. (Patient taking differently: Take 5 mg daily at 6 PM by mouth. ) 30 tablet 0   No current facility-administered medications for this encounter.     Physical Findings:  height is 5\' 9"  (1.753 m) and weight is 210 lb 6.4 oz (95.4 kg). His oral temperature is 98.2 F (36.8 C). His blood pressure is 108/70 and his pulse is 77. His respiration is 18 and oxygen saturation is 94%.  Pain Assessment Pain Score: 0-No pain/10 In general this is a well appearing caucasian male in no acute distress. He's alert and oriented x4 and appropriate throughout the examination. Cardiopulmonary assessment is negative for acute distress and he exhibits normal effort.   Lab Findings: Lab Results  Component Value Date   WBC 15.6 (H) 09/18/2017   HGB 15.1 09/18/2017   HCT  47.0 09/18/2017   MCV 89.4 09/18/2017   PLT PLATELET CLUMPS NOTED ON SMEAR, UNABLE TO ESTIMATE 09/18/2017     Radiographic Findings: Ct Chest W Contrast  Result Date: 11/24/2017 CLINICAL DATA:  Restaging right lung cancer. EXAM: CT CHEST WITH CONTRAST TECHNIQUE: Multidetector CT imaging of the chest was performed during intravenous contrast administration. CONTRAST:  27mL ISOVUE-300 IOPAMIDOL (ISOVUE-300) INJECTION 61% COMPARISON:  05/19/2017 FINDINGS: Cardiovascular: Right chest wall ICD is noted with lead in the right ventricle. Mild cardiac enlargement. Aortic atherosclerosis.  Calcification within the LAD, left circumflex and RCA coronary artery. Mediastinum/Nodes: The trachea appears patent and is midline. Normal appearance of the esophagus. No enlarged mediastinal or hilar lymph nodes. Lungs/Pleura: Moderate changes of centrilobular and paraseptal emphysema. Stable scarring and architectural distortion in the left upper lobe and right middle lobe. Index nodule within the medial right lower lobe is no longer visualized. Upper Abdomen: No acute abnormality. Musculoskeletal: The bones appear diffusely osteopenic. Chronic bilateral rib fracture deformities identified. Stable chronic fracture deformity involving the T11 vertebra. No aggressive lytic or or sclerotic bone lesions. IMPRESSION: 1. The index nodule in the medial right lower lobe is no longer visualized reflecting post treatment change. 2. Stable radiation change in the left upper lobe and right middle lobe. 3. Aortic Atherosclerosis (ICD10-I70.0) and Emphysema (ICD10-J43.9). 4. LAD, left circumflex and RCA coronary artery calcifications. Electronically Signed   By: Kerby Moors M.D.   On: 11/24/2017 09:28    Impression/Plan: 1. 14 mm putative non-small cell carcinoma of the right lower lung - Clinical stage IA. The patient appears to be doing well following radiotherapy. His most recent CT scan of the chest demonstrates an excellent response to radiotherapy with the previously treated right lower lobe nodule no longer visualized. There is stable radiation change in the left upper lobe and right mid lower lobe.  No new lesions are visualized.  We will continue with serial imaging at six-month intervals until 5 years when, at that point in time, he will have an annual low dose CT scan for surveillance. He will return in 6 months to review the findings from the scan to be ordered prior to that visit.  He knows to call at any time with any questions or concerns in the interim. I will forward a copy of my note today and recent  scan results to Dr. Servando Snare for his records as well.      Nicholos Johns, PA-C

## 2017-11-27 DIAGNOSIS — I110 Hypertensive heart disease with heart failure: Secondary | ICD-10-CM | POA: Diagnosis not present

## 2017-11-27 DIAGNOSIS — I5031 Acute diastolic (congestive) heart failure: Secondary | ICD-10-CM | POA: Diagnosis not present

## 2017-11-27 DIAGNOSIS — I82409 Acute embolism and thrombosis of unspecified deep veins of unspecified lower extremity: Secondary | ICD-10-CM | POA: Diagnosis not present

## 2017-11-27 DIAGNOSIS — G4700 Insomnia, unspecified: Secondary | ICD-10-CM | POA: Diagnosis not present

## 2017-11-27 DIAGNOSIS — S72142D Displaced intertrochanteric fracture of left femur, subsequent encounter for closed fracture with routine healing: Secondary | ICD-10-CM | POA: Diagnosis not present

## 2017-11-27 DIAGNOSIS — C44310 Basal cell carcinoma of skin of unspecified parts of face: Secondary | ICD-10-CM | POA: Diagnosis not present

## 2017-11-27 DIAGNOSIS — J449 Chronic obstructive pulmonary disease, unspecified: Secondary | ICD-10-CM | POA: Diagnosis not present

## 2017-11-27 DIAGNOSIS — E785 Hyperlipidemia, unspecified: Secondary | ICD-10-CM | POA: Diagnosis not present

## 2017-11-27 DIAGNOSIS — G4733 Obstructive sleep apnea (adult) (pediatric): Secondary | ICD-10-CM | POA: Diagnosis not present

## 2017-11-27 DIAGNOSIS — I2510 Atherosclerotic heart disease of native coronary artery without angina pectoris: Secondary | ICD-10-CM | POA: Diagnosis not present

## 2017-11-27 DIAGNOSIS — I252 Old myocardial infarction: Secondary | ICD-10-CM | POA: Diagnosis not present

## 2017-11-27 DIAGNOSIS — L02222 Furuncle of back [any part, except buttock]: Secondary | ICD-10-CM | POA: Diagnosis not present

## 2017-11-27 DIAGNOSIS — M6281 Muscle weakness (generalized): Secondary | ICD-10-CM | POA: Diagnosis not present

## 2017-11-30 ENCOUNTER — Ambulatory Visit: Payer: Self-pay | Admitting: *Deleted

## 2017-11-30 DIAGNOSIS — I252 Old myocardial infarction: Secondary | ICD-10-CM | POA: Diagnosis not present

## 2017-11-30 DIAGNOSIS — I2510 Atherosclerotic heart disease of native coronary artery without angina pectoris: Secondary | ICD-10-CM | POA: Diagnosis not present

## 2017-11-30 DIAGNOSIS — S72142D Displaced intertrochanteric fracture of left femur, subsequent encounter for closed fracture with routine healing: Secondary | ICD-10-CM | POA: Diagnosis not present

## 2017-11-30 DIAGNOSIS — G4700 Insomnia, unspecified: Secondary | ICD-10-CM | POA: Diagnosis not present

## 2017-11-30 DIAGNOSIS — I110 Hypertensive heart disease with heart failure: Secondary | ICD-10-CM | POA: Diagnosis not present

## 2017-11-30 DIAGNOSIS — Z86711 Personal history of pulmonary embolism: Secondary | ICD-10-CM

## 2017-11-30 DIAGNOSIS — Z86718 Personal history of other venous thrombosis and embolism: Secondary | ICD-10-CM

## 2017-11-30 DIAGNOSIS — L02222 Furuncle of back [any part, except buttock]: Secondary | ICD-10-CM | POA: Diagnosis not present

## 2017-11-30 DIAGNOSIS — E785 Hyperlipidemia, unspecified: Secondary | ICD-10-CM | POA: Diagnosis not present

## 2017-11-30 DIAGNOSIS — J449 Chronic obstructive pulmonary disease, unspecified: Secondary | ICD-10-CM | POA: Diagnosis not present

## 2017-11-30 DIAGNOSIS — I82409 Acute embolism and thrombosis of unspecified deep veins of unspecified lower extremity: Secondary | ICD-10-CM | POA: Diagnosis not present

## 2017-11-30 DIAGNOSIS — I5031 Acute diastolic (congestive) heart failure: Secondary | ICD-10-CM | POA: Diagnosis not present

## 2017-11-30 DIAGNOSIS — C44310 Basal cell carcinoma of skin of unspecified parts of face: Secondary | ICD-10-CM | POA: Diagnosis not present

## 2017-11-30 DIAGNOSIS — G4733 Obstructive sleep apnea (adult) (pediatric): Secondary | ICD-10-CM | POA: Diagnosis not present

## 2017-11-30 DIAGNOSIS — M6281 Muscle weakness (generalized): Secondary | ICD-10-CM | POA: Diagnosis not present

## 2017-11-30 LAB — PROTIME-INR: INR: 2.7 — AB (ref 0.9–1.1)

## 2017-11-30 NOTE — Progress Notes (Signed)
VM rcvd from [facility] w/ [facility] PT 37.4 INR 2.7

## 2017-11-30 NOTE — Progress Notes (Signed)
Patient had no extra or missed doses this week.  No unusual bleeding, new medications, or changes to diet.  He takes Coumadin 4 mg on Sundays and Thursdays, and 3 mg all other days.  Per Dr. Wendi Snipes, advised Anderson Malta and patient's wife [name] notified to keep dosage the same.   Anderson Malta states she will recheck next Monday 12/07/17.

## 2017-12-02 DIAGNOSIS — J449 Chronic obstructive pulmonary disease, unspecified: Secondary | ICD-10-CM | POA: Diagnosis not present

## 2017-12-02 DIAGNOSIS — I110 Hypertensive heart disease with heart failure: Secondary | ICD-10-CM | POA: Diagnosis not present

## 2017-12-02 DIAGNOSIS — I82409 Acute embolism and thrombosis of unspecified deep veins of unspecified lower extremity: Secondary | ICD-10-CM | POA: Diagnosis not present

## 2017-12-02 DIAGNOSIS — G4733 Obstructive sleep apnea (adult) (pediatric): Secondary | ICD-10-CM | POA: Diagnosis not present

## 2017-12-02 DIAGNOSIS — C44310 Basal cell carcinoma of skin of unspecified parts of face: Secondary | ICD-10-CM | POA: Diagnosis not present

## 2017-12-02 DIAGNOSIS — I252 Old myocardial infarction: Secondary | ICD-10-CM | POA: Diagnosis not present

## 2017-12-02 DIAGNOSIS — I5031 Acute diastolic (congestive) heart failure: Secondary | ICD-10-CM | POA: Diagnosis not present

## 2017-12-02 DIAGNOSIS — M6281 Muscle weakness (generalized): Secondary | ICD-10-CM | POA: Diagnosis not present

## 2017-12-02 DIAGNOSIS — L02222 Furuncle of back [any part, except buttock]: Secondary | ICD-10-CM | POA: Diagnosis not present

## 2017-12-02 DIAGNOSIS — S72142D Displaced intertrochanteric fracture of left femur, subsequent encounter for closed fracture with routine healing: Secondary | ICD-10-CM | POA: Diagnosis not present

## 2017-12-02 DIAGNOSIS — G4700 Insomnia, unspecified: Secondary | ICD-10-CM | POA: Diagnosis not present

## 2017-12-02 DIAGNOSIS — E785 Hyperlipidemia, unspecified: Secondary | ICD-10-CM | POA: Diagnosis not present

## 2017-12-02 DIAGNOSIS — I2510 Atherosclerotic heart disease of native coronary artery without angina pectoris: Secondary | ICD-10-CM | POA: Diagnosis not present

## 2017-12-09 ENCOUNTER — Ambulatory Visit (INDEPENDENT_AMBULATORY_CARE_PROVIDER_SITE_OTHER): Payer: BLUE CROSS/BLUE SHIELD | Admitting: *Deleted

## 2017-12-09 DIAGNOSIS — I5031 Acute diastolic (congestive) heart failure: Secondary | ICD-10-CM | POA: Diagnosis not present

## 2017-12-09 DIAGNOSIS — M6281 Muscle weakness (generalized): Secondary | ICD-10-CM | POA: Diagnosis not present

## 2017-12-09 DIAGNOSIS — I252 Old myocardial infarction: Secondary | ICD-10-CM | POA: Diagnosis not present

## 2017-12-09 DIAGNOSIS — G4700 Insomnia, unspecified: Secondary | ICD-10-CM | POA: Diagnosis not present

## 2017-12-09 DIAGNOSIS — I2510 Atherosclerotic heart disease of native coronary artery without angina pectoris: Secondary | ICD-10-CM | POA: Diagnosis not present

## 2017-12-09 DIAGNOSIS — I82409 Acute embolism and thrombosis of unspecified deep veins of unspecified lower extremity: Secondary | ICD-10-CM | POA: Diagnosis not present

## 2017-12-09 DIAGNOSIS — I110 Hypertensive heart disease with heart failure: Secondary | ICD-10-CM | POA: Diagnosis not present

## 2017-12-09 DIAGNOSIS — Z86711 Personal history of pulmonary embolism: Secondary | ICD-10-CM

## 2017-12-09 DIAGNOSIS — Z86718 Personal history of other venous thrombosis and embolism: Secondary | ICD-10-CM

## 2017-12-09 DIAGNOSIS — G4733 Obstructive sleep apnea (adult) (pediatric): Secondary | ICD-10-CM | POA: Diagnosis not present

## 2017-12-09 DIAGNOSIS — L02222 Furuncle of back [any part, except buttock]: Secondary | ICD-10-CM | POA: Diagnosis not present

## 2017-12-09 DIAGNOSIS — S72142D Displaced intertrochanteric fracture of left femur, subsequent encounter for closed fracture with routine healing: Secondary | ICD-10-CM | POA: Diagnosis not present

## 2017-12-09 DIAGNOSIS — E785 Hyperlipidemia, unspecified: Secondary | ICD-10-CM | POA: Diagnosis not present

## 2017-12-09 DIAGNOSIS — C44310 Basal cell carcinoma of skin of unspecified parts of face: Secondary | ICD-10-CM | POA: Diagnosis not present

## 2017-12-09 DIAGNOSIS — J449 Chronic obstructive pulmonary disease, unspecified: Secondary | ICD-10-CM | POA: Diagnosis not present

## 2017-12-09 LAB — POCT INR: INR: 3.1 — AB (ref <=1.1)

## 2017-12-09 NOTE — Progress Notes (Signed)
Spoke with Anderson Malta at [facility]. Patient will take 3mg  tomorrow instead of 4mg  and 3 mg on Fri and Sat. 4mg  on Sun as usual. Rck on Monday.

## 2017-12-09 NOTE — Progress Notes (Signed)
PT 37.3 INR 3.1

## 2017-12-10 ENCOUNTER — Ambulatory Visit (INDEPENDENT_AMBULATORY_CARE_PROVIDER_SITE_OTHER): Payer: BLUE CROSS/BLUE SHIELD | Admitting: Family Medicine

## 2017-12-10 ENCOUNTER — Encounter: Payer: Self-pay | Admitting: Family Medicine

## 2017-12-10 VITALS — BP 102/72 | HR 81 | Temp 97.1°F | Ht 69.0 in | Wt 225.6 lb

## 2017-12-10 DIAGNOSIS — J449 Chronic obstructive pulmonary disease, unspecified: Secondary | ICD-10-CM

## 2017-12-10 DIAGNOSIS — E785 Hyperlipidemia, unspecified: Secondary | ICD-10-CM | POA: Diagnosis not present

## 2017-12-10 DIAGNOSIS — Z86711 Personal history of pulmonary embolism: Secondary | ICD-10-CM

## 2017-12-10 NOTE — Patient Instructions (Addendum)
Great to see you!  We will contact you with lab results within 1 week.

## 2017-12-10 NOTE — Progress Notes (Signed)
   HPI  Patient presents today for follow-up chronic medical conditions.  COPD Patient has home oxygen now arranged, he seems to be doing well and his breathing is at baseline. Good medication compliance.   History of pulmonary embolism INR 3.1 checked a few days ago, will soon change to every 2-week checks.  Hyperlipidemia Nonfasting today No medications  PMH: Smoking status noted ROS: Per HPI  Objective: BP 102/72   Pulse 81   Temp (!) 97.1 F (36.2 C) (Oral)   Ht '5\' 9"'$  (1.753 m)   Wt 225 lb 9.6 oz (102.3 kg)   SpO2 93%   BMI 33.32 kg/m  Gen: NAD, alert, cooperative with exam HEENT: NCAT CV: RRR, good S1/S2, no murmur Resp: CTABL, oxygen present via nasal cannula Ext: No edema, warm Neuro: Alert and oriented, No gross deficits  Assessment and plan:  #COPD At baseline, continue Spiriva plus oxygen No changes  #History of pulmonary embolism Continue Coumadin, INR checks weekly currently by home health, will soon change to every 2 weeks when stable and then monthly in our clinic  #Hyperlipidemia Previous history of CVA and MI, LDL goal less than 70 Statin at reduced frequency due to myalgia    Orders Placed This Encounter  Procedures  . CBC with Differential/Platelet  . CMP14+EGFR  . Lipid panel    Laroy Apple, MD [facility] 12/10/2017, 8:51 AM

## 2017-12-11 LAB — CMP14+EGFR
ALT: 17 IU/L (ref 0–44)
AST: 22 IU/L (ref 0–40)
Albumin/Globulin Ratio: 1.1 — ABNORMAL LOW (ref 1.2–2.2)
Albumin: 3.8 g/dL (ref 3.6–4.8)
Alkaline Phosphatase: 156 IU/L — ABNORMAL HIGH (ref 39–117)
BUN/Creatinine Ratio: 20 (ref 10–24)
BUN: 21 mg/dL (ref 8–27)
Bilirubin Total: 0.3 mg/dL (ref 0.0–1.2)
CO2: 29 mmol/L (ref 20–29)
Calcium: 9.6 mg/dL (ref 8.6–10.2)
Chloride: 101 mmol/L (ref 96–106)
Creatinine, Ser: 1.03 mg/dL (ref 0.76–1.27)
GFR calc Af Amer: 85 mL/min/{1.73_m2} (ref >59)
GFR calc non Af Amer: 74 mL/min/{1.73_m2} (ref >59)
Globulin, Total: 3.4 g/dL (ref 1.5–4.5)
Glucose: 103 mg/dL — ABNORMAL HIGH (ref 65–99)
Potassium: 4.7 mmol/L (ref 3.5–5.2)
Sodium: 144 mmol/L (ref 134–144)
Total Protein: 7.2 g/dL (ref 6.0–8.5)

## 2017-12-11 LAB — LIPID PANEL
Chol/HDL Ratio: 4.9 ratio (ref 0.0–5.0)
Cholesterol, Total: 229 mg/dL — ABNORMAL HIGH (ref 100–199)
HDL: 47 mg/dL (ref >39)
LDL Calculated: 153 mg/dL — ABNORMAL HIGH (ref 0–99)
Triglycerides: 147 mg/dL (ref 0–149)
VLDL Cholesterol Cal: 29 mg/dL (ref 5–40)

## 2017-12-11 LAB — CBC WITH DIFFERENTIAL/PLATELET
Basophils Absolute: 0 10*3/uL (ref 0.0–0.2)
Basos: 0 %
EOS (ABSOLUTE): 0.2 10*3/uL (ref 0.0–0.4)
Eos: 2 %
Hematocrit: 50.8 % (ref 37.5–51.0)
Hemoglobin: 16.9 g/dL (ref 13.0–17.7)
Immature Grans (Abs): 0 10*3/uL (ref 0.0–0.1)
Immature Granulocytes: 0 %
Lymphocytes Absolute: 3.2 10*3/uL — ABNORMAL HIGH (ref 0.7–3.1)
Lymphs: 25 %
MCH: 29.6 pg (ref 26.6–33.0)
MCHC: 33.3 g/dL (ref 31.5–35.7)
MCV: 89 fL (ref 79–97)
Monocytes Absolute: 1.7 10*3/uL — ABNORMAL HIGH (ref 0.1–0.9)
Monocytes: 13 %
Neutrophils Absolute: 7.5 10*3/uL — ABNORMAL HIGH (ref 1.4–7.0)
Neutrophils: 60 %
Platelets: 188 10*3/uL (ref 150–379)
RBC: 5.71 x10E6/uL (ref 4.14–5.80)
RDW: 15 % (ref 12.3–15.4)
WBC: 12.7 10*3/uL — ABNORMAL HIGH (ref 3.4–10.8)

## 2017-12-14 ENCOUNTER — Encounter (INDEPENDENT_AMBULATORY_CARE_PROVIDER_SITE_OTHER): Payer: BLUE CROSS/BLUE SHIELD | Admitting: Ophthalmology

## 2017-12-14 DIAGNOSIS — H35033 Hypertensive retinopathy, bilateral: Secondary | ICD-10-CM | POA: Diagnosis not present

## 2017-12-14 DIAGNOSIS — H353132 Nonexudative age-related macular degeneration, bilateral, intermediate dry stage: Secondary | ICD-10-CM

## 2017-12-14 DIAGNOSIS — I10 Essential (primary) hypertension: Secondary | ICD-10-CM | POA: Diagnosis not present

## 2017-12-14 DIAGNOSIS — H43813 Vitreous degeneration, bilateral: Secondary | ICD-10-CM | POA: Diagnosis not present

## 2017-12-15 ENCOUNTER — Other Ambulatory Visit: Payer: Self-pay

## 2017-12-15 DIAGNOSIS — Z8673 Personal history of transient ischemic attack (TIA), and cerebral infarction without residual deficits: Secondary | ICD-10-CM | POA: Diagnosis not present

## 2017-12-15 DIAGNOSIS — R569 Unspecified convulsions: Secondary | ICD-10-CM | POA: Diagnosis not present

## 2017-12-15 DIAGNOSIS — I6319 Cerebral infarction due to embolism of other precerebral artery: Secondary | ICD-10-CM | POA: Diagnosis not present

## 2017-12-15 DIAGNOSIS — E7800 Pure hypercholesterolemia, unspecified: Secondary | ICD-10-CM

## 2017-12-15 DIAGNOSIS — Z95810 Presence of automatic (implantable) cardiac defibrillator: Secondary | ICD-10-CM | POA: Diagnosis not present

## 2017-12-15 LAB — POCT INR: INR: 2.4 — AB (ref <=1.1)

## 2017-12-16 ENCOUNTER — Telehealth: Payer: Self-pay | Admitting: Family Medicine

## 2017-12-16 ENCOUNTER — Ambulatory Visit: Payer: Self-pay | Admitting: *Deleted

## 2017-12-16 DIAGNOSIS — Z86718 Personal history of other venous thrombosis and embolism: Secondary | ICD-10-CM

## 2017-12-16 DIAGNOSIS — Z86711 Personal history of pulmonary embolism: Secondary | ICD-10-CM

## 2017-12-16 NOTE — Telephone Encounter (Signed)
Handled in an anticoag encounter

## 2017-12-16 NOTE — Progress Notes (Signed)
VM from [place] w/ [facility] INR 2.4 PT 29.4

## 2017-12-16 NOTE — Progress Notes (Signed)
Subjective:     Indication: DVT and PE Bleeding signs/symptoms: None Thromboembolic signs/symptoms: None  Missed Coumadin doses: None Medication changes: no Dietary changes: no Bacterial/viral infection: no Other concerns: Home health will end in about 2 weeks. Wife says that he can come in to the office for INRs after that.   The following portions of the patient's history were reviewed and updated as appropriate: allergies and current medications.  Review of Systems Pertinent items are noted in HPI.   Objective:    INR Today: 2.4  Current dose: 4mg  on Sun and Thurs and 3 mg all other days    Assessment:    Therapeutic INR for goal of 2-3   Plan:    1. New dose: no change   2. Next INR: 1 week    Travis Sicilian, RN

## 2017-12-23 ENCOUNTER — Ambulatory Visit (INDEPENDENT_AMBULATORY_CARE_PROVIDER_SITE_OTHER): Payer: BLUE CROSS/BLUE SHIELD | Admitting: *Deleted

## 2017-12-23 DIAGNOSIS — Z86711 Personal history of pulmonary embolism: Secondary | ICD-10-CM

## 2017-12-23 DIAGNOSIS — Z86718 Personal history of other venous thrombosis and embolism: Secondary | ICD-10-CM

## 2017-12-23 LAB — POCT INR: INR: 2.8 — AB (ref 0.9–1.1)

## 2017-12-23 NOTE — Progress Notes (Signed)
Subjective:     Indication: DVT and PE Bleeding signs/symptoms: None Thromboembolic signs/symptoms: None  Missed Coumadin doses: None Medication changes: no Dietary changes: no Bacterial/viral infection: no Other concerns: Discharged from home health today. Will need to to come in to the office for INR checks in the future.   The following portions of the patient's history were reviewed and updated as appropriate: allergies and current medications.  Review of Systems Pertinent items are noted in HPI.   Objective:    INR Today: 2.8 Current dose: 4mg  on Sun and Thurs and 3mg  all other days    Assessment:    Therapeutic INR for goal of 2-3   Plan:    1. New dose: no change   2. Next INR: 1 month    Anderson Malta with [facility] is going to notify the patient and his wife to continue the same dose. I will call and schedule him an appointment in 1 month.   Chong Sicilian, RN

## 2017-12-30 ENCOUNTER — Encounter: Payer: Self-pay | Admitting: Family Medicine

## 2017-12-30 DIAGNOSIS — Z0289 Encounter for other administrative examinations: Secondary | ICD-10-CM

## 2017-12-31 ENCOUNTER — Ambulatory Visit (INDEPENDENT_AMBULATORY_CARE_PROVIDER_SITE_OTHER): Payer: BLUE CROSS/BLUE SHIELD | Admitting: *Deleted

## 2017-12-31 DIAGNOSIS — I255 Ischemic cardiomyopathy: Secondary | ICD-10-CM | POA: Diagnosis not present

## 2017-12-31 MED ORDER — METOPROLOL TARTRATE 25 MG PO TABS: 37.5000 mg | ORAL_TABLET | Freq: Two times a day (BID) | ORAL | 3 refills | Status: DC

## 2018-01-01 NOTE — Progress Notes (Signed)
Remote ICD transmission.   

## 2018-01-04 ENCOUNTER — Encounter: Payer: Self-pay | Admitting: Cardiology

## 2018-01-09 DIAGNOSIS — J449 Chronic obstructive pulmonary disease, unspecified: Secondary | ICD-10-CM | POA: Diagnosis not present

## 2018-01-15 LAB — CUP PACEART REMOTE DEVICE CHECK
Battery Remaining Longevity: 59 mo
Battery Remaining Percentage: 57 %
Battery Voltage: 2.95 V
Brady Statistic RV Percent Paced: 1 %
Date Time Interrogation Session: 20190103070017
HighPow Impedance: 87 Ohm
HighPow Impedance: 87 Ohm
Implantable Lead Implant Date: 20140205
Implantable Lead Location: 753860
Implantable Lead Model: 181
Implantable Lead Serial Number: 323859
Implantable Pulse Generator Implant Date: 20140205
Lead Channel Impedance Value: 410 Ohm
Lead Channel Pacing Threshold Amplitude: 0.75 V
Lead Channel Pacing Threshold Pulse Width: 0.5 ms
Lead Channel Sensing Intrinsic Amplitude: 10.8 mV
Lead Channel Setting Pacing Amplitude: 2.5 V
Lead Channel Setting Pacing Pulse Width: 0.5 ms
Lead Channel Setting Sensing Sensitivity: 0.5 mV
Pulse Gen Serial Number: 7040910

## 2018-01-19 DIAGNOSIS — G4733 Obstructive sleep apnea (adult) (pediatric): Secondary | ICD-10-CM | POA: Diagnosis not present

## 2018-01-21 ENCOUNTER — Other Ambulatory Visit: Payer: Self-pay

## 2018-01-22 ENCOUNTER — Ambulatory Visit (INDEPENDENT_AMBULATORY_CARE_PROVIDER_SITE_OTHER): Payer: BLUE CROSS/BLUE SHIELD | Admitting: Pharmacist Clinician (PhC)/ Clinical Pharmacy Specialist

## 2018-01-22 DIAGNOSIS — Z86718 Personal history of other venous thrombosis and embolism: Secondary | ICD-10-CM

## 2018-01-22 DIAGNOSIS — Z86711 Personal history of pulmonary embolism: Secondary | ICD-10-CM | POA: Diagnosis not present

## 2018-01-22 LAB — COAGUCHEK XS/INR WAIVED
INR: 2.7 — ABNORMAL HIGH (ref 0.9–1.1)
Prothrombin Time: 32.3 s

## 2018-01-22 MED ORDER — WARFARIN SODIUM 2 MG PO TABS: 2.0000 mg | ORAL_TABLET | Freq: Every day | ORAL | 0 refills | Status: DC

## 2018-01-22 NOTE — Patient Instructions (Signed)
Description   Continue 4mg  on Sundays and 3mg  all other days  INR today is 2.7   Goal is 2.0-3.0

## 2018-01-28 ENCOUNTER — Ambulatory Visit (INDEPENDENT_AMBULATORY_CARE_PROVIDER_SITE_OTHER): Payer: BLUE CROSS/BLUE SHIELD

## 2018-01-28 DIAGNOSIS — G4700 Insomnia, unspecified: Secondary | ICD-10-CM | POA: Diagnosis not present

## 2018-01-28 DIAGNOSIS — S72142D Displaced intertrochanteric fracture of left femur, subsequent encounter for closed fracture with routine healing: Secondary | ICD-10-CM | POA: Diagnosis not present

## 2018-01-28 DIAGNOSIS — I252 Old myocardial infarction: Secondary | ICD-10-CM

## 2018-01-28 DIAGNOSIS — I110 Hypertensive heart disease with heart failure: Secondary | ICD-10-CM | POA: Diagnosis not present

## 2018-01-28 DIAGNOSIS — C44310 Basal cell carcinoma of skin of unspecified parts of face: Secondary | ICD-10-CM

## 2018-01-28 DIAGNOSIS — M6281 Muscle weakness (generalized): Secondary | ICD-10-CM | POA: Diagnosis not present

## 2018-01-28 DIAGNOSIS — I82409 Acute embolism and thrombosis of unspecified deep veins of unspecified lower extremity: Secondary | ICD-10-CM | POA: Diagnosis not present

## 2018-01-28 DIAGNOSIS — I2510 Atherosclerotic heart disease of native coronary artery without angina pectoris: Secondary | ICD-10-CM | POA: Diagnosis not present

## 2018-01-28 DIAGNOSIS — L02222 Furuncle of back [any part, except buttock]: Secondary | ICD-10-CM | POA: Diagnosis not present

## 2018-01-28 DIAGNOSIS — E785 Hyperlipidemia, unspecified: Secondary | ICD-10-CM

## 2018-01-28 DIAGNOSIS — R339 Retention of urine, unspecified: Secondary | ICD-10-CM

## 2018-01-28 DIAGNOSIS — I5031 Acute diastolic (congestive) heart failure: Secondary | ICD-10-CM | POA: Diagnosis not present

## 2018-01-28 DIAGNOSIS — J449 Chronic obstructive pulmonary disease, unspecified: Secondary | ICD-10-CM | POA: Diagnosis not present

## 2018-01-28 DIAGNOSIS — G4733 Obstructive sleep apnea (adult) (pediatric): Secondary | ICD-10-CM

## 2018-01-28 DIAGNOSIS — R41841 Cognitive communication deficit: Secondary | ICD-10-CM

## 2018-02-02 DIAGNOSIS — R569 Unspecified convulsions: Secondary | ICD-10-CM | POA: Diagnosis not present

## 2018-02-03 DIAGNOSIS — G4733 Obstructive sleep apnea (adult) (pediatric): Secondary | ICD-10-CM | POA: Diagnosis not present

## 2018-02-09 DIAGNOSIS — J449 Chronic obstructive pulmonary disease, unspecified: Secondary | ICD-10-CM | POA: Diagnosis not present

## 2018-02-16 DIAGNOSIS — S72142D Displaced intertrochanteric fracture of left femur, subsequent encounter for closed fracture with routine healing: Secondary | ICD-10-CM | POA: Diagnosis not present

## 2018-02-18 ENCOUNTER — Other Ambulatory Visit: Payer: Self-pay | Admitting: Family Medicine

## 2018-03-03 DIAGNOSIS — G4733 Obstructive sleep apnea (adult) (pediatric): Secondary | ICD-10-CM | POA: Diagnosis not present

## 2018-03-05 ENCOUNTER — Encounter: Payer: Self-pay | Admitting: Pharmacist Clinician (PhC)/ Clinical Pharmacy Specialist

## 2018-03-05 ENCOUNTER — Ambulatory Visit (INDEPENDENT_AMBULATORY_CARE_PROVIDER_SITE_OTHER): Payer: BLUE CROSS/BLUE SHIELD | Admitting: Family Medicine

## 2018-03-05 ENCOUNTER — Encounter: Payer: Self-pay | Admitting: Family Medicine

## 2018-03-05 VITALS — BP 115/80 | HR 87 | Temp 96.9°F | Ht 69.0 in | Wt 220.2 lb

## 2018-03-05 DIAGNOSIS — N401 Enlarged prostate with lower urinary tract symptoms: Secondary | ICD-10-CM

## 2018-03-05 DIAGNOSIS — E785 Hyperlipidemia, unspecified: Secondary | ICD-10-CM

## 2018-03-05 DIAGNOSIS — I824Z9 Acute embolism and thrombosis of unspecified deep veins of unspecified distal lower extremity: Secondary | ICD-10-CM

## 2018-03-05 DIAGNOSIS — R351 Nocturia: Secondary | ICD-10-CM | POA: Diagnosis not present

## 2018-03-05 LAB — COAGUCHEK XS/INR WAIVED
INR: 2.9 — ABNORMAL HIGH (ref 0.9–1.1)
Prothrombin Time: 34.8 s

## 2018-03-05 MED ORDER — TAMSULOSIN HCL 0.4 MG PO CAPS: 0.8000 mg | ORAL_CAPSULE | Freq: Every day | ORAL | 5 refills | Status: DC

## 2018-03-05 NOTE — Progress Notes (Signed)
   HPI  Patient presents today here for follow-up chronic medical conditions.  Patient has history of DVT, PE, considered to be a hypercoagulable state. He is tolerating Coumadin well No bleeding No diet changes.  Hyperlipidemia Taking Lipitor Monday, Wednesday, Friday Good medication tolerance and compliance.  BPH Patient is having 3 episodes of nocturia per night, also some urinary hesitancy and weak stream. Already taking Flomax 0.4 mg plus Proscar.  PMH: Smoking status noted ROS: Per HPI  Objective: BP 115/80   Pulse 87   Temp (!) 96.9 F (36.1 C) (Oral)   Ht '5\' 9"'$  (1.753 m)   Wt 220 lb 3.2 oz (99.9 kg)   BMI 32.52 kg/m  Gen: NAD, alert, cooperative with exam HEENT: NCAT CV: RRR, good S1/S2, no murmur Resp: CTABL, no wheezes, non-labored Ext: No edema, warm Neuro: Alert and oriented, No gross deficits  Assessment and plan:  #BPH Titrate Flomax to 0.8 mg Discussed with urology, has follow-up in about 1 month   #Hyperlipidemia Has restarted Lipitor recently CMP, lipid panel  #DVT, history of PE INR is therapeutic today No changes Repeat INR in 6 weeks     Orders Placed This Encounter  Procedures  . CoaguChek XS/INR Waived  . CMP14+EGFR  . Lipid panel  . PSA    Meds ordered this encounter  Medications  . tamsulosin (FLOMAX) 0.4 MG CAPS capsule    Sig: Take 2 capsules (0.8 mg total) by mouth daily.    Dispense:  60 capsule    Refill:  Barlow, MD [place] 03/05/2018, 8:51 AM

## 2018-03-05 NOTE — Patient Instructions (Signed)
Great to see you!  INR Check in 6 weeks  Follow up with me in 3 months

## 2018-03-06 LAB — CMP14+EGFR
ALT: 18 IU/L (ref 0–44)
AST: 21 IU/L (ref 0–40)
Albumin/Globulin Ratio: 1.1 — ABNORMAL LOW (ref 1.2–2.2)
Albumin: 4 g/dL (ref 3.6–4.8)
Alkaline Phosphatase: 184 IU/L — ABNORMAL HIGH (ref 39–117)
BUN/Creatinine Ratio: 15 (ref 10–24)
BUN: 16 mg/dL (ref 8–27)
Bilirubin Total: 0.6 mg/dL (ref 0.0–1.2)
CO2: 27 mmol/L (ref 20–29)
Calcium: 9.7 mg/dL (ref 8.6–10.2)
Chloride: 100 mmol/L (ref 96–106)
Creatinine, Ser: 1.08 mg/dL (ref 0.76–1.27)
GFR calc Af Amer: 81 mL/min/{1.73_m2} (ref >59)
GFR calc non Af Amer: 70 mL/min/{1.73_m2} (ref >59)
Globulin, Total: 3.7 g/dL (ref 1.5–4.5)
Glucose: 103 mg/dL — ABNORMAL HIGH (ref 65–99)
Potassium: 4.9 mmol/L (ref 3.5–5.2)
Sodium: 144 mmol/L (ref 134–144)
Total Protein: 7.7 g/dL (ref 6.0–8.5)

## 2018-03-06 LAB — LIPID PANEL
Chol/HDL Ratio: 3.9 ratio (ref 0.0–5.0)
Cholesterol, Total: 199 mg/dL (ref 100–199)
HDL: 51 mg/dL (ref >39)
LDL Calculated: 113 mg/dL — ABNORMAL HIGH (ref 0–99)
Triglycerides: 175 mg/dL — ABNORMAL HIGH (ref 0–149)
VLDL Cholesterol Cal: 35 mg/dL (ref 5–40)

## 2018-03-06 LAB — PSA: Prostate Specific Ag, Serum: 1.3 ng/mL (ref 0.0–4.0)

## 2018-03-08 ENCOUNTER — Other Ambulatory Visit: Payer: Self-pay | Admitting: *Deleted

## 2018-03-08 MED ORDER — TIOTROPIUM BROMIDE MONOHYDRATE 18 MCG IN CAPS: ORAL_CAPSULE | RESPIRATORY_TRACT | 0 refills | Status: DC

## 2018-03-09 DIAGNOSIS — J449 Chronic obstructive pulmonary disease, unspecified: Secondary | ICD-10-CM | POA: Diagnosis not present

## 2018-03-16 DIAGNOSIS — R569 Unspecified convulsions: Secondary | ICD-10-CM | POA: Diagnosis not present

## 2018-04-01 ENCOUNTER — Ambulatory Visit (INDEPENDENT_AMBULATORY_CARE_PROVIDER_SITE_OTHER): Payer: BLUE CROSS/BLUE SHIELD | Admitting: *Deleted

## 2018-04-01 DIAGNOSIS — I255 Ischemic cardiomyopathy: Secondary | ICD-10-CM | POA: Diagnosis not present

## 2018-04-01 NOTE — Progress Notes (Signed)
Remote ICD transmission.   

## 2018-04-03 DIAGNOSIS — G4733 Obstructive sleep apnea (adult) (pediatric): Secondary | ICD-10-CM | POA: Diagnosis not present

## 2018-04-05 ENCOUNTER — Other Ambulatory Visit: Payer: Self-pay | Admitting: Family Medicine

## 2018-04-07 ENCOUNTER — Encounter: Payer: Self-pay | Admitting: Cardiology

## 2018-04-09 ENCOUNTER — Ambulatory Visit (INDEPENDENT_AMBULATORY_CARE_PROVIDER_SITE_OTHER): Payer: BLUE CROSS/BLUE SHIELD | Admitting: Pharmacist Clinician (PhC)/ Clinical Pharmacy Specialist

## 2018-04-09 DIAGNOSIS — Z86711 Personal history of pulmonary embolism: Secondary | ICD-10-CM

## 2018-04-09 DIAGNOSIS — Z86718 Personal history of other venous thrombosis and embolism: Secondary | ICD-10-CM

## 2018-04-09 DIAGNOSIS — J449 Chronic obstructive pulmonary disease, unspecified: Secondary | ICD-10-CM | POA: Diagnosis not present

## 2018-04-09 LAB — COAGUCHEK XS/INR WAIVED
INR: 3.2 — ABNORMAL HIGH (ref 0.9–1.1)
Prothrombin Time: 38.1 s

## 2018-04-09 NOTE — Patient Instructions (Signed)
Description   No warfarin today.  Continue 4mg  on Sundays and 3mg  all other days  INR today is 3.2  Goal is 2.0-3.0

## 2018-04-14 ENCOUNTER — Ambulatory Visit (INDEPENDENT_AMBULATORY_CARE_PROVIDER_SITE_OTHER): Payer: BLUE CROSS/BLUE SHIELD | Admitting: Urology

## 2018-04-14 DIAGNOSIS — R351 Nocturia: Secondary | ICD-10-CM | POA: Diagnosis not present

## 2018-04-14 DIAGNOSIS — R339 Retention of urine, unspecified: Secondary | ICD-10-CM | POA: Diagnosis not present

## 2018-04-14 DIAGNOSIS — N401 Enlarged prostate with lower urinary tract symptoms: Secondary | ICD-10-CM

## 2018-04-20 ENCOUNTER — Other Ambulatory Visit: Payer: Self-pay | Admitting: Family Medicine

## 2018-04-22 LAB — CUP PACEART REMOTE DEVICE CHECK
Battery Remaining Longevity: 56 mo
Battery Remaining Percentage: 55 %
Battery Voltage: 2.93 V
Brady Statistic RV Percent Paced: 1 %
Date Time Interrogation Session: 20190404060026
HighPow Impedance: 88 Ohm
HighPow Impedance: 88 Ohm
Implantable Lead Implant Date: 20140205
Implantable Lead Location: 753860
Implantable Lead Model: 181
Implantable Lead Serial Number: 323859
Implantable Pulse Generator Implant Date: 20140205
Lead Channel Impedance Value: 410 Ohm
Lead Channel Pacing Threshold Amplitude: 0.75 V
Lead Channel Pacing Threshold Pulse Width: 0.5 ms
Lead Channel Sensing Intrinsic Amplitude: 10.6 mV
Lead Channel Setting Pacing Amplitude: 2.5 V
Lead Channel Setting Pacing Pulse Width: 0.5 ms
Lead Channel Setting Sensing Sensitivity: 0.5 mV
Pulse Gen Serial Number: 7040910

## 2018-05-03 DIAGNOSIS — G4733 Obstructive sleep apnea (adult) (pediatric): Secondary | ICD-10-CM | POA: Diagnosis not present

## 2018-05-04 DIAGNOSIS — J449 Chronic obstructive pulmonary disease, unspecified: Secondary | ICD-10-CM | POA: Diagnosis not present

## 2018-05-06 DIAGNOSIS — Z08 Encounter for follow-up examination after completed treatment for malignant neoplasm: Secondary | ICD-10-CM | POA: Diagnosis not present

## 2018-05-06 DIAGNOSIS — Z85828 Personal history of other malignant neoplasm of skin: Secondary | ICD-10-CM | POA: Diagnosis not present

## 2018-05-06 DIAGNOSIS — C44319 Basal cell carcinoma of skin of other parts of face: Secondary | ICD-10-CM | POA: Diagnosis not present

## 2018-05-14 ENCOUNTER — Ambulatory Visit: Payer: BLUE CROSS/BLUE SHIELD | Admitting: Pharmacist Clinician (PhC)/ Clinical Pharmacy Specialist

## 2018-05-14 DIAGNOSIS — Z86718 Personal history of other venous thrombosis and embolism: Secondary | ICD-10-CM

## 2018-05-14 DIAGNOSIS — Z86711 Personal history of pulmonary embolism: Secondary | ICD-10-CM

## 2018-05-14 LAB — COAGUCHEK XS/INR WAIVED
INR: 2.4 — ABNORMAL HIGH (ref 0.9–1.1)
Prothrombin Time: 28.4 s

## 2018-05-14 NOTE — Patient Instructions (Signed)
Description     Continue 4mg  on Sundays and 3mg  all other days  INR today is 2.4  Goal is 2.0-3.0

## 2018-05-20 ENCOUNTER — Other Ambulatory Visit: Payer: Self-pay | Admitting: Family Medicine

## 2018-05-26 ENCOUNTER — Ambulatory Visit (INDEPENDENT_AMBULATORY_CARE_PROVIDER_SITE_OTHER): Payer: BLUE CROSS/BLUE SHIELD | Admitting: Urology

## 2018-05-26 ENCOUNTER — Telehealth: Payer: Self-pay | Admitting: *Deleted

## 2018-05-26 DIAGNOSIS — R351 Nocturia: Secondary | ICD-10-CM

## 2018-05-26 DIAGNOSIS — R339 Retention of urine, unspecified: Secondary | ICD-10-CM | POA: Diagnosis not present

## 2018-05-26 DIAGNOSIS — N401 Enlarged prostate with lower urinary tract symptoms: Secondary | ICD-10-CM | POA: Diagnosis not present

## 2018-05-26 NOTE — Telephone Encounter (Signed)
CALLED PATIENT TO INFORM THAT SCAN HASN'T BEEN PRECERTED YET, AS SOON AS SCAN IS PRECERTED IT WILL BE SCHEDULED THEN HIS FU WILL BE RESCHEDULED, SPOKE WITH PATIENT'S WIFE [name] AND SHE IS AWARE OF THIS

## 2018-05-27 ENCOUNTER — Ambulatory Visit: Admission: RE | Admit: 2018-05-27 | Payer: BLUE CROSS/BLUE SHIELD | Source: Ambulatory Visit | Admitting: Urology

## 2018-05-27 DIAGNOSIS — G4733 Obstructive sleep apnea (adult) (pediatric): Secondary | ICD-10-CM | POA: Diagnosis not present

## 2018-05-28 ENCOUNTER — Ambulatory Visit: Payer: Self-pay | Admitting: Urology

## 2018-06-01 ENCOUNTER — Other Ambulatory Visit: Payer: Self-pay | Admitting: Family Medicine

## 2018-06-03 DIAGNOSIS — G4733 Obstructive sleep apnea (adult) (pediatric): Secondary | ICD-10-CM | POA: Diagnosis not present

## 2018-06-04 DIAGNOSIS — J449 Chronic obstructive pulmonary disease, unspecified: Secondary | ICD-10-CM | POA: Diagnosis not present

## 2018-06-07 DIAGNOSIS — Z85828 Personal history of other malignant neoplasm of skin: Secondary | ICD-10-CM | POA: Diagnosis not present

## 2018-06-07 DIAGNOSIS — Z08 Encounter for follow-up examination after completed treatment for malignant neoplasm: Secondary | ICD-10-CM | POA: Diagnosis not present

## 2018-06-08 ENCOUNTER — Encounter: Payer: Self-pay | Admitting: Family Medicine

## 2018-06-08 ENCOUNTER — Ambulatory Visit (INDEPENDENT_AMBULATORY_CARE_PROVIDER_SITE_OTHER): Payer: BLUE CROSS/BLUE SHIELD | Admitting: Family Medicine

## 2018-06-08 VITALS — BP 119/78 | HR 77 | Temp 96.9°F | Ht 69.0 in | Wt 224.6 lb

## 2018-06-08 DIAGNOSIS — Z86718 Personal history of other venous thrombosis and embolism: Secondary | ICD-10-CM

## 2018-06-08 DIAGNOSIS — I10 Essential (primary) hypertension: Secondary | ICD-10-CM | POA: Diagnosis not present

## 2018-06-08 DIAGNOSIS — Z86711 Personal history of pulmonary embolism: Secondary | ICD-10-CM | POA: Diagnosis not present

## 2018-06-08 DIAGNOSIS — R413 Other amnesia: Secondary | ICD-10-CM | POA: Diagnosis not present

## 2018-06-08 DIAGNOSIS — I824Z9 Acute embolism and thrombosis of unspecified deep veins of unspecified distal lower extremity: Secondary | ICD-10-CM | POA: Diagnosis not present

## 2018-06-08 LAB — COAGUCHEK XS/INR WAIVED
INR: 3.4 — ABNORMAL HIGH (ref 0.9–1.1)
Prothrombin Time: 41.3 s

## 2018-06-08 NOTE — Patient Instructions (Addendum)
Great to see you!  Take 1 more lasix 6 hours after your normal pill for 3 days  Come back to see dr. Evette Doffing in 3 months

## 2018-06-08 NOTE — Progress Notes (Signed)
   HPI  Patient presents today for memory loss, hypertension, and chronic anticoagulation due to DVT/PE.  No bleeding, good medication compliance with Coumadin, can easily recount his regimen.  Memory loss Seems to be worse after his hip fracture about a year ago. Has been going on for about 1 year.  Hypertension Good medication compliance. No hypo-tension, no dizziness, no chest pain or headaches. Blood pressure at home is typically 830N and 40H systolic  PMH: Smoking status noted ROS: Per HPI  Objective: BP 119/78   Pulse 77   Temp (!) 96.9 F (36.1 C) (Oral)   Ht 5\' 9"  (1.753 m)   Wt 224 lb 9.6 oz (101.9 kg)   SpO2 92%   BMI 33.17 kg/m  Gen: NAD, alert, cooperative with exam HEENT: NCAT CV: RRR, good S1/S2, no murmur Resp: Nonlabored, oxygen in place via nasal cannula, crackles at bases bilaterally Ext: Trace to 1+ edema bilaterally, symmetric Neuro: Alert and oriented, No gross deficits  Assessment and plan:  #Chronic anticoagulation due to history of DVT and PE Slightly supratherapeutic today, no bleeding Current weekly dose is 22 mg, reduced to 21 mg, 1.5 tabs daily  #Memory loss MMSE is significantly declined today Previous CT scan showing multiple old infarcts Possible vascular dementia, they will discuss with their neurologist at next follow-up. No medications advised at this time  #Hypertension Well-controlled No changes Patient does have rales bilaterally with slight swelling and slight increase in weight above baseline Has good urinary output with 40 mg of Lasix, will increase to twice daily x3 days. -Appears slightly volume up today  Orders Placed This Encounter  Procedures  . CoaguChek XS/INR Carney Bern, MD [facility] 06/08/2018, 8:49 AM

## 2018-07-02 ENCOUNTER — Ambulatory Visit (INDEPENDENT_AMBULATORY_CARE_PROVIDER_SITE_OTHER): Payer: BLUE CROSS/BLUE SHIELD | Admitting: *Deleted

## 2018-07-02 DIAGNOSIS — I472 Ventricular tachycardia, unspecified: Secondary | ICD-10-CM

## 2018-07-02 DIAGNOSIS — I255 Ischemic cardiomyopathy: Secondary | ICD-10-CM

## 2018-07-02 NOTE — Progress Notes (Signed)
Remote ICD transmission.   

## 2018-07-03 ENCOUNTER — Other Ambulatory Visit: Payer: Self-pay | Admitting: Family Medicine

## 2018-07-03 DIAGNOSIS — G4733 Obstructive sleep apnea (adult) (pediatric): Secondary | ICD-10-CM | POA: Diagnosis not present

## 2018-07-04 DIAGNOSIS — J449 Chronic obstructive pulmonary disease, unspecified: Secondary | ICD-10-CM | POA: Diagnosis not present

## 2018-07-09 ENCOUNTER — Ambulatory Visit (INDEPENDENT_AMBULATORY_CARE_PROVIDER_SITE_OTHER): Payer: BLUE CROSS/BLUE SHIELD | Admitting: Pharmacist Clinician (PhC)/ Clinical Pharmacy Specialist

## 2018-07-09 DIAGNOSIS — Z86711 Personal history of pulmonary embolism: Secondary | ICD-10-CM

## 2018-07-09 DIAGNOSIS — Z86718 Personal history of other venous thrombosis and embolism: Secondary | ICD-10-CM | POA: Diagnosis not present

## 2018-07-09 LAB — COAGUCHEK XS/INR WAIVED
INR: 4.4 — ABNORMAL HIGH (ref 0.9–1.1)
Prothrombin Time: 52.5 s

## 2018-07-09 NOTE — Patient Instructions (Signed)
Description   No warfarin today and tomorrow.  Then take 1 tablet Mondays, Wednesdays, and Fridays and 1 1/2 tablets all other days of the week.  INR today is 4.4,   your Goal is 2.0-3.0.  INR is thin today

## 2018-07-16 ENCOUNTER — Telehealth: Payer: Self-pay | Admitting: *Deleted

## 2018-07-16 ENCOUNTER — Ambulatory Visit (INDEPENDENT_AMBULATORY_CARE_PROVIDER_SITE_OTHER): Payer: BLUE CROSS/BLUE SHIELD | Admitting: Pharmacist Clinician (PhC)/ Clinical Pharmacy Specialist

## 2018-07-16 DIAGNOSIS — Z86718 Personal history of other venous thrombosis and embolism: Secondary | ICD-10-CM | POA: Diagnosis not present

## 2018-07-16 DIAGNOSIS — Z86711 Personal history of pulmonary embolism: Secondary | ICD-10-CM

## 2018-07-16 LAB — COAGUCHEK XS/INR WAIVED
INR: 2 — ABNORMAL HIGH (ref 0.9–1.1)
Prothrombin Time: 23.9 s

## 2018-07-16 NOTE — Patient Instructions (Signed)
Description   Change to taking 1 1/2 tablets every day except for Mondays and Fridays take 1 tablet.  INR today is 2.0   your Goal is 2.0-3.0.

## 2018-07-16 NOTE — Telephone Encounter (Signed)
CALLED PATIENT TO INFORM OF LABS AND TEST FOR 08-19-18- 11:45 AM FOR STAT LABS @ [place] AND HIS CT ON 08-19-18 - ARRIVAL TIME - 12:45 PM @ [facility], PT. TO HAVE WATER ONLY STARTING @ 9 AM, SPOKE WITH PATIENT'S WIFE - [name] AND SHE IS AWARE OF THIESE APPTS.

## 2018-07-21 ENCOUNTER — Ambulatory Visit (INDEPENDENT_AMBULATORY_CARE_PROVIDER_SITE_OTHER): Payer: BLUE CROSS/BLUE SHIELD | Admitting: Urology

## 2018-07-21 DIAGNOSIS — N401 Enlarged prostate with lower urinary tract symptoms: Secondary | ICD-10-CM

## 2018-07-21 DIAGNOSIS — R339 Retention of urine, unspecified: Secondary | ICD-10-CM | POA: Diagnosis not present

## 2018-07-21 DIAGNOSIS — R351 Nocturia: Secondary | ICD-10-CM | POA: Diagnosis not present

## 2018-07-23 ENCOUNTER — Encounter: Payer: Self-pay | Admitting: Internal Medicine

## 2018-07-23 ENCOUNTER — Ambulatory Visit (INDEPENDENT_AMBULATORY_CARE_PROVIDER_SITE_OTHER): Payer: BLUE CROSS/BLUE SHIELD | Admitting: Internal Medicine

## 2018-07-23 VITALS — BP 120/76 | HR 99 | Ht 69.0 in | Wt 222.2 lb

## 2018-07-23 DIAGNOSIS — I255 Ischemic cardiomyopathy: Secondary | ICD-10-CM

## 2018-07-23 DIAGNOSIS — I2510 Atherosclerotic heart disease of native coronary artery without angina pectoris: Secondary | ICD-10-CM

## 2018-07-23 DIAGNOSIS — Z95810 Presence of automatic (implantable) cardiac defibrillator: Secondary | ICD-10-CM

## 2018-07-23 DIAGNOSIS — I428 Other cardiomyopathies: Secondary | ICD-10-CM | POA: Diagnosis not present

## 2018-07-23 DIAGNOSIS — I472 Ventricular tachycardia, unspecified: Secondary | ICD-10-CM

## 2018-07-23 LAB — CUP PACEART INCLINIC DEVICE CHECK
Battery Remaining Longevity: 54 mo
Brady Statistic RV Percent Paced: 0 %
Date Time Interrogation Session: 20190726154608
HighPow Impedance: 90 Ohm
Implantable Lead Implant Date: 20140205
Implantable Lead Location: 753860
Implantable Lead Model: 181
Implantable Lead Serial Number: 323859
Implantable Pulse Generator Implant Date: 20140205
Lead Channel Impedance Value: 437.5 Ohm
Lead Channel Pacing Threshold Amplitude: 0.75 V
Lead Channel Pacing Threshold Amplitude: 0.75 V
Lead Channel Pacing Threshold Pulse Width: 0.5 ms
Lead Channel Pacing Threshold Pulse Width: 0.5 ms
Lead Channel Sensing Intrinsic Amplitude: 11.8 mV
Lead Channel Setting Pacing Amplitude: 2.5 V
Lead Channel Setting Pacing Pulse Width: 0.5 ms
Lead Channel Setting Sensing Sensitivity: 0.5 mV
Pulse Gen Serial Number: 7040910
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
## 2018-07-23 NOTE — Patient Instructions (Addendum)
Medication Instructions:  Your physician recommends that you continue on your current medications as directed. Please refer to the Current Medication list given to you today.  Labwork: Your physician recommends that you return for lab work today: CBC  Testing/Procedures: None ordered.  Follow-Up: Your physician recommends that you schedule a follow-up appointment in: 1 year with Dr Caryl Comes  Remote monitoring is used to monitor your Pacemaker of ICD from home. This monitoring reduces the number of office visits required to check your device to one time per year. It allows [place] to keep an eye on the functioning of your device to ensure it is working properly. You are scheduled for a device check from home on 10/7. You may send your transmission at any time that day. If you have a wireless device, the transmission will be sent automatically. After your physician reviews your transmission, you will receive a postcard with your next transmission date.    Any Other Special Instructions Will Be Listed Below (If Applicable).     If you need a refill on your cardiac medications before your next appointment, please call your pharmacy.

## 2018-07-24 LAB — CBC
Hematocrit: 53.1 % — ABNORMAL HIGH (ref 37.5–51.0)
Hemoglobin: 18.1 g/dL — ABNORMAL HIGH (ref 13.0–17.7)
MCH: 30.5 pg (ref 26.6–33.0)
MCHC: 34.1 g/dL (ref 31.5–35.7)
MCV: 90 fL (ref 79–97)
RBC: 5.93 x10E6/uL — ABNORMAL HIGH (ref 4.14–5.80)
RDW: 14.8 % (ref 12.3–15.4)
WBC: 14.3 10*3/uL — ABNORMAL HIGH (ref 3.4–10.8)

## 2018-07-28 ENCOUNTER — Telehealth: Payer: Self-pay | Admitting: Internal Medicine

## 2018-07-28 NOTE — Telephone Encounter (Signed)
Spoke with wife today who states she saw Dr Olin Pia OV notes stating pt was to increase his lasix to 80mg  qd x 5 days. Pt's wife states she had not started this yet and would he still need to do this. I advised her since it's only been a few days since his OV, I believe he should start the increase in his lasix. Pt's wife advised to call us if pt does not see any improvement. She verbalized understanding and had no additional questions.

## 2018-07-28 NOTE — Telephone Encounter (Signed)
New Message:      Pt c/o medication issue:  1. Name of Medication: Lasix  2. How are you currently taking this medication (dosage and times per day)? TAKE 1 TABLET BY MOUTH EVERY DAY  3. Are you having a reaction (difficulty breathing--STAT)? No  4. What is your medication issue? Pt's wife is calling and states that on the AVS it states for the pt to increase this medication. Pt's wife states neither one of them remember this being spoken of at this appt.

## 2018-07-29 LAB — CUP PACEART REMOTE DEVICE CHECK
Battery Remaining Longevity: 55 mo
Battery Remaining Percentage: 53 %
Battery Voltage: 2.93 V
Brady Statistic RV Percent Paced: 1 %
Date Time Interrogation Session: 20190704060020
HighPow Impedance: 91 Ohm
HighPow Impedance: 91 Ohm
Implantable Lead Implant Date: 20140205
Implantable Lead Location: 753860
Implantable Lead Model: 181
Implantable Lead Serial Number: 323859
Implantable Pulse Generator Implant Date: 20140205
Lead Channel Impedance Value: 430 Ohm
Lead Channel Pacing Threshold Amplitude: 0.75 V
Lead Channel Pacing Threshold Pulse Width: 0.5 ms
Lead Channel Sensing Intrinsic Amplitude: 10.6 mV
Lead Channel Setting Pacing Amplitude: 2.5 V
Lead Channel Setting Pacing Pulse Width: 0.5 ms
Lead Channel Setting Sensing Sensitivity: 0.5 mV
Pulse Gen Serial Number: 7040910

## 2018-08-04 DIAGNOSIS — J449 Chronic obstructive pulmonary disease, unspecified: Secondary | ICD-10-CM | POA: Diagnosis not present

## 2018-08-09 ENCOUNTER — Other Ambulatory Visit: Payer: Self-pay

## 2018-08-09 MED ORDER — ATORVASTATIN CALCIUM 80 MG PO TABS: ORAL_TABLET | ORAL | 0 refills | Status: DC

## 2018-08-13 ENCOUNTER — Telehealth: Payer: Self-pay | Admitting: Family Medicine

## 2018-08-13 NOTE — Telephone Encounter (Signed)
Patient needed earlier appointment on Friday with [facility]. Appointment scheduled

## 2018-08-13 NOTE — Telephone Encounter (Signed)
PT wife is calling on behalf of Travis Cruz is having dental procedure next week and dr hill wont do it unless pt INR is at certain spot, she was wanting to speak to [facility] but she isnt here

## 2018-08-18 ENCOUNTER — Other Ambulatory Visit: Payer: Self-pay | Admitting: *Deleted

## 2018-08-18 MED ORDER — FUROSEMIDE 40 MG PO TABS: 40.0000 mg | ORAL_TABLET | Freq: Every day | ORAL | 0 refills | Status: DC

## 2018-08-19 ENCOUNTER — Ambulatory Visit (HOSPITAL_COMMUNITY)
Admission: RE | Admit: 2018-08-19 | Discharge: 2018-08-19 | Disposition: A | Discharge status: Home / Self Care | Payer: BLUE CROSS/BLUE SHIELD | Source: Ambulatory Visit | Attending: Urology | Admitting: Urology

## 2018-08-19 ENCOUNTER — Ambulatory Visit
Admission: RE | Admit: 2018-08-19 | Discharge: 2018-08-19 | Disposition: A | Discharge status: Home / Self Care | Payer: BLUE CROSS/BLUE SHIELD | Source: Ambulatory Visit | Attending: Urology | Admitting: Urology

## 2018-08-19 ENCOUNTER — Other Ambulatory Visit: Payer: Self-pay | Admitting: *Deleted

## 2018-08-19 DIAGNOSIS — I700 Atherosclerosis of aorta: Secondary | ICD-10-CM | POA: Insufficient documentation

## 2018-08-19 DIAGNOSIS — C3431 Malignant neoplasm of lower lobe, right bronchus or lung: Secondary | ICD-10-CM

## 2018-08-19 DIAGNOSIS — K8020 Calculus of gallbladder without cholecystitis without obstruction: Secondary | ICD-10-CM | POA: Insufficient documentation

## 2018-08-19 DIAGNOSIS — J439 Emphysema, unspecified: Secondary | ICD-10-CM | POA: Diagnosis not present

## 2018-08-19 DIAGNOSIS — I2510 Atherosclerotic heart disease of native coronary artery without angina pectoris: Secondary | ICD-10-CM | POA: Diagnosis not present

## 2018-08-19 DIAGNOSIS — C3490 Malignant neoplasm of unspecified part of unspecified bronchus or lung: Secondary | ICD-10-CM | POA: Diagnosis not present

## 2018-08-19 LAB — BUN & CREATININE (CHCC)
BUN: 15 mg/dL (ref 8–23)
Creatinine: 0.97 mg/dL (ref 0.61–1.24)
GFR, Est AFR Am: >60 mL/min (ref >60)
GFR, Estimated: >60 mL/min (ref >60)

## 2018-08-19 MED ORDER — IOHEXOL 300 MG/ML  SOLN
75.0000 mL | Freq: Once | INTRAMUSCULAR | Status: AC | PRN
  Administered 2018-08-19: 75 mL via INTRAVENOUS

## 2018-08-20 ENCOUNTER — Encounter: Payer: Self-pay | Admitting: Pharmacist Clinician (PhC)/ Clinical Pharmacy Specialist

## 2018-08-20 ENCOUNTER — Ambulatory Visit (INDEPENDENT_AMBULATORY_CARE_PROVIDER_SITE_OTHER): Payer: BLUE CROSS/BLUE SHIELD | Admitting: Pharmacist Clinician (PhC)/ Clinical Pharmacy Specialist

## 2018-08-20 DIAGNOSIS — Z86711 Personal history of pulmonary embolism: Secondary | ICD-10-CM | POA: Diagnosis not present

## 2018-08-20 DIAGNOSIS — Z86718 Personal history of other venous thrombosis and embolism: Secondary | ICD-10-CM | POA: Diagnosis not present

## 2018-08-20 LAB — COAGUCHEK XS/INR WAIVED
INR: 3.1 — ABNORMAL HIGH (ref 0.9–1.1)
Prothrombin Time: 37.3 s

## 2018-08-20 NOTE — Patient Instructions (Signed)
Description   Continue taking warfarin the same way as on calendar  INR today is 3.1    your Goal is 2.0-3.0.

## 2018-09-04 DIAGNOSIS — J449 Chronic obstructive pulmonary disease, unspecified: Secondary | ICD-10-CM | POA: Diagnosis not present

## 2018-09-07 ENCOUNTER — Other Ambulatory Visit: Payer: Self-pay | Admitting: *Deleted

## 2018-09-07 MED ORDER — TIOTROPIUM BROMIDE MONOHYDRATE 18 MCG IN CAPS: ORAL_CAPSULE | RESPIRATORY_TRACT | 0 refills | Status: DC

## 2018-09-07 NOTE — Telephone Encounter (Signed)
Ov 09/20/18

## 2018-09-13 ENCOUNTER — Telehealth: Payer: Self-pay | Admitting: *Deleted

## 2018-09-13 MED ORDER — WARFARIN SODIUM 2 MG PO TABS: 2.0000 mg | ORAL_TABLET | Freq: Every day | ORAL | 0 refills | Status: DC

## 2018-09-13 NOTE — Telephone Encounter (Signed)
Pt has seen the PharmD recently & will see Dr. Evette Doffing on 09/22/18 Refill sent to pharmacy, pt aware

## 2018-09-22 ENCOUNTER — Encounter: Payer: Self-pay | Admitting: Pediatrics

## 2018-09-22 ENCOUNTER — Ambulatory Visit (INDEPENDENT_AMBULATORY_CARE_PROVIDER_SITE_OTHER): Payer: BLUE CROSS/BLUE SHIELD | Admitting: Pediatrics

## 2018-09-22 VITALS — BP 109/68 | HR 91 | Temp 97.0°F | Ht 69.0 in | Wt 222.0 lb

## 2018-09-22 DIAGNOSIS — Z23 Encounter for immunization: Secondary | ICD-10-CM

## 2018-09-22 DIAGNOSIS — Z0000 Encounter for general adult medical examination without abnormal findings: Secondary | ICD-10-CM | POA: Diagnosis not present

## 2018-09-22 DIAGNOSIS — Z86711 Personal history of pulmonary embolism: Secondary | ICD-10-CM

## 2018-09-22 DIAGNOSIS — Z86718 Personal history of other venous thrombosis and embolism: Secondary | ICD-10-CM

## 2018-09-22 LAB — COAGUCHEK XS/INR WAIVED
INR: 1.7 — ABNORMAL HIGH (ref 0.9–1.1)
Prothrombin Time: 20.4 s

## 2018-09-22 NOTE — Progress Notes (Signed)
  Subjective:   Patient ID: Travis Cruz, male    DOB: 1946-08-31, 72 y.o.   MRN: [id number] CC: Coagulation Disorder and prev exam HPI: Travis Cruz is a 72 y.o. male with complicated PMH including ischemic cardiomyopathy, COPD, OSA, h/o PE on chronic anticoagulation, CVA, and chronic hypoxic resp failure on oxygen.  Chronic anticoagulation: h/o PE. Taking warfarin regularly, not missed any doses, no bleeding that he has noticed.  COPD: takes spiriva regularly, not having frequent exacerbations  BPH: following with urology, on finasteride, flomax, mirabegron.  Relevant past medical, surgical, family and social history reviewed. Allergies and medications reviewed and updated. Social History   Tobacco Use  Smoking Status Former Smoker  . Packs/day: 1.30  . Years: 40.00  . Pack years: 52.00  . Types: Cigarettes  . Last attempt to quit: 07/22/2008  . Years since quitting: 10.1  Smokeless Tobacco Former Systems developer  . Types: Chew   ROS: All systems neg other than what is in the HPI  Objective:    BP 109/68   Pulse 91   Temp (!) 97 F (36.1 C) (Oral)   Ht 5' 9" (1.753 m)   Wt 222 lb (100.7 kg)   BMI 32.78 kg/m   Wt Readings from Last 3 Encounters:  09/22/18 222 lb (100.7 kg)  07/23/18 222 lb 3.2 oz (100.8 kg)  06/08/18 224 lb 9.6 oz (101.9 kg)    Gen: NAD, alert, cooperative with exam, NCAT EYES: EOMI, no conjunctival injection, or no icterus ENT:  OP without erythema LYMPH: no cervical LAD CV: NRRR, normal S1/S2, no murmur, distal pulses 2+ b/l Resp: moving air well, no wheezes, normal WOB Abd: +BS, soft, NTND.  Ext: No pitting edema, warm Neuro: Alert and oriented  Assessment & Plan:  Travis Cruz was seen today for coagulation disorder.  Diagnoses and all orders for this visit:  Encounter for preventive care -     BMP8+EGFR -     Hepatitis C antibody  History of DVT (deep vein thrombosis) INR low, slightly increased dose, see separate anticoag tracking,  now 56m warfarin daily except M. -     CoaguChek XS/INR Waived  History of pulmonary embolus (PE) -     CoaguChek XS/INR Waived  Encounter for immunization -     Flu vaccine HIGH DOSE PF   Follow up plan: Return for 2 weeks to see MSharyn Lullfor next INR. Med prob f/u in 3 mo CAssunta Found MD [place]

## 2018-09-23 LAB — BMP8+EGFR
BUN/Creatinine Ratio: 14 (ref 10–24)
BUN: 15 mg/dL (ref 8–27)
CO2: 29 mmol/L (ref 20–29)
Calcium: 10.1 mg/dL (ref 8.6–10.2)
Chloride: 98 mmol/L (ref 96–106)
Creatinine, Ser: 1.04 mg/dL (ref 0.76–1.27)
GFR calc Af Amer: 84 mL/min/{1.73_m2} (ref >59)
GFR calc non Af Amer: 72 mL/min/{1.73_m2} (ref >59)
Glucose: 111 mg/dL — ABNORMAL HIGH (ref 65–99)
Potassium: 4.7 mmol/L (ref 3.5–5.2)
Sodium: 143 mmol/L (ref 134–144)

## 2018-09-23 LAB — HEPATITIS C ANTIBODY: Hep C Virus Ab: <0.1 s/co ratio (ref 0.0–0.9)

## 2018-10-01 ENCOUNTER — Other Ambulatory Visit: Payer: Self-pay | Admitting: Pediatrics

## 2018-10-04 ENCOUNTER — Other Ambulatory Visit: Payer: Self-pay | Admitting: *Deleted

## 2018-10-04 ENCOUNTER — Ambulatory Visit (INDEPENDENT_AMBULATORY_CARE_PROVIDER_SITE_OTHER): Payer: BLUE CROSS/BLUE SHIELD | Admitting: *Deleted

## 2018-10-04 DIAGNOSIS — I255 Ischemic cardiomyopathy: Secondary | ICD-10-CM

## 2018-10-04 DIAGNOSIS — I428 Other cardiomyopathies: Secondary | ICD-10-CM

## 2018-10-04 DIAGNOSIS — J449 Chronic obstructive pulmonary disease, unspecified: Secondary | ICD-10-CM | POA: Diagnosis not present

## 2018-10-04 MED ORDER — TAMSULOSIN HCL 0.4 MG PO CAPS: 0.8000 mg | ORAL_CAPSULE | Freq: Every day | ORAL | 0 refills | Status: DC

## 2018-10-04 NOTE — Progress Notes (Signed)
Remote ICD transmission.   

## 2018-10-05 DIAGNOSIS — G4733 Obstructive sleep apnea (adult) (pediatric): Secondary | ICD-10-CM | POA: Diagnosis not present

## 2018-10-06 ENCOUNTER — Ambulatory Visit: Payer: BLUE CROSS/BLUE SHIELD | Admitting: Pharmacist Clinician (PhC)/ Clinical Pharmacy Specialist

## 2018-10-06 DIAGNOSIS — Z86718 Personal history of other venous thrombosis and embolism: Secondary | ICD-10-CM

## 2018-10-06 DIAGNOSIS — Z86711 Personal history of pulmonary embolism: Secondary | ICD-10-CM

## 2018-10-06 LAB — COAGUCHEK XS/INR WAIVED
INR: 3.7 — ABNORMAL HIGH (ref 0.9–1.1)
Prothrombin Time: 44.6 s

## 2018-10-06 NOTE — Patient Instructions (Signed)
Description   Take 1.5 tabs every day except Mondays and Fridays 1 tablet.  Check liver labs next visit  INR today is 3.7    your Goal is 2.0-3.0.  Too thin today

## 2018-10-13 ENCOUNTER — Encounter: Payer: Self-pay | Admitting: Cardiology

## 2018-11-03 ENCOUNTER — Ambulatory Visit (INDEPENDENT_AMBULATORY_CARE_PROVIDER_SITE_OTHER): Payer: BLUE CROSS/BLUE SHIELD | Admitting: Pharmacist Clinician (PhC)/ Clinical Pharmacy Specialist

## 2018-11-03 DIAGNOSIS — Z86711 Personal history of pulmonary embolism: Secondary | ICD-10-CM

## 2018-11-03 DIAGNOSIS — Z86718 Personal history of other venous thrombosis and embolism: Secondary | ICD-10-CM | POA: Diagnosis not present

## 2018-11-03 DIAGNOSIS — R945 Abnormal results of liver function studies: Secondary | ICD-10-CM

## 2018-11-03 DIAGNOSIS — R7989 Other specified abnormal findings of blood chemistry: Secondary | ICD-10-CM

## 2018-11-03 LAB — COAGUCHEK XS/INR WAIVED
INR: 2.5 — ABNORMAL HIGH (ref 0.9–1.1)
Prothrombin Time: 30.1 s

## 2018-11-03 NOTE — Patient Instructions (Signed)
Description   Continue taking 1 1/2 tablets every day except for Mondays and Fridays take 1 tablet  INR today is 2.5 (goal is 2-3)  Perfect reading today!

## 2018-11-04 DIAGNOSIS — J449 Chronic obstructive pulmonary disease, unspecified: Secondary | ICD-10-CM | POA: Diagnosis not present

## 2018-11-04 LAB — ALKALINE PHOSPHATASE: Alkaline Phosphatase: 138 IU/L — ABNORMAL HIGH (ref 39–117)

## 2018-11-05 LAB — SPECIMEN STATUS REPORT

## 2018-11-05 LAB — GAMMA GT: GGT: 21 IU/L (ref 0–65)

## 2018-11-10 ENCOUNTER — Other Ambulatory Visit: Payer: Self-pay | Admitting: Family Medicine

## 2018-11-16 LAB — CUP PACEART REMOTE DEVICE CHECK
Battery Remaining Longevity: 52 mo
Battery Remaining Percentage: 51 %
Battery Voltage: 2.93 V
Brady Statistic RV Percent Paced: 1 %
Date Time Interrogation Session: 20191003060016
HighPow Impedance: 88 Ohm
HighPow Impedance: 88 Ohm
Implantable Lead Implant Date: 20140205
Implantable Lead Location: 753860
Implantable Lead Model: 181
Implantable Lead Serial Number: 323859
Implantable Pulse Generator Implant Date: 20140205
Lead Channel Impedance Value: 440 Ohm
Lead Channel Pacing Threshold Amplitude: 0.75 V
Lead Channel Pacing Threshold Pulse Width: 0.5 ms
Lead Channel Sensing Intrinsic Amplitude: 11.2 mV
Lead Channel Setting Pacing Amplitude: 2.5 V
Lead Channel Setting Pacing Pulse Width: 0.5 ms
Lead Channel Setting Sensing Sensitivity: 0.5 mV
Pulse Gen Serial Number: 7040910

## 2018-11-23 ENCOUNTER — Other Ambulatory Visit: Payer: Self-pay | Admitting: Pediatrics

## 2018-12-02 ENCOUNTER — Other Ambulatory Visit: Payer: Self-pay | Admitting: Urology

## 2018-12-02 DIAGNOSIS — C3412 Malignant neoplasm of upper lobe, left bronchus or lung: Secondary | ICD-10-CM

## 2018-12-03 ENCOUNTER — Ambulatory Visit (INDEPENDENT_AMBULATORY_CARE_PROVIDER_SITE_OTHER): Payer: BLUE CROSS/BLUE SHIELD | Admitting: Family Medicine

## 2018-12-03 ENCOUNTER — Encounter: Payer: Self-pay | Admitting: Family Medicine

## 2018-12-03 VITALS — BP 132/83 | HR 114 | Temp 96.8°F | Ht 69.0 in | Wt 225.0 lb

## 2018-12-03 DIAGNOSIS — M545 Low back pain, unspecified: Secondary | ICD-10-CM

## 2018-12-03 DIAGNOSIS — R300 Dysuria: Secondary | ICD-10-CM | POA: Diagnosis not present

## 2018-12-03 LAB — URINALYSIS
Bilirubin, UA: NEGATIVE
Glucose, UA: NEGATIVE
Leukocytes, UA: NEGATIVE
Nitrite, UA: NEGATIVE
RBC, UA: NEGATIVE
Specific Gravity, UA: >1.03 — ABNORMAL HIGH (ref 1.005–1.030)
Urobilinogen, Ur: 1 mg/dL (ref 0.2–1.0)
pH, UA: 5.5 (ref 5.0–7.5)

## 2018-12-03 MED ORDER — PREDNISONE 10 MG PO TABS: ORAL_TABLET | ORAL | 0 refills | Status: DC

## 2018-12-03 MED ORDER — CIPROFLOXACIN HCL 500 MG PO TABS: 500.0000 mg | ORAL_TABLET | Freq: Two times a day (BID) | ORAL | 0 refills | Status: DC
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
## 2018-12-04 ENCOUNTER — Encounter: Payer: Self-pay | Admitting: Family Medicine

## 2018-12-04 DIAGNOSIS — J449 Chronic obstructive pulmonary disease, unspecified: Secondary | ICD-10-CM | POA: Diagnosis not present

## 2018-12-04 LAB — CBC WITH DIFFERENTIAL/PLATELET
Basophils Absolute: 0.1 10*3/uL (ref 0.0–0.2)
Basos: 1 %
EOS (ABSOLUTE): 0.2 10*3/uL (ref 0.0–0.4)
Eos: 2 %
Hematocrit: 51.6 % — ABNORMAL HIGH (ref 37.5–51.0)
Hemoglobin: 17.3 g/dL (ref 13.0–17.7)
Immature Grans (Abs): 0.1 10*3/uL (ref 0.0–0.1)
Immature Granulocytes: 1 %
Lymphocytes Absolute: 3.9 10*3/uL — ABNORMAL HIGH (ref 0.7–3.1)
Lymphs: 25 %
MCH: 27.9 pg (ref 26.6–33.0)
MCHC: 33.5 g/dL (ref 31.5–35.7)
MCV: 83 fL (ref 79–97)
Monocytes Absolute: 1.8 10*3/uL — ABNORMAL HIGH (ref 0.1–0.9)
Monocytes: 12 %
Neutrophils Absolute: 9.2 10*3/uL — ABNORMAL HIGH (ref 1.4–7.0)
Neutrophils: 59 %
Platelets: 160 10*3/uL (ref 150–450)
RBC: 6.21 x10E6/uL — ABNORMAL HIGH (ref 4.14–5.80)
RDW: 14 % (ref 12.3–15.4)
WBC: 15.3 10*3/uL — ABNORMAL HIGH (ref 3.4–10.8)

## 2018-12-04 LAB — CMP14+EGFR
ALT: 18 IU/L (ref 0–44)
AST: 22 IU/L (ref 0–40)
Albumin/Globulin Ratio: 1.1 — ABNORMAL LOW (ref 1.2–2.2)
Albumin: 4 g/dL (ref 3.5–4.8)
Alkaline Phosphatase: 167 IU/L — ABNORMAL HIGH (ref 39–117)
BUN/Creatinine Ratio: 13 (ref 10–24)
BUN: 15 mg/dL (ref 8–27)
Bilirubin Total: 0.6 mg/dL (ref 0.0–1.2)
CO2: 28 mmol/L (ref 20–29)
Calcium: 9.9 mg/dL (ref 8.6–10.2)
Chloride: 97 mmol/L (ref 96–106)
Creatinine, Ser: 1.15 mg/dL (ref 0.76–1.27)
GFR calc Af Amer: 74 mL/min/{1.73_m2} (ref >59)
GFR calc non Af Amer: 64 mL/min/{1.73_m2} (ref >59)
Globulin, Total: 3.5 g/dL (ref 1.5–4.5)
Glucose: 98 mg/dL (ref 65–99)
Potassium: 5.1 mmol/L (ref 3.5–5.2)
Sodium: 143 mmol/L (ref 134–144)
Total Protein: 7.5 g/dL (ref 6.0–8.5)

## 2018-12-05 LAB — URINE CULTURE

## 2018-12-10 ENCOUNTER — Ambulatory Visit (INDEPENDENT_AMBULATORY_CARE_PROVIDER_SITE_OTHER): Payer: BLUE CROSS/BLUE SHIELD | Admitting: Family Medicine

## 2018-12-10 ENCOUNTER — Ambulatory Visit (INDEPENDENT_AMBULATORY_CARE_PROVIDER_SITE_OTHER): Payer: BLUE CROSS/BLUE SHIELD

## 2018-12-10 VITALS — BP 138/84 | HR 114 | Temp 96.9°F | Ht 69.0 in | Wt 225.0 lb

## 2018-12-10 DIAGNOSIS — G3281 Cerebellar ataxia in diseases classified elsewhere: Secondary | ICD-10-CM | POA: Diagnosis not present

## 2018-12-10 DIAGNOSIS — M47816 Spondylosis without myelopathy or radiculopathy, lumbar region: Secondary | ICD-10-CM | POA: Diagnosis not present

## 2018-12-10 DIAGNOSIS — R195 Other fecal abnormalities: Secondary | ICD-10-CM | POA: Diagnosis not present

## 2018-12-10 DIAGNOSIS — M545 Low back pain, unspecified: Secondary | ICD-10-CM

## 2018-12-10 LAB — MICROSCOPIC EXAMINATION
Bacteria, UA: NONE SEEN
RBC, UA: NONE SEEN /hpf (ref 0–2)
Renal Epithel, UA: NONE SEEN /hpf
WBC, UA: NONE SEEN /hpf (ref 0–5)

## 2018-12-10 LAB — COAGUCHEK XS/INR WAIVED
INR: 4.4 — ABNORMAL HIGH (ref 0.9–1.1)
Prothrombin Time: 53.1 s

## 2018-12-10 LAB — URINALYSIS, COMPLETE
Bilirubin, UA: NEGATIVE
Glucose, UA: NEGATIVE
Ketones, UA: NEGATIVE
Leukocytes, UA: NEGATIVE
Nitrite, UA: NEGATIVE
Protein, UA: NEGATIVE
RBC, UA: NEGATIVE
Specific Gravity, UA: >1.03 — ABNORMAL HIGH (ref 1.005–1.030)
Urobilinogen, Ur: 0.2 mg/dL (ref 0.2–1.0)
pH, UA: 5 (ref 5.0–7.5)

## 2018-12-10 MED ORDER — TRAMADOL HCL 50 MG PO TABS: 50.0000 mg | ORAL_TABLET | Freq: Four times a day (QID) | ORAL | 0 refills | Status: AC | PRN
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
## 2018-12-11 LAB — CMP14+EGFR
ALT: 18 IU/L (ref 0–44)
AST: 23 IU/L (ref 0–40)
Albumin/Globulin Ratio: 1.3 (ref 1.2–2.2)
Albumin: 3.9 g/dL (ref 3.5–4.8)
Alkaline Phosphatase: 145 IU/L — ABNORMAL HIGH (ref 39–117)
BUN/Creatinine Ratio: 18 (ref 10–24)
BUN: 23 mg/dL (ref 8–27)
Bilirubin Total: 0.5 mg/dL (ref 0.0–1.2)
CO2: 27 mmol/L (ref 20–29)
Calcium: 9.9 mg/dL (ref 8.6–10.2)
Chloride: 94 mmol/L — ABNORMAL LOW (ref 96–106)
Creatinine, Ser: 1.28 mg/dL — ABNORMAL HIGH (ref 0.76–1.27)
GFR calc Af Amer: 65 mL/min/{1.73_m2} (ref >59)
GFR calc non Af Amer: 56 mL/min/{1.73_m2} — ABNORMAL LOW (ref >59)
Globulin, Total: 3.1 g/dL (ref 1.5–4.5)
Glucose: 193 mg/dL — ABNORMAL HIGH (ref 65–99)
Potassium: 4 mmol/L (ref 3.5–5.2)
Sodium: 142 mmol/L (ref 134–144)
Total Protein: 7 g/dL (ref 6.0–8.5)

## 2018-12-11 LAB — CBC WITH DIFFERENTIAL/PLATELET
Basophils Absolute: 0.1 10*3/uL (ref 0.0–0.2)
Basos: 0 %
EOS (ABSOLUTE): 0 10*3/uL (ref 0.0–0.4)
Eos: 0 %
Hematocrit: 54 % — ABNORMAL HIGH (ref 37.5–51.0)
Hemoglobin: 18.9 g/dL — ABNORMAL HIGH (ref 13.0–17.7)
Immature Grans (Abs): 0.2 10*3/uL — ABNORMAL HIGH (ref 0.0–0.1)
Immature Granulocytes: 1 %
Lymphocytes Absolute: 1.8 10*3/uL (ref 0.7–3.1)
Lymphs: 12 %
MCH: 29.7 pg (ref 26.6–33.0)
MCHC: 35 g/dL (ref 31.5–35.7)
MCV: 85 fL (ref 79–97)
Monocytes Absolute: 1.2 10*3/uL — ABNORMAL HIGH (ref 0.1–0.9)
Monocytes: 8 %
Neutrophils Absolute: 12.5 10*3/uL — ABNORMAL HIGH (ref 1.4–7.0)
Neutrophils: 79 %
Platelets: 293 10*3/uL (ref 150–450)
RBC: 6.37 x10E6/uL — ABNORMAL HIGH (ref 4.14–5.80)
RDW: 14.4 % (ref 12.3–15.4)
WBC: 15.8 10*3/uL — ABNORMAL HIGH (ref 3.4–10.8)

## 2018-12-11 LAB — LIPASE: Lipase: 14 U/L (ref 13–78)

## 2018-12-13 ENCOUNTER — Encounter: Payer: Self-pay | Admitting: Family Medicine

## 2018-12-15 ENCOUNTER — Encounter (INDEPENDENT_AMBULATORY_CARE_PROVIDER_SITE_OTHER): Payer: BLUE CROSS/BLUE SHIELD | Admitting: Ophthalmology

## 2018-12-15 ENCOUNTER — Ambulatory Visit: Payer: BLUE CROSS/BLUE SHIELD | Admitting: Pharmacist Clinician (PhC)/ Clinical Pharmacy Specialist

## 2018-12-15 DIAGNOSIS — Z86711 Personal history of pulmonary embolism: Secondary | ICD-10-CM

## 2018-12-15 DIAGNOSIS — Z86718 Personal history of other venous thrombosis and embolism: Secondary | ICD-10-CM

## 2018-12-15 LAB — COAGUCHEK XS/INR WAIVED
INR: 4.1 — ABNORMAL HIGH (ref 0.9–1.1)
Prothrombin Time: 49.1 s

## 2018-12-15 NOTE — Patient Instructions (Signed)
Description   No warfarin today and tomorrow.  Then go back to regular schedule.  INR 4.1 today (goal is 2-3)  Too think today

## 2018-12-20 ENCOUNTER — Ambulatory Visit (INDEPENDENT_AMBULATORY_CARE_PROVIDER_SITE_OTHER): Payer: BLUE CROSS/BLUE SHIELD | Admitting: Pharmacist Clinician (PhC)/ Clinical Pharmacy Specialist

## 2018-12-20 ENCOUNTER — Other Ambulatory Visit: Payer: Self-pay | Admitting: Pediatrics

## 2018-12-20 DIAGNOSIS — Z86718 Personal history of other venous thrombosis and embolism: Secondary | ICD-10-CM

## 2018-12-20 DIAGNOSIS — Z86711 Personal history of pulmonary embolism: Secondary | ICD-10-CM | POA: Diagnosis not present

## 2018-12-20 LAB — COAGUCHEK XS/INR WAIVED
INR: 3.2 — ABNORMAL HIGH (ref 0.9–1.1)
Prothrombin Time: 39 s

## 2018-12-20 NOTE — Telephone Encounter (Signed)
OV 12/27/18

## 2018-12-20 NOTE — Patient Instructions (Signed)
Description   Continue taking warfarin the same way.  Have a high vitamin K food today  INR 3.2  today (goal is 2-3)  Just a little thin today

## 2018-12-23 ENCOUNTER — Other Ambulatory Visit: Payer: Self-pay | Admitting: *Deleted

## 2018-12-23 MED ORDER — METOPROLOL TARTRATE 25 MG PO TABS: 37.5000 mg | ORAL_TABLET | Freq: Two times a day (BID) | ORAL | 0 refills | Status: DC

## 2018-12-23 NOTE — Telephone Encounter (Signed)
OV 12/27/18

## 2018-12-27 ENCOUNTER — Encounter: Payer: Self-pay | Admitting: Pediatrics

## 2018-12-27 ENCOUNTER — Ambulatory Visit (INDEPENDENT_AMBULATORY_CARE_PROVIDER_SITE_OTHER): Payer: BLUE CROSS/BLUE SHIELD | Admitting: Pediatrics

## 2018-12-27 ENCOUNTER — Ambulatory Visit (INDEPENDENT_AMBULATORY_CARE_PROVIDER_SITE_OTHER): Payer: BLUE CROSS/BLUE SHIELD

## 2018-12-27 VITALS — BP 123/81 | HR 80 | Temp 97.5°F | Resp 22 | Ht 69.0 in | Wt 215.6 lb

## 2018-12-27 DIAGNOSIS — M545 Low back pain, unspecified: Secondary | ICD-10-CM

## 2018-12-27 DIAGNOSIS — Z86718 Personal history of other venous thrombosis and embolism: Secondary | ICD-10-CM

## 2018-12-27 DIAGNOSIS — Z86711 Personal history of pulmonary embolism: Secondary | ICD-10-CM

## 2018-12-27 DIAGNOSIS — N400 Enlarged prostate without lower urinary tract symptoms: Secondary | ICD-10-CM

## 2018-12-27 DIAGNOSIS — Z8673 Personal history of transient ischemic attack (TIA), and cerebral infarction without residual deficits: Secondary | ICD-10-CM | POA: Diagnosis not present

## 2018-12-27 DIAGNOSIS — Z7901 Long term (current) use of anticoagulants: Secondary | ICD-10-CM

## 2018-12-27 DIAGNOSIS — M810 Age-related osteoporosis without current pathological fracture: Secondary | ICD-10-CM

## 2018-12-27 DIAGNOSIS — R609 Edema, unspecified: Secondary | ICD-10-CM

## 2018-12-27 DIAGNOSIS — M8589 Other specified disorders of bone density and structure, multiple sites: Secondary | ICD-10-CM | POA: Diagnosis not present

## 2018-12-27 DIAGNOSIS — E785 Hyperlipidemia, unspecified: Secondary | ICD-10-CM

## 2018-12-27 LAB — COAGUCHEK XS/INR WAIVED
INR: 5.1 (ref 0.9–1.1)
Prothrombin Time: 61 s

## 2018-12-27 MED ORDER — TAMSULOSIN HCL 0.4 MG PO CAPS: 0.8000 mg | ORAL_CAPSULE | Freq: Every day | ORAL | 0 refills | Status: DC

## 2018-12-27 MED ORDER — TRAMADOL HCL 50 MG PO TABS: 50.0000 mg | ORAL_TABLET | Freq: Three times a day (TID) | ORAL | 0 refills | Status: DC | PRN

## 2018-12-27 MED ORDER — FINASTERIDE 5 MG PO TABS: 5.0000 mg | ORAL_TABLET | Freq: Every day | ORAL | 3 refills | Status: DC

## 2018-12-27 MED ORDER — FUROSEMIDE 20 MG PO TABS: 20.0000 mg | ORAL_TABLET | Freq: Every day | ORAL | 1 refills | Status: DC

## 2018-12-27 MED ORDER — ATORVASTATIN CALCIUM 80 MG PO TABS: ORAL_TABLET | ORAL | 0 refills | Status: DC

## 2018-12-27 MED ORDER — WARFARIN SODIUM 2 MG PO TABS: 2.0000 mg | ORAL_TABLET | Freq: Every day | ORAL | 0 refills | Status: DC

## 2018-12-27 MED ORDER — FUROSEMIDE 20 MG PO TABS: 40.0000 mg | ORAL_TABLET | Freq: Every day | ORAL | 1 refills | Status: DC

## 2018-12-27 NOTE — Progress Notes (Signed)
Subjective:   Patient ID: Travis Cruz, male    DOB: [birth year], 72 y.o.   MRN: [id number] CC: Medical Management of Chronic Issues and Coagulation Disorder  HPI: Travis Cruz is a 72 y.o. male   Low back pain: bothers him most when changing position. Was started on tramadol 2 weeks ago. Taking 2-4 times a day with some improvement in symptoms. Also taking tylenol. Sometimes bothers him with walking. Uses cane when out of the house, a triangular walker within house for mobility.  Hip fracture: in 2018. Also with compression vertebral fractures on lumbar xray. Not currently on osteoporosis treatment  Chronic anticoagulaion: h/o dvt, PE. Recent course of antibiotics. INR elevated past couple of checks. Has not noticed any bleeding at home  Gets frustrated at times with communication, will say something but mean the opposite. Oriented to person, place, time of year, not date or year usually. Wife has noticed short term memory not as good, repeats questions often.   Breathing has been at baseline, on 3L O2 oxygen all the time.   BPH: following with urology, does feel like he is consistently emptying his bladder now, taking medicines regularly  Takes furosemide 40mg  daily. H/o R sided heart failure, following with cardiology.   Relevant past medical, surgical, family and social history reviewed. Allergies and medications reviewed and updated. Social History   Tobacco Use  Smoking Status Former Smoker  . Packs/day: 1.30  . Years: 40.00  . Pack years: 52.00  . Types: Cigarettes  . Last attempt to quit: 07/22/2008  . Years since quitting: 10.4  Smokeless Tobacco Former Systems developer  . Types: Chew   ROS: Per HPI   Objective:    BP 123/81   Pulse 80   Temp (!) 97.5 F (36.4 C) (Oral)   Resp (!) 22   Ht 5\' 9"  (1.753 m)   Wt 215 lb 9.6 oz (97.8 kg)   SpO2 93% Comment: 3L02  BMI 31.84 kg/m   Wt Readings from Last 3 Encounters:  12/27/18 215 lb 9.6 oz (97.8 kg)  12/10/18 225  lb (102.1 kg)  12/03/18 225 lb (102.1 kg)    Gen: NAD, alert, cooperative with exam, NCAT EYES: EOMI, no conjunctival injection, or no icterus CV: NRRR, normal S1/S2, no murmur Resp: diminished breath sounds b/l, no wheezes, slightly tachypneic, comfortable WOB,  Abd: +BS, soft, NTND.  Ext: No pitting edema, warm Neuro: Alert and oriented to person, place, season, says 2007 for the year MSK: normal muscle bulk  Assessment & Plan:  Boen was seen today for medical management of chronic issues and coagulation disorder.  Diagnoses and all orders for this visit:  History of DVT (deep vein thrombosis) INR 5.1, recheck 5.5. Hold until 1/2, will recheck INR 1/2 prior to restart. -     CoaguChek XS/INR Waived  History of TIA (transient ischemic attack) -     CoaguChek XS/INR Waived  Chronic anticoagulation -     CoaguChek XS/INR Waived  History of pulmonary embolus (PE) -     warfarin (COUMADIN) 2 MG tablet; Take 1 tablet (2 mg total) by mouth daily at 6 PM.  Osteoporosis, unspecified osteoporosis type, unspecified pathological fracture presence Not currently on treatment. Will get DEXA. -     DG [facility] DEXA  Acute low back pain without sciatica, unspecified back pain laterality Ongoing, mostly with position changes. Has upcoming CT scan for evaluation -     traMADol (ULTRAM) 50 MG tablet; Take 1 tablet (50 mg  total) by mouth every 8 (eight) hours as needed.  Hyperlipidemia, unspecified hyperlipidemia type Stable, cont below -     atorvastatin (LIPITOR) 80 MG tablet; TAKE 1 TABLET BY MOUTH THREE TIMES PER WEEK  Fluid retention R sided heart failure Recent increase in Cr over past few months. Will decrease to 20mg  lasix, needs close following of volume status -     furosemide (LASIX) 20 MG tablet;   Benign prostatic hyperplasia, unspecified whether lower urinary tract symptoms present Stable, cont below -     tamsulosin (FLOMAX) 0.4 MG CAPS capsule; Take 2 capsules (0.8 mg  total) by mouth daily. -     finasteride (PROSCAR) 5 MG tablet; Take 1 tablet (5 mg total) by mouth daily.   Follow up plan: F/u 3 days for INR Return in about 4 weeks (around 01/24/2019) to see me Assunta Found, MD [place]

## 2018-12-30 ENCOUNTER — Ambulatory Visit (INDEPENDENT_AMBULATORY_CARE_PROVIDER_SITE_OTHER): Payer: BLUE CROSS/BLUE SHIELD | Admitting: Family Medicine

## 2018-12-30 ENCOUNTER — Encounter: Payer: Self-pay | Admitting: Family Medicine

## 2018-12-30 VITALS — BP 128/82 | HR 88 | Temp 96.9°F | Ht 69.0 in | Wt 217.0 lb

## 2018-12-30 DIAGNOSIS — Z86711 Personal history of pulmonary embolism: Secondary | ICD-10-CM

## 2018-12-30 DIAGNOSIS — Z7901 Long term (current) use of anticoagulants: Secondary | ICD-10-CM | POA: Diagnosis not present

## 2018-12-30 DIAGNOSIS — Z86718 Personal history of other venous thrombosis and embolism: Secondary | ICD-10-CM | POA: Diagnosis not present

## 2018-12-30 LAB — COAGUCHEK XS/INR WAIVED
INR: 3.2 — ABNORMAL HIGH (ref 0.9–1.1)
Prothrombin Time: 37.8 s

## 2018-12-30 NOTE — Progress Notes (Signed)
BP 128/82   Pulse 88   Temp (!) 96.9 F (36.1 C) (Oral)   Ht 5\' 9"  (1.753 m)   Wt 217 lb (98.4 kg)   BMI 32.05 kg/m    Subjective:    Patient ID: Travis Cruz, male    DOB: December 17, 1946, 73 y.o.   MRN: [id number]  HPI: Travis Cruz is a 73 y.o. male presenting on 12/30/2018 for Coagulation Disorder   HPI History of DVT and PE and chronic anticoagulation Patient is coming in today for history of PE and DVT and chronic anticoagulation.  He was given an antibiotic and a steroid week and a half ago which made his last Coumadin check be 5.1-3 days ago and he has been holding his Coumadin since that time.  He denies any signs of bleeding or abnormal bruising.  His INR today is 3.2.  We will continue to hold tonight and have him start tomorrow with his normal dosing.  Relevant past medical, surgical, family and social history reviewed and updated as indicated. Interim medical history since our last visit reviewed. Allergies and medications reviewed and updated.  Review of Systems  Constitutional: Negative for chills and fever.  Respiratory: Negative for shortness of breath and wheezing.   Cardiovascular: Negative for chest pain and leg swelling.  Gastrointestinal: Negative for blood in stool.  Genitourinary: Negative for hematuria.  Musculoskeletal: Negative for back pain and gait problem.  Skin: Negative for rash.  All other systems reviewed and are negative.   Per HPI unless specifically indicated above   Allergies as of 12/30/2018      Reactions   Ativan [lorazepam] Anxiety, Other (See Comments)   Hyper  [name]      Medication List       Accurate as of December 30, 2018  8:21 AM. Always use your most recent med list.        atorvastatin 80 MG tablet Commonly known as:  LIPITOR TAKE 1 TABLET BY MOUTH THREE TIMES PER WEEK   finasteride 5 MG tablet Commonly known as:  PROSCAR Take 1 tablet (5 mg total) by mouth daily.   furosemide 20 MG tablet Commonly known as:  LASIX Take 1 tablet (20 mg total) by mouth daily.   metoprolol tartrate 25 MG tablet Commonly known as:  LOPRESSOR Take 1.5 tablets (37.5 mg total) by mouth 2 (two) times daily.   multivitamin tablet Take 1 tablet by mouth daily.   MYRBETRIQ 50 MG Tb24 tablet Generic drug:  mirabegron ER Take 50 mg by mouth daily.   PRESERVISION AREDS PO Take 1 capsule by mouth 2 (two) times daily.   tamsulosin 0.4 MG Caps capsule Commonly known as:  FLOMAX Take 2 capsules (0.8 mg total) by mouth daily.   tiotropium 18 MCG inhalation capsule Commonly known as:  SPIRIVA PLACE 1 CAPSULE INTO INHALER AND INHALE DAILY   traMADol 50 MG tablet Commonly known as:  ULTRAM Take 1 tablet (50 mg total) by mouth every 8 (eight) hours as needed.   Vitamin D3 50 MCG (2000 UT) Tabs Take 1 tablet by mouth daily.   warfarin 2 MG tablet Commonly known as:  COUMADIN Take as directed by the anticoagulation clinic. If you are unsure how to take this medication, talk to your nurse or doctor. Original instructions:  Take 1 tablet (2 mg total) by mouth daily at 6 PM.          Objective:  BP 128/82   Pulse 88   Temp (!) 96.9 F (36.1 C) (Oral)   Ht 5\' 9"  (1.753 m)   Wt 217 lb (98.4 kg)   BMI 32.05 kg/m   Wt Readings from Last 3 Encounters:  12/30/18 217 lb (98.4 kg)  12/27/18 215 lb 9.6 oz (97.8 kg)  12/10/18 225 lb (102.1 kg)    Physical Exam Vitals signs and nursing note reviewed.  Constitutional:      General: He is not in acute distress.    Appearance: He is well-developed. He is not diaphoretic.  Neck:     Thyroid: No thyromegaly.  Cardiovascular:     Rate and Rhythm: Normal rate and regular rhythm.     Heart sounds: Normal heart sounds. No murmur.  Pulmonary:     Effort: Pulmonary effort is normal. No respiratory distress.     Breath sounds: Normal breath sounds. No wheezing.     Comments: Patient on 2 L nasal cannula Musculoskeletal:  Normal range of motion.  Skin:    General: Skin is warm and dry.  Neurological:     Mental Status: He is alert.    Description   Hold warfarin today and then restart at normal dosing tomorrow and recheck in 1 week  INR 3.2  today (goal is 2-3)        .   Assessment & Plan:   Problem List Items Addressed This Visit      Other   History of DVT (deep vein thrombosis)   Relevant Orders   CoaguChek XS/INR Waived   History of pulmonary embolus (PE)    Other Visit Diagnoses    Chronic anticoagulation    -  Primary      Follow up plan: Return if symptoms worsen or fail to improve.  Counseling provided for all of the vaccine components Orders Placed This Encounter  Procedures  . CoaguChek XS/INR [place], MD [facility] 12/30/2018, 8:21 AM

## 2019-01-03 ENCOUNTER — Ambulatory Visit (INDEPENDENT_AMBULATORY_CARE_PROVIDER_SITE_OTHER): Payer: BLUE CROSS/BLUE SHIELD

## 2019-01-03 DIAGNOSIS — I255 Ischemic cardiomyopathy: Secondary | ICD-10-CM | POA: Diagnosis not present

## 2019-01-03 DIAGNOSIS — I428 Other cardiomyopathies: Secondary | ICD-10-CM

## 2019-01-04 DIAGNOSIS — J449 Chronic obstructive pulmonary disease, unspecified: Secondary | ICD-10-CM | POA: Diagnosis not present

## 2019-01-04 LAB — CUP PACEART REMOTE DEVICE CHECK
Battery Remaining Longevity: 50 mo
Battery Remaining Percentage: 49 %
Battery Voltage: 2.93 V
Brady Statistic RV Percent Paced: 1 %
Date Time Interrogation Session: 20200106090016
HighPow Impedance: 92 Ohm
HighPow Impedance: 92 Ohm
Implantable Lead Implant Date: 20140205
Implantable Lead Location: 753860
Implantable Lead Model: 181
Implantable Lead Serial Number: 323859
Implantable Pulse Generator Implant Date: 20140205
Lead Channel Impedance Value: 430 Ohm
Lead Channel Pacing Threshold Amplitude: 0.75 V
Lead Channel Pacing Threshold Pulse Width: 0.5 ms
Lead Channel Sensing Intrinsic Amplitude: 10.3 mV
Lead Channel Setting Pacing Amplitude: 2.5 V
Lead Channel Setting Pacing Pulse Width: 0.5 ms
Lead Channel Setting Sensing Sensitivity: 0.5 mV
Pulse Gen Serial Number: 7040910

## 2019-01-04 NOTE — Progress Notes (Signed)
Remote ICD transmission.   

## 2019-01-05 ENCOUNTER — Ambulatory Visit (INDEPENDENT_AMBULATORY_CARE_PROVIDER_SITE_OTHER): Payer: BLUE CROSS/BLUE SHIELD | Admitting: Pharmacist Clinician (PhC)/ Clinical Pharmacy Specialist

## 2019-01-05 ENCOUNTER — Ambulatory Visit (HOSPITAL_COMMUNITY)
Admission: RE | Admit: 2019-01-05 | Discharge: 2019-01-05 | Disposition: A | Discharge status: Home / Self Care | Payer: BLUE CROSS/BLUE SHIELD | Source: Ambulatory Visit | Attending: Family Medicine | Admitting: Family Medicine

## 2019-01-05 ENCOUNTER — Encounter (HOSPITAL_COMMUNITY): Payer: Self-pay

## 2019-01-05 DIAGNOSIS — M47816 Spondylosis without myelopathy or radiculopathy, lumbar region: Secondary | ICD-10-CM | POA: Diagnosis not present

## 2019-01-05 DIAGNOSIS — G3281 Cerebellar ataxia in diseases classified elsewhere: Secondary | ICD-10-CM | POA: Diagnosis not present

## 2019-01-05 DIAGNOSIS — M545 Low back pain, unspecified: Secondary | ICD-10-CM

## 2019-01-05 DIAGNOSIS — Z86711 Personal history of pulmonary embolism: Secondary | ICD-10-CM

## 2019-01-05 DIAGNOSIS — Z86718 Personal history of other venous thrombosis and embolism: Secondary | ICD-10-CM

## 2019-01-05 DIAGNOSIS — R4020 Unspecified coma: Secondary | ICD-10-CM | POA: Diagnosis not present

## 2019-01-05 LAB — COAGUCHEK XS/INR WAIVED
INR: 3.8 — ABNORMAL HIGH (ref 0.9–1.1)
Prothrombin Time: 46.1 s

## 2019-01-05 MED ORDER — IOHEXOL 300 MG/ML  SOLN
75.0000 mL | Freq: Once | INTRAMUSCULAR | Status: AC | PRN
  Administered 2019-01-05: 75 mL via INTRAVENOUS

## 2019-01-05 NOTE — Patient Instructions (Signed)
Description   Change to taking warfarin as follows:  No warfarin today then take 1 tablet a day except for Mondays and Fridays take 1 1/2 tablets.  INR 3.8  today (goal is 2-3)

## 2019-01-12 ENCOUNTER — Ambulatory Visit: Payer: BLUE CROSS/BLUE SHIELD | Admitting: Pharmacist Clinician (PhC)/ Clinical Pharmacy Specialist

## 2019-01-12 DIAGNOSIS — Z86711 Personal history of pulmonary embolism: Secondary | ICD-10-CM

## 2019-01-12 DIAGNOSIS — Z86718 Personal history of other venous thrombosis and embolism: Secondary | ICD-10-CM

## 2019-01-12 LAB — COAGUCHEK XS/INR WAIVED
INR: 3.8 — ABNORMAL HIGH (ref 0.9–1.1)
Prothrombin Time: 45.6 s

## 2019-01-12 MED ORDER — RISEDRONATE SODIUM 150 MG PO TABS: 150.0000 mg | ORAL_TABLET | ORAL | 3 refills | Status: DC

## 2019-01-12 NOTE — Progress Notes (Signed)
Since CT was normal will start Actonel 150mg  monthly due to patient's high risk for fractures, falls, FRAX score, and T-score.  Counseled patient and wife on appropriate use of Actonel and possible adverse effects.  Repeat dexascan in 2 years.

## 2019-01-12 NOTE — Patient Instructions (Signed)
Description   No warfarin today and tomorrow.  Then change to taking 1 tablet (2mg ) every day of the week.  INR 3.8  today (goal is 2-3)

## 2019-01-24 ENCOUNTER — Ambulatory Visit: Payer: BLUE CROSS/BLUE SHIELD | Admitting: Pediatrics

## 2019-01-26 ENCOUNTER — Ambulatory Visit (INDEPENDENT_AMBULATORY_CARE_PROVIDER_SITE_OTHER): Payer: BLUE CROSS/BLUE SHIELD | Admitting: Family Medicine

## 2019-01-26 ENCOUNTER — Encounter: Payer: Self-pay | Admitting: Family Medicine

## 2019-01-26 VITALS — BP 123/82 | HR 88 | Temp 96.6°F | Ht 69.0 in | Wt 214.6 lb

## 2019-01-26 DIAGNOSIS — Z86711 Personal history of pulmonary embolism: Secondary | ICD-10-CM | POA: Diagnosis not present

## 2019-01-26 DIAGNOSIS — M545 Low back pain, unspecified: Secondary | ICD-10-CM

## 2019-01-26 DIAGNOSIS — Z86718 Personal history of other venous thrombosis and embolism: Secondary | ICD-10-CM

## 2019-01-26 DIAGNOSIS — R29898 Other symptoms and signs involving the musculoskeletal system: Secondary | ICD-10-CM | POA: Diagnosis not present

## 2019-01-26 DIAGNOSIS — J441 Chronic obstructive pulmonary disease with (acute) exacerbation: Secondary | ICD-10-CM | POA: Diagnosis not present

## 2019-01-26 LAB — COAGUCHEK XS/INR WAIVED
INR: 2.3 — ABNORMAL HIGH (ref 0.9–1.1)
Prothrombin Time: 27.4 s

## 2019-01-26 MED ORDER — TRAMADOL HCL 50 MG PO TABS: 50.0000 mg | ORAL_TABLET | Freq: Three times a day (TID) | ORAL | 0 refills | Status: DC | PRN

## 2019-01-26 MED ORDER — WARFARIN SODIUM 2 MG PO TABS: 2.0000 mg | ORAL_TABLET | Freq: Every day | ORAL | 0 refills | Status: DC

## 2019-01-26 MED ORDER — AMOXICILLIN-POT CLAVULANATE 875-125 MG PO TABS: 1.0000 | ORAL_TABLET | Freq: Two times a day (BID) | ORAL | 0 refills | Status: DC

## 2019-01-26 NOTE — Progress Notes (Signed)
BP 123/82   Pulse 88   Temp (!) 96.6 F (35.9 C) (Oral)   Ht 5\' 9"  (1.753 m)   Wt 214 lb 9.6 oz (97.3 kg)   SpO2 93%   BMI 31.69 kg/m    Subjective:    Patient ID: Travis Cruz, male    DOB: 09/01/1946, 73 y.o.   MRN: [id number]  HPI: Travis Cruz is a 73 y.o. male presenting on 01/26/2019 for Coagulation Disorder (Patient states that he is having leg weakness in left side) and Cough (x 2 days)   HPI Anticoagulation recheck Patient is coming in for Coumadin and anticoagulation recheck.  He does have a history of pulmonary emboli and has been on his dose of Coumadin for some time.  He his last INR was elevated but today his INR is 2.3.  He is currently taking 2 mg every day.  He denies any abnormal bruising or bleeding.  Patient opens in today complaining of cough and chest congestion and rattling this been going on more over the past couple days.  Patient is on 2 L nasal cannula oxygen has been for some time.  Patient denies any fevers or chills or significantly more shortness of breath than what he normally has.  He did have one episode a few days ago where he was little short of breath after getting out of shower but has not had any since.  His cough is been mostly nonproductive at this point.  They have not used anything over-the-counter to help with this.  He is still taking his Spiriva but does not have an albuterol inhaler and does not feel like he is gotten to the point where he would need 1.  Patient comes in complaining of low back pain and bilateral lower extremity weakness but worse on left than right.  He says that he feels like his left leg gives out on him sometimes because of the weakness.  He did have a CT scan of his spine recently within the past couple months that showed chronic degeneration but no acute changes and possible nerve impingement due to mild to moderate foraminal stenosis at a couple levels but patient is unable to have an MRI to confirm this and  likely would not be a good surgical candidate we will try and manage this conservatively with physical therapy and see if we can get his strength back up.  His back pain has been improving and is only using the tramadol sparingly at this point.  Relevant past medical, surgical, family and social history reviewed and updated as indicated. Interim medical history since our last visit reviewed. Allergies and medications reviewed and updated.  Review of Systems  Constitutional: Negative for chills and fever.  Eyes: Negative for visual disturbance.  Respiratory: Negative for shortness of breath and wheezing.   Cardiovascular: Negative for chest pain and leg swelling.  Musculoskeletal: Positive for back pain. Negative for gait problem.  Skin: Negative for rash.  Neurological: Positive for weakness. Negative for dizziness, light-headedness, numbness and headaches.  All other systems reviewed and are negative.   Per HPI unless specifically indicated above  Allergies as of 01/26/2019      Reactions   Ativan [lorazepam] Anxiety, Other (See Comments)   Hyper  [name]      Medication List       Accurate as of January 26, 2019 10:12 AM. Always use your most recent med list.  amoxicillin-clavulanate 875-125 MG tablet Commonly known as:  AUGMENTIN Take 1 tablet by mouth 2 (two) times daily.   atorvastatin 80 MG tablet Commonly known as:  LIPITOR TAKE 1 TABLET BY MOUTH THREE TIMES PER WEEK   finasteride 5 MG tablet Commonly known as:  PROSCAR Take 1 tablet (5 mg total) by mouth daily.   furosemide 20 MG tablet Commonly known as:  LASIX Take 1 tablet (20 mg total) by mouth daily.   metoprolol tartrate 25 MG tablet Commonly known as:  LOPRESSOR Take 1.5 tablets (37.5 mg total) by mouth 2 (two) times daily.   multivitamin tablet Take 1 tablet by mouth daily.   MYRBETRIQ 50 MG Tb24 tablet Generic drug:  mirabegron ER Take 50 mg by mouth  daily.   PRESERVISION AREDS PO Take 1 capsule by mouth 2 (two) times daily.   risedronate 150 MG tablet Commonly known as:  ACTONEL Take 1 tablet (150 mg total) by mouth every 30 (thirty) days. with water on empty stomach, nothing by mouth or lie down for next 30 minutes.   tamsulosin 0.4 MG Caps capsule Commonly known as:  FLOMAX Take 2 capsules (0.8 mg total) by mouth daily.   tiotropium 18 MCG inhalation capsule Commonly known as:  SPIRIVA PLACE 1 CAPSULE INTO INHALER AND INHALE DAILY   traMADol 50 MG tablet Commonly known as:  ULTRAM Take 1 tablet (50 mg total) by mouth every 8 (eight) hours as needed.   Vitamin D3 50 MCG (2000 UT) Tabs Take 1 tablet by mouth daily.   warfarin 2 MG tablet Commonly known as:  COUMADIN Take as directed by the anticoagulation clinic. If you are unsure how to take this medication, talk to your nurse or doctor. Original instructions:  Take 1 tablet (2 mg total) by mouth daily at 6 PM.          Objective:    BP 123/82   Pulse 88   Temp (!) 96.6 F (35.9 C) (Oral)   Ht 5\' 9"  (1.753 m)   Wt 214 lb 9.6 oz (97.3 kg)   SpO2 93%   BMI 31.69 kg/m   Wt Readings from Last 3 Encounters:  01/26/19 214 lb 9.6 oz (97.3 kg)  12/30/18 217 lb (98.4 kg)  12/27/18 215 lb 9.6 oz (97.8 kg)    Physical Exam Vitals signs and nursing note reviewed.  Constitutional:      General: He is not in acute distress.    Appearance: He is well-developed. He is not diaphoretic.  Eyes:     General: No scleral icterus.    Conjunctiva/sclera: Conjunctivae normal.  Neck:     Musculoskeletal: Neck supple.     Thyroid: No thyromegaly.  Cardiovascular:     Rate and Rhythm: Normal rate and regular rhythm.     Heart sounds: Normal heart sounds. No murmur.  Pulmonary:     Effort: Pulmonary effort is normal. No respiratory distress.     Breath sounds: Rhonchi (Rhonchi and coarse breath sounds throughout) present. No wheezing or rales.  Musculoskeletal: Normal range  of motion.  Lymphadenopathy:     Cervical: No cervical adenopathy.  Skin:    General: Skin is warm and dry.     Findings: No rash.  Neurological:     Mental Status: He is alert and oriented to person, place, and time.     Coordination: Coordination normal.  Psychiatric:        Behavior: Behavior normal.     Description   Take  1/2 tablets / 1 mg every day while on the antibiotic and then return back to normal dosing when finished of 2 mg daily  INR 2.3  today (goal is 2-3)            Assessment & Plan:   Problem List Items Addressed This Visit      Other   History of DVT (deep vein thrombosis) - Primary   Relevant Orders   CoaguChek XS/INR Waived   History of pulmonary embolus (PE)   Relevant Medications   warfarin (COUMADIN) 2 MG tablet    Other Visit Diagnoses    COPD exacerbation (HCC)       Relevant Medications   amoxicillin-clavulanate (AUGMENTIN) 875-125 MG tablet   Weakness of left lower extremity       Possibly from nerve compression in the spine but patient is not a good surgical candidate, we will do physical therapy   Relevant Orders   Ambulatory referral to Physical Therapy   Acute low back pain without sciatica, unspecified back pain laterality       Relevant Medications   traMADol (ULTRAM) 50 MG tablet       Follow up plan: Return if symptoms worsen or fail to improve, for 2-week INR recheck with Sharyn Lull.  Counseling provided for all of the vaccine components Orders Placed This Encounter  Procedures  . CoaguChek XS/INR Waived  . Ambulatory referral to Physical Therapy    Caryl Pina, MD [facility] 01/26/2019, 10:12 AM

## 2019-01-31 DIAGNOSIS — G4733 Obstructive sleep apnea (adult) (pediatric): Secondary | ICD-10-CM | POA: Diagnosis not present

## 2019-02-01 ENCOUNTER — Other Ambulatory Visit: Payer: Self-pay

## 2019-02-01 ENCOUNTER — Other Ambulatory Visit: Payer: Self-pay | Admitting: Family Medicine

## 2019-02-01 ENCOUNTER — Ambulatory Visit: Payer: BLUE CROSS/BLUE SHIELD | Attending: Family Medicine | Admitting: Physical Therapy

## 2019-02-01 ENCOUNTER — Encounter: Payer: Self-pay | Admitting: Physical Therapy

## 2019-02-01 DIAGNOSIS — E785 Hyperlipidemia, unspecified: Secondary | ICD-10-CM

## 2019-02-01 DIAGNOSIS — R262 Difficulty in walking, not elsewhere classified: Secondary | ICD-10-CM | POA: Insufficient documentation

## 2019-02-01 DIAGNOSIS — M6281 Muscle weakness (generalized): Secondary | ICD-10-CM | POA: Insufficient documentation

## 2019-02-01 DIAGNOSIS — R2681 Unsteadiness on feet: Secondary | ICD-10-CM | POA: Diagnosis not present

## 2019-02-01 NOTE — Telephone Encounter (Signed)
Last lipid 03/05/18
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
## 2019-02-03 ENCOUNTER — Ambulatory Visit: Payer: BLUE CROSS/BLUE SHIELD | Admitting: Physical Therapy

## 2019-02-03 ENCOUNTER — Encounter: Payer: Self-pay | Admitting: Physical Therapy

## 2019-02-03 DIAGNOSIS — M6281 Muscle weakness (generalized): Secondary | ICD-10-CM | POA: Diagnosis not present

## 2019-02-03 DIAGNOSIS — R262 Difficulty in walking, not elsewhere classified: Secondary | ICD-10-CM

## 2019-02-03 DIAGNOSIS — G4733 Obstructive sleep apnea (adult) (pediatric): Secondary | ICD-10-CM | POA: Diagnosis not present

## 2019-02-03 DIAGNOSIS — R2681 Unsteadiness on feet: Secondary | ICD-10-CM
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
## 2019-02-04 DIAGNOSIS — J449 Chronic obstructive pulmonary disease, unspecified: Secondary | ICD-10-CM | POA: Diagnosis not present

## 2019-02-07 ENCOUNTER — Telehealth: Payer: Self-pay | Admitting: *Deleted

## 2019-02-07 ENCOUNTER — Other Ambulatory Visit: Payer: Self-pay | Admitting: Pediatrics

## 2019-02-07 ENCOUNTER — Encounter: Payer: Self-pay | Admitting: Physical Therapy

## 2019-02-07 ENCOUNTER — Ambulatory Visit: Payer: BLUE CROSS/BLUE SHIELD | Admitting: Physical Therapy

## 2019-02-07 DIAGNOSIS — R2681 Unsteadiness on feet: Secondary | ICD-10-CM

## 2019-02-07 DIAGNOSIS — R262 Difficulty in walking, not elsewhere classified: Secondary | ICD-10-CM | POA: Diagnosis not present

## 2019-02-07 DIAGNOSIS — M6281 Muscle weakness (generalized): Secondary | ICD-10-CM

## 2019-02-07 NOTE — Telephone Encounter (Signed)
Returned patient's wife phone call, spoke with patient's wife- [name].
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
## 2019-02-09 ENCOUNTER — Ambulatory Visit (INDEPENDENT_AMBULATORY_CARE_PROVIDER_SITE_OTHER): Payer: BLUE CROSS/BLUE SHIELD | Admitting: Urology

## 2019-02-09 ENCOUNTER — Encounter: Payer: Self-pay | Admitting: Pharmacist Clinician (PhC)/ Clinical Pharmacy Specialist

## 2019-02-09 ENCOUNTER — Ambulatory Visit: Payer: BLUE CROSS/BLUE SHIELD | Admitting: Pharmacist Clinician (PhC)/ Clinical Pharmacy Specialist

## 2019-02-09 DIAGNOSIS — R351 Nocturia: Secondary | ICD-10-CM

## 2019-02-09 DIAGNOSIS — N401 Enlarged prostate with lower urinary tract symptoms: Secondary | ICD-10-CM | POA: Diagnosis not present

## 2019-02-09 DIAGNOSIS — Z86711 Personal history of pulmonary embolism: Secondary | ICD-10-CM

## 2019-02-09 DIAGNOSIS — G4733 Obstructive sleep apnea (adult) (pediatric): Secondary | ICD-10-CM | POA: Diagnosis not present

## 2019-02-09 DIAGNOSIS — R339 Retention of urine, unspecified: Secondary | ICD-10-CM

## 2019-02-09 DIAGNOSIS — Z86718 Personal history of other venous thrombosis and embolism: Secondary | ICD-10-CM

## 2019-02-09 LAB — COAGUCHEK XS/INR WAIVED
INR: 1.2 — ABNORMAL HIGH (ref 0.9–1.1)
Prothrombin Time: 14 s

## 2019-02-09 NOTE — Patient Instructions (Signed)
Description   Take 3mg  today and tomorrow, then take 2mg  every day.  INR 1.2 today (goal is 2-3)  Too thick today

## 2019-02-10 ENCOUNTER — Encounter: Payer: Self-pay | Admitting: Physical Therapy

## 2019-02-10 ENCOUNTER — Ambulatory Visit: Payer: BLUE CROSS/BLUE SHIELD | Admitting: Physical Therapy

## 2019-02-10 DIAGNOSIS — R2681 Unsteadiness on feet: Secondary | ICD-10-CM

## 2019-02-10 DIAGNOSIS — R262 Difficulty in walking, not elsewhere classified: Secondary | ICD-10-CM | POA: Diagnosis not present

## 2019-02-10 DIAGNOSIS — M6281 Muscle weakness (generalized): Secondary | ICD-10-CM
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
## 2019-02-11 ENCOUNTER — Telehealth: Payer: Self-pay | Admitting: *Deleted

## 2019-02-11 NOTE — Telephone Encounter (Signed)
CALLED PATIENT TO INFORM OF CT FOR 02-15-19 - ARRIVAL TIME - 8 AM @ [facility] PATIENT TO BE NPO- 4 HRS. PRIOR TO TEST, PATIENT TO FU WITH ASHLYN BRUNING ON 02-16-19 FOR RESULTS, NO ANSWER UNABLE TO LEAVE MESSAGE

## 2019-02-15 ENCOUNTER — Ambulatory Visit: Payer: BLUE CROSS/BLUE SHIELD | Admitting: Physical Therapy

## 2019-02-15 ENCOUNTER — Encounter: Payer: Self-pay | Admitting: Physical Therapy

## 2019-02-15 VITALS — HR 111

## 2019-02-15 DIAGNOSIS — M6281 Muscle weakness (generalized): Secondary | ICD-10-CM

## 2019-02-15 DIAGNOSIS — R2681 Unsteadiness on feet: Secondary | ICD-10-CM | POA: Diagnosis not present

## 2019-02-15 DIAGNOSIS — R262 Difficulty in walking, not elsewhere classified: Secondary | ICD-10-CM
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
## 2019-02-16 ENCOUNTER — Ambulatory Visit: Payer: Self-pay | Admitting: Urology

## 2019-02-16 NOTE — Addendum Note (Signed)
Addended by: [name] D on: 02/16/2019 11:48 AM   Modules accepted: Orders

## 2019-02-17 ENCOUNTER — Ambulatory Visit: Payer: BLUE CROSS/BLUE SHIELD | Admitting: Physical Therapy

## 2019-02-17 VITALS — HR 88

## 2019-02-17 DIAGNOSIS — R2681 Unsteadiness on feet: Secondary | ICD-10-CM | POA: Diagnosis not present

## 2019-02-17 DIAGNOSIS — R262 Difficulty in walking, not elsewhere classified: Secondary | ICD-10-CM | POA: Diagnosis not present

## 2019-02-17 DIAGNOSIS — M6281 Muscle weakness (generalized): Secondary | ICD-10-CM
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
## 2019-02-18 ENCOUNTER — Telehealth: Payer: Self-pay | Admitting: *Deleted

## 2019-02-18 NOTE — Telephone Encounter (Signed)
Called patient to inform of CT for 02/23/19 - arrival time - 7:45 am @ [facility], pt. to have water only - 4 hrs. prior to test, Ashlyn to call patient with results, spoke to [employer] (wife) and she is aware of this scan.

## 2019-02-21 ENCOUNTER — Ambulatory Visit: Payer: BLUE CROSS/BLUE SHIELD | Admitting: Physical Therapy

## 2019-02-21 ENCOUNTER — Encounter: Payer: Self-pay | Admitting: Physical Therapy

## 2019-02-21 VITALS — HR 110

## 2019-02-21 DIAGNOSIS — M6281 Muscle weakness (generalized): Secondary | ICD-10-CM

## 2019-02-21 DIAGNOSIS — R2681 Unsteadiness on feet: Secondary | ICD-10-CM | POA: Diagnosis not present

## 2019-02-21 DIAGNOSIS — R262 Difficulty in walking, not elsewhere classified: Secondary | ICD-10-CM
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
## 2019-02-22 ENCOUNTER — Telehealth: Payer: Self-pay | Admitting: Urology

## 2019-02-23 ENCOUNTER — Ambulatory Visit: Payer: BLUE CROSS/BLUE SHIELD | Admitting: Pharmacist Clinician (PhC)/ Clinical Pharmacy Specialist

## 2019-02-23 ENCOUNTER — Ambulatory Visit (HOSPITAL_COMMUNITY): Payer: Medicare Other

## 2019-02-23 DIAGNOSIS — Z86718 Personal history of other venous thrombosis and embolism: Secondary | ICD-10-CM

## 2019-02-23 DIAGNOSIS — Z86711 Personal history of pulmonary embolism: Secondary | ICD-10-CM

## 2019-02-23 LAB — COAGUCHEK XS/INR WAIVED
INR: 1.8 — ABNORMAL HIGH (ref 0.9–1.1)
Prothrombin Time: 21.9 s

## 2019-02-23 NOTE — Patient Instructions (Signed)
Description   Take 3mg  on Wednesdays and 2mg  all other days of the week.  INR 1.8 today (goal is 2-3)  Slightly thick today

## 2019-02-24 ENCOUNTER — Ambulatory Visit: Payer: BLUE CROSS/BLUE SHIELD | Admitting: *Deleted

## 2019-02-24 DIAGNOSIS — R262 Difficulty in walking, not elsewhere classified: Secondary | ICD-10-CM | POA: Diagnosis not present

## 2019-02-24 DIAGNOSIS — R2681 Unsteadiness on feet: Secondary | ICD-10-CM

## 2019-02-24 DIAGNOSIS — M6281 Muscle weakness (generalized): Secondary | ICD-10-CM | POA: Diagnosis not present
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
## 2019-02-25 ENCOUNTER — Encounter (HOSPITAL_COMMUNITY): Payer: Self-pay

## 2019-02-25 ENCOUNTER — Ambulatory Visit (HOSPITAL_COMMUNITY)
Admission: RE | Admit: 2019-02-25 | Discharge: 2019-02-25 | Disposition: A | Discharge status: Home / Self Care | Payer: BLUE CROSS/BLUE SHIELD | Source: Ambulatory Visit | Attending: Urology | Admitting: Urology

## 2019-02-25 DIAGNOSIS — C3490 Malignant neoplasm of unspecified part of unspecified bronchus or lung: Secondary | ICD-10-CM | POA: Diagnosis not present

## 2019-02-25 DIAGNOSIS — C3412 Malignant neoplasm of upper lobe, left bronchus or lung: Secondary | ICD-10-CM | POA: Insufficient documentation

## 2019-02-25 LAB — POCT I-STAT CREATININE: Creatinine, Ser: 0.9 mg/dL (ref 0.61–1.24)

## 2019-02-25 MED ORDER — IOHEXOL 300 MG/ML  SOLN
75.0000 mL | Freq: Once | INTRAMUSCULAR | Status: AC | PRN
  Administered 2019-02-25: 75 mL via INTRAVENOUS

## 2019-02-28 ENCOUNTER — Encounter: Payer: Self-pay | Admitting: Urology

## 2019-02-28 ENCOUNTER — Ambulatory Visit: Payer: Medicare Other | Admitting: Physical Therapy

## 2019-02-28 ENCOUNTER — Telehealth: Payer: Self-pay | Admitting: Radiation Oncology

## 2019-02-28 DIAGNOSIS — C3412 Malignant neoplasm of upper lobe, left bronchus or lung: Secondary | ICD-10-CM

## 2019-02-28 DIAGNOSIS — C3431 Malignant neoplasm of lower lobe, right bronchus or lung: Secondary | ICD-10-CM

## 2019-02-28 NOTE — Progress Notes (Signed)
Nursing staff to notify patient of results from his recent follow up chest CT for restaging lung cancer which shows stability of previously treated lung cancer and no evidence of new disease but interval development of a new compression fx at T6 with 75% height loss.  If patient is having significant symptoms, may want to consider kyphoplasty for pain relief. I have sent a copy of this report to his PCP, Dr. Warrick Parisian and orthopedist, Dr. Metta Clines to make final decision/recommendation regarding treatment/management.  We will plan to repeat a Chest CT in 6 months for lung cancer restaging and will see the patient back in the office thereafter to review results. Ailene Ards

## 2019-02-28 NOTE — Telephone Encounter (Signed)
Phoned patient's wife, Travis Cruz, as requested by [employer], PA-C. Explained the following: "advise her that the follow up chest CT for restaging lung cancer shows stability of previously treated lung cancer and no evidence for new disease but interval development of a new compression fx at T6 with 75% height loss. I have sent a copy of this report to his orthopedist, Dr. Metta Clines and his PCP, Dr. Warrick Parisian and indicated that if the patient is having significant symptoms (pain between should blades/bra line), they may want to consider kyphoplasty for pain relief if he is a candidate. I know he is not a surgical candidate but he may be considered for a minor outpatient procedure like kyphoplasty. We will plan to repeat a chest CT in 6 months and follow up in the office thereafter to review results"  Explained that if she hasn't heard from either his orthopedic doctor or PCP in the next 48 hour to call them direct. Stressed that if further assistance was needed reference this patient to please phone this RN. She questions why the prior CT didn't show any indications of this compression fracture and what caused it? Offered to pose these questions to Ashlyn and phone back but [name] denied needs expressing her intent to review the scan with his PCP. She verbalized understanding of all reviewed and expressed appreciation for the call.

## 2019-03-01 ENCOUNTER — Encounter: Payer: Self-pay | Admitting: Physical Therapy

## 2019-03-01 ENCOUNTER — Ambulatory Visit: Payer: Medicare Other | Attending: Family Medicine | Admitting: Physical Therapy

## 2019-03-01 ENCOUNTER — Telehealth: Payer: Self-pay

## 2019-03-01 DIAGNOSIS — R2681 Unsteadiness on feet: Secondary | ICD-10-CM

## 2019-03-01 DIAGNOSIS — M6281 Muscle weakness (generalized): Secondary | ICD-10-CM

## 2019-03-01 DIAGNOSIS — R262 Difficulty in walking, not elsewhere classified: Secondary | ICD-10-CM | POA: Insufficient documentation
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
## 2019-03-01 NOTE — Telephone Encounter (Signed)
Wife aware and verbalizes understanding- states patient does not have pain but will let us know if he starts to.

## 2019-03-01 NOTE — Telephone Encounter (Signed)
-----   Message from Worthy Rancher, MD sent at 03/01/2019  8:00 AM EST ----- Please let patient know about the CT scan that his pulmonologist did which does show that he has a T6 compression fracture of the spine, if he still having significant pain in his back that we may want to do a referral to interventional radiology to consider possible kyphoplasty to repair it ----- Message ----- From: [name], PA-C Sent: 02/28/2019   8:58 AM EST To: Mali W Applegate, PT, Fransisca Kaufmann Dettinger, MD  Just FYI- follow up chest CT for restaging lung cancer shows stability of previously treated lung cancer but interval development of a new compression fx at T6 with 75% height loss.  If patient is having significant symptoms, may want to consider kyphoplasty for pain relief. -Ashlyn

## 2019-03-02 ENCOUNTER — Encounter (HOSPITAL_COMMUNITY): Payer: Self-pay | Admitting: Emergency Medicine

## 2019-03-02 ENCOUNTER — Emergency Department (HOSPITAL_COMMUNITY): Payer: BLUE CROSS/BLUE SHIELD

## 2019-03-02 ENCOUNTER — Other Ambulatory Visit: Payer: Self-pay

## 2019-03-02 ENCOUNTER — Inpatient Hospital Stay (HOSPITAL_COMMUNITY)
Admission: EM | Admit: 2019-03-02 | Discharge: 2019-03-07 | DRG: 292 | Disposition: A | Discharge status: Skilled Nursing Facility | Payer: BLUE CROSS/BLUE SHIELD | Attending: Internal Medicine | Admitting: Internal Medicine

## 2019-03-02 DIAGNOSIS — I2510 Atherosclerotic heart disease of native coronary artery without angina pectoris: Secondary | ICD-10-CM | POA: Diagnosis not present

## 2019-03-02 DIAGNOSIS — Z8673 Personal history of transient ischemic attack (TIA), and cerebral infarction without residual deficits: Secondary | ICD-10-CM

## 2019-03-02 DIAGNOSIS — I110 Hypertensive heart disease with heart failure: Principal | ICD-10-CM | POA: Diagnosis present

## 2019-03-02 DIAGNOSIS — M25522 Pain in left elbow: Secondary | ICD-10-CM

## 2019-03-02 DIAGNOSIS — W19XXXS Unspecified fall, sequela: Secondary | ICD-10-CM | POA: Diagnosis not present

## 2019-03-02 DIAGNOSIS — S299XXA Unspecified injury of thorax, initial encounter: Secondary | ICD-10-CM | POA: Diagnosis not present

## 2019-03-02 DIAGNOSIS — Z9981 Dependence on supplemental oxygen: Secondary | ICD-10-CM

## 2019-03-02 DIAGNOSIS — I5033 Acute on chronic diastolic (congestive) heart failure: Secondary | ICD-10-CM | POA: Diagnosis not present

## 2019-03-02 DIAGNOSIS — R41841 Cognitive communication deficit: Secondary | ICD-10-CM | POA: Diagnosis not present

## 2019-03-02 DIAGNOSIS — I493 Ventricular premature depolarization: Secondary | ICD-10-CM | POA: Diagnosis present

## 2019-03-02 DIAGNOSIS — R000 Tachycardia, unspecified: Secondary | ICD-10-CM | POA: Diagnosis not present

## 2019-03-02 DIAGNOSIS — Z8674 Personal history of sudden cardiac arrest: Secondary | ICD-10-CM

## 2019-03-02 DIAGNOSIS — Z8249 Family history of ischemic heart disease and other diseases of the circulatory system: Secondary | ICD-10-CM

## 2019-03-02 DIAGNOSIS — M545 Low back pain, unspecified: Secondary | ICD-10-CM

## 2019-03-02 DIAGNOSIS — S51012A Laceration without foreign body of left elbow, initial encounter: Secondary | ICD-10-CM | POA: Diagnosis not present

## 2019-03-02 DIAGNOSIS — I472 Ventricular tachycardia: Secondary | ICD-10-CM | POA: Diagnosis not present

## 2019-03-02 DIAGNOSIS — Z79899 Other long term (current) drug therapy: Secondary | ICD-10-CM

## 2019-03-02 DIAGNOSIS — C3490 Malignant neoplasm of unspecified part of unspecified bronchus or lung: Secondary | ICD-10-CM | POA: Diagnosis not present

## 2019-03-02 DIAGNOSIS — F0390 Unspecified dementia without behavioral disturbance: Secondary | ICD-10-CM | POA: Diagnosis not present

## 2019-03-02 DIAGNOSIS — S0990XA Unspecified injury of head, initial encounter: Secondary | ICD-10-CM

## 2019-03-02 DIAGNOSIS — I255 Ischemic cardiomyopathy: Secondary | ICD-10-CM | POA: Diagnosis not present

## 2019-03-02 DIAGNOSIS — Z7901 Long term (current) use of anticoagulants: Secondary | ICD-10-CM

## 2019-03-02 DIAGNOSIS — J432 Centrilobular emphysema: Secondary | ICD-10-CM | POA: Diagnosis not present

## 2019-03-02 DIAGNOSIS — I5032 Chronic diastolic (congestive) heart failure: Secondary | ICD-10-CM | POA: Diagnosis not present

## 2019-03-02 DIAGNOSIS — H353190 Nonexudative age-related macular degeneration, unspecified eye, stage unspecified: Secondary | ICD-10-CM | POA: Diagnosis present

## 2019-03-02 DIAGNOSIS — W1839XA Other fall on same level, initial encounter: Secondary | ICD-10-CM | POA: Diagnosis present

## 2019-03-02 DIAGNOSIS — Z87891 Personal history of nicotine dependence: Secondary | ICD-10-CM

## 2019-03-02 DIAGNOSIS — R410 Disorientation, unspecified: Secondary | ICD-10-CM | POA: Diagnosis not present

## 2019-03-02 DIAGNOSIS — R791 Abnormal coagulation profile: Secondary | ICD-10-CM | POA: Diagnosis present

## 2019-03-02 DIAGNOSIS — R609 Edema, unspecified: Secondary | ICD-10-CM

## 2019-03-02 DIAGNOSIS — W19XXXA Unspecified fall, initial encounter: Secondary | ICD-10-CM | POA: Diagnosis present

## 2019-03-02 DIAGNOSIS — R55 Syncope and collapse: Secondary | ICD-10-CM | POA: Diagnosis not present

## 2019-03-02 DIAGNOSIS — Z7983 Long term (current) use of bisphosphonates: Secondary | ICD-10-CM

## 2019-03-02 DIAGNOSIS — S0003XA Contusion of scalp, initial encounter: Secondary | ICD-10-CM | POA: Diagnosis present

## 2019-03-02 DIAGNOSIS — Z8669 Personal history of other diseases of the nervous system and sense organs: Secondary | ICD-10-CM

## 2019-03-02 DIAGNOSIS — Z86711 Personal history of pulmonary embolism: Secondary | ICD-10-CM

## 2019-03-02 DIAGNOSIS — Z923 Personal history of irradiation: Secondary | ICD-10-CM

## 2019-03-02 DIAGNOSIS — Z86718 Personal history of other venous thrombosis and embolism: Secondary | ICD-10-CM

## 2019-03-02 DIAGNOSIS — W19XXXD Unspecified fall, subsequent encounter: Secondary | ICD-10-CM | POA: Diagnosis not present

## 2019-03-02 DIAGNOSIS — Q211 Atrial septal defect: Secondary | ICD-10-CM | POA: Diagnosis not present

## 2019-03-02 DIAGNOSIS — Z888 Allergy status to other drugs, medicaments and biological substances status: Secondary | ICD-10-CM

## 2019-03-02 DIAGNOSIS — I4891 Unspecified atrial fibrillation: Secondary | ICD-10-CM | POA: Diagnosis not present

## 2019-03-02 DIAGNOSIS — Z8679 Personal history of other diseases of the circulatory system: Secondary | ICD-10-CM

## 2019-03-02 DIAGNOSIS — Z85118 Personal history of other malignant neoplasm of bronchus and lung: Secondary | ICD-10-CM

## 2019-03-02 DIAGNOSIS — R1312 Dysphagia, oropharyngeal phase: Secondary | ICD-10-CM | POA: Diagnosis not present

## 2019-03-02 DIAGNOSIS — J9610 Chronic respiratory failure, unspecified whether with hypoxia or hypercapnia: Secondary | ICD-10-CM | POA: Diagnosis present

## 2019-03-02 DIAGNOSIS — S79912A Unspecified injury of left hip, initial encounter: Secondary | ICD-10-CM | POA: Diagnosis not present

## 2019-03-02 DIAGNOSIS — N400 Enlarged prostate without lower urinary tract symptoms: Secondary | ICD-10-CM | POA: Diagnosis present

## 2019-03-02 DIAGNOSIS — J449 Chronic obstructive pulmonary disease, unspecified: Secondary | ICD-10-CM | POA: Diagnosis present

## 2019-03-02 DIAGNOSIS — S199XXA Unspecified injury of neck, initial encounter: Secondary | ICD-10-CM | POA: Diagnosis not present

## 2019-03-02 DIAGNOSIS — Z9081 Acquired absence of spleen: Secondary | ICD-10-CM | POA: Diagnosis not present

## 2019-03-02 DIAGNOSIS — Y92009 Unspecified place in unspecified non-institutional (private) residence as the place of occurrence of the external cause: Secondary | ICD-10-CM | POA: Diagnosis not present

## 2019-03-02 DIAGNOSIS — G4733 Obstructive sleep apnea (adult) (pediatric): Secondary | ICD-10-CM | POA: Diagnosis present

## 2019-03-02 DIAGNOSIS — H353130 Nonexudative age-related macular degeneration, bilateral, stage unspecified: Secondary | ICD-10-CM | POA: Diagnosis not present

## 2019-03-02 DIAGNOSIS — Z95810 Presence of automatic (implantable) cardiac defibrillator: Secondary | ICD-10-CM | POA: Diagnosis not present

## 2019-03-02 DIAGNOSIS — I252 Old myocardial infarction: Secondary | ICD-10-CM

## 2019-03-02 DIAGNOSIS — M25552 Pain in left hip: Secondary | ICD-10-CM

## 2019-03-02 DIAGNOSIS — S59902A Unspecified injury of left elbow, initial encounter: Secondary | ICD-10-CM | POA: Diagnosis not present

## 2019-03-02 DIAGNOSIS — E785 Hyperlipidemia, unspecified: Secondary | ICD-10-CM | POA: Diagnosis not present

## 2019-03-02 DIAGNOSIS — Z801 Family history of malignant neoplasm of trachea, bronchus and lung: Secondary | ICD-10-CM

## 2019-03-02 DIAGNOSIS — M6281 Muscle weakness (generalized): Secondary | ICD-10-CM | POA: Diagnosis not present

## 2019-03-02 DIAGNOSIS — Z79891 Long term (current) use of opiate analgesic: Secondary | ICD-10-CM

## 2019-03-02 DIAGNOSIS — R262 Difficulty in walking, not elsewhere classified: Secondary | ICD-10-CM | POA: Diagnosis not present

## 2019-03-02 LAB — CBC WITH DIFFERENTIAL/PLATELET
Abs Immature Granulocytes: 0.14 10*3/uL — ABNORMAL HIGH (ref 0.00–0.07)
Basophils Absolute: 0.1 10*3/uL (ref 0.0–0.1)
Basophils Relative: 0 %
Eosinophils Absolute: 0 10*3/uL (ref 0.0–0.5)
Eosinophils Relative: 0 %
HCT: 52.9 % — ABNORMAL HIGH (ref 39.0–52.0)
Hemoglobin: 16.3 g/dL (ref 13.0–17.0)
Immature Granulocytes: 1 %
Lymphocytes Relative: 7 %
Lymphs Abs: 1.4 10*3/uL (ref 0.7–4.0)
MCH: 27.7 pg (ref 26.0–34.0)
MCHC: 30.8 g/dL (ref 30.0–36.0)
MCV: 89.8 fL (ref 80.0–100.0)
Monocytes Absolute: 2.6 10*3/uL — ABNORMAL HIGH (ref 0.1–1.0)
Monocytes Relative: 13 %
Neutro Abs: 15.5 10*3/uL — ABNORMAL HIGH (ref 1.7–7.7)
Neutrophils Relative %: 79 %
Platelets: 356 10*3/uL (ref 150–400)
RBC: 5.89 MIL/uL — ABNORMAL HIGH (ref 4.22–5.81)
RDW: 18.8 % — ABNORMAL HIGH (ref 11.5–15.5)
WBC: 19.7 10*3/uL — ABNORMAL HIGH (ref 4.0–10.5)
nRBC: 0 % (ref 0.0–0.2)

## 2019-03-02 LAB — COMPREHENSIVE METABOLIC PANEL
ALT: 28 U/L (ref 0–44)
AST: 39 U/L (ref 15–41)
Albumin: 4 g/dL (ref 3.5–5.0)
Alkaline Phosphatase: 209 U/L — ABNORMAL HIGH (ref 38–126)
Anion gap: 10 (ref 5–15)
BUN: 14 mg/dL (ref 8–23)
CO2: 26 mmol/L (ref 22–32)
Calcium: 9 mg/dL (ref 8.9–10.3)
Chloride: 103 mmol/L (ref 98–111)
Creatinine, Ser: 0.87 mg/dL (ref 0.61–1.24)
GFR calc Af Amer: >60 mL/min (ref >60)
GFR calc non Af Amer: >60 mL/min (ref >60)
Glucose, Bld: 102 mg/dL — ABNORMAL HIGH (ref 70–99)
Potassium: 3.8 mmol/L (ref 3.5–5.1)
Sodium: 139 mmol/L (ref 135–145)
Total Bilirubin: 0.5 mg/dL (ref 0.3–1.2)
Total Protein: 7.8 g/dL (ref 6.5–8.1)

## 2019-03-02 LAB — PROTIME-INR
INR: 1.6 — ABNORMAL HIGH (ref 0.8–1.2)
Prothrombin Time: 19.2 seconds — ABNORMAL HIGH (ref 11.4–15.2)

## 2019-03-02 LAB — URINALYSIS, ROUTINE W REFLEX MICROSCOPIC
Bilirubin Urine: NEGATIVE
Glucose, UA: NEGATIVE mg/dL
Hgb urine dipstick: NEGATIVE
Ketones, ur: 5 mg/dL — AB
Leukocytes,Ua: NEGATIVE
Nitrite: NEGATIVE
Protein, ur: NEGATIVE mg/dL
Specific Gravity, Urine: 1.013 (ref 1.005–1.030)
pH: 7 (ref 5.0–8.0)

## 2019-03-02 LAB — TROPONIN I: Troponin I: 0.05 ng/mL (ref <0.03)

## 2019-03-02 LAB — CK: Total CK: 278 U/L (ref 49–397)

## 2019-03-02 MED ORDER — WARFARIN - PHARMACIST DOSING INPATIENT
Freq: Every day | Status: DC
  Administered 2019-03-03 – 2019-03-06 (×2)

## 2019-03-02 MED ORDER — SODIUM CHLORIDE 0.9 % IV BOLUS
500.0000 mL | Freq: Once | INTRAVENOUS | Status: AC
  Administered 2019-03-02: 500 mL via INTRAVENOUS

## 2019-03-02 MED ORDER — WARFARIN SODIUM 4 MG PO TABS
4.0000 mg | ORAL_TABLET | Freq: Once | ORAL | Status: AC
  Administered 2019-03-02: 4 mg via ORAL
  Filled 2019-03-02: qty 1
  Filled 2019-03-02: qty 2

## 2019-03-02 NOTE — ED Notes (Signed)
Patient transported to X-ray 

## 2019-03-02 NOTE — ED Triage Notes (Signed)
Pt from home. [facility] today, family found pt on the floor.  Unknown how long pt was in the floor. HX of dementia.   A/O x 4.  Skins tears noted to arms.

## 2019-03-02 NOTE — ED Notes (Signed)
Report given to [facility]
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
## 2019-03-02 NOTE — ED Notes (Signed)
Date and time results received: 03/02/19 7:45 PM   Test: Troponin Critical Value: 0.05  Name of Provider Notified: Dr. Laverta Baltimore  Orders Received? Or Actions Taken?: See orders

## 2019-03-03 ENCOUNTER — Ambulatory Visit: Payer: Medicare Other | Admitting: Physical Therapy

## 2019-03-03 ENCOUNTER — Inpatient Hospital Stay (HOSPITAL_COMMUNITY): Payer: BLUE CROSS/BLUE SHIELD

## 2019-03-03 DIAGNOSIS — Z7901 Long term (current) use of anticoagulants: Secondary | ICD-10-CM

## 2019-03-03 DIAGNOSIS — Y92009 Unspecified place in unspecified non-institutional (private) residence as the place of occurrence of the external cause: Secondary | ICD-10-CM

## 2019-03-03 DIAGNOSIS — I5032 Chronic diastolic (congestive) heart failure: Secondary | ICD-10-CM

## 2019-03-03 DIAGNOSIS — F0390 Unspecified dementia without behavioral disturbance: Secondary | ICD-10-CM

## 2019-03-03 DIAGNOSIS — Z8674 Personal history of sudden cardiac arrest: Secondary | ICD-10-CM

## 2019-03-03 DIAGNOSIS — W19XXXD Unspecified fall, subsequent encounter: Secondary | ICD-10-CM

## 2019-03-03 DIAGNOSIS — W19XXXS Unspecified fall, sequela: Secondary | ICD-10-CM

## 2019-03-03 DIAGNOSIS — R000 Tachycardia, unspecified: Secondary | ICD-10-CM

## 2019-03-03 DIAGNOSIS — R55 Syncope and collapse: Secondary | ICD-10-CM

## 2019-03-03 LAB — PROTIME-INR
INR: 1.6 — ABNORMAL HIGH (ref 0.8–1.2)
Prothrombin Time: 18.9 seconds — ABNORMAL HIGH (ref 11.4–15.2)

## 2019-03-03 LAB — CBC
HCT: 50.1 % (ref 39.0–52.0)
Hemoglobin: 15.6 g/dL (ref 13.0–17.0)
MCH: 28 pg (ref 26.0–34.0)
MCHC: 31.1 g/dL (ref 30.0–36.0)
MCV: 89.8 fL (ref 80.0–100.0)
Platelets: 337 10*3/uL (ref 150–400)
RBC: 5.58 MIL/uL (ref 4.22–5.81)
RDW: 18.3 % — ABNORMAL HIGH (ref 11.5–15.5)
WBC: 17.5 10*3/uL — ABNORMAL HIGH (ref 4.0–10.5)
nRBC: 0 % (ref 0.0–0.2)

## 2019-03-03 LAB — TROPONIN I
Troponin I: 0.05 ng/mL (ref <0.03)
Troponin I: 0.04 ng/mL (ref <0.03)
Troponin I: 0.03 ng/mL (ref <0.03)

## 2019-03-03 LAB — ECHOCARDIOGRAM COMPLETE
Height: 69 in
Weight: 3488.56 oz

## 2019-03-03 LAB — COMPREHENSIVE METABOLIC PANEL
ALT: 27 U/L (ref 0–44)
AST: 35 U/L (ref 15–41)
Albumin: 3.6 g/dL (ref 3.5–5.0)
Alkaline Phosphatase: 186 U/L — ABNORMAL HIGH (ref 38–126)
Anion gap: 9 (ref 5–15)
BUN: 10 mg/dL (ref 8–23)
CO2: 24 mmol/L (ref 22–32)
Calcium: 8.1 mg/dL — ABNORMAL LOW (ref 8.9–10.3)
Chloride: 106 mmol/L (ref 98–111)
Creatinine, Ser: 0.75 mg/dL (ref 0.61–1.24)
GFR calc Af Amer: >60 mL/min (ref >60)
GFR calc non Af Amer: >60 mL/min (ref >60)
Glucose, Bld: 112 mg/dL — ABNORMAL HIGH (ref 70–99)
Potassium: 3.4 mmol/L — ABNORMAL LOW (ref 3.5–5.1)
Sodium: 139 mmol/L (ref 135–145)
Total Bilirubin: 1.2 mg/dL (ref 0.3–1.2)
Total Protein: 7 g/dL (ref 6.5–8.1)

## 2019-03-03 LAB — BRAIN NATRIURETIC PEPTIDE: B Natriuretic Peptide: 100 pg/mL (ref 0.0–100.0)

## 2019-03-03 MED ORDER — FINASTERIDE 5 MG PO TABS
5.0000 mg | ORAL_TABLET | Freq: Every day | ORAL | Status: DC
  Administered 2019-03-03 – 2019-03-07 (×5): 5 mg via ORAL
  Filled 2019-03-03 (×5): qty 1

## 2019-03-03 MED ORDER — SODIUM CHLORIDE 0.9% FLUSH
3.0000 mL | Freq: Two times a day (BID) | INTRAVENOUS | Status: DC
  Administered 2019-03-03 – 2019-03-07 (×8): 3 mL via INTRAVENOUS

## 2019-03-03 MED ORDER — ACETAMINOPHEN 650 MG RE SUPP: 650.0000 mg | Freq: Four times a day (QID) | RECTAL | Status: DC | PRN

## 2019-03-03 MED ORDER — WARFARIN SODIUM 2 MG PO TABS
4.0000 mg | ORAL_TABLET | Freq: Once | ORAL | Status: AC
  Administered 2019-03-03: 4 mg via ORAL
  Filled 2019-03-03: qty 2

## 2019-03-03 MED ORDER — TAMSULOSIN HCL 0.4 MG PO CAPS
0.8000 mg | ORAL_CAPSULE | Freq: Every day | ORAL | Status: DC
  Administered 2019-03-03 – 2019-03-07 (×4): 0.8 mg via ORAL
  Filled 2019-03-03 (×5): qty 2

## 2019-03-03 MED ORDER — ACETAMINOPHEN 325 MG PO TABS: 650.0000 mg | ORAL_TABLET | Freq: Four times a day (QID) | ORAL | Status: DC | PRN

## 2019-03-03 MED ORDER — SODIUM CHLORIDE 0.9% FLUSH
3.0000 mL | INTRAVENOUS | Status: DC | PRN
  Administered 2019-03-04: 3 mL via INTRAVENOUS
  Filled 2019-03-03: qty 3

## 2019-03-03 MED ORDER — ATORVASTATIN CALCIUM 40 MG PO TABS
80.0000 mg | ORAL_TABLET | ORAL | Status: DC
  Administered 2019-03-04 – 2019-03-07 (×2): 80 mg via ORAL
  Filled 2019-03-03 (×4): qty 2

## 2019-03-03 MED ORDER — FUROSEMIDE 10 MG/ML IJ SOLN
40.0000 mg | Freq: Two times a day (BID) | INTRAMUSCULAR | Status: DC
  Administered 2019-03-03 – 2019-03-05 (×4): 40 mg via INTRAVENOUS
  Filled 2019-03-03 (×4): qty 4

## 2019-03-03 MED ORDER — VITAMIN D 25 MCG (1000 UNIT) PO TABS
2000.0000 [IU] | ORAL_TABLET | Freq: Every day | ORAL | Status: DC
  Administered 2019-03-03 – 2019-03-07 (×5): 2000 [IU] via ORAL
  Filled 2019-03-03 (×5): qty 2

## 2019-03-03 MED ORDER — METOPROLOL TARTRATE 25 MG PO TABS
37.5000 mg | ORAL_TABLET | Freq: Two times a day (BID) | ORAL | Status: DC
  Administered 2019-03-03 – 2019-03-07 (×9): 37.5 mg via ORAL
  Filled 2019-03-03 (×9): qty 2

## 2019-03-03 MED ORDER — SODIUM CHLORIDE 0.9 % IV SOLN: 250.0000 mL | INTRAVENOUS | Status: DC | PRN

## 2019-03-03 MED ORDER — MIRABEGRON ER 25 MG PO TB24
50.0000 mg | ORAL_TABLET | Freq: Every day | ORAL | Status: DC
  Administered 2019-03-03 – 2019-03-07 (×5): 50 mg via ORAL
  Filled 2019-03-03 (×5): qty 2

## 2019-03-03 NOTE — Progress Notes (Signed)
*  PRELIMINARY RESULTS* Echocardiogram 2D Echocardiogram has been performed.  Leavy Cella 03/03/2019, 10:15 AM
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
## 2019-03-03 NOTE — Progress Notes (Addendum)
    Device interrogated by [employer]. Nurse, mental health. No recent device shocks or significant ventricular arrhythmias to account for his syncopal episode. Thoracic Impedence was elevated in 01/2019 but is now close to baseline.   Signed, Erma Heritage, PA-C 03/03/2019, 9:34 AM Pager: [phone]

## 2019-03-03 NOTE — Consult Note (Addendum)
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 He remains on Atorvastatin 80 mg daily.  7. Dementia -  He  is A&Ox3 during today's encounter but unable to contribute much to the history surrounding yesterday's events.     For questions or updates, please contact [place] Please consult [email or web address] for contact info under Cardiology/STEMI.  Signed, Erma Heritage, PA-C 03/03/2019, 7:52 AM Pager: [phone]  Patient seen and discussed with PA Ahmed Prima, I agree with her documentation. 73 yo male history of chronic systolic HF/ICM most recently with normalized LVEF, lung cancer, CVA, prior PE on chronic coumadin, COPD, VF arrest 2014 unknown etiology with negative EP study now with ICD. Presents with syncope at home occurred while having a BM  INR 1.6 WBC 19.7 Hgb 16.3 Plt 356 K 3.8 Cr 0.87  BNP 100  Trop 0.05-->0.05-->0.04--> CT head no acute process, small parietal scalp hematoms.  CXR vardiomegaly, vascular congestion EKG sinus tach 08/2017 echo LVEF 55-60% ,no WMAs Repeat echo pending   Patient presents from home with fall. The real history of the event is unclear given his dementia and the fact it  was not witnessed. ER history reports he was on the commode, but today he reports he was walking to the bathroom, essentially its unknown what happened. He does have an unsteady gait and uses walker at home. Unclear if mechanical fall or possible syncope. From cardiac standpoint given his history of cardiomyopathy and prior ventricular arrhythmias we will have his device interrogated, if benign no furhter cardiac workup would be planned. Sinus tach 100-120 on tele, baseline rates from clinic visits around 100.     Carlyle Dolly MD

## 2019-03-03 NOTE — Progress Notes (Signed)
PROGRESS NOTE  Travis Cruz  QVZ:[id number]  DOB: 10-11-1946  DOA: 03/02/2019 PCP: Dettinger, Fransisca Kaufmann, MD   Brief Admission Hx: 73 y.o. male with dementia and history of Coronary artery disease, ventricular fibrillation arrest in 2014 status post ICD placement, ischemic cardiomyopathy, chronic diastolic heart failure, lung cancer status post radiation treatment, COPD, obstructive sleep apnea on CPAP came to ED after patient fell at home.  MDM/Assessment & Plan:   1. Fall with possible syncope - unfortunately patient has not been able to recall circumstances leading to event and working him up for syncope given his complex cardiac history.  Appreciate assistance of [facility] team.  No further cardiac workup recommended. Echo is stable and pacer interrogation stable.  Awaiting PT eval.   2. Acute on chronic diastolic CHF - He has been diuresing with lasix. Plan to transition to oral diuretic therapy 3/6.  Monitor weights, intake/output.  3. History of cardiac arrest - Pt is s/p St Jude ICU placement.  4. HTN - stable and controlled.  Following.  5. Dementia - wife remains at bedside for reassurance and guidance.  6. Hyperlipidemia - resume home atorvastatin.  7. Chronic anticoagulation - history of DVT/PE - continue warfarin per pharmacy.  8.   DVT prophylaxis: warfarin  Code Status: Full  Family Communication: wife  Disposition Plan: inpatient    Consultants:  cardiology  Procedures:    Antimicrobials:     Subjective: Pt says he has had no recurrence of symptoms since arrival.   Objective: Vitals:   03/02/19 2230 03/02/19 2330 03/03/19 0019 03/03/19 0500  BP: 130/84 126/86 117/77 126/79  Pulse: (!) 123 (!) 121 (!) 116 (!) 122  Resp: (!) 24 (!) 23 20 20   Temp:   98.3 F (36.8 C) 99.7 F (37.6 C)  TempSrc:   Oral Oral  SpO2: 92% 97% 94% 90%  Weight:   98.9 kg   Height:        Intake/Output Summary (Last 24 hours) at 03/03/2019 1428 Last data filed at  03/03/2019 0900 Gross per 24 hour  Intake 740 ml  Output 790 ml  Net -50 ml   Filed Weights   03/02/19 1754 03/03/19 0019  Weight: 77.1 kg 98.9 kg   REVIEW OF SYSTEMS  As per history otherwise all reviewed and reported negative  Exam:  General exam: awake, alert, NAD, lying in bed.  Cooperative.  Respiratory system: Clear. No increased work of breathing. Cardiovascular system: normal S1 & S2 heard. No JVD, murmurs, gallops, clicks or pedal edema. Gastrointestinal system: Abdomen is nondistended, soft and nontender. Normal bowel sounds heard. Central nervous system: Alert and oriented. No focal neurological deficits. Extremities: bilateral pretibial edema.  Data Reviewed: Basic Metabolic Panel: Recent Labs  Lab 02/25/19 1051 03/02/19 1816 03/03/19 0634  NA  --  139 139  K  --  3.8 3.4*  CL  --  103 106  CO2  --  26 24  GLUCOSE  --  102* 112*  BUN  --  14 10  CREATININE 0.90 0.87 0.75  CALCIUM  --  9.0 8.1*   Liver Function Tests: Recent Labs  Lab 03/02/19 1816 03/03/19 0634  AST 39 35  ALT 28 27  ALKPHOS 209* 186*  BILITOT 0.5 1.2  PROT 7.8 7.0  ALBUMIN 4.0 3.6   No results for input(s): LIPASE, AMYLASE in the last 168 hours. No results for input(s): AMMONIA in the last 168 hours. CBC: Recent Labs  Lab 03/02/19 1816 03/03/19 3295  WBC 19.7* 17.5*  NEUTROABS 15.5*  --   HGB 16.3 15.6  HCT 52.9* 50.1  MCV 89.8 89.8  PLT 356 337   Cardiac Enzymes: Recent Labs  Lab 03/02/19 1816 03/03/19 0033 03/03/19 0634 03/03/19 1204  CKTOTAL 278  --   --   --   TROPONINI 0.05* 0.05* 0.04* 0.03*   CBG (last 3)  No results for input(s): GLUCAP in the last 72 hours. No results found for this or any previous visit (from the past 240 hour(s)).   Studies: Dg Chest 2 View  Result Date: 03/02/2019 CLINICAL DATA:  Syncope and fall. Unwitnessed fall. Shortness of breath. EXAM: CHEST - 2 VIEW COMPARISON:  CT 02/25/2019 FINDINGS: Pacemaker in place with battery pack  overlying the right chest, with the left subclavian venous approach. The heart is enlarged. Unchanged mediastinal contours. Perivascular haziness and peribronchial cuffing left perihilar scarring as seen on CT. No large pleural effusion. No pneumothorax. Bones are under mineralized. Remote left lower rib fractures. No acute osseous abnormalities are seen. IMPRESSION: 1. Cardiomegaly with vascular congestion and probable mild edema. 2. Left perihilar scarring. Electronically Signed   By: Keith Rake M.D.   On: 03/02/2019 19:49   Dg Elbow Complete Left  Result Date: 03/02/2019 CLINICAL DATA:  Left elbow pain after unwitnessed fall. EXAM: LEFT ELBOW - COMPLETE 3+ VIEW COMPARISON:  None. FINDINGS: There is no evidence of fracture, dislocation, or joint effusion. Trace osteoarthritis of the ulnohumeral joint with spurring. Joint spaces are maintained. Soft tissues are unremarkable. IMPRESSION: No fracture or subluxation of the left elbow. Electronically Signed   By: Keith Rake M.D.   On: 03/02/2019 19:50   Ct Head Wo Contrast  Result Date: 03/02/2019 CLINICAL DATA:  [place] at home today, found down. History of dementia, seizures, stroke, lung cancer. EXAM: CT HEAD WITHOUT CONTRAST CT CERVICAL SPINE WITHOUT CONTRAST TECHNIQUE: Multidetector CT imaging of the head and cervical spine was performed following the standard protocol without intravenous contrast. Multiplanar CT image reconstructions of the cervical spine were also generated. COMPARISON:  CT HEAD and cervical spine September 07, 2017 and CT HEAD January 05, 2019. FINDINGS: CT HEAD FINDINGS BRAIN: No intraparenchymal hemorrhage, mass effect nor midline shift. No parenchymal brain volume loss for age. No hydrocephalus. Old LEFT thalamus lacunar infarct. Biparietal encephalomalacia again noted. Old small LEFT > RIGHT cerebellar infarcts. No acute large vascular territory infarct. Patchy supratentorial white matter hypodensities compatible with mild  chronic small vessel ischemic changes. No abnormal extra-axial fluid collections. Basal cisterns are patent. VASCULAR: Mild calcific atherosclerosis of the carotid siphons. SKULL: No skull fracture.  Small parietal scalp hematomas. SINUSES/ORBITS: Mild paranasal sinus mucosal thickening without air-fluid levels. New LEFT mastoid effusion.The included ocular globes and orbital contents are non-suspicious. Status post bilateral ocular lens implants. OTHER: None. CT CERVICAL SPINE FINDINGS ALIGNMENT: Reversed cervical lordosis at C5-6, similar. Minimal grade 1 C4-5 anterolisthesis. SKULL BASE AND VERTEBRAE: Cervical vertebral bodies and posterior elements are intact. Severe C5-6 disc height loss and endplate spurring compatible with degenerative disc, unchanged. Severe LEFT upper cervical facet arthropathy. No destructive bony lesions. SOFT TISSUES AND SPINAL CANAL: Nonacute. DISC LEVELS: No high-grade osseous canal stenosis. Severe C3-4, RIGHT C5-6 neural foraminal narrowing. UPPER CHEST: Biapical bullous changes. LEFT cardiac pacemaker leads. OTHER: None. IMPRESSION: CT HEAD: 1. No acute intracranial process.  Small parietal scalp hematomas. 2. Stable examination including old biparietal, LEFT thalamus and cerebellar infarcts. CT CERVICAL SPINE: 1. No acute fracture. Stable grade 1 C4-5 anterolisthesis on a  degenerative basis. 2. Severe C3-4 and C5-6 neural foraminal narrowing. Electronically Signed   By: Elon Alas M.D.   On: 03/02/2019 19:24   Ct Cervical Spine Wo Contrast  Result Date: 03/02/2019 CLINICAL DATA:  [place] at home today, found down. History of dementia, seizures, stroke, lung cancer. EXAM: CT HEAD WITHOUT CONTRAST CT CERVICAL SPINE WITHOUT CONTRAST TECHNIQUE: Multidetector CT imaging of the head and cervical spine was performed following the standard protocol without intravenous contrast. Multiplanar CT image reconstructions of the cervical spine were also generated. COMPARISON:  CT HEAD and  cervical spine September 07, 2017 and CT HEAD January 05, 2019. FINDINGS: CT HEAD FINDINGS BRAIN: No intraparenchymal hemorrhage, mass effect nor midline shift. No parenchymal brain volume loss for age. No hydrocephalus. Old LEFT thalamus lacunar infarct. Biparietal encephalomalacia again noted. Old small LEFT > RIGHT cerebellar infarcts. No acute large vascular territory infarct. Patchy supratentorial white matter hypodensities compatible with mild chronic small vessel ischemic changes. No abnormal extra-axial fluid collections. Basal cisterns are patent. VASCULAR: Mild calcific atherosclerosis of the carotid siphons. SKULL: No skull fracture.  Small parietal scalp hematomas. SINUSES/ORBITS: Mild paranasal sinus mucosal thickening without air-fluid levels. New LEFT mastoid effusion.The included ocular globes and orbital contents are non-suspicious. Status post bilateral ocular lens implants. OTHER: None. CT CERVICAL SPINE FINDINGS ALIGNMENT: Reversed cervical lordosis at C5-6, similar. Minimal grade 1 C4-5 anterolisthesis. SKULL BASE AND VERTEBRAE: Cervical vertebral bodies and posterior elements are intact. Severe C5-6 disc height loss and endplate spurring compatible with degenerative disc, unchanged. Severe LEFT upper cervical facet arthropathy. No destructive bony lesions. SOFT TISSUES AND SPINAL CANAL: Nonacute. DISC LEVELS: No high-grade osseous canal stenosis. Severe C3-4, RIGHT C5-6 neural foraminal narrowing. UPPER CHEST: Biapical bullous changes. LEFT cardiac pacemaker leads. OTHER: None. IMPRESSION: CT HEAD: 1. No acute intracranial process.  Small parietal scalp hematomas. 2. Stable examination including old biparietal, LEFT thalamus and cerebellar infarcts. CT CERVICAL SPINE: 1. No acute fracture. Stable grade 1 C4-5 anterolisthesis on a degenerative basis. 2. Severe C3-4 and C5-6 neural foraminal narrowing. Electronically Signed   By: Elon Alas M.D.   On: 03/02/2019 19:24   Dg Hip Unilat W Or  Wo Pelvis 2-3 Views Left  Result Date: 03/02/2019 CLINICAL DATA:  Left hip pain after unwitnessed fall. EXAM: DG HIP (WITH OR WITHOUT PELVIS) 2-3V LEFT COMPARISON:  Pelvic radiograph 09/07/2017, femur radiograph 09/09/2017 FINDINGS: Intramedullary rod with trans trochanteric screw fixation of prior intertrochanteric femur fracture. Surgical fixation of the distal femoral shaft with broken proximal most screw is only partially included but unchanged from prior exam. No evidence of acute fracture. Pubic rami are intact. Pubic symphysis is congruent. The bones are under mineralized. IMPRESSION: No acute fracture or subluxation of the pelvis or left hip. Intact left proximal femur fixation hardware. Electronically Signed   By: Keith Rake M.D.   On: 03/02/2019 19:52   Scheduled Meds: . [START ON 03/04/2019] atorvastatin  80 mg Oral Q M,W,F  . cholecalciferol  2,000 Units Oral Daily  . finasteride  5 mg Oral Daily  . furosemide  40 mg Intravenous Q12H  . metoprolol tartrate  37.5 mg Oral BID  . mirabegron ER  50 mg Oral Daily  . sodium chloride flush  3 mL Intravenous Q12H  . tamsulosin  0.8 mg Oral Daily  . warfarin  4 mg Oral Once  . Warfarin - Pharmacist Dosing Inpatient   Does not apply q1800   Continuous Infusions: . sodium chloride      Active  Problems:   Fall   Time spent:   Irwin Brakeman, MD [facility] 03/03/2019, 2:28 PM    LOS: 1 day  How to contact the [facility] Attending or Consulting provider [place] or covering provider during after hours [place], for this patient?  1. Check the care team in [facility] and look for a) attending/consulting [facility] provider listed and b) the [facility] team listed 2. Log into [email or web address] and use [place]'s universal password to access. If you do not have the password, please contact the hospital operator. 3. Locate the [facility] provider you are looking for under [facility] and page to a number that you can be directly reached. 4. If you still have  difficulty reaching the provider, please page the [facility] (Director on Call) for the Hospitalists listed on amion for assistance.

## 2019-03-04 DIAGNOSIS — J449 Chronic obstructive pulmonary disease, unspecified: Secondary | ICD-10-CM

## 2019-03-04 DIAGNOSIS — Z86718 Personal history of other venous thrombosis and embolism: Secondary | ICD-10-CM

## 2019-03-04 DIAGNOSIS — Z8679 Personal history of other diseases of the circulatory system: Secondary | ICD-10-CM

## 2019-03-04 LAB — PROTIME-INR
INR: 2.2 — ABNORMAL HIGH (ref 0.8–1.2)
Prothrombin Time: 23.9 seconds — ABNORMAL HIGH (ref 11.4–15.2)

## 2019-03-04 LAB — URINE CULTURE
Culture: NO GROWTH
Special Requests: NORMAL

## 2019-03-04 LAB — HIV ANTIBODY (ROUTINE TESTING W REFLEX): HIV Screen 4th Generation wRfx: NONREACTIVE

## 2019-03-04 MED ORDER — WARFARIN SODIUM 1 MG PO TABS
1.0000 mg | ORAL_TABLET | Freq: Once | ORAL | Status: AC
  Administered 2019-03-04: 1 mg via ORAL
  Filled 2019-03-04: qty 1

## 2019-03-04 MED ORDER — POTASSIUM CHLORIDE CRYS ER 20 MEQ PO TBCR
40.0000 meq | EXTENDED_RELEASE_TABLET | Freq: Once | ORAL | Status: AC
  Administered 2019-03-06: 40 meq via ORAL
  Filled 2019-03-04: qty 2

## 2019-03-04 NOTE — Care Management Important Message (Signed)
Important Message  Patient Details  Name: Travis Cruz MRN: [id number] Date of Birth: 08/02/1946   Medicare Important Message Given:  Yes    Tommy Medal 03/04/2019, 3:00 PM

## 2019-03-04 NOTE — Progress Notes (Signed)
PROGRESS NOTE  Travis Cruz  IHK:[id number]  DOB: 04/29/1946  DOA: 03/02/2019 PCP: Dettinger, Fransisca Kaufmann, MD   Brief Admission Hx: 73 y.o. male with dementia and history of Coronary artery disease, ventricular fibrillation arrest in 2014 status post ICD placement, ischemic cardiomyopathy, chronic diastolic heart failure, lung cancer status post radiation treatment, COPD, obstructive sleep apnea on CPAP came to ED after patient fell at home.  MDM/Assessment & Plan:   1. Fall with possible syncope - unfortunately patient has not been able to recall circumstances leading to event and working him up for syncope given his complex cardiac history.  Appreciate assistance of [facility] team.  No further cardiac workup recommended. Echo is stable and pacer interrogation stable.  Seen by physical therapy with recommendations for skilled nursing facility placement 2. Acute on chronic diastolic CHF - He has been diuresing with IV Lasix.  Continues to complain of some lower extremity edema.  Continue current treatments.  Recheck labs in a.m.  Possible transition to oral diuretic therapy in the morning. 3. History of cardiac arrest - Pt is s/p St Jude ICU placement.  4. HTN - stable and controlled.  Following.  5. Dementia - wife remains at bedside for reassurance and guidance.  6. Hyperlipidemia - resume home atorvastatin.  7. Chronic anticoagulation - history of DVT/PE - continue warfarin per pharmacy.   DVT prophylaxis: warfarin  Code Status: Full  Family Communication: No family present Disposition Plan: Skilled nursing facility placement on discharge   Consultants:  cardiology  Procedures:    Antimicrobials:     Subjective: No dizziness or lightheadedness.  Overall breathing is better.  Still has some lower extremity edema.  Objective: Vitals:   03/03/19 2100 03/03/19 2211 03/04/19 0536 03/04/19 1332  BP:  102/78 119/78 103/66  Pulse:  (!) 103 97 81  Resp:  16 15 (!) 22    Temp:  98.3 F (36.8 C) 98.1 F (36.7 C) 97.8 F (36.6 C)  TempSrc:  Oral Oral Oral  SpO2: 94% 95% 94% 96%  Weight:      Height:        Intake/Output Summary (Last 24 hours) at 03/04/2019 1824 Last data filed at 03/04/2019 1300 Gross per 24 hour  Intake 600 ml  Output 475 ml  Net 125 ml   Filed Weights   03/02/19 1754 03/03/19 0019  Weight: 77.1 kg 98.9 kg   REVIEW OF SYSTEMS  As per history otherwise all reviewed and reported negative  Exam:  General exam: Alert, awake, oriented x 3 Respiratory system: Clear to auscultation. Respiratory effort normal. Cardiovascular system:RRR. No murmurs, rubs, gallops. Gastrointestinal system: Abdomen is nondistended, soft and nontender. No organomegaly or masses felt. Normal bowel sounds heard. Central nervous system: Alert and oriented. No focal neurological deficits. Extremities: 1+ edema bilaterally Skin: No rashes, lesions or ulcers Psychiatry: Judgement and insight appear normal. Mood & affect appropriate.    Data Reviewed: Basic Metabolic Panel: Recent Labs  Lab 03/02/19 1816 03/03/19 0634  NA 139 139  K 3.8 3.4*  CL 103 106  CO2 26 24  GLUCOSE 102* 112*  BUN 14 10  CREATININE 0.87 0.75  CALCIUM 9.0 8.1*   Liver Function Tests: Recent Labs  Lab 03/02/19 1816 03/03/19 0634  AST 39 35  ALT 28 27  ALKPHOS 209* 186*  BILITOT 0.5 1.2  PROT 7.8 7.0  ALBUMIN 4.0 3.6   No results for input(s): LIPASE, AMYLASE in the last 168 hours. No results for input(s): AMMONIA in  the last 168 hours. CBC: Recent Labs  Lab 03/02/19 1816 03/03/19 0634  WBC 19.7* 17.5*  NEUTROABS 15.5*  --   HGB 16.3 15.6  HCT 52.9* 50.1  MCV 89.8 89.8  PLT 356 337   Cardiac Enzymes: Recent Labs  Lab 03/02/19 1816 03/03/19 0033 03/03/19 0634 03/03/19 1204  CKTOTAL 278  --   --   --   TROPONINI 0.05* 0.05* 0.04* 0.03*   CBG (last 3)  No results for input(s): GLUCAP in the last 72 hours. Recent Results (from the past 240 hour(s))   Urine culture     Status: None   Collection Time: 03/02/19  6:16 PM  Result Value Ref Range Status   Specimen Description   Final    URINE, CLEAN CATCH Performed at [facility], [address], [place], [place] [address]    Special Requests   Final    Normal Performed at [facility], [address], [place], [place] [address]    Culture   Final    NO GROWTH Performed at [facility], [place] [address] [place], [place] [address]    Report Status 03/04/2019 FINAL  Final     Studies: Dg Chest 2 View  Result Date: 03/02/2019 CLINICAL DATA:  Syncope and fall. Unwitnessed fall. Shortness of breath. EXAM: CHEST - 2 VIEW COMPARISON:  CT 02/25/2019 FINDINGS: Pacemaker in place with battery pack overlying the right chest, with the left subclavian venous approach. The heart is enlarged. Unchanged mediastinal contours. Perivascular haziness and peribronchial cuffing left perihilar scarring as seen on CT. No large pleural effusion. No pneumothorax. Bones are under mineralized. Remote left lower rib fractures. No acute osseous abnormalities are seen. IMPRESSION: 1. Cardiomegaly with vascular congestion and probable mild edema. 2. Left perihilar scarring. Electronically Signed   By: Keith Rake M.D.   On: 03/02/2019 19:49   Dg Elbow Complete Left  Result Date: 03/02/2019 CLINICAL DATA:  Left elbow pain after unwitnessed fall. EXAM: LEFT ELBOW - COMPLETE 3+ VIEW COMPARISON:  None. FINDINGS: There is no evidence of fracture, dislocation, or joint effusion. Trace osteoarthritis of the ulnohumeral joint with spurring. Joint spaces are maintained. Soft tissues are unremarkable. IMPRESSION: No fracture or subluxation of the left elbow. Electronically Signed   By: Keith Rake M.D.   On: 03/02/2019 19:50   Ct Head Wo Contrast  Result Date: 03/02/2019 CLINICAL DATA:  [place] at home today, found down. History of dementia, seizures, stroke, lung cancer. EXAM: CT HEAD WITHOUT CONTRAST CT CERVICAL SPINE  WITHOUT CONTRAST TECHNIQUE: Multidetector CT imaging of the head and cervical spine was performed following the standard protocol without intravenous contrast. Multiplanar CT image reconstructions of the cervical spine were also generated. COMPARISON:  CT HEAD and cervical spine September 07, 2017 and CT HEAD January 05, 2019. FINDINGS: CT HEAD FINDINGS BRAIN: No intraparenchymal hemorrhage, mass effect nor midline shift. No parenchymal brain volume loss for age. No hydrocephalus. Old LEFT thalamus lacunar infarct. Biparietal encephalomalacia again noted. Old small LEFT > RIGHT cerebellar infarcts. No acute large vascular territory infarct. Patchy supratentorial white matter hypodensities compatible with mild chronic small vessel ischemic changes. No abnormal extra-axial fluid collections. Basal cisterns are patent. VASCULAR: Mild calcific atherosclerosis of the carotid siphons. SKULL: No skull fracture.  Small parietal scalp hematomas. SINUSES/ORBITS: Mild paranasal sinus mucosal thickening without air-fluid levels. New LEFT mastoid effusion.The included ocular globes and orbital contents are non-suspicious. Status post bilateral ocular lens implants. OTHER: None. CT CERVICAL SPINE FINDINGS ALIGNMENT: Reversed cervical  lordosis at C5-6, similar. Minimal grade 1 C4-5 anterolisthesis. SKULL BASE AND VERTEBRAE: Cervical vertebral bodies and posterior elements are intact. Severe C5-6 disc height loss and endplate spurring compatible with degenerative disc, unchanged. Severe LEFT upper cervical facet arthropathy. No destructive bony lesions. SOFT TISSUES AND SPINAL CANAL: Nonacute. DISC LEVELS: No high-grade osseous canal stenosis. Severe C3-4, RIGHT C5-6 neural foraminal narrowing. UPPER CHEST: Biapical bullous changes. LEFT cardiac pacemaker leads. OTHER: None. IMPRESSION: CT HEAD: 1. No acute intracranial process.  Small parietal scalp hematomas. 2. Stable examination including old biparietal, LEFT thalamus and  cerebellar infarcts. CT CERVICAL SPINE: 1. No acute fracture. Stable grade 1 C4-5 anterolisthesis on a degenerative basis. 2. Severe C3-4 and C5-6 neural foraminal narrowing. Electronically Signed   By: Elon Alas M.D.   On: 03/02/2019 19:24   Ct Cervical Spine Wo Contrast  Result Date: 03/02/2019 CLINICAL DATA:  [place] at home today, found down. History of dementia, seizures, stroke, lung cancer. EXAM: CT HEAD WITHOUT CONTRAST CT CERVICAL SPINE WITHOUT CONTRAST TECHNIQUE: Multidetector CT imaging of the head and cervical spine was performed following the standard protocol without intravenous contrast. Multiplanar CT image reconstructions of the cervical spine were also generated. COMPARISON:  CT HEAD and cervical spine September 07, 2017 and CT HEAD January 05, 2019. FINDINGS: CT HEAD FINDINGS BRAIN: No intraparenchymal hemorrhage, mass effect nor midline shift. No parenchymal brain volume loss for age. No hydrocephalus. Old LEFT thalamus lacunar infarct. Biparietal encephalomalacia again noted. Old small LEFT > RIGHT cerebellar infarcts. No acute large vascular territory infarct. Patchy supratentorial white matter hypodensities compatible with mild chronic small vessel ischemic changes. No abnormal extra-axial fluid collections. Basal cisterns are patent. VASCULAR: Mild calcific atherosclerosis of the carotid siphons. SKULL: No skull fracture.  Small parietal scalp hematomas. SINUSES/ORBITS: Mild paranasal sinus mucosal thickening without air-fluid levels. New LEFT mastoid effusion.The included ocular globes and orbital contents are non-suspicious. Status post bilateral ocular lens implants. OTHER: None. CT CERVICAL SPINE FINDINGS ALIGNMENT: Reversed cervical lordosis at C5-6, similar. Minimal grade 1 C4-5 anterolisthesis. SKULL BASE AND VERTEBRAE: Cervical vertebral bodies and posterior elements are intact. Severe C5-6 disc height loss and endplate spurring compatible with degenerative disc, unchanged.  Severe LEFT upper cervical facet arthropathy. No destructive bony lesions. SOFT TISSUES AND SPINAL CANAL: Nonacute. DISC LEVELS: No high-grade osseous canal stenosis. Severe C3-4, RIGHT C5-6 neural foraminal narrowing. UPPER CHEST: Biapical bullous changes. LEFT cardiac pacemaker leads. OTHER: None. IMPRESSION: CT HEAD: 1. No acute intracranial process.  Small parietal scalp hematomas. 2. Stable examination including old biparietal, LEFT thalamus and cerebellar infarcts. CT CERVICAL SPINE: 1. No acute fracture. Stable grade 1 C4-5 anterolisthesis on a degenerative basis. 2. Severe C3-4 and C5-6 neural foraminal narrowing. Electronically Signed   By: Elon Alas M.D.   On: 03/02/2019 19:24   Dg Hip Unilat W Or Wo Pelvis 2-3 Views Left  Result Date: 03/02/2019 CLINICAL DATA:  Left hip pain after unwitnessed fall. EXAM: DG HIP (WITH OR WITHOUT PELVIS) 2-3V LEFT COMPARISON:  Pelvic radiograph 09/07/2017, femur radiograph 09/09/2017 FINDINGS: Intramedullary rod with trans trochanteric screw fixation of prior intertrochanteric femur fracture. Surgical fixation of the distal femoral shaft with broken proximal most screw is only partially included but unchanged from prior exam. No evidence of acute fracture. Pubic rami are intact. Pubic symphysis is congruent. The bones are under mineralized. IMPRESSION: No acute fracture or subluxation of the pelvis or left hip. Intact left proximal femur fixation hardware. Electronically Signed   By: Aurther Loft.D.  On: 03/02/2019 19:52   Scheduled Meds: . atorvastatin  80 mg Oral Q M,W,F  . cholecalciferol  2,000 Units Oral Daily  . finasteride  5 mg Oral Daily  . furosemide  40 mg Intravenous Q12H  . metoprolol tartrate  37.5 mg Oral BID  . mirabegron ER  50 mg Oral Daily  . sodium chloride flush  3 mL Intravenous Q12H  . tamsulosin  0.8 mg Oral Daily  . Warfarin - Pharmacist Dosing Inpatient   Does not apply q1800   Continuous Infusions: . sodium chloride       Active Problems:   Ischemic cardiomyopathy   HLD (hyperlipidemia)   CAD (coronary atherosclerotic disease)   Implantable cardioverter-defibrillator single chamber [employer] [facility]   Obstructive sleep apnea   H/O ventricular fibrillation   COPD (chronic obstructive pulmonary disease) (HCC)   Macular degeneration, dry   History of DVT (deep vein thrombosis)   History of pulmonary embolus (PE)   Fall   Time spent: 95mins  Kathie Dike, MD [facility] 03/04/2019, 6:24 PM    LOS: 2 days

## 2019-03-04 NOTE — Clinical Social Work Placement (Signed)
   CLINICAL SOCIAL WORK PLACEMENT  NOTE  Date:  03/04/2019  Patient Details  Name: Travis Cruz MRN: [id number] Date of Birth: [birth year]  Clinical Social Work is seeking post-discharge placement for this patient at the [place] level of care (*CSW will initial, date and re-position this form in  chart as items are completed):  Yes   Patient/family provided with [place] Work Department's list of facilities offering this level of care within the geographic area requested by the patient (or if unable, by the patient's family).  Yes   Patient/family informed of their freedom to choose among providers that offer the needed level of care, that participate in Medicare, Medicaid or managed care program needed by the patient, have an available bed and are willing to accept the patient.  Yes   Patient/family informed of [facility]'s ownership interest in [facility] and [facility], as well as of the fact that they are under no obligation to receive care at these facilities.  PASRR submitted to EDS on       PASRR number received on       Existing PASRR number confirmed on 03/04/19     FL2 transmitted to all facilities in geographic area requested by pt/family on 03/04/19     FL2 transmitted to all facilities within larger geographic area on       Patient informed that his/her managed care company has contracts with or will negotiate with certain facilities, including the following:        Yes   Patient/family informed of bed offers received.  Patient chooses bed at [facility]     Physician recommends and patient chooses bed at      Patient to be transferred to   on  .  Patient to be transferred to facility by       Patient family notified on   of transfer.  Name of family member notified:        PHYSICIAN       Additional Comment:    _______________________________________________ Sherald Barge,  RN 03/04/2019, 4:14 PM

## 2019-03-04 NOTE — Evaluation (Signed)
Physical Therapy Evaluation Patient Details Name: Travis Cruz MRN: [id number] DOB: 1946/10/15 Today's Date: 03/04/2019   History of Present Illness  Travis Cruz  is a 73 y.o. male, with a history ofCoronary artery disease, ventricular fibrillation arrest in 2014 status post ICD placement, ischemic cardiomyopathy, chronic diastolic heart failure, lung cancer status post radiation treatment, COPD, obstructive sleep apnea on CPAP came to ED after patient fell at home.  Patient does not recall passing out but he is a poor historian.  As per patient's wife patient was on the floor for 2 hours and was unable to get up.  Family has noted that patient has become progressively weak over past 2 months and has been getting physical therapy as outpatient which has not helped him much.  Patient says that his legs just give out and he was unable to get up most of times when he falls.  He has ICD in place and did not notice any shocks delivered by ICD.    Clinical Impression  Patient functioning below baseline and limited for functional mobility and gait as stated below secondary to BLE weakness, fatigue, SpO2 desaturation with exertion and fair/poor standing balance.  Patient will benefit from continued physical therapy in hospital and recommended venue below to increase strength, balance, endurance for safe ADLs and gait.    Follow Up Recommendations SNF    Equipment Recommendations  None recommended by PT    Recommendations for Other Services       Precautions / Restrictions Precautions Precautions: Fall Restrictions Weight Bearing Restrictions: No      Mobility  Bed Mobility Overal bed mobility: Needs Assistance Bed Mobility: Supine to Sit     Supine to sit: Min guard     General bed mobility comments: increased time, labored movement  Transfers Overall transfer level: Needs assistance Equipment used: Rolling walker (2 wheeled) Transfers: Sit to/from [employer] Sit to Stand: Min assist Stand pivot transfers: Min assist       General transfer comment: labored movement  Ambulation/Gait Ambulation/Gait assistance: Herbalist (Feet): 35 Feet Assistive device: 4-wheeled walker;Rolling walker (2 wheeled) Gait Pattern/deviations: Decreased step length - right;Decreased step length - left;Decreased stride length Gait velocity: decreased   General Gait Details: slow labored cadence without loss of balance while on 1 LPM, with SpO2 dropping to from 92% to 88%, increased to 2 LPM to make it back to room, mostly limited due to c/o fatigue  Stairs            Wheelchair Mobility    Modified Rankin (Stroke Patients Only)       Balance Overall balance assessment: Needs assistance Sitting-balance support: Feet supported;No upper extremity supported Sitting balance-Leahy Scale: Good     Standing balance support: Bilateral upper extremity supported;During functional activity Standing balance-Leahy Scale: Fair Standing balance comment: using RW                             Pertinent Vitals/Pain Pain Assessment: No/denies pain    Home Living Family/patient expects to be discharged to:: Private residence Living Arrangements: Spouse/significant other Available Help at Discharge: Family Type of Home: House Home Access: Stairs to enter Entrance Stairs-Rails: Right;Left;Can reach both Entrance Stairs-Number of Steps: 3-4 Home Layout: One level Home Equipment: Cane - single point;Walker - 2 wheels      Prior Function Level of Independence: Independent with assistive device(s)  Comments: Household ambulator with RW     Hand Dominance   Dominant Hand: Right    Extremity/Trunk Assessment   Upper Extremity Assessment Upper Extremity Assessment: Generalized weakness    Lower Extremity Assessment Lower Extremity Assessment: Generalized weakness    Cervical / Trunk Assessment Cervical  / Trunk Assessment: Normal  Communication   Communication: No difficulties  Cognition Arousal/Alertness: Awake/alert Behavior During Therapy: WFL for tasks assessed/performed Overall Cognitive Status: Within Functional Limits for tasks assessed                                        General Comments      Exercises     Assessment/Plan    PT Assessment Patient needs continued PT services  PT Problem List Decreased strength;Decreased activity tolerance;Decreased balance;Decreased mobility       PT Treatment Interventions Gait training;Stair training;Functional mobility training;Therapeutic activities;Patient/family education;Therapeutic exercise    PT Goals (Current goals can be found in the Care Plan section)  Acute Rehab PT Goals Patient Stated Goal: return home after rehab PT Goal Formulation: With patient/family Time For Goal Achievement: 03/18/19 Potential to Achieve Goals: Good    Frequency Min 3X/week   Barriers to discharge        Co-evaluation               AM-PAC PT "6 Clicks" Mobility  Outcome Measure Help needed turning from your back to your side while in a flat bed without using bedrails?: None Help needed moving from lying on your back to sitting on the side of a flat bed without using bedrails?: A Little Help needed moving to and from a bed to a chair (including a wheelchair)?: A Little Help needed standing up from a chair using your arms (e.g., wheelchair or bedside chair)?: A Little Help needed to walk in hospital room?: A Little Help needed climbing 3-5 steps with a railing? : A Lot 6 Click Score: 18    End of Session Equipment Utilized During Treatment: Oxygen Activity Tolerance: Patient tolerated treatment well;Patient limited by fatigue Patient left: in chair;with call bell/phone within reach;with family/visitor present;with chair alarm set Nurse Communication: Mobility status PT Visit Diagnosis: Unsteadiness on feet  (R26.81);Other abnormalities of gait and mobility (R26.89);Muscle weakness (generalized) (M62.81)    Time: 5364-6803 PT Time Calculation (min) (ACUTE ONLY): 27 min   Charges:   PT Evaluation $PT Eval Moderate Complexity: 1 Mod PT Treatments $Therapeutic Activity: 23-37 mins        12:05 PM, 03/04/19 [name], MPT Physical Therapist with [facility] [phone] office [phone] mobile phone

## 2019-03-04 NOTE — NC FL2 (Signed)
[place] LEVEL OF CARE SCREENING TOOL     IDENTIFICATION  Patient Name: Travis Cruz Birthdate: Feb 01, 1946 Sex: male Admission Date (Current Location): 03/02/2019  [facility] and [place] Number:  [employer] and Address:  [facility] [address], [place]      Provider Number: [id number]  Attending Physician Name and Address:  Kathie Dike, MD  Relative Name and Phone Number:  Zeph Riebel [phone]    Current Level of Care: Hospital Recommended Level of Care: [place] Prior Approval Number:    Date Approved/Denied:   PASRR Number: [id number]  Discharge Plan: SNF    Current Diagnoses: Patient Active Problem List   Diagnosis Date Noted  . Fall 03/02/2019  . Senile purpura ([place]) 11/02/2017  . Preoperative cardiovascular examination   . Acute diastolic CHF (congestive heart failure), NYHA class 3 ([place])   . Closed left hip fracture, initial encounter ([place])   . Supratherapeutic INR   . Hip fracture requiring operative repair ([place]) 09/07/2017  . Hip fracture ([place]) 09/07/2017  . Abrasion, right knee, initial encounter   . Forehead contusion   . Cancer of bronchus of right lower lobe ([place]) 02/25/2017  . History of DVT (deep vein thrombosis) 12/04/2016  . History of pulmonary embolus (PE) 12/04/2016  . History of CVA (cerebrovascular accident) 12/04/2016  . Macular degeneration, dry 12/20/2015  . Cellulitis and abscess 09/27/2015  . COPD (chronic obstructive pulmonary disease) ([place])   . Implantable cardioverter-defibrillator single chamber [employer] [facility]es 10/05/2013  . Malignant neoplasm of upper lobe of left lung ([place]) 09/16/2013  . Abnormal chest x-ray 05/13/2013  . HLD (hyperlipidemia) 05/13/2013  . CAD (coronary atherosclerotic disease) 05/13/2013  . Transient ischemic attack (TIA), and cerebral infarction without residual deficits 03/28/2013  . H/O ventricular fibrillation 03/28/2013  . Ischemic  cardiomyopathy 03/15/2013  . NSTEMI (non-ST elevated myocardial infarction) ([place]) 01/27/2013  . Obstructive sleep apnea 03/10/2012  . Patent arterial duct 03/10/2012  . HTN (hypertension) 03/24/2011  . Deep vein thrombosis (DVT) ([place]) 09/23/2008    Orientation RESPIRATION BLADDER Height & Weight     Self, Situation  O2(2lpm [place]) Continent Weight: 98.9 kg Height:  5\' 9"  (175.3 cm)  BEHAVIORAL SYMPTOMS/MOOD NEUROLOGICAL BOWEL NUTRITION STATUS      Continent Diet(heart healthy)  AMBULATORY STATUS COMMUNICATION OF NEEDS Skin   Limited Assist Verbally Normal                       Personal Care Assistance Level of Assistance  Bathing, Feeding, Dressing Bathing Assistance: Limited assistance Feeding assistance: Independent Dressing Assistance: Limited assistance     Functional Limitations Info  Hearing   Hearing Info: Impaired      SPECIAL CARE FACTORS FREQUENCY  PT (By licensed PT)     PT Frequency: 5x/week              Contractures Contractures Info: Not present    Additional Factors Info  Allergies   Allergies Info: ativan           Current Medications (03/04/2019):  This is the current hospital active medication list Current Facility-Administered Medications  Medication Dose Route Frequency Provider Last Rate Last Dose  . 0.9 %  sodium chloride infusion  250 mL Intravenous PRN Oswald Hillock, MD      . acetaminophen (TYLENOL) tablet 650 mg  650 mg Oral Q6H PRN Oswald Hillock, MD       Or  .  acetaminophen (TYLENOL) suppository 650 mg  650 mg Rectal Q6H PRN Oswald Hillock, MD      . atorvastatin (LIPITOR) tablet 80 mg  80 mg Oral Q M,W,F Oswald Hillock, MD   80 mg at 03/04/19 0912  . cholecalciferol (VITAMIN D3) tablet 2,000 Units  2,000 Units Oral Daily Oswald Hillock, MD   2,000 Units at 03/04/19 0912  . finasteride (PROSCAR) tablet 5 mg  5 mg Oral Daily Oswald Hillock, MD   5 mg at 03/04/19 0912  . furosemide (LASIX) injection 40 mg  40 mg Intravenous Q12H  Oswald Hillock, MD   40 mg at 03/04/19 0659  . metoprolol tartrate (LOPRESSOR) tablet 37.5 mg  37.5 mg Oral BID Oswald Hillock, MD   37.5 mg at 03/04/19 0913  . mirabegron ER (MYRBETRIQ) tablet 50 mg  50 mg Oral Daily Oswald Hillock, MD   50 mg at 03/04/19 0913  . sodium chloride flush (NS) 0.9 % injection 3 mL  3 mL Intravenous Q12H Oswald Hillock, MD   3 mL at 03/04/19 0914  . sodium chloride flush (NS) 0.9 % injection 3 mL  3 mL Intravenous PRN Oswald Hillock, MD      . tamsulosin (FLOMAX) capsule 0.8 mg  0.8 mg Oral Daily Oswald Hillock, MD   0.8 mg at 03/04/19 0913  . warfarin (COUMADIN) tablet 1 mg  1 mg Oral Once Kathie Dike, MD      . Warfarin - Pharmacist Dosing Inpatient   Does not apply [id number] Oswald Hillock, MD         Discharge Medications: Please see discharge summary for a list of discharge medications.  Relevant Imaging Results:  Relevant Lab Results:   Additional Information SSN: [id number]  Roda Shutters Margretta Sidle, RN

## 2019-03-04 NOTE — Care Management (Signed)
Pt from home with wife. Pt's wife works full time. Pt usually uses RW and is okay at home during the day while wife at work. Pt has continued to fall more and more at home and become increasingly weakened. PT recommends SNF and pt/wife agreeable. Pt referral faxed to [place] and [place] per pt/wife choice.
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
## 2019-03-04 NOTE — Plan of Care (Signed)
  Problem: Acute Rehab PT Goals(only PT should resolve) Goal: Pt Will Go Supine/Side To Sit Outcome: Progressing Flowsheets (Taken 03/04/2019 1207) Pt will go Supine/Side to Sit: with modified independence Goal: Patient Will Transfer Sit To/From Stand Outcome: Progressing Flowsheets (Taken 03/04/2019 1207) Patient will transfer sit to/from stand: with min guard assist Goal: Pt Will Transfer Bed To Chair/Chair To Bed Outcome: Progressing Flowsheets (Taken 03/04/2019 1207) Pt will Transfer Bed to Chair/Chair to Bed: min guard assist Goal: Pt Will Ambulate Outcome: Progressing Flowsheets (Taken 03/04/2019 1207) Pt will Ambulate: 75 feet; with min guard assist; with rolling walker   12:07 PM, 03/04/19 [name], MPT Physical Therapist with [facility] [phone] office [phone] mobile phone

## 2019-03-05 LAB — BASIC METABOLIC PANEL
Anion gap: 8 (ref 5–15)
BUN: 16 mg/dL (ref 8–23)
CO2: 27 mmol/L (ref 22–32)
Calcium: 8.2 mg/dL — ABNORMAL LOW (ref 8.9–10.3)
Chloride: 102 mmol/L (ref 98–111)
Creatinine, Ser: 0.74 mg/dL (ref 0.61–1.24)
GFR calc Af Amer: >60 mL/min (ref >60)
GFR calc non Af Amer: >60 mL/min (ref >60)
Glucose, Bld: 104 mg/dL — ABNORMAL HIGH (ref 70–99)
Potassium: 4.1 mmol/L (ref 3.5–5.1)
Sodium: 137 mmol/L (ref 135–145)

## 2019-03-05 LAB — PROTIME-INR
INR: 2.3 — ABNORMAL HIGH (ref 0.8–1.2)
Prothrombin Time: 24.6 seconds — ABNORMAL HIGH (ref 11.4–15.2)

## 2019-03-05 MED ORDER — WARFARIN SODIUM 2 MG PO TABS
2.0000 mg | ORAL_TABLET | Freq: Once | ORAL | Status: AC
  Administered 2019-03-05: 2 mg via ORAL
  Filled 2019-03-05: qty 1

## 2019-03-05 MED ORDER — UMECLIDINIUM BROMIDE 62.5 MCG/INH IN AEPB
1.0000 | INHALATION_SPRAY | Freq: Every day | RESPIRATORY_TRACT | Status: DC
  Administered 2019-03-06 – 2019-03-07 (×2): 1 via RESPIRATORY_TRACT
  Filled 2019-03-05: qty 7

## 2019-03-05 MED ORDER — TRAMADOL HCL 50 MG PO TABS: 50.0000 mg | ORAL_TABLET | Freq: Three times a day (TID) | ORAL | Status: DC | PRN

## 2019-03-05 MED ORDER — FUROSEMIDE 40 MG PO TABS
40.0000 mg | ORAL_TABLET | Freq: Every day | ORAL | Status: DC
  Administered 2019-03-05 – 2019-03-07 (×2): 40 mg via ORAL
  Filled 2019-03-05 (×3): qty 1

## 2019-03-05 NOTE — Progress Notes (Signed)
Received call back from [facility]  Patients room number is 107  Call report number is [place] LCSW [phone]

## 2019-03-05 NOTE — Progress Notes (Signed)
Patient is not able to come due to  insurance auth not being in. This will result in a delay for patient to discharge. LCSW spoke to charge nurse.  [employer] LCSW [phone]

## 2019-03-05 NOTE — Progress Notes (Signed)
PROGRESS NOTE  Travis Cruz  IRS:[id number]  DOB: 04/17/1946  DOA: 03/02/2019 PCP: Dettinger, Fransisca Kaufmann, MD   Brief Admission Hx: 73 y.o. male with dementia and history of Coronary artery disease, ventricular fibrillation arrest in 2014 status post ICD placement, ischemic cardiomyopathy, chronic diastolic heart failure, lung cancer status post radiation treatment, COPD, obstructive sleep apnea on CPAP came to ED after patient fell at home.  MDM/Assessment & Plan:   1. Fall with possible syncope - unfortunately patient has not been able to recall circumstances leading to event and working him up for syncope given his complex cardiac history.  Appreciate assistance of [facility] team.  No further cardiac workup recommended. Echo is stable and pacer interrogation stable.  Seen by physical therapy with recommendations for skilled nursing facility placement 2. Acute on chronic diastolic CHF - He has been diuresing with IV Lasix.  Appears to be approaching euvolemia.  Will transition to oral Lasix 3. History of cardiac arrest - Pt is s/p [facility] Jude ICU placement.  4. HTN - stable and controlled.  Following.  5. Dementia - wife remains at bedside for reassurance and guidance.  6. Hyperlipidemia - resume home atorvastatin.  7. Chronic anticoagulation - history of DVT/PE - continue warfarin per pharmacy.  8. Obstructive sleep apnea.  Family will bring in patient's home BiPAP machine.  DVT prophylaxis: warfarin  Code Status: Full  Family Communication: No family present Disposition Plan: Patient is stable and is awaiting skilled nursing facility placement   Consultants:  cardiology  Procedures:    Antimicrobials:     Subjective: Feels her breathing is better.  Currently breathing comfortably on 2 L of oxygen.  No chest pain.  Objective: Vitals:   03/04/19 0536 03/04/19 1332 03/04/19 2114 03/05/19 0459  BP: 119/78 103/66 99/74 115/80  Pulse: 97 81 94 83  Resp: 15 (!) 22 18 18     Temp: 98.1 F (36.7 C) 97.8 F (36.6 C) 98 F (36.7 C) (!) 97.4 F (36.3 C)  TempSrc: Oral Oral Oral Oral  SpO2: 94% 96% 98% 97%  Weight:      Height:        Intake/Output Summary (Last 24 hours) at 03/05/2019 1752 Last data filed at 03/05/2019 1500 Gross per 24 hour  Intake 6 ml  Output 350 ml  Net -344 ml   Filed Weights   03/02/19 1754 03/03/19 0019  Weight: 77.1 kg 98.9 kg   REVIEW OF SYSTEMS  As per history otherwise all reviewed and reported negative  Exam:  General exam: Alert, awake, oriented x 3 Respiratory system: Clear to auscultation. Respiratory effort normal. Cardiovascular system:RRR. No murmurs, rubs, gallops. Gastrointestinal system: Abdomen is nondistended, soft and nontender. No organomegaly or masses felt. Normal bowel sounds heard. Central nervous system: Alert and oriented. No focal neurological deficits. Extremities: 1+ edema bilaterally Skin: No rashes, lesions or ulcers Psychiatry: Judgement and insight appear normal. Mood & affect appropriate.     Data Reviewed: Basic Metabolic Panel: Recent Labs  Lab 03/02/19 1816 03/03/19 0634 03/05/19 0644  NA 139 139 137  K 3.8 3.4* 4.1  CL 103 106 102  CO2 26 24 27   GLUCOSE 102* 112* 104*  BUN 14 10 16   CREATININE 0.87 0.75 0.74  CALCIUM 9.0 8.1* 8.2*   Liver Function Tests: Recent Labs  Lab 03/02/19 1816 03/03/19 0634  AST 39 35  ALT 28 27  ALKPHOS 209* 186*  BILITOT 0.5 1.2  PROT 7.8 7.0  ALBUMIN 4.0 3.6  No results for input(s): LIPASE, AMYLASE in the last 168 hours. No results for input(s): AMMONIA in the last 168 hours. CBC: Recent Labs  Lab 03/02/19 1816 03/03/19 0634  WBC 19.7* 17.5*  NEUTROABS 15.5*  --   HGB 16.3 15.6  HCT 52.9* 50.1  MCV 89.8 89.8  PLT 356 337   Cardiac Enzymes: Recent Labs  Lab 03/02/19 1816 03/03/19 0033 03/03/19 0634 03/03/19 1204  CKTOTAL 278  --   --   --   TROPONINI 0.05* 0.05* 0.04* 0.03*   CBG (last 3)  No results for input(s):  GLUCAP in the last 72 hours. Recent Results (from the past 240 hour(s))  Urine culture     Status: None   Collection Time: 03/02/19  6:16 PM  Result Value Ref Range Status   Specimen Description   Final    URINE, CLEAN CATCH Performed at [facility], [address], [place], [place] [address]    Special Requests   Final    Normal Performed at [facility], [address] [place], [place] [address]    Culture   Final    NO GROWTH Performed at [facility], [place] [address], [place], [place] [address]    Report Status 03/04/2019 FINAL  Final     Studies: No results found. Scheduled Meds: . atorvastatin  80 mg Oral Q M,W,F  . cholecalciferol  2,000 Units Oral Daily  . finasteride  5 mg Oral Daily  . furosemide  40 mg Oral Daily  . metoprolol tartrate  37.5 mg Oral BID  . mirabegron ER  50 mg Oral Daily  . potassium chloride  40 mEq Oral Once  . sodium chloride flush  3 mL Intravenous Q12H  . tamsulosin  0.8 mg Oral Daily  . umeclidinium bromide  1 puff Inhalation Daily  . Warfarin - Pharmacist Dosing Inpatient   Does not apply q1800   Continuous Infusions: . sodium chloride      Active Problems:   Ischemic cardiomyopathy   HLD (hyperlipidemia)   CAD (coronary atherosclerotic disease)   Implantable cardioverter-defibrillator single chamber [employer] [facility]   Obstructive sleep apnea   H/O ventricular fibrillation   COPD (chronic obstructive pulmonary disease) (HCC)   Macular degeneration, dry   History of DVT (deep vein thrombosis)   History of pulmonary embolus (PE)   Fall   Time spent: 21mins  Kathie Dike, MD [facility] 03/05/2019, 5:52 PM    LOS: 3 days

## 2019-03-05 NOTE — Progress Notes (Signed)
LCSW called [facility]  [phone] and left a detailed message that I require a room number and call report number. Awaiting a call back   [place] LCSW [phone]
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
## 2019-03-06 DIAGNOSIS — Z86711 Personal history of pulmonary embolism: Secondary | ICD-10-CM

## 2019-03-06 DIAGNOSIS — E785 Hyperlipidemia, unspecified: Secondary | ICD-10-CM

## 2019-03-06 LAB — PROTIME-INR
INR: 2.3 — ABNORMAL HIGH (ref 0.8–1.2)
Prothrombin Time: 25 seconds — ABNORMAL HIGH (ref 11.4–15.2)

## 2019-03-06 MED ORDER — WARFARIN SODIUM 2 MG PO TABS
2.0000 mg | ORAL_TABLET | Freq: Once | ORAL | Status: AC
  Administered 2019-03-06: 2 mg via ORAL
  Filled 2019-03-06: qty 1

## 2019-03-06 NOTE — Progress Notes (Signed)
Pt refused lasix and flomax. Pt A&Ox3. Pt stated he was tired of peeing and did not want the medication that made him pee. Pt educated on reason for medication. Pt stated he was aware and did not want it.

## 2019-03-06 NOTE — Progress Notes (Signed)
LCSW called [facility] this afternoon and they have not received auth number yet. Weekday LCSW will follow this patient.  [employer] LCSW [phone]

## 2019-03-06 NOTE — Progress Notes (Signed)
PROGRESS NOTE  Travis Cruz  BTD:[id number]  DOB: 1946/10/19  DOA: 03/02/2019 PCP: Dettinger, Fransisca Kaufmann, MD   Brief Admission Hx: 73 y.o. male with dementia and history of Coronary artery disease, ventricular fibrillation arrest in 2014 status post ICD placement, ischemic cardiomyopathy, chronic diastolic heart failure, lung cancer status post radiation treatment, COPD, obstructive sleep apnea on CPAP came to ED after patient fell at home.  MDM/Assessment & Plan:   1. Fall with possible syncope - unfortunately patient has not been able to recall circumstances leading to event and working him up for syncope given his complex cardiac history.  Appreciate assistance of [facility] team.  No further cardiac workup recommended. Echo is stable and pacer interrogation stable.  Seen by physical therapy with recommendations for skilled nursing facility placement 2. Acute on chronic diastolic CHF -he was treated with intravenous Lasix.  Now back on oral Lasix. 3. History of cardiac arrest - Pt is s/p [facility] ICU placement.  4. HTN - stable and controlled.  Following.  5. Dementia - wife remains at bedside for reassurance and guidance.  6. Hyperlipidemia - resume home atorvastatin.  7. Chronic anticoagulation - history of DVT/PE - continue warfarin per pharmacy.  8. Obstructive sleep apnea.  Family will bring in patient's home BiPAP machine.  DVT prophylaxis: warfarin  Code Status: Full  Family Communication: No family present Disposition Plan: Patient is stable and is awaiting skilled nursing facility placement   Consultants:  cardiology  Procedures:    Antimicrobials:     Subjective: Feels better today.  No chest pain or shortness of breath.  Objective: Vitals:   03/05/19 2125 03/06/19 0444 03/06/19 0742 03/06/19 1400  BP: 115/77 113/74  109/77  Pulse: 88 83  88  Resp: 16   18  Temp: 98.8 F (37.1 C)   98.6 F (37 C)  TempSrc: Oral     SpO2: 96% 97% 93% 93%  Weight:       Height:        Intake/Output Summary (Last 24 hours) at 03/06/2019 1930 Last data filed at 03/06/2019 1710 Gross per 24 hour  Intake 963 ml  Output 950 ml  Net 13 ml   Filed Weights   03/02/19 1754 03/03/19 0019  Weight: 77.1 kg 98.9 kg   REVIEW OF SYSTEMS  As per history otherwise all reviewed and reported negative  Exam:  General exam: Alert, awake, oriented x 3 Respiratory system: Clear to auscultation. Respiratory effort normal. Cardiovascular system:RRR. No murmurs, rubs, gallops. Gastrointestinal system: Abdomen is nondistended, soft and nontender. No organomegaly or masses felt. Normal bowel sounds heard. Central nervous system: Alert and oriented. No focal neurological deficits. Extremities: No C/C/E, +pedal pulses Skin: No rashes, lesions or ulcers Psychiatry: Judgement and insight appear normal. Mood & affect appropriate.    Data Reviewed: Basic Metabolic Panel: Recent Labs  Lab 03/02/19 1816 03/03/19 0634 03/05/19 0644  NA 139 139 137  K 3.8 3.4* 4.1  CL 103 106 102  CO2 26 24 27   GLUCOSE 102* 112* 104*  BUN 14 10 16   CREATININE 0.87 0.75 0.74  CALCIUM 9.0 8.1* 8.2*   Liver Function Tests: Recent Labs  Lab 03/02/19 1816 03/03/19 0634  AST 39 35  ALT 28 27  ALKPHOS 209* 186*  BILITOT 0.5 1.2  PROT 7.8 7.0  ALBUMIN 4.0 3.6   No results for input(s): LIPASE, AMYLASE in the last 168 hours. No results for input(s): AMMONIA in the last 168 hours. CBC: Recent Labs  Lab 03/02/19 1816 03/03/19 0634  WBC 19.7* 17.5*  NEUTROABS 15.5*  --   HGB 16.3 15.6  HCT 52.9* 50.1  MCV 89.8 89.8  PLT 356 337   Cardiac Enzymes: Recent Labs  Lab 03/02/19 1816 03/03/19 0033 03/03/19 0634 03/03/19 1204  CKTOTAL 278  --   --   --   TROPONINI 0.05* 0.05* 0.04* 0.03*   CBG (last 3)  No results for input(s): GLUCAP in the last 72 hours. Recent Results (from the past 240 hour(s))  Urine culture     Status: None   Collection Time: 03/02/19  6:16 PM    Result Value Ref Range Status   Specimen Description   Final    URINE, CLEAN CATCH Performed at [facility], [address], [place], [place] [address]    Special Requests   Final    Normal Performed at [facility], [address], [place], [place] [address]    Culture   Final    NO GROWTH Performed at [facility], [place] [address] [place], [place] [address]    Report Status 03/04/2019 FINAL  Final     Studies: No results found. Scheduled Meds: . atorvastatin  80 mg Oral Q M,W,F  . cholecalciferol  2,000 Units Oral Daily  . finasteride  5 mg Oral Daily  . furosemide  40 mg Oral Daily  . metoprolol tartrate  37.5 mg Oral BID  . mirabegron ER  50 mg Oral Daily  . sodium chloride flush  3 mL Intravenous Q12H  . tamsulosin  0.8 mg Oral Daily  . umeclidinium bromide  1 puff Inhalation Daily  . Warfarin - Pharmacist Dosing Inpatient   Does not apply q1800   Continuous Infusions: . sodium chloride      Active Problems:   Ischemic cardiomyopathy   HLD (hyperlipidemia)   CAD (coronary atherosclerotic disease)   Implantable cardioverter-defibrillator single chamber [employer] [facility]   Obstructive sleep apnea   H/O ventricular fibrillation   COPD (chronic obstructive pulmonary disease) (HCC)   Macular degeneration, dry   History of DVT (deep vein thrombosis)   History of pulmonary embolus (PE)   Fall   Time spent: 73mins  Kathie Dike, MD [facility] 03/06/2019, 7:30 PM    LOS: 4 days
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
## 2019-03-07 DIAGNOSIS — G4733 Obstructive sleep apnea (adult) (pediatric): Secondary | ICD-10-CM | POA: Diagnosis not present

## 2019-03-07 DIAGNOSIS — I5032 Chronic diastolic (congestive) heart failure: Secondary | ICD-10-CM | POA: Diagnosis not present

## 2019-03-07 DIAGNOSIS — J9610 Chronic respiratory failure, unspecified whether with hypoxia or hypercapnia: Secondary | ICD-10-CM | POA: Diagnosis not present

## 2019-03-07 DIAGNOSIS — R1312 Dysphagia, oropharyngeal phase: Secondary | ICD-10-CM | POA: Diagnosis not present

## 2019-03-07 DIAGNOSIS — R41841 Cognitive communication deficit: Secondary | ICD-10-CM | POA: Diagnosis not present

## 2019-03-07 DIAGNOSIS — C3490 Malignant neoplasm of unspecified part of unspecified bronchus or lung: Secondary | ICD-10-CM | POA: Diagnosis not present

## 2019-03-07 DIAGNOSIS — M6281 Muscle weakness (generalized): Secondary | ICD-10-CM | POA: Diagnosis not present

## 2019-03-07 DIAGNOSIS — Z8679 Personal history of other diseases of the circulatory system: Secondary | ICD-10-CM | POA: Diagnosis not present

## 2019-03-07 DIAGNOSIS — R55 Syncope and collapse: Secondary | ICD-10-CM | POA: Diagnosis not present

## 2019-03-07 DIAGNOSIS — R262 Difficulty in walking, not elsewhere classified: Secondary | ICD-10-CM | POA: Diagnosis not present

## 2019-03-07 DIAGNOSIS — I255 Ischemic cardiomyopathy: Secondary | ICD-10-CM | POA: Diagnosis not present

## 2019-03-07 DIAGNOSIS — Z95810 Presence of automatic (implantable) cardiac defibrillator: Secondary | ICD-10-CM

## 2019-03-07 DIAGNOSIS — W19XXXS Unspecified fall, sequela: Secondary | ICD-10-CM | POA: Diagnosis not present

## 2019-03-07 DIAGNOSIS — H353130 Nonexudative age-related macular degeneration, bilateral, stage unspecified: Secondary | ICD-10-CM | POA: Diagnosis not present

## 2019-03-07 DIAGNOSIS — E785 Hyperlipidemia, unspecified: Secondary | ICD-10-CM | POA: Diagnosis not present

## 2019-03-07 DIAGNOSIS — F0390 Unspecified dementia without behavioral disturbance: Secondary | ICD-10-CM | POA: Diagnosis not present

## 2019-03-07 DIAGNOSIS — I2510 Atherosclerotic heart disease of native coronary artery without angina pectoris: Secondary | ICD-10-CM | POA: Diagnosis not present

## 2019-03-07 DIAGNOSIS — J449 Chronic obstructive pulmonary disease, unspecified: Secondary | ICD-10-CM | POA: Diagnosis not present

## 2019-03-07 LAB — PROTIME-INR
INR: 2.1 — ABNORMAL HIGH (ref 0.8–1.2)
Prothrombin Time: 23.5 seconds — ABNORMAL HIGH (ref 11.4–15.2)

## 2019-03-07 MED ORDER — TRAMADOL HCL 50 MG PO TABS: 50.0000 mg | ORAL_TABLET | Freq: Three times a day (TID) | ORAL | 0 refills | Status: DC | PRN

## 2019-03-07 MED ORDER — POLYETHYLENE GLYCOL 3350 17 G PO PACK: 17.0000 g | PACK | Freq: Every day | ORAL | Status: DC

## 2019-03-07 MED ORDER — FUROSEMIDE 20 MG PO TABS: 40.0000 mg | ORAL_TABLET | Freq: Every day | ORAL | 1 refills | Status: DC

## 2019-03-07 MED ORDER — POLYETHYLENE GLYCOL 3350 17 G PO PACK: 17.0000 g | PACK | Freq: Every day | ORAL | 0 refills | Status: DC

## 2019-03-07 MED ORDER — WARFARIN SODIUM 2 MG PO TABS: 3.0000 mg | ORAL_TABLET | Freq: Once | ORAL | Status: DC

## 2019-03-07 NOTE — Discharge Summary (Signed)
Physician Discharge Summary  Arkeem Harts W[facility]:[id number] DOB: November 28, 1946 DOA: 03/02/2019  PCP: Dettinger, Fransisca Kaufmann, MD  Admit date: 03/02/2019 Discharge date: 03/07/2019  Admitted From: Home Disposition: Skilled nursing facility  Recommendations for Outpatient Follow-up:  1. Follow up with PCP in 1-2 weeks 2. Please obtain BMP/CBC in one week  Discharge Condition: Stable CODE STATUS: Full code Diet recommendation: Heart healthy  Brief/Interim Summary: 73 year old male with a history of dementia, coronary artery disease, V. fib arrest in the past with ICD placement, chronic diastolic heart failure, lung cancer status post radiation treatment, COPD, was brought to the hospital after having a fall at home.  There was concern for possible underlying syncope.  He was also noted to be in decompensated CHF.  He was admitted for further treatment.  Discharge Diagnoses:  Active Problems:   Ischemic cardiomyopathy   HLD (hyperlipidemia)   CAD (coronary atherosclerotic disease)   Implantable cardioverter-defibrillator single chamber [employer]   Obstructive sleep apnea   H/O ventricular fibrillation   COPD (chronic obstructive pulmonary disease) (HCC)   Macular degeneration, dry   History of DVT (deep vein thrombosis)   History of pulmonary embolus (PE)   Fall  1. Fall with possible syncope.  Fortunately patient was unable to recall circumstances leading to the event.  He was worked up for syncope given his complex cardiac history.  He was seen by cardiology.  Echocardiogram was unrevealing.  Pacemaker was interrogated and no acute findings noted.  No further cardiac work-up recommended at this time.  Patient has not had any further episodes since being admitted to the hospital. 2. Acute on chronic diastolic congestive heart failure.  Patient was treated with intravenous Lasix.  He appears to be euvolemic at this time.  He is now back on oral Lasix. 3. Oxygen dependent COPD with chronic  respiratory failure.  He is chronically on 2 to 3 L of oxygen.  Currently oxygen requirement is at baseline. 4. Hypertension.  Stable and controlled. 5. Dementia.  Stable 6. History of DVT/PE.  On chronic anticoagulation.  Warfarin adjustment per pharmacy. 7. Obstructive sleep apnea.  Patient wears BiPAP at home.  Discharge Instructions  Discharge Instructions    Diet - low sodium heart healthy   Complete by:  As directed    Increase activity slowly   Complete by:  As directed      Allergies as of 03/07/2019      Reactions   Ativan [lorazepam] Anxiety, Other (See Comments)   Hyper  [name]      Medication List    STOP taking these medications   amoxicillin-clavulanate 875-125 MG tablet Commonly known as:  AUGMENTIN     TAKE these medications   atorvastatin 80 MG tablet Commonly known as:  LIPITOR TAKE 1 TABLET BY MOUTH THREE TIMES PER WEEK What changed:  See the new instructions.   finasteride 5 MG tablet Commonly known as:  PROSCAR Take 1 tablet (5 mg total) by mouth daily.   furosemide 20 MG tablet Commonly known as:  LASIX Take 2 tablets (40 mg total) by mouth daily. What changed:  how much to take   metoprolol tartrate 25 MG tablet Commonly known as:  LOPRESSOR Take 1.5 tablets (37.5 mg total) by mouth 2 (two) times daily.   multivitamin tablet Take 1 tablet by mouth daily.   Myrbetriq 50 MG Tb24 tablet Generic drug:  mirabegron ER Take 50 mg by mouth every evening.   polyethylene  glycol packet Commonly known as:  MIRALAX / GLYCOLAX Take 17 g by mouth daily.   PRESERVISION AREDS PO Take 1 capsule by mouth 2 (two) times daily.   risedronate 150 MG tablet Commonly known as:  Actonel Take 1 tablet (150 mg total) by mouth every 30 (thirty) days. with water on empty stomach, nothing by mouth or lie down for next 30 minutes.   tamsulosin 0.4 MG Caps capsule Commonly known as:  FLOMAX Take 2 capsules (0.8 mg total)  by mouth daily.   tiotropium 18 MCG inhalation capsule Commonly known as:  SPIRIVA PLACE 1 CAPSULE INTO INHALER AND INHALE DAILY What changed:  See the new instructions.   traMADol 50 MG tablet Commonly known as:  ULTRAM Take 1 tablet (50 mg total) by mouth every 8 (eight) hours as needed.   Vitamin D3 50 MCG (2000 UT) Tabs Take 1 tablet by mouth every morning.   warfarin 2 MG tablet Commonly known as:  COUMADIN Take as directed. If you are unsure how to take this medication, talk to your nurse or doctor. Original instructions:  Take 1 tablet (2 mg total) by mouth daily at 6 PM. What changed:    how much to take  when to take this  additional instructions      Contact information for after-discharge care    [facility] .   Service:  Skilled Nursing Contact information: [address] [address] [phone]             Allergies  Allergen Reactions  . Ativan [Lorazepam] Anxiety and Other (See Comments)    Hyper  [name]    Consultations:  Cardiology   Procedures/Studies: Dg Chest 2 View  Result Date: 03/02/2019 CLINICAL DATA:  Syncope and fall. Unwitnessed fall. Shortness of breath. EXAM: CHEST - 2 VIEW COMPARISON:  CT 02/25/2019 FINDINGS: Pacemaker in place with battery pack overlying the right chest, with the left subclavian venous approach. The heart is enlarged. Unchanged mediastinal contours. Perivascular haziness and peribronchial cuffing left perihilar scarring as seen on CT. No large pleural effusion. No pneumothorax. Bones are under mineralized. Remote left lower rib fractures. No acute osseous abnormalities are seen. IMPRESSION: 1. Cardiomegaly with vascular congestion and probable mild edema. 2. Left perihilar scarring. Electronically Signed   By: Keith Rake M.D.   On: 03/02/2019 19:49   Dg Elbow Complete Left  Result Date: 03/02/2019 CLINICAL  DATA:  Left elbow pain after unwitnessed fall. EXAM: LEFT ELBOW - COMPLETE 3+ VIEW COMPARISON:  None. FINDINGS: There is no evidence of fracture, dislocation, or joint effusion. Trace osteoarthritis of the ulnohumeral joint with spurring. Joint spaces are maintained. Soft tissues are unremarkable. IMPRESSION: No fracture or subluxation of the left elbow. Electronically Signed   By: Keith Rake M.D.   On: 03/02/2019 19:50   Ct Head Wo Contrast  Result Date: 03/02/2019 CLINICAL DATA:  [place] at home today, found down. History of dementia, seizures, stroke, lung cancer. EXAM: CT HEAD WITHOUT CONTRAST CT CERVICAL SPINE WITHOUT CONTRAST TECHNIQUE: Multidetector CT imaging of the head and cervical spine was performed following the standard protocol without intravenous contrast. Multiplanar CT image reconstructions of the cervical spine were also generated. COMPARISON:  CT HEAD and cervical spine September 07, 2017 and CT HEAD January 05, 2019. FINDINGS: CT HEAD FINDINGS BRAIN: No intraparenchymal hemorrhage, mass effect nor midline shift. No parenchymal brain volume loss for age.  No hydrocephalus. Old LEFT thalamus lacunar infarct. Biparietal encephalomalacia again noted. Old small LEFT > RIGHT cerebellar infarcts. No acute large vascular territory infarct. Patchy supratentorial white matter hypodensities compatible with mild chronic small vessel ischemic changes. No abnormal extra-axial fluid collections. Basal cisterns are patent. VASCULAR: Mild calcific atherosclerosis of the carotid siphons. SKULL: No skull fracture.  Small parietal scalp hematomas. SINUSES/ORBITS: Mild paranasal sinus mucosal thickening without air-fluid levels. New LEFT mastoid effusion.The included ocular globes and orbital contents are non-suspicious. Status post bilateral ocular lens implants. OTHER: None. CT CERVICAL SPINE FINDINGS ALIGNMENT: Reversed cervical lordosis at C5-6, similar. Minimal grade 1 C4-5 anterolisthesis. SKULL BASE AND  VERTEBRAE: Cervical vertebral bodies and posterior elements are intact. Severe C5-6 disc height loss and endplate spurring compatible with degenerative disc, unchanged. Severe LEFT upper cervical facet arthropathy. No destructive bony lesions. SOFT TISSUES AND SPINAL CANAL: Nonacute. DISC LEVELS: No high-grade osseous canal stenosis. Severe C3-4, RIGHT C5-6 neural foraminal narrowing. UPPER CHEST: Biapical bullous changes. LEFT cardiac pacemaker leads. OTHER: None. IMPRESSION: CT HEAD: 1. No acute intracranial process.  Small parietal scalp hematomas. 2. Stable examination including old biparietal, LEFT thalamus and cerebellar infarcts. CT CERVICAL SPINE: 1. No acute fracture. Stable grade 1 C4-5 anterolisthesis on a degenerative basis. 2. Severe C3-4 and C5-6 neural foraminal narrowing. Electronically Signed   By: Elon Alas M.D.   On: 03/02/2019 19:24   Ct Chest W Contrast  Result Date: 02/25/2019 CLINICAL DATA:  Lung cancers EXAM: CT CHEST WITH CONTRAST TECHNIQUE: Multidetector CT imaging of the chest was performed during intravenous contrast administration. CONTRAST:  58mL OMNIPAQUE IOHEXOL 300 MG/ML  SOLN COMPARISON:  02/17/2018 FINDINGS: Cardiovascular: The heart size is normal. No substantial pericardial effusion. Coronary artery calcification is evident. Atherosclerotic calcification is noted in the wall of the thoracic aorta. Permanent pacemaker again noted. Mediastinum/Nodes: No mediastinal lymphadenopathy. There is no hilar lymphadenopathy. The esophagus has normal imaging features. There is no axillary lymphadenopathy. Lungs/Pleura: The central tracheobronchial airways are patent. Centrilobular paraseptal emphysema again noted bilaterally. Stable appearance left parahilar scarring, likely treatment related. Right middle lobe chronic atelectasis or scarring is similar to prior. Retro hilar architectural distortion on the right is stable. No pleural effusion. No new suspicious nodule or mass.  Upper Abdomen: Calcified gallstones evident.  No adrenal mass. Musculoskeletal: Bones are diffusely demineralized. Anterior midline probable sebaceous cyst is stable. Previously described compression fractures at T10 and T12 are stable. Superior endplate compression noted at L1. a new compression fracture is seen today at T6 with approximately 75% loss of height. IMPRESSION: 1. Stable cardiopulmonary exam. No new or progressive findings to suggest recurrent/metastatic disease. 2.  Emphysema. (ICD10-J43.9) 3.  Aortic Atherosclerois (ICD10-170.0) 4. Cholelithiasis 5. Interval development of compression fracture at T6. Electronically Signed   By: Misty Genesis M.D.   On: 02/25/2019 16:02   Ct Cervical Spine Wo Contrast  Result Date: 03/02/2019 CLINICAL DATA:  [place] at home today, found down. History of dementia, seizures, stroke, lung cancer. EXAM: CT HEAD WITHOUT CONTRAST CT CERVICAL SPINE WITHOUT CONTRAST TECHNIQUE: Multidetector CT imaging of the head and cervical spine was performed following the standard protocol without intravenous contrast. Multiplanar CT image reconstructions of the cervical spine were also generated. COMPARISON:  CT HEAD and cervical spine September 07, 2017 and CT HEAD January 05, 2019. FINDINGS: CT HEAD FINDINGS BRAIN: No intraparenchymal hemorrhage, mass effect nor midline shift. No parenchymal brain volume loss for age. No hydrocephalus. Old LEFT thalamus lacunar infarct. Biparietal encephalomalacia again noted. Old small LEFT >  RIGHT cerebellar infarcts. No acute large vascular territory infarct. Patchy supratentorial white matter hypodensities compatible with mild chronic small vessel ischemic changes. No abnormal extra-axial fluid collections. Basal cisterns are patent. VASCULAR: Mild calcific atherosclerosis of the carotid siphons. SKULL: No skull fracture.  Small parietal scalp hematomas. SINUSES/ORBITS: Mild paranasal sinus mucosal thickening without air-fluid levels. New LEFT  mastoid effusion.The included ocular globes and orbital contents are non-suspicious. Status post bilateral ocular lens implants. OTHER: None. CT CERVICAL SPINE FINDINGS ALIGNMENT: Reversed cervical lordosis at C5-6, similar. Minimal grade 1 C4-5 anterolisthesis. SKULL BASE AND VERTEBRAE: Cervical vertebral bodies and posterior elements are intact. Severe C5-6 disc height loss and endplate spurring compatible with degenerative disc, unchanged. Severe LEFT upper cervical facet arthropathy. No destructive bony lesions. SOFT TISSUES AND SPINAL CANAL: Nonacute. DISC LEVELS: No high-grade osseous canal stenosis. Severe C3-4, RIGHT C5-6 neural foraminal narrowing. UPPER CHEST: Biapical bullous changes. LEFT cardiac pacemaker leads. OTHER: None. IMPRESSION: CT HEAD: 1. No acute intracranial process.  Small parietal scalp hematomas. 2. Stable examination including old biparietal, LEFT thalamus and cerebellar infarcts. CT CERVICAL SPINE: 1. No acute fracture. Stable grade 1 C4-5 anterolisthesis on a degenerative basis. 2. Severe C3-4 and C5-6 neural foraminal narrowing. Electronically Signed   By: Elon Alas M.D.   On: 03/02/2019 19:24   Dg Hip Unilat W Or Wo Pelvis 2-3 Views Left  Result Date: 03/02/2019 CLINICAL DATA:  Left hip pain after unwitnessed fall. EXAM: DG HIP (WITH OR WITHOUT PELVIS) 2-3V LEFT COMPARISON:  Pelvic radiograph 09/07/2017, femur radiograph 09/09/2017 FINDINGS: Intramedullary rod with trans trochanteric screw fixation of prior intertrochanteric femur fracture. Surgical fixation of the distal femoral shaft with broken proximal most screw is only partially included but unchanged from prior exam. No evidence of acute fracture. Pubic rami are intact. Pubic symphysis is congruent. The bones are under mineralized. IMPRESSION: No acute fracture or subluxation of the pelvis or left hip. Intact left proximal femur fixation hardware. Electronically Signed   By: Keith Rake M.D.   On: 03/02/2019  19:52    Echo:1. The left ventricle has hyperdynamic systolic function, with an ejection fraction of >65%. The cavity size was normal. Left ventricular diastolic Doppler parameters are indeterminate.  2. The right ventricle has normal systolic function. The cavity was normal. There is no increase in right ventricular wall thickness.  3. The aortic valve is tricuspid Mild thickening of the aortic valve Mild calcification of the aortic valve. no stenosis of the aortic valve. Mild aortic annular calcification noted.  4. The mitral valve is normal in structure. There is mild mitral annular calcification present.  5. The tricuspid valve is normal in structure.  6. The aortic root is normal in size and structure.  7. Pulmonary hypertension is indeterminant, inadequate TR jet.  Subjective: Patient denies any shortness of breath.  No chest pain.  Discharge Exam: Vitals:   03/06/19 1400 03/06/19 2140 03/07/19 0520 03/07/19 0953  BP: 109/77 120/76 126/72   Pulse: 88 99 84   Resp: 18  18   Temp: 98.6 F (37 C) 97.9 F (36.6 C) 98 F (36.7 C)   TempSrc:  Oral Oral   SpO2: 93% 94% 96% 98%  Weight:      Height:        General: Pt is alert, awake, not in acute distress Cardiovascular: RRR, S1/S2 +, no rubs, no gallops Respiratory: CTA bilaterally, no wheezing, no rhonchi Abdominal: Soft, NT, ND, bowel sounds + Extremities: no edema, no cyanosis  The results of significant diagnostics from this hospitalization (including imaging, microbiology, ancillary and laboratory) are listed below for reference.     Microbiology: Recent Results (from the past 240 hour(s))  Urine culture     Status: None   Collection Time: 03/02/19  6:16 PM  Result Value Ref Range Status   Specimen Description   Final    URINE, CLEAN CATCH Performed at [facility], [address], [place], [place] [address]    Special Requests   Final    Normal Performed at [facility], [address] [place], [place]  [address]    Culture   Final    NO GROWTH Performed at [facility], [place] [address] [place], [place] [address]    Report Status 03/04/2019 FINAL  Final     Labs: BNP (last 3 results) Recent Labs    03/03/19 0033  BNP 662.9   Basic Metabolic Panel: Recent Labs  Lab 03/02/19 1816 03/03/19 0634 03/05/19 0644  NA 139 139 137  K 3.8 3.4* 4.1  CL 103 106 102  CO2 26 24 27   GLUCOSE 102* 112* 104*  BUN 14 10 16   CREATININE 0.87 0.75 0.74  CALCIUM 9.0 8.1* 8.2*   Liver Function Tests: Recent Labs  Lab 03/02/19 1816 03/03/19 0634  AST 39 35  ALT 28 27  ALKPHOS 209* 186*  BILITOT 0.5 1.2  PROT 7.8 7.0  ALBUMIN 4.0 3.6   No results for input(s): LIPASE, AMYLASE in the last 168 hours. No results for input(s): AMMONIA in the last 168 hours. CBC: Recent Labs  Lab 03/02/19 1816 03/03/19 0634  WBC 19.7* 17.5*  NEUTROABS 15.5*  --   HGB 16.3 15.6  HCT 52.9* 50.1  MCV 89.8 89.8  PLT 356 337   Cardiac Enzymes: Recent Labs  Lab 03/02/19 1816 03/03/19 0033 03/03/19 0634 03/03/19 1204  CKTOTAL 278  --   --   --   TROPONINI 0.05* 0.05* 0.04* 0.03*   BNP: Invalid input(s): POCBNP CBG: No results for input(s): GLUCAP in the last 168 hours. D-Dimer No results for input(s): DDIMER in the last 72 hours. Hgb A1c No results for input(s): HGBA1C in the last 72 hours. Lipid Profile No results for input(s): CHOL, HDL, LDLCALC, TRIG, CHOLHDL, LDLDIRECT in the last 72 hours. Thyroid function studies No results for input(s): TSH, T4TOTAL, T3FREE, THYROIDAB in the last 72 hours.  Invalid input(s): FREET3 Anemia work up No results for input(s): VITAMINB12, FOLATE, FERRITIN, TIBC, IRON, RETICCTPCT in the last 72 hours. Urinalysis    Component Value Date/Time   COLORURINE YELLOW 03/02/2019 1816   APPEARANCEUR CLEAR 03/02/2019 1816   APPEARANCEUR Clear 12/10/2018 1422   LABSPEC 1.013 03/02/2019 1816   PHURINE 7.0 03/02/2019 1816   GLUCOSEU NEGATIVE 03/02/2019 1816    HGBUR NEGATIVE 03/02/2019 1816   BILIRUBINUR NEGATIVE 03/02/2019 1816   BILIRUBINUR Negative 12/10/2018 1422   KETONESUR 5 (A) 03/02/2019 1816   PROTEINUR NEGATIVE 03/02/2019 1816   UROBILINOGEN 1.0 01/31/2013 1035   NITRITE NEGATIVE 03/02/2019 1816   LEUKOCYTESUR NEGATIVE 03/02/2019 1816   Sepsis Labs Invalid input(s): PROCALCITONIN,  WBC,  LACTICIDVEN Microbiology Recent Results (from the past 240 hour(s))  Urine culture     Status: None   Collection Time: 03/02/19  6:16 PM  Result Value Ref Range Status   Specimen Description   Final    URINE, CLEAN CATCH Performed at [facility], [address], [place], [place] [address]    Special Requests  Final    Normal Performed at [facility], [address], [place], [place] [address]    Culture   Final    NO GROWTH Performed at [facility], [place] [address] [place], [place] [address]    Report Status 03/04/2019 FINAL  Final     Time coordinating discharge: 85mins  SIGNED:   Kathie Dike, MD  [facility] 03/07/2019, 1:14 PM   If 7PM-7AM, please contact night-coverage [email or web address]

## 2019-03-07 NOTE — Progress Notes (Signed)
Physical Therapy Treatment Patient Details Name: Travis Cruz MRN: [id number] DOB: 08/23/1946 Today's Date: 03/07/2019    History of Present Illness Travis Cruz  is a 73 y.o. male, with a history ofCoronary artery disease, ventricular fibrillation arrest in 2014 status post ICD placement, ischemic cardiomyopathy, chronic diastolic heart failure, lung cancer status post radiation treatment, COPD, obstructive sleep apnea on CPAP came to ED after patient fell at home.  Patient does not recall passing out but he is a poor historian.  As per patient's wife patient was on the floor for 2 hours and was unable to get up.  Family has noted that patient has become progressively weak over past 2 months and has been getting physical therapy as outpatient which has not helped him much.  Patient says that his legs just give out and he was unable to get up most of times when he falls.  He has ICD in place and did not notice any shocks delivered by ICD.    PT Comments    Patient presented lying in bed with nursing present. Patient agreeable to participating in therapy today. 2 LPM O2. Patient able to perform bed mobility with supervision, transfers w/ RW w/ cues for hand placement and sequencing of steps, and ambulation w/ RW 170 feet without loss of balance but limited by fatigue and c/o weakness in legs. 2 LPM O2 throughout and SpO2 readings 93-94% during ambulation.  Patient would continue to benefit from skilled physical therapy in current environment and next venue to continue return to prior function and increase strength, endurance, balance, coordination, and functional mobility and gait skills.    Follow Up Recommendations  SNF     Equipment Recommendations  None recommended by PT    Recommendations for Other Services       Precautions / Restrictions Precautions Precautions: Fall Restrictions Weight Bearing Restrictions: No    Mobility  Bed Mobility Overal bed mobility: Needs  Assistance Bed Mobility: Supine to Sit     Supine to sit: Supervision     General bed mobility comments: increased time, somewhat labored movement  Transfers Overall transfer level: Needs assistance Equipment used: Rolling walker (2 wheeled) Transfers: Sit to/from [facility] Sit to Stand: Min guard Stand pivot transfers: Min guard       General transfer comment: labored movement  Ambulation/Gait Ambulation/Gait assistance: Min guard Gait Distance (Feet): 170 Feet Assistive device: Rolling walker (2 wheeled) Gait Pattern/deviations: Decreased step length - right;Decreased step length - left;Decreased stride length Gait velocity: decreased   General Gait Details: slow labored cadence without loss of balance while on 2 LPM, with SpO2 reading 93-94% during ambulation, mostly limited due to c/o fatigue/weakness   Stairs             Wheelchair Mobility    Modified Rankin (Stroke Patients Only)       Balance Overall balance assessment: Needs assistance Sitting-balance support: Feet supported;No upper extremity supported Sitting balance-Leahy Scale: Good     Standing balance support: Bilateral upper extremity supported;During functional activity Standing balance-Leahy Scale: Fair Standing balance comment: using RW                            Cognition Arousal/Alertness: Awake/alert Behavior During Therapy: WFL for tasks assessed/performed Overall Cognitive Status: Within Functional Limits for tasks assessed  Exercises General Exercises - Upper Extremity Shoulder Flexion: AROM;Strengthening;Both;10 reps;Seated General Exercises - Lower Extremity Gluteal Sets: Strengthening;Both;10 reps;Seated Long Arc Quad: AROM;Strengthening;Both;10 reps;Seated Hip Flexion/Marching: AROM;Strengthening;10 reps;Seated Toe Raises: AROM;Strengthening;Both;10 reps;Seated Heel Raises:  AROM;Strengthening;Both;10 reps;Seated    General Comments        Pertinent Vitals/Pain      Home Living                      Prior Function            PT Goals (current goals can now be found in the care plan section) Acute Rehab PT Goals Patient Stated Goal: return home after rehab PT Goal Formulation: With patient/family Time For Goal Achievement: 03/18/19 Potential to Achieve Goals: Good Progress towards PT goals: Progressing toward goals    Frequency    Min 3X/week      PT Plan Current plan remains appropriate    Co-evaluation              AM-PAC PT "6 Clicks" Mobility   Outcome Measure  Help needed turning from your back to your side while in a flat bed without using bedrails?: A Little Help needed moving from lying on your back to sitting on the side of a flat bed without using bedrails?: A Little Help needed moving to and from a bed to a chair (including a wheelchair)?: A Little Help needed standing up from a chair using your arms (e.g., wheelchair or bedside chair)?: A Little Help needed to walk in hospital room?: A Little Help needed climbing 3-5 steps with a railing? : A Lot 6 Click Score: 17    End of Session Equipment Utilized During Treatment: Oxygen;Gait belt Activity Tolerance: Patient tolerated treatment well;Patient limited by fatigue Patient left: in chair;with call bell/phone within reach;with family/visitor present;with chair alarm set Nurse Communication: Mobility status PT Visit Diagnosis: Unsteadiness on feet (R26.81);Other abnormalities of gait and mobility (R26.89);Muscle weakness (generalized) (M62.81)     Time: 6767-2094 PT Time Calculation (min) (ACUTE ONLY): 30 min  Charges:  $Gait Training: 8-22 mins $Therapeutic Exercise: 8-22 mins                     Floria Raveling. Hartnett-Rands, MS, PT Per [place] [phone] 03/07/2019, 10:04 AM

## 2019-03-07 NOTE — NC FL2 (Signed)
[place] LEVEL OF CARE SCREENING TOOL     IDENTIFICATION  Patient Name: Travis Cruz Birthdate: 08/26/1946 Sex: male Admission Date (Current Location): 03/02/2019  [facility] and [place] Number:  [employer] and Address:  [place] [address], [place]      Provider Number: [id number]  Attending Physician Name and Address:  Kathie Dike, MD  Relative Name and Phone Number:  [place] [phone]    Current Level of Care: Hospital Recommended Level of Care: [place] Prior Approval Number:    Date Approved/Denied:   PASRR Number: [id number]  Discharge Plan: SNF    Current Diagnoses: Patient Active Problem List   Diagnosis Date Noted  . Fall 03/02/2019  . Senile purpura ([place]) 11/02/2017  . Preoperative cardiovascular examination   . Acute diastolic CHF (congestive heart failure), NYHA class 3 ([place])   . Closed left hip fracture, initial encounter ([place])   . Supratherapeutic INR   . Hip fracture requiring operative repair ([place]) 09/07/2017  . Hip fracture ([place]) 09/07/2017  . Abrasion, right knee, initial encounter   . Forehead contusion   . Cancer of bronchus of right lower lobe ([place]) 02/25/2017  . History of DVT (deep vein thrombosis) 12/04/2016  . History of pulmonary embolus (PE) 12/04/2016  . History of CVA (cerebrovascular accident) 12/04/2016  . Macular degeneration, dry 12/20/2015  . Cellulitis and abscess 09/27/2015  . COPD (chronic obstructive pulmonary disease) ([place])   . Implantable cardioverter-defibrillator single chamber [employer] 10/05/2013  . Malignant neoplasm of upper lobe of left lung ([place]) 09/16/2013  . Abnormal chest x-ray 05/13/2013  . HLD (hyperlipidemia) 05/13/2013  . CAD (coronary atherosclerotic disease) 05/13/2013  . Transient ischemic attack (TIA), and cerebral infarction without residual deficits 03/28/2013  . H/O ventricular fibrillation 03/28/2013  . Ischemic  cardiomyopathy 03/15/2013  . NSTEMI (non-ST elevated myocardial infarction) ([place]) 01/27/2013  . Obstructive sleep apnea 03/10/2012  . Patent arterial duct 03/10/2012  . HTN (hypertension) 03/24/2011  . Deep vein thrombosis (DVT) (HCC) 09/23/2008    Orientation RESPIRATION BLADDER Height & Weight     Self, Situation  O2, Other (Comment)(BiPap - auto titrate) Continent Weight: 98.9 kg Height:  5\' 9"  (175.3 cm)  BEHAVIORAL SYMPTOMS/MOOD NEUROLOGICAL BOWEL NUTRITION STATUS      Continent Diet  AMBULATORY STATUS COMMUNICATION OF NEEDS Skin   Limited Assist Verbally Normal                       Personal Care Assistance Level of Assistance  Bathing, Feeding, Dressing Bathing Assistance: Limited assistance Feeding assistance: Independent Dressing Assistance: Limited assistance     Functional Limitations Info  Hearing   Hearing Info: Impaired      SPECIAL CARE FACTORS FREQUENCY  PT (By licensed PT)     PT Frequency: 5x/week              Contractures Contractures Info: Not present    Additional Factors Info  Allergies   Allergies Info: ativan           Current Medications (03/07/2019):  This is the current hospital active medication list Current Facility-Administered Medications  Medication Dose Route Frequency Provider Last Rate Last Dose  . 0.9 %  sodium chloride infusion  250 mL Intravenous PRN Oswald Hillock, MD      . acetaminophen (TYLENOL) tablet 650 mg  650 mg Oral Q6H PRN Oswald Hillock, MD  Or  . acetaminophen (TYLENOL) suppository 650 mg  650 mg Rectal Q6H PRN Oswald Hillock, MD      . atorvastatin (LIPITOR) tablet 80 mg  80 mg Oral Q M,W,F Oswald Hillock, MD   80 mg at 03/07/19 0815  . cholecalciferol (VITAMIN D3) tablet 2,000 Units  2,000 Units Oral Daily Oswald Hillock, MD   2,000 Units at 03/07/19 0815  . finasteride (PROSCAR) tablet 5 mg  5 mg Oral Daily Oswald Hillock, MD   5 mg at 03/07/19 0815  . furosemide (LASIX) tablet 40 mg  40 mg Oral  Daily Kathie Dike, MD   40 mg at 03/07/19 0815  . metoprolol tartrate (LOPRESSOR) tablet 37.5 mg  37.5 mg Oral BID Oswald Hillock, MD   37.5 mg at 03/07/19 0815  . mirabegron ER (MYRBETRIQ) tablet 50 mg  50 mg Oral Daily Oswald Hillock, MD   50 mg at 03/07/19 0815  . polyethylene glycol (MIRALAX / GLYCOLAX) packet 17 g  17 g Oral Daily Memon, Jehanzeb, MD      . sodium chloride flush (NS) 0.9 % injection 3 mL  3 mL Intravenous Q12H Oswald Hillock, MD   3 mL at 03/07/19 0816  . sodium chloride flush (NS) 0.9 % injection 3 mL  3 mL Intravenous PRN Oswald Hillock, MD   3 mL at 03/04/19 2207  . tamsulosin (FLOMAX) capsule 0.8 mg  0.8 mg Oral Daily Oswald Hillock, MD   0.8 mg at 03/07/19 0815  . traMADol (ULTRAM) tablet 50 mg  50 mg Oral Q8H PRN Kathie Dike, MD      . umeclidinium bromide (INCRUSE ELLIPTA) 62.5 MCG/INH 1 puff  1 puff Inhalation Daily Kathie Dike, MD   1 puff at 03/07/19 0953  . warfarin (COUMADIN) tablet 3 mg  3 mg Oral Once Kathie Dike, MD      . Warfarin - Pharmacist Dosing Inpatient   Does not apply [id number] Oswald Hillock, MD         Discharge Medications: Please see discharge summary for a list of discharge medications.  Relevant Imaging Results:  Relevant Lab Results:   Additional Information SSN: [id number]  Roda Shutters Margretta Sidle, RN

## 2019-03-07 NOTE — Care Management (Signed)
SNF has received authorization. Pt okay to DC today. RN may call report. Wife will pick pt up and transport later this afternoon.

## 2019-03-07 NOTE — Care Management Important Message (Signed)
Important Message  Patient Details  Name: Travis Cruz MRN: [id number] Date of Birth: 1946/05/25   Medicare Important Message Given:  Yes    Tommy Medal 03/07/2019, 1:49 PM

## 2019-03-07 NOTE — Progress Notes (Signed)
Report given to [facility] in [place].
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
## 2019-03-07 NOTE — Progress Notes (Signed)
IV was removed by prior nurse. Clean, dry, intact. I wheeled stable patient to short stay entrance where he was picked up by his wife. She was transporting him to the [facility] in [place]. Reviewed d/c paperwork with wife and reviewed medications. I instructed her to give the packet to the [facility]. Also gave preprinted prescription for tramadol.

## 2019-03-08 ENCOUNTER — Ambulatory Visit: Payer: Medicare Other | Admitting: Physical Therapy

## 2019-03-08 ENCOUNTER — Telehealth: Payer: Self-pay | Admitting: Family Medicine

## 2019-03-08 DIAGNOSIS — Z7901 Long term (current) use of anticoagulants: Secondary | ICD-10-CM | POA: Diagnosis not present

## 2019-03-08 DIAGNOSIS — I4891 Unspecified atrial fibrillation: Secondary | ICD-10-CM | POA: Diagnosis not present

## 2019-03-09 DIAGNOSIS — I2510 Atherosclerotic heart disease of native coronary artery without angina pectoris: Secondary | ICD-10-CM | POA: Diagnosis not present

## 2019-03-09 DIAGNOSIS — G4733 Obstructive sleep apnea (adult) (pediatric): Secondary | ICD-10-CM | POA: Diagnosis not present

## 2019-03-09 DIAGNOSIS — J449 Chronic obstructive pulmonary disease, unspecified: Secondary | ICD-10-CM | POA: Diagnosis not present

## 2019-03-09 DIAGNOSIS — I5033 Acute on chronic diastolic (congestive) heart failure: Secondary | ICD-10-CM | POA: Diagnosis not present

## 2019-03-09 NOTE — Telephone Encounter (Signed)
Faxed

## 2019-03-10 ENCOUNTER — Encounter: Payer: BLUE CROSS/BLUE SHIELD | Admitting: Physical Therapy

## 2019-03-11 DIAGNOSIS — Z7901 Long term (current) use of anticoagulants: Secondary | ICD-10-CM | POA: Diagnosis not present

## 2019-03-11 DIAGNOSIS — I4891 Unspecified atrial fibrillation: Secondary | ICD-10-CM | POA: Diagnosis not present

## 2019-03-14 DIAGNOSIS — I2510 Atherosclerotic heart disease of native coronary artery without angina pectoris: Secondary | ICD-10-CM | POA: Diagnosis not present

## 2019-03-14 DIAGNOSIS — I5033 Acute on chronic diastolic (congestive) heart failure: Secondary | ICD-10-CM | POA: Diagnosis not present

## 2019-03-14 DIAGNOSIS — J449 Chronic obstructive pulmonary disease, unspecified: Secondary | ICD-10-CM | POA: Diagnosis not present

## 2019-03-14 DIAGNOSIS — Z7901 Long term (current) use of anticoagulants: Secondary | ICD-10-CM | POA: Diagnosis not present

## 2019-03-14 DIAGNOSIS — D649 Anemia, unspecified: Secondary | ICD-10-CM | POA: Diagnosis not present

## 2019-03-14 DIAGNOSIS — G4733 Obstructive sleep apnea (adult) (pediatric): Secondary | ICD-10-CM | POA: Diagnosis not present

## 2019-03-14 DIAGNOSIS — I4891 Unspecified atrial fibrillation: Secondary | ICD-10-CM | POA: Diagnosis not present

## 2019-03-15 DIAGNOSIS — E559 Vitamin D deficiency, unspecified: Secondary | ICD-10-CM | POA: Diagnosis not present

## 2019-03-15 DIAGNOSIS — I4891 Unspecified atrial fibrillation: Secondary | ICD-10-CM | POA: Diagnosis not present

## 2019-03-15 DIAGNOSIS — D649 Anemia, unspecified: Secondary | ICD-10-CM | POA: Diagnosis not present

## 2019-03-15 DIAGNOSIS — E785 Hyperlipidemia, unspecified: Secondary | ICD-10-CM | POA: Diagnosis not present

## 2019-03-15 DIAGNOSIS — I10 Essential (primary) hypertension: Secondary | ICD-10-CM | POA: Diagnosis not present

## 2019-03-16 DIAGNOSIS — I2510 Atherosclerotic heart disease of native coronary artery without angina pectoris: Secondary | ICD-10-CM | POA: Diagnosis not present

## 2019-03-16 DIAGNOSIS — R55 Syncope and collapse: Secondary | ICD-10-CM | POA: Diagnosis not present

## 2019-03-16 DIAGNOSIS — I5033 Acute on chronic diastolic (congestive) heart failure: Secondary | ICD-10-CM | POA: Diagnosis not present

## 2019-03-16 DIAGNOSIS — R0602 Shortness of breath: Secondary | ICD-10-CM | POA: Diagnosis not present

## 2019-03-16 DIAGNOSIS — J449 Chronic obstructive pulmonary disease, unspecified: Secondary | ICD-10-CM | POA: Diagnosis not present

## 2019-03-17 DIAGNOSIS — N390 Urinary tract infection, site not specified: Secondary | ICD-10-CM | POA: Diagnosis not present

## 2019-03-17 DIAGNOSIS — Z79899 Other long term (current) drug therapy: Secondary | ICD-10-CM | POA: Diagnosis not present

## 2019-03-17 DIAGNOSIS — R319 Hematuria, unspecified: Secondary | ICD-10-CM | POA: Diagnosis not present

## 2019-03-21 ENCOUNTER — Other Ambulatory Visit: Payer: Self-pay | Admitting: Pediatrics

## 2019-03-21 DIAGNOSIS — I4891 Unspecified atrial fibrillation: Secondary | ICD-10-CM | POA: Diagnosis not present

## 2019-03-21 DIAGNOSIS — R791 Abnormal coagulation profile: Secondary | ICD-10-CM | POA: Diagnosis not present

## 2019-03-21 DIAGNOSIS — I10 Essential (primary) hypertension: Secondary | ICD-10-CM | POA: Diagnosis not present

## 2019-03-21 DIAGNOSIS — D649 Anemia, unspecified: Secondary | ICD-10-CM | POA: Diagnosis not present

## 2019-03-21 DIAGNOSIS — N400 Enlarged prostate without lower urinary tract symptoms: Secondary | ICD-10-CM

## 2019-03-21 LAB — PROTIME-INR

## 2019-03-23 ENCOUNTER — Encounter: Payer: Self-pay | Admitting: Family Medicine

## 2019-03-23 ENCOUNTER — Other Ambulatory Visit: Payer: Self-pay

## 2019-03-23 ENCOUNTER — Encounter: Payer: Self-pay | Admitting: Pharmacist Clinician (PhC)/ Clinical Pharmacy Specialist

## 2019-03-23 ENCOUNTER — Ambulatory Visit (INDEPENDENT_AMBULATORY_CARE_PROVIDER_SITE_OTHER): Payer: BLUE CROSS/BLUE SHIELD | Admitting: Family Medicine

## 2019-03-23 VITALS — BP 117/74 | HR 112 | Temp 97.9°F | Ht 69.0 in | Wt 222.0 lb

## 2019-03-23 DIAGNOSIS — L03115 Cellulitis of right lower limb: Secondary | ICD-10-CM | POA: Diagnosis not present

## 2019-03-23 DIAGNOSIS — I872 Venous insufficiency (chronic) (peripheral): Secondary | ICD-10-CM

## 2019-03-23 DIAGNOSIS — I87301 Chronic venous hypertension (idiopathic) without complications of right lower extremity: Secondary | ICD-10-CM

## 2019-03-23 MED ORDER — AMOXICILLIN-POT CLAVULANATE 875-125 MG PO TABS: 1.0000 | ORAL_TABLET | Freq: Two times a day (BID) | ORAL | 0 refills | Status: DC
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
## 2019-03-23 NOTE — Patient Instructions (Addendum)
Increase lasix to one on arising and another 4-6 hours later for one week only. Keep right leg elevated.  Ultrasound to be arranged for Thursday. Take Augmentin twice daily with food.

## 2019-03-24 ENCOUNTER — Other Ambulatory Visit: Payer: Self-pay | Admitting: Family Medicine

## 2019-03-24 ENCOUNTER — Ambulatory Visit
Admission: RE | Admit: 2019-03-24 | Discharge: 2019-03-24 | Disposition: A | Discharge status: Home / Self Care | Payer: BLUE CROSS/BLUE SHIELD | Source: Ambulatory Visit | Attending: Family Medicine | Admitting: Family Medicine

## 2019-03-24 DIAGNOSIS — Z9981 Dependence on supplemental oxygen: Secondary | ICD-10-CM | POA: Diagnosis not present

## 2019-03-24 DIAGNOSIS — I255 Ischemic cardiomyopathy: Secondary | ICD-10-CM | POA: Diagnosis not present

## 2019-03-24 DIAGNOSIS — R600 Localized edema: Secondary | ICD-10-CM | POA: Diagnosis not present

## 2019-03-24 DIAGNOSIS — J9610 Chronic respiratory failure, unspecified whether with hypoxia or hypercapnia: Secondary | ICD-10-CM | POA: Diagnosis not present

## 2019-03-24 DIAGNOSIS — R52 Pain, unspecified: Secondary | ICD-10-CM

## 2019-03-24 DIAGNOSIS — Z95810 Presence of automatic (implantable) cardiac defibrillator: Secondary | ICD-10-CM | POA: Diagnosis not present

## 2019-03-24 DIAGNOSIS — G4733 Obstructive sleep apnea (adult) (pediatric): Secondary | ICD-10-CM | POA: Diagnosis not present

## 2019-03-24 DIAGNOSIS — R41841 Cognitive communication deficit: Secondary | ICD-10-CM | POA: Diagnosis not present

## 2019-03-24 DIAGNOSIS — I5033 Acute on chronic diastolic (congestive) heart failure: Secondary | ICD-10-CM | POA: Diagnosis not present

## 2019-03-24 DIAGNOSIS — J449 Chronic obstructive pulmonary disease, unspecified: Secondary | ICD-10-CM | POA: Diagnosis not present

## 2019-03-24 DIAGNOSIS — I110 Hypertensive heart disease with heart failure: Secondary | ICD-10-CM | POA: Diagnosis not present

## 2019-03-24 DIAGNOSIS — I2510 Atherosclerotic heart disease of native coronary artery without angina pectoris: Secondary | ICD-10-CM | POA: Diagnosis not present

## 2019-03-25 DIAGNOSIS — I255 Ischemic cardiomyopathy: Secondary | ICD-10-CM | POA: Diagnosis not present

## 2019-03-25 DIAGNOSIS — G4733 Obstructive sleep apnea (adult) (pediatric): Secondary | ICD-10-CM | POA: Diagnosis not present

## 2019-03-25 DIAGNOSIS — I2510 Atherosclerotic heart disease of native coronary artery without angina pectoris: Secondary | ICD-10-CM | POA: Diagnosis not present

## 2019-03-25 DIAGNOSIS — I110 Hypertensive heart disease with heart failure: Secondary | ICD-10-CM | POA: Diagnosis not present

## 2019-03-25 DIAGNOSIS — J449 Chronic obstructive pulmonary disease, unspecified: Secondary | ICD-10-CM | POA: Diagnosis not present

## 2019-03-25 DIAGNOSIS — Z95810 Presence of automatic (implantable) cardiac defibrillator: Secondary | ICD-10-CM | POA: Diagnosis not present

## 2019-03-25 DIAGNOSIS — I5033 Acute on chronic diastolic (congestive) heart failure: Secondary | ICD-10-CM | POA: Diagnosis not present

## 2019-03-25 DIAGNOSIS — R41841 Cognitive communication deficit: Secondary | ICD-10-CM | POA: Diagnosis not present

## 2019-03-25 DIAGNOSIS — J9610 Chronic respiratory failure, unspecified whether with hypoxia or hypercapnia: Secondary | ICD-10-CM | POA: Diagnosis not present

## 2019-03-25 DIAGNOSIS — Z9981 Dependence on supplemental oxygen: Secondary | ICD-10-CM | POA: Diagnosis not present

## 2019-03-25 NOTE — Patient Outreach (Signed)
Patient triggered Red on [facility] discharge Dashboard, notification sent to:  Sonda Rumble, RN

## 2019-03-26 ENCOUNTER — Encounter: Payer: Self-pay | Admitting: Family Medicine

## 2019-03-28 ENCOUNTER — Other Ambulatory Visit: Payer: Self-pay | Admitting: *Deleted

## 2019-03-28 DIAGNOSIS — I110 Hypertensive heart disease with heart failure: Secondary | ICD-10-CM | POA: Diagnosis not present

## 2019-03-28 DIAGNOSIS — I5033 Acute on chronic diastolic (congestive) heart failure: Secondary | ICD-10-CM | POA: Diagnosis not present

## 2019-03-28 DIAGNOSIS — G4733 Obstructive sleep apnea (adult) (pediatric): Secondary | ICD-10-CM | POA: Diagnosis not present

## 2019-03-28 DIAGNOSIS — R41841 Cognitive communication deficit: Secondary | ICD-10-CM | POA: Diagnosis not present

## 2019-03-28 DIAGNOSIS — J9610 Chronic respiratory failure, unspecified whether with hypoxia or hypercapnia: Secondary | ICD-10-CM | POA: Diagnosis not present

## 2019-03-28 DIAGNOSIS — J449 Chronic obstructive pulmonary disease, unspecified: Secondary | ICD-10-CM | POA: Diagnosis not present

## 2019-03-28 DIAGNOSIS — Z95810 Presence of automatic (implantable) cardiac defibrillator: Secondary | ICD-10-CM | POA: Diagnosis not present

## 2019-03-28 DIAGNOSIS — I2510 Atherosclerotic heart disease of native coronary artery without angina pectoris: Secondary | ICD-10-CM | POA: Diagnosis not present

## 2019-03-28 DIAGNOSIS — I255 Ischemic cardiomyopathy: Secondary | ICD-10-CM | POA: Diagnosis not present

## 2019-03-28 DIAGNOSIS — Z9981 Dependence on supplemental oxygen: Secondary | ICD-10-CM | POA: Diagnosis not present

## 2019-03-28 NOTE — Patient Outreach (Signed)
[facility]) Care Management  03/28/2019  Travis Cruz Apr 15, 1946 [id number]   Subjective: Telephone call to patient's home  / mobile number, male answered, states she is Travis Cruz's wife Travis Cruz), he is currently not available, will be home after 5:00pm today, advised unable to discuss nature of call without  Mr. Truss's consent, wife voices understanding, left HIPAA compliant message for Travis Cruz,  and requested call back.    Objective: Per KPN (Knowledge Performance Now, point of care tool) and chart review, patient hospitalized 03/02/2019 -03/07/2019 status post fall.  Patient inpatient 03/07/2019 - unknown discharge date for rehab at [facility] [place].    Patient has a history of COPD, CAD, Ventricular fibrillation arrest in the past with ICD placement ,acute on chronic diastolic heart failure,  Hypertension, hyperlipidemia, dementia, cancer status post radiation treatment, Ischemic cardiomyopathy ( Implantable cardioverter-defibrillator single chamber [employer] [facility]es), Obstructive sleep apnea, DVT (deep vein thrombosis), Macular degeneration, dry, and pulmonary embolus (PE).   Home oxygen 2 -3 liters.     Assessment: Received [employer] EMMI General Discharge Red Alert Flag follow up referral on 03/25/2019 and 03/28/2019.  Red Flag Triggers times 3, Day #1 patient answered yes to the following question: Unfilled prescriptions?  Day # 4, patient answered yes to the following questions: Lost interest in things? Other questions/problems?   [facility] EMMI follow up pending patient contact.      Plan: RNCM will send unsuccessful outreach letter, [facility] pamphlet, handout: Know Before You Go, will call patient for 2nd telephone outreach attempt within 4 business days, [facility] EMMI follow up, and proceed with case closure, within 10 business days if no return call.      Travis Cruz H. Annia Friendly, BSN, [place] Management [facility] Telephonic CM Phone: [phone] Fax:  [phone]

## 2019-03-29 ENCOUNTER — Other Ambulatory Visit: Payer: Self-pay | Admitting: *Deleted

## 2019-03-29 DIAGNOSIS — I5033 Acute on chronic diastolic (congestive) heart failure: Secondary | ICD-10-CM | POA: Diagnosis not present

## 2019-03-29 DIAGNOSIS — J9610 Chronic respiratory failure, unspecified whether with hypoxia or hypercapnia: Secondary | ICD-10-CM | POA: Diagnosis not present

## 2019-03-29 DIAGNOSIS — I2510 Atherosclerotic heart disease of native coronary artery without angina pectoris: Secondary | ICD-10-CM | POA: Diagnosis not present

## 2019-03-29 DIAGNOSIS — R41841 Cognitive communication deficit: Secondary | ICD-10-CM | POA: Diagnosis not present

## 2019-03-29 DIAGNOSIS — Z9981 Dependence on supplemental oxygen: Secondary | ICD-10-CM | POA: Diagnosis not present

## 2019-03-29 DIAGNOSIS — J449 Chronic obstructive pulmonary disease, unspecified: Secondary | ICD-10-CM | POA: Diagnosis not present

## 2019-03-29 DIAGNOSIS — G4733 Obstructive sleep apnea (adult) (pediatric): Secondary | ICD-10-CM | POA: Diagnosis not present

## 2019-03-29 DIAGNOSIS — Z95810 Presence of automatic (implantable) cardiac defibrillator: Secondary | ICD-10-CM | POA: Diagnosis not present

## 2019-03-29 DIAGNOSIS — I255 Ischemic cardiomyopathy: Secondary | ICD-10-CM | POA: Diagnosis not present

## 2019-03-29 DIAGNOSIS — I110 Hypertensive heart disease with heart failure: Secondary | ICD-10-CM | POA: Diagnosis not present

## 2019-03-29 NOTE — Patient Outreach (Addendum)
[facility]) Care Management  03/29/2019  Travis Cruz 08-04-1946 [id number]   Subjective: Received voicemail from Travis Cruz, states he is returning call, gave date of birth, states it is okay to speak with wife Travis Cruz) and requested call back to wife. Telephone call to patient's home / mobile number, spoke with patient's wife (Travis Cruz),stated patient's name, date of birth, and address.  Discussed [facility] Care Management BCBS EMMI General Discharge Red Alert Flag follow up, patient voiced understanding, and is in agreement to follow up on patient's behalf.  States patient is doing okay, brought patient home from [facility] on 03/23/2019, was admitted to [facility] on 03/07/2019, when she got patient home noticed an issue with right leg, taken to primary provider's office on 03/23/2019, diagnosed with right leg cellulitis, started on antibiotics, ultrasound negative for DVT,  and has a follow up visit with primary MD on 04/04/2019.    States patient is currently receiving home health services (physical therapy, speech therapy, and nursing) via [facility]) and services going well.   Wife states she is planning to follow up with BCBS regarding durable medical equipment insurance payment and patient has already met out of pocket max for the calendar year.   States she remembers patient receiving EMMI automated calls, patient does not have any unfilled medications, has little interest in things which is his baseline, likes to watch TV, and she / patient do not have any questions.  Wife states she and her daughter need education on dementia, how to care for someone with dementia, wife in agreement to receiving the following EMMI handouts: Dementia (Including Alzheimer Disease), Tips for Caregivers of People With Alzheimer Disease.   Wife states patient does not have any EMMI follow up, care coordination, care management, disease monitoring, transportation,  community resource, or pharmacy needs at this time.  States she is very appreciative of the follow up and is in agreement to receive [place] Management information.      Objective: Per KPN (Knowledge Performance Now, point of care tool) and chart review, patient hospitalized 03/02/2019 -03/07/2019 status post fall.  Patient inpatient 03/07/2019 - unknown discharge date for rehab at [facility].    Patient has a history of COPD, CAD, Ventricular fibrillation arrest in the past with ICD placement ,acute on chronic diastolic heart failure,  Hypertension, hyperlipidemia, dementia, cancer status post radiation treatment, Ischemic cardiomyopathy ( Implantable cardioverter-defibrillator single chamber [place]), Obstructive sleep apnea, DVT (deep vein thrombosis), Macular degeneration, dry, and pulmonary embolus (PE).   Home oxygen 2 -3 liters.     Assessment: Received BCBS EMMI General Discharge Red Alert Flag follow up referral on 03/25/2019 and 03/28/2019.  Red Flag Triggers times 3, Day #1 patient answered yes to the following question: Unfilled prescriptions?  Day # 4, patient answered yes to the following questions: Lost interest in things? Other questions/problems?   EMMI follow up completed and no further care management needs.      Plan: RNCM will send patient successful outreach letter, [facility] pamphlet, educational handouts, and magnet. RNCM will complete case closure due to follow up completed / no care management needs.       Travis Cruz H. Annia Friendly, BSN, [place] Management [facility] Telephonic CM Phone: [phone] Fax: [phone]

## 2019-03-31 ENCOUNTER — Telehealth: Payer: Self-pay

## 2019-03-31 DIAGNOSIS — J449 Chronic obstructive pulmonary disease, unspecified: Secondary | ICD-10-CM | POA: Diagnosis not present

## 2019-03-31 DIAGNOSIS — I110 Hypertensive heart disease with heart failure: Secondary | ICD-10-CM | POA: Diagnosis not present

## 2019-03-31 DIAGNOSIS — G4733 Obstructive sleep apnea (adult) (pediatric): Secondary | ICD-10-CM | POA: Diagnosis not present

## 2019-03-31 DIAGNOSIS — I255 Ischemic cardiomyopathy: Secondary | ICD-10-CM | POA: Diagnosis not present

## 2019-03-31 DIAGNOSIS — Z9981 Dependence on supplemental oxygen: Secondary | ICD-10-CM | POA: Diagnosis not present

## 2019-03-31 DIAGNOSIS — R41841 Cognitive communication deficit: Secondary | ICD-10-CM | POA: Diagnosis not present

## 2019-03-31 DIAGNOSIS — Z95810 Presence of automatic (implantable) cardiac defibrillator: Secondary | ICD-10-CM | POA: Diagnosis not present

## 2019-03-31 DIAGNOSIS — J9610 Chronic respiratory failure, unspecified whether with hypoxia or hypercapnia: Secondary | ICD-10-CM | POA: Diagnosis not present

## 2019-03-31 DIAGNOSIS — I5033 Acute on chronic diastolic (congestive) heart failure: Secondary | ICD-10-CM | POA: Diagnosis not present

## 2019-03-31 DIAGNOSIS — I2510 Atherosclerotic heart disease of native coronary artery without angina pectoris: Secondary | ICD-10-CM | POA: Diagnosis not present

## 2019-03-31 MED ORDER — AMOXICILLIN-POT CLAVULANATE 875-125 MG PO TABS: 1.0000 | ORAL_TABLET | Freq: Two times a day (BID) | ORAL | 0 refills | Status: DC

## 2019-03-31 NOTE — Telephone Encounter (Signed)
Travis Cruz called from [facility]-  PT- 35.4 INR- 3.0  Patient seen Dr. Livia Snellen 3/25 for right calf cellulitis- Travis Cruz from [facility] checked and states that his cellulitis is red, warm but not hot to the touch and does have an open spot on it this morning but not draining. Patient has 2 more days left on abx.   Patient had an apt to re check cellulitis but since health conditions home health was going to check it and report to [place].  Please review and advise.

## 2019-03-31 NOTE — Telephone Encounter (Signed)
They should probably recheck either tomorrow or Monday and then again 2 weeks from now for his INR.  For now continue on same dose and check the INR and let us know and we will adjust accordingly if we need to.

## 2019-03-31 NOTE — Telephone Encounter (Signed)
Please let the patient know that due to what home health care set I sent more of the Augmentin for him to extend the time.  That he is on it, if it continues to worsen then let us know.

## 2019-03-31 NOTE — Telephone Encounter (Signed)
[place] that an antibiotic was sent in. Nurse verbalized understanding. Sent message back to provider to clarify about PT/INR. Does he continue same dose and when do they need to recheck?

## 2019-03-31 NOTE — Telephone Encounter (Signed)
Home health nurse notified and verbalized understanding

## 2019-04-01 ENCOUNTER — Ambulatory Visit (INDEPENDENT_AMBULATORY_CARE_PROVIDER_SITE_OTHER): Payer: BLUE CROSS/BLUE SHIELD

## 2019-04-01 ENCOUNTER — Other Ambulatory Visit: Payer: Self-pay

## 2019-04-01 DIAGNOSIS — I110 Hypertensive heart disease with heart failure: Secondary | ICD-10-CM

## 2019-04-01 DIAGNOSIS — J9610 Chronic respiratory failure, unspecified whether with hypoxia or hypercapnia: Secondary | ICD-10-CM

## 2019-04-01 DIAGNOSIS — I5033 Acute on chronic diastolic (congestive) heart failure: Secondary | ICD-10-CM | POA: Diagnosis not present

## 2019-04-01 DIAGNOSIS — I255 Ischemic cardiomyopathy: Secondary | ICD-10-CM

## 2019-04-01 DIAGNOSIS — Z79810 Long term (current) use of selective estrogen receptor modulators (SERMs): Secondary | ICD-10-CM

## 2019-04-01 DIAGNOSIS — G4733 Obstructive sleep apnea (adult) (pediatric): Secondary | ICD-10-CM

## 2019-04-01 DIAGNOSIS — I2510 Atherosclerotic heart disease of native coronary artery without angina pectoris: Secondary | ICD-10-CM

## 2019-04-01 DIAGNOSIS — J449 Chronic obstructive pulmonary disease, unspecified: Secondary | ICD-10-CM

## 2019-04-01 DIAGNOSIS — E785 Hyperlipidemia, unspecified: Secondary | ICD-10-CM | POA: Diagnosis not present

## 2019-04-01 DIAGNOSIS — Z9981 Dependence on supplemental oxygen: Secondary | ICD-10-CM

## 2019-04-01 DIAGNOSIS — R41841 Cognitive communication deficit: Secondary | ICD-10-CM

## 2019-04-04 ENCOUNTER — Other Ambulatory Visit: Payer: Self-pay

## 2019-04-04 ENCOUNTER — Ambulatory Visit: Payer: BLUE CROSS/BLUE SHIELD | Admitting: Family Medicine

## 2019-04-04 ENCOUNTER — Telehealth: Payer: Self-pay | Admitting: *Deleted

## 2019-04-04 ENCOUNTER — Ambulatory Visit (INDEPENDENT_AMBULATORY_CARE_PROVIDER_SITE_OTHER): Payer: BLUE CROSS/BLUE SHIELD | Admitting: *Deleted

## 2019-04-04 DIAGNOSIS — Z86711 Personal history of pulmonary embolism: Secondary | ICD-10-CM

## 2019-04-04 DIAGNOSIS — Z86718 Personal history of other venous thrombosis and embolism: Secondary | ICD-10-CM

## 2019-04-04 DIAGNOSIS — I5033 Acute on chronic diastolic (congestive) heart failure: Secondary | ICD-10-CM | POA: Diagnosis not present

## 2019-04-04 DIAGNOSIS — Z9981 Dependence on supplemental oxygen: Secondary | ICD-10-CM | POA: Diagnosis not present

## 2019-04-04 DIAGNOSIS — J449 Chronic obstructive pulmonary disease, unspecified: Secondary | ICD-10-CM | POA: Diagnosis not present

## 2019-04-04 DIAGNOSIS — G4733 Obstructive sleep apnea (adult) (pediatric): Secondary | ICD-10-CM | POA: Diagnosis not present

## 2019-04-04 DIAGNOSIS — I110 Hypertensive heart disease with heart failure: Secondary | ICD-10-CM | POA: Diagnosis not present

## 2019-04-04 DIAGNOSIS — I2510 Atherosclerotic heart disease of native coronary artery without angina pectoris: Secondary | ICD-10-CM | POA: Diagnosis not present

## 2019-04-04 DIAGNOSIS — Z95810 Presence of automatic (implantable) cardiac defibrillator: Secondary | ICD-10-CM | POA: Diagnosis not present

## 2019-04-04 DIAGNOSIS — J9610 Chronic respiratory failure, unspecified whether with hypoxia or hypercapnia: Secondary | ICD-10-CM | POA: Diagnosis not present

## 2019-04-04 DIAGNOSIS — I255 Ischemic cardiomyopathy: Secondary | ICD-10-CM | POA: Diagnosis not present

## 2019-04-04 DIAGNOSIS — I428 Other cardiomyopathies: Secondary | ICD-10-CM

## 2019-04-04 DIAGNOSIS — R41841 Cognitive communication deficit: Secondary | ICD-10-CM | POA: Diagnosis not present

## 2019-04-04 LAB — CUP PACEART REMOTE DEVICE CHECK
Battery Remaining Longevity: 48 mo
Battery Remaining Percentage: 47 %
Battery Voltage: 2.92 V
Brady Statistic RV Percent Paced: 1 %
Date Time Interrogation Session: 20200406080016
HighPow Impedance: 79 Ohm
HighPow Impedance: 79 Ohm
Implantable Lead Implant Date: 20140205
Implantable Lead Location: 753860
Implantable Lead Model: 181
Implantable Lead Serial Number: 323859
Implantable Pulse Generator Implant Date: 20140205
Lead Channel Impedance Value: 400 Ohm
Lead Channel Pacing Threshold Amplitude: 0.75 V
Lead Channel Pacing Threshold Pulse Width: 0.5 ms
Lead Channel Sensing Intrinsic Amplitude: 9.8 mV
Lead Channel Setting Pacing Amplitude: 2.5 V
Lead Channel Setting Pacing Pulse Width: 0.5 ms
Lead Channel Setting Sensing Sensitivity: 0.5 mV
Pulse Gen Serial Number: 7040910

## 2019-04-04 NOTE — Telephone Encounter (Signed)
LMOVM for Vaughan Basta w/ [facility] to continue current dose and recheck next Friday

## 2019-04-04 NOTE — Telephone Encounter (Signed)
Patient's INR does not seem to have been affected greatly by the antibiotic currently, he is taking the same antibiotic again, continue current dose, INR 3.0 Caryl Pina, MD [facility] 04/04/2019, 9:39 AM

## 2019-04-04 NOTE — Telephone Encounter (Signed)
TC from [place] w/ [facility] INR 3 PT 34.5 today  Also right calf does not look any worse than before, still some redness & swelling, did start antibiotic on Friday.

## 2019-04-05 DIAGNOSIS — J449 Chronic obstructive pulmonary disease, unspecified: Secondary | ICD-10-CM | POA: Diagnosis not present

## 2019-04-05 DIAGNOSIS — Z9981 Dependence on supplemental oxygen: Secondary | ICD-10-CM | POA: Diagnosis not present

## 2019-04-05 DIAGNOSIS — I2510 Atherosclerotic heart disease of native coronary artery without angina pectoris: Secondary | ICD-10-CM | POA: Diagnosis not present

## 2019-04-05 DIAGNOSIS — R41841 Cognitive communication deficit: Secondary | ICD-10-CM | POA: Diagnosis not present

## 2019-04-05 DIAGNOSIS — I255 Ischemic cardiomyopathy: Secondary | ICD-10-CM | POA: Diagnosis not present

## 2019-04-05 DIAGNOSIS — I5033 Acute on chronic diastolic (congestive) heart failure: Secondary | ICD-10-CM | POA: Diagnosis not present

## 2019-04-05 DIAGNOSIS — I110 Hypertensive heart disease with heart failure: Secondary | ICD-10-CM | POA: Diagnosis not present

## 2019-04-05 DIAGNOSIS — G4733 Obstructive sleep apnea (adult) (pediatric): Secondary | ICD-10-CM | POA: Diagnosis not present

## 2019-04-05 DIAGNOSIS — Z95810 Presence of automatic (implantable) cardiac defibrillator: Secondary | ICD-10-CM | POA: Diagnosis not present

## 2019-04-05 DIAGNOSIS — J9610 Chronic respiratory failure, unspecified whether with hypoxia or hypercapnia: Secondary | ICD-10-CM | POA: Diagnosis not present

## 2019-04-07 DIAGNOSIS — R41841 Cognitive communication deficit: Secondary | ICD-10-CM | POA: Diagnosis not present

## 2019-04-07 DIAGNOSIS — Z95810 Presence of automatic (implantable) cardiac defibrillator: Secondary | ICD-10-CM | POA: Diagnosis not present

## 2019-04-07 DIAGNOSIS — I5033 Acute on chronic diastolic (congestive) heart failure: Secondary | ICD-10-CM | POA: Diagnosis not present

## 2019-04-07 DIAGNOSIS — J9610 Chronic respiratory failure, unspecified whether with hypoxia or hypercapnia: Secondary | ICD-10-CM | POA: Diagnosis not present

## 2019-04-07 DIAGNOSIS — G4733 Obstructive sleep apnea (adult) (pediatric): Secondary | ICD-10-CM | POA: Diagnosis not present

## 2019-04-07 DIAGNOSIS — I110 Hypertensive heart disease with heart failure: Secondary | ICD-10-CM | POA: Diagnosis not present

## 2019-04-07 DIAGNOSIS — Z9981 Dependence on supplemental oxygen: Secondary | ICD-10-CM | POA: Diagnosis not present

## 2019-04-07 DIAGNOSIS — I255 Ischemic cardiomyopathy: Secondary | ICD-10-CM | POA: Diagnosis not present

## 2019-04-07 DIAGNOSIS — J449 Chronic obstructive pulmonary disease, unspecified: Secondary | ICD-10-CM | POA: Diagnosis not present

## 2019-04-07 DIAGNOSIS — I2510 Atherosclerotic heart disease of native coronary artery without angina pectoris: Secondary | ICD-10-CM | POA: Diagnosis not present

## 2019-04-11 DIAGNOSIS — J9610 Chronic respiratory failure, unspecified whether with hypoxia or hypercapnia: Secondary | ICD-10-CM | POA: Diagnosis not present

## 2019-04-11 DIAGNOSIS — I2510 Atherosclerotic heart disease of native coronary artery without angina pectoris: Secondary | ICD-10-CM | POA: Diagnosis not present

## 2019-04-11 DIAGNOSIS — G4733 Obstructive sleep apnea (adult) (pediatric): Secondary | ICD-10-CM | POA: Diagnosis not present

## 2019-04-11 DIAGNOSIS — I110 Hypertensive heart disease with heart failure: Secondary | ICD-10-CM | POA: Diagnosis not present

## 2019-04-11 DIAGNOSIS — R41841 Cognitive communication deficit: Secondary | ICD-10-CM | POA: Diagnosis not present

## 2019-04-11 DIAGNOSIS — J449 Chronic obstructive pulmonary disease, unspecified: Secondary | ICD-10-CM | POA: Diagnosis not present

## 2019-04-11 DIAGNOSIS — Z95810 Presence of automatic (implantable) cardiac defibrillator: Secondary | ICD-10-CM | POA: Diagnosis not present

## 2019-04-11 DIAGNOSIS — Z9981 Dependence on supplemental oxygen: Secondary | ICD-10-CM | POA: Diagnosis not present

## 2019-04-11 DIAGNOSIS — I255 Ischemic cardiomyopathy: Secondary | ICD-10-CM | POA: Diagnosis not present

## 2019-04-11 DIAGNOSIS — I5033 Acute on chronic diastolic (congestive) heart failure: Secondary | ICD-10-CM | POA: Diagnosis not present

## 2019-04-12 NOTE — Progress Notes (Signed)
Remote ICD transmission.   

## 2019-04-13 DIAGNOSIS — I110 Hypertensive heart disease with heart failure: Secondary | ICD-10-CM | POA: Diagnosis not present

## 2019-04-13 DIAGNOSIS — J9610 Chronic respiratory failure, unspecified whether with hypoxia or hypercapnia: Secondary | ICD-10-CM | POA: Diagnosis not present

## 2019-04-13 DIAGNOSIS — R41841 Cognitive communication deficit: Secondary | ICD-10-CM | POA: Diagnosis not present

## 2019-04-13 DIAGNOSIS — I255 Ischemic cardiomyopathy: Secondary | ICD-10-CM | POA: Diagnosis not present

## 2019-04-13 DIAGNOSIS — G4733 Obstructive sleep apnea (adult) (pediatric): Secondary | ICD-10-CM | POA: Diagnosis not present

## 2019-04-13 DIAGNOSIS — I5033 Acute on chronic diastolic (congestive) heart failure: Secondary | ICD-10-CM | POA: Diagnosis not present

## 2019-04-13 DIAGNOSIS — I2510 Atherosclerotic heart disease of native coronary artery without angina pectoris: Secondary | ICD-10-CM | POA: Diagnosis not present

## 2019-04-13 DIAGNOSIS — Z9981 Dependence on supplemental oxygen: Secondary | ICD-10-CM | POA: Diagnosis not present

## 2019-04-13 DIAGNOSIS — J449 Chronic obstructive pulmonary disease, unspecified: Secondary | ICD-10-CM | POA: Diagnosis not present

## 2019-04-13 DIAGNOSIS — Z95810 Presence of automatic (implantable) cardiac defibrillator: Secondary | ICD-10-CM | POA: Diagnosis not present

## 2019-04-15 ENCOUNTER — Telehealth: Payer: Self-pay | Admitting: *Deleted

## 2019-04-15 DIAGNOSIS — I5033 Acute on chronic diastolic (congestive) heart failure: Secondary | ICD-10-CM | POA: Diagnosis not present

## 2019-04-15 DIAGNOSIS — Z95810 Presence of automatic (implantable) cardiac defibrillator: Secondary | ICD-10-CM | POA: Diagnosis not present

## 2019-04-15 DIAGNOSIS — J449 Chronic obstructive pulmonary disease, unspecified: Secondary | ICD-10-CM | POA: Diagnosis not present

## 2019-04-15 DIAGNOSIS — Z86718 Personal history of other venous thrombosis and embolism: Secondary | ICD-10-CM

## 2019-04-15 DIAGNOSIS — Z9981 Dependence on supplemental oxygen: Secondary | ICD-10-CM | POA: Diagnosis not present

## 2019-04-15 DIAGNOSIS — G4733 Obstructive sleep apnea (adult) (pediatric): Secondary | ICD-10-CM | POA: Diagnosis not present

## 2019-04-15 DIAGNOSIS — I255 Ischemic cardiomyopathy: Secondary | ICD-10-CM | POA: Diagnosis not present

## 2019-04-15 DIAGNOSIS — I2510 Atherosclerotic heart disease of native coronary artery without angina pectoris: Secondary | ICD-10-CM | POA: Diagnosis not present

## 2019-04-15 DIAGNOSIS — Z86711 Personal history of pulmonary embolism: Secondary | ICD-10-CM

## 2019-04-15 DIAGNOSIS — I110 Hypertensive heart disease with heart failure: Secondary | ICD-10-CM | POA: Diagnosis not present

## 2019-04-15 DIAGNOSIS — J9610 Chronic respiratory failure, unspecified whether with hypoxia or hypercapnia: Secondary | ICD-10-CM | POA: Diagnosis not present

## 2019-04-15 DIAGNOSIS — R41841 Cognitive communication deficit: Secondary | ICD-10-CM | POA: Diagnosis not present

## 2019-04-15 NOTE — Telephone Encounter (Addendum)
LMOVM flor [place] w/ [facility] on new dosage of Coumadin

## 2019-04-15 NOTE — Telephone Encounter (Signed)
LMOVM for family on new dosage of Coumadin

## 2019-04-15 NOTE — Telephone Encounter (Signed)
Description   Take 1-1/2 tablets or 3.75 mg on Monday and Thursday and continue 2.5mg  all other days of the week.  INR 1.7 today (goal is 2-3)  Slightly thick today       Caryl Pina, MD [facility] 04/15/2019, 2:36 PM

## 2019-04-15 NOTE — Telephone Encounter (Signed)
TC from [place] w/ [facility] INR 1.7 PT 20.8 Coumadin 2.5 mg daily

## 2019-04-18 DIAGNOSIS — G4733 Obstructive sleep apnea (adult) (pediatric): Secondary | ICD-10-CM | POA: Diagnosis not present

## 2019-04-18 DIAGNOSIS — I255 Ischemic cardiomyopathy: Secondary | ICD-10-CM | POA: Diagnosis not present

## 2019-04-18 DIAGNOSIS — Z95810 Presence of automatic (implantable) cardiac defibrillator: Secondary | ICD-10-CM | POA: Diagnosis not present

## 2019-04-18 DIAGNOSIS — Z9981 Dependence on supplemental oxygen: Secondary | ICD-10-CM | POA: Diagnosis not present

## 2019-04-18 DIAGNOSIS — I110 Hypertensive heart disease with heart failure: Secondary | ICD-10-CM | POA: Diagnosis not present

## 2019-04-18 DIAGNOSIS — I5033 Acute on chronic diastolic (congestive) heart failure: Secondary | ICD-10-CM | POA: Diagnosis not present

## 2019-04-18 DIAGNOSIS — I2510 Atherosclerotic heart disease of native coronary artery without angina pectoris: Secondary | ICD-10-CM | POA: Diagnosis not present

## 2019-04-18 DIAGNOSIS — J9610 Chronic respiratory failure, unspecified whether with hypoxia or hypercapnia: Secondary | ICD-10-CM | POA: Diagnosis not present

## 2019-04-18 DIAGNOSIS — R41841 Cognitive communication deficit: Secondary | ICD-10-CM | POA: Diagnosis not present

## 2019-04-18 DIAGNOSIS — J449 Chronic obstructive pulmonary disease, unspecified: Secondary | ICD-10-CM | POA: Diagnosis not present

## 2019-04-20 DIAGNOSIS — J449 Chronic obstructive pulmonary disease, unspecified: Secondary | ICD-10-CM | POA: Diagnosis not present

## 2019-04-20 DIAGNOSIS — J9610 Chronic respiratory failure, unspecified whether with hypoxia or hypercapnia: Secondary | ICD-10-CM | POA: Diagnosis not present

## 2019-04-20 DIAGNOSIS — I5033 Acute on chronic diastolic (congestive) heart failure: Secondary | ICD-10-CM | POA: Diagnosis not present

## 2019-04-20 DIAGNOSIS — I2510 Atherosclerotic heart disease of native coronary artery without angina pectoris: Secondary | ICD-10-CM | POA: Diagnosis not present

## 2019-04-20 DIAGNOSIS — I110 Hypertensive heart disease with heart failure: Secondary | ICD-10-CM | POA: Diagnosis not present

## 2019-04-20 DIAGNOSIS — Z95810 Presence of automatic (implantable) cardiac defibrillator: Secondary | ICD-10-CM | POA: Diagnosis not present

## 2019-04-20 DIAGNOSIS — I255 Ischemic cardiomyopathy: Secondary | ICD-10-CM | POA: Diagnosis not present

## 2019-04-20 DIAGNOSIS — G4733 Obstructive sleep apnea (adult) (pediatric): Secondary | ICD-10-CM | POA: Diagnosis not present

## 2019-04-20 DIAGNOSIS — Z9981 Dependence on supplemental oxygen: Secondary | ICD-10-CM | POA: Diagnosis not present

## 2019-04-20 DIAGNOSIS — R41841 Cognitive communication deficit: Secondary | ICD-10-CM | POA: Diagnosis not present

## 2019-04-28 ENCOUNTER — Other Ambulatory Visit: Payer: Self-pay | Admitting: Family Medicine

## 2019-04-28 DIAGNOSIS — E785 Hyperlipidemia, unspecified: Secondary | ICD-10-CM

## 2019-04-29 ENCOUNTER — Other Ambulatory Visit: Payer: Self-pay | Admitting: Pediatrics

## 2019-05-01 DIAGNOSIS — J449 Chronic obstructive pulmonary disease, unspecified: Secondary | ICD-10-CM | POA: Diagnosis not present

## 2019-05-01 DIAGNOSIS — E785 Hyperlipidemia, unspecified: Secondary | ICD-10-CM | POA: Diagnosis not present

## 2019-05-03 ENCOUNTER — Encounter: Payer: Self-pay | Admitting: Family Medicine

## 2019-05-03 ENCOUNTER — Telehealth: Payer: Self-pay | Admitting: Family Medicine

## 2019-05-03 DIAGNOSIS — G4733 Obstructive sleep apnea (adult) (pediatric): Secondary | ICD-10-CM | POA: Diagnosis not present

## 2019-05-03 NOTE — Telephone Encounter (Signed)
Yes this should be fine as long as he is not having a fever or new cough or SOB.

## 2019-05-03 NOTE — Telephone Encounter (Signed)
Wife aware

## 2019-05-05 DIAGNOSIS — J449 Chronic obstructive pulmonary disease, unspecified: Secondary | ICD-10-CM | POA: Diagnosis not present

## 2019-05-06 ENCOUNTER — Ambulatory Visit (INDEPENDENT_AMBULATORY_CARE_PROVIDER_SITE_OTHER): Payer: BLUE CROSS/BLUE SHIELD | Admitting: Family Medicine

## 2019-05-06 ENCOUNTER — Other Ambulatory Visit: Payer: Self-pay

## 2019-05-06 ENCOUNTER — Encounter: Payer: Self-pay | Admitting: Family Medicine

## 2019-05-06 VITALS — BP 113/71 | HR 105 | Temp 96.9°F | Ht 69.0 in | Wt 220.6 lb

## 2019-05-06 DIAGNOSIS — Z86711 Personal history of pulmonary embolism: Secondary | ICD-10-CM | POA: Diagnosis not present

## 2019-05-06 DIAGNOSIS — Z86718 Personal history of other venous thrombosis and embolism: Secondary | ICD-10-CM

## 2019-05-06 DIAGNOSIS — R600 Localized edema: Secondary | ICD-10-CM

## 2019-05-06 LAB — COAGUCHEK XS/INR WAIVED
INR: 1.6 — ABNORMAL HIGH (ref 0.9–1.1)
Prothrombin Time: 19.7 s

## 2019-05-06 MED ORDER — WARFARIN SODIUM 3 MG PO TABS: 3.0000 mg | ORAL_TABLET | Freq: Every day | ORAL | 1 refills | Status: DC

## 2019-05-06 NOTE — Progress Notes (Signed)
BP 113/71   Pulse (!) 105   Temp (!) 96.9 F (36.1 C) (Oral)   Ht 5\' 9"  (1.753 m)   Wt 220 lb 9.6 oz (100.1 kg)   BMI 32.58 kg/m    Subjective:   Patient ID: Travis Cruz, male    DOB: 1946-01-28, 73 y.o.   MRN: [id number]  HPI: Travis Cruz is a 73 y.o. male presenting on 05/06/2019 for Coagulation Disorder and Cellulitis (right calf - x 1 month )   HPI Coumadin and INR recheck Patient is coming in today for Coumadin and INR recheck.  He is been on 2 mg but taking 1-1/2 and 3 on different days and has been quartering some of the 2 mg tablets to get the half so can take 2.5 and then 3 and it is been hard for him.  Right lower extremity edema and swelling and cellulitis recheck The redness and warmth that was on his right lower leg on the calf has gone down significantly and the pain is gone down but is still has pruritus along with the swelling and redness and warmth that was there before that is now gone mostly.  The swelling is still there partially but much improved and she showed me some photos of what looked like before.  He denies any pain with ambulating although he is mostly nonambulatory because of trauma in other places.  Relevant past medical, surgical, family and social history reviewed and updated as indicated. Interim medical history since our last visit reviewed. Allergies and medications reviewed and updated.  Review of Systems  Constitutional: Negative for chills and fever.  Respiratory: Negative for shortness of breath and wheezing.   Cardiovascular: Positive for leg swelling. Negative for chest pain.  Gastrointestinal: Negative for blood in stool.  Genitourinary: Negative for hematuria.  Musculoskeletal: Negative for back pain and gait problem.  Skin: Positive for color change. Negative for rash and wound.  All other systems reviewed and are negative.   Per HPI unless specifically indicated above      Objective:   BP 113/71   Pulse (!)  105   Temp (!) 96.9 F (36.1 C) (Oral)   Ht 5\' 9"  (1.753 m)   Wt 220 lb 9.6 oz (100.1 kg)   BMI 32.58 kg/m   Wt Readings from Last 3 Encounters:  05/06/19 220 lb 9.6 oz (100.1 kg)  03/23/19 222 lb (100.7 kg)  03/03/19 218 lb 0.6 oz (98.9 kg)    Physical Exam Vitals signs and nursing note reviewed.  Constitutional:      General: He is not in acute distress.    Appearance: He is well-developed. He is not diaphoretic.  Eyes:     General: No scleral icterus.    Conjunctiva/sclera: Conjunctivae normal.  Neck:     Musculoskeletal: Neck supple.     Thyroid: No thyromegaly.  Musculoskeletal: Normal range of motion.        General: Swelling (1+ pitting edema in bilateral lower extremities) present. No tenderness.  Skin:    General: Skin is warm and dry.     Findings: No erythema or rash.  Neurological:     Mental Status: He is alert and oriented to person, place, and time.     Coordination: Coordination normal.  Psychiatric:        Behavior: Behavior normal.       Assessment & Plan:   Problem List Items Addressed This Visit      Other   History  of DVT (deep vein thrombosis) - Primary   Relevant Medications   warfarin (COUMADIN) 3 MG tablet   Other Relevant Orders   CoaguChek XS/INR Waived (Completed)   History of pulmonary embolus (PE)   Relevant Medications   warfarin (COUMADIN) 3 MG tablet    Other Visit Diagnoses    Edema of right lower extremity       Relevant Orders   Compression stockings      Redness and warmth seems to be gone, still slightly swollen, will do compression stockings and lots of lotion the will monitor for now.  Description   Take 1 -2 and 1 -3 mg tablet for 5 mg on Monday and Thursday and continue 3mg  all other days of the week.  INR 1.6 today (goal is 2-3)  Slightly thick today        Follow up plan: Return in about 1 week (around 05/13/2019), or if symptoms worsen or fail to improve, for INR recheck.  Counseling provided for all  of the vaccine components Orders Placed This Encounter  Procedures  . Compression stockings  . CoaguChek XS/INR [place], MD [facility] 05/06/2019, 3:27 PM

## 2019-05-10 ENCOUNTER — Encounter: Payer: Self-pay | Admitting: Family Medicine

## 2019-05-12 ENCOUNTER — Other Ambulatory Visit: Payer: Self-pay

## 2019-05-13 ENCOUNTER — Encounter: Payer: Self-pay | Admitting: Family Medicine

## 2019-05-13 ENCOUNTER — Ambulatory Visit (INDEPENDENT_AMBULATORY_CARE_PROVIDER_SITE_OTHER): Payer: BLUE CROSS/BLUE SHIELD | Admitting: Family Medicine

## 2019-05-13 DIAGNOSIS — Z86718 Personal history of other venous thrombosis and embolism: Secondary | ICD-10-CM | POA: Diagnosis not present

## 2019-05-13 DIAGNOSIS — I255 Ischemic cardiomyopathy: Secondary | ICD-10-CM

## 2019-05-13 DIAGNOSIS — Z86711 Personal history of pulmonary embolism: Secondary | ICD-10-CM | POA: Diagnosis not present

## 2019-05-13 LAB — COAGUCHEK XS/INR WAIVED
INR: 1.9 — ABNORMAL HIGH (ref 0.9–1.1)
Prothrombin Time: 22.8 s

## 2019-05-13 NOTE — Progress Notes (Signed)
   BP 115/73   Pulse 79   Temp 97.7 F (36.5 C) (Oral)   Ht 5\' 9"  (1.753 m)   Wt 220 lb 3.2 oz (99.9 kg)   BMI 32.52 kg/m    Subjective:   Patient ID: Travis Cruz, male    DOB: 1946/08/07, 73 y.o.   MRN: [id number]  HPI: Mandrell Vangilder is a 73 y.o. male presenting on 05/13/2019 for Coagulation Disorder   HPI History of DVT and PE and chronic anticoagulation Patient's INR is 1.9 today, he has a history of DVT and PE and his goal is 2.0-3.0.  He denies any bruising or bleeding.  He denies any chest pain or palpitations.  Relevant past medical, surgical, family and social history reviewed and updated as indicated. Interim medical history since our last visit reviewed. Allergies and medications reviewed and updated.  Review of Systems  Constitutional: Negative for chills and fever.  Respiratory: Negative for shortness of breath and wheezing.   Cardiovascular: Negative for chest pain and leg swelling.  Musculoskeletal: Negative for back pain and gait problem.  Skin: Negative for rash.  All other systems reviewed and are negative.   Per HPI unless specifically indicated above      Objective:   BP 115/73   Pulse 79   Temp 97.7 F (36.5 C) (Oral)   Ht 5\' 9"  (1.753 m)   Wt 220 lb 3.2 oz (99.9 kg)   BMI 32.52 kg/m   Wt Readings from Last 3 Encounters:  05/13/19 220 lb 3.2 oz (99.9 kg)  05/06/19 220 lb 9.6 oz (100.1 kg)  03/23/19 222 lb (100.7 kg)    Physical Exam Vitals signs and nursing note reviewed.  Constitutional:      General: He is not in acute distress.    Appearance: He is well-developed. He is not diaphoretic.     Comments: Patient is on oxygen 2 L nasal cannula in the office  Eyes:     General: No scleral icterus.    Conjunctiva/sclera: Conjunctivae normal.  Neck:     Thyroid: No thyromegaly.  Musculoskeletal: Normal range of motion.  Skin:    General: Skin is warm and dry.     Findings: No rash.  Neurological:     Mental Status: He is  alert and oriented to person, place, and time.     Coordination: Coordination normal.  Psychiatric:        Behavior: Behavior normal.     Description   Take 1 extra 3 mg today and then continue on 3 mg every day  INR 1.9 today (goal is 2-3)  Slightly thick today         Assessment & Plan:   Problem List Items Addressed This Visit      Other   History of DVT (deep vein thrombosis)   History of pulmonary embolus (PE)       Follow up plan: Return in about 2 weeks (around 05/27/2019), or if symptoms worsen or fail to improve, for We will continue to try to get home monitoring but if not 2-week INR.  Counseling provided for all of the vaccine components No orders of the defined types were placed in this encounter.   Caryl Pina, MD [facility] 05/13/2019, 9:11 AM

## 2019-05-13 NOTE — Addendum Note (Signed)
Addended by: Karle Plumber on: 05/13/2019 09:33 AM   Modules accepted: Orders

## 2019-05-18 ENCOUNTER — Telehealth: Payer: Self-pay | Admitting: *Deleted

## 2019-05-18 DIAGNOSIS — Z86718 Personal history of other venous thrombosis and embolism: Secondary | ICD-10-CM

## 2019-05-18 DIAGNOSIS — Z86711 Personal history of pulmonary embolism: Secondary | ICD-10-CM

## 2019-05-18 NOTE — Telephone Encounter (Signed)
Wife aware per dpr.  

## 2019-05-18 NOTE — Telephone Encounter (Signed)
Fax received mdINR PT/INR self testing service Test date/time 05/18/19 1:55 pm INR 2.8

## 2019-05-18 NOTE — Telephone Encounter (Signed)
Description   continue on 3 mg every day  INR 2.8 today (goal is 2-3)

## 2019-05-25 DIAGNOSIS — H52223 Regular astigmatism, bilateral: Secondary | ICD-10-CM | POA: Diagnosis not present

## 2019-05-26 ENCOUNTER — Telehealth: Payer: Self-pay | Admitting: *Deleted

## 2019-05-26 ENCOUNTER — Encounter: Payer: Self-pay | Admitting: *Deleted

## 2019-05-26 DIAGNOSIS — Z86711 Personal history of pulmonary embolism: Secondary | ICD-10-CM

## 2019-05-26 DIAGNOSIS — Z86718 Personal history of other venous thrombosis and embolism: Secondary | ICD-10-CM

## 2019-05-26 NOTE — Telephone Encounter (Signed)
Wife aware per dpr and verbalizes understanding.

## 2019-05-26 NOTE — Telephone Encounter (Signed)
Fax received mdINR PT/INR self testing service Test date/time 05/25/19 6:28 pm INR 2.5

## 2019-05-26 NOTE — Telephone Encounter (Signed)
Description   continue on 3 mg every day  INR 2.5  05/25/19 at home (goal is 2-3)         Caryl Pina, MD [facility] 05/26/2019, 3:36 PM

## 2019-05-27 ENCOUNTER — Other Ambulatory Visit: Payer: Self-pay | Admitting: Family Medicine

## 2019-06-01 DIAGNOSIS — J449 Chronic obstructive pulmonary disease, unspecified: Secondary | ICD-10-CM | POA: Diagnosis not present

## 2019-06-01 DIAGNOSIS — E785 Hyperlipidemia, unspecified: Secondary | ICD-10-CM | POA: Diagnosis not present

## 2019-06-02 ENCOUNTER — Telehealth: Payer: Self-pay | Admitting: *Deleted

## 2019-06-02 DIAGNOSIS — Z86711 Personal history of pulmonary embolism: Secondary | ICD-10-CM

## 2019-06-02 DIAGNOSIS — Z86718 Personal history of other venous thrombosis and embolism: Secondary | ICD-10-CM

## 2019-06-02 NOTE — Telephone Encounter (Signed)
Aware and verbalizes understanding.  

## 2019-06-02 NOTE — Telephone Encounter (Signed)
Description   continue on 3 mg every day  INR 2.3  05/25/19 at home (goal is 2-3)         Caryl Pina, MD [facility] 06/02/2019, 3:58 PM

## 2019-06-02 NOTE — Telephone Encounter (Signed)
Fax received mdINR PT/INR self testing service Test date/time 06/01/19 6:09 pm INR 2.3

## 2019-06-02 NOTE — Telephone Encounter (Signed)
lmtcb

## 2019-06-05 DIAGNOSIS — J449 Chronic obstructive pulmonary disease, unspecified: Secondary | ICD-10-CM | POA: Diagnosis not present

## 2019-06-08 DIAGNOSIS — Z7901 Long term (current) use of anticoagulants: Secondary | ICD-10-CM | POA: Diagnosis not present

## 2019-06-08 DIAGNOSIS — Z86718 Personal history of other venous thrombosis and embolism: Secondary | ICD-10-CM | POA: Diagnosis not present

## 2019-06-09 ENCOUNTER — Telehealth: Payer: Self-pay | Admitting: *Deleted

## 2019-06-09 DIAGNOSIS — Z86718 Personal history of other venous thrombosis and embolism: Secondary | ICD-10-CM

## 2019-06-09 DIAGNOSIS — Z86711 Personal history of pulmonary embolism: Secondary | ICD-10-CM

## 2019-06-09 NOTE — Telephone Encounter (Signed)
Aware. 

## 2019-06-09 NOTE — Telephone Encounter (Signed)
Fax received mdINR PT/INR self testing service Test date/time 06/08/19 5:37 pm INR 2.1

## 2019-06-09 NOTE — Telephone Encounter (Signed)
Description   continue on 3 mg every day  INR 2.1  05/25/19 at home (goal is 2-3)        Caryl Pina, MD [facility] 06/09/2019, 11:37 AM

## 2019-06-14 ENCOUNTER — Other Ambulatory Visit: Payer: Self-pay | Admitting: Family Medicine

## 2019-06-14 DIAGNOSIS — N400 Enlarged prostate without lower urinary tract symptoms: Secondary | ICD-10-CM

## 2019-06-16 ENCOUNTER — Telehealth: Payer: Self-pay | Admitting: *Deleted

## 2019-06-16 DIAGNOSIS — Z86718 Personal history of other venous thrombosis and embolism: Secondary | ICD-10-CM

## 2019-06-16 DIAGNOSIS — Z86711 Personal history of pulmonary embolism: Secondary | ICD-10-CM

## 2019-06-16 NOTE — Telephone Encounter (Signed)
[name] aware for patient to continue 3mg  daily

## 2019-06-16 NOTE — Telephone Encounter (Signed)
Description   continue on 3 mg every day  INR 2.3  05/25/19 at home (goal is 2-3)        Caryl Pina, MD [facility] 06/16/2019, 3:00 PM

## 2019-06-16 NOTE — Telephone Encounter (Signed)
Fax received mdINR PT/INR self testing service Test date/time 06/15/19 5:36 pm INR 2.3

## 2019-06-20 ENCOUNTER — Other Ambulatory Visit: Payer: Self-pay | Admitting: Family Medicine

## 2019-06-22 ENCOUNTER — Telehealth: Payer: Self-pay | Admitting: Family Medicine

## 2019-06-22 NOTE — Telephone Encounter (Signed)
Wife will obtain INR when supplies come in

## 2019-06-24 ENCOUNTER — Telehealth: Payer: Self-pay | Admitting: *Deleted

## 2019-06-24 NOTE — Telephone Encounter (Signed)
MED INR fax  protime checked 530pm 06/23/19.  INR range is 2-3 INR result 2.8  Currently on 3 mg QD

## 2019-06-24 NOTE — Telephone Encounter (Signed)
Continue coumadin as is. Recheck weekly

## 2019-06-24 NOTE — Telephone Encounter (Signed)
Pt aware of MD feedback and voiced understanding. 

## 2019-06-30 ENCOUNTER — Telehealth: Payer: Self-pay | Admitting: Family Medicine

## 2019-06-30 DIAGNOSIS — Z86711 Personal history of pulmonary embolism: Secondary | ICD-10-CM

## 2019-06-30 DIAGNOSIS — Z86718 Personal history of other venous thrombosis and embolism: Secondary | ICD-10-CM

## 2019-06-30 NOTE — Telephone Encounter (Signed)
Fax received mdINR PT/INR self testing service Test date/time 07/02/200  10:18:57am INR 1.7

## 2019-07-01 DIAGNOSIS — E785 Hyperlipidemia, unspecified: Secondary | ICD-10-CM | POA: Diagnosis not present

## 2019-07-01 DIAGNOSIS — J449 Chronic obstructive pulmonary disease, unspecified: Secondary | ICD-10-CM | POA: Diagnosis not present

## 2019-07-01 NOTE — Telephone Encounter (Signed)
Wife aware of results and dosing

## 2019-07-01 NOTE — Telephone Encounter (Signed)
Description   Double up tonight and then continue on 3 mg every day  INR 1.7  05/25/19 at home (goal is 2-3)       Caryl Pina, MD [place] 07/01/2019, 9:25 AM

## 2019-07-04 ENCOUNTER — Ambulatory Visit (INDEPENDENT_AMBULATORY_CARE_PROVIDER_SITE_OTHER): Payer: BC Managed Care – PPO | Admitting: *Deleted

## 2019-07-04 DIAGNOSIS — I255 Ischemic cardiomyopathy: Secondary | ICD-10-CM

## 2019-07-04 LAB — CUP PACEART REMOTE DEVICE CHECK
Date Time Interrogation Session: 20200706132621
Implantable Lead Implant Date: 20140205
Implantable Lead Location: 753860
Implantable Lead Model: 181
Implantable Lead Serial Number: 323859
Implantable Pulse Generator Implant Date: 20140205
Pulse Gen Serial Number: 7040910

## 2019-07-05 DIAGNOSIS — J449 Chronic obstructive pulmonary disease, unspecified: Secondary | ICD-10-CM | POA: Diagnosis not present

## 2019-07-06 DIAGNOSIS — Z7901 Long term (current) use of anticoagulants: Secondary | ICD-10-CM | POA: Diagnosis not present

## 2019-07-06 DIAGNOSIS — Z86718 Personal history of other venous thrombosis and embolism: Secondary | ICD-10-CM | POA: Diagnosis not present

## 2019-07-07 ENCOUNTER — Telehealth: Payer: Self-pay | Admitting: *Deleted

## 2019-07-07 DIAGNOSIS — Z86718 Personal history of other venous thrombosis and embolism: Secondary | ICD-10-CM

## 2019-07-07 DIAGNOSIS — Z86711 Personal history of pulmonary embolism: Secondary | ICD-10-CM

## 2019-07-07 NOTE — Telephone Encounter (Signed)
Description    continue on 3 mg every day  INR 2.6  05/25/19 at home (goal is 2-3)        Travis Pina, MD [facility] 07/07/2019, 1:00 PM

## 2019-07-07 NOTE — Telephone Encounter (Signed)
Wife aware

## 2019-07-07 NOTE — Telephone Encounter (Signed)
Fax received mdINR PT/INR self testing service Test date/time 07/06/19 6:09 pm INR 2.6

## 2019-07-14 ENCOUNTER — Other Ambulatory Visit: Payer: Self-pay | Admitting: Family Medicine

## 2019-07-15 ENCOUNTER — Telehealth: Payer: Self-pay

## 2019-07-15 ENCOUNTER — Encounter: Payer: Self-pay | Admitting: Cardiology

## 2019-07-15 DIAGNOSIS — Z86711 Personal history of pulmonary embolism: Secondary | ICD-10-CM

## 2019-07-15 DIAGNOSIS — Z86718 Personal history of other venous thrombosis and embolism: Secondary | ICD-10-CM

## 2019-07-15 LAB — POCT INR: INR: 2.3 (ref 2–3)

## 2019-07-15 NOTE — Telephone Encounter (Signed)
Description    continue on 3 mg every day  INR 2.3  05/25/19 at home (goal is 2-3)        Caryl Pina, MD [facility] 07/15/2019, 12:47 PM

## 2019-07-15 NOTE — Progress Notes (Signed)
Remote ICD transmission.   

## 2019-07-15 NOTE — Telephone Encounter (Signed)
Aware. 

## 2019-07-15 NOTE — Telephone Encounter (Signed)
PT/INR results are 2.3. Please advise

## 2019-07-21 ENCOUNTER — Telehealth: Payer: Self-pay | Admitting: *Deleted

## 2019-07-21 DIAGNOSIS — Z86711 Personal history of pulmonary embolism: Secondary | ICD-10-CM

## 2019-07-21 DIAGNOSIS — Z86718 Personal history of other venous thrombosis and embolism: Secondary | ICD-10-CM

## 2019-07-21 NOTE — Telephone Encounter (Signed)
Pt aware.

## 2019-07-21 NOTE — Telephone Encounter (Signed)
Fax received mdINR PT/INR self testing service Test date/time 07/20/19 6:00 pm INR 2.5

## 2019-07-21 NOTE — Telephone Encounter (Signed)
Description    continue on 3 mg every day  INR 2.5  07/21/19 at home (goal is 2-3)   Recheck in 2 weeks       Caryl Pina, MD [place] 07/21/2019, 1:04 PM

## 2019-07-28 ENCOUNTER — Telehealth: Payer: Self-pay | Admitting: *Deleted

## 2019-07-28 DIAGNOSIS — Z86718 Personal history of other venous thrombosis and embolism: Secondary | ICD-10-CM

## 2019-07-28 DIAGNOSIS — Z86711 Personal history of pulmonary embolism: Secondary | ICD-10-CM

## 2019-07-28 NOTE — Telephone Encounter (Signed)
Description    continue on 3 mg every day  INR 2.5  07/28/19 at home (goal is 2-3)   Recheck in 2 weeks        Caryl Pina, MD [place] 07/28/2019, 11:55 AM

## 2019-07-28 NOTE — Telephone Encounter (Signed)
Fax received mdINR PT/INR self testing service Test date/time 07/28/19 6:57 am INR 2.5

## 2019-07-28 NOTE — Telephone Encounter (Signed)
Pt called and aware

## 2019-08-01 DIAGNOSIS — E785 Hyperlipidemia, unspecified: Secondary | ICD-10-CM | POA: Diagnosis not present

## 2019-08-01 DIAGNOSIS — J449 Chronic obstructive pulmonary disease, unspecified: Secondary | ICD-10-CM | POA: Diagnosis not present

## 2019-08-03 DIAGNOSIS — Z7901 Long term (current) use of anticoagulants: Secondary | ICD-10-CM | POA: Diagnosis not present

## 2019-08-03 DIAGNOSIS — Z86718 Personal history of other venous thrombosis and embolism: Secondary | ICD-10-CM | POA: Diagnosis not present

## 2019-08-04 ENCOUNTER — Telehealth: Payer: Self-pay | Admitting: Family Medicine

## 2019-08-04 DIAGNOSIS — Z86718 Personal history of other venous thrombosis and embolism: Secondary | ICD-10-CM

## 2019-08-04 DIAGNOSIS — Z86711 Personal history of pulmonary embolism: Secondary | ICD-10-CM

## 2019-08-04 NOTE — Telephone Encounter (Signed)
Continue on 3 mg every day  INR 2.6-Perfect!  08/04/19 at home (goal is 2-3)

## 2019-08-04 NOTE — Telephone Encounter (Signed)
Fax received mdINR PT/INR self testing service Test date/time 08/03/2019 6:09:59pm  INR 2.6

## 2019-08-04 NOTE — Telephone Encounter (Signed)
Aware. 

## 2019-08-05 DIAGNOSIS — J449 Chronic obstructive pulmonary disease, unspecified: Secondary | ICD-10-CM | POA: Diagnosis not present

## 2019-08-09 DIAGNOSIS — Z8679 Personal history of other diseases of the circulatory system: Secondary | ICD-10-CM | POA: Diagnosis not present

## 2019-08-09 DIAGNOSIS — G4733 Obstructive sleep apnea (adult) (pediatric): Secondary | ICD-10-CM | POA: Diagnosis not present

## 2019-08-09 DIAGNOSIS — R569 Unspecified convulsions: Secondary | ICD-10-CM | POA: Diagnosis not present

## 2019-08-09 DIAGNOSIS — I2510 Atherosclerotic heart disease of native coronary artery without angina pectoris: Secondary | ICD-10-CM | POA: Diagnosis not present

## 2019-08-12 ENCOUNTER — Telehealth: Payer: Self-pay | Admitting: *Deleted

## 2019-08-12 DIAGNOSIS — Z86718 Personal history of other venous thrombosis and embolism: Secondary | ICD-10-CM

## 2019-08-12 DIAGNOSIS — Z86711 Personal history of pulmonary embolism: Secondary | ICD-10-CM

## 2019-08-12 NOTE — Telephone Encounter (Signed)
Description   Hold today then resume on 3 mg every day  INR 3.2-slightly thin!  At home (goal is 2-3)   Recheck in 2 weeks       Caryl Pina, MD [place] 08/12/2019, 1:00 PM

## 2019-08-12 NOTE — Telephone Encounter (Signed)
Fax received mdINR PT/INR self testing service Test date/time 08/11/19 10:59 pm INR 3.2

## 2019-08-12 NOTE — Telephone Encounter (Signed)
Wife aware and verbalizes understanding per dpr.  °

## 2019-08-14 ENCOUNTER — Other Ambulatory Visit: Payer: Self-pay | Admitting: Family Medicine

## 2019-08-18 ENCOUNTER — Encounter: Payer: Self-pay | Admitting: Family Medicine

## 2019-08-18 ENCOUNTER — Telehealth: Payer: Self-pay | Admitting: *Deleted

## 2019-08-18 DIAGNOSIS — Z86718 Personal history of other venous thrombosis and embolism: Secondary | ICD-10-CM

## 2019-08-18 DIAGNOSIS — Z86711 Personal history of pulmonary embolism: Secondary | ICD-10-CM

## 2019-08-18 NOTE — Telephone Encounter (Signed)
Fax received mdINR PT/INR self testing service Test date/time 08/17/19 6:45 pm INR 1.7

## 2019-08-18 NOTE — Telephone Encounter (Signed)
wife aware of coumadin dosage

## 2019-08-18 NOTE — Telephone Encounter (Signed)
Description   Take 1-1/2 tablets or 4.5 mg today then resume on 3 mg every day  INR 1.7-slightly thick!  At home (goal is 2-3)  Recheck in 1 to 2 weeks  Recheck in 2 weeks       Caryl Pina, MD [place] 08/18/2019, 1:14 PM

## 2019-08-25 ENCOUNTER — Telehealth: Payer: Self-pay | Admitting: *Deleted

## 2019-08-25 DIAGNOSIS — Z86718 Personal history of other venous thrombosis and embolism: Secondary | ICD-10-CM

## 2019-08-25 DIAGNOSIS — Z86711 Personal history of pulmonary embolism: Secondary | ICD-10-CM

## 2019-08-25 NOTE — Telephone Encounter (Signed)
Patient notified and verbalized understanding. 

## 2019-08-25 NOTE — Telephone Encounter (Signed)
Fax received mdINR PT/INR self testing service Test date/time 08/24/19 6:26 pm INR 2.9

## 2019-08-25 NOTE — Telephone Encounter (Signed)
Description   Continue on 3 mg every day  INR 2.9!  At home (goal is 2-3) at goal today 08/25/2019 Recheck in 1 to 2 weeks        Caryl Pina, MD [place] 08/25/2019, 11:04 AM

## 2019-08-29 ENCOUNTER — Telehealth: Payer: Self-pay | Admitting: *Deleted

## 2019-08-29 NOTE — Telephone Encounter (Signed)
CALLED PATIENT TO INFORM OF LAB AND CT FOR 09-07-19 - 11:45 AM - ARRIVAL TIME- @  RADIOLOGY, PATIENT TO BE NPO- 4 HRS. PRIOR TO TEST, AND ASHLYN BRUNING TO CALL PATIENT WITH RESULTS ON 09-08-19 @ 9 AM, SPOKE WITH PATIENT'S WIFE- [name] AND SHE IS AWARE OF THESE APPTS.

## 2019-08-31 DIAGNOSIS — Z7901 Long term (current) use of anticoagulants: Secondary | ICD-10-CM | POA: Diagnosis not present

## 2019-08-31 DIAGNOSIS — Z86718 Personal history of other venous thrombosis and embolism: Secondary | ICD-10-CM | POA: Diagnosis not present

## 2019-09-01 ENCOUNTER — Ambulatory Visit: Payer: BC Managed Care – PPO | Admitting: Urology

## 2019-09-01 ENCOUNTER — Telehealth: Payer: Self-pay | Admitting: *Deleted

## 2019-09-01 DIAGNOSIS — Z86718 Personal history of other venous thrombosis and embolism: Secondary | ICD-10-CM

## 2019-09-01 DIAGNOSIS — E785 Hyperlipidemia, unspecified: Secondary | ICD-10-CM | POA: Diagnosis not present

## 2019-09-01 DIAGNOSIS — J449 Chronic obstructive pulmonary disease, unspecified: Secondary | ICD-10-CM | POA: Diagnosis not present

## 2019-09-01 DIAGNOSIS — Z86711 Personal history of pulmonary embolism: Secondary | ICD-10-CM

## 2019-09-01 NOTE — Telephone Encounter (Signed)
Description   Continue on 3 mg every day  INR 2.6!  At home (goal is 2-3) at goal today 08/25/2019 Recheck in 1 to 2 weeks         Caryl Pina, MD [place] 09/01/2019, 12:59 PM

## 2019-09-01 NOTE — Telephone Encounter (Signed)
Patient family aware of results

## 2019-09-01 NOTE — Telephone Encounter (Signed)
Fax received mdINR PT/INR self testing service Test date/time 08/31/19 7:10 pm INR 2.6

## 2019-09-05 DIAGNOSIS — J449 Chronic obstructive pulmonary disease, unspecified: Secondary | ICD-10-CM | POA: Diagnosis not present

## 2019-09-07 ENCOUNTER — Other Ambulatory Visit: Payer: Self-pay

## 2019-09-07 ENCOUNTER — Ambulatory Visit (HOSPITAL_COMMUNITY)
Admission: RE | Admit: 2019-09-07 | Discharge: 2019-09-07 | Disposition: A | Discharge status: Home / Self Care | Payer: BC Managed Care – PPO | Source: Ambulatory Visit | Attending: Urology | Admitting: Urology

## 2019-09-07 DIAGNOSIS — C3431 Malignant neoplasm of lower lobe, right bronchus or lung: Secondary | ICD-10-CM | POA: Insufficient documentation

## 2019-09-07 DIAGNOSIS — C3412 Malignant neoplasm of upper lobe, left bronchus or lung: Secondary | ICD-10-CM | POA: Insufficient documentation

## 2019-09-07 DIAGNOSIS — R911 Solitary pulmonary nodule: Secondary | ICD-10-CM | POA: Insufficient documentation

## 2019-09-07 DIAGNOSIS — Z85118 Personal history of other malignant neoplasm of bronchus and lung: Secondary | ICD-10-CM | POA: Diagnosis not present

## 2019-09-07 LAB — POCT I-STAT CREATININE: Creatinine, Ser: 1 mg/dL (ref 0.61–1.24)

## 2019-09-07 MED ORDER — IOHEXOL 300 MG/ML  SOLN
75.0000 mL | Freq: Once | INTRAMUSCULAR | Status: AC | PRN
  Administered 2019-09-07: 75 mL via INTRAVENOUS

## 2019-09-07 NOTE — Progress Notes (Signed)
Radiation Oncology         [phone] ________________________________  Name: Travis Cruz MRN: [id number]  Date: 09/08/2019  DOB: 10-25-1946  Post Treatment Note  CC: Dettinger, Fransisca Kaufmann, MD  Grace Isaac, MD  Diagnosis:  History of LUL NSCLC and putative NSCLC of the right lower lung - Clinical stage IA     Interval Since Last Radiation:  2 years and 5 months s/p SBRT to the RLL 03/23/2017 to 04/02/2017:  The target was treated to 50 Gy in 5 fractions of 10 Gy    11/07/13-11/16/13:  Left upper lobe / 60 Gy in 5 fractions at 12 Gray per fraction  Narrative:  I spoke with the patient to conduct his routine scheduled 6 month  follow up visit to review results from recent f/u Chest CT performed on 09/07/19 via telephone to spare the patient unnecessary potential exposure in the healthcare setting during the current COVID-19 pandemic.  The patient was notified in advance and gave permission to proceed with this visit format.  He has recovered well from the effects of radiotherapy.  CT Imaging reveals stable posttreatment changes in the perihilar left upper and right lower lobes with no evidence of local tumor recurrence in these locations.  However, there is a new indistinct 1.8 cm solid posterior right upper lobe pulmonary nodule suspicious for metastasis or metachronous primary bronchogenic carcinoma. There is no thoracic adenopathy or other potential findings of metastatic disease in the chest.  On review of systems, obtained from wife, Travis Cruz, due to patient's dementia, the patient states that he is doing well and is currently without complaints.  He specifically denies dysphagia, chest pain, productive cough, hemoptysis, fever, chills, N/V.  He reports that his appetite is good, he is maintaining his weight and his energy level is stable.  He continues on 3 L of oxygen via nasal cannula 24/7.  He denies recent unintended weight loss or night sweats.  ALLERGIES:  is allergic to ativan  [lorazepam].  Meds: Current Outpatient Medications  Medication Sig Dispense Refill   atorvastatin (LIPITOR) 80 MG tablet TAKE 1 TABLET BY MOUTH THREE TIMES PER WEEK 36 tablet 1   Cholecalciferol (VITAMIN D3) 2000 units TABS Take 1 tablet by mouth every morning.      finasteride (PROSCAR) 5 MG tablet Take 1 tablet (5 mg total) by mouth daily. 90 tablet 3   furosemide (LASIX) 20 MG tablet Take 2 tablets (40 mg total) by mouth daily. 90 tablet 1   metoprolol tartrate (LOPRESSOR) 25 MG tablet TAKE 1.5 TABLETS (37.5 MG TOTAL) BY MOUTH 2 (TWO) TIMES DAILY. 270 tablet 0   mirabegron ER (MYRBETRIQ) 50 MG TB24 tablet Take 50 mg by mouth every evening.      Multiple Vitamin (MULTIVITAMIN) tablet Take 1 tablet by mouth daily.     Multiple Vitamins-Minerals (PRESERVISION AREDS PO) Take 1 capsule by mouth 2 (two) times daily.     polyethylene glycol (MIRALAX / GLYCOLAX) packet Take 17 g by mouth daily. 14 each 0   SPIRIVA HANDIHALER 18 MCG inhalation capsule PLACE 1 CAPSULE INTO INHALER AND INHALE DAILY 30 capsule 2   tamsulosin (FLOMAX) 0.4 MG CAPS capsule TAKE 2 CAPSULES BY MOUTH EVERY DAY 180 capsule 0   traMADol (ULTRAM) 50 MG tablet Take 1 tablet (50 mg total) by mouth every 8 (eight) hours as needed. 45 tablet 0   warfarin (COUMADIN) 2 MG tablet Take 1 tablet (2 mg total) by mouth daily at 6 PM. (Patient  taking differently: Take 2-3 mg by mouth See admin instructions. 2MG  ON ALL DAYS EXCEPT 3MG  ON WEDNESDAYS) 90 tablet 0   warfarin (COUMADIN) 3 MG tablet Take 1 tablet (3 mg total) by mouth daily. 90 tablet 1   No current facility-administered medications for this visit.     Physical Findings:  vitals were not taken for this visit.   /Unable to assess due to telephone follow-up visit format.  Lab Findings: Lab Results  Component Value Date   WBC 17.5 (H) 03/03/2019   HGB 15.6 03/03/2019   HCT 50.1 03/03/2019   MCV 89.8 03/03/2019   PLT 337 03/03/2019     Radiographic  Findings: Ct Chest W Contrast  Result Date: 09/07/2019 CLINICAL DATA:  Left upper lobe lung cancer status post SBRT completed November 2014. Puatative right lower lobe lung cancer status post SBRT completed 04/02/2017. Restaging. EXAM: CT CHEST WITH CONTRAST TECHNIQUE: Multidetector CT imaging of the chest was performed during intravenous contrast administration. CONTRAST:  63mL OMNIPAQUE IOHEXOL 300 MG/ML  SOLN COMPARISON:  02/25/2027 chest CT. FINDINGS: Scan limited by motion degradation. Cardiovascular: Mild cardiomegaly. No significant pericardial effusion/thickening. Three-vessel coronary atherosclerosis. Single lead left subclavian ICD is noted with lead tip in the right ventricular apex. Atherosclerotic nonaneurysmal thoracic aorta. Normal caliber pulmonary arteries. No central pulmonary emboli. Mediastinum/Nodes: No discrete thyroid nodules. Unremarkable esophagus. No pathologically enlarged axillary, mediastinal or hilar lymph nodes. Lungs/Pleura: No pneumothorax. No pleural effusion. Moderate centrilobular and paraseptal emphysema with mild diffuse bronchial wall thickening. There is a new 1.8 x 1.6 cm solid pulmonary nodule in the posterior right upper lobe (series 4/image 44) with indistinct margins. Mild sharply marginated posterior perihilar consolidation in the medial right lower lobe (series 4/image 74) with associated mild volume loss and distortion, compatible with mild radiation fibrosis. Sharply marginated bandlike consolidation in the perihilar left upper lobe with associated volume loss and distortion, unchanged, compatible with radiation fibrosis. Stable small chronic nodular focus of scarring in the lingula (series 4/image 78). No additional significant pulmonary nodules. Upper abdomen: No acute abnormality. Musculoskeletal: No aggressive appearing focal osseous lesions. Superficial subcutaneous cystic 2.2 cm lesion in midline anterior chest (series 2/image 101), not appreciably changed,  compatible with a benign lesion such as a sebaceous cyst. Similar cystic superficial subcutaneous midline back 1.8 cm lesion (series 2/image 21), not appreciably changed, compatible with a benign lesion such as a sebaceous cyst. Stable severe chronic T6 vertebral compression fracture. Stable moderate chronic T10 vertebral compression fracture. Moderate thoracic spondylosis. Healed deformity in the lower sternum. IMPRESSION: 1. Limited motion degraded scan. 2. New indistinct 1.8 cm solid posterior right upper lobe pulmonary nodule. Metastasis or metachronous primary bronchogenic carcinoma not excluded. Recommend either follow-up chest CT in 3 months or further evaluation with PET-CT. 3. Stable posttreatment changes in the perihilar left upper and right lower lobes with no evidence of local tumor recurrence in these locations. 4. No thoracic adenopathy or other potential findings of metastatic disease in the chest. Aortic Atherosclerosis (ICD10-I70.0) and Emphysema (ICD10-J43.9). Electronically Signed   By: Ilona Sorrel M.D.   On: 09/07/2019 13:13    Impression/Plan: 1. History of LUL NSCLC and putative NSCLC of the right lower lung - Clinical stage IA. The patient remains stable clinically, however, on recent CT chest imaging, there is a new, indistinct 1.8 cm solid posterior right upper lobe pulmonary nodule suspicious for metastasis or metachronous primary bronchogenic carcinoma. There is stable posttreatment changes in the perihilar left upper and right lower lobes with no  evidence of local tumor recurrence in these locations and no thoracic adenopathy or other potential findings of metastatic disease in the chest. The recommendation is to proceed with further evaluation of the new RUL nodule with PET scan to determine significance and potential need for further treatment.  They are in agreement with the recommendation and agree to proceed.  We will arrange for PET scan in the next 1-2 weeks, at [facility] as per patient request, and we will reconnect following that scan to discuss results and recommendations. They are comfortable with and in agreement with this plan and know to call at any time with any questions or concerns in the interim. I will forward a copy of my note today and recent scan results to Dr. Servando Snare for his records as well.    Given current concerns for patient exposure during the COVID-19 pandemic, this encounter was conducted via telephone. The patient and his wife, Travis Cruz, were notified in advance and offered a [place] meeting to allow for face to face communication but unfortunately reported that they did not have the appropriate resources/technology to support such a visit and instead preferred to proceed with telephone follow up. The patient/wife has given verbal consent for this type of encounter. The time spent during this encounter was 35 minutes. The attendants for this meeting include Travis Bartoletti PA-C, patient, Travis Cruz and his wife, Travis Cruz. During the encounter, Travis Diego PA-C, was located at [facility] Radiation Oncology Department.  Patient, Travis Cruz and his wife, Travis Cruz, were located at home.   Nicholos Johns, PA-C

## 2019-09-08 ENCOUNTER — Encounter: Payer: Self-pay | Admitting: Urology

## 2019-09-08 ENCOUNTER — Telehealth: Payer: Self-pay | Admitting: *Deleted

## 2019-09-08 ENCOUNTER — Ambulatory Visit
Admission: RE | Admit: 2019-09-08 | Discharge: 2019-09-08 | Disposition: A | Discharge status: Home / Self Care | Payer: BC Managed Care – PPO | Source: Ambulatory Visit | Attending: Urology | Admitting: Urology

## 2019-09-08 ENCOUNTER — Other Ambulatory Visit: Payer: Self-pay

## 2019-09-08 DIAGNOSIS — Z08 Encounter for follow-up examination after completed treatment for malignant neoplasm: Secondary | ICD-10-CM | POA: Diagnosis not present

## 2019-09-08 DIAGNOSIS — R911 Solitary pulmonary nodule: Secondary | ICD-10-CM | POA: Diagnosis not present

## 2019-09-08 DIAGNOSIS — C3431 Malignant neoplasm of lower lobe, right bronchus or lung: Secondary | ICD-10-CM

## 2019-09-08 DIAGNOSIS — Z86718 Personal history of other venous thrombosis and embolism: Secondary | ICD-10-CM

## 2019-09-08 DIAGNOSIS — F03918 Unspecified dementia, unspecified severity, with other behavioral disturbance: Secondary | ICD-10-CM | POA: Insufficient documentation

## 2019-09-08 DIAGNOSIS — Z86711 Personal history of pulmonary embolism: Secondary | ICD-10-CM

## 2019-09-08 DIAGNOSIS — I69911 Memory deficit following unspecified cerebrovascular disease: Secondary | ICD-10-CM | POA: Insufficient documentation

## 2019-09-08 DIAGNOSIS — F0391 Unspecified dementia with behavioral disturbance: Secondary | ICD-10-CM | POA: Insufficient documentation

## 2019-09-08 DIAGNOSIS — Z9981 Dependence on supplemental oxygen: Secondary | ICD-10-CM | POA: Diagnosis not present

## 2019-09-08 DIAGNOSIS — C3412 Malignant neoplasm of upper lobe, left bronchus or lung: Secondary | ICD-10-CM | POA: Diagnosis not present

## 2019-09-08 NOTE — Telephone Encounter (Signed)
Patient notified and verbalized understanding. 

## 2019-09-08 NOTE — Telephone Encounter (Signed)
Fax received [facility]INR PT/INR self testing service Test date/time 09/07/19 5:50 pm INR 3.0

## 2019-09-08 NOTE — Telephone Encounter (Signed)
Description   Continue on 3 mg every day  INR 3.0!  At home (goal is 2-3) at goal today Recheck in 1 to 2 weeks       Caryl Pina, MD [place] 09/08/2019, 9:25 AM

## 2019-09-13 ENCOUNTER — Other Ambulatory Visit: Payer: Self-pay | Admitting: Family Medicine

## 2019-09-13 DIAGNOSIS — N400 Enlarged prostate without lower urinary tract symptoms: Secondary | ICD-10-CM

## 2019-09-13 DIAGNOSIS — R609 Edema, unspecified: Secondary | ICD-10-CM

## 2019-09-15 ENCOUNTER — Telehealth: Payer: Self-pay | Admitting: *Deleted

## 2019-09-15 DIAGNOSIS — Z86711 Personal history of pulmonary embolism: Secondary | ICD-10-CM

## 2019-09-15 DIAGNOSIS — Z86718 Personal history of other venous thrombosis and embolism: Secondary | ICD-10-CM

## 2019-09-15 NOTE — Telephone Encounter (Signed)
Fax received [facility]INR PT/INR self testing service Test date/time 09/14/19 6:00 pm INR 3.2

## 2019-09-15 NOTE — Telephone Encounter (Signed)
Description   Hold today and then continue on 3 mg every day  INR 3.2!  At home (goal is 2-3) at goal today Recheck in 1 to 2 weeks       Caryl Pina, MD [place] 09/15/2019, 1:22 PM

## 2019-09-15 NOTE — Telephone Encounter (Signed)
Called patient to inform of PET Scan for 09-26-19 - arrival time- 6:30 pm @ [facility], patient to be NPO-6 hrs. prior to test, patient to have water only, patient's last meal prior to Pet should be low carb, spoke with patient's wife - [name] and she is aware of this test

## 2019-09-15 NOTE — Telephone Encounter (Signed)
Patients wife aware

## 2019-09-21 ENCOUNTER — Ambulatory Visit (INDEPENDENT_AMBULATORY_CARE_PROVIDER_SITE_OTHER): Payer: BC Managed Care – PPO | Admitting: Urology

## 2019-09-21 DIAGNOSIS — N401 Enlarged prostate with lower urinary tract symptoms: Secondary | ICD-10-CM

## 2019-09-21 DIAGNOSIS — R351 Nocturia: Secondary | ICD-10-CM

## 2019-09-21 DIAGNOSIS — R339 Retention of urine, unspecified: Secondary | ICD-10-CM

## 2019-09-22 ENCOUNTER — Telehealth: Payer: Self-pay | Admitting: Family Medicine

## 2019-09-22 DIAGNOSIS — Z86711 Personal history of pulmonary embolism: Secondary | ICD-10-CM

## 2019-09-22 DIAGNOSIS — Z86718 Personal history of other venous thrombosis and embolism: Secondary | ICD-10-CM

## 2019-09-22 NOTE — Telephone Encounter (Signed)
Fax received [facility]INR PT/INR self testing service Test date/time 09/21/2019 6:09pm INR 2.3  Please route to pools.

## 2019-09-22 NOTE — Telephone Encounter (Signed)
Description    continue on 3 mg every day  INR 2.3!  At home (goal is 2-3) at goal today Recheck in 1 to 2 weeks       Caryl Pina, MD [place] 09/22/2019, 9:22 AM

## 2019-09-22 NOTE — Telephone Encounter (Signed)
Wife aware of results and verbalizes understanding per dpr.

## 2019-09-26 ENCOUNTER — Other Ambulatory Visit: Payer: Self-pay

## 2019-09-26 ENCOUNTER — Encounter (HOSPITAL_COMMUNITY)
Admission: RE | Admit: 2019-09-26 | Discharge: 2019-09-26 | Disposition: A | Discharge status: Home / Self Care | Payer: BC Managed Care – PPO | Source: Ambulatory Visit | Attending: Urology | Admitting: Urology

## 2019-09-26 DIAGNOSIS — R911 Solitary pulmonary nodule: Secondary | ICD-10-CM | POA: Diagnosis not present

## 2019-09-27 DIAGNOSIS — C3412 Malignant neoplasm of upper lobe, left bronchus or lung: Secondary | ICD-10-CM | POA: Diagnosis not present

## 2019-09-27 MED ORDER — FLUDEOXYGLUCOSE F - 18 (FDG) INJECTION
13.0000 | Freq: Once | INTRAVENOUS | Status: AC | PRN
  Administered 2019-09-26: 13 via INTRAVENOUS

## 2019-09-28 DIAGNOSIS — Z86718 Personal history of other venous thrombosis and embolism: Secondary | ICD-10-CM | POA: Diagnosis not present

## 2019-09-28 DIAGNOSIS — Z7901 Long term (current) use of anticoagulants: Secondary | ICD-10-CM | POA: Diagnosis not present

## 2019-09-29 ENCOUNTER — Other Ambulatory Visit: Payer: Self-pay | Admitting: *Deleted

## 2019-09-29 ENCOUNTER — Ambulatory Visit
Admission: RE | Admit: 2019-09-29 | Discharge: 2019-09-29 | Disposition: A | Discharge status: Home / Self Care | Payer: BC Managed Care – PPO | Source: Ambulatory Visit | Attending: Urology | Admitting: Urology

## 2019-09-29 DIAGNOSIS — R911 Solitary pulmonary nodule: Secondary | ICD-10-CM | POA: Diagnosis not present

## 2019-09-29 DIAGNOSIS — Z923 Personal history of irradiation: Secondary | ICD-10-CM | POA: Diagnosis not present

## 2019-09-29 DIAGNOSIS — Z85118 Personal history of other malignant neoplasm of bronchus and lung: Secondary | ICD-10-CM | POA: Diagnosis not present

## 2019-09-29 DIAGNOSIS — F0390 Unspecified dementia without behavioral disturbance: Secondary | ICD-10-CM | POA: Diagnosis not present

## 2019-09-29 NOTE — Progress Notes (Signed)
The proposed treatment discussed in cancer conference 09/29/19 is for discussion purpose only and is not a binding recommendation.  The patient was not physically examined nor present for their treatment options.  Therefore, final treatment plans cannot be decided.

## 2019-09-29 NOTE — Progress Notes (Signed)
Radiation Oncology         [phone] ________________________________  Name: Travis Cruz MRN: [id number]  Date: 09/29/2019  DOB: 09/17/1946  Follow up New outpatient Visit  CC: Dettinger, Fransisca Kaufmann, MD  Grace Isaac, MD  Diagnosis:  New, putative metachronous stage IA NSCLC of the right upper lobe lung in patient with a history of left upper lobe NSCLC, squamous cell carcinoma, and putative NSCLC of the right lower lung - Clinical stage IA     Interval Since Last Radiation:  2 years and 6 months s/p SBRT to the RLL 03/23/2017 to 04/02/2017:  The target was treated to 50 Gy in 5 fractions of 10 Gy    11/07/13-11/16/13:  Left upper lobe / 60 Gy in 5 fractions at 12 Gray per fraction  Narrative:  I spoke with the patient to conduct his follow up new visit to review results from recent PET scan performed on 09/26/19 via telephone to spare the patient unnecessary potential exposure in the healthcare setting during the current COVID-19 pandemic.  The patient was notified in advance and gave permission to proceed with this visit format.  He has remained clinically stable but recent follow up CT Imaging from 09/07/19 revealed a new, indistinct 1.8 cm solid posterior right upper lobe pulmonary nodule suspicious for metastasis or metachronous primary bronchogenic carcinoma. There was no thoracic adenopathy or other potential findings of metastatic disease in the chest. This was discussed with the patient and his wife at the time of his 6 month follow up visit and the decision was to proceed with further evaluation of the new nodule with PET scan.  PET scan was completed on 09/26/19 and demonstrated hypermetabolic activity in the posterior right upper lobe 1.8 cm pulmonary nodule compatible with pulmonary malignancy, either a pulmonary metastasis or metachronous primary bronchogenic carcinoma. Fortunately, there was no hypermetabolic thoracic adenopathy or distant metastatic disease and no  hypermetabolic recurrence at the sites of previous radiation therapy in the perihilar left upper and right lower lung lobes.   On review of systems, obtained from wife, Travis Cruz, due to patient's dementia, the patient states that he is doing well and is currently without complaints.  He specifically denies dysphagia, chest pain, productive cough, hemoptysis, fever, chills, N/V.  He reports that his appetite is good, he is maintaining his weight and his energy level is stable.  He continues on 3 L of oxygen via nasal cannula 24/7.  He denies recent unintended weight loss or night sweats.  ALLERGIES:  is allergic to ativan [lorazepam].  Meds: Current Outpatient Medications  Medication Sig Dispense Refill   atorvastatin (LIPITOR) 80 MG tablet TAKE 1 TABLET BY MOUTH THREE TIMES PER WEEK 36 tablet 1   Cholecalciferol (VITAMIN D3) 2000 units TABS Take 1 tablet by mouth every morning.      finasteride (PROSCAR) 5 MG tablet Take 1 tablet (5 mg total) by mouth daily. 90 tablet 3   furosemide (LASIX) 20 MG tablet TAKE 2 TABLETS BY MOUTH EVERY DAY 180 tablet 0   metoprolol tartrate (LOPRESSOR) 25 MG tablet TAKE 1.5 TABLETS (37.5 MG TOTAL) BY MOUTH 2 (TWO) TIMES DAILY. 270 tablet 0   mirabegron ER (MYRBETRIQ) 50 MG TB24 tablet Take 50 mg by mouth every evening.      Multiple Vitamin (MULTIVITAMIN) tablet Take 1 tablet by mouth daily.     Multiple Vitamins-Minerals (PRESERVISION AREDS PO) Take 1 capsule by mouth 2 (two) times daily.     polyethylene glycol (MIRALAX /  GLYCOLAX) packet Take 17 g by mouth daily. 14 each 0   SPIRIVA HANDIHALER 18 MCG inhalation capsule PLACE 1 CAPSULE INTO INHALER AND INHALE DAILY 30 capsule 2   tamsulosin (FLOMAX) 0.4 MG CAPS capsule TAKE 2 CAPSULES BY MOUTH EVERY DAY 180 capsule 0   traMADol (ULTRAM) 50 MG tablet Take 1 tablet (50 mg total) by mouth every 8 (eight) hours as needed. (Patient not taking: Reported on 09/08/2019) 45 tablet 0   warfarin (COUMADIN) 2 MG  tablet Take 1 tablet (2 mg total) by mouth daily at 6 PM. (Patient not taking: Reported on 09/08/2019) 90 tablet 0   warfarin (COUMADIN) 3 MG tablet Take 1 tablet (3 mg total) by mouth daily. 90 tablet 1   No current facility-administered medications for this encounter.     Physical Findings:  vitals were not taken for this visit.   /Unable to assess due to telephone follow-up visit format.  Lab Findings: Lab Results  Component Value Date   WBC 17.5 (H) 03/03/2019   HGB 15.6 03/03/2019   HCT 50.1 03/03/2019   MCV 89.8 03/03/2019   PLT 337 03/03/2019     Radiographic Findings: Ct Chest W Contrast  Result Date: 09/07/2019 CLINICAL DATA:  Left upper lobe lung cancer status post SBRT completed November 2014. Puatative right lower lobe lung cancer status post SBRT completed 04/02/2017. Restaging. EXAM: CT CHEST WITH CONTRAST TECHNIQUE: Multidetector CT imaging of the chest was performed during intravenous contrast administration. CONTRAST:  59mL OMNIPAQUE IOHEXOL 300 MG/ML  SOLN COMPARISON:  02/25/2027 chest CT. FINDINGS: Scan limited by motion degradation. Cardiovascular: Mild cardiomegaly. No significant pericardial effusion/thickening. Three-vessel coronary atherosclerosis. Single lead left subclavian ICD is noted with lead tip in the right ventricular apex. Atherosclerotic nonaneurysmal thoracic aorta. Normal caliber pulmonary arteries. No central pulmonary emboli. Mediastinum/Nodes: No discrete thyroid nodules. Unremarkable esophagus. No pathologically enlarged axillary, mediastinal or hilar lymph nodes. Lungs/Pleura: No pneumothorax. No pleural effusion. Moderate centrilobular and paraseptal emphysema with mild diffuse bronchial wall thickening. There is a new 1.8 x 1.6 cm solid pulmonary nodule in the posterior right upper lobe (series 4/image 44) with indistinct margins. Mild sharply marginated posterior perihilar consolidation in the medial right lower lobe (series 4/image 74) with  associated mild volume loss and distortion, compatible with mild radiation fibrosis. Sharply marginated bandlike consolidation in the perihilar left upper lobe with associated volume loss and distortion, unchanged, compatible with radiation fibrosis. Stable small chronic nodular focus of scarring in the lingula (series 4/image 78). No additional significant pulmonary nodules. Upper abdomen: No acute abnormality. Musculoskeletal: No aggressive appearing focal osseous lesions. Superficial subcutaneous cystic 2.2 cm lesion in midline anterior chest (series 2/image 101), not appreciably changed, compatible with a benign lesion such as a sebaceous cyst. Similar cystic superficial subcutaneous midline back 1.8 cm lesion (series 2/image 21), not appreciably changed, compatible with a benign lesion such as a sebaceous cyst. Stable severe chronic T6 vertebral compression fracture. Stable moderate chronic T10 vertebral compression fracture. Moderate thoracic spondylosis. Healed deformity in the lower sternum. IMPRESSION: 1. Limited motion degraded scan. 2. New indistinct 1.8 cm solid posterior right upper lobe pulmonary nodule. Metastasis or metachronous primary bronchogenic carcinoma not excluded. Recommend either follow-up chest CT in 3 months or further evaluation with PET-CT. 3. Stable posttreatment changes in the perihilar left upper and right lower lobes with no evidence of local tumor recurrence in these locations. 4. No thoracic adenopathy or other potential findings of metastatic disease in the chest. Aortic Atherosclerosis (ICD10-I70.0) and Emphysema (  ICD10-J43.9). Electronically Signed   By: Ilona Sorrel M.D.   On: 09/07/2019 13:13   Nm Pet Image Restag (ps) Skull Base To Thigh  Result Date: 09/27/2019 CLINICAL DATA:  Subsequent treatment strategy for left upper lobe lung cancer status post radiation therapy completed November 2014, putative right lower lobe lung cancer status post radiation therapy completed  04/02/2017, with new right upper lobe nodule on recent restaging chest CT. EXAM: NUCLEAR MEDICINE PET SKULL BASE TO THIGH TECHNIQUE: 13.0 mCi F-18 FDG was injected intravenously. Full-ring PET imaging was performed from the skull base to thigh after the radiotracer. CT data was obtained and used for attenuation correction and anatomic localization. Fasting blood glucose: 103 mg/dl COMPARISON:  02/10/2017 PET-CT.  09/07/2019 chest CT. FINDINGS: Mediastinal blood pool activity: SUV max 3.0 Liver activity: SUV max NA NECK: No hypermetabolic lymph nodes in the neck. Incidental CT findings: none CHEST: Posterior right upper lobe 1.8 cm pulmonary nodule is hypermetabolic with max SUV 4.3, unchanged in size since the recent 09/07/2019 chest CT, new since 02/25/2019 chest CT. No additional hypermetabolic pulmonary findings. Specifically, no recurrent hypermetabolism at the sites of previous radiation therapy in the perihilar left upper and right lower lung lobes. No enlarged or hypermetabolic axillary, mediastinal or hilar lymph nodes. Incidental CT findings: Coronary atherosclerosis. Single lead left subclavian ICD noted with lead tips in the right ventricular apex. Atherosclerotic nonaneurysmal thoracic aorta. Top-normal heart size. Moderate centrilobular and paraseptal emphysema. Simple 1.8 cm superficial subcutaneous cystic lesion in ventral midline chest at the level of the xiphoid process (series 3/image 130), probably a sebaceous cyst. ABDOMEN/PELVIS: No abnormal hypermetabolic activity within the liver, pancreas, adrenal glands, or spleen. No hypermetabolic lymph nodes in the abdomen or pelvis. Incidental CT findings: Cholelithiasis. Splenectomy. Atherosclerotic nonaneurysmal abdominal aorta. Moderate diffuse colonic diverticulosis. SKELETON: No focal hypermetabolic activity to suggest skeletal metastasis. There is hypermetabolism surrounding the partially visualized surgical hardware in distal left femur, with  associated healed deformity, presumably postsurgical/inflammatory. There is mild hypermetabolism (max SUV 3.8) at the site of the chronic T6 severe vertebral compression fracture, without discrete osseous lesion. Incidental CT findings: none IMPRESSION: 1. Posterior right upper lobe 1.8 cm pulmonary nodule is hypermetabolic (max SUV 4.3), new since 02/25/2019 chest CT, compatible with pulmonary malignancy, either a pulmonary metastasis or metachronous primary bronchogenic carcinoma. 2. No hypermetabolic thoracic adenopathy or distant metastatic disease. 3. No hypermetabolic recurrence at the sites of previous radiation therapy in the perihilar left upper and right lower lung lobes. 4. Hypermetabolism surrounding the partially visualized surgical hardware in the distal left femur, presumably postsurgical/inflammatory, correlate with clinical exam. No discrete osseous lesions. 5. Chronic findings include: Aortic Atherosclerosis (ICD10-I70.0) and Emphysema (ICD10-J43.9). Cholelithiasis. Moderate diffuse colonic diverticulosis. Electronically Signed   By: Ilona Sorrel M.D.   On: 09/27/2019 10:57    Impression/Plan: 1. 73 y/o male with new, putative stage IA NSCLC in the right upper lobe lung. Today, I talked to the patient and his wife, Travis Cruz about the findings and workup thus far. We reviewed the recent PET results and discussed recommendations from [facility] where his case and imaging were presented and reviewed this morning.  Consensus recommendation is to proceed with definitive SBRT to the new RUL lesion, without tissue confirmation due to his high risk associated with biopsy in light of his progressive, underlying COPD. The lesion is hypermetabolic on PET and convincing that this is most likely a metachronous, early stage NSCLC. We discussed the natural history of non small cell lung carcinoma and  general treatment, highlighting the role of curative/definitive stereotactic body radiotherapy (SBRT) in the  management. We discussed the available radiation techniques, and focused on the details of logistics and delivery as it pertains to SBRT.  Dr. Tammi Klippel recommends a 5 fraction course of SBRT to the RUL lesion and feels that there is not a need for surgical repositioning of his implanted pacemaker due to the position of the lesion.  We reviewed the anticipated acute and late sequelae associated with radiation in this setting which he is quite familiar with, having had 2 prior courses of SBRT which he has tolerated very well. The patient and his wife were encouraged to ask questions that were answered to their stated satisfaction. They have provided verbal consent to proceed and he will be scheduled for CT SIM/treatment planning in the very near future.  They understand that they will need to sign written consent for documentation at the time of his Simulation. His wife will need to accompany him for his CT [facility] visit due to his dementia and inability to provide accurate history or retain information/instruction. Special permission has been requested and obtained.  They are comfortable with and in agreement with this plan and know to call at any time with any questions or concerns in the interim. I will forward a copy of my note today and recent scan results to Dr. Servando Snare for his records as well.    Given current concerns for patient exposure during the COVID-19 pandemic, this encounter was conducted via telephone. The patient and his wife, Travis Cruz, were notified in advance and offered a [place] meeting to allow for face to face communication but unfortunately reported that they did not have the appropriate resources/technology to support such a visit and instead preferred to proceed with telephone follow up. The patient/wife has given verbal consent for this type of encounter. The time spent during this encounter was 35 minutes. The attendants for this meeting include Mida Cory PA-C, patient, Travis Cruz and  his wife, Travis Cruz. During the encounter, Gleason Ardoin PA-C, was located at [facility] Radiation Oncology Department.  Patient, Travis Cruz and his wife, Travis Cruz, were located at home.   Nicholos Johns, PA-C    Tyler Pita, MD  [facility] Direct Dial: [phone]   Fax: [phone] [email or web address]   Skype   LinkedIn

## 2019-10-01 DIAGNOSIS — E785 Hyperlipidemia, unspecified: Secondary | ICD-10-CM | POA: Diagnosis not present

## 2019-10-01 DIAGNOSIS — J449 Chronic obstructive pulmonary disease, unspecified: Secondary | ICD-10-CM | POA: Diagnosis not present

## 2019-10-03 ENCOUNTER — Encounter: Payer: Self-pay | Admitting: Family Medicine

## 2019-10-03 ENCOUNTER — Ambulatory Visit (INDEPENDENT_AMBULATORY_CARE_PROVIDER_SITE_OTHER): Payer: BC Managed Care – PPO | Admitting: *Deleted

## 2019-10-03 ENCOUNTER — Telehealth: Payer: Self-pay | Admitting: Radiation Oncology

## 2019-10-03 DIAGNOSIS — I639 Cerebral infarction, unspecified: Secondary | ICD-10-CM

## 2019-10-03 NOTE — Telephone Encounter (Signed)
Faxed PACEMAKER/ICD form to [facility] to be completed by Dr. Virl Axe. Fax confirmation of delivery obtained. Awaiting return fax.

## 2019-10-03 NOTE — Telephone Encounter (Signed)
-----   Message from [name], [place] sent at 09/30/2019  1:58 PM EDT ----- Regarding: Pacemaker form Hey doll! We are getting Travis Cruz teed up for another course of SBRT.  He has previously been treated in 2014 and 2018.  He has a pacer in his right chest (was moved from left to right in 2014 prior to treating the LUL lesion).  I was not sure if we needed to send Dr. Caryl Comes a pacemaker form to be completed again, as I know this has been done in the past. We are getting ready to treat a RUL lesion but Tammi Klippel did not think there was any reason that he would need the pacer moved again. Let me know! Thank you!!! -Ashlyn

## 2019-10-04 ENCOUNTER — Other Ambulatory Visit: Payer: Self-pay

## 2019-10-04 ENCOUNTER — Ambulatory Visit
Admission: RE | Admit: 2019-10-04 | Discharge: 2019-10-04 | Disposition: A | Discharge status: Home / Self Care | Payer: BC Managed Care – PPO | Source: Ambulatory Visit | Attending: Radiation Oncology | Admitting: Radiation Oncology

## 2019-10-04 DIAGNOSIS — C3411 Malignant neoplasm of upper lobe, right bronchus or lung: Secondary | ICD-10-CM | POA: Diagnosis not present

## 2019-10-04 DIAGNOSIS — Z510 Encounter for antineoplastic radiation therapy: Secondary | ICD-10-CM | POA: Insufficient documentation

## 2019-10-04 LAB — CUP PACEART REMOTE DEVICE CHECK
Battery Remaining Longevity: 44 mo
Battery Remaining Percentage: 43 %
Battery Voltage: 2.9 V
Brady Statistic RV Percent Paced: 1 %
Date Time Interrogation Session: 20201005080017
HighPow Impedance: 86 Ohm
HighPow Impedance: 86 Ohm
Implantable Lead Implant Date: 20140205
Implantable Lead Location: 753860
Implantable Lead Model: 181
Implantable Lead Serial Number: 323859
Implantable Pulse Generator Implant Date: 20140205
Lead Channel Impedance Value: 450 Ohm
Lead Channel Pacing Threshold Amplitude: 0.75 V
Lead Channel Pacing Threshold Pulse Width: 0.5 ms
Lead Channel Sensing Intrinsic Amplitude: 11.8 mV
Lead Channel Setting Pacing Amplitude: 2.5 V
Lead Channel Setting Pacing Pulse Width: 0.5 ms
Lead Channel Setting Sensing Sensitivity: 0.5 mV
Pulse Gen Serial Number: 7040910

## 2019-10-04 NOTE — Progress Notes (Signed)
  Radiation Oncology         [phone] ________________________________  Name: Travis Cruz MRN: [id number]  Date: 10/04/2019  DOB: 09-17-1946  STEREOTACTIC BODY RADIOTHERAPY SIMULATION AND TREATMENT PLANNING NOTE    ICD-10-CM   1. Primary cancer of right upper lobe of lung (HCC)  C34.11     DIAGNOSIS:  73 yo man with new clinical stage IA2 non-small cell carcinoma of the right upper lung.  NARRATIVE:  The patient was brought to the [facility].  Identity was confirmed.  All relevant records and images related to the planned course of therapy were reviewed.  The patient freely provided informed written consent to proceed with treatment after reviewing the details related to the planned course of therapy. The consent form was witnessed and verified by the simulation staff.  Then, the patient was set-up in a stable reproducible  supine position for radiation therapy.  A BodyFix immobilization pillow was fabricated for reproducible positioning.  Then I personally applied the abdominal compression paddle to limit respiratory excursion.  4D respiratoy motion management CT images were obtained.  Surface markings were placed.  The CT images were loaded into the planning software.  Then, using Cine, MIP, and standard views, the internal target volume (ITV) and planning target volumes (PTV) were delinieated, and avoidance structures were contoured.  Treatment planning then occurred.  The radiation prescription was entered and confirmed.  A total of two complex treatment devices were fabricated in the form of the BodyFix immobilization pillow and a neck accuform cushion.  I have requested : 3D Simulation  I have requested a DVH of the following structures: Heart, Lungs, Esophagus, Chest Wall, Brachial Plexus, Major Blood Vessels, and targets.  SPECIAL TREATMENT PROCEDURE:  The planned course of therapy using radiation constitutes a special treatment procedure. Special care is required  in the management of this patient for the following reasons. This treatment constitutes a Special Treatment Procedure for the following reason: [ High dose per fraction requiring special monitoring for increased toxicities of treatment including daily imaging..  The special nature of the planned course of radiotherapy will require increased physician supervision and oversight to ensure patient's safety with optimal treatment outcomes.  RESPIRATORY MOTION MANAGEMENT SIMULATION:  In order to account for effect of respiratory motion on target structures and other organs in the planning and delivery of radiotherapy, this patient underwent respiratory motion management simulation.  To accomplish this, when the patient was brought to the CT simulation planning suite, 4D respiratoy motion management CT images were obtained.  The CT images were loaded into the planning software.  Then, using a variety of tools including Cine, MIP, and standard views, the target volume and planning target volumes (PTV) were delineated.  Avoidance structures were contoured.  Treatment planning then occurred.  Dose volume histograms were generated and reviewed for each of the requested structure.  The resulting plan was carefully reviewed and approved today.  PLAN:  The patient will receive 54 Gy in 3 fraction.  ________________________________  Sheral Apley Tammi Klippel, M.D.

## 2019-10-05 DIAGNOSIS — J449 Chronic obstructive pulmonary disease, unspecified: Secondary | ICD-10-CM | POA: Diagnosis not present

## 2019-10-06 ENCOUNTER — Telehealth: Payer: Self-pay | Admitting: *Deleted

## 2019-10-06 ENCOUNTER — Telehealth: Payer: Self-pay | Admitting: Radiation Oncology

## 2019-10-06 DIAGNOSIS — Z510 Encounter for antineoplastic radiation therapy: Secondary | ICD-10-CM | POA: Diagnosis not present

## 2019-10-06 DIAGNOSIS — Z86718 Personal history of other venous thrombosis and embolism: Secondary | ICD-10-CM

## 2019-10-06 DIAGNOSIS — C3411 Malignant neoplasm of upper lobe, right bronchus or lung: Secondary | ICD-10-CM | POA: Diagnosis not present

## 2019-10-06 DIAGNOSIS — Z86711 Personal history of pulmonary embolism: Secondary | ICD-10-CM

## 2019-10-06 NOTE — Telephone Encounter (Signed)
Fax received mdINR PT/INR self testing service Test date/time 10/05/19 5:45 pm INR 2.7

## 2019-10-06 NOTE — Telephone Encounter (Signed)
Wife aware per dpr.  

## 2019-10-06 NOTE — Telephone Encounter (Signed)
Phoned [facility] to inquire about status of PACEMAKER/ICD form. Spoke with Maudie Mercury. Maudie Mercury confirms receipt of the form. She confirms the form is complete and waiting on Dr. Olin Pia signature. She reports Dr. Caryl Comes has been out of the clinic x 2 days. Explained the patient is scheduled to start XRT on Monday and the form is needed asap. Kim verbalized understanding. Informed L1 of these findings.

## 2019-10-06 NOTE — Telephone Encounter (Signed)
Description    continue on 3 mg every day  INR 2.7!  At home (goal is 2-3) at goal today Recheck in 1 to 2 weeks       Caryl Pina, MD [place] 10/06/2019, 10:53 AM

## 2019-10-10 ENCOUNTER — Ambulatory Visit: Payer: BC Managed Care – PPO

## 2019-10-10 ENCOUNTER — Ambulatory Visit
Admission: RE | Admit: 2019-10-10 | Discharge: 2019-10-10 | Disposition: A | Discharge status: Home / Self Care | Payer: BC Managed Care – PPO | Source: Ambulatory Visit | Attending: Radiation Oncology | Admitting: Radiation Oncology

## 2019-10-10 ENCOUNTER — Other Ambulatory Visit: Payer: Self-pay

## 2019-10-10 DIAGNOSIS — Z510 Encounter for antineoplastic radiation therapy: Secondary | ICD-10-CM | POA: Diagnosis not present

## 2019-10-10 DIAGNOSIS — C3411 Malignant neoplasm of upper lobe, right bronchus or lung: Secondary | ICD-10-CM | POA: Diagnosis not present

## 2019-10-11 ENCOUNTER — Ambulatory Visit: Payer: BC Managed Care – PPO

## 2019-10-12 ENCOUNTER — Ambulatory Visit: Payer: BC Managed Care – PPO

## 2019-10-12 ENCOUNTER — Encounter: Payer: Self-pay | Admitting: Internal Medicine

## 2019-10-12 ENCOUNTER — Encounter: Payer: Self-pay | Admitting: Family Medicine

## 2019-10-12 ENCOUNTER — Ambulatory Visit (INDEPENDENT_AMBULATORY_CARE_PROVIDER_SITE_OTHER): Payer: BC Managed Care – PPO | Admitting: Internal Medicine

## 2019-10-12 ENCOUNTER — Other Ambulatory Visit: Payer: Self-pay

## 2019-10-12 VITALS — BP 126/70 | HR 95 | Ht 69.0 in | Wt 225.0 lb

## 2019-10-12 DIAGNOSIS — I10 Essential (primary) hypertension: Secondary | ICD-10-CM | POA: Diagnosis not present

## 2019-10-12 DIAGNOSIS — I472 Ventricular tachycardia, unspecified: Secondary | ICD-10-CM

## 2019-10-12 DIAGNOSIS — I255 Ischemic cardiomyopathy: Secondary | ICD-10-CM | POA: Diagnosis not present

## 2019-10-12 DIAGNOSIS — Z95810 Presence of automatic (implantable) cardiac defibrillator: Secondary | ICD-10-CM

## 2019-10-12 LAB — CUP PACEART INCLINIC DEVICE CHECK
Battery Remaining Longevity: 44 mo
Brady Statistic RV Percent Paced: 0 %
Date Time Interrogation Session: 20201014153145
HighPow Impedance: 90 Ohm
Implantable Lead Implant Date: 20140205
Implantable Lead Location: 753860
Implantable Lead Model: 181
Implantable Lead Serial Number: 323859
Implantable Pulse Generator Implant Date: 20140205
Lead Channel Impedance Value: 450 Ohm
Lead Channel Pacing Threshold Amplitude: 0.75 V
Lead Channel Pacing Threshold Pulse Width: 0.5 ms
Lead Channel Sensing Intrinsic Amplitude: 11.8 mV
Lead Channel Setting Pacing Amplitude: 2.5 V
Lead Channel Setting Pacing Pulse Width: 0.5 ms
Lead Channel Setting Sensing Sensitivity: 0.5 mV
Pulse Gen Serial Number: 7040910

## 2019-10-12 NOTE — Patient Instructions (Signed)
Medication Instructions:  Your physician recommends that you continue on your current medications as directed. Please refer to the Current Medication list given to you today.  Labwork: None ordered.  Testing/Procedures: None ordered.  Follow-Up: Your physician wants you to follow-up in: one year with Dr. Caryl Comes.   You will receive a reminder letter in the mail two months in advance. If you don't receive a letter, please call our office to schedule the follow-up appointment.  Remote monitoring is used to monitor your ICD from home. This monitoring reduces the number of office visits required to check your device to one time per year. It allows [place] to keep an eye on the functioning of your device to ensure it is working properly. You are scheduled for a device check from home on 01/02/2020. You may send your transmission at any time that day. If you have a wireless device, the transmission will be sent automatically. After your physician reviews your transmission, you will receive a postcard with your next transmission date.  Any Other Special Instructions Will Be Listed Below (If Applicable).  If you need a refill on your cardiac medications before your next appointment, please call your pharmacy.

## 2019-10-12 NOTE — Addendum Note (Signed)
Addended by: Deboraha Sprang on: 10/12/2019 02:25 PM   Modules accepted: Level of Service

## 2019-10-12 NOTE — Progress Notes (Signed)
Remote ICD transmission.   
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
## 2019-10-13 ENCOUNTER — Ambulatory Visit: Payer: BC Managed Care – PPO

## 2019-10-13 ENCOUNTER — Telehealth: Payer: Self-pay | Admitting: Radiation Oncology

## 2019-10-13 ENCOUNTER — Ambulatory Visit (INDEPENDENT_AMBULATORY_CARE_PROVIDER_SITE_OTHER): Payer: BC Managed Care – PPO | Admitting: Family Medicine

## 2019-10-13 ENCOUNTER — Telehealth: Payer: Self-pay | Admitting: *Deleted

## 2019-10-13 ENCOUNTER — Encounter: Payer: Self-pay | Admitting: Family Medicine

## 2019-10-13 DIAGNOSIS — Z86718 Personal history of other venous thrombosis and embolism: Secondary | ICD-10-CM

## 2019-10-13 DIAGNOSIS — B029 Zoster without complications: Secondary | ICD-10-CM | POA: Diagnosis not present

## 2019-10-13 DIAGNOSIS — Z86711 Personal history of pulmonary embolism: Secondary | ICD-10-CM

## 2019-10-13 MED ORDER — VALACYCLOVIR HCL 1 G PO TABS: 1000.0000 mg | ORAL_TABLET | Freq: Three times a day (TID) | ORAL | 0 refills | Status: AC

## 2019-10-13 NOTE — Telephone Encounter (Signed)
Description    continue on 3 mg every day  INR 2.3!  At home (goal is 2-3) at goal today Recheck in 1 to 2 weeks        Caryl Pina, MD [place] 10/13/2019, 5:05 PM

## 2019-10-13 NOTE — Telephone Encounter (Signed)
Fax received mdINR PT/INR self testing service Test date/time 10/12/19 6:19 am INR 2.3
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
## 2019-10-13 NOTE — Telephone Encounter (Signed)
Received voicemail message from patient's wife, [name]. Phoned her back to inquire. She reports her husband was diagnosed with shingles today and prescribed Valtrex. She confirms he has taken already the first pill of his Valtrex script. She reports his shingles are on his legs and buttocks. Explained that per [name], PA-C the patient should come for his radiation treatment as scheduled since the shingles are not on his chest. [name] verbalized understanding and expressed appreciation for the return call. Informed Micheline Chapman, RT on L1 of this findings and sent a follow up email as a reminder.

## 2019-10-14 ENCOUNTER — Other Ambulatory Visit: Payer: Self-pay

## 2019-10-14 ENCOUNTER — Ambulatory Visit: Payer: Medicare Other

## 2019-10-14 ENCOUNTER — Ambulatory Visit: Payer: BC Managed Care – PPO

## 2019-10-14 ENCOUNTER — Ambulatory Visit
Admission: RE | Admit: 2019-10-14 | Discharge: 2019-10-14 | Disposition: A | Discharge status: Home / Self Care | Payer: BC Managed Care – PPO | Source: Ambulatory Visit | Attending: Radiation Oncology | Admitting: Radiation Oncology

## 2019-10-14 DIAGNOSIS — Z510 Encounter for antineoplastic radiation therapy: Secondary | ICD-10-CM | POA: Diagnosis not present

## 2019-10-14 DIAGNOSIS — C3411 Malignant neoplasm of upper lobe, right bronchus or lung: Secondary | ICD-10-CM | POA: Diagnosis not present

## 2019-10-14 NOTE — Telephone Encounter (Signed)
Patient's wife notified. MPulliam, CMA/RT(R)  

## 2019-10-17 ENCOUNTER — Ambulatory Visit
Admission: RE | Admit: 2019-10-17 | Discharge: 2019-10-17 | Disposition: A | Discharge status: Home / Self Care | Payer: BC Managed Care – PPO | Source: Ambulatory Visit | Attending: Radiation Oncology | Admitting: Radiation Oncology

## 2019-10-17 ENCOUNTER — Other Ambulatory Visit: Payer: Self-pay

## 2019-10-17 ENCOUNTER — Encounter: Payer: Self-pay | Admitting: Radiation Oncology

## 2019-10-17 DIAGNOSIS — C3411 Malignant neoplasm of upper lobe, right bronchus or lung: Secondary | ICD-10-CM | POA: Diagnosis not present

## 2019-10-17 DIAGNOSIS — Z510 Encounter for antineoplastic radiation therapy: Secondary | ICD-10-CM | POA: Diagnosis not present

## 2019-10-17 NOTE — Progress Notes (Signed)
Received patient in the clinic following final SBRT treatment to right lung. Vitals stable. Denies pain. Continuous oxygen therapy via nasal cannula noted at 3 liters. Denies cough. Denies pain or difficulty associated with swallowing. No skin changes noted within treatment field. Denies dyspnea. Fatigue continues unchanged. One month follow up appointment card given.   BP 132/83   Pulse 84   Resp 20

## 2019-10-18 ENCOUNTER — Other Ambulatory Visit: Payer: Self-pay | Admitting: Urology

## 2019-10-18 ENCOUNTER — Encounter: Payer: Self-pay | Admitting: Radiation Oncology

## 2019-10-18 ENCOUNTER — Ambulatory Visit: Payer: BC Managed Care – PPO

## 2019-10-18 DIAGNOSIS — C3411 Malignant neoplasm of upper lobe, right bronchus or lung: Secondary | ICD-10-CM

## 2019-10-18 NOTE — Progress Notes (Addendum)
Travis Cruz was seen yesterday following completion of his SBRT radiotherapy to the lung. He is a patient of Dr. Johny Shears and will follow up in surveillance with Ashlyn Bruning, PAC. He has done well with treatment and denies any concerns with shortness of breath, fever, chills, or chest pain. No other complaints are noted. He is in agreement with the plan.   On our webex visit this is a well appearing caucasian male in no acute distress. He's alert and oriented x4 and appropriate throughout the examination. Cardiopulmonary assessment is negative for acute distress and he exhibits normal effort.   He will call if he has questions or concerns prior to getting a scan. I will touch base with Ashlyn to see if she wants to schedule this or prefer that I do.     Carola Rhine, PAC

## 2019-10-20 ENCOUNTER — Ambulatory Visit: Payer: BC Managed Care – PPO

## 2019-10-21 ENCOUNTER — Telehealth: Payer: Self-pay | Admitting: Family Medicine

## 2019-10-21 DIAGNOSIS — Z86718 Personal history of other venous thrombosis and embolism: Secondary | ICD-10-CM

## 2019-10-21 DIAGNOSIS — Z86711 Personal history of pulmonary embolism: Secondary | ICD-10-CM

## 2019-10-21 NOTE — Telephone Encounter (Signed)
Description    continue on 3 mg every day  INR 2.9!  At home (goal is 2-3) at goal today Recheck in 1 to 2 weeks        Caryl Pina, MD [place] 10/21/2019, 12:57 PM

## 2019-10-21 NOTE — Telephone Encounter (Signed)
Pt aware.

## 2019-10-21 NOTE — Telephone Encounter (Signed)
Fax received mdINR PT/INR self testing service Test date/time 10/19/2019 6:55pm INR 2.9

## 2019-10-22 ENCOUNTER — Emergency Department (HOSPITAL_COMMUNITY)
Admission: EM | Admit: 2019-10-22 | Discharge: 2019-10-22 | Disposition: A | Discharge status: Home / Self Care | Payer: BC Managed Care – PPO | Attending: Emergency Medicine | Admitting: Emergency Medicine

## 2019-10-22 ENCOUNTER — Emergency Department (HOSPITAL_COMMUNITY): Payer: BC Managed Care – PPO

## 2019-10-22 ENCOUNTER — Other Ambulatory Visit: Payer: Self-pay

## 2019-10-22 ENCOUNTER — Encounter: Payer: Self-pay | Admitting: Family Medicine

## 2019-10-22 ENCOUNTER — Encounter (HOSPITAL_COMMUNITY): Payer: Self-pay

## 2019-10-22 DIAGNOSIS — I110 Hypertensive heart disease with heart failure: Secondary | ICD-10-CM | POA: Insufficient documentation

## 2019-10-22 DIAGNOSIS — I252 Old myocardial infarction: Secondary | ICD-10-CM | POA: Diagnosis not present

## 2019-10-22 DIAGNOSIS — F0391 Unspecified dementia with behavioral disturbance: Secondary | ICD-10-CM | POA: Diagnosis not present

## 2019-10-22 DIAGNOSIS — Z85118 Personal history of other malignant neoplasm of bronchus and lung: Secondary | ICD-10-CM | POA: Insufficient documentation

## 2019-10-22 DIAGNOSIS — Y999 Unspecified external cause status: Secondary | ICD-10-CM | POA: Insufficient documentation

## 2019-10-22 DIAGNOSIS — M25571 Pain in right ankle and joints of right foot: Secondary | ICD-10-CM | POA: Diagnosis not present

## 2019-10-22 DIAGNOSIS — W06XXXA Fall from bed, initial encounter: Secondary | ICD-10-CM | POA: Diagnosis not present

## 2019-10-22 DIAGNOSIS — M7989 Other specified soft tissue disorders: Secondary | ICD-10-CM | POA: Diagnosis not present

## 2019-10-22 DIAGNOSIS — W19XXXA Unspecified fall, initial encounter: Secondary | ICD-10-CM | POA: Diagnosis not present

## 2019-10-22 DIAGNOSIS — S0990XA Unspecified injury of head, initial encounter: Secondary | ICD-10-CM | POA: Diagnosis not present

## 2019-10-22 DIAGNOSIS — T1490XA Injury, unspecified, initial encounter: Secondary | ICD-10-CM | POA: Diagnosis not present

## 2019-10-22 DIAGNOSIS — Z8673 Personal history of transient ischemic attack (TIA), and cerebral infarction without residual deficits: Secondary | ICD-10-CM | POA: Insufficient documentation

## 2019-10-22 DIAGNOSIS — S3993XA Unspecified injury of pelvis, initial encounter: Secondary | ICD-10-CM | POA: Diagnosis not present

## 2019-10-22 DIAGNOSIS — J449 Chronic obstructive pulmonary disease, unspecified: Secondary | ICD-10-CM | POA: Diagnosis not present

## 2019-10-22 DIAGNOSIS — Y92003 Bedroom of unspecified non-institutional (private) residence as the place of occurrence of the external cause: Secondary | ICD-10-CM | POA: Insufficient documentation

## 2019-10-22 DIAGNOSIS — I5022 Chronic systolic (congestive) heart failure: Secondary | ICD-10-CM | POA: Diagnosis not present

## 2019-10-22 DIAGNOSIS — F0390 Unspecified dementia without behavioral disturbance: Secondary | ICD-10-CM | POA: Diagnosis not present

## 2019-10-22 DIAGNOSIS — Z7901 Long term (current) use of anticoagulants: Secondary | ICD-10-CM | POA: Diagnosis not present

## 2019-10-22 DIAGNOSIS — I2510 Atherosclerotic heart disease of native coronary artery without angina pectoris: Secondary | ICD-10-CM | POA: Insufficient documentation

## 2019-10-22 DIAGNOSIS — Y9389 Activity, other specified: Secondary | ICD-10-CM | POA: Insufficient documentation

## 2019-10-22 DIAGNOSIS — R000 Tachycardia, unspecified: Secondary | ICD-10-CM | POA: Diagnosis not present

## 2019-10-22 DIAGNOSIS — S199XXA Unspecified injury of neck, initial encounter: Secondary | ICD-10-CM | POA: Diagnosis not present

## 2019-10-22 DIAGNOSIS — Z87891 Personal history of nicotine dependence: Secondary | ICD-10-CM | POA: Insufficient documentation

## 2019-10-22 DIAGNOSIS — R5381 Other malaise: Secondary | ICD-10-CM | POA: Diagnosis not present

## 2019-10-22 DIAGNOSIS — S299XXA Unspecified injury of thorax, initial encounter: Secondary | ICD-10-CM | POA: Diagnosis not present

## 2019-10-22 LAB — CBC WITH DIFFERENTIAL/PLATELET
Abs Immature Granulocytes: 0.07 10*3/uL (ref 0.00–0.07)
Basophils Absolute: 0.1 10*3/uL (ref 0.0–0.1)
Basophils Relative: 1 %
Eosinophils Absolute: 0 10*3/uL (ref 0.0–0.5)
Eosinophils Relative: 0 %
HCT: 50.9 % (ref 39.0–52.0)
Hemoglobin: 15.7 g/dL (ref 13.0–17.0)
Immature Granulocytes: 1 %
Lymphocytes Relative: 13 %
Lymphs Abs: 1.8 10*3/uL (ref 0.7–4.0)
MCH: 27.2 pg (ref 26.0–34.0)
MCHC: 30.8 g/dL (ref 30.0–36.0)
MCV: 88.2 fL (ref 80.0–100.0)
Monocytes Absolute: 1.5 10*3/uL — ABNORMAL HIGH (ref 0.1–1.0)
Monocytes Relative: 11 %
Neutro Abs: 10.5 10*3/uL — ABNORMAL HIGH (ref 1.7–7.7)
Neutrophils Relative %: 74 %
Platelets: 373 10*3/uL (ref 150–400)
RBC: 5.77 MIL/uL (ref 4.22–5.81)
RDW: 18.6 % — ABNORMAL HIGH (ref 11.5–15.5)
WBC: 13.9 10*3/uL — ABNORMAL HIGH (ref 4.0–10.5)
nRBC: 0.1 % (ref 0.0–0.2)

## 2019-10-22 LAB — BASIC METABOLIC PANEL
Anion gap: 9 (ref 5–15)
BUN: 17 mg/dL (ref 8–23)
CO2: 31 mmol/L (ref 22–32)
Calcium: 9.3 mg/dL (ref 8.9–10.3)
Chloride: 102 mmol/L (ref 98–111)
Creatinine, Ser: 0.86 mg/dL (ref 0.61–1.24)
GFR calc Af Amer: >60 mL/min (ref >60)
GFR calc non Af Amer: >60 mL/min (ref >60)
Glucose, Bld: 108 mg/dL — ABNORMAL HIGH (ref 70–99)
Potassium: 4.1 mmol/L (ref 3.5–5.1)
Sodium: 142 mmol/L (ref 135–145)

## 2019-10-22 LAB — PROTIME-INR
INR: 2 — ABNORMAL HIGH (ref 0.8–1.2)
Prothrombin Time: 22.3 seconds — ABNORMAL HIGH (ref 11.4–15.2)

## 2019-10-22 LAB — URINALYSIS, ROUTINE W REFLEX MICROSCOPIC
Bilirubin Urine: NEGATIVE
Glucose, UA: NEGATIVE mg/dL
Hgb urine dipstick: NEGATIVE
Ketones, ur: NEGATIVE mg/dL
Leukocytes,Ua: NEGATIVE
Nitrite: NEGATIVE
Protein, ur: NEGATIVE mg/dL
Specific Gravity, Urine: 1.018 (ref 1.005–1.030)
pH: 7 (ref 5.0–8.0)

## 2019-10-22 NOTE — Discharge Instructions (Addendum)
Our testing today did not show any serious injuries, or explain a cause for the falls.  Since he is gradually getting weaker it will be a good idea to see his PCP for further testing and treatment.  He may benefit from being assessed by a physical therapist, so that they can work on specific strengthening exercises and help him with walking.  In the meantime make sure he is getting plenty of rest eating a regular diet, and always using his walker when up.  Try to assist him with walking, until you see his doctor.

## 2019-10-22 NOTE — ED Provider Notes (Signed)
7:50 AM-transition of care from prior physician.  Patient being evaluated for fall, x2, with evaluation ordered to check for unstable medical conditions.  He has a pacemaker which will be interrogated.   Clinical Course as of Oct 21 1133  Sat Oct 22, 2019  1030 Normal except mild elevation of glucose  Basic metabolic panel(!) [EW]  7416 Normal  Urinalysis, Routine w reflex microscopic [EW]  1031 INR slightly elevated  Protime-INR(!) [EW]  1031 Normal except white count high, elevated neutrophils  CBC with Differential/Platelet(!) [EW]  1031 Plain images interpreted by me no acute abnormalities or fracture.   [EW]  3845 CT images interpreted by me no acute intracranial abnormality.  Cervical spine with degenerative changes but no dislocation or fracture.   [EW]  1123 Pacemaker interrogation, [employer], did not show any events.  See attached documentation.   [EW]    Clinical Course User Index [EW] Daleen Bo, MD   Dg Chest 1 View  Result Date: 10/22/2019 CLINICAL DATA:  Fall. EXAM: CHEST  1 VIEW COMPARISON:  03/02/2019 FINDINGS: There is a right chest wall ICD with lead in the right ventricle. Stable cardiac enlargement. The lung volumes are diminished bilaterally and there are linear areas of scarring within the left midlung and left base. No superimposed pulmonary opacities identified. IMPRESSION: 1. No acute cardiopulmonary abnormalities. 2. Stable cardiac enlargement. 3. Low lung volumes. Electronically Signed   By: Kerby Moors M.D.   On: 10/22/2019 08:01   Dg Pelvis 1-2 Views  Result Date: 10/22/2019 CLINICAL DATA:  Fall. Right ankle pain and swelling. EXAM: PELVIS - 1-2 VIEW COMPARISON:  09/09/2017 FINDINGS: Postoperative changes from previous left femur fixation with IM nail and hip screw. Both hips appear located. Degenerative disc disease noted within the lumbar spine. No fracture or dislocation. IMPRESSION: No acute findings. Electronically Signed   By: Kerby Moors  M.D.   On: 10/22/2019 08:04   Dg Ankle Complete Right  Result Date: 10/22/2019 CLINICAL DATA:  Fall. Right ankle pain and swelling. EXAM: RIGHT ANKLE - COMPLETE 3+ VIEW COMPARISON:  None. FINDINGS: Previous plate and screw fixation of the distal fibula. Chronic posttraumatic deformity of the distal tibia is identified. No superimposed acute fracture identified. Mild diffuse soft tissue swelling noted. IMPRESSION: 1. Chronic healed posttraumatic deformity of the distal tibia and fibula. No acute osseous abnormality noted. 2. Soft tissue swelling. Electronically Signed   By: Kerby Moors M.D.   On: 10/22/2019 08:00   Ct Head Wo Contrast  Result Date: 10/22/2019 CLINICAL DATA:  Fall at home this morning twice. Dementia. History of CVA and lung cancer. EXAM: CT HEAD WITHOUT CONTRAST CT CERVICAL SPINE WITHOUT CONTRAST TECHNIQUE: Multidetector CT imaging of the head and cervical spine was performed following the standard protocol without intravenous contrast. Multiplanar CT image reconstructions of the cervical spine were also generated. COMPARISON:  03/02/2019 head and cervical spine CT. FINDINGS: CT HEAD FINDINGS Brain: Encephalomalacia in the left greater than right parietooccipital regions. Chronic small left cerebellar hemisphere infarct. No evidence of parenchymal hemorrhage or extra-axial fluid collection. No mass lesion, mass effect, or midline shift. No CT evidence of acute infarction. Nonspecific mild subcortical and periventricular white matter hypodensity, most in keeping with chronic small vessel ischemic change. No ventriculomegaly. Vascular: No acute abnormality. Skull: No evidence of calvarial fracture. Sinuses/Orbits: The visualized paranasal sinuses are essentially clear. Other:  The mastoid air cells are unopacified. CT CERVICAL SPINE FINDINGS Alignment: Slight reversal of the normal cervical lordosis. No facet subluxation. Dens  is well positioned between the lateral masses of C1. Stable  mild 2 mm anterolisthesis at C3-4 and C4-5. Skull base and vertebrae: No acute fracture. No primary bone lesion or focal pathologic process. Soft tissues and spinal canal: No prevertebral edema. No visible canal hematoma. Disc levels: Moderate to severe multilevel degenerative disc disease in the cervical spine, most prominent at C5-6. Advanced bilateral facet arthropathy, asymmetric to the left. Moderate bilateral facet arthropathy at C3-4 and on the left at C4-5. Severe degenerative foraminal stenosis on the right at C5-6. Upper chest: No acute abnormality. Other: Visualized mastoid air cells appear clear. No discrete thyroid nodules. No pathologically enlarged cervical nodes. IMPRESSION: 1. No evidence of acute intracranial abnormality. No evidence of calvarial fracture. 2. Encephalomalacia in the left greater than right parietooccipital regions and chronic small left cerebellar hemisphere infarct. 3. Mild chronic small vessel ischemic changes in the cerebral white matter. 4. No cervical spine fracture or traumatic subluxation. 5. Moderate to severe multilevel degenerative changes in the cervical spine as detailed. Electronically Signed   By: Ilona Sorrel M.D.   On: 10/22/2019 10:56   Ct Cervical Spine Wo Contrast  Result Date: 10/22/2019 CLINICAL DATA:  Fall at home this morning twice. Dementia. History of CVA and lung cancer. EXAM: CT HEAD WITHOUT CONTRAST CT CERVICAL SPINE WITHOUT CONTRAST TECHNIQUE: Multidetector CT imaging of the head and cervical spine was performed following the standard protocol without intravenous contrast. Multiplanar CT image reconstructions of the cervical spine were also generated. COMPARISON:  03/02/2019 head and cervical spine CT. FINDINGS: CT HEAD FINDINGS Brain: Encephalomalacia in the left greater than right parietooccipital regions. Chronic small left cerebellar hemisphere infarct. No evidence of parenchymal hemorrhage or extra-axial fluid collection. No mass lesion, mass  effect, or midline shift. No CT evidence of acute infarction. Nonspecific mild subcortical and periventricular white matter hypodensity, most in keeping with chronic small vessel ischemic change. No ventriculomegaly. Vascular: No acute abnormality. Skull: No evidence of calvarial fracture. Sinuses/Orbits: The visualized paranasal sinuses are essentially clear. Other:  The mastoid air cells are unopacified. CT CERVICAL SPINE FINDINGS Alignment: Slight reversal of the normal cervical lordosis. No facet subluxation. Dens is well positioned between the lateral masses of C1. Stable mild 2 mm anterolisthesis at C3-4 and C4-5. Skull base and vertebrae: No acute fracture. No primary bone lesion or focal pathologic process. Soft tissues and spinal canal: No prevertebral edema. No visible canal hematoma. Disc levels: Moderate to severe multilevel degenerative disc disease in the cervical spine, most prominent at C5-6. Advanced bilateral facet arthropathy, asymmetric to the left. Moderate bilateral facet arthropathy at C3-4 and on the left at C4-5. Severe degenerative foraminal stenosis on the right at C5-6. Upper chest: No acute abnormality. Other: Visualized mastoid air cells appear clear. No discrete thyroid nodules. No pathologically enlarged cervical nodes. IMPRESSION: 1. No evidence of acute intracranial abnormality. No evidence of calvarial fracture. 2. Encephalomalacia in the left greater than right parietooccipital regions and chronic small left cerebellar hemisphere infarct. 3. Mild chronic small vessel ischemic changes in the cerebral white matter. 4. No cervical spine fracture or traumatic subluxation. 5. Moderate to severe multilevel degenerative changes in the cervical spine as detailed. Electronically Signed   By: Ilona Sorrel M.D.   On: 10/22/2019 10:56    Patient Vitals for the past 24 hrs:  BP Temp Temp src Pulse Resp SpO2 Height Weight  10/22/19 1021 116/70 97.8 F (36.6 C) Oral 100 17 100 % - -   10/22/19 0535 109/74 98  F (36.7 C) Oral (!) 101 - 94 % - -  10/22/19 0526 - - - - - - 5\' 9"  (1.753 m) 102 kg    11:34 AM Reevaluation with update and discussion. After initial assessment and treatment, an updated evaluation reveals patient is alert and conversant.  He is a somewhat poor historian.  Exam is reassuring at this time.  He is talkative without dysarthria.  He is able to move arms and legs normally while supine.  Abdomen is soft and nontender.  His wife is concerned that he has been gradually getting weaker and this may have contributed to his falls.  He does use a walker for ambulation, and has a remote injury to his left leg.  I discussed the findings, with the patient as well as his wife who is at the bedside.  All questions were answered. Daleen Bo   Medical Decision Making: Falls, x2 without significant injury.  Screening evaluation for acute unstable medical conditions is negative.  Doubt cardiac instability, metabolic instability, serious bacterial infection or impending vascular collapse.  CRITICAL CARE-no Performed by: Daleen Bo  Nursing Notes Reviewed/ Care Coordinated Applicable Imaging Reviewed Interpretation of Laboratory Data incorporated into ED treatment  The patient appears reasonably screened and/or stabilized for discharge and I doubt any other medical condition or other [facility] requiring further screening, evaluation, or treatment in the ED at this time prior to discharge.  Plan: Home Medications-continue usual; Home Treatments-rest, fluids; return here if the recommended treatment, does not improve the symptoms; Recommended follow up-PCP checkup soon as possible for further evaluation and treatment.  Consider physical therapy consultation.     Daleen Bo, MD 10/22/19 1136

## 2019-10-22 NOTE — ED Triage Notes (Signed)
Per pt's wife, pt fell at home this morning x 2.  Pt denies pain, but has dementia.  Pt appears comfortable.
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
## 2019-10-22 NOTE — ED Notes (Signed)
Pt in hallway and no monitor available.

## 2019-10-24 ENCOUNTER — Telehealth: Payer: Self-pay | Admitting: Family Medicine

## 2019-10-24 NOTE — Telephone Encounter (Signed)
Appt made Friday

## 2019-10-26 DIAGNOSIS — Z86718 Personal history of other venous thrombosis and embolism: Secondary | ICD-10-CM | POA: Diagnosis not present

## 2019-10-26 DIAGNOSIS — Z7901 Long term (current) use of anticoagulants: Secondary | ICD-10-CM | POA: Diagnosis not present

## 2019-10-27 ENCOUNTER — Other Ambulatory Visit: Payer: Self-pay

## 2019-10-27 ENCOUNTER — Telehealth: Payer: Self-pay | Admitting: *Deleted

## 2019-10-27 DIAGNOSIS — Z86718 Personal history of other venous thrombosis and embolism: Secondary | ICD-10-CM

## 2019-10-27 DIAGNOSIS — Z86711 Personal history of pulmonary embolism: Secondary | ICD-10-CM

## 2019-10-27 NOTE — Telephone Encounter (Signed)
Wife aware and verbalizes understanding per dpr.  °

## 2019-10-27 NOTE — Telephone Encounter (Signed)
Description   Slightly high, hold today and then continue on 3 mg every day  INR 3.1!  At home (goal is 2-3)  Recheck in 1 to 2 weeks       Caryl Pina, MD [facility] 10/27/2019, 9:54 AM

## 2019-10-27 NOTE — Telephone Encounter (Signed)
Fax received mdINR PT/INR self testing service Test date/time 10/26/19 5:59 pm INR 3.1

## 2019-10-28 ENCOUNTER — Encounter: Payer: Self-pay | Admitting: Family Medicine

## 2019-10-28 ENCOUNTER — Ambulatory Visit (INDEPENDENT_AMBULATORY_CARE_PROVIDER_SITE_OTHER): Payer: BC Managed Care – PPO | Admitting: Family Medicine

## 2019-10-28 VITALS — BP 109/69 | HR 97 | Temp 98.6°F

## 2019-10-28 DIAGNOSIS — I255 Ischemic cardiomyopathy: Secondary | ICD-10-CM

## 2019-10-28 DIAGNOSIS — Z13220 Encounter for screening for lipoid disorders: Secondary | ICD-10-CM | POA: Diagnosis not present

## 2019-10-28 DIAGNOSIS — W19XXXA Unspecified fall, initial encounter: Secondary | ICD-10-CM | POA: Diagnosis not present

## 2019-10-28 DIAGNOSIS — R29898 Other symptoms and signs involving the musculoskeletal system: Secondary | ICD-10-CM

## 2019-10-28 NOTE — Progress Notes (Signed)
BP 109/69   Pulse 97   Temp 98.6 F (37 C) (Temporal)   SpO2 95%    Subjective:   Patient ID: Travis Cruz, male    DOB: [birth year], 73 y.o.   MRN: [id number]  HPI: Travis Cruz is a 73 y.o. male presenting on 10/28/2019 for Hospitalization Follow-up (10/24- AP fall)   HPI Patient is coming in for hospital follow-up for falls He was in [facility] on 10/22/2019 for 2 falls in that day, 1 when he slipped off of its bed trying to sit down on the edge and then another when he was trying to walk and just fell down.  He was at the hospital in the did some scans make sure he is having no bleeding or no broken bones which she did not have anything new and then since then 4 days ago he had another fall that was very similar where he missed the edge of the bed and fell down.  He denies any pain but his family who is here with him is very concerned about the weakness and the family will transfer him because he is so weak that he is too difficult for them to lift.  He did have a radiation therapy earlier this year and he has been weaker since that time.  He still has mild low back pain from being in the chair the whole time.  Relevant past medical, surgical, family and social history reviewed and updated as indicated. Interim medical history since our last visit reviewed. Allergies and medications reviewed and updated.  Review of Systems  Constitutional: Negative for chills and fever.  Respiratory: Negative for shortness of breath and wheezing.   Cardiovascular: Negative for chest pain and leg swelling.  Musculoskeletal: Positive for back pain. Negative for arthralgias and gait problem.  Skin: Negative for rash.  Neurological: Positive for weakness. Negative for dizziness, light-headedness and numbness.  All other systems reviewed and are negative.   Per HPI unless specifically indicated above   Allergies as of 10/28/2019      Reactions   Ativan [lorazepam] Anxiety,  Other (See Comments)   Hyper  [name]      Medication List       Accurate as of October 28, 2019 11:39 AM. If you have any questions, ask your nurse or doctor.        atorvastatin 80 MG tablet Commonly known as: LIPITOR TAKE 1 TABLET BY MOUTH THREE TIMES PER WEEK   finasteride 5 MG tablet Commonly known as: PROSCAR Take 1 tablet (5 mg total) by mouth daily.   furosemide 20 MG tablet Commonly known as: LASIX TAKE 2 TABLETS BY MOUTH EVERY DAY   metoprolol tartrate 25 MG tablet Commonly known as: LOPRESSOR TAKE 1.5 TABLETS (37.5 MG TOTAL) BY MOUTH 2 (TWO) TIMES DAILY.   multivitamin tablet Take 1 tablet by mouth daily.   Myrbetriq 50 MG Tb24 tablet Generic drug: mirabegron ER Take 50 mg by mouth every evening.   PRESERVISION AREDS PO Take 1 capsule by mouth 2 (two) times daily.   Spiriva HandiHaler 18 MCG inhalation capsule Generic drug: tiotropium PLACE 1 CAPSULE INTO INHALER AND INHALE DAILY   tamsulosin 0.4 MG Caps capsule Commonly known as: FLOMAX TAKE 2 CAPSULES BY MOUTH EVERY DAY   Vitamin D3 50 MCG (2000 UT) Tabs Take 1 tablet by mouth every morning.   warfarin 3 MG tablet Commonly known as: Coumadin Take as directed  by the anticoagulation clinic. If you are unsure how to take this medication, talk to your nurse or doctor. Original instructions: Take 1 tablet (3 mg total) by mouth daily.        Objective:   BP 109/69   Pulse 97   Temp 98.6 F (37 C) (Temporal)   SpO2 95%   Wt Readings from Last 3 Encounters:  10/22/19 224 lb 13.9 oz (102 kg)  10/12/19 225 lb (102.1 kg)  05/13/19 220 lb 3.2 oz (99.9 kg)    Physical Exam Vitals signs and nursing note reviewed.  Constitutional:      General: He is not in acute distress.    Appearance: He is well-developed. He is not diaphoretic.  Eyes:     General: No scleral icterus.    Conjunctiva/sclera: Conjunctivae normal.  Neck:     Thyroid: No thyromegaly.   Cardiovascular:     Rate and Rhythm: Normal rate and regular rhythm.     Heart sounds: Normal heart sounds. No murmur.  Pulmonary:     Effort: Pulmonary effort is normal. No respiratory distress.     Breath sounds: Normal breath sounds. No wheezing.  Musculoskeletal: Normal range of motion.        General: Tenderness present.  Skin:    General: Skin is warm and dry.     Findings: No rash.  Neurological:     Mental Status: He is alert and oriented to person, place, and time.     Coordination: Coordination normal.  Psychiatric:        Behavior: Behavior normal.       Assessment & Plan:   Problem List Items Addressed This Visit      Other   Fall - Primary   Relevant Orders   Ambulatory referral to [place]   CBC with Differential/Platelet   CMP14+EGFR   Lipid panel    Other Visit Diagnoses    Weakness of both lower extremities       Relevant Orders   Ambulatory referral to [place]   CBC with Differential/Platelet   CMP14+EGFR   Lipid panel   Lipid screening       Relevant Orders   Lipid panel      Face to face for HHc and therapy Follow up plan: Return in about 6 months (around 04/27/2020), or if symptoms worsen or fail to improve.  Counseling provided for all of the vaccine components Orders Placed This Encounter  Procedures  . CBC with Differential/Platelet  . CMP14+EGFR  . Lipid panel  . Ambulatory referral to [place], MD [facility] 10/28/2019, 11:39 AM

## 2019-10-29 LAB — LIPID PANEL
Chol/HDL Ratio: 3.2 ratio (ref 0.0–5.0)
Cholesterol, Total: 146 mg/dL (ref 100–199)
HDL: 46 mg/dL (ref >39)
LDL Chol Calc (NIH): 76 mg/dL (ref 0–99)
Triglycerides: 140 mg/dL (ref 0–149)
VLDL Cholesterol Cal: 24 mg/dL (ref 5–40)

## 2019-10-29 LAB — CMP14+EGFR
ALT: 25 IU/L (ref 0–44)
AST: 27 IU/L (ref 0–40)
Albumin/Globulin Ratio: 1.1 — ABNORMAL LOW (ref 1.2–2.2)
Albumin: 3.8 g/dL (ref 3.7–4.7)
Alkaline Phosphatase: 157 IU/L — ABNORMAL HIGH (ref 39–117)
BUN/Creatinine Ratio: 18 (ref 10–24)
BUN: 17 mg/dL (ref 8–27)
Bilirubin Total: 0.6 mg/dL (ref 0.0–1.2)
CO2: 29 mmol/L (ref 20–29)
Calcium: 9.7 mg/dL (ref 8.6–10.2)
Chloride: 98 mmol/L (ref 96–106)
Creatinine, Ser: 0.95 mg/dL (ref 0.76–1.27)
GFR calc Af Amer: 93 mL/min/{1.73_m2} (ref >59)
GFR calc non Af Amer: 80 mL/min/{1.73_m2} (ref >59)
Globulin, Total: 3.4 g/dL (ref 1.5–4.5)
Glucose: 96 mg/dL (ref 65–99)
Potassium: 4.7 mmol/L (ref 3.5–5.2)
Sodium: 140 mmol/L (ref 134–144)
Total Protein: 7.2 g/dL (ref 6.0–8.5)

## 2019-10-29 LAB — CBC WITH DIFFERENTIAL/PLATELET
Basophils Absolute: 0.1 10*3/uL (ref 0.0–0.2)
Basos: 1 %
EOS (ABSOLUTE): 0.3 10*3/uL (ref 0.0–0.4)
Eos: 3 %
Hematocrit: 51.2 % — ABNORMAL HIGH (ref 37.5–51.0)
Hemoglobin: 16.6 g/dL (ref 13.0–17.7)
Immature Grans (Abs): 0 10*3/uL (ref 0.0–0.1)
Immature Granulocytes: 0 %
Lymphocytes Absolute: 2.3 10*3/uL (ref 0.7–3.1)
Lymphs: 20 %
MCH: 27.4 pg (ref 26.6–33.0)
MCHC: 32.4 g/dL (ref 31.5–35.7)
MCV: 85 fL (ref 79–97)
Monocytes Absolute: 1.6 10*3/uL — ABNORMAL HIGH (ref 0.1–0.9)
Monocytes: 14 %
Neutrophils Absolute: 7.4 10*3/uL — ABNORMAL HIGH (ref 1.4–7.0)
Neutrophils: 62 %
Platelets: 268 10*3/uL (ref 150–450)
RBC: 6.05 x10E6/uL — ABNORMAL HIGH (ref 4.14–5.80)
RDW: 16 % — ABNORMAL HIGH (ref 11.6–15.4)
WBC: 11.6 10*3/uL — ABNORMAL HIGH (ref 3.4–10.8)

## 2019-11-01 ENCOUNTER — Encounter: Payer: Self-pay | Admitting: Family Medicine

## 2019-11-01 DIAGNOSIS — J449 Chronic obstructive pulmonary disease, unspecified: Secondary | ICD-10-CM | POA: Diagnosis not present

## 2019-11-01 DIAGNOSIS — E785 Hyperlipidemia, unspecified: Secondary | ICD-10-CM | POA: Diagnosis not present

## 2019-11-03 ENCOUNTER — Telehealth: Payer: Self-pay | Admitting: *Deleted

## 2019-11-03 DIAGNOSIS — Z86711 Personal history of pulmonary embolism: Secondary | ICD-10-CM

## 2019-11-03 DIAGNOSIS — Z86718 Personal history of other venous thrombosis and embolism: Secondary | ICD-10-CM

## 2019-11-03 NOTE — Telephone Encounter (Signed)
Description   Slightly high, take 2 mg every Thursday and then continue on 3 mg every other day of the week  INR 3.2!  At home (goal is 2-3)  Recheck in 1 to 2 weeks      Make sure he still has 2 mg tablets, if not send a new prescription for him  Caryl Pina, MD [place] 11/03/2019, 8:28 AM

## 2019-11-03 NOTE — Telephone Encounter (Signed)
Aware of dosing.  They will check to see how many 2 mg pills they have and call if need refill.

## 2019-11-03 NOTE — Telephone Encounter (Signed)
Fax received mdINR PT/INR self testing service Test date/time 11/02/19 6:23 pm INR 3.2

## 2019-11-04 ENCOUNTER — Other Ambulatory Visit: Payer: Self-pay | Admitting: Family Medicine

## 2019-11-04 DIAGNOSIS — E785 Hyperlipidemia, unspecified: Secondary | ICD-10-CM

## 2019-11-04 DIAGNOSIS — Z86711 Personal history of pulmonary embolism: Secondary | ICD-10-CM

## 2019-11-04 DIAGNOSIS — Z86718 Personal history of other venous thrombosis and embolism: Secondary | ICD-10-CM

## 2019-11-05 DIAGNOSIS — J449 Chronic obstructive pulmonary disease, unspecified: Secondary | ICD-10-CM | POA: Diagnosis not present

## 2019-11-08 ENCOUNTER — Telehealth: Payer: Self-pay | Admitting: *Deleted

## 2019-11-08 MED ORDER — AMOXICILLIN-POT CLAVULANATE 875-125 MG PO TABS: 1.0000 | ORAL_TABLET | Freq: Two times a day (BID) | ORAL | 0 refills | Status: DC

## 2019-11-08 NOTE — Addendum Note (Signed)
Addended by: Caryl Pina on: 11/08/2019 02:37 PM   Modules accepted: Orders

## 2019-11-08 NOTE — Telephone Encounter (Signed)
TC from [place] w[facility] Was out to see pt today for Flu shot Pt has a low grade temp of 99.1, p 100 BP 108/70 Cough, congested, wheezing in upper lobes Outer left knee has place on it that started on Saturday, that oozed bloody on Sunday and today has some puss Could an antibiotic be called into [facility] Please advise   Next INR should be on Thursday

## 2019-11-08 NOTE — Telephone Encounter (Signed)
Please let him know that I sent in Augmentin, he should consider going for covid testing over at the ED [place], drive through testing, 10-3 T-F.  This should not significantly affect coumadin dosing but continue with check next week.  Caryl Pina, MD [facility] 11/08/2019, 2:37 PM

## 2019-11-08 NOTE — Telephone Encounter (Signed)
Wife aware and verbalizes understanding per dpr.  °

## 2019-11-10 ENCOUNTER — Telehealth: Payer: Self-pay | Admitting: *Deleted

## 2019-11-10 DIAGNOSIS — Z86711 Personal history of pulmonary embolism: Secondary | ICD-10-CM

## 2019-11-10 DIAGNOSIS — Z86718 Personal history of other venous thrombosis and embolism: Secondary | ICD-10-CM

## 2019-11-10 NOTE — Telephone Encounter (Signed)
Description   Hold today's dose and then take 2 mg every Tuesday, Thursday and Saturday and then continue on 3 mg every other day of the week  INR 3.7!  At home (goal is 2-3)  Recheck in 1 to 2 weeks       Caryl Pina, MD [facility] 11/10/2019, 8:31 AM

## 2019-11-10 NOTE — Telephone Encounter (Signed)
lmtcb

## 2019-11-10 NOTE — Telephone Encounter (Signed)
Fax received mdINR PT/INR self testing service Test date/time 11/09/19 7:09 pm INR 3.7

## 2019-11-15 NOTE — Progress Notes (Signed)
  Radiation Oncology         [phone]) [phone] ________________________________  Name: Travis Cruz MRN: [id number]  Date: 10/17/2019  DOB: March 22, 1946  End of Treatment Note  Diagnosis:   New, putative metachronous stage IA NSCLC of the right upper lobe lung      Indication for treatment:  Curative, Definitive SBRT       Radiation treatment dates:   10/10/19, 10/14/19, 10/17/19  Site/dose:   The RUL lung target was treated to 54 Gy in 3 fractions of 18 Gy  Beams/energy:   The patient was treated using stereotactic body radiotherapy according to a 3D conformal radiotherapy plan.  Volumetric arc fields were employed to deliver 6 MV X-rays.  Image guidance was performed with per fraction cone beam CT prior to treatment under personal MD supervision.  Immobilization was achieved using BodyFix Pillow.  Narrative: The patient tolerated radiation treatment relatively well. He reported fatigue and continued to use his continuous oxygen therapy via nasal cannula. He denied cough, chest pain, dysphagia or skin changes.   Plan: The patient has completed radiation treatment. The patient will return to radiation oncology clinic for routine followup in one month. I advised them to call or return sooner if they have any questions or concerns related to their recovery or treatment. ________________________________  Sheral Apley. Tammi Klippel, M.D.   This document serves as a record of services personally performed by Tyler Pita, MD. It was created on his behalf by Wilburn Mylar, a trained medical scribe. The creation of this record is based on the scribe's personal observations and the provider's statements to them. This document has been checked and approved by the attending provider.

## 2019-11-15 NOTE — Telephone Encounter (Signed)
Aware. 

## 2019-11-16 DIAGNOSIS — G4733 Obstructive sleep apnea (adult) (pediatric): Secondary | ICD-10-CM | POA: Diagnosis not present

## 2019-11-17 ENCOUNTER — Telehealth: Payer: Self-pay | Admitting: *Deleted

## 2019-11-17 DIAGNOSIS — Z86718 Personal history of other venous thrombosis and embolism: Secondary | ICD-10-CM

## 2019-11-17 DIAGNOSIS — Z86711 Personal history of pulmonary embolism: Secondary | ICD-10-CM

## 2019-11-17 NOTE — Telephone Encounter (Signed)
Description   Continue to take take 2 mg every Tuesday, Thursday and Saturday and then continue on 3 mg every other day of the week  INR 2.3!  At home (goal is 2-3)  Recheck in 1 to 2 weeks       Caryl Pina, MD [facility] 11/17/2019, 12:59 PM

## 2019-11-17 NOTE — Telephone Encounter (Signed)
Fax received mdINR PT/INR self testing service Test date/time 11/17/19 7:10 am INR 2.3

## 2019-11-20 DIAGNOSIS — R404 Transient alteration of awareness: Secondary | ICD-10-CM | POA: Diagnosis not present

## 2019-11-20 DIAGNOSIS — W19XXXA Unspecified fall, initial encounter: Secondary | ICD-10-CM | POA: Diagnosis not present

## 2019-11-21 ENCOUNTER — Encounter: Payer: Self-pay | Admitting: Family Medicine

## 2019-11-21 DIAGNOSIS — Z86718 Personal history of other venous thrombosis and embolism: Secondary | ICD-10-CM | POA: Diagnosis not present

## 2019-11-21 DIAGNOSIS — Z7901 Long term (current) use of anticoagulants: Secondary | ICD-10-CM | POA: Diagnosis not present

## 2019-11-22 ENCOUNTER — Telehealth: Payer: Self-pay | Admitting: *Deleted

## 2019-11-22 DIAGNOSIS — Z86711 Personal history of pulmonary embolism: Secondary | ICD-10-CM

## 2019-11-22 DIAGNOSIS — Z86718 Personal history of other venous thrombosis and embolism: Secondary | ICD-10-CM

## 2019-11-22 NOTE — Telephone Encounter (Signed)
Fax received mdINR PT/INR self testing service Test date/time 11/21/19 5:35 pm INR 2.3

## 2019-11-22 NOTE — Telephone Encounter (Signed)
Wife aware and verbalizes understanding per dpr.  °

## 2019-11-22 NOTE — Telephone Encounter (Signed)
Continue to take take 2 mg every Tuesday, Thursday and Saturday and then continue on 3 mg every other day of the week  INR 2.3   (goal is 2-3) (Perfect!) Recheck in 1 to 2 weeks

## 2019-11-23 ENCOUNTER — Encounter: Payer: Self-pay | Admitting: Physician Assistant

## 2019-11-23 ENCOUNTER — Ambulatory Visit (INDEPENDENT_AMBULATORY_CARE_PROVIDER_SITE_OTHER): Payer: BC Managed Care – PPO | Admitting: Physician Assistant

## 2019-11-23 ENCOUNTER — Other Ambulatory Visit: Payer: Self-pay

## 2019-11-23 ENCOUNTER — Ambulatory Visit (INDEPENDENT_AMBULATORY_CARE_PROVIDER_SITE_OTHER): Payer: BC Managed Care – PPO

## 2019-11-23 DIAGNOSIS — I69911 Memory deficit following unspecified cerebrovascular disease: Secondary | ICD-10-CM

## 2019-11-23 DIAGNOSIS — I472 Ventricular tachycardia: Secondary | ICD-10-CM

## 2019-11-23 DIAGNOSIS — I255 Ischemic cardiomyopathy: Secondary | ICD-10-CM

## 2019-11-23 DIAGNOSIS — R000 Tachycardia, unspecified: Secondary | ICD-10-CM

## 2019-11-23 DIAGNOSIS — F0391 Unspecified dementia with behavioral disturbance: Secondary | ICD-10-CM

## 2019-11-23 DIAGNOSIS — D692 Other nonthrombocytopenic purpura: Secondary | ICD-10-CM | POA: Diagnosis not present

## 2019-11-23 DIAGNOSIS — I5020 Unspecified systolic (congestive) heart failure: Secondary | ICD-10-CM

## 2019-11-23 DIAGNOSIS — M1712 Unilateral primary osteoarthritis, left knee: Secondary | ICD-10-CM

## 2019-11-23 DIAGNOSIS — I4901 Ventricular fibrillation: Secondary | ICD-10-CM

## 2019-11-23 DIAGNOSIS — R233 Spontaneous ecchymoses: Secondary | ICD-10-CM

## 2019-11-23 DIAGNOSIS — R296 Repeated falls: Secondary | ICD-10-CM

## 2019-11-23 DIAGNOSIS — I2510 Atherosclerotic heart disease of native coronary artery without angina pectoris: Secondary | ICD-10-CM

## 2019-11-23 DIAGNOSIS — R238 Other skin changes: Secondary | ICD-10-CM

## 2019-11-23 DIAGNOSIS — C3411 Malignant neoplasm of upper lobe, right bronchus or lung: Secondary | ICD-10-CM | POA: Diagnosis not present

## 2019-11-23 DIAGNOSIS — J439 Emphysema, unspecified: Secondary | ICD-10-CM

## 2019-11-23 DIAGNOSIS — G4733 Obstructive sleep apnea (adult) (pediatric): Secondary | ICD-10-CM

## 2019-11-23 DIAGNOSIS — F329 Major depressive disorder, single episode, unspecified: Secondary | ICD-10-CM

## 2019-11-23 DIAGNOSIS — I110 Hypertensive heart disease with heart failure: Secondary | ICD-10-CM

## 2019-11-23 DIAGNOSIS — I5032 Chronic diastolic (congestive) heart failure: Secondary | ICD-10-CM

## 2019-11-23 DIAGNOSIS — Q211 Atrial septal defect: Secondary | ICD-10-CM

## 2019-11-23 DIAGNOSIS — I428 Other cardiomyopathies: Secondary | ICD-10-CM

## 2019-11-23 DIAGNOSIS — I252 Old myocardial infarction: Secondary | ICD-10-CM
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
## 2019-11-24 ENCOUNTER — Encounter: Payer: Self-pay | Admitting: Urology

## 2019-11-28 ENCOUNTER — Telehealth: Payer: Self-pay | Admitting: *Deleted

## 2019-11-28 DIAGNOSIS — Z86711 Personal history of pulmonary embolism: Secondary | ICD-10-CM

## 2019-11-28 DIAGNOSIS — Z86718 Personal history of other venous thrombosis and embolism: Secondary | ICD-10-CM

## 2019-11-28 NOTE — Telephone Encounter (Signed)
Description   Continue to take take 2 mg every Tuesday, Thursday and Saturday and then continue on 3 mg every other day of the week  INR 2.7   (goal is 2-3) (Perfect!) Recheck in 1 to 2 weeks       Caryl Pina, MD [place] 11/28/2019, 1:00 PM

## 2019-11-28 NOTE — Telephone Encounter (Signed)
[name] notified of INR results and directions. Verbalized understanding

## 2019-11-28 NOTE — Telephone Encounter (Signed)
Fax received mdINR PT/INR self testing service Test date/time 11/23/19 3:48 pm INR 2.7

## 2019-11-29 ENCOUNTER — Ambulatory Visit (INDEPENDENT_AMBULATORY_CARE_PROVIDER_SITE_OTHER): Payer: BC Managed Care – PPO

## 2019-11-29 ENCOUNTER — Other Ambulatory Visit: Payer: Self-pay

## 2019-11-29 ENCOUNTER — Telehealth: Payer: Self-pay | Admitting: *Deleted

## 2019-11-29 DIAGNOSIS — Z23 Encounter for immunization: Secondary | ICD-10-CM | POA: Diagnosis not present

## 2019-11-29 NOTE — Telephone Encounter (Signed)
Called patient to inform of Ct for 12-12-19 - arrival time- 7:15 am @ [facility], pt. to have water only - 4 hrs. prior to test, patient to be contacted by [name] on 12-16-19 @ 9 am for results, spoke with patient's wife- [name] and she is aware of these appts.

## 2019-11-30 ENCOUNTER — Ambulatory Visit: Payer: Medicare Other | Admitting: Urology

## 2019-11-30 ENCOUNTER — Encounter: Payer: Self-pay | Admitting: Family Medicine

## 2019-12-01 ENCOUNTER — Telehealth: Payer: Self-pay | Admitting: *Deleted

## 2019-12-01 DIAGNOSIS — J449 Chronic obstructive pulmonary disease, unspecified: Secondary | ICD-10-CM | POA: Diagnosis not present

## 2019-12-01 DIAGNOSIS — Z86711 Personal history of pulmonary embolism: Secondary | ICD-10-CM

## 2019-12-01 DIAGNOSIS — Z86718 Personal history of other venous thrombosis and embolism: Secondary | ICD-10-CM

## 2019-12-01 DIAGNOSIS — E785 Hyperlipidemia, unspecified: Secondary | ICD-10-CM | POA: Diagnosis not present

## 2019-12-01 NOTE — Telephone Encounter (Signed)
Patients wife aware and verbalized understanding

## 2019-12-01 NOTE — Telephone Encounter (Signed)
Fax received mdINR PT/INR self testing service Test date/time 11/30/19 6:01 pm INR 2.3

## 2019-12-01 NOTE — Telephone Encounter (Signed)
Description   Continue to take take 2 mg every Tuesday, Thursday and Saturday and then continue on 3 mg every other day of the week  INR 2.3   (goal is 2-3) (Perfect!) Recheck in 1 to 2 weeks       Caryl Pina, MD [place] 12/01/2019, 11:56 AM

## 2019-12-05 DIAGNOSIS — J449 Chronic obstructive pulmonary disease, unspecified: Secondary | ICD-10-CM | POA: Diagnosis not present

## 2019-12-09 ENCOUNTER — Telehealth: Payer: Self-pay | Admitting: *Deleted

## 2019-12-09 DIAGNOSIS — Z86718 Personal history of other venous thrombosis and embolism: Secondary | ICD-10-CM

## 2019-12-09 DIAGNOSIS — Z86711 Personal history of pulmonary embolism: Secondary | ICD-10-CM

## 2019-12-09 NOTE — Telephone Encounter (Signed)
Continue to take take 2 mg every Tuesday, Thursday and Saturday and then continue on 3 mg every other day of the week  INR 2.0  (goal is 2-3) (Perfect!) Recheck in 1 to 2 weeks

## 2019-12-09 NOTE — Telephone Encounter (Signed)
Fax received mdINR PT/INR self testing service Test date/time 12/07/19 5:25 pm INR 2.0

## 2019-12-09 NOTE — Telephone Encounter (Signed)
Wife aware and verbalizes understanding.  

## 2019-12-12 ENCOUNTER — Ambulatory Visit (HOSPITAL_COMMUNITY)
Admission: RE | Admit: 2019-12-12 | Discharge: 2019-12-12 | Disposition: A | Discharge status: Home / Self Care | Payer: BC Managed Care – PPO | Source: Ambulatory Visit | Attending: Urology | Admitting: Urology

## 2019-12-12 ENCOUNTER — Other Ambulatory Visit: Payer: Self-pay

## 2019-12-12 DIAGNOSIS — C3411 Malignant neoplasm of upper lobe, right bronchus or lung: Secondary | ICD-10-CM

## 2019-12-12 DIAGNOSIS — C3490 Malignant neoplasm of unspecified part of unspecified bronchus or lung: Secondary | ICD-10-CM | POA: Diagnosis not present

## 2019-12-12 LAB — POCT I-STAT CREATININE: Creatinine, Ser: 1 mg/dL (ref 0.61–1.24)

## 2019-12-12 MED ORDER — IOHEXOL 300 MG/ML  SOLN
75.0000 mL | Freq: Once | INTRAMUSCULAR | Status: AC | PRN
  Administered 2019-12-12: 75 mL via INTRAVENOUS

## 2019-12-13 ENCOUNTER — Other Ambulatory Visit: Payer: Self-pay | Admitting: Family Medicine

## 2019-12-13 ENCOUNTER — Telehealth: Payer: Self-pay | Admitting: Urology

## 2019-12-13 DIAGNOSIS — C3412 Malignant neoplasm of upper lobe, left bronchus or lung: Secondary | ICD-10-CM

## 2019-12-13 DIAGNOSIS — C3431 Malignant neoplasm of lower lobe, right bronchus or lung: Secondary | ICD-10-CM

## 2019-12-13 DIAGNOSIS — N400 Enlarged prostate without lower urinary tract symptoms: Secondary | ICD-10-CM

## 2019-12-13 DIAGNOSIS — R609 Edema, unspecified: Secondary | ICD-10-CM

## 2019-12-13 DIAGNOSIS — C3411 Malignant neoplasm of upper lobe, right bronchus or lung: Secondary | ICD-10-CM

## 2019-12-13 NOTE — Telephone Encounter (Signed)
I spoke with the patient's wife [name], due to the patient's dementia and request that communications go through his wife, to review the findings from his recent follow-up chest CT now 6 weeks status post recent SBRT to a new right upper lobe lung mass, putative stage I NSCLC.  The chest CT dated 12/12/2019 shows stability of the treated right upper lobe lesion and no new concerning findings in the lungs.  There was mention of a developing nodule in the posterior right lower lobe, measuring 11 x 14 mm however a recent PET scan from 09/26/2019 did not show any additional hypermetabolic pulmonary findings and specifically, no recurrent hypermetabolism at the sites of previous radiation therapy in the perihilar left upper and right lower lung lobes.  We discussed that we will continue to monitor this area closely on follow-up serial CT chest scans at [facility] for their convenience, and recommend his next CT scan in 3 months.  If this scan appears stable, we can then move to 84-month CT chest scans for continued observation.  We will plan to follow-up by phone after each scan to review results and any further recommendations.  Timmothy Sours appears to have a good understanding of our recommendations and is comfortable and in agreement with the stated plan.  She knows to call at anytime with any questions or concerns in the interim.  Nicholos Johns, MMS, PA-C [facility]: [phone]  Fax: [phone]

## 2019-12-14 ENCOUNTER — Telehealth: Payer: Self-pay | Admitting: *Deleted

## 2019-12-14 NOTE — Telephone Encounter (Signed)
So arr we can to schedule the appointment first or are you going to try and order CT first to see if it gets approved?

## 2019-12-14 NOTE — Telephone Encounter (Signed)
Appt made

## 2019-12-14 NOTE — Telephone Encounter (Signed)
Schedule appointment first, can be with me or somebody else but ASAP

## 2019-12-14 NOTE — Telephone Encounter (Signed)
VM from [place] w/ [facility] Pt still has pin size opening on left lateral knee w/ brown drainage that has been there for about a month. Would like to get order for CT, pt does have a rod in this leg Please advise

## 2019-12-14 NOTE — Telephone Encounter (Signed)
We can do that but we would likely have to see him in person for a visit to be able to work out again in order.  It has been a while since we have looked at it.

## 2019-12-15 ENCOUNTER — Other Ambulatory Visit: Payer: Self-pay

## 2019-12-15 ENCOUNTER — Telehealth (INDEPENDENT_AMBULATORY_CARE_PROVIDER_SITE_OTHER): Payer: BC Managed Care – PPO | Admitting: *Deleted

## 2019-12-15 DIAGNOSIS — Z86718 Personal history of other venous thrombosis and embolism: Secondary | ICD-10-CM | POA: Diagnosis not present

## 2019-12-15 DIAGNOSIS — Z86711 Personal history of pulmonary embolism: Secondary | ICD-10-CM

## 2019-12-15 NOTE — Addendum Note (Signed)
Addended by: Caryl Pina on: 12/15/2019 12:28 PM   Modules accepted: Level of Service

## 2019-12-15 NOTE — Telephone Encounter (Signed)
Description   Continue to take take 2 mg every Tuesday, Thursday and Saturday and then continue on 3 mg every other day of the week  INR 2.4  (goal is 2-3) (Perfect!) Recheck in 1 to 2 weeks       Caryl Pina, MD [facility] 12/15/2019, 12:27 PM

## 2019-12-15 NOTE — Telephone Encounter (Signed)
PAtients wife aware

## 2019-12-15 NOTE — Telephone Encounter (Signed)
Patient wife aware and verbalized understanding.

## 2019-12-15 NOTE — Telephone Encounter (Signed)
Fax received mdINR PT/INR self testing service Test date/time 12/14/19 7:49 am INR 2.4

## 2019-12-16 ENCOUNTER — Ambulatory Visit (HOSPITAL_COMMUNITY)
Admission: RE | Admit: 2019-12-16 | Discharge: 2019-12-16 | Disposition: A | Discharge status: Home / Self Care | Payer: BC Managed Care – PPO | Source: Ambulatory Visit | Attending: Family Medicine | Admitting: Family Medicine

## 2019-12-16 ENCOUNTER — Ambulatory Visit (INDEPENDENT_AMBULATORY_CARE_PROVIDER_SITE_OTHER): Payer: BC Managed Care – PPO

## 2019-12-16 ENCOUNTER — Ambulatory Visit (INDEPENDENT_AMBULATORY_CARE_PROVIDER_SITE_OTHER): Payer: BC Managed Care – PPO | Admitting: Family Medicine

## 2019-12-16 ENCOUNTER — Other Ambulatory Visit: Payer: Self-pay | Admitting: Family Medicine

## 2019-12-16 ENCOUNTER — Encounter: Payer: Self-pay | Admitting: Family Medicine

## 2019-12-16 ENCOUNTER — Ambulatory Visit: Payer: Medicare Other | Admitting: Urology

## 2019-12-16 VITALS — BP 112/69 | HR 92 | Temp 99.1°F | Ht 69.0 in

## 2019-12-16 DIAGNOSIS — R600 Localized edema: Secondary | ICD-10-CM | POA: Diagnosis not present

## 2019-12-16 DIAGNOSIS — L03116 Cellulitis of left lower limb: Secondary | ICD-10-CM

## 2019-12-16 DIAGNOSIS — I255 Ischemic cardiomyopathy: Secondary | ICD-10-CM

## 2019-12-16 DIAGNOSIS — S72002D Fracture of unspecified part of neck of left femur, subsequent encounter for closed fracture with routine healing: Secondary | ICD-10-CM | POA: Diagnosis not present

## 2019-12-16 DIAGNOSIS — L0291 Cutaneous abscess, unspecified: Secondary | ICD-10-CM

## 2019-12-16 DIAGNOSIS — M25562 Pain in left knee: Secondary | ICD-10-CM | POA: Diagnosis not present

## 2019-12-16 MED ORDER — IOHEXOL 300 MG/ML  SOLN
75.0000 mL | Freq: Once | INTRAMUSCULAR | Status: AC | PRN
  Administered 2019-12-16: 16:00:00 75 mL via INTRAVENOUS

## 2019-12-16 MED ORDER — AMOXICILLIN-POT CLAVULANATE 875-125 MG PO TABS: 1.0000 | ORAL_TABLET | Freq: Two times a day (BID) | ORAL | 0 refills | Status: DC

## 2019-12-16 NOTE — Progress Notes (Signed)
BP 112/69   Pulse 92   Temp 99.1 F (37.3 C) (Temporal)   Ht 5\' 9"  (1.753 m)   SpO2 96%   BMI 33.21 kg/m    Subjective:   Patient ID: Travis Cruz, male    DOB: September 01, 1946, 73 y.o.   MRN: [id number]  HPI: Travis Cruz is a 73 y.o. male presenting on 12/16/2019 for Recurrent Skin Infections (left knee. Homehealth nurse stated he need CT. It has been on there a month) and Neck Pain   HPI Patient is coming in for recurrent skin infection on the outside of his left knee. Is been something that is been fighting for over a month now. He has been working with home health care to try and cure it but it seems to get better and then it comes right back. He did a course of Augmentin a couple weeks ago and it did help some but did not cure it as well. Patient denies any fevers or chills but he does still have an open spot on the left lateral aspect of that knee that is draining purulent thick drainage.  Patient gets neck pain and tightness from being sent in the same spot for extended periods of time. They have been using heating pad and it seems to help. He does not move much or stretch much.  Relevant past medical, surgical, family and social history reviewed and updated as indicated. Interim medical history since our last visit reviewed. Allergies and medications reviewed and updated.  Review of Systems  Constitutional: Negative for chills and fever.  Respiratory: Negative for shortness of breath and wheezing.   Cardiovascular: Negative for chest pain and leg swelling.  Musculoskeletal: Negative for back pain and gait problem.  Skin: Positive for color change and wound. Negative for rash.  Neurological: Negative for dizziness, weakness and light-headedness.  All other systems reviewed and are negative.   Per HPI unless specifically indicated above   Allergies as of 12/16/2019      Reactions   Ativan [lorazepam] Anxiety, Other (See Comments)   Hyper  Pt become combative  with Ativan per pt's wife      Medication List       Accurate as of December 16, 2019 10:37 AM. If you have any questions, ask your nurse or doctor.        amoxicillin-clavulanate 875-125 MG tablet Commonly known as: AUGMENTIN Take 1 tablet by mouth 2 (two) times daily.   atorvastatin 80 MG tablet Commonly known as: LIPITOR TAKE 1 TABLET BY MOUTH THREE TIMES PER WEEK   finasteride 5 MG tablet Commonly known as: PROSCAR Take 1 tablet (5 mg total) by mouth daily.   furosemide 20 MG tablet Commonly known as: LASIX Take 2 tablets (40 mg total) by mouth daily. (Needs to be seen before next refill)   metoprolol tartrate 25 MG tablet Commonly known as: LOPRESSOR Take 1.5 tablets (37.5 mg total) by mouth 2 (two) times daily. (Needs to be seen before next refill)   multivitamin tablet Take 1 tablet by mouth daily.   Myrbetriq 50 MG Tb24 tablet Generic drug: mirabegron ER Take 50 mg by mouth every evening.   PRESERVISION AREDS PO Take 1 capsule by mouth 2 (two) times daily.   Spiriva HandiHaler 18 MCG inhalation capsule Generic drug: tiotropium PLACE 1 CAPSULE INTO HANDIHALER AND INHALE DAILY   tamsulosin 0.4 MG Caps capsule Commonly known as: FLOMAX Take 2 capsules (0.8 mg total) by mouth daily. (Needs to be  seen before next refill)   Vitamin D3 50 MCG (2000 UT) Tabs Take 1 tablet by mouth every morning.   warfarin 3 MG tablet Commonly known as: COUMADIN Take as directed by the anticoagulation clinic. If you are unsure how to take this medication, talk to your nurse or doctor. Original instructions: TAKE 1 TABLET BY MOUTH DAILY        Objective:   BP 112/69   Pulse 92   Temp 99.1 F (37.3 C) (Temporal)   Ht 5\' 9"  (1.753 m)   SpO2 96%   BMI 33.21 kg/m   Wt Readings from Last 3 Encounters:  10/22/19 224 lb 13.9 oz (102 kg)  10/12/19 225 lb (102.1 kg)  05/13/19 220 lb 3.2 oz (99.9 kg)    Physical Exam Vitals and nursing note reviewed.  Constitutional:       General: He is not in acute distress.    Appearance: He is well-developed. He is not diaphoretic.  Eyes:     General: No scleral icterus.    Conjunctiva/sclera: Conjunctivae normal.  Neck:     Thyroid: No thyromegaly.  Skin:    General: Skin is warm and dry.     Findings: Erythema and wound present. No rash.          Comments: Small amount of erythema surrounding each of 3 very small openings just wide enough to get a Q-tip in. On the upper one it goes in less than a quarter of a centimeter and does not seem to tunnel in any direction. Has purulent drainage in the middle limit but does not go deep at all. And on the lower one it does go slightly deeper than quarter centimeter but does not seem to tunnel in any direction specifically. All 3 have purulent drainage and small amount of erythema surrounding them.  Neurological:     Mental Status: He is alert and oriented to person, place, and time.     Coordination: Coordination normal.  Psychiatric:        Behavior: Behavior normal.       Assessment & Plan:   Problem List Items Addressed This Visit      Other   Abscess - Primary   Relevant Orders   DG Knee 1-2 Views Left   CT KNEE LEFT W CONTRAST   BUN+Creat   wound culture    Other Visit Diagnoses    Cellulitis of left lower extremity       Relevant Orders   DG Knee 1-2 Views Left   CT KNEE LEFT W CONTRAST   BUN+Creat   wound culture      Patient cannot have an MRI because he has a pacer, will order a CT scan of his knee and will do an x-ray to rule out osteomyelitis  Gave a refill of Augmentin Follow up plan: Return if symptoms worsen or fail to improve.  Counseling provided for all of the vaccine components Orders Placed This Encounter  Procedures  . wound culture  . DG Knee 1-2 Views Left  . CT KNEE LEFT W CONTRAST  . BUN+Creat    Caryl Pina, MD [facility] 12/16/2019, 10:37 AM

## 2019-12-21 DIAGNOSIS — Z7901 Long term (current) use of anticoagulants: Secondary | ICD-10-CM | POA: Diagnosis not present

## 2019-12-21 DIAGNOSIS — Z86718 Personal history of other venous thrombosis and embolism: Secondary | ICD-10-CM | POA: Diagnosis not present

## 2019-12-22 LAB — ANAEROBIC AND AEROBIC CULTURE

## 2019-12-26 ENCOUNTER — Telehealth: Payer: Self-pay | Admitting: *Deleted

## 2019-12-26 DIAGNOSIS — Z86718 Personal history of other venous thrombosis and embolism: Secondary | ICD-10-CM

## 2019-12-26 DIAGNOSIS — Z86711 Personal history of pulmonary embolism: Secondary | ICD-10-CM

## 2019-12-26 NOTE — Telephone Encounter (Signed)
Wife aware and verbalizes understanding.  

## 2019-12-26 NOTE — Telephone Encounter (Signed)
Indication: DVT Goal INR: 2-3  Recommendations:  Continue current regimen and recheck in 2 weeks.

## 2019-12-26 NOTE — Telephone Encounter (Signed)
Fax received mdINR PT/INR self testing service Test date/time 12/21/19 4:50 pm INR 2.3

## 2019-12-27 ENCOUNTER — Other Ambulatory Visit: Payer: Self-pay | Admitting: Family Medicine

## 2019-12-27 MED ORDER — SULFAMETHOXAZOLE-TRIMETHOPRIM 800-160 MG PO TABS: 1.0000 | ORAL_TABLET | Freq: Two times a day (BID) | ORAL | 0 refills | Status: AC

## 2019-12-28 ENCOUNTER — Encounter: Payer: Self-pay | Admitting: Family Medicine

## 2019-12-28 ENCOUNTER — Other Ambulatory Visit: Payer: Self-pay | Admitting: Family Medicine

## 2019-12-28 DIAGNOSIS — Z86711 Personal history of pulmonary embolism: Secondary | ICD-10-CM

## 2019-12-28 MED ORDER — WARFARIN SODIUM 2 MG PO TABS: ORAL_TABLET | ORAL | 0 refills | Status: DC

## 2019-12-28 NOTE — Telephone Encounter (Signed)
2mg  not on med list please advise

## 2020-01-02 ENCOUNTER — Telehealth: Payer: Self-pay | Admitting: *Deleted

## 2020-01-02 ENCOUNTER — Ambulatory Visit (INDEPENDENT_AMBULATORY_CARE_PROVIDER_SITE_OTHER): Payer: Self-pay | Admitting: *Deleted

## 2020-01-02 DIAGNOSIS — Z86711 Personal history of pulmonary embolism: Secondary | ICD-10-CM

## 2020-01-02 DIAGNOSIS — Z86718 Personal history of other venous thrombosis and embolism: Secondary | ICD-10-CM

## 2020-01-02 DIAGNOSIS — Z95810 Presence of automatic (implantable) cardiac defibrillator: Secondary | ICD-10-CM

## 2020-01-02 NOTE — Telephone Encounter (Signed)
Aware of provider feedback and voiced understanding.

## 2020-01-02 NOTE — Telephone Encounter (Signed)
Indication: DVT Goal INR: 2-3  INR is at goal.   Recommendations:  Continue current regimen and recheck in 2 weeks.

## 2020-01-02 NOTE — Telephone Encounter (Signed)
Fax received mdINR PT/INR self testing service Test date/time 12/28/19 6:23 pm INR 2.5

## 2020-01-03 LAB — CUP PACEART REMOTE DEVICE CHECK
Date Time Interrogation Session: 20210104065857
Implantable Lead Implant Date: 20140205
Implantable Lead Location: 753860
Implantable Lead Model: 181
Implantable Lead Serial Number: 323859
Implantable Pulse Generator Implant Date: 20140205
Pulse Gen Serial Number: 7040910

## 2020-01-04 ENCOUNTER — Other Ambulatory Visit: Payer: Self-pay | Admitting: Family Medicine

## 2020-01-04 ENCOUNTER — Telehealth: Payer: Self-pay | Admitting: *Deleted

## 2020-01-04 ENCOUNTER — Encounter (HOSPITAL_COMMUNITY): Payer: Self-pay | Admitting: Emergency Medicine

## 2020-01-04 ENCOUNTER — Emergency Department (HOSPITAL_COMMUNITY)
Admission: EM | Admit: 2020-01-04 | Discharge: 2020-01-04 | Disposition: A | Discharge status: Home / Self Care | Payer: BC Managed Care – PPO | Attending: Emergency Medicine | Admitting: Emergency Medicine

## 2020-01-04 ENCOUNTER — Emergency Department (HOSPITAL_COMMUNITY): Payer: BC Managed Care – PPO

## 2020-01-04 ENCOUNTER — Encounter: Payer: Self-pay | Admitting: Family Medicine

## 2020-01-04 ENCOUNTER — Ambulatory Visit (INDEPENDENT_AMBULATORY_CARE_PROVIDER_SITE_OTHER): Payer: BC Managed Care – PPO | Admitting: Family Medicine

## 2020-01-04 ENCOUNTER — Other Ambulatory Visit: Payer: Self-pay

## 2020-01-04 ENCOUNTER — Ambulatory Visit (INDEPENDENT_AMBULATORY_CARE_PROVIDER_SITE_OTHER): Payer: BC Managed Care – PPO

## 2020-01-04 VITALS — BP 118/73 | HR 84 | Temp 99.3°F

## 2020-01-04 DIAGNOSIS — Z7901 Long term (current) use of anticoagulants: Secondary | ICD-10-CM | POA: Insufficient documentation

## 2020-01-04 DIAGNOSIS — I509 Heart failure, unspecified: Secondary | ICD-10-CM | POA: Diagnosis not present

## 2020-01-04 DIAGNOSIS — Z86711 Personal history of pulmonary embolism: Secondary | ICD-10-CM

## 2020-01-04 DIAGNOSIS — M79604 Pain in right leg: Secondary | ICD-10-CM | POA: Diagnosis not present

## 2020-01-04 DIAGNOSIS — M25552 Pain in left hip: Secondary | ICD-10-CM

## 2020-01-04 DIAGNOSIS — Z8673 Personal history of transient ischemic attack (TIA), and cerebral infarction without residual deficits: Secondary | ICD-10-CM | POA: Insufficient documentation

## 2020-01-04 DIAGNOSIS — W19XXXA Unspecified fall, initial encounter: Secondary | ICD-10-CM

## 2020-01-04 DIAGNOSIS — J449 Chronic obstructive pulmonary disease, unspecified: Secondary | ICD-10-CM | POA: Insufficient documentation

## 2020-01-04 DIAGNOSIS — T148XXA Other injury of unspecified body region, initial encounter: Secondary | ICD-10-CM

## 2020-01-04 DIAGNOSIS — Z87891 Personal history of nicotine dependence: Secondary | ICD-10-CM | POA: Insufficient documentation

## 2020-01-04 DIAGNOSIS — F0391 Unspecified dementia with behavioral disturbance: Secondary | ICD-10-CM | POA: Insufficient documentation

## 2020-01-04 DIAGNOSIS — M25551 Pain in right hip: Secondary | ICD-10-CM | POA: Diagnosis not present

## 2020-01-04 DIAGNOSIS — C3410 Malignant neoplasm of upper lobe, unspecified bronchus or lung: Secondary | ICD-10-CM | POA: Diagnosis not present

## 2020-01-04 DIAGNOSIS — S9001XA Contusion of right ankle, initial encounter: Secondary | ICD-10-CM | POA: Diagnosis not present

## 2020-01-04 DIAGNOSIS — Z86718 Personal history of other venous thrombosis and embolism: Secondary | ICD-10-CM

## 2020-01-04 DIAGNOSIS — S8011XA Contusion of right lower leg, initial encounter: Secondary | ICD-10-CM | POA: Diagnosis not present

## 2020-01-04 DIAGNOSIS — Y999 Unspecified external cause status: Secondary | ICD-10-CM | POA: Diagnosis not present

## 2020-01-04 DIAGNOSIS — S51012A Laceration without foreign body of left elbow, initial encounter: Secondary | ICD-10-CM | POA: Diagnosis not present

## 2020-01-04 DIAGNOSIS — I110 Hypertensive heart disease with heart failure: Secondary | ICD-10-CM | POA: Diagnosis not present

## 2020-01-04 DIAGNOSIS — M79661 Pain in right lower leg: Secondary | ICD-10-CM | POA: Diagnosis not present

## 2020-01-04 DIAGNOSIS — S8991XA Unspecified injury of right lower leg, initial encounter: Secondary | ICD-10-CM | POA: Diagnosis not present

## 2020-01-04 DIAGNOSIS — Z79899 Other long term (current) drug therapy: Secondary | ICD-10-CM | POA: Diagnosis not present

## 2020-01-04 DIAGNOSIS — Y939 Activity, unspecified: Secondary | ICD-10-CM | POA: Insufficient documentation

## 2020-01-04 DIAGNOSIS — Y929 Unspecified place or not applicable: Secondary | ICD-10-CM | POA: Insufficient documentation

## 2020-01-04 DIAGNOSIS — S8012XA Contusion of left lower leg, initial encounter: Secondary | ICD-10-CM | POA: Diagnosis not present

## 2020-01-04 DIAGNOSIS — S79911A Unspecified injury of right hip, initial encounter: Secondary | ICD-10-CM | POA: Diagnosis not present

## 2020-01-04 DIAGNOSIS — W1839XA Other fall on same level, initial encounter: Secondary | ICD-10-CM | POA: Diagnosis not present

## 2020-01-04 DIAGNOSIS — S79912A Unspecified injury of left hip, initial encounter: Secondary | ICD-10-CM | POA: Diagnosis not present

## 2020-01-04 DIAGNOSIS — R5381 Other malaise: Secondary | ICD-10-CM | POA: Diagnosis not present
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
## 2020-01-04 NOTE — Telephone Encounter (Signed)
Description   Continue to take take 2 mg every Tuesday, Thursday and Saturday and then continue on 3 mg every other day of the week  INR 2.0  (goal is 2-3) (Perfect!) Recheck in 1 to 2 weeks       Caryl Pina, MD [facility] 01/04/2020, 1:02 PM

## 2020-01-04 NOTE — ED Notes (Signed)
Patient transported to X-ray 

## 2020-01-04 NOTE — Discharge Instructions (Addendum)
You were seen today and have a hematoma of the lower extremity.  There is no fracture.  Apply ice and keep a compression bandage on the hematoma.

## 2020-01-04 NOTE — ED Triage Notes (Signed)
Pt sent from his PCP due to patient falling this morning and he has a large knot on his left lower leg. He is on a blood thinner

## 2020-01-04 NOTE — Telephone Encounter (Signed)
Fax received mdINR PT/INR self testing service Test date/time 01/04/20 6:10 am INR 2.0

## 2020-01-04 NOTE — Telephone Encounter (Signed)
Wife aware and verbalizes understanding.  

## 2020-01-04 NOTE — ED Notes (Signed)
Patient returned from xray.
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
## 2020-01-05 DIAGNOSIS — J449 Chronic obstructive pulmonary disease, unspecified: Secondary | ICD-10-CM | POA: Diagnosis not present

## 2020-01-07 ENCOUNTER — Other Ambulatory Visit: Payer: Self-pay | Admitting: Family Medicine

## 2020-01-07 DIAGNOSIS — R609 Edema, unspecified: Secondary | ICD-10-CM

## 2020-01-09 ENCOUNTER — Encounter: Payer: Self-pay | Admitting: Family Medicine

## 2020-01-12 ENCOUNTER — Encounter: Payer: Self-pay | Admitting: Family Medicine

## 2020-01-12 ENCOUNTER — Telehealth (INDEPENDENT_AMBULATORY_CARE_PROVIDER_SITE_OTHER): Payer: BC Managed Care – PPO | Admitting: Family Medicine

## 2020-01-12 ENCOUNTER — Telehealth: Payer: Self-pay | Admitting: *Deleted

## 2020-01-12 DIAGNOSIS — R296 Repeated falls: Secondary | ICD-10-CM | POA: Diagnosis not present

## 2020-01-12 DIAGNOSIS — L0291 Cutaneous abscess, unspecified: Secondary | ICD-10-CM

## 2020-01-12 DIAGNOSIS — R2689 Other abnormalities of gait and mobility: Secondary | ICD-10-CM | POA: Diagnosis not present

## 2020-01-12 DIAGNOSIS — F01518 Vascular dementia, unspecified severity, with other behavioral disturbance: Secondary | ICD-10-CM

## 2020-01-12 DIAGNOSIS — R29898 Other symptoms and signs involving the musculoskeletal system: Secondary | ICD-10-CM | POA: Diagnosis not present

## 2020-01-12 DIAGNOSIS — Z86718 Personal history of other venous thrombosis and embolism: Secondary | ICD-10-CM

## 2020-01-12 DIAGNOSIS — Z86711 Personal history of pulmonary embolism: Secondary | ICD-10-CM

## 2020-01-12 DIAGNOSIS — F0151 Vascular dementia with behavioral disturbance: Secondary | ICD-10-CM

## 2020-01-12 DIAGNOSIS — T148XXA Other injury of unspecified body region, initial encounter: Secondary | ICD-10-CM

## 2020-01-12 MED ORDER — SULFAMETHOXAZOLE-TRIMETHOPRIM 800-160 MG PO TABS: 1.0000 | ORAL_TABLET | Freq: Two times a day (BID) | ORAL | 0 refills | Status: DC

## 2020-01-12 MED ORDER — SERTRALINE HCL 50 MG PO TABS: 50.0000 mg | ORAL_TABLET | Freq: Every day | ORAL | 2 refills | Status: DC

## 2020-01-12 NOTE — Telephone Encounter (Signed)
Description   Hold today's dose and then continue to take take 2 mg every Tuesday, Thursday and Saturday and then continue on 3 mg every other day of the week  INR 3.2  (goal is 2-3)  Recheck in 1 to 2 weeks       Caryl Pina, MD [place] 01/12/2020, 9:03 AM

## 2020-01-12 NOTE — Telephone Encounter (Signed)
Wife aware

## 2020-01-12 NOTE — Telephone Encounter (Signed)
Fax received mdINR PT/INR self testing service Test date/time 01/11/20 6:57 am INR 3.2

## 2020-01-12 NOTE — Progress Notes (Signed)
Virtual Visit via mychart videoNote  I connected with Travis Cruz on 01/12/20 at 1031 by video and verified that I am speaking with the correct person using two identifiers. Travis Cruz is currently located at home and Sister are currently with her during visit. The provider, Fransisca Kaufmann Lashon Hillier, MD is located in their office at time of visit.  Call ended at 1105  I discussed the limitations, risks, security and privacy concerns of performing an evaluation and management service by video and the availability of in person appointments. I also discussed with the patient that there may be a patient responsible charge related to this service. The patient expressed understanding and agreed to proceed.   History and Present Illness: Patient is calling in today because of increased weakness and recurrent falls that has been progressive and family is getting concerned.  He has had some falls by getting down by the bedside commode and even trying to transfer he has had some falls and they just take care of him.  He was doing better walking in December and he fell on the 6th of January, he had a hematoma.  4 days ago he had been feeling weak and he has been feeling weak since then and he is at risk of falling.   Patient has been having increasing memory and mood issues that have been worsening.  He has been diagnosed with vascular dementia which has led to a lot of these issues.  His sister is calling in because is been an increasing issue and they are concerned about him and are wondering if there is anything that can help at this point.  Patient has hematoma from a recent fall when he hit his leg on the right inner thigh, went to the emergency department they did not think that he had any reason for needed DVT scans and said that the leg looked good with an x-ray.  Patient still has a small abscess on the left outer knee that is draining them small amount of purulence but is much improved, will  resend another course of the antibiotic.  They are going to watch the Coumadin levels but last time the Bactrim did not raise his Coumadin levels so we will just monitor for now.  No diagnosis found.  Outpatient Encounter Medications as of 01/12/2020  Medication Sig  . atorvastatin (LIPITOR) 80 MG tablet TAKE 1 TABLET BY MOUTH THREE TIMES PER WEEK  . Cholecalciferol (VITAMIN D3) 2000 units TABS Take 1 tablet by mouth every morning.   . finasteride (PROSCAR) 5 MG tablet Take 1 tablet (5 mg total) by mouth daily.  . furosemide (LASIX) 20 MG tablet TAKE 2 TABLETS (40 MG TOTAL) BY MOUTH DAILY. (NEEDS TO BE SEEN BEFORE NEXT REFILL)  . metoprolol tartrate (LOPRESSOR) 25 MG tablet TAKE 1.5 TABLETS (37.5 MG TOTAL) BY MOUTH 2 (TWO) TIMES DAILY. (NEEDS TO BE SEEN BEFORE NEXT REFILL)  . mirabegron ER (MYRBETRIQ) 50 MG TB24 tablet Take 50 mg by mouth every evening.   . Multiple Vitamin (MULTIVITAMIN) tablet Take 1 tablet by mouth daily.  . Multiple Vitamins-Minerals (PRESERVISION AREDS PO) Take 1 capsule by mouth 2 (two) times daily.  [name] SPIRIVA HANDIHALER 18 MCG inhalation capsule PLACE 1 CAPSULE INTO HANDIHALER AND INHALE DAILY  . tamsulosin (FLOMAX) 0.4 MG CAPS capsule Take 2 capsules (0.8 mg total) by mouth daily. (Needs to be seen before next refill)  . warfarin (COUMADIN) 2 MG tablet Take 1 tablet as directed every tues, thurs, sat  .  warfarin (COUMADIN) 3 MG tablet TAKE 1 TABLET BY MOUTH DAILY   No facility-administered encounter medications on file as of 01/12/2020.    Review of Systems  Constitutional: Positive for fatigue. Negative for chills and fever.  Respiratory: Negative for shortness of breath and wheezing.   Cardiovascular: Negative for chest pain and leg swelling.  Musculoskeletal: Positive for gait problem.  Skin: Positive for wound. Negative for color change and rash.  Neurological: Positive for weakness.  Psychiatric/Behavioral: Positive for confusion, decreased concentration and  dysphoric mood. Negative for self-injury, sleep disturbance and suicidal ideas. The patient is nervous/anxious.   All other systems reviewed and are negative.   Observations/Objective: On video was able to see that he has a small hematoma on his right inner lower leg, it appears to be contained from what I can tell on the video although the quality is not great.  Also I can see that he still has a small amount of purulence able to express from the open wound on his left lateral knee.  No erythema noted  Assessment and Plan: Problem List Items Addressed This Visit      Nervous and Auditory   Dementia with behavioral disturbance (HCC)   Relevant Medications   sertraline (ZOLOFT) 50 MG tablet     Other   Abscess   Relevant Medications   sulfamethoxazole-trimethoprim (BACTRIM DS) 800-160 MG tablet   Other Relevant Orders   Ambulatory referral to [facility] [place]    Other Visit Diagnoses    Recurrent falls    -  Primary   Relevant Orders   Ambulatory referral to [place] disorder       Relevant Orders   Ambulatory referral to Home Health   Weakness of both lower extremities       Relevant Orders   Ambulatory referral to [place]   Hematoma          Patient has been having increased memory issues with behavioral disturbance and will start Zoloft to see if we can help with this, if not may consider Aricept in the future, Namenda is not likely a good choice because of his heart failure issues.  He is still having a draining small abscess on his left lateral knee, will give Bactrim. Follow up plan: No follow-ups on file.     I discussed the assessment and treatment plan with the patient. The patient was provided an opportunity to ask questions and all were answered. The patient agreed with the plan and demonstrated an understanding of the instructions.   The patient was advised to call back or seek an in-person evaluation if the symptoms worsen or if the condition  fails to improve as anticipated.  The above assessment and management plan was discussed with the patient. The patient verbalized understanding of and has agreed to the management plan. Patient is aware to call the clinic if symptoms persist or worsen. Patient is aware when to return to the clinic for a follow-up visit. Patient educated on when it is appropriate to go to the emergency department.    I provided 34 minutes of non-face-to-face time during this encounter.    Worthy Rancher, MD

## 2020-01-12 NOTE — Patient Instructions (Signed)
COVID-19 Vaccine Information can be found at: [email or web address] For questions related to vaccine distribution or appointments, please email [email or web address] or call [phone].    

## 2020-01-14 DIAGNOSIS — C3412 Malignant neoplasm of upper lobe, left bronchus or lung: Secondary | ICD-10-CM | POA: Diagnosis not present

## 2020-01-14 DIAGNOSIS — W19XXXD Unspecified fall, subsequent encounter: Secondary | ICD-10-CM | POA: Diagnosis not present

## 2020-01-14 DIAGNOSIS — I252 Old myocardial infarction: Secondary | ICD-10-CM | POA: Diagnosis not present

## 2020-01-14 DIAGNOSIS — I110 Hypertensive heart disease with heart failure: Secondary | ICD-10-CM | POA: Diagnosis not present

## 2020-01-14 DIAGNOSIS — I2510 Atherosclerotic heart disease of native coronary artery without angina pectoris: Secondary | ICD-10-CM | POA: Diagnosis not present

## 2020-01-14 DIAGNOSIS — L02416 Cutaneous abscess of left lower limb: Secondary | ICD-10-CM | POA: Diagnosis not present

## 2020-01-14 DIAGNOSIS — F0151 Vascular dementia with behavioral disturbance: Secondary | ICD-10-CM | POA: Diagnosis not present

## 2020-01-14 DIAGNOSIS — I255 Ischemic cardiomyopathy: Secondary | ICD-10-CM | POA: Diagnosis not present

## 2020-01-14 DIAGNOSIS — S7011XD Contusion of right thigh, subsequent encounter: Secondary | ICD-10-CM | POA: Diagnosis not present

## 2020-01-14 DIAGNOSIS — J439 Emphysema, unspecified: Secondary | ICD-10-CM | POA: Diagnosis not present

## 2020-01-14 DIAGNOSIS — G8222 Paraplegia, incomplete: Secondary | ICD-10-CM | POA: Diagnosis not present

## 2020-01-14 DIAGNOSIS — I509 Heart failure, unspecified: Secondary | ICD-10-CM | POA: Diagnosis not present

## 2020-01-14 DIAGNOSIS — D630 Anemia in neoplastic disease: Secondary | ICD-10-CM | POA: Diagnosis not present

## 2020-01-16 DIAGNOSIS — D630 Anemia in neoplastic disease: Secondary | ICD-10-CM | POA: Diagnosis not present

## 2020-01-16 DIAGNOSIS — I252 Old myocardial infarction: Secondary | ICD-10-CM | POA: Diagnosis not present

## 2020-01-16 DIAGNOSIS — G8222 Paraplegia, incomplete: Secondary | ICD-10-CM | POA: Diagnosis not present

## 2020-01-16 DIAGNOSIS — W19XXXD Unspecified fall, subsequent encounter: Secondary | ICD-10-CM | POA: Diagnosis not present

## 2020-01-16 DIAGNOSIS — I509 Heart failure, unspecified: Secondary | ICD-10-CM | POA: Diagnosis not present

## 2020-01-16 DIAGNOSIS — F0151 Vascular dementia with behavioral disturbance: Secondary | ICD-10-CM | POA: Diagnosis not present

## 2020-01-16 DIAGNOSIS — I2510 Atherosclerotic heart disease of native coronary artery without angina pectoris: Secondary | ICD-10-CM | POA: Diagnosis not present

## 2020-01-16 DIAGNOSIS — J439 Emphysema, unspecified: Secondary | ICD-10-CM | POA: Diagnosis not present

## 2020-01-16 DIAGNOSIS — S7011XD Contusion of right thigh, subsequent encounter: Secondary | ICD-10-CM | POA: Diagnosis not present

## 2020-01-16 DIAGNOSIS — I255 Ischemic cardiomyopathy: Secondary | ICD-10-CM | POA: Diagnosis not present

## 2020-01-16 DIAGNOSIS — C3412 Malignant neoplasm of upper lobe, left bronchus or lung: Secondary | ICD-10-CM | POA: Diagnosis not present

## 2020-01-16 DIAGNOSIS — I110 Hypertensive heart disease with heart failure: Secondary | ICD-10-CM | POA: Diagnosis not present

## 2020-01-16 DIAGNOSIS — L02416 Cutaneous abscess of left lower limb: Secondary | ICD-10-CM | POA: Diagnosis not present

## 2020-01-17 ENCOUNTER — Other Ambulatory Visit: Payer: Self-pay

## 2020-01-17 ENCOUNTER — Inpatient Hospital Stay (HOSPITAL_COMMUNITY)
Admission: EM | Admit: 2020-01-17 | Discharge: 2020-01-24 | DRG: 056 | Disposition: A | Discharge status: Home / Self Care | Payer: BC Managed Care – PPO | Attending: Family Medicine | Admitting: Family Medicine

## 2020-01-17 ENCOUNTER — Encounter: Payer: Self-pay | Admitting: Family Medicine

## 2020-01-17 ENCOUNTER — Emergency Department (HOSPITAL_COMMUNITY): Payer: BC Managed Care – PPO

## 2020-01-17 ENCOUNTER — Encounter (HOSPITAL_COMMUNITY): Payer: Self-pay | Admitting: Emergency Medicine

## 2020-01-17 DIAGNOSIS — R531 Weakness: Secondary | ICD-10-CM | POA: Diagnosis present

## 2020-01-17 DIAGNOSIS — I255 Ischemic cardiomyopathy: Secondary | ICD-10-CM | POA: Diagnosis not present

## 2020-01-17 DIAGNOSIS — W19XXXA Unspecified fall, initial encounter: Secondary | ICD-10-CM

## 2020-01-17 DIAGNOSIS — Z8673 Personal history of transient ischemic attack (TIA), and cerebral infarction without residual deficits: Secondary | ICD-10-CM

## 2020-01-17 DIAGNOSIS — Q211 Atrial septal defect: Secondary | ICD-10-CM

## 2020-01-17 DIAGNOSIS — I5032 Chronic diastolic (congestive) heart failure: Secondary | ICD-10-CM | POA: Diagnosis not present

## 2020-01-17 DIAGNOSIS — R2689 Other abnormalities of gait and mobility: Secondary | ICD-10-CM | POA: Diagnosis not present

## 2020-01-17 DIAGNOSIS — W010XXA Fall on same level from slipping, tripping and stumbling without subsequent striking against object, initial encounter: Secondary | ICD-10-CM | POA: Diagnosis present

## 2020-01-17 DIAGNOSIS — M1712 Unilateral primary osteoarthritis, left knee: Secondary | ICD-10-CM | POA: Diagnosis present

## 2020-01-17 DIAGNOSIS — S51012A Laceration without foreign body of left elbow, initial encounter: Secondary | ICD-10-CM | POA: Diagnosis present

## 2020-01-17 DIAGNOSIS — Z7189 Other specified counseling: Secondary | ICD-10-CM | POA: Diagnosis not present

## 2020-01-17 DIAGNOSIS — Z86718 Personal history of other venous thrombosis and embolism: Secondary | ICD-10-CM

## 2020-01-17 DIAGNOSIS — Z515 Encounter for palliative care: Secondary | ICD-10-CM | POA: Diagnosis not present

## 2020-01-17 DIAGNOSIS — L02416 Cutaneous abscess of left lower limb: Secondary | ICD-10-CM | POA: Diagnosis present

## 2020-01-17 DIAGNOSIS — T07XXXA Unspecified multiple injuries, initial encounter: Secondary | ICD-10-CM

## 2020-01-17 DIAGNOSIS — I69398 Other sequelae of cerebral infarction: Principal | ICD-10-CM

## 2020-01-17 DIAGNOSIS — Z86711 Personal history of pulmonary embolism: Secondary | ICD-10-CM | POA: Diagnosis present

## 2020-01-17 DIAGNOSIS — S41112A Laceration without foreign body of left upper arm, initial encounter: Secondary | ICD-10-CM | POA: Diagnosis not present

## 2020-01-17 DIAGNOSIS — S01112A Laceration without foreign body of left eyelid and periocular area, initial encounter: Secondary | ICD-10-CM | POA: Diagnosis not present

## 2020-01-17 DIAGNOSIS — I69911 Memory deficit following unspecified cerebrovascular disease: Secondary | ICD-10-CM

## 2020-01-17 DIAGNOSIS — J449 Chronic obstructive pulmonary disease, unspecified: Secondary | ICD-10-CM | POA: Diagnosis present

## 2020-01-17 DIAGNOSIS — Z888 Allergy status to other drugs, medicaments and biological substances status: Secondary | ICD-10-CM

## 2020-01-17 DIAGNOSIS — I2510 Atherosclerotic heart disease of native coronary artery without angina pectoris: Secondary | ICD-10-CM | POA: Diagnosis present

## 2020-01-17 DIAGNOSIS — R609 Edema, unspecified: Secondary | ICD-10-CM

## 2020-01-17 DIAGNOSIS — J439 Emphysema, unspecified: Secondary | ICD-10-CM | POA: Diagnosis not present

## 2020-01-17 DIAGNOSIS — Z8701 Personal history of pneumonia (recurrent): Secondary | ICD-10-CM

## 2020-01-17 DIAGNOSIS — R0902 Hypoxemia: Secondary | ICD-10-CM | POA: Diagnosis not present

## 2020-01-17 DIAGNOSIS — R569 Unspecified convulsions: Secondary | ICD-10-CM | POA: Diagnosis not present

## 2020-01-17 DIAGNOSIS — S299XXA Unspecified injury of thorax, initial encounter: Secondary | ICD-10-CM | POA: Diagnosis not present

## 2020-01-17 DIAGNOSIS — Z87891 Personal history of nicotine dependence: Secondary | ICD-10-CM

## 2020-01-17 DIAGNOSIS — I5043 Acute on chronic combined systolic (congestive) and diastolic (congestive) heart failure: Secondary | ICD-10-CM | POA: Diagnosis not present

## 2020-01-17 DIAGNOSIS — I69320 Aphasia following cerebral infarction: Secondary | ICD-10-CM

## 2020-01-17 DIAGNOSIS — F0151 Vascular dementia with behavioral disturbance: Secondary | ICD-10-CM

## 2020-01-17 DIAGNOSIS — S8001XA Contusion of right knee, initial encounter: Secondary | ICD-10-CM | POA: Diagnosis not present

## 2020-01-17 DIAGNOSIS — Z923 Personal history of irradiation: Secondary | ICD-10-CM

## 2020-01-17 DIAGNOSIS — Z8674 Personal history of sudden cardiac arrest: Secondary | ICD-10-CM

## 2020-01-17 DIAGNOSIS — R58 Hemorrhage, not elsewhere classified: Secondary | ICD-10-CM | POA: Diagnosis not present

## 2020-01-17 DIAGNOSIS — I69311 Memory deficit following cerebral infarction: Secondary | ICD-10-CM

## 2020-01-17 DIAGNOSIS — G4733 Obstructive sleep apnea (adult) (pediatric): Secondary | ICD-10-CM | POA: Diagnosis present

## 2020-01-17 DIAGNOSIS — Y92002 Bathroom of unspecified non-institutional (private) residence single-family (private) house as the place of occurrence of the external cause: Secondary | ICD-10-CM

## 2020-01-17 DIAGNOSIS — I69322 Dysarthria following cerebral infarction: Secondary | ICD-10-CM

## 2020-01-17 DIAGNOSIS — I252 Old myocardial infarction: Secondary | ICD-10-CM | POA: Diagnosis not present

## 2020-01-17 DIAGNOSIS — L03116 Cellulitis of left lower limb: Secondary | ICD-10-CM | POA: Diagnosis not present

## 2020-01-17 DIAGNOSIS — I110 Hypertensive heart disease with heart failure: Secondary | ICD-10-CM | POA: Diagnosis present

## 2020-01-17 DIAGNOSIS — R000 Tachycardia, unspecified: Secondary | ICD-10-CM | POA: Diagnosis not present

## 2020-01-17 DIAGNOSIS — R410 Disorientation, unspecified: Secondary | ICD-10-CM | POA: Diagnosis not present

## 2020-01-17 DIAGNOSIS — R296 Repeated falls: Secondary | ICD-10-CM | POA: Diagnosis present

## 2020-01-17 DIAGNOSIS — F01518 Vascular dementia, unspecified severity, with other behavioral disturbance: Secondary | ICD-10-CM

## 2020-01-17 DIAGNOSIS — Z7901 Long term (current) use of anticoagulants: Secondary | ICD-10-CM

## 2020-01-17 DIAGNOSIS — I5023 Acute on chronic systolic (congestive) heart failure: Secondary | ICD-10-CM | POA: Insufficient documentation

## 2020-01-17 DIAGNOSIS — Z20822 Contact with and (suspected) exposure to covid-19: Secondary | ICD-10-CM | POA: Diagnosis present

## 2020-01-17 DIAGNOSIS — Z79899 Other long term (current) drug therapy: Secondary | ICD-10-CM

## 2020-01-17 DIAGNOSIS — E785 Hyperlipidemia, unspecified: Secondary | ICD-10-CM

## 2020-01-17 DIAGNOSIS — S51011A Laceration without foreign body of right elbow, initial encounter: Secondary | ICD-10-CM | POA: Diagnosis not present

## 2020-01-17 DIAGNOSIS — J9611 Chronic respiratory failure with hypoxia: Secondary | ICD-10-CM | POA: Diagnosis not present

## 2020-01-17 DIAGNOSIS — Z85118 Personal history of other malignant neoplasm of bronchus and lung: Secondary | ICD-10-CM

## 2020-01-17 DIAGNOSIS — Y92009 Unspecified place in unspecified non-institutional (private) residence as the place of occurrence of the external cause: Secondary | ICD-10-CM

## 2020-01-17 DIAGNOSIS — F0150 Vascular dementia without behavioral disturbance: Secondary | ICD-10-CM | POA: Diagnosis not present

## 2020-01-17 DIAGNOSIS — I10 Essential (primary) hypertension: Secondary | ICD-10-CM | POA: Diagnosis present

## 2020-01-17 DIAGNOSIS — S0990XA Unspecified injury of head, initial encounter: Secondary | ICD-10-CM | POA: Diagnosis not present

## 2020-01-17 DIAGNOSIS — R519 Headache, unspecified: Secondary | ICD-10-CM | POA: Diagnosis not present

## 2020-01-17 DIAGNOSIS — E663 Overweight: Secondary | ICD-10-CM | POA: Diagnosis present

## 2020-01-17 DIAGNOSIS — S8002XA Contusion of left knee, initial encounter: Secondary | ICD-10-CM | POA: Diagnosis not present

## 2020-01-17 DIAGNOSIS — N400 Enlarged prostate without lower urinary tract symptoms: Secondary | ICD-10-CM

## 2020-01-17 DIAGNOSIS — Z95810 Presence of automatic (implantable) cardiac defibrillator: Secondary | ICD-10-CM

## 2020-01-17 DIAGNOSIS — S41111A Laceration without foreign body of right upper arm, initial encounter: Secondary | ICD-10-CM | POA: Diagnosis not present

## 2020-01-17 LAB — TROPONIN I (HIGH SENSITIVITY): Troponin I (High Sensitivity): 16 ng/L (ref <18)

## 2020-01-17 LAB — COMPREHENSIVE METABOLIC PANEL
ALT: 26 U/L (ref 0–44)
AST: 29 U/L (ref 15–41)
Albumin: 3.5 g/dL (ref 3.5–5.0)
Alkaline Phosphatase: 165 U/L — ABNORMAL HIGH (ref 38–126)
Anion gap: 11 (ref 5–15)
BUN: 19 mg/dL (ref 8–23)
CO2: 26 mmol/L (ref 22–32)
Calcium: 9.1 mg/dL (ref 8.9–10.3)
Chloride: 101 mmol/L (ref 98–111)
Creatinine, Ser: 1.07 mg/dL (ref 0.61–1.24)
GFR calc Af Amer: >60 mL/min (ref >60)
GFR calc non Af Amer: >60 mL/min (ref >60)
Glucose, Bld: 123 mg/dL — ABNORMAL HIGH (ref 70–99)
Potassium: 4.3 mmol/L (ref 3.5–5.1)
Sodium: 138 mmol/L (ref 135–145)
Total Bilirubin: 0.6 mg/dL (ref 0.3–1.2)
Total Protein: 7.4 g/dL (ref 6.5–8.1)

## 2020-01-17 LAB — PROTIME-INR
INR: 3.4 — ABNORMAL HIGH (ref 0.8–1.2)
Prothrombin Time: 33.9 seconds — ABNORMAL HIGH (ref 11.4–15.2)

## 2020-01-17 LAB — CBC WITH DIFFERENTIAL/PLATELET
Abs Immature Granulocytes: 0.11 10*3/uL — ABNORMAL HIGH (ref 0.00–0.07)
Basophils Absolute: 0 10*3/uL (ref 0.0–0.1)
Basophils Relative: 0 %
Eosinophils Absolute: 0 10*3/uL (ref 0.0–0.5)
Eosinophils Relative: 0 %
HCT: 51 % (ref 39.0–52.0)
Hemoglobin: 16.1 g/dL (ref 13.0–17.0)
Immature Granulocytes: 1 %
Lymphocytes Relative: 5 %
Lymphs Abs: 0.8 10*3/uL (ref 0.7–4.0)
MCH: 28.1 pg (ref 26.0–34.0)
MCHC: 31.6 g/dL (ref 30.0–36.0)
MCV: 89.2 fL (ref 80.0–100.0)
Monocytes Absolute: 1.9 10*3/uL — ABNORMAL HIGH (ref 0.1–1.0)
Monocytes Relative: 11 %
Neutro Abs: 13.6 10*3/uL — ABNORMAL HIGH (ref 1.7–7.7)
Neutrophils Relative %: 83 %
Platelets: 292 10*3/uL (ref 150–400)
RBC: 5.72 MIL/uL (ref 4.22–5.81)
RDW: 18.6 % — ABNORMAL HIGH (ref 11.5–15.5)
WBC: 16.5 10*3/uL — ABNORMAL HIGH (ref 4.0–10.5)
nRBC: 0 % (ref 0.0–0.2)

## 2020-01-17 LAB — URINALYSIS, ROUTINE W REFLEX MICROSCOPIC
Bilirubin Urine: NEGATIVE
Glucose, UA: NEGATIVE mg/dL
Hgb urine dipstick: NEGATIVE
Ketones, ur: NEGATIVE mg/dL
Leukocytes,Ua: NEGATIVE
Nitrite: NEGATIVE
Protein, ur: 30 mg/dL — AB
Specific Gravity, Urine: 1.021 (ref 1.005–1.030)
pH: 6 (ref 5.0–8.0)

## 2020-01-17 LAB — POC SARS CORONAVIRUS 2 AG -  ED: SARS Coronavirus 2 Ag: NEGATIVE

## 2020-01-17 LAB — BRAIN NATRIURETIC PEPTIDE: B Natriuretic Peptide: 84 pg/mL (ref 0.0–100.0)

## 2020-01-17 MED ORDER — ONDANSETRON HCL 4 MG PO TABS: 4.0000 mg | ORAL_TABLET | Freq: Four times a day (QID) | ORAL | Status: DC | PRN

## 2020-01-17 MED ORDER — ONDANSETRON HCL 4 MG/2ML IJ SOLN: 4.0000 mg | Freq: Four times a day (QID) | INTRAMUSCULAR | Status: DC | PRN

## 2020-01-17 MED ORDER — ACETAMINOPHEN 325 MG PO TABS: 650.0000 mg | ORAL_TABLET | Freq: Four times a day (QID) | ORAL | Status: DC | PRN

## 2020-01-17 MED ORDER — METOPROLOL TARTRATE 25 MG PO TABS
37.5000 mg | ORAL_TABLET | Freq: Once | ORAL | Status: AC
  Administered 2020-01-17: 19:00:00 37.5 mg via ORAL
  Filled 2020-01-17: qty 2

## 2020-01-17 MED ORDER — ACETAMINOPHEN 650 MG RE SUPP: 650.0000 mg | Freq: Four times a day (QID) | RECTAL | Status: DC | PRN

## 2020-01-17 MED ORDER — HYDROCODONE-ACETAMINOPHEN 5-325 MG PO TABS
1.0000 | ORAL_TABLET | Freq: Four times a day (QID) | ORAL | Status: DC | PRN
  Administered 2020-01-18 – 2020-01-19 (×3): 1 via ORAL
  Filled 2020-01-17 (×4): qty 1

## 2020-01-17 MED ORDER — SODIUM CHLORIDE 0.9 % IV BOLUS
1000.0000 mL | Freq: Once | INTRAVENOUS | Status: AC
  Administered 2020-01-17: 1000 mL via INTRAVENOUS

## 2020-01-17 NOTE — ED Notes (Signed)
Pt stood up to bedside with assistance. He became SOB and HR increased from 102 to 147. Pt was not able to stand alone. Normally at home is able to walk with walker independently or with a gait belt with his wife's assistance.

## 2020-01-17 NOTE — ED Notes (Signed)
Pt returned from CT °

## 2020-01-17 NOTE — ED Triage Notes (Signed)
PT brought in by [facility] from home with wife. EMS was called due to a fall in the bathroom while trying to transfer from the toliet to his wheelchair and fell to the ground hitting both of his elbows with skintears reports and clean dry dressing applied by EMS. PT chronically on 3L at home. PT currently on Sulfa antibiotics due to lower leg cellulitis per EMS.
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
## 2020-01-18 ENCOUNTER — Encounter (HOSPITAL_COMMUNITY): Payer: Self-pay | Admitting: Family Medicine

## 2020-01-18 ENCOUNTER — Other Ambulatory Visit: Payer: Self-pay

## 2020-01-18 ENCOUNTER — Encounter: Payer: Self-pay | Admitting: Family Medicine

## 2020-01-18 DIAGNOSIS — R296 Repeated falls: Secondary | ICD-10-CM | POA: Diagnosis not present

## 2020-01-18 LAB — BASIC METABOLIC PANEL WITH GFR
Anion gap: 10 (ref 5–15)
BUN: 16 mg/dL (ref 8–23)
CO2: 27 mmol/L (ref 22–32)
Calcium: 9 mg/dL (ref 8.9–10.3)
Chloride: 101 mmol/L (ref 98–111)
Creatinine, Ser: 0.89 mg/dL (ref 0.61–1.24)
GFR calc Af Amer: >60 mL/min
GFR calc non Af Amer: >60 mL/min
Glucose, Bld: 85 mg/dL (ref 70–99)
Potassium: 4.2 mmol/L (ref 3.5–5.1)
Sodium: 138 mmol/L (ref 135–145)

## 2020-01-18 LAB — CBC WITH DIFFERENTIAL/PLATELET
Abs Immature Granulocytes: 0.05 10*3/uL (ref 0.00–0.07)
Basophils Absolute: 0 10*3/uL (ref 0.0–0.1)
Basophils Relative: 0 %
Eosinophils Absolute: 0 10*3/uL (ref 0.0–0.5)
Eosinophils Relative: 0 %
HCT: 49.1 % (ref 39.0–52.0)
Hemoglobin: 15.2 g/dL (ref 13.0–17.0)
Immature Granulocytes: 1 %
Lymphocytes Relative: 16 %
Lymphs Abs: 1.7 10*3/uL (ref 0.7–4.0)
MCH: 27.7 pg (ref 26.0–34.0)
MCHC: 31 g/dL (ref 30.0–36.0)
MCV: 89.6 fL (ref 80.0–100.0)
Monocytes Absolute: 1.5 10*3/uL — ABNORMAL HIGH (ref 0.1–1.0)
Monocytes Relative: 14 %
Neutro Abs: 7.5 10*3/uL (ref 1.7–7.7)
Neutrophils Relative %: 69 %
Platelets: 256 10*3/uL (ref 150–400)
RBC: 5.48 MIL/uL (ref 4.22–5.81)
RDW: 19 % — ABNORMAL HIGH (ref 11.5–15.5)
WBC: 10.8 10*3/uL — ABNORMAL HIGH (ref 4.0–10.5)
nRBC: 0 % (ref 0.0–0.2)

## 2020-01-18 LAB — CK: Total CK: 197 U/L (ref 49–397)

## 2020-01-18 LAB — PROTIME-INR
INR: 3.7 — ABNORMAL HIGH (ref 0.8–1.2)
Prothrombin Time: 36.7 s — ABNORMAL HIGH (ref 11.4–15.2)

## 2020-01-18 LAB — VITAMIN B12: Vitamin B-12: 315 pg/mL (ref 180–914)

## 2020-01-18 LAB — SARS CORONAVIRUS 2 (TAT 6-24 HRS): SARS Coronavirus 2: NEGATIVE

## 2020-01-18 LAB — TSH: TSH: 0.525 u[IU]/mL (ref 0.350–4.500)

## 2020-01-18 MED ORDER — SODIUM CHLORIDE 0.9% FLUSH
3.0000 mL | Freq: Two times a day (BID) | INTRAVENOUS | Status: DC
  Administered 2020-01-19 – 2020-01-22 (×6): 3 mL via INTRAVENOUS

## 2020-01-18 MED ORDER — SODIUM CHLORIDE 0.9% FLUSH: 3.0000 mL | INTRAVENOUS | Status: DC | PRN

## 2020-01-18 MED ORDER — HYDROXYZINE HCL 25 MG PO TABS
25.0000 mg | ORAL_TABLET | Freq: Three times a day (TID) | ORAL | Status: AC | PRN
  Administered 2020-01-18: 22:00:00 25 mg via ORAL
  Filled 2020-01-18: qty 1

## 2020-01-18 MED ORDER — TIOTROPIUM BROMIDE MONOHYDRATE 18 MCG IN CAPS: 18.0000 ug | ORAL_CAPSULE | Freq: Every day | RESPIRATORY_TRACT | Status: DC

## 2020-01-18 MED ORDER — FUROSEMIDE 40 MG PO TABS
40.0000 mg | ORAL_TABLET | Freq: Every day | ORAL | Status: DC
  Administered 2020-01-18 – 2020-01-22 (×5): 40 mg via ORAL
  Filled 2020-01-18 (×5): qty 1

## 2020-01-18 MED ORDER — SODIUM CHLORIDE 0.9% FLUSH
3.0000 mL | Freq: Two times a day (BID) | INTRAVENOUS | Status: DC
  Administered 2020-01-18 – 2020-01-24 (×10): 3 mL via INTRAVENOUS

## 2020-01-18 MED ORDER — UMECLIDINIUM BROMIDE 62.5 MCG/INH IN AEPB
1.0000 | INHALATION_SPRAY | Freq: Every day | RESPIRATORY_TRACT | Status: DC
  Administered 2020-01-20 – 2020-01-24 (×5): 1 via RESPIRATORY_TRACT

## 2020-01-18 MED ORDER — SERTRALINE HCL 50 MG PO TABS
50.0000 mg | ORAL_TABLET | Freq: Every day | ORAL | Status: DC
  Administered 2020-01-18 – 2020-01-24 (×7): 50 mg via ORAL
  Filled 2020-01-18 (×7): qty 1

## 2020-01-18 MED ORDER — POLYETHYLENE GLYCOL 3350 17 G PO PACK
17.0000 g | PACK | Freq: Every day | ORAL | Status: DC | PRN
  Administered 2020-01-22: 06:00:00 17 g via ORAL
  Filled 2020-01-18: qty 1

## 2020-01-18 MED ORDER — ATORVASTATIN CALCIUM 40 MG PO TABS
80.0000 mg | ORAL_TABLET | ORAL | Status: DC
  Administered 2020-01-18 – 2020-01-23 (×3): 80 mg via ORAL
  Filled 2020-01-18 (×4): qty 2

## 2020-01-18 MED ORDER — METOPROLOL TARTRATE 25 MG PO TABS
37.5000 mg | ORAL_TABLET | Freq: Two times a day (BID) | ORAL | Status: DC
  Administered 2020-01-18 – 2020-01-24 (×13): 37.5 mg via ORAL
  Filled 2020-01-18 (×13): qty 2

## 2020-01-18 MED ORDER — FINASTERIDE 5 MG PO TABS
5.0000 mg | ORAL_TABLET | Freq: Every day | ORAL | Status: DC
  Administered 2020-01-18 – 2020-01-24 (×7): 5 mg via ORAL
  Filled 2020-01-18 (×7): qty 1

## 2020-01-18 MED ORDER — TAMSULOSIN HCL 0.4 MG PO CAPS
0.8000 mg | ORAL_CAPSULE | Freq: Every day | ORAL | Status: DC
  Administered 2020-01-18 – 2020-01-24 (×7): 0.8 mg via ORAL
  Filled 2020-01-18 (×7): qty 2

## 2020-01-18 MED ORDER — SULFAMETHOXAZOLE-TRIMETHOPRIM 800-160 MG PO TABS
1.0000 | ORAL_TABLET | Freq: Two times a day (BID) | ORAL | Status: AC
  Administered 2020-01-18 – 2020-01-22 (×9): 1 via ORAL
  Filled 2020-01-18 (×11): qty 1

## 2020-01-18 MED ORDER — MIRABEGRON ER 25 MG PO TB24
50.0000 mg | ORAL_TABLET | Freq: Every evening | ORAL | Status: DC
  Administered 2020-01-18 – 2020-01-24 (×7): 50 mg via ORAL
  Filled 2020-01-18 (×7): qty 2

## 2020-01-18 MED ORDER — SODIUM CHLORIDE 0.9 % IV SOLN: 250.0000 mL | INTRAVENOUS | Status: DC | PRN

## 2020-01-18 NOTE — Plan of Care (Signed)

## 2020-01-18 NOTE — Progress Notes (Signed)
Pt placed on nasal cpap 5cm H20 with 3L oxygen bleed in.  Pt tolerating well at this time.  RT will continue to monitor.

## 2020-01-18 NOTE — Evaluation (Signed)
Physical Therapy Evaluation Patient Details Name: Travis Cruz MRN: [id number] DOB: 27-Jul-1946 Today's Date: 01/18/2020   History of Present Illness  Travis Cruz is a 74 y.o. male with medical history significant for history of DVT and PE on Coumadin, coronary artery disease, history of CVA, vascular dementia, and COPD with chronic hypoxic respiratory failure, now presenting to the emergency department for evaluation of recurrent falls.  Patient is accompanied by his wife who assists with the history.  Patient uses a walker ambulator at home but requires assistance while doing this, mainly to keep his oxygen tubing out of his way.  Over the past 4 months or so, the patient has had more trouble ambulating and has had recurrent falls.  He was attempting to transfer from the toilet by himself tonight when he fell onto his knees and elbows, suffering skin tears to the bilateral elbows.  Patient denies losing consciousness, denies any significant pain from the fall, and neither patient nor family at noticed any new focal neurologic deficits.  Patient's wife explains that they have home health services and family is available to assist the patient, but with his progressive ambulatory difficulties and gradually worsening memory loss, family does not feel that home health has been adequate and had tried to get the patient admitted to a skilled nursing facility, but the site insurance issues and bed availability as a barrier previously.  Patient denies any fevers or chills, denies any recent cough, has continued to use 3 L/min of supplemental oxygen, denies any chest pain or palpitations.  He has been on Bactrim for a small abscess at the left leg that has continued to drain a scant amount of purulent material.    Clinical Impression  Patient very unsteady on feet, limited to a few steps before stating, "my legs are giving out," at high risk for falls and also c/o SOB/weakness.  Patient tolerated sitting up in  chair after therapy - RN notified.  Patient will benefit from continued physical therapy in hospital and recommended venue below to increase strength, balance, endurance for safe ADLs and gait.     Follow Up Recommendations SNF    Equipment Recommendations  None recommended by PT    Recommendations for Other Services       Precautions / Restrictions Precautions Precautions: Fall Restrictions Weight Bearing Restrictions: No      Mobility  Bed Mobility Overal bed mobility: Needs Assistance Bed Mobility: Supine to Sit     Supine to sit: Min guard;HOB elevated     General bed mobility comments: increased time, labored movement  Transfers Overall transfer level: Needs assistance Equipment used: Rolling walker (2 wheeled) Transfers: Sit to/from [facility] Sit to Stand: Min assist Stand pivot transfers: Min assist       General transfer comment: slow labored unsteady movement  Ambulation/Gait   Gait Distance (Feet): 6 Feet Assistive device: Rolling walker (2 wheeled) Gait Pattern/deviations: Decreased step length - left;Decreased stance time - right;Decreased stride length Gait velocity: decreased   General Gait Details: limited to 6-7 slow labored unsteady steps secondary to c/o weakness, SOB  Stairs            Wheelchair Mobility    Modified Rankin (Stroke Patients Only)       Balance Overall balance assessment: Needs assistance Sitting-balance support: Feet supported;No upper extremity supported Sitting balance-Leahy Scale: Fair Sitting balance - Comments: seated at EOB   Standing balance support: During functional activity;Bilateral upper extremity supported Standing balance-Leahy Scale: Poor Standing  balance comment: fair/poor using RW                             Pertinent Vitals/Pain Pain Assessment: No/denies pain    Home Living Family/patient expects to be discharged to:: Private residence Living  Arrangements: Spouse/significant other Available Help at Discharge: Family;Available PRN/intermittently Type of Home: House Home Access: Ramped entrance     Home Layout: One level Home Equipment: Walker - 2 wheels;Wheelchair - manual;Bedside commode      Prior Function Level of Independence: Needs assistance   Gait / Transfers Assistance Needed: Supervised short distanced household ambulator using RW, on 3 LPM O2  ADL's / Homemaking Assistance Needed: assisted by spouse        Hand Dominance   Dominant Hand: Right    Extremity/Trunk Assessment   Upper Extremity Assessment Upper Extremity Assessment: Generalized weakness    Lower Extremity Assessment Lower Extremity Assessment: Generalized weakness    Cervical / Trunk Assessment Cervical / Trunk Assessment: Normal  Communication   Communication: No difficulties  Cognition Arousal/Alertness: Awake/alert Behavior During Therapy: WFL for tasks assessed/performed Overall Cognitive Status: Within Functional Limits for tasks assessed                                        General Comments      Exercises     Assessment/Plan    PT Assessment Patient needs continued PT services  PT Problem List Decreased strength;Decreased activity tolerance;Decreased balance;Decreased mobility       PT Treatment Interventions Balance training;Gait training;Functional mobility training;Therapeutic activities;Therapeutic exercise;Patient/family education    PT Goals (Current goals can be found in the Care Plan section)  Acute Rehab PT Goals Patient Stated Goal: return home after rehab PT Goal Formulation: With patient Time For Goal Achievement: 02/01/20 Potential to Achieve Goals: Good    Frequency Min 3X/week   Barriers to discharge        Co-evaluation               AM-PAC PT "6 Clicks" Mobility  Outcome Measure Help needed turning from your back to your side while in a flat bed without using  bedrails?: None Help needed moving from lying on your back to sitting on the side of a flat bed without using bedrails?: A Little Help needed moving to and from a bed to a chair (including a wheelchair)?: A Lot Help needed standing up from a chair using your arms (e.g., wheelchair or bedside chair)?: A Lot Help needed to walk in hospital room?: A Lot Help needed climbing 3-5 steps with a railing? : Total 6 Click Score: 14    End of Session Equipment Utilized During Treatment: Oxygen Activity Tolerance: Patient tolerated treatment well;Patient limited by fatigue Patient left: in chair;with call bell/phone within reach;with chair alarm set Nurse Communication: Mobility status PT Visit Diagnosis: Unsteadiness on feet (R26.81);Other abnormalities of gait and mobility (R26.89);Muscle weakness (generalized) (M62.81)    Time: 4403-4742 PT Time Calculation (min) (ACUTE ONLY): 29 min   Charges:   PT Evaluation $PT Eval Moderate Complexity: 1 Mod PT Treatments $Therapeutic Activity: 23-37 mins        9:32 AM, 01/18/20 [name], MPT Physical Therapist with [facility] [phone] office [phone] mobile phone

## 2020-01-18 NOTE — Progress Notes (Signed)
PROGRESS NOTE    Travis Cruz  XBM:[id number] DOB: February 05, 1946 DOA: 01/17/2020 PCP: Dettinger, Fransisca Kaufmann, MD    Brief Narrative:  This is a 74 year old man who primarily has vascular dementia.He presents now with frequent falls.He also has COPD. Over the last 4 months, apparently has had several falls.  He has home health services.  He has had progressive ambulatory difficulties and gradually worsening of memory.   Assessment & Plan:   Principal Problem:   Recurrent falls Active Problems:   CAD (coronary artery disease)   COPD (chronic obstructive pulmonary disease) (HCC)   History of pulmonary embolus (PE)   History of CVA (cerebrovascular accident)   Chronic diastolic CHF (congestive heart failure) (HCC)   Memory deficit following unspecified cerebrovascular disease   Hypertension   Chronic respiratory failure with hypoxia (HCC)   Abscess of left leg   1.  Recurrent falls.  I think this is likely multifactorial, primarily based on his vascular dementia.  I will seek neurology consultation for further recommendations.  There does not appear to be a new CVA on CT head scan. 2.  History of DVT/pulmonary embolism.  On warfarin and INR is appropriate. 3.  Left leg cellulitis.  Continue with Bactrim. 4.  COPD.  Chronic.  No exacerbation. 5.  Chronic diastolic congestive heart failure.  Compensated.  Continue with medications.   DVT prophylaxis: Warfarin. Code Status: Full code. Family Communication: Wife has been updated yesterday. Disposition Plan: Depending on hospital progress and recommendations from physical therapy.  He may need to be admitted to skilled nursing facility.   Consultants:   Neurology.  Procedures:   None.  Antimicrobials:   Bactrim.   Subjective: He denies any complaints.  It is clear that he is confused and this is probably chronic.  He thinks that the current president is called Dietitian.  Objective: Vitals:   01/17/20 2100 01/18/20  0008 01/18/20 0021 01/18/20 0443  BP: 105/63 111/74  114/69  Pulse: 76 84  94  Resp: 16 16  20   Temp:  98 F (36.7 C)  (!) 97.4 F (36.3 C)  TempSrc:  Oral  Oral  SpO2: 96% 95%  90%  Weight:   100.6 kg   Height:   5\' 9"  (1.753 m)     Intake/Output Summary (Last 24 hours) at 01/18/2020 1101 Last data filed at 01/18/2020 0900 Gross per 24 hour  Intake 240 ml  Output 200 ml  Net 40 ml   Filed Weights   01/17/20 1341 01/18/20 0021  Weight: 99 kg 100.6 kg    Examination: Hemodynamically stable.  No obvious focal neurological signs.  Not orientated in time, place or person.    Data Reviewed: I have personally reviewed following labs and imaging studies  CBC: Recent Labs  Lab 01/17/20 1503 01/18/20 0636  WBC 16.5* 10.8*  NEUTROABS 13.6* 7.5  HGB 16.1 15.2  HCT 51.0 49.1  MCV 89.2 89.6  PLT 292 027   Basic Metabolic Panel: Recent Labs  Lab 01/17/20 1503 01/18/20 0636  NA 138 138  K 4.3 4.2  CL 101 101  CO2 26 27  GLUCOSE 123* 85  BUN 19 16  CREATININE 1.07 0.89  CALCIUM 9.1 9.0   GFR: Estimated Creatinine Clearance: 89 mL/min (by C-G formula based on SCr of 0.89 mg/dL). Liver Function Tests: Recent Labs  Lab 01/17/20 1503  AST 29  ALT 26  ALKPHOS 165*  BILITOT 0.6  PROT 7.4  ALBUMIN 3.5   No results  for input(s): LIPASE, AMYLASE in the last 168 hours. No results for input(s): AMMONIA in the last 168 hours. Coagulation Profile: Recent Labs  Lab 01/17/20 1503 01/18/20 0636  INR 3.4* 3.7*   Cardiac Enzymes: Recent Labs  Lab 01/18/20 0636  CKTOTAL 197   BNP (last 3 results) No results for input(s): PROBNP in the last 8760 hours. HbA1C: No results for input(s): HGBA1C in the last 72 hours. CBG: No results for input(s): GLUCAP in the last 168 hours. Lipid Profile: No results for input(s): CHOL, HDL, LDLCALC, TRIG, CHOLHDL, LDLDIRECT in the last 72 hours. Thyroid Function Tests: Recent Labs    01/18/20 0636  TSH 0.525   Anemia  Panel: Recent Labs    01/18/20 0636  VITAMINB12 315   Sepsis Labs: No results for input(s): PROCALCITON, LATICACIDVEN in the last 168 hours.  Recent Results (from the past 240 hour(s))  SARS CORONAVIRUS 2 (TAT 6-24 HRS) Nasopharyngeal Nasopharyngeal Swab     Status: None   Collection Time: 01/17/20  5:40 PM   Specimen: Nasopharyngeal Swab  Result Value Ref Range Status   SARS Coronavirus 2 NEGATIVE NEGATIVE Final    Comment: (NOTE) SARS-CoV-2 target nucleic acids are NOT DETECTED. The SARS-CoV-2 RNA is generally detectable in upper and lower respiratory specimens during the acute phase of infection. Negative results do not preclude SARS-CoV-2 infection, do not rule out co-infections with other pathogens, and should not be used as the sole basis for treatment or other patient management decisions. Negative results must be combined with clinical observations, patient history, and epidemiological information. The expected result is Negative. Fact Sheet for Patients: SugarRoll.be Fact Sheet for Healthcare Providers: https://www.woods-mathews.com/ This test is not yet approved or cleared by the [place] FDA and  has been authorized for detection and/or diagnosis of SARS-CoV-2 by FDA under an Emergency Use Authorization (EUA). This EUA will remain  in effect (meaning this test can be used) for the duration of the COVID-19 declaration under Section 56 4(b)(1) of the Act, 21 U.S.C. section 360bbb-3(b)(1), unless the authorization is terminated or revoked sooner. Performed at [facility], [place] [address], [place], [place] [address]          Radiology Studies: CT Head Wo Contrast  Result Date: 01/17/2020 CLINICAL DATA:  Recent fall with headache, initial encounter EXAM: CT HEAD WITHOUT CONTRAST TECHNIQUE: Contiguous axial images were obtained from the base of the skull through the vertex without intravenous contrast. COMPARISON:   10/22/2019 FINDINGS: Brain: Chronic atrophic changes and white matter ischemic changes are seen. There are findings consistent with bilateral posterior parietal infarcts stable from the previous exam. Old left cerebellar infarct is noted as well. No findings to suggest acute hemorrhage, acute infarction or space-occupying mass lesion are noted. Vascular: No hyperdense vessel or unexpected calcification. Skull: Normal. Negative for fracture or focal lesion. Sinuses/Orbits: No acute finding. Other: None. IMPRESSION: Chronic atrophic and ischemic changes without acute abnormality. Electronically Signed   By: Inez Catalina M.D.   On: 01/17/2020 14:55   DG Chest Port 1 View  Result Date: 01/17/2020 CLINICAL DATA:  Weakness. Additional history provided by technologist: Patient presents after fall at home in bathroom while trying to transfer EXAM: PORTABLE CHEST 1 VIEW COMPARISON:  Chest CT 12/12/2019, chest radiograph 10/22/2019 FINDINGS: Redemonstrated right chest single lead AICD device. Unchanged cardiomegaly. Shallow inspiration radiograph with medial right basilar atelectasis. Left perihilar scarring has increased in conspicuity since prior examination 10/22/2019. No evidence of pleural effusion or pneumothorax. No displaced fracture  is identified. IMPRESSION: Shallow inspiration radiograph with right basilar atelectasis. Left perihilar scarring has increased in conspicuity since prior chest radiograph 10/22/2019. Superimposed airspace disease is difficult to definitively exclude. Unchanged cardiomegaly. Electronically Signed   By: Kellie Simmering DO   On: 01/17/2020 16:17        Scheduled Meds: . atorvastatin  80 mg Oral Q M,W,F  . finasteride  5 mg Oral Daily  . furosemide  40 mg Oral Daily  . metoprolol tartrate  37.5 mg Oral BID  . mirabegron ER  50 mg Oral QPM  . sertraline  50 mg Oral Daily  . sodium chloride flush  3 mL Intravenous Q12H  . sodium chloride flush  3 mL Intravenous Q12H  .  sulfamethoxazole-trimethoprim  1 tablet Oral BID  . tamsulosin  0.8 mg Oral Daily  . umeclidinium bromide  1 puff Inhalation Daily   Continuous Infusions: . sodium chloride       LOS: 0 days       Brodin Gelpi Luther Parody, MD [facility]   If 7PM-7AM, please contact night-coverage [email or web address]  01/18/2020, 11:01 AM

## 2020-01-18 NOTE — Plan of Care (Signed)
  Problem: Acute Rehab PT Goals(only PT should resolve) Goal: Pt Will Go Supine/Side To Sit Outcome: Progressing Flowsheets (Taken 01/18/2020 0934) Pt will go Supine/Side to Sit: with supervision Goal: Patient Will Transfer Sit To/From Stand Outcome: Progressing Flowsheets (Taken 01/18/2020 0934) Patient will transfer sit to/from stand:  with min guard assist  with minimal assist Goal: Pt Will Transfer Bed To Chair/Chair To Bed Outcome: Progressing Flowsheets (Taken 01/18/2020 0934) Pt will Transfer Bed to Chair/Chair to Bed: with min assist Goal: Pt Will Ambulate Outcome: Progressing Flowsheets (Taken 01/18/2020 0934) Pt will Ambulate:  25 feet  with minimal assist  with rolling walker   9:34 AM, 01/18/20 [name], MPT Physical Therapist with [facility] [phone] office [phone] mobile phone

## 2020-01-19 DIAGNOSIS — Z86718 Personal history of other venous thrombosis and embolism: Secondary | ICD-10-CM | POA: Diagnosis not present

## 2020-01-19 DIAGNOSIS — Z7189 Other specified counseling: Secondary | ICD-10-CM

## 2020-01-19 DIAGNOSIS — I110 Hypertensive heart disease with heart failure: Secondary | ICD-10-CM | POA: Diagnosis present

## 2020-01-19 DIAGNOSIS — I255 Ischemic cardiomyopathy: Secondary | ICD-10-CM | POA: Diagnosis present

## 2020-01-19 DIAGNOSIS — R296 Repeated falls: Secondary | ICD-10-CM | POA: Diagnosis present

## 2020-01-19 DIAGNOSIS — I69398 Other sequelae of cerebral infarction: Secondary | ICD-10-CM | POA: Diagnosis not present

## 2020-01-19 DIAGNOSIS — J439 Emphysema, unspecified: Secondary | ICD-10-CM | POA: Diagnosis present

## 2020-01-19 DIAGNOSIS — M1712 Unilateral primary osteoarthritis, left knee: Secondary | ICD-10-CM | POA: Diagnosis present

## 2020-01-19 DIAGNOSIS — Z7901 Long term (current) use of anticoagulants: Secondary | ICD-10-CM | POA: Diagnosis not present

## 2020-01-19 DIAGNOSIS — J449 Chronic obstructive pulmonary disease, unspecified: Secondary | ICD-10-CM | POA: Diagnosis not present

## 2020-01-19 DIAGNOSIS — T07XXXA Unspecified multiple injuries, initial encounter: Secondary | ICD-10-CM | POA: Diagnosis present

## 2020-01-19 DIAGNOSIS — S51012A Laceration without foreign body of left elbow, initial encounter: Secondary | ICD-10-CM | POA: Diagnosis present

## 2020-01-19 DIAGNOSIS — Z515 Encounter for palliative care: Secondary | ICD-10-CM

## 2020-01-19 DIAGNOSIS — J9611 Chronic respiratory failure with hypoxia: Secondary | ICD-10-CM | POA: Diagnosis present

## 2020-01-19 DIAGNOSIS — Q211 Atrial septal defect: Secondary | ICD-10-CM | POA: Diagnosis not present

## 2020-01-19 DIAGNOSIS — I69322 Dysarthria following cerebral infarction: Secondary | ICD-10-CM | POA: Diagnosis not present

## 2020-01-19 DIAGNOSIS — Z20822 Contact with and (suspected) exposure to covid-19: Secondary | ICD-10-CM | POA: Diagnosis present

## 2020-01-19 DIAGNOSIS — F0150 Vascular dementia without behavioral disturbance: Secondary | ICD-10-CM | POA: Diagnosis present

## 2020-01-19 DIAGNOSIS — E663 Overweight: Secondary | ICD-10-CM | POA: Diagnosis present

## 2020-01-19 DIAGNOSIS — L02416 Cutaneous abscess of left lower limb: Secondary | ICD-10-CM | POA: Diagnosis present

## 2020-01-19 DIAGNOSIS — L03116 Cellulitis of left lower limb: Secondary | ICD-10-CM | POA: Diagnosis present

## 2020-01-19 DIAGNOSIS — Y92002 Bathroom of unspecified non-institutional (private) residence single-family (private) house as the place of occurrence of the external cause: Secondary | ICD-10-CM | POA: Diagnosis not present

## 2020-01-19 DIAGNOSIS — I252 Old myocardial infarction: Secondary | ICD-10-CM | POA: Diagnosis not present

## 2020-01-19 DIAGNOSIS — R531 Weakness: Secondary | ICD-10-CM | POA: Diagnosis present

## 2020-01-19 DIAGNOSIS — I69320 Aphasia following cerebral infarction: Secondary | ICD-10-CM | POA: Diagnosis not present

## 2020-01-19 DIAGNOSIS — I5032 Chronic diastolic (congestive) heart failure: Secondary | ICD-10-CM | POA: Diagnosis not present

## 2020-01-19 DIAGNOSIS — I5023 Acute on chronic systolic (congestive) heart failure: Secondary | ICD-10-CM | POA: Diagnosis not present

## 2020-01-19 DIAGNOSIS — G4733 Obstructive sleep apnea (adult) (pediatric): Secondary | ICD-10-CM | POA: Diagnosis present

## 2020-01-19 DIAGNOSIS — I2510 Atherosclerotic heart disease of native coronary artery without angina pectoris: Secondary | ICD-10-CM | POA: Diagnosis present

## 2020-01-19 DIAGNOSIS — I69311 Memory deficit following cerebral infarction: Secondary | ICD-10-CM | POA: Diagnosis not present

## 2020-01-19 DIAGNOSIS — I5043 Acute on chronic combined systolic (congestive) and diastolic (congestive) heart failure: Secondary | ICD-10-CM | POA: Diagnosis not present

## 2020-01-19 DIAGNOSIS — W010XXA Fall on same level from slipping, tripping and stumbling without subsequent striking against object, initial encounter: Secondary | ICD-10-CM | POA: Diagnosis present

## 2020-01-19 LAB — FOLATE RBC
Folate, Hemolysate: 474 ng/mL
Folate, RBC: 981 ng/mL (ref >498)
Hematocrit: 48.3 % (ref 37.5–51.0)

## 2020-01-19 LAB — PROTIME-INR
INR: 3.2 — ABNORMAL HIGH (ref 0.8–1.2)
Prothrombin Time: 33 seconds — ABNORMAL HIGH (ref 11.4–15.2)

## 2020-01-19 MED ORDER — SODIUM CHLORIDE 0.9 % IV SOLN: INTRAVENOUS | Status: DC

## 2020-01-19 MED ORDER — ENOXAPARIN SODIUM 30 MG/0.3ML ~~LOC~~ SOLN: 30.0000 mg | SUBCUTANEOUS | Status: DC

## 2020-01-19 MED ORDER — UMECLIDINIUM BROMIDE 62.5 MCG/INH IN AEPB
INHALATION_SPRAY | RESPIRATORY_TRACT | Status: AC
  Administered 2020-01-19: 1 via RESPIRATORY_TRACT
  Filled 2020-01-19: qty 7

## 2020-01-19 NOTE — Progress Notes (Signed)
Physical Therapy Treatment Patient Details Name: Travis Cruz MRN: [id number] DOB: June 09, 1946 Today's Date: 01/19/2020    History of Present Illness Travis Cruz is a 74 y.o. male with medical history significant for history of DVT and PE on Coumadin, coronary artery disease, history of CVA, vascular dementia, and COPD with chronic hypoxic respiratory failure, now presenting to the emergency department for evaluation of recurrent falls.  Patient is accompanied by his wife who assists with the history.  Patient uses a walker ambulator at home but requires assistance while doing this, mainly to keep his oxygen tubing out of his way.  Over the past 4 months or so, the patient has had more trouble ambulating and has had recurrent falls.  He was attempting to transfer from the toilet by himself tonight when he fell onto his knees and elbows, suffering skin tears to the bilateral elbows.  Patient denies losing consciousness, denies any significant pain from the fall, and neither patient nor family at noticed any new focal neurologic deficits.  Patient's wife explains that they have home health services and family is available to assist the patient, but with his progressive ambulatory difficulties and gradually worsening memory loss, family does not feel that home health has been adequate and had tried to get the patient admitted to a skilled nursing facility, but the site insurance issues and bed availability as a barrier previously.  Patient denies any fevers or chills, denies any recent cough, has continued to use 3 L/min of supplemental oxygen, denies any chest pain or palpitations.  He has been on Bactrim for a small abscess at the left leg that has continued to drain a scant amount of purulent material.    PT Comments    Pt received in supine; agreeable to transfer to chair but no further activity other than than.  PT required general assistance for transfers due to safety, weakness and instability.  Pt  tends to reach and pull rather than push self forward for bed mobility and with transfers.  PT required min guard assist for hand placement and cues for completing safe transfer.  PT with noted fatigue following transfer.   Chair alarm in place.  Call bell and phone within reach.  Completed session while keeping oxygen in place.     Follow Up Recommendations  SNF     Equipment Recommendations  None recommended by PT    Recommendations for Other Services       Precautions / Restrictions Precautions Precautions: Fall Restrictions Weight Bearing Restrictions: No                                                   Cognition Arousal/Alertness: Awake/alert Behavior During Therapy: WFL for tasks assessed/performed Overall Cognitive Status: Within Functional Limits for tasks assessed                                             PT Goals (current goals can now be found in the care plan section) Acute Rehab PT Goals Patient Stated Goal: return home after rehab PT Goal Formulation: With patient Potential to Achieve Goals: Good Progress towards PT goals: Progressing toward goals    Frequency    Min 3X/week  PT Plan  continue progression towards goals.       AM-PAC PT "6 Clicks" Mobility   Outcome Measure  Help needed turning from your back to your side while in a flat bed without using bedrails?: None Help needed moving from lying on your back to sitting on the side of a flat bed without using bedrails?: A Little Help needed moving to and from a bed to a chair (including a wheelchair)?: A Lot Help needed standing up from a chair using your arms (e.g., wheelchair or bedside chair)?: A Lot Help needed to walk in hospital room?: A Lot Help needed climbing 3-5 steps with a railing? : Total 6 Click Score: 14    End of Session Equipment Utilized During Treatment: Gait belt;Oxygen Activity Tolerance: Patient limited by fatigue Patient  left: in chair;with call bell/phone within reach;with chair alarm set Nurse Communication: Mobility status PT Visit Diagnosis: Unsteadiness on feet (R26.81);Other abnormalities of gait and mobility (R26.89);Muscle weakness (generalized) (M62.81)     Time: 0272-5366 PT Time Calculation (min) (ACUTE ONLY): 25 min  Charges:  $Therapeutic Activity: 23-37 mins                     Teena Irani, PTA/CLT [place], Brieanna Nau B 01/19/2020, 5:18 PM

## 2020-01-19 NOTE — Clinical Social Work Note (Signed)
Patient from home with wife. Admitted for Recurrent falls; generalized weakness. At baseline wife helps with ADLS. Ambulates with a walker. Wife takes to appointments. PT recommendations discussed with wife. Spouse agreeable to SNF. Choices discussed. Referrals sent to facilities.        Muaaz Brau, Clydene Pugh, LCSW
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
## 2020-01-19 NOTE — Progress Notes (Addendum)
PROGRESS NOTE    Travis Cruz  QPY:[id number] DOB: 1946-07-21 DOA: 01/17/2020 PCP: Dettinger, Fransisca Kaufmann, MD    Brief Narrative:  74 year old man who has vascular dementia was admitted with frequent falls.  He also has COPD.  Over the last 4 months he has apparently had several falls and he does have home health services.  It appears that the family had become more distressed in being able to cope with his medical problems and they brought him to the emergency room.  He has had progressive ambulation difficulties and gradual worsening of memory.   Assessment & Plan:   Principal Problem:   Recurrent falls Active Problems:   CAD (coronary artery disease)   COPD (chronic obstructive pulmonary disease) (HCC)   History of pulmonary embolus (PE)   History of CVA (cerebrovascular accident)   Chronic diastolic CHF (congestive heart failure) (HCC)   Memory deficit following unspecified cerebrovascular disease   Hypertension   Chronic respiratory failure with hypoxia ([facility])   Abscess of left leg   1.  Recurrent falls.  This is likely to be multifactorial based on his vascular dementia but I am awaiting neurology consultation for further input.  There is does not appear to be a new stroke on CT head scan.  I am not sure that we can do MRI brain scan because of his cardiac procedure in the past.  I think his overall prognosis appears to be relatively poor and I will seek palliative medicine consultation today. 2.  History of DVT/pulmonary embolism.  His warfarin has been held.  In view of his multiple falls, I think it would be unsafe to continue with warfarin at this time. 3.  Left leg cellulitis.  Continue with Bactrim. 4.  COPD.  Chronic.  No exacerbation. 5.  Chronic diastolic congestive heart failure.  This appears to be compensated.  Continue with home medications. 6.  Resting tachycardia with poor p.o. intake.  Start gentle IV fluids.   DVT prophylaxis: SCDs Code Status: Full  code.  Disposition Plan: Physical therapy is already seen the patient.  The patient came from home but physical therapy is recommending skilled nursing facility which I agree with.  I would like to have neurology input and also palliative medicine input before he can go to skilled nursing facility.   Consultants:   Neurology.  Palliative medicine.  Procedures:   None.  Antimicrobials:   Bactrim.   Subjective: The patient says yes to every question I asked him.  He has no other complaints.  Objective: Vitals:   01/18/20 2157 01/18/20 2200 01/19/20 0645 01/19/20 0815  BP: (!) 157/90  135/82   Pulse: (!) 113 100 96   Resp: 20     Temp: 98 F (36.7 C)  98.4 F (36.9 C)   TempSrc: Oral  Oral   SpO2: 90%  92% 94%  Weight:      Height:        Intake/Output Summary (Last 24 hours) at 01/19/2020 1004 Last data filed at 01/19/2020 1245 Gross per 24 hour  Intake 483 ml  Output 575 ml  Net -92 ml   Filed Weights   01/17/20 1341 01/18/20 0021  Weight: 99 kg 100.6 kg    Examination:  Alert but clearly not oriented in time place or person.  Hemodynamically stable.  Afebrile.   Data Reviewed: I have personally reviewed following labs and imaging studies  CBC: Recent Labs  Lab 01/17/20 1503 01/18/20 0636  WBC 16.5* 10.8*  NEUTROABS  13.6* 7.5  HGB 16.1 15.2  HCT 51.0 49.1  MCV 89.2 89.6  PLT 292 662   Basic Metabolic Panel: Recent Labs  Lab 01/17/20 1503 01/18/20 0636  NA 138 138  K 4.3 4.2  CL 101 101  CO2 26 27  GLUCOSE 123* 85  BUN 19 16  CREATININE 1.07 0.89  CALCIUM 9.1 9.0   GFR: Estimated Creatinine Clearance: 89 mL/min (by C-G formula based on SCr of 0.89 mg/dL). Liver Function Tests: Recent Labs  Lab 01/17/20 1503  AST 29  ALT 26  ALKPHOS 165*  BILITOT 0.6  PROT 7.4  ALBUMIN 3.5   No results for input(s): LIPASE, AMYLASE in the last 168 hours. No results for input(s): AMMONIA in the last 168 hours. Coagulation Profile: Recent  Labs  Lab 01/17/20 1503 01/18/20 0636 01/19/20 0646  INR 3.4* 3.7* 3.2*   Cardiac Enzymes: Recent Labs  Lab 01/18/20 0636  CKTOTAL 197   BNP (last 3 results) No results for input(s): PROBNP in the last 8760 hours. HbA1C: No results for input(s): HGBA1C in the last 72 hours. CBG: No results for input(s): GLUCAP in the last 168 hours. Lipid Profile: No results for input(s): CHOL, HDL, LDLCALC, TRIG, CHOLHDL, LDLDIRECT in the last 72 hours. Thyroid Function Tests: Recent Labs    01/18/20 0636  TSH 0.525   Anemia Panel: Recent Labs    01/18/20 0636  VITAMINB12 315   Sepsis Labs: No results for input(s): PROCALCITON, LATICACIDVEN in the last 168 hours.  Recent Results (from the past 240 hour(s))  SARS CORONAVIRUS 2 (TAT 6-24 HRS) Nasopharyngeal Nasopharyngeal Swab     Status: None   Collection Time: 01/17/20  5:40 PM   Specimen: Nasopharyngeal Swab  Result Value Ref Range Status   SARS Coronavirus 2 NEGATIVE NEGATIVE Final    Comment: (NOTE) SARS-CoV-2 target nucleic acids are NOT DETECTED. The SARS-CoV-2 RNA is generally detectable in upper and lower respiratory specimens during the acute phase of infection. Negative results do not preclude SARS-CoV-2 infection, do not rule out co-infections with other pathogens, and should not be used as the sole basis for treatment or other patient management decisions. Negative results must be combined with clinical observations, patient history, and epidemiological information. The expected result is Negative. Fact Sheet for Patients: SugarRoll.be Fact Sheet for Healthcare Providers: https://www.woods-mathews.com/ This test is not yet approved or cleared by the [place] FDA and  has been authorized for detection and/or diagnosis of SARS-CoV-2 by FDA under an Emergency Use Authorization (EUA). This EUA will remain  in effect (meaning this test can be used) for the duration of  the COVID-19 declaration under Section 56 4(b)(1) of the Act, 21 U.S.C. section 360bbb-3(b)(1), unless the authorization is terminated or revoked sooner. Performed at [facility], [place] [address], [place], [place] [address]          Radiology Studies: CT Head Wo Contrast  Result Date: 01/17/2020 CLINICAL DATA:  Recent fall with headache, initial encounter EXAM: CT HEAD WITHOUT CONTRAST TECHNIQUE: Contiguous axial images were obtained from the base of the skull through the vertex without intravenous contrast. COMPARISON:  10/22/2019 FINDINGS: Brain: Chronic atrophic changes and white matter ischemic changes are seen. There are findings consistent with bilateral posterior parietal infarcts stable from the previous exam. Old left cerebellar infarct is noted as well. No findings to suggest acute hemorrhage, acute infarction or space-occupying mass lesion are noted. Vascular: No hyperdense vessel or unexpected calcification. Skull: Normal. Negative for fracture or focal  lesion. Sinuses/Orbits: No acute finding. Other: None. IMPRESSION: Chronic atrophic and ischemic changes without acute abnormality. Electronically Signed   By: Inez Catalina M.D.   On: 01/17/2020 14:55   DG Chest Port 1 View  Result Date: 01/17/2020 CLINICAL DATA:  Weakness. Additional history provided by technologist: Patient presents after fall at home in bathroom while trying to transfer EXAM: PORTABLE CHEST 1 VIEW COMPARISON:  Chest CT 12/12/2019, chest radiograph 10/22/2019 FINDINGS: Redemonstrated right chest single lead AICD device. Unchanged cardiomegaly. Shallow inspiration radiograph with medial right basilar atelectasis. Left perihilar scarring has increased in conspicuity since prior examination 10/22/2019. No evidence of pleural effusion or pneumothorax. No displaced fracture is identified. IMPRESSION: Shallow inspiration radiograph with right basilar atelectasis. Left perihilar scarring has increased in conspicuity  since prior chest radiograph 10/22/2019. Superimposed airspace disease is difficult to definitively exclude. Unchanged cardiomegaly. Electronically Signed   By: Kellie Simmering DO   On: 01/17/2020 16:17        Scheduled Meds: . atorvastatin  80 mg Oral Q M,W,F  . finasteride  5 mg Oral Daily  . furosemide  40 mg Oral Daily  . metoprolol tartrate  37.5 mg Oral BID  . mirabegron ER  50 mg Oral QPM  . sertraline  50 mg Oral Daily  . sodium chloride flush  3 mL Intravenous Q12H  . sodium chloride flush  3 mL Intravenous Q12H  . sulfamethoxazole-trimethoprim  1 tablet Oral BID  . tamsulosin  0.8 mg Oral Daily  . umeclidinium bromide  1 puff Inhalation Daily   Continuous Infusions: . sodium chloride       LOS: 0 days       Evaristo Tsuda Luther Parody, MD [facility]   If 7PM-7AM, please contact night-coverage [email or web address]  01/19/2020, 10:04 AM

## 2020-01-19 NOTE — NC FL2 (Signed)
[place] LEVEL OF CARE SCREENING TOOL     IDENTIFICATION  Patient Name: Travis Cruz Birthdate: 1946/05/08 Sex: male Admission Date (Current Location): 01/17/2020  [facility] and [place] Number:  [employer] and Address:         Provider Number: [id number]  Attending Physician Name and Address:  Doree Albee, MD  Relative Name and Phone Number:       Current Level of Care: Hospital Recommended Level of Care: [place] Prior Approval Number:    Date Approved/Denied:   PASRR Number: [id number]  Discharge Plan: Home    Current Diagnoses: Patient Active Problem List   Diagnosis Date Noted  . Vascular dementia without behavioral disturbance ([place])   . Recurrent falls 01/17/2020  . Chronic respiratory failure with hypoxia ([place]) 01/17/2020  . Abscess of left leg 01/17/2020  . Primary cancer of right upper lobe of lung ([place]) 10/04/2019  . Memory deficit following unspecified cerebrovascular disease 09/08/2019  . Dementia with behavioral disturbance ([place]) 09/08/2019  . Nodule of upper lobe of right lung 09/07/2019  . Fall 03/02/2019  . Senile purpura ([place]) 11/02/2017  . Preoperative cardiovascular examination   . Chronic diastolic CHF (congestive heart failure) ([place])   . Closed left hip fracture, initial encounter ([place])   . Supratherapeutic INR   . Hip fracture requiring operative repair ([place]) 09/07/2017  . Hip fracture ([place]) 09/07/2017  . Abrasion, right knee, initial encounter   . Forehead contusion   . Cancer of bronchus of right lower lobe ([place]) 02/25/2017  . History of DVT (deep vein thrombosis) 12/04/2016  . History of pulmonary embolus (PE) 12/04/2016  . History of CVA (cerebrovascular accident) 12/04/2016  . Macular degeneration, dry 12/20/2015  . Abscess 09/27/2015  . COPD (chronic obstructive pulmonary disease) ([place])   . Implantable cardioverter-defibrillator single chamber [place] 10/05/2013  . Malignant  neoplasm of upper lobe of lung ([place]) 09/16/2013  . Abnormal chest x-ray 05/13/2013  . Hyperlipidemia 05/13/2013  . CAD (coronary artery disease) 05/13/2013  . Transient ischemic attack (TIA), and cerebral infarction without residual deficits 03/28/2013  . History of ventricular fibrillation 03/28/2013  . Generalized ischemic myocardial dysfunction 03/15/2013  . NSTEMI (non-ST elevated myocardial infarction) ([place]) 01/27/2013  . Acute non-ST-elevation myocardial infarction ([place]) 01/27/2013  . Obstructive sleep apnea 03/10/2012  . Patent arterial duct 03/10/2012  . Obstructive sleep apnea syndrome in adult 03/10/2012  . HTN (hypertension) 03/24/2011  . Chronic obstructive pulmonary disease ([place]) 03/24/2011  . Hypertension 03/24/2011  . Stroke ([place]) 12/29/2009  . Deep vein thrombosis ([place]) 09/23/2008    Orientation RESPIRATION BLADDER Height & Weight     Self, Time, Situation  Normal Incontinent Weight: 221 lb 11.2 oz (100.6 kg) Height:  5\' 9"  (175.3 cm)  BEHAVIORAL SYMPTOMS/MOOD NEUROLOGICAL BOWEL NUTRITION STATUS      Incontinent Diet(heart healthy)  AMBULATORY STATUS COMMUNICATION OF NEEDS Skin   Limited Assist Verbally Normal                       Personal Care Assistance Level of Assistance  Bathing, Feeding, Dressing Bathing Assistance: Limited assistance Feeding assistance: Independent Dressing Assistance: Limited assistance     Functional Limitations Info  Sight, Hearing, Speech Sight Info: Adequate Hearing Info: Adequate Speech Info: Adequate    SPECIAL CARE FACTORS FREQUENCY  PT (By licensed PT)     PT Frequency: 5x/week              Contractures  Additional Factors Info  Code Status, Allergies, Psychotropic Code Status Info: Full Code Allergies Info: Ativan Psychotropic Info: Zoloft         Current Medications (01/19/2020):  This is the current hospital active medication list Current Facility-Administered Medications  Medication Dose  Route Frequency Provider Last Rate Last Admin  . 0.9 %  sodium chloride infusion  250 mL Intravenous PRN Opyd, Ilene Qua, MD      . 0.9 %  sodium chloride infusion   Intravenous Continuous Gosrani, Nimish C, MD 50 mL/hr at 01/19/20 1517 Rate Verify at 01/19/20 1517  . acetaminophen (TYLENOL) tablet 650 mg  650 mg Oral Q6H PRN Opyd, Ilene Qua, MD       Or  . acetaminophen (TYLENOL) suppository 650 mg  650 mg Rectal Q6H PRN Opyd, Ilene Qua, MD      . atorvastatin (LIPITOR) tablet 80 mg  80 mg Oral Q M,W,F Opyd, Ilene Qua, MD   80 mg at 01/18/20 1135  . finasteride (PROSCAR) tablet 5 mg  5 mg Oral Daily Opyd, Ilene Qua, MD   5 mg at 01/19/20 1048  . furosemide (LASIX) tablet 40 mg  40 mg Oral Daily Opyd, Ilene Qua, MD   40 mg at 01/19/20 1048  . HYDROcodone-acetaminophen (NORCO/VICODIN) 5-325 MG per tablet 1 tablet  1 tablet Oral Q6H PRN Opyd, Ilene Qua, MD   1 tablet at 01/19/20 1048  . metoprolol tartrate (LOPRESSOR) tablet 37.5 mg  37.5 mg Oral BID Opyd, Ilene Qua, MD   37.5 mg at 01/19/20 1048  . mirabegron ER (MYRBETRIQ) tablet 50 mg  50 mg Oral QPM Opyd, Ilene Qua, MD   50 mg at 01/18/20 1728  . ondansetron (ZOFRAN) tablet 4 mg  4 mg Oral Q6H PRN Opyd, Ilene Qua, MD       Or  . ondansetron (ZOFRAN) injection 4 mg  4 mg Intravenous Q6H PRN Opyd, Ilene Qua, MD      . polyethylene glycol (MIRALAX / GLYCOLAX) packet 17 g  17 g Oral Daily PRN Opyd, Ilene Qua, MD      . sertraline (ZOLOFT) tablet 50 mg  50 mg Oral Daily Opyd, Ilene Qua, MD   50 mg at 01/19/20 1048  . sodium chloride flush (NS) 0.9 % injection 3 mL  3 mL Intravenous Q12H Opyd, Ilene Qua, MD   3 mL at 01/19/20 1509  . sodium chloride flush (NS) 0.9 % injection 3 mL  3 mL Intravenous Q12H Opyd, Ilene Qua, MD   3 mL at 01/19/20 1050  . sodium chloride flush (NS) 0.9 % injection 3 mL  3 mL Intravenous PRN Opyd, Ilene Qua, MD      . sulfamethoxazole-trimethoprim (BACTRIM DS) 800-160 MG per tablet 1 tablet  1 tablet Oral BID Opyd, Ilene Qua, MD   1 tablet at 01/19/20 1047  . tamsulosin (FLOMAX) capsule 0.8 mg  0.8 mg Oral Daily Opyd, Ilene Qua, MD   0.8 mg at 01/19/20 1047  . umeclidinium bromide (INCRUSE ELLIPTA) 62.5 MCG/INH 1 puff  1 puff Inhalation Daily Opyd, Ilene Qua, MD   1 puff at 01/19/20 0981     Discharge Medications: Please see discharge summary for a list of discharge medications.  Relevant Imaging Results:  Relevant Lab Results:   Additional Information SSN: [id number]  [place] Gudiel, Clydene Pugh, LCSW

## 2020-01-19 NOTE — Plan of Care (Signed)
  Problem: Health Behavior/Discharge Planning: Goal: Ability to manage health-related needs will improve Outcome: Progressing   Problem: Clinical Measurements: Goal: Will remain free from infection Outcome: Progressing   

## 2020-01-20 DIAGNOSIS — R2689 Other abnormalities of gait and mobility: Secondary | ICD-10-CM | POA: Diagnosis not present

## 2020-01-20 LAB — GLUCOSE, CAPILLARY: Glucose-Capillary: 91 mg/dL (ref 70–99)

## 2020-01-20 LAB — PROTIME-INR
INR: 2.8 — ABNORMAL HIGH (ref 0.8–1.2)
Prothrombin Time: 29.3 seconds — ABNORMAL HIGH (ref 11.4–15.2)

## 2020-01-20 NOTE — Progress Notes (Signed)
PROGRESS NOTE    Travis Cruz  FTD:[id number] DOB: 1946-08-31 DOA: 01/17/2020 PCP: Dettinger, Fransisca Kaufmann, MD    Brief Narrative:  74 year old man who has vascular dementia was admitted with frequent falls.  He also has COPD.  Over the last 4 months he has apparently had several falls and he does have home health services.  It appears that the family had become more distressed in being able to cope with his medical problems and they brought him to the emergency room.  He has had progressive ambulation difficulties and gradual worsening of memory.   Assessment & Plan:   Principal Problem:   Recurrent falls Active Problems:   CAD (coronary artery disease)   COPD (chronic obstructive pulmonary disease) (HCC)   History of pulmonary embolus (PE)   History of CVA (cerebrovascular accident)   Chronic diastolic CHF (congestive heart failure) (HCC)   Memory deficit following unspecified cerebrovascular disease   Hypertension   Chronic respiratory failure with hypoxia (HCC)   Abscess of left leg   Vascular dementia without behavioral disturbance [facility])  Brief summary 74 y.o. male with medical history significant for history of DVT and PE on Coumadin, coronary artery disease, history of CVA, vascular dementia, and COPD with chronic hypoxic respiratory failure admitted on 01/17/2020 with recurrent falls  A/p 1.  Recurrent falls/generalized weakness.  This is likely to be multifactorial based on his vascular dementia/history of prior strokes, as well as osteoarthritis--- neurology and physical therapy evaluation appreciated, -CT head without acute findings -No reversible cause for falls and weakness noted --Trial of physical therapy rehab advised Unable to do MRI brain scan due to AICD   2.  History of DVT/pulmonary embolism- -INR may trend up due to Bactrim use (cytochrome P450 competition)--Coumadin currently on hold  3)  Left leg cellulitis-much improved, okay to complete Bactrim.  4.   COPD.  Chronic.  No exacerbation-continue supplemental oxygen and bronchodilators  5. HFpEF/ Chronic diastolic congestive heart failure--- last known EF over 65%, this appears to be compensated.  Continue Lasix and metoprolol  6)Social/Ethics--patient is a full code, elective care consult appreciated, -Plan of care discussed with patient's wife  7)H/o prior stroke/H/o CAD--- asymptomatic at this time continue atorvastatin, Coumadin on hold,, continue metoprolol   DVT prophylaxis: SCDs/Coumadin on hold   code Status: Full code.  Disposition Plan:-Awaiting SNF rehab  Consultants:   Neurology.  Palliative medicine.  Procedures:   None.  Antimicrobials:   Bactrim.   Subjective: Wife at bedside, questions -No fever   -No vomiting or diarrhea   Objective: Vitals:   01/20/20 0604 01/20/20 0924 01/20/20 1100 01/20/20 1500  BP: 131/81   112/67  Pulse: 94   75  Resp: 16   18  Temp: 97.9 F (36.6 C)   97.6 F (36.4 C)  TempSrc: Oral   Oral  SpO2: 95% 94% 93% 94%  Weight:      Height:        Intake/Output Summary (Last 24 hours) at 01/20/2020 1905 Last data filed at 01/20/2020 1300 Gross per 24 hour  Intake 480 ml  Output 400 ml  Net 80 ml   Filed Weights   01/17/20 1341 01/18/20 0021  Weight: 99 kg 100.6 kg    Examination: Physical Exam  Gen:- Awake Alert,  In no apparent distress  HEENT:- [place].AT, No sclera icterus Neck-Supple Neck,No JVD,.  Lungs-  CTAB, no wheezing CV- S1, S2 normal Abd-  +ve B.Sounds, Abd Soft, No tenderness,    Extremity/Skin:- No  edema,   pedal pulses Psych-affect is appropriate, oriented x3 Neuro-generalized weakness, speech deficits/aphasia persist no new focal deficits, no tremors    Data Reviewed:  CBC: Recent Labs  Lab 01/17/20 1503 01/18/20 0636  WBC 16.5* 10.8*  NEUTROABS 13.6* 7.5  HGB 16.1 15.2  HCT 51.0 49.1  48.3  MCV 89.2 89.6  PLT 292 174   Basic Metabolic Panel: Recent Labs  Lab 01/17/20 1503  01/18/20 0636  NA 138 138  K 4.3 4.2  CL 101 101  CO2 26 27  GLUCOSE 123* 85  BUN 19 16  CREATININE 1.07 0.89  CALCIUM 9.1 9.0   GFR: Estimated Creatinine Clearance: 89 mL/min (by C-G formula based on SCr of 0.89 mg/dL). Liver Function Tests: Recent Labs  Lab 01/17/20 1503  AST 29  ALT 26  ALKPHOS 165*  BILITOT 0.6  PROT 7.4  ALBUMIN 3.5   No results for input(s): LIPASE, AMYLASE in the last 168 hours. No results for input(s): AMMONIA in the last 168 hours. Coagulation Profile: Recent Labs  Lab 01/17/20 1503 01/18/20 0636 01/19/20 0646 01/20/20 0435  INR 3.4* 3.7* 3.2* 2.8*   Cardiac Enzymes: Recent Labs  Lab 01/18/20 0636  CKTOTAL 197   BNP (last 3 results) No results for input(s): PROBNP in the last 8760 hours. HbA1C: No results for input(s): HGBA1C in the last 72 hours. CBG: No results for input(s): GLUCAP in the last 168 hours. Lipid Profile: No results for input(s): CHOL, HDL, LDLCALC, TRIG, CHOLHDL, LDLDIRECT in the last 72 hours. Thyroid Function Tests: Recent Labs    01/18/20 0636  TSH 0.525   Anemia Panel: Recent Labs    01/18/20 0636  VITAMINB12 315   Sepsis Labs: No results for input(s): PROCALCITON, LATICACIDVEN in the last 168 hours.  Recent Results (from the past 240 hour(s))  SARS CORONAVIRUS 2 (TAT 6-24 HRS) Nasopharyngeal Nasopharyngeal Swab     Status: None   Collection Time: 01/17/20  5:40 PM   Specimen: Nasopharyngeal Swab  Result Value Ref Range Status   SARS Coronavirus 2 NEGATIVE NEGATIVE Final    Comment: (NOTE) SARS-CoV-2 target nucleic acids are NOT DETECTED. The SARS-CoV-2 RNA is generally detectable in upper and lower respiratory specimens during the acute phase of infection. Negative results do not preclude SARS-CoV-2 infection, do not rule out co-infections with other pathogens, and should not be used as the sole basis for treatment or other patient management decisions. Negative results must be combined with  clinical observations, patient history, and epidemiological information. The expected result is Negative. Fact Sheet for Patients: SugarRoll.be Fact Sheet for Healthcare Providers: https://www.woods-mathews.com/ This test is not yet approved or cleared by the [place] FDA and  has been authorized for detection and/or diagnosis of SARS-CoV-2 by FDA under an Emergency Use Authorization (EUA). This EUA will remain  in effect (meaning this test can be used) for the duration of the COVID-19 declaration under Section 56 4(b)(1) of the Act, 21 U.S.C. section 360bbb-3(b)(1), unless the authorization is terminated or revoked sooner. Performed at [facility], [place] [address], [place], [place] [address]      Radiology Studies: No results found.   Scheduled Meds: . atorvastatin  80 mg Oral Q M,W,F  . finasteride  5 mg Oral Daily  . furosemide  40 mg Oral Daily  . metoprolol tartrate  37.5 mg Oral BID  . mirabegron ER  50 mg Oral QPM  . sertraline  50 mg Oral Daily  . sodium chloride flush  3 mL Intravenous Q12H  . sodium chloride flush  3 mL Intravenous Q12H  . sulfamethoxazole-trimethoprim  1 tablet Oral BID  . tamsulosin  0.8 mg Oral Daily  . umeclidinium bromide  1 puff Inhalation Daily   Continuous Infusions: . sodium chloride    . sodium chloride 50 mL/hr at 01/19/20 1517     LOS: 1 day    Roxan Hockey, MD [facility]   If 7PM-7AM, please contact night-coverage [email or web address]  01/20/2020, 7:05 PM

## 2020-01-20 NOTE — Plan of Care (Signed)

## 2020-01-20 NOTE — Progress Notes (Signed)
Physical Therapy Treatment Patient Details Name: Travis Cruz MRN: [id number] DOB: 1946/06/09 Today's Date: 01/20/2020    History of Present Illness Travis Cruz is a 73 y.o. male with medical history significant for history of DVT and PE on Coumadin, coronary artery disease, history of CVA, vascular dementia, and COPD with chronic hypoxic respiratory failure, now presenting to the emergency department for evaluation of recurrent falls.  Patient is accompanied by his wife who assists with the history.  Patient uses a walker ambulator at home but requires assistance while doing this, mainly to keep his oxygen tubing out of his way.  Over the past 4 months or so, the patient has had more trouble ambulating and has had recurrent falls.  He was attempting to transfer from the toilet by himself tonight when he fell onto his knees and elbows, suffering skin tears to the bilateral elbows.  Patient denies losing consciousness, denies any significant pain from the fall, and neither patient nor family at noticed any new focal neurologic deficits.  Patient's wife explains that they have home health services and family is available to assist the patient, but with his progressive ambulatory difficulties and gradually worsening memory loss, family does not feel that home health has been adequate and had tried to get the patient admitted to a skilled nursing facility, but the site insurance issues and bed availability as a barrier previously.  Patient denies any fevers or chills, denies any recent cough, has continued to use 3 L/min of supplemental oxygen, denies any chest pain or palpitations.  He has been on Bactrim for a small abscess at the left leg that has continued to drain a scant amount of purulent material.    PT Comments    Patient tolerates seated exercise well today with frequent cueing for positioning and mechanics. Orthostatics were assessed today: seated 117/72 HR:77; standing 108/74 HR:90; Standing  after 3 minutes: 113/72 HR:117. Patient able to complete bed mobility with min assist and requires frequent verbal cueing to push rather than reach for therapist to pull himself up and toward EOB. He requires frequent verbal cueing to push off bed and walker to transfer to standing. He is limited to few small steps secondary to impaired activity tolerance and fatigue. Patient ends session seated in chair - RN aware. Patient will benefit from continued physical therapy in hospital and recommended venue below to increase strength, balance, endurance for safe ADLs and gait.     Follow Up Recommendations  SNF     Equipment Recommendations  None recommended by PT    Recommendations for Other Services       Precautions / Restrictions Precautions Precautions: Fall Restrictions Weight Bearing Restrictions: No    Mobility  Bed Mobility Overal bed mobility: Needs Assistance Bed Mobility: Supine to Sit     Supine to sit: Min assist;HOB elevated;Min guard     General bed mobility comments: increased time and tends to want to pull up rather than push self up, will push up for several attempts and then trys to pull again  Transfers Overall transfer level: Needs assistance Equipment used: Rolling walker (2 wheeled) Transfers: Sit to/from [facility] Sit to Stand: Min assist Stand pivot transfers: Min assist       General transfer comment: slow labored unsteady movement  Ambulation/Gait Ambulation/Gait assistance: Min assist Gait Distance (Feet): 3 Feet Assistive device: Rolling walker (2 wheeled) Gait Pattern/deviations: Decreased step length - left;Decreased stride length;Decreased step length - right;Decreased stance time - right Gait velocity:  decreased   General Gait Details: limited to a few small, labored steps to chair   Stairs             Wheelchair Mobility    Modified Rankin (Stroke Patients Only)       Balance Overall balance assessment:  Needs assistance Sitting-balance support: Feet supported;No upper extremity supported Sitting balance-Leahy Scale: Fair Sitting balance - Comments: seated at EOB   Standing balance support: During functional activity;Bilateral upper extremity supported Standing balance-Leahy Scale: Poor Standing balance comment: fair/poor using RW; requires assist for balance and safety                            Cognition Arousal/Alertness: Awake/alert Behavior During Therapy: WFL for tasks assessed/performed Overall Cognitive Status: Within Functional Limits for tasks assessed                                        Exercises General Exercises - Lower Extremity Ankle Circles/Pumps: AROM;Both;15 reps;Seated Long Arc Quad: AROM;Both;15 reps;Seated Toe Raises: AROM;Both;15 reps;Seated Heel Raises: AROM;Both;15 reps;Seated    General Comments        Pertinent Vitals/Pain Pain Assessment: No/denies pain    Home Living                      Prior Function            PT Goals (current goals can now be found in the care plan section) Acute Rehab PT Goals Patient Stated Goal: return home after rehab PT Goal Formulation: With patient Time For Goal Achievement: 02/01/20 Potential to Achieve Goals: Good Progress towards PT goals: Progressing toward goals    Frequency    Min 3X/week      PT Plan Current plan remains appropriate    Co-evaluation              AM-PAC PT "6 Clicks" Mobility   Outcome Measure  Help needed turning from your back to your side while in a flat bed without using bedrails?: None Help needed moving from lying on your back to sitting on the side of a flat bed without using bedrails?: A Little Help needed moving to and from a bed to a chair (including a wheelchair)?: A Lot Help needed standing up from a chair using your arms (e.g., wheelchair or bedside chair)?: A Lot Help needed to walk in hospital room?: A Lot Help  needed climbing 3-5 steps with a railing? : Total 6 Click Score: 14    End of Session Equipment Utilized During Treatment: Gait belt;Oxygen Activity Tolerance: Patient limited by fatigue Patient left: in chair;with call bell/phone within reach;with chair alarm set Nurse Communication: Mobility status PT Visit Diagnosis: Unsteadiness on feet (R26.81);Other abnormalities of gait and mobility (R26.89);Muscle weakness (generalized) (M62.81)     Time: 7322-0254 PT Time Calculation (min) (ACUTE ONLY): 24 min  Charges:  $Therapeutic Exercise: 8-22 mins $Therapeutic Activity: 8-22 mins                     11:54 AM, 01/20/20 Mearl Latin PT, DPT Physical Therapist at [facility]
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
## 2020-01-20 NOTE — Progress Notes (Signed)
Changed patient to BiPAP 14/6 with 4 liters. Patients history states he has severe Nocturnial sleep apnea and wears BiPAP  Of course no settings are available , no sleep study, and he doesn't know settings.

## 2020-01-21 DIAGNOSIS — I5032 Chronic diastolic (congestive) heart failure: Secondary | ICD-10-CM

## 2020-01-21 DIAGNOSIS — R296 Repeated falls: Secondary | ICD-10-CM

## 2020-01-21 DIAGNOSIS — J9611 Chronic respiratory failure with hypoxia: Secondary | ICD-10-CM

## 2020-01-21 DIAGNOSIS — Z86711 Personal history of pulmonary embolism: Secondary | ICD-10-CM

## 2020-01-21 DIAGNOSIS — F0150 Vascular dementia without behavioral disturbance: Secondary | ICD-10-CM

## 2020-01-21 DIAGNOSIS — J449 Chronic obstructive pulmonary disease, unspecified: Secondary | ICD-10-CM

## 2020-01-21 LAB — PROTIME-INR
INR: 1.5 — ABNORMAL HIGH (ref 0.8–1.2)
Prothrombin Time: 17.8 seconds — ABNORMAL HIGH (ref 11.4–15.2)

## 2020-01-21 LAB — BASIC METABOLIC PANEL
Anion gap: 10 (ref 5–15)
BUN: 19 mg/dL (ref 8–23)
CO2: 27 mmol/L (ref 22–32)
Calcium: 8.8 mg/dL — ABNORMAL LOW (ref 8.9–10.3)
Chloride: 98 mmol/L (ref 98–111)
Creatinine, Ser: 1.03 mg/dL (ref 0.61–1.24)
GFR calc Af Amer: >60 mL/min (ref >60)
GFR calc non Af Amer: >60 mL/min (ref >60)
Glucose, Bld: 86 mg/dL (ref 70–99)
Potassium: 4.3 mmol/L (ref 3.5–5.1)
Sodium: 135 mmol/L (ref 135–145)

## 2020-01-21 LAB — MAGNESIUM: Magnesium: 1.8 mg/dL (ref 1.7–2.4)

## 2020-01-21 MED ORDER — LACTATED RINGERS IV SOLN: INTRAVENOUS | Status: AC

## 2020-01-21 NOTE — Progress Notes (Addendum)
PROGRESS NOTE    Travis Cruz  PJA:[id number] DOB: 01-Mar-1946 DOA: 01/17/2020 PCP: Dettinger, Fransisca Kaufmann, MD    Brief Narrative:  74 year old man who has vascular dementia was admitted with frequent falls.  He also has COPD.  Over the last 4 months he has apparently had several falls and he does have home health services.  It appears that the family had become more distressed in being able to cope with his medical problems and they brought him to the emergency room.  He has had progressive ambulation difficulties and gradual worsening of memory.   Assessment & Plan:   Principal Problem:   Recurrent falls Active Problems:   CAD (coronary artery disease)   COPD (chronic obstructive pulmonary disease) (HCC)   History of pulmonary embolus (PE)   History of CVA (cerebrovascular accident)   Chronic diastolic CHF (congestive heart failure) (HCC)   Memory deficit following unspecified cerebrovascular disease   Hypertension   Chronic respiratory failure with hypoxia (HCC)   Abscess of left leg   Vascular dementia without behavioral disturbance [facility])  Brief summary 74 y.o. male with medical history significant for history of DVT and PE on Coumadin, coronary artery disease, history of CVA, vascular dementia, and COPD with chronic hypoxic respiratory failure admitted on 01/17/2020 with recurrent falls  A/p 1.  Recurrent falls/generalized weakness.  This is likely to be multifactorial based on his vascular dementia/history of prior strokes, as well as osteoarthritis--- neurology and physical therapy evaluation appreciated, -CT head without acute findings -No reversible cause for falls and weakness noted --Trial of physical therapy rehab advised Unable to do MRI brain scan due to AICD -Orthostatic vital signs positive.  Will give gentle hydration and recheck in a.m.   2.  History of DVT/pulmonary embolism- -INR is currently subtherapeutic.  Will restart Coumadin  3)  Left leg cellulitis-much  improved, currently on Bactrim which should be completed 1/23.  4.  COPD.  Chronic.  No exacerbation-continue supplemental oxygen and bronchodilators  5. HFpEF/ Chronic diastolic congestive heart failure--- last known EF over 65%, this appears to be compensated.  Continue Lasix and metoprolol  6)Social/Ethics--patient is a full code, elective care consult appreciated, -Plan of care discussed with patient's wife  7)H/o prior stroke/H/o CAD--- asymptomatic at this time continue atorvastatin, Coumadin on hold,, continue metoprolol   DVT prophylaxis: SCDs/Coumadin on hold   code Status: Full code.  Disposition Plan:-Awaiting SNF rehab  Consultants:   Neurology.  Palliative medicine.  Procedures:   None.  Antimicrobials:   Bactrim.   Subjective:  No shortness of breath, vomiting, diarrhea   Objective: Vitals:   01/21/20 0610 01/21/20 0611 01/21/20 1020 01/21/20 1111  BP: 113/69   122/72  Pulse: 91 92  95  Resp: 20     Temp: 98.1 F (36.7 C)     TempSrc: Oral     SpO2: (!) 89% 93% 95%   Weight:      Height:        Intake/Output Summary (Last 24 hours) at 01/21/2020 2026 Last data filed at 01/21/2020 1800 Gross per 24 hour  Intake 600 ml  Output 600 ml  Net 0 ml   Filed Weights   01/17/20 1341 01/18/20 0021  Weight: 99 kg 100.6 kg    Examination: Physical Exam  General exam: Alert, awake, no distress Respiratory system: Clear to auscultation. Respiratory effort normal. Cardiovascular system:RRR. No murmurs, rubs, gallops. Gastrointestinal system: Abdomen is nondistended, soft and nontender. No organomegaly or masses felt. Normal bowel sounds  heard. Central nervous system: No focal neurological deficits. Extremities: No C/C/E, +pedal pulses Skin: No rashes, lesions or ulcers Psychiatry: Confused     Data Reviewed:  CBC: Recent Labs  Lab 01/17/20 1503 01/18/20 0636  WBC 16.5* 10.8*  NEUTROABS 13.6* 7.5  HGB 16.1 15.2  HCT 51.0 49.1  48.3    MCV 89.2 89.6  PLT 292 119   Basic Metabolic Panel: Recent Labs  Lab 01/17/20 1503 01/18/20 0636 01/21/20 0658  NA 138 138 135  K 4.3 4.2 4.3  CL 101 101 98  CO2 26 27 27   GLUCOSE 123* 85 86  BUN 19 16 19   CREATININE 1.07 0.89 1.03  CALCIUM 9.1 9.0 8.8*  MG  --   --  1.8   GFR: Estimated Creatinine Clearance: 76.9 mL/min (by C-G formula based on SCr of 1.03 mg/dL). Liver Function Tests: Recent Labs  Lab 01/17/20 1503  AST 29  ALT 26  ALKPHOS 165*  BILITOT 0.6  PROT 7.4  ALBUMIN 3.5   No results for input(s): LIPASE, AMYLASE in the last 168 hours. No results for input(s): AMMONIA in the last 168 hours. Coagulation Profile: Recent Labs  Lab 01/17/20 1503 01/18/20 0636 01/19/20 0646 01/20/20 0435 01/21/20 0658  INR 3.4* 3.7* 3.2* 2.8* 1.5*   Cardiac Enzymes: Recent Labs  Lab 01/18/20 0636  CKTOTAL 197   BNP (last 3 results) No results for input(s): PROBNP in the last 8760 hours. HbA1C: No results for input(s): HGBA1C in the last 72 hours. CBG: Recent Labs  Lab 01/20/20 2130  GLUCAP 91   Lipid Profile: No results for input(s): CHOL, HDL, LDLCALC, TRIG, CHOLHDL, LDLDIRECT in the last 72 hours. Thyroid Function Tests: No results for input(s): TSH, T4TOTAL, FREET4, T3FREE, THYROIDAB in the last 72 hours. Anemia Panel: No results for input(s): VITAMINB12, FOLATE, FERRITIN, TIBC, IRON, RETICCTPCT in the last 72 hours. Sepsis Labs: No results for input(s): PROCALCITON, LATICACIDVEN in the last 168 hours.  Recent Results (from the past 240 hour(s))  SARS CORONAVIRUS 2 (TAT 6-24 HRS) Nasopharyngeal Nasopharyngeal Swab     Status: None   Collection Time: 01/17/20  5:40 PM   Specimen: Nasopharyngeal Swab  Result Value Ref Range Status   SARS Coronavirus 2 NEGATIVE NEGATIVE Final    Comment: (NOTE) SARS-CoV-2 target nucleic acids are NOT DETECTED. The SARS-CoV-2 RNA is generally detectable in upper and lower respiratory specimens during the acute phase  of infection. Negative results do not preclude SARS-CoV-2 infection, do not rule out co-infections with other pathogens, and should not be used as the sole basis for treatment or other patient management decisions. Negative results must be combined with clinical observations, patient history, and epidemiological information. The expected result is Negative. Fact Sheet for Patients: SugarRoll.be Fact Sheet for Healthcare Providers: https://www.woods-mathews.com/ This test is not yet approved or cleared by the [place] FDA and  has been authorized for detection and/or diagnosis of SARS-CoV-2 by FDA under an Emergency Use Authorization (EUA). This EUA will remain  in effect (meaning this test can be used) for the duration of the COVID-19 declaration under Section 56 4(b)(1) of the Act, 21 U.S.C. section 360bbb-3(b)(1), unless the authorization is terminated or revoked sooner. Performed at [facility], [place] [address], [place], [place] [address]      Radiology Studies: No results found.   Scheduled Meds: . atorvastatin  80 mg Oral Q M,W,F  . finasteride  5 mg Oral Daily  . furosemide  40 mg Oral Daily  .  metoprolol tartrate  37.5 mg Oral BID  . mirabegron ER  50 mg Oral QPM  . sertraline  50 mg Oral Daily  . sodium chloride flush  3 mL Intravenous Q12H  . sodium chloride flush  3 mL Intravenous Q12H  . sulfamethoxazole-trimethoprim  1 tablet Oral BID  . tamsulosin  0.8 mg Oral Daily  . umeclidinium bromide  1 puff Inhalation Daily   Continuous Infusions: . sodium chloride    . lactated ringers       LOS: 2 days    Kathie Dike, MD [facility]   If 7PM-7AM, please contact night-coverage [email or web address]  01/21/2020, 8:26 PM

## 2020-01-22 MED ORDER — WARFARIN - PHARMACIST DOSING INPATIENT: Freq: Every day | Status: DC

## 2020-01-22 MED ORDER — WARFARIN SODIUM 2 MG PO TABS
3.0000 mg | ORAL_TABLET | Freq: Once | ORAL | Status: AC
  Administered 2020-01-22: 3 mg via ORAL
  Filled 2020-01-22: qty 1

## 2020-01-22 MED ORDER — SODIUM CHLORIDE 0.9 % IV SOLN: INTRAVENOUS | Status: AC

## 2020-01-22 NOTE — Progress Notes (Signed)
Pt tried several times during the shift to have a bowel movement with no success, gave prn miralax.

## 2020-01-22 NOTE — Progress Notes (Signed)
ANTICOAGULATION CONSULT NOTE - Initial Up Consult   Pharmacy Consult for warfarin dosing  Indication: VTE prophylaxis   Allergies  Allergen Reactions  . Ativan [Lorazepam] Anxiety and Other (See Comments)    Hyper  [name]      Patient Measurements: Last Weight  Most recent update: 01/18/2020 12:22 AM   Weight  100.6 kg (221 lb 11.2 oz)           Body mass index is 32.74 kg/m. Travis Cruz               Temp: 97.9 F (36.6 C) (01/24 2135) Temp Source: Oral (01/24 2135) BP: 123/67 (01/24 2135) Pulse Rate: 91 (01/24 2135)  Labs: Recent Labs    01/20/20 0435 01/21/20 0658  LABPROT 29.3* 17.8*  INR 2.8* 1.5*  CREATININE  --  1.03    Estimated Creatinine Clearance: 76.9 mL/min (by C-G formula based on SCr of 1.03 mg/dL).     Medications:  Medications Prior to Admission  Medication Sig Dispense Refill Last Dose  . atorvastatin (LIPITOR) 80 MG tablet TAKE 1 TABLET BY MOUTH THREE TIMES PER WEEK (Patient taking differently: Take 80 mg by mouth every Monday, Wednesday, and Friday. ) 36 tablet 1 01/16/2020 at Unknown time  . Cholecalciferol (VITAMIN D3) 2000 units TABS Take 1 tablet by mouth every morning.    01/16/2020 at Unknown time  . finasteride (PROSCAR) 5 MG tablet Take 1 tablet (5 mg total) by mouth daily. 90 tablet 3 01/16/2020 at Unknown time  . furosemide (LASIX) 20 MG tablet TAKE 2 TABLETS (40 MG TOTAL) BY MOUTH DAILY. (NEEDS TO BE SEEN BEFORE NEXT REFILL) (Patient taking differently: Take 40 mg by mouth every morning. ) 60 tablet 0 01/16/2020 at Unknown time  . metoprolol tartrate (LOPRESSOR) 25 MG tablet TAKE 1.5 TABLETS (37.5 MG TOTAL) BY MOUTH 2 (TWO) TIMES DAILY. (NEEDS TO BE SEEN BEFORE NEXT REFILL) 90 tablet 0 01/16/2020 at 1800  . mirabegron ER (MYRBETRIQ) 50 MG TB24 tablet Take 50 mg by mouth every evening.    01/16/2020 at Unknown time  . Multiple Vitamin (MULTIVITAMIN) tablet Take 1 tablet by mouth daily.   01/16/2020 at  Unknown time  . Multiple Vitamins-Minerals (PRESERVISION AREDS PO) Take 1 capsule by mouth 2 (two) times daily.   01/16/2020 at Unknown time  . sertraline (ZOLOFT) 50 MG tablet Take 1 tablet (50 mg total) by mouth daily. 30 tablet 2 01/16/2020 at Unknown time  . SPIRIVA HANDIHALER 18 MCG inhalation capsule PLACE 1 CAPSULE INTO HANDIHALER AND INHALE DAILY (Patient taking differently: Place 18 mcg into inhaler and inhale daily. ) 30 capsule 2 01/16/2020 at Unknown time  . sulfamethoxazole-trimethoprim (BACTRIM DS) 800-160 MG tablet Take 1 tablet by mouth 2 (two) times daily. (Patient taking differently: Take 1 tablet by mouth 2 (two) times daily. 10 day course starting on 01/12/2020) 20 tablet 0 01/16/2020 at Unknown time  . tamsulosin (FLOMAX) 0.4 MG CAPS capsule Take 2 capsules (0.8 mg total) by mouth daily. (Needs to be seen before next refill) (Patient taking differently: Take 0.8 mg by mouth daily. ) 60 capsule 0 01/16/2020 at Unknown time  . warfarin (COUMADIN) 2 MG tablet Take 1 tablet as directed every tues, thurs, sat (Patient taking differently: Take 2 mg by mouth See admin instructions. Take 2mg  by mouth every tues, thurs, sat. (Takes 3mg  on all other days)) 90 tablet 0 01/14/2020 at Unknown time  . warfarin (COUMADIN) 3 MG tablet  TAKE 1 TABLET BY MOUTH DAILY (Patient taking differently: Take 3 mg by mouth See admin instructions. Takes 3mg  on all days except on Tuesdays Thursdays and Saturdays (Takes 2mg  on Tuesdays, Thursdays, and Saturdays)) 90 tablet 1 01/16/2020 at 1800   Scheduled:  . atorvastatin  80 mg Oral Q M,W,F  . finasteride  5 mg Oral Daily  . metoprolol tartrate  37.5 mg Oral BID  . mirabegron ER  50 mg Oral QPM  . sertraline  50 mg Oral Daily  . sodium chloride flush  3 mL Intravenous Q12H  . sodium chloride flush  3 mL Intravenous Q12H  . tamsulosin  0.8 mg Oral Daily  . umeclidinium bromide  1 puff Inhalation Daily  . warfarin  3 mg Oral ONCE-1800  . [START ON 01/23/2020]  Warfarin - Pharmacist Dosing Inpatient   Does not apply q1800   Infusions:  . sodium chloride    . sodium chloride     PRN: sodium chloride, acetaminophen **OR** acetaminophen, HYDROcodone-acetaminophen, ondansetron **OR** ondansetron (ZOFRAN) IV, polyethylene glycol, sodium chloride flush Anti-infectives (From admission, onward)   Start     Dose/Rate Route Frequency Ordered Stop   01/18/20 1000  sulfamethoxazole-trimethoprim (BACTRIM DS) 800-160 MG per tablet 1 tablet    Note to Pharmacy: Patient taking differently: 10 day course starting on 01/12/2020     1 tablet Oral 2 times daily 01/18/20 0000 01/22/20 2200      Goal of Therapy:  INR 2-3 Monitor platelets by anticoagulation protocol: Yes    Prior to Admission Warfarin Dosing:  Travis Cruz takes 3mg  of warfarin on all days except Tuesday Thursday Saturday he takes 2mg .      Admit INR was 3.2, current 1.5  Lab Results  Component Value Date   INR 1.5 (H) 01/21/2020   INR 2.8 (H) 01/20/2020   INR 3.2 (H) 01/19/2020    Assessment: Travis Cruz a 74 y.o. male requires anticoagulation with warfarin for the indication of  VTE prophylaxis. Warfarin will be initiated inpatient following pharmacy protocol per pharmacy consult. Patient most recent blood work is as follows: CBC Latest Ref Rng & Units 01/18/2020 01/18/2020 01/17/2020  WBC 4.0 - 10.5 K/uL 10.8(H) - 16.5(H)  Hemoglobin 13.0 - 17.0 g/dL 15.2 - 16.1  Hematocrit 37.5 - 51.0 % 49.1 48.3 51.0  Platelets 150 - 400 K/uL 256 - 292     Plan: Warfarin 3mg  po once Monitor CBC daily with am labs   Monitor INR daily Monitor for signs and symptoms of bleeding   Donna Christen Keante Urizar, PharmD, MBA, BCGP Clinical Pharmacist

## 2020-01-22 NOTE — Progress Notes (Signed)
PROGRESS NOTE    Travis Cruz  KGM:[id number] DOB: Mar 15, 1946 DOA: 01/17/2020 PCP: Dettinger, Fransisca Kaufmann, MD    Brief Narrative:  74 year old man who has vascular dementia was admitted with frequent falls.  He also has COPD.  Over the last 4 months he has apparently had several falls and he does have home health services.  It appears that the family had become more distressed in being able to cope with his medical problems and they brought him to the emergency room.  He has had progressive ambulation difficulties and gradual worsening of memory.   Assessment & Plan:   Principal Problem:   Recurrent falls Active Problems:   CAD (coronary artery disease)   COPD (chronic obstructive pulmonary disease) (HCC)   History of pulmonary embolus (PE)   History of CVA (cerebrovascular accident)   Chronic diastolic CHF (congestive heart failure) (HCC)   Memory deficit following unspecified cerebrovascular disease   Hypertension   Chronic respiratory failure with hypoxia (HCC)   Abscess of left leg   Vascular dementia without behavioral disturbance [facility])  Brief summary 74 y.o. male with medical history significant for history of DVT and PE on Coumadin, coronary artery disease, history of CVA, vascular dementia, and COPD with chronic hypoxic respiratory failure admitted on 01/17/2020 with recurrent falls  A/p 1.  Recurrent falls/generalized weakness.  This is likely to be multifactorial based on his vascular dementia/history of prior strokes, as well as osteoarthritis--- neurology and physical therapy evaluation appreciated, -CT head without acute findings -No reversible cause for falls and weakness noted --Trial of physical therapy rehab advised Unable to do MRI brain scan due to AICD -Orthostatic vital signs remain positive.  Overall blood pressure does not drop, but heart rate is elevated on standing.  Continue gentle hydration and hold Lasix   2.  History of DVT/pulmonary embolism- -INR is  currently subtherapeutic.  Restarted Coumadin  3)  Left leg cellulitis-much improved, currently on Bactrim which should be completed 1/24.  4.  COPD.  Chronic.  No exacerbation-continue supplemental oxygen and bronchodilators  5. HFpEF/ Chronic diastolic congestive heart failure--- last known EF over 65%, this appears to be compensated.  Continue metoprolol.  Holding Lasix and light of orthostasis  6)Social/Ethics--patient is a full code, palliative care consult appreciated, -Plan of care discussed with patient's wife  7)H/o prior stroke/H/o CAD--- asymptomatic at this time continue atorvastatin, Coumadin on hold,, continue metoprolol   DVT prophylaxis: SCDs/Coumadin   code Status: Full code.  Disposition Plan:-Awaiting SNF rehab  Consultants:   Neurology.  Palliative medicine.  Procedures:   None.  Antimicrobials:   Bactrim.   Subjective:  Feels that breathing is little more short today.  No vomiting.  No chest pain   Objective: Vitals:   01/21/20 2230 01/22/20 0417 01/22/20 0836 01/22/20 1441  BP:  122/76  116/64  Pulse: 83 78  69  Resp: 20 18  16   Temp:  97.7 F (36.5 C)  (!) 97.5 F (36.4 C)  TempSrc:  Oral  Oral  SpO2: 93% 94% (!) 85% 95%  Weight:      Height:        Intake/Output Summary (Last 24 hours) at 01/22/2020 2025 Last data filed at 01/22/2020 0507 Gross per 24 hour  Intake 440 ml  Output --  Net 440 ml   Filed Weights   01/17/20 1341 01/18/20 0021  Weight: 99 kg 100.6 kg    Examination: Physical Exam  General exam: Alert, awake, no distress Respiratory system: Crackles  at bases. Respiratory effort normal. Cardiovascular system:RRR. No murmurs, rubs, gallops. Gastrointestinal system: Abdomen is nondistended, soft and nontender. No organomegaly or masses felt. Normal bowel sounds heard. Central nervous system: No focal neurological deficits. Extremities: No C/C/E, +pedal pulses Skin: No rashes, lesions or ulcers Psychiatry:  Confused   Data Reviewed:  CBC: Recent Labs  Lab 01/17/20 1503 01/18/20 0636  WBC 16.5* 10.8*  NEUTROABS 13.6* 7.5  HGB 16.1 15.2  HCT 51.0 49.1  48.3  MCV 89.2 89.6  PLT 292 831   Basic Metabolic Panel: Recent Labs  Lab 01/17/20 1503 01/18/20 0636 01/21/20 0658  NA 138 138 135  K 4.3 4.2 4.3  CL 101 101 98  CO2 26 27 27   GLUCOSE 123* 85 86  BUN 19 16 19   CREATININE 1.07 0.89 1.03  CALCIUM 9.1 9.0 8.8*  MG  --   --  1.8   GFR: Estimated Creatinine Clearance: 76.9 mL/min (by C-G formula based on SCr of 1.03 mg/dL). Liver Function Tests: Recent Labs  Lab 01/17/20 1503  AST 29  ALT 26  ALKPHOS 165*  BILITOT 0.6  PROT 7.4  ALBUMIN 3.5   No results for input(s): LIPASE, AMYLASE in the last 168 hours. No results for input(s): AMMONIA in the last 168 hours. Coagulation Profile: Recent Labs  Lab 01/17/20 1503 01/18/20 0636 01/19/20 0646 01/20/20 0435 01/21/20 0658  INR 3.4* 3.7* 3.2* 2.8* 1.5*   Cardiac Enzymes: Recent Labs  Lab 01/18/20 0636  CKTOTAL 197   BNP (last 3 results) No results for input(s): PROBNP in the last 8760 hours. HbA1C: No results for input(s): HGBA1C in the last 72 hours. CBG: Recent Labs  Lab 01/20/20 2130  GLUCAP 91   Lipid Profile: No results for input(s): CHOL, HDL, LDLCALC, TRIG, CHOLHDL, LDLDIRECT in the last 72 hours. Thyroid Function Tests: No results for input(s): TSH, T4TOTAL, FREET4, T3FREE, THYROIDAB in the last 72 hours. Anemia Panel: No results for input(s): VITAMINB12, FOLATE, FERRITIN, TIBC, IRON, RETICCTPCT in the last 72 hours. Sepsis Labs: No results for input(s): PROCALCITON, LATICACIDVEN in the last 168 hours.  Recent Results (from the past 240 hour(s))  SARS CORONAVIRUS 2 (TAT 6-24 HRS) Nasopharyngeal Nasopharyngeal Swab     Status: None   Collection Time: 01/17/20  5:40 PM   Specimen: Nasopharyngeal Swab  Result Value Ref Range Status   SARS Coronavirus 2 NEGATIVE NEGATIVE Final    Comment:  (NOTE) SARS-CoV-2 target nucleic acids are NOT DETECTED. The SARS-CoV-2 RNA is generally detectable in upper and lower respiratory specimens during the acute phase of infection. Negative results do not preclude SARS-CoV-2 infection, do not rule out co-infections with other pathogens, and should not be used as the sole basis for treatment or other patient management decisions. Negative results must be combined with clinical observations, patient history, and epidemiological information. The expected result is Negative. Fact Sheet for Patients: SugarRoll.be Fact Sheet for Healthcare Providers: https://www.woods-mathews.com/ This test is not yet approved or cleared by the [place] FDA and  has been authorized for detection and/or diagnosis of SARS-CoV-2 by FDA under an Emergency Use Authorization (EUA). This EUA will remain  in effect (meaning this test can be used) for the duration of the COVID-19 declaration under Section 56 4(b)(1) of the Act, 21 U.S.C. section 360bbb-3(b)(1), unless the authorization is terminated or revoked sooner. Performed at [facility], [place] [address], [place], [place] [address]      Radiology Studies: No results found.   Scheduled Meds: .  atorvastatin  80 mg Oral Q M,W,F  . finasteride  5 mg Oral Daily  . furosemide  40 mg Oral Daily  . metoprolol tartrate  37.5 mg Oral BID  . mirabegron ER  50 mg Oral QPM  . sertraline  50 mg Oral Daily  . sodium chloride flush  3 mL Intravenous Q12H  . sodium chloride flush  3 mL Intravenous Q12H  . sulfamethoxazole-trimethoprim  1 tablet Oral BID  . tamsulosin  0.8 mg Oral Daily  . umeclidinium bromide  1 puff Inhalation Daily   Continuous Infusions: . sodium chloride       LOS: 3 days    Kathie Dike, MD [facility]   If 7PM-7AM, please contact night-coverage [email or web address]  01/22/2020, 8:25 PM

## 2020-01-23 LAB — CBC
HCT: 48.8 % (ref 39.0–52.0)
Hemoglobin: 15.3 g/dL (ref 13.0–17.0)
MCH: 28.1 pg (ref 26.0–34.0)
MCHC: 31.4 g/dL (ref 30.0–36.0)
MCV: 89.7 fL (ref 80.0–100.0)
Platelets: 288 10*3/uL (ref 150–400)
RBC: 5.44 MIL/uL (ref 4.22–5.81)
RDW: 19 % — ABNORMAL HIGH (ref 11.5–15.5)
WBC: 11.7 10*3/uL — ABNORMAL HIGH (ref 4.0–10.5)
nRBC: 0 % (ref 0.0–0.2)

## 2020-01-23 LAB — BASIC METABOLIC PANEL
Anion gap: 9 (ref 5–15)
BUN: 16 mg/dL (ref 8–23)
CO2: 29 mmol/L (ref 22–32)
Calcium: 9.1 mg/dL (ref 8.9–10.3)
Chloride: 99 mmol/L (ref 98–111)
Creatinine, Ser: 0.97 mg/dL (ref 0.61–1.24)
GFR calc Af Amer: >60 mL/min (ref >60)
GFR calc non Af Amer: >60 mL/min (ref >60)
Glucose, Bld: 95 mg/dL (ref 70–99)
Potassium: 4.3 mmol/L (ref 3.5–5.1)
Sodium: 137 mmol/L (ref 135–145)

## 2020-01-23 LAB — PROTIME-INR
INR: 1.2 (ref 0.8–1.2)
Prothrombin Time: 15.3 seconds — ABNORMAL HIGH (ref 11.4–15.2)

## 2020-01-23 MED ORDER — WARFARIN SODIUM 5 MG PO TABS
5.0000 mg | ORAL_TABLET | Freq: Once | ORAL | Status: AC
  Administered 2020-01-23: 5 mg via ORAL
  Filled 2020-01-23: qty 1

## 2020-01-23 NOTE — TOC Progression Note (Addendum)
Transition of Care [facility]) - Progression Note    Patient Details  Name: Travis Cruz MRN: [id number] Date of Birth: 1946-09-02  Transition of Care [facility]) CM/SW Contact  Shade Flood, LCSW Phone Number: 01/23/2020, 4:14 PM  Clinical Narrative:      TOC following. Pt does not have any SNF bed offers. TOC CMA contacted pending facilities to request update. [place], Ebony Hail, is requesting updated PT notes and states she will consider pt once those are entered.   Updated PT of this request. Pt will also need insurance authorization before being able to transfer. Covering TOC will follow up tomorrow.       Expected Discharge Plan and Services                                                 Social Determinants of Health (SDOH) Interventions    Readmission Risk Interventions No flowsheet data found.

## 2020-01-23 NOTE — Progress Notes (Signed)
Physical Therapy Treatment Patient Details Name: Travis Cruz MRN: [id number] DOB: 1946-10-22 Today's Date: 01/23/2020    History of Present Illness Travis Cruz is a 74 y.o. male with medical history significant for history of DVT and PE on Coumadin, coronary artery disease, history of CVA, vascular dementia, and COPD with chronic hypoxic respiratory failure, now presenting to the emergency department for evaluation of recurrent falls.  Patient is accompanied by his wife who assists with the history.  Patient uses a walker ambulator at home but requires assistance while doing this, mainly to keep his oxygen tubing out of his way.  Over the past 4 months or so, the patient has had more trouble ambulating and has had recurrent falls.  He was attempting to transfer from the toilet by himself tonight when he fell onto his knees and elbows, suffering skin tears to the bilateral elbows.  Patient denies losing consciousness, denies any significant pain from the fall, and neither patient nor family at noticed any new focal neurologic deficits.  Patient's wife explains that they have home health services and family is available to assist the patient, but with his progressive ambulatory difficulties and gradually worsening memory loss, family does not feel that home health has been adequate and had tried to get the patient admitted to a skilled nursing facility, but the site insurance issues and bed availability as a barrier previously.  Patient denies any fevers or chills, denies any recent cough, has continued to use 3 L/min of supplemental oxygen, denies any chest pain or palpitations.  He has been on Bactrim for a small abscess at the left leg that has continued to drain a scant amount of purulent material.    PT Comments    Pt agreeable to therapy today.  Max cues for general steps/sequencing.  Pt transferring to wrong side of bed and had to correct to go to opposite side of bed.  Pt dependent on  rails/pulling self up rather than pushing.  General safety issues with transfers and mobility.  Pt increased ambulation distance to 30 feet with some instability upon initally standing but no others during ambulation.  Pt with general fatigue after 30 feet and also voided on self as condom cath had come loose.  Nursing called to fix.  Pt required max assist to clean self and change socks/gown.  Pt returned supine.     Follow Up Recommendations  SNF     Equipment Recommendations  None recommended by PT    Recommendations for Other Services       Precautions / Restrictions Precautions Precautions: Fall Restrictions Weight Bearing Restrictions: No    Mobility  Bed Mobility Overal bed mobility: Needs Assistance Bed Mobility: Supine to Sit     Supine to sit: Min guard     General bed mobility comments: increased time and tends to want to pull up rather than push self up, will push up for several attempts and then trys to pull again.  Pt with general cues on how to get to side of bed  Transfers Overall transfer level: Needs assistance Equipment used: Rolling walker (2 wheeled) Transfers: Sit to/from [facility] Sit to Stand: Min assist         General transfer comment: slow movement, cues for hand placement and navigation  Ambulation/Gait Ambulation/Gait assistance: Min assist Gait Distance (Feet): 30 Feet Assistive device: Rolling walker (2 wheeled) Gait Pattern/deviations: Decreased step length - left;Decreased stride length;Decreased step length - right;Decreased stance time - right Gait velocity:  decreased   General Gait Details: improved gait tolerance today but generally unsteady   Stairs             Wheelchair Mobility    Modified Rankin (Stroke Patients Only)       Balance                                            Cognition Arousal/Alertness: Awake/alert Behavior During Therapy: WFL for tasks  assessed/performed Overall Cognitive Status: Within Functional Limits for tasks assessed                                        Exercises      General Comments        Pertinent Vitals/Pain      Home Living                      Prior Function            PT Goals (current goals can now be found in the care plan section) Acute Rehab PT Goals PT Goal Formulation: With patient Progress towards PT goals: Progressing toward goals    Frequency    Min 3X/week      PT Plan Current plan remains appropriate    Co-evaluation              AM-PAC PT "6 Clicks" Mobility   Outcome Measure  Help needed turning from your back to your side while in a flat bed without using bedrails?: None Help needed moving from lying on your back to sitting on the side of a flat bed without using bedrails?: A Little Help needed moving to and from a bed to a chair (including a wheelchair)?: A Lot Help needed standing up from a chair using your arms (e.g., wheelchair or bedside chair)?: A Little Help needed to walk in hospital room?: A Lot Help needed climbing 3-5 steps with a railing? : Total 6 Click Score: 15    End of Session Equipment Utilized During Treatment: Gait belt;Oxygen Activity Tolerance: Patient tolerated treatment well Patient left: in bed;with call bell/phone within reach;with bed alarm set Nurse Communication: Mobility status PT Visit Diagnosis: Unsteadiness on feet (R26.81);Other abnormalities of gait and mobility (R26.89);Muscle weakness (generalized) (M62.81)     Time: 0347-4259 PT Time Calculation (min) (ACUTE ONLY): 35 min  Charges:  $Gait Training: 8-22 mins $Therapeutic Activity: 8-22 mins                    Teena Irani, PTA/CLT [place], Sricharan [place] B 01/23/2020, 5:21 PM

## 2020-01-23 NOTE — Progress Notes (Signed)
ANTICOAGULATION CONSULT NOTE -    Pharmacy Consult for warfarin dosing  Indication: VTE prophylaxis   Allergies  Allergen Reactions  . Ativan [Lorazepam] Anxiety and Other (See Comments)    Hyper  [name]      Patient Measurements: Last Weight  Most recent update: 01/18/2020 12:22 AM   Weight  100.6 kg (221 lb 11.2 oz)           Body mass index is 32.74 kg/m. Travis Cruz               Temp: 97.6 F (36.4 C) (01/25 0420) Temp Source: Oral (01/25 0420) BP: 115/66 (01/25 0420) Pulse Rate: 83 (01/25 0420)  Labs: Recent Labs    01/21/20 0658 01/23/20 0533  HGB  --  15.3  HCT  --  48.8  PLT  --  288  LABPROT 17.8* 15.3*  INR 1.5* 1.2  CREATININE 1.03 0.97    Estimated Creatinine Clearance: 81.7 mL/min (by C-G formula based on SCr of 0.97 mg/dL).     Medications:  Medications Prior to Admission  Medication Sig Dispense Refill Last Dose  . atorvastatin (LIPITOR) 80 MG tablet TAKE 1 TABLET BY MOUTH THREE TIMES PER WEEK (Patient taking differently: Take 80 mg by mouth every Monday, Wednesday, and Friday. ) 36 tablet 1 01/16/2020 at Unknown time  . Cholecalciferol (VITAMIN D3) 2000 units TABS Take 1 tablet by mouth every morning.    01/16/2020 at Unknown time  . finasteride (PROSCAR) 5 MG tablet Take 1 tablet (5 mg total) by mouth daily. 90 tablet 3 01/16/2020 at Unknown time  . furosemide (LASIX) 20 MG tablet TAKE 2 TABLETS (40 MG TOTAL) BY MOUTH DAILY. (NEEDS TO BE SEEN BEFORE NEXT REFILL) (Patient taking differently: Take 40 mg by mouth every morning. ) 60 tablet 0 01/16/2020 at Unknown time  . metoprolol tartrate (LOPRESSOR) 25 MG tablet TAKE 1.5 TABLETS (37.5 MG TOTAL) BY MOUTH 2 (TWO) TIMES DAILY. (NEEDS TO BE SEEN BEFORE NEXT REFILL) 90 tablet 0 01/16/2020 at 1800  . mirabegron ER (MYRBETRIQ) 50 MG TB24 tablet Take 50 mg by mouth every evening.    01/16/2020 at Unknown time  . Multiple Vitamin (MULTIVITAMIN) tablet Take 1 tablet by  mouth daily.   01/16/2020 at Unknown time  . Multiple Vitamins-Minerals (PRESERVISION AREDS PO) Take 1 capsule by mouth 2 (two) times daily.   01/16/2020 at Unknown time  . sertraline (ZOLOFT) 50 MG tablet Take 1 tablet (50 mg total) by mouth daily. 30 tablet 2 01/16/2020 at Unknown time  . SPIRIVA HANDIHALER 18 MCG inhalation capsule PLACE 1 CAPSULE INTO HANDIHALER AND INHALE DAILY (Patient taking differently: Place 18 mcg into inhaler and inhale daily. ) 30 capsule 2 01/16/2020 at Unknown time  . sulfamethoxazole-trimethoprim (BACTRIM DS) 800-160 MG tablet Take 1 tablet by mouth 2 (two) times daily. (Patient taking differently: Take 1 tablet by mouth 2 (two) times daily. 10 day course starting on 01/12/2020) 20 tablet 0 01/16/2020 at Unknown time  . tamsulosin (FLOMAX) 0.4 MG CAPS capsule Take 2 capsules (0.8 mg total) by mouth daily. (Needs to be seen before next refill) (Patient taking differently: Take 0.8 mg by mouth daily. ) 60 capsule 0 01/16/2020 at Unknown time  . warfarin (COUMADIN) 2 MG tablet Take 1 tablet as directed every tues, thurs, sat (Patient taking differently: Take 2 mg by mouth See admin instructions. Take 2mg  by mouth every tues, thurs, sat. (Takes 3mg  on all other days))  90 tablet 0 01/14/2020 at Unknown time  . warfarin (COUMADIN) 3 MG tablet TAKE 1 TABLET BY MOUTH DAILY (Patient taking differently: Take 3 mg by mouth See admin instructions. Takes 3mg  on all days except on Tuesdays Thursdays and Saturdays (Takes 2mg  on Tuesdays, Thursdays, and Saturdays)) 90 tablet 1 01/16/2020 at 1800   Scheduled:  . atorvastatin  80 mg Oral Q M,W,F  . finasteride  5 mg Oral Daily  . metoprolol tartrate  37.5 mg Oral BID  . mirabegron ER  50 mg Oral QPM  . sertraline  50 mg Oral Daily  . sodium chloride flush  3 mL Intravenous Q12H  . sodium chloride flush  3 mL Intravenous Q12H  . tamsulosin  0.8 mg Oral Daily  . umeclidinium bromide  1 puff Inhalation Daily  . Warfarin - Pharmacist Dosing  Inpatient   Does not apply q1800   Infusions:  . sodium chloride     PRN: sodium chloride, acetaminophen **OR** acetaminophen, HYDROcodone-acetaminophen, ondansetron **OR** ondansetron (ZOFRAN) IV, polyethylene glycol, sodium chloride flush Anti-infectives (From admission, onward)   Start     Dose/Rate Route Frequency Ordered Stop   01/18/20 1000  sulfamethoxazole-trimethoprim (BACTRIM DS) 800-160 MG per tablet 1 tablet    Note to Pharmacy: Patient taking differently: 10 day course starting on 01/12/2020     1 tablet Oral 2 times daily 01/18/20 0000 01/22/20 2200      Goal of Therapy:  INR 2-3 Monitor platelets by anticoagulation protocol: Yes    Prior to Admission Warfarin Dosing:  Travis Cruz takes 3mg  of warfarin on all days except Tuesday Thursday Saturday he takes 2mg .      Admit INR  3.2>> 1.5>> 1.2  Lab Results  Component Value Date   INR 1.2 01/23/2020   INR 1.5 (H) 01/21/2020   INR 2.8 (H) 01/20/2020    Assessment: Travis Cruz a 74 y.o. male requires anticoagulation with warfarin for the indication of  VTE prophylaxis. Patient has a history of PE and CVA. Patient has frequent falls. Coumadin on hold for a couple of days and restarted 1/24.  Warfarin will be initiated inpatient following pharmacy protocol per pharmacy consult. Patient most recent blood work is as follows: CBC Latest Ref Rng & Units 01/23/2020 01/18/2020 01/18/2020  WBC 4.0 - 10.5 K/uL 11.7(H) 10.8(H) -  Hemoglobin 13.0 - 17.0 g/dL 15.3 15.2 -  Hematocrit 39.0 - 52.0 % 48.8 49.1 48.3  Platelets 150 - 400 K/uL 288 256 -     Plan: Warfarin 5mg  po x 1 Monitor CBC daily with am labs   Monitor INR daily Monitor for signs and symptoms of bleeding   Travis Cruz, BS [place], BCPS Clinical Pharmacist Pager [phone]

## 2020-01-23 NOTE — Progress Notes (Signed)
PROGRESS NOTE    Travis Cruz  KTG:[id number] DOB: 03-31-1946 DOA: 01/17/2020 PCP: Dettinger, Fransisca Kaufmann, MD    Brief Narrative:  74 year old man who has vascular dementia was admitted with frequent falls.  He also has COPD.  Over the last 4 months he has apparently had several falls and he does have home health services.  It appears that the family had become more distressed in being able to cope with his medical problems and they brought him to the emergency room.  He has had progressive ambulation difficulties and gradual worsening of memory.   Assessment & Plan:   Principal Problem:   Recurrent falls Active Problems:   CAD (coronary artery disease)   COPD (chronic obstructive pulmonary disease) (HCC)   History of pulmonary embolus (PE)   History of CVA (cerebrovascular accident)   Chronic diastolic CHF (congestive heart failure) (HCC)   Memory deficit following unspecified cerebrovascular disease   Hypertension   Chronic respiratory failure with hypoxia (HCC)   Abscess of left leg   Vascular dementia without behavioral disturbance [facility])  Brief summary 74 y.o. male with medical history significant for history of DVT and PE on Coumadin, coronary artery disease, history of CVA, vascular dementia, and COPD with chronic hypoxic respiratory failure admitted on 01/17/2020 with recurrent falls -Awaiting transfer to SNF rehab  A/p 1.  Recurrent falls/generalized weakness.  This is likely to be multifactorial based on his vascular dementia/history of prior strokes, as well as osteoarthritis--- neurology and physical therapy evaluation appreciated, -CT head without acute findings -No reversible cause for falls and weakness noted --Trial of physical therapy rehab advised Unable to do MRI brain scan due to AICD -Orthostatic vital signs remain positive.  Overall blood pressure does not drop, but heart rate is elevated on standing.  Continue gentle hydration and hold Lasix   2.  History of  DVT/pulmonary embolism- -INR is currently subtherapeutic.  Coumadin as ordered  3)  Left leg cellulitis-much improved, currently on Bactrim which should be completed 1/24.  4.  COPD.  Chronic.  No exacerbation-continue supplemental oxygen and bronchodilators  5. HFpEF/ Chronic diastolic congestive heart failure--- last known EF over 65%, this appears to be compensated.  Continue metoprolol.  Holding Lasix and light of orthostasis  6)Social/Ethics--patient is a full code, palliative care consult appreciated, -Plan of care discussed with patient's wife  7)H/o prior stroke/H/o CAD--- asymptomatic at this time continue atorvastatin, Coumadin on hold,, continue metoprolol   DVT prophylaxis: SCDs/Coumadin   code Status: Full code.  Disposition Plan:-Awaiting SNF rehab  Consultants:   Neurology.  Palliative medicine.  Procedures:   None.  Antimicrobials:   Bactrim.   Subjective:  Able to get up with assistance from bed to chair -More coherent -Oral intake is fair  Objective: Vitals:   01/22/20 2253 01/23/20 0420 01/23/20 0808 01/23/20 1444  BP:  115/66  115/82  Pulse: 96 83  80  Resp: 16 18  18   Temp:  97.6 F (36.4 C)  98 F (36.7 C)  TempSrc:  Oral  Oral  SpO2: 90% 92% 92% 93%  Weight:      Height:        Intake/Output Summary (Last 24 hours) at 01/23/2020 1933 Last data filed at 01/23/2020 1700 Gross per 24 hour  Intake 984.11 ml  Output 1300 ml  Net -315.89 ml   Filed Weights   01/17/20 1341 01/18/20 0021  Weight: 99 kg 100.6 kg    Examination: Physical Exam  General exam: Alert, awake,  no distress Respiratory system: Crackles at bases. Respiratory effort normal. Cardiovascular system:RRR. No murmurs, rubs, gallops. Gastrointestinal system: Abdomen is nondistended, soft and nontender. No organomegaly or masses felt. Normal bowel sounds heard. Central nervous system: No focal neurological deficits. Extremities: No C/C/E, +pedal pulses Skin: No  rashes, lesions or ulcers Psychiatry: Confused   Data Reviewed:  CBC: Recent Labs  Lab 01/17/20 1503 01/18/20 0636 01/23/20 0533  WBC 16.5* 10.8* 11.7*  NEUTROABS 13.6* 7.5  --   HGB 16.1 15.2 15.3  HCT 51.0 49.1  48.3 48.8  MCV 89.2 89.6 89.7  PLT 292 256 287   Basic Metabolic Panel: Recent Labs  Lab 01/17/20 1503 01/18/20 0636 01/21/20 0658 01/23/20 0533  NA 138 138 135 137  K 4.3 4.2 4.3 4.3  CL 101 101 98 99  CO2 26 27 27 29   GLUCOSE 123* 85 86 95  BUN 19 16 19 16   CREATININE 1.07 0.89 1.03 0.97  CALCIUM 9.1 9.0 8.8* 9.1  MG  --   --  1.8  --    GFR: Estimated Creatinine Clearance: 81.7 mL/min (by C-G formula based on SCr of 0.97 mg/dL). Liver Function Tests: Recent Labs  Lab 01/17/20 1503  AST 29  ALT 26  ALKPHOS 165*  BILITOT 0.6  PROT 7.4  ALBUMIN 3.5   No results for input(s): LIPASE, AMYLASE in the last 168 hours. No results for input(s): AMMONIA in the last 168 hours. Coagulation Profile: Recent Labs  Lab 01/18/20 0636 01/19/20 0646 01/20/20 0435 01/21/20 0658 01/23/20 0533  INR 3.7* 3.2* 2.8* 1.5* 1.2   Cardiac Enzymes: Recent Labs  Lab 01/18/20 0636  CKTOTAL 197   BNP (last 3 results) No results for input(s): PROBNP in the last 8760 hours. HbA1C: No results for input(s): HGBA1C in the last 72 hours. CBG: Recent Labs  Lab 01/20/20 2130  GLUCAP 91   Lipid Profile: No results for input(s): CHOL, HDL, LDLCALC, TRIG, CHOLHDL, LDLDIRECT in the last 72 hours. Thyroid Function Tests: No results for input(s): TSH, T4TOTAL, FREET4, T3FREE, THYROIDAB in the last 72 hours. Anemia Panel: No results for input(s): VITAMINB12, FOLATE, FERRITIN, TIBC, IRON, RETICCTPCT in the last 72 hours. Sepsis Labs: No results for input(s): PROCALCITON, LATICACIDVEN in the last 168 hours.  Recent Results (from the past 240 hour(s))  SARS CORONAVIRUS 2 (TAT 6-24 HRS) Nasopharyngeal Nasopharyngeal Swab     Status: None   Collection Time: 01/17/20   5:40 PM   Specimen: Nasopharyngeal Swab  Result Value Ref Range Status   SARS Coronavirus 2 NEGATIVE NEGATIVE Final    Comment: (NOTE) SARS-CoV-2 target nucleic acids are NOT DETECTED. The SARS-CoV-2 RNA is generally detectable in upper and lower respiratory specimens during the acute phase of infection. Negative results do not preclude SARS-CoV-2 infection, do not rule out co-infections with other pathogens, and should not be used as the sole basis for treatment or other patient management decisions. Negative results must be combined with clinical observations, patient history, and epidemiological information. The expected result is Negative. Fact Sheet for Patients: SugarRoll.be Fact Sheet for Healthcare Providers: https://www.woods-mathews.com/ This test is not yet approved or cleared by the [place] FDA and  has been authorized for detection and/or diagnosis of SARS-CoV-2 by FDA under an Emergency Use Authorization (EUA). This EUA will remain  in effect (meaning this test can be used) for the duration of the COVID-19 declaration under Section 56 4(b)(1) of the Act, 21 U.S.C. section 360bbb-3(b)(1), unless the authorization is terminated or revoked sooner.  Performed at [facility], [place] [address] [place], [place] [address]      Radiology Studies: No results found.   Scheduled Meds: . atorvastatin  80 mg Oral Q M,W,F  . finasteride  5 mg Oral Daily  . metoprolol tartrate  37.5 mg Oral BID  . mirabegron ER  50 mg Oral QPM  . sertraline  50 mg Oral Daily  . sodium chloride flush  3 mL Intravenous Q12H  . tamsulosin  0.8 mg Oral Daily  . umeclidinium bromide  1 puff Inhalation Daily  . Warfarin - Pharmacist Dosing Inpatient   Does not apply q1800   Continuous Infusions: . sodium chloride       LOS: 4 days    Roxan Hockey, MD [facility]   If 7PM-7AM, please contact  night-coverage [email or web address]  01/23/2020, 7:33 PM

## 2020-01-23 NOTE — Plan of Care (Signed)

## 2020-01-24 DIAGNOSIS — I5023 Acute on chronic systolic (congestive) heart failure: Secondary | ICD-10-CM

## 2020-01-24 LAB — CBC
HCT: 48.6 % (ref 39.0–52.0)
Hemoglobin: 15.3 g/dL (ref 13.0–17.0)
MCH: 28.2 pg (ref 26.0–34.0)
MCHC: 31.5 g/dL (ref 30.0–36.0)
MCV: 89.5 fL (ref 80.0–100.0)
Platelets: 279 10*3/uL (ref 150–400)
RBC: 5.43 MIL/uL (ref 4.22–5.81)
RDW: 18.8 % — ABNORMAL HIGH (ref 11.5–15.5)
WBC: 12.1 10*3/uL — ABNORMAL HIGH (ref 4.0–10.5)
nRBC: 0 % (ref 0.0–0.2)

## 2020-01-24 LAB — PROTIME-INR
INR: 1.5 — ABNORMAL HIGH (ref 0.8–1.2)
Prothrombin Time: 18.4 seconds — ABNORMAL HIGH (ref 11.4–15.2)

## 2020-01-24 MED ORDER — WARFARIN SODIUM 3 MG PO TABS: 3.0000 mg | ORAL_TABLET | ORAL | 1 refills | Status: DC

## 2020-01-24 MED ORDER — TAMSULOSIN HCL 0.4 MG PO CAPS: 0.4000 mg | ORAL_CAPSULE | Freq: Every day | ORAL | 3 refills | Status: DC

## 2020-01-24 MED ORDER — MIRABEGRON ER 50 MG PO TB24: 50.0000 mg | ORAL_TABLET | Freq: Every evening | ORAL | 2 refills | Status: DC

## 2020-01-24 MED ORDER — FINASTERIDE 5 MG PO TABS: 5.0000 mg | ORAL_TABLET | Freq: Every day | ORAL | 3 refills | Status: DC

## 2020-01-24 MED ORDER — FUROSEMIDE 20 MG PO TABS: 20.0000 mg | ORAL_TABLET | Freq: Every morning | ORAL | 2 refills | Status: DC

## 2020-01-24 MED ORDER — WARFARIN SODIUM 2 MG PO TABS: 2.0000 mg | ORAL_TABLET | ORAL | 2 refills | Status: DC

## 2020-01-24 MED ORDER — SERTRALINE HCL 50 MG PO TABS: 50.0000 mg | ORAL_TABLET | Freq: Every day | ORAL | 2 refills | Status: DC

## 2020-01-24 MED ORDER — METOPROLOL TARTRATE 25 MG PO TABS: 37.5000 mg | ORAL_TABLET | Freq: Two times a day (BID) | ORAL | 2 refills | Status: DC

## 2020-01-24 MED ORDER — ONDANSETRON HCL 4 MG PO TABS: 4.0000 mg | ORAL_TABLET | Freq: Four times a day (QID) | ORAL | 0 refills | Status: DC | PRN

## 2020-01-24 MED ORDER — WARFARIN SODIUM 2 MG PO TABS
3.0000 mg | ORAL_TABLET | Freq: Once | ORAL | Status: AC
  Administered 2020-01-24: 3 mg via ORAL
  Filled 2020-01-24: qty 1

## 2020-01-24 MED ORDER — ATORVASTATIN CALCIUM 80 MG PO TABS: 80.0000 mg | ORAL_TABLET | ORAL | 2 refills | Status: DC

## 2020-01-24 MED ORDER — ACETAMINOPHEN 325 MG PO TABS: 650.0000 mg | ORAL_TABLET | Freq: Four times a day (QID) | ORAL | 9 refills | Status: AC | PRN

## 2020-01-24 MED ORDER — SPIRIVA HANDIHALER 18 MCG IN CAPS: 18.0000 ug | ORAL_CAPSULE | Freq: Every day | RESPIRATORY_TRACT | 4 refills | Status: DC

## 2020-01-24 NOTE — Progress Notes (Signed)
ANTICOAGULATION CONSULT NOTE -    Pharmacy Consult for warfarin dosing  Indication: VTE prophylaxis   Allergies  Allergen Reactions  . Ativan [Lorazepam] Anxiety and Other (See Comments)    Hyper  [name]      Patient Measurements: Last Weight  Most recent update: 01/18/2020 12:22 AM   Weight  100.6 kg (221 lb 11.2 oz)           Body mass index is 32.74 kg/m. Travis Cruz               Temp: 97.5 F (36.4 C) (01/26 0535) Temp Source: Oral (01/26 0535) BP: 123/61 (01/26 0535) Pulse Rate: 86 (01/26 0535)  Labs: Recent Labs    01/23/20 0533 01/24/20 0518  HGB 15.3 15.3  HCT 48.8 48.6  PLT 288 279  LABPROT 15.3* 18.4*  INR 1.2 1.5*  CREATININE 0.97  --     Estimated Creatinine Clearance: 81.7 mL/min (by C-G formula based on SCr of 0.97 mg/dL).     Medications:  Medications Prior to Admission  Medication Sig Dispense Refill Last Dose  . atorvastatin (LIPITOR) 80 MG tablet TAKE 1 TABLET BY MOUTH THREE TIMES PER WEEK (Patient taking differently: Take 80 mg by mouth every Monday, Wednesday, and Friday. ) 36 tablet 1 01/16/2020 at Unknown time  . Cholecalciferol (VITAMIN D3) 2000 units TABS Take 1 tablet by mouth every morning.    01/16/2020 at Unknown time  . finasteride (PROSCAR) 5 MG tablet Take 1 tablet (5 mg total) by mouth daily. 90 tablet 3 01/16/2020 at Unknown time  . furosemide (LASIX) 20 MG tablet TAKE 2 TABLETS (40 MG TOTAL) BY MOUTH DAILY. (NEEDS TO BE SEEN BEFORE NEXT REFILL) (Patient taking differently: Take 40 mg by mouth every morning. ) 60 tablet 0 01/16/2020 at Unknown time  . metoprolol tartrate (LOPRESSOR) 25 MG tablet TAKE 1.5 TABLETS (37.5 MG TOTAL) BY MOUTH 2 (TWO) TIMES DAILY. (NEEDS TO BE SEEN BEFORE NEXT REFILL) 90 tablet 0 01/16/2020 at 1800  . mirabegron ER (MYRBETRIQ) 50 MG TB24 tablet Take 50 mg by mouth every evening.    01/16/2020 at Unknown time  . Multiple Vitamin (MULTIVITAMIN) tablet Take 1 tablet by  mouth daily.   01/16/2020 at Unknown time  . Multiple Vitamins-Minerals (PRESERVISION AREDS PO) Take 1 capsule by mouth 2 (two) times daily.   01/16/2020 at Unknown time  . sertraline (ZOLOFT) 50 MG tablet Take 1 tablet (50 mg total) by mouth daily. 30 tablet 2 01/16/2020 at Unknown time  . SPIRIVA HANDIHALER 18 MCG inhalation capsule PLACE 1 CAPSULE INTO HANDIHALER AND INHALE DAILY (Patient taking differently: Place 18 mcg into inhaler and inhale daily. ) 30 capsule 2 01/16/2020 at Unknown time  . sulfamethoxazole-trimethoprim (BACTRIM DS) 800-160 MG tablet Take 1 tablet by mouth 2 (two) times daily. (Patient taking differently: Take 1 tablet by mouth 2 (two) times daily. 10 day course starting on 01/12/2020) 20 tablet 0 01/16/2020 at Unknown time  . tamsulosin (FLOMAX) 0.4 MG CAPS capsule Take 2 capsules (0.8 mg total) by mouth daily. (Needs to be seen before next refill) (Patient taking differently: Take 0.8 mg by mouth daily. ) 60 capsule 0 01/16/2020 at Unknown time  . warfarin (COUMADIN) 2 MG tablet Take 1 tablet as directed every tues, thurs, sat (Patient taking differently: Take 2 mg by mouth See admin instructions. Take 2mg  by mouth every tues, thurs, sat. (Takes 3mg  on all other days)) 90 tablet 0 01/14/2020  at Unknown time  . warfarin (COUMADIN) 3 MG tablet TAKE 1 TABLET BY MOUTH DAILY (Patient taking differently: Take 3 mg by mouth See admin instructions. Takes 3mg  on all days except on Tuesdays Thursdays and Saturdays (Takes 2mg  on Tuesdays, Thursdays, and Saturdays)) 90 tablet 1 01/16/2020 at 1800   Scheduled:  . atorvastatin  80 mg Oral Q M,W,F  . finasteride  5 mg Oral Daily  . metoprolol tartrate  37.5 mg Oral BID  . mirabegron ER  50 mg Oral QPM  . sertraline  50 mg Oral Daily  . sodium chloride flush  3 mL Intravenous Q12H  . tamsulosin  0.8 mg Oral Daily  . umeclidinium bromide  1 puff Inhalation Daily  . Warfarin - Pharmacist Dosing Inpatient   Does not apply q1800   Infusions:  .  sodium chloride     PRN: sodium chloride, acetaminophen **OR** acetaminophen, HYDROcodone-acetaminophen, ondansetron **OR** ondansetron (ZOFRAN) IV, polyethylene glycol, sodium chloride flush Anti-infectives (From admission, onward)   Start     Dose/Rate Route Frequency Ordered Stop   01/18/20 1000  sulfamethoxazole-trimethoprim (BACTRIM DS) 800-160 MG per tablet 1 tablet    Note to Pharmacy: Patient taking differently: 10 day course starting on 01/12/2020     1 tablet Oral 2 times daily 01/18/20 0000 01/22/20 2200      Goal of Therapy:  INR 2-3 Monitor platelets by anticoagulation protocol: Yes    Prior to Admission Warfarin Dosing:  Travis Cruz takes 3mg  of warfarin on all days except Tuesday Thursday Saturday he takes 2mg .      Admit INR  3.2>> 1.5>> 1.2>> 1.5  Lab Results  Component Value Date   INR 1.5 (H) 01/24/2020   INR 1.2 01/23/2020   INR 1.5 (H) 01/21/2020    Assessment: Travis Cruz a 74 y.o. male requires anticoagulation with warfarin for the indication of  VTE prophylaxis. Patient has a history of PE and CVA. Patient has frequent falls. Coumadin on hold for a couple of days and restarted 1/24.  Warfarin will be initiated inpatient following pharmacy protocol per pharmacy consult. Patient most recent blood work is as follows: CBC Latest Ref Rng & Units 01/24/2020 01/23/2020 01/18/2020  WBC 4.0 - 10.5 K/uL 12.1(H) 11.7(H) 10.8(H)  Hemoglobin 13.0 - 17.0 g/dL 15.3 15.3 15.2  Hematocrit 39.0 - 52.0 % 48.6 48.8 49.1  Platelets 150 - 400 K/uL 279 288 256   INR trending up.  Plan: Warfarin 3mg  po x 1 Monitor CBC daily with am labs   Monitor INR daily Monitor for signs and symptoms of bleeding   Travis Cruz, BS [place], BCPS Clinical Pharmacist Pager [phone]

## 2020-01-24 NOTE — Discharge Instructions (Signed)
1) please recheck PT/INR every Wednesday and  Saturdays for the next 2 weeks and then every Wednesday only after that--- Next INR check will be Wednesday, 01/25/2020 2) you are taking Coumadin which is a blood thinner , so please Avoid ibuprofen/Advil/Aleve/Motrin/Goody Powders/Naproxen/BC powders/Meloxicam/Diclofenac/Indomethacin and other Nonsteroidal anti-inflammatory medications as these will make you more likely to bleed and can cause stomach ulcers, can also cause Kidney problems.  3) physical therapist will come to your house to help you get stronger/rehab 4) please have repeat CBC and BMP blood test with her primary care physician on couple of weeks
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
## 2020-01-25 ENCOUNTER — Ambulatory Visit (INDEPENDENT_AMBULATORY_CARE_PROVIDER_SITE_OTHER): Payer: BC Managed Care – PPO

## 2020-01-25 ENCOUNTER — Other Ambulatory Visit: Payer: Self-pay

## 2020-01-25 ENCOUNTER — Telehealth: Payer: Self-pay | Admitting: Family Medicine

## 2020-01-25 DIAGNOSIS — F0151 Vascular dementia with behavioral disturbance: Secondary | ICD-10-CM | POA: Diagnosis not present

## 2020-01-25 DIAGNOSIS — I110 Hypertensive heart disease with heart failure: Secondary | ICD-10-CM | POA: Diagnosis not present

## 2020-01-25 DIAGNOSIS — Z86718 Personal history of other venous thrombosis and embolism: Secondary | ICD-10-CM

## 2020-01-25 DIAGNOSIS — I252 Old myocardial infarction: Secondary | ICD-10-CM

## 2020-01-25 DIAGNOSIS — Q821 Xeroderma pigmentosum: Secondary | ICD-10-CM

## 2020-01-25 DIAGNOSIS — S7011XD Contusion of right thigh, subsequent encounter: Secondary | ICD-10-CM

## 2020-01-25 DIAGNOSIS — I255 Ischemic cardiomyopathy: Secondary | ICD-10-CM

## 2020-01-25 DIAGNOSIS — I509 Heart failure, unspecified: Secondary | ICD-10-CM | POA: Diagnosis not present

## 2020-01-25 DIAGNOSIS — Z86711 Personal history of pulmonary embolism: Secondary | ICD-10-CM

## 2020-01-25 DIAGNOSIS — Z8701 Personal history of pneumonia (recurrent): Secondary | ICD-10-CM

## 2020-01-25 DIAGNOSIS — I2510 Atherosclerotic heart disease of native coronary artery without angina pectoris: Secondary | ICD-10-CM

## 2020-01-25 DIAGNOSIS — W19XXXD Unspecified fall, subsequent encounter: Secondary | ICD-10-CM | POA: Diagnosis not present

## 2020-01-25 DIAGNOSIS — D630 Anemia in neoplastic disease: Secondary | ICD-10-CM | POA: Diagnosis not present

## 2020-01-25 DIAGNOSIS — J439 Emphysema, unspecified: Secondary | ICD-10-CM

## 2020-01-25 DIAGNOSIS — C3412 Malignant neoplasm of upper lobe, left bronchus or lung: Secondary | ICD-10-CM | POA: Diagnosis not present

## 2020-01-25 DIAGNOSIS — G8222 Paraplegia, incomplete: Secondary | ICD-10-CM | POA: Diagnosis not present

## 2020-01-25 DIAGNOSIS — G4733 Obstructive sleep apnea (adult) (pediatric): Secondary | ICD-10-CM

## 2020-01-25 DIAGNOSIS — Z7901 Long term (current) use of anticoagulants: Secondary | ICD-10-CM | POA: Diagnosis not present

## 2020-01-25 DIAGNOSIS — L02416 Cutaneous abscess of left lower limb: Secondary | ICD-10-CM

## 2020-01-25 DIAGNOSIS — M1712 Unilateral primary osteoarthritis, left knee: Secondary | ICD-10-CM

## 2020-01-25 DIAGNOSIS — Z95810 Presence of automatic (implantable) cardiac defibrillator: Secondary | ICD-10-CM

## 2020-01-25 DIAGNOSIS — Z9981 Dependence on supplemental oxygen: Secondary | ICD-10-CM

## 2020-01-25 NOTE — Telephone Encounter (Signed)
Wife requested televisit. appt made 02/09/20

## 2020-01-26 ENCOUNTER — Telehealth: Payer: Self-pay | Admitting: *Deleted

## 2020-01-26 DIAGNOSIS — Z86718 Personal history of other venous thrombosis and embolism: Secondary | ICD-10-CM

## 2020-01-26 DIAGNOSIS — Z86711 Personal history of pulmonary embolism: Secondary | ICD-10-CM

## 2020-01-26 NOTE — Telephone Encounter (Signed)
Fax received [facility]INR PT/INR self testing service Test date/time 01/25/20 5:28 pm INR 2.7

## 2020-01-26 NOTE — Telephone Encounter (Signed)
Description   continue to take take 2 mg every Tuesday, Thursday and Saturday and then continue on 3 mg every other day of the week  INR 2.7  (goal is 2-3)  Recheck in 1 to 2 weeks       Caryl Pina, MD [facility] 01/26/2020, 3:54 PM

## 2020-01-26 NOTE — Telephone Encounter (Signed)
Wife aware

## 2020-01-27 DIAGNOSIS — L02416 Cutaneous abscess of left lower limb: Secondary | ICD-10-CM | POA: Diagnosis not present

## 2020-01-27 DIAGNOSIS — S7011XD Contusion of right thigh, subsequent encounter: Secondary | ICD-10-CM | POA: Diagnosis not present

## 2020-01-27 DIAGNOSIS — G8222 Paraplegia, incomplete: Secondary | ICD-10-CM | POA: Diagnosis not present

## 2020-01-27 DIAGNOSIS — I2510 Atherosclerotic heart disease of native coronary artery without angina pectoris: Secondary | ICD-10-CM | POA: Diagnosis not present

## 2020-01-27 DIAGNOSIS — I252 Old myocardial infarction: Secondary | ICD-10-CM | POA: Diagnosis not present

## 2020-01-27 DIAGNOSIS — J439 Emphysema, unspecified: Secondary | ICD-10-CM | POA: Diagnosis not present

## 2020-01-27 DIAGNOSIS — F0151 Vascular dementia with behavioral disturbance: Secondary | ICD-10-CM | POA: Diagnosis not present

## 2020-01-27 DIAGNOSIS — I255 Ischemic cardiomyopathy: Secondary | ICD-10-CM | POA: Diagnosis not present

## 2020-01-27 DIAGNOSIS — I110 Hypertensive heart disease with heart failure: Secondary | ICD-10-CM | POA: Diagnosis not present

## 2020-01-27 DIAGNOSIS — D630 Anemia in neoplastic disease: Secondary | ICD-10-CM | POA: Diagnosis not present

## 2020-01-27 DIAGNOSIS — I509 Heart failure, unspecified: Secondary | ICD-10-CM | POA: Diagnosis not present

## 2020-01-27 DIAGNOSIS — C3412 Malignant neoplasm of upper lobe, left bronchus or lung: Secondary | ICD-10-CM | POA: Diagnosis not present

## 2020-01-27 DIAGNOSIS — W19XXXD Unspecified fall, subsequent encounter: Secondary | ICD-10-CM | POA: Diagnosis not present

## 2020-01-28 DIAGNOSIS — S7011XD Contusion of right thigh, subsequent encounter: Secondary | ICD-10-CM | POA: Diagnosis not present

## 2020-01-28 DIAGNOSIS — L02416 Cutaneous abscess of left lower limb: Secondary | ICD-10-CM | POA: Diagnosis not present

## 2020-01-28 DIAGNOSIS — D630 Anemia in neoplastic disease: Secondary | ICD-10-CM | POA: Diagnosis not present

## 2020-01-28 DIAGNOSIS — I2510 Atherosclerotic heart disease of native coronary artery without angina pectoris: Secondary | ICD-10-CM | POA: Diagnosis not present

## 2020-01-28 DIAGNOSIS — I252 Old myocardial infarction: Secondary | ICD-10-CM | POA: Diagnosis not present

## 2020-01-28 DIAGNOSIS — W19XXXD Unspecified fall, subsequent encounter: Secondary | ICD-10-CM | POA: Diagnosis not present

## 2020-01-28 DIAGNOSIS — I509 Heart failure, unspecified: Secondary | ICD-10-CM | POA: Diagnosis not present

## 2020-01-28 DIAGNOSIS — C3412 Malignant neoplasm of upper lobe, left bronchus or lung: Secondary | ICD-10-CM | POA: Diagnosis not present

## 2020-01-28 DIAGNOSIS — G8222 Paraplegia, incomplete: Secondary | ICD-10-CM | POA: Diagnosis not present

## 2020-01-28 DIAGNOSIS — J439 Emphysema, unspecified: Secondary | ICD-10-CM | POA: Diagnosis not present

## 2020-01-28 DIAGNOSIS — F0151 Vascular dementia with behavioral disturbance: Secondary | ICD-10-CM | POA: Diagnosis not present

## 2020-01-28 DIAGNOSIS — I255 Ischemic cardiomyopathy: Secondary | ICD-10-CM | POA: Diagnosis not present

## 2020-01-28 DIAGNOSIS — I110 Hypertensive heart disease with heart failure: Secondary | ICD-10-CM | POA: Diagnosis not present

## 2020-01-30 ENCOUNTER — Telehealth: Payer: Self-pay | Admitting: *Deleted

## 2020-01-30 DIAGNOSIS — L02416 Cutaneous abscess of left lower limb: Secondary | ICD-10-CM | POA: Diagnosis not present

## 2020-01-30 DIAGNOSIS — I255 Ischemic cardiomyopathy: Secondary | ICD-10-CM | POA: Diagnosis not present

## 2020-01-30 DIAGNOSIS — D630 Anemia in neoplastic disease: Secondary | ICD-10-CM | POA: Diagnosis not present

## 2020-01-30 DIAGNOSIS — W19XXXD Unspecified fall, subsequent encounter: Secondary | ICD-10-CM | POA: Diagnosis not present

## 2020-01-30 DIAGNOSIS — I252 Old myocardial infarction: Secondary | ICD-10-CM | POA: Diagnosis not present

## 2020-01-30 DIAGNOSIS — F0151 Vascular dementia with behavioral disturbance: Secondary | ICD-10-CM | POA: Diagnosis not present

## 2020-01-30 DIAGNOSIS — Z86718 Personal history of other venous thrombosis and embolism: Secondary | ICD-10-CM

## 2020-01-30 DIAGNOSIS — I509 Heart failure, unspecified: Secondary | ICD-10-CM | POA: Diagnosis not present

## 2020-01-30 DIAGNOSIS — J439 Emphysema, unspecified: Secondary | ICD-10-CM | POA: Diagnosis not present

## 2020-01-30 DIAGNOSIS — S7011XD Contusion of right thigh, subsequent encounter: Secondary | ICD-10-CM | POA: Diagnosis not present

## 2020-01-30 DIAGNOSIS — Z86711 Personal history of pulmonary embolism: Secondary | ICD-10-CM

## 2020-01-30 DIAGNOSIS — G8222 Paraplegia, incomplete: Secondary | ICD-10-CM | POA: Diagnosis not present

## 2020-01-30 DIAGNOSIS — I110 Hypertensive heart disease with heart failure: Secondary | ICD-10-CM | POA: Diagnosis not present

## 2020-01-30 DIAGNOSIS — C3412 Malignant neoplasm of upper lobe, left bronchus or lung: Secondary | ICD-10-CM | POA: Diagnosis not present

## 2020-01-30 DIAGNOSIS — I2510 Atherosclerotic heart disease of native coronary artery without angina pectoris: Secondary | ICD-10-CM | POA: Diagnosis not present

## 2020-01-30 NOTE — Telephone Encounter (Signed)
Fax received mdINR PT/INR self testing service Test date/time 01/28/20 6:13 pm INR 2.6

## 2020-01-30 NOTE — Telephone Encounter (Signed)
Description   continue to take take 2 mg every Tuesday, Thursday and Saturday and then continue on 3 mg every other day of the week  INR 2.6  (goal is 2-3)  Recheck in 1 to 2 weeks       Caryl Pina, MD [place] 01/30/2020, 8:25 AM

## 2020-02-01 DIAGNOSIS — I252 Old myocardial infarction: Secondary | ICD-10-CM | POA: Diagnosis not present

## 2020-02-01 DIAGNOSIS — C3412 Malignant neoplasm of upper lobe, left bronchus or lung: Secondary | ICD-10-CM | POA: Diagnosis not present

## 2020-02-01 DIAGNOSIS — I509 Heart failure, unspecified: Secondary | ICD-10-CM | POA: Diagnosis not present

## 2020-02-01 DIAGNOSIS — I255 Ischemic cardiomyopathy: Secondary | ICD-10-CM | POA: Diagnosis not present

## 2020-02-01 DIAGNOSIS — L02416 Cutaneous abscess of left lower limb: Secondary | ICD-10-CM | POA: Diagnosis not present

## 2020-02-01 DIAGNOSIS — S7011XD Contusion of right thigh, subsequent encounter: Secondary | ICD-10-CM | POA: Diagnosis not present

## 2020-02-01 DIAGNOSIS — I110 Hypertensive heart disease with heart failure: Secondary | ICD-10-CM | POA: Diagnosis not present

## 2020-02-01 DIAGNOSIS — D630 Anemia in neoplastic disease: Secondary | ICD-10-CM | POA: Diagnosis not present

## 2020-02-01 DIAGNOSIS — J439 Emphysema, unspecified: Secondary | ICD-10-CM | POA: Diagnosis not present

## 2020-02-01 DIAGNOSIS — F0151 Vascular dementia with behavioral disturbance: Secondary | ICD-10-CM | POA: Diagnosis not present

## 2020-02-01 DIAGNOSIS — G8222 Paraplegia, incomplete: Secondary | ICD-10-CM | POA: Diagnosis not present

## 2020-02-01 DIAGNOSIS — W19XXXD Unspecified fall, subsequent encounter: Secondary | ICD-10-CM | POA: Diagnosis not present

## 2020-02-01 DIAGNOSIS — I2510 Atherosclerotic heart disease of native coronary artery without angina pectoris: Secondary | ICD-10-CM | POA: Diagnosis not present

## 2020-02-02 ENCOUNTER — Telehealth: Payer: Self-pay | Admitting: *Deleted

## 2020-02-02 DIAGNOSIS — Z86718 Personal history of other venous thrombosis and embolism: Secondary | ICD-10-CM

## 2020-02-02 DIAGNOSIS — Z86711 Personal history of pulmonary embolism: Secondary | ICD-10-CM

## 2020-02-02 NOTE — Telephone Encounter (Signed)
Fax received [facility]INR PT/INR self testing service Test date/time 02/01/20 6:41 pm INR 2.9

## 2020-02-02 NOTE — Telephone Encounter (Signed)
Description   continue to take take 2 mg every Tuesday, Thursday and Saturday and then continue on 3 mg every other day of the week  INR 2.9  (goal is 2-3)  Recheck in 1 to 2 weeks       Caryl Pina, MD [place] 02/02/2020, 12:10 PM

## 2020-02-02 NOTE — Telephone Encounter (Signed)
Patients wife aware and verbalized understanding.

## 2020-02-03 ENCOUNTER — Other Ambulatory Visit: Payer: Self-pay | Admitting: Family Medicine

## 2020-02-03 DIAGNOSIS — I509 Heart failure, unspecified: Secondary | ICD-10-CM | POA: Diagnosis not present

## 2020-02-03 DIAGNOSIS — W19XXXD Unspecified fall, subsequent encounter: Secondary | ICD-10-CM | POA: Diagnosis not present

## 2020-02-03 DIAGNOSIS — C3412 Malignant neoplasm of upper lobe, left bronchus or lung: Secondary | ICD-10-CM | POA: Diagnosis not present

## 2020-02-03 DIAGNOSIS — I252 Old myocardial infarction: Secondary | ICD-10-CM | POA: Diagnosis not present

## 2020-02-03 DIAGNOSIS — J439 Emphysema, unspecified: Secondary | ICD-10-CM | POA: Diagnosis not present

## 2020-02-03 DIAGNOSIS — L02416 Cutaneous abscess of left lower limb: Secondary | ICD-10-CM | POA: Diagnosis not present

## 2020-02-03 DIAGNOSIS — N400 Enlarged prostate without lower urinary tract symptoms: Secondary | ICD-10-CM

## 2020-02-03 DIAGNOSIS — R609 Edema, unspecified: Secondary | ICD-10-CM

## 2020-02-03 DIAGNOSIS — I2510 Atherosclerotic heart disease of native coronary artery without angina pectoris: Secondary | ICD-10-CM | POA: Diagnosis not present

## 2020-02-03 DIAGNOSIS — S7011XD Contusion of right thigh, subsequent encounter: Secondary | ICD-10-CM | POA: Diagnosis not present

## 2020-02-03 DIAGNOSIS — I255 Ischemic cardiomyopathy: Secondary | ICD-10-CM | POA: Diagnosis not present

## 2020-02-03 DIAGNOSIS — F0151 Vascular dementia with behavioral disturbance: Secondary | ICD-10-CM | POA: Diagnosis not present

## 2020-02-03 DIAGNOSIS — I110 Hypertensive heart disease with heart failure: Secondary | ICD-10-CM | POA: Diagnosis not present

## 2020-02-03 DIAGNOSIS — G8222 Paraplegia, incomplete: Secondary | ICD-10-CM | POA: Diagnosis not present

## 2020-02-03 DIAGNOSIS — D630 Anemia in neoplastic disease: Secondary | ICD-10-CM | POA: Diagnosis not present

## 2020-02-05 DIAGNOSIS — J449 Chronic obstructive pulmonary disease, unspecified: Secondary | ICD-10-CM | POA: Diagnosis not present

## 2020-02-06 DIAGNOSIS — C3412 Malignant neoplasm of upper lobe, left bronchus or lung: Secondary | ICD-10-CM | POA: Diagnosis not present

## 2020-02-06 DIAGNOSIS — W19XXXD Unspecified fall, subsequent encounter: Secondary | ICD-10-CM | POA: Diagnosis not present

## 2020-02-06 DIAGNOSIS — I110 Hypertensive heart disease with heart failure: Secondary | ICD-10-CM | POA: Diagnosis not present

## 2020-02-06 DIAGNOSIS — G8222 Paraplegia, incomplete: Secondary | ICD-10-CM | POA: Diagnosis not present

## 2020-02-06 DIAGNOSIS — J439 Emphysema, unspecified: Secondary | ICD-10-CM | POA: Diagnosis not present

## 2020-02-06 DIAGNOSIS — F0151 Vascular dementia with behavioral disturbance: Secondary | ICD-10-CM | POA: Diagnosis not present

## 2020-02-06 DIAGNOSIS — I255 Ischemic cardiomyopathy: Secondary | ICD-10-CM | POA: Diagnosis not present

## 2020-02-06 DIAGNOSIS — I509 Heart failure, unspecified: Secondary | ICD-10-CM | POA: Diagnosis not present

## 2020-02-06 DIAGNOSIS — I2510 Atherosclerotic heart disease of native coronary artery without angina pectoris: Secondary | ICD-10-CM | POA: Diagnosis not present

## 2020-02-06 DIAGNOSIS — I252 Old myocardial infarction: Secondary | ICD-10-CM | POA: Diagnosis not present

## 2020-02-06 DIAGNOSIS — S7011XD Contusion of right thigh, subsequent encounter: Secondary | ICD-10-CM | POA: Diagnosis not present

## 2020-02-06 DIAGNOSIS — L02416 Cutaneous abscess of left lower limb: Secondary | ICD-10-CM | POA: Diagnosis not present

## 2020-02-06 DIAGNOSIS — D630 Anemia in neoplastic disease: Secondary | ICD-10-CM | POA: Diagnosis not present

## 2020-02-07 ENCOUNTER — Other Ambulatory Visit: Payer: Self-pay | Admitting: Family Medicine

## 2020-02-07 DIAGNOSIS — N400 Enlarged prostate without lower urinary tract symptoms: Secondary | ICD-10-CM

## 2020-02-08 ENCOUNTER — Encounter: Payer: Self-pay | Admitting: Family Medicine

## 2020-02-08 ENCOUNTER — Other Ambulatory Visit: Payer: Self-pay

## 2020-02-08 ENCOUNTER — Ambulatory Visit (INDEPENDENT_AMBULATORY_CARE_PROVIDER_SITE_OTHER): Payer: BC Managed Care – PPO | Admitting: Family Medicine

## 2020-02-08 DIAGNOSIS — Z7901 Long term (current) use of anticoagulants: Secondary | ICD-10-CM

## 2020-02-08 DIAGNOSIS — R296 Repeated falls: Secondary | ICD-10-CM | POA: Diagnosis not present

## 2020-02-08 DIAGNOSIS — Z87891 Personal history of nicotine dependence: Secondary | ICD-10-CM

## 2020-02-08 NOTE — Progress Notes (Signed)
Virtual Visit via telephone Note  I connected with Travis Cruz on 02/08/20 at 1348 by telephone and verified that I am speaking with the correct person using two identifiers. Travis Cruz is currently located at home and wife [name] are currently with her during visit. The provider, Fransisca Kaufmann Jonee Lamore, MD is located in their office at time of visit.  Call ended at 1400  I discussed the limitations, risks, security and privacy concerns of performing an evaluation and management service by telephone and the availability of in person appointments. I also discussed with the patient that there may be a patient responsible charge related to this service. The patient expressed understanding and agreed to proceed.   History and Present Illness: Patient had a fall again and was in the ED 01/17/20 til 01/24/20.  He is working with home health and therapy and is walking more with gait belt.  He is doing exercises.  He is doing therapy and exercises everyday.  He is doing better with a gait belt.  He is walking but still has some balance issues. He was able to get in the shower. He seems to be doing better. He will likely need outpatient PT/ once he finishes. They have a home health nurse coming once per week.    1. Recurrent falls     Outpatient Encounter Medications as of 02/08/2020  Medication Sig  . acetaminophen (TYLENOL) 325 MG tablet Take 2 tablets (650 mg total) by mouth every 6 (six) hours as needed for mild pain, fever or headache (or Fever >/= 101).  [name] atorvastatin (LIPITOR) 80 MG tablet Take 1 tablet (80 mg total) by mouth every Monday, Wednesday, and Friday.  . Cholecalciferol (VITAMIN D3) 2000 units TABS Take 1 tablet by mouth every morning.   . finasteride (PROSCAR) 5 MG tablet Take 1 tablet (5 mg total) by mouth daily.  . furosemide (LASIX) 20 MG tablet Take 1 tablet (20 mg total) by mouth every morning.  . metoprolol tartrate (LOPRESSOR) 25 MG tablet Take 1.5 tablets (37.5 mg total) by  mouth 2 (two) times daily.  . mirabegron ER (MYRBETRIQ) 50 MG TB24 tablet Take 1 tablet (50 mg total) by mouth every evening.  . Multiple Vitamin (MULTIVITAMIN) tablet Take 1 tablet by mouth daily.  . Multiple Vitamins-Minerals (PRESERVISION AREDS PO) Take 1 capsule by mouth 2 (two) times daily.  . ondansetron (ZOFRAN) 4 MG tablet Take 1 tablet (4 mg total) by mouth every 6 (six) hours as needed for nausea.  . sertraline (ZOLOFT) 50 MG tablet Take 1 tablet (50 mg total) by mouth daily.  . tamsulosin (FLOMAX) 0.4 MG CAPS capsule Take 1 capsule (0.4 mg total) by mouth daily after supper. (Needs to be seen before next refill)  . tiotropium (SPIRIVA HANDIHALER) 18 MCG inhalation capsule Place 1 capsule (18 mcg total) into inhaler and inhale daily.  [name] warfarin (COUMADIN) 2 MG tablet Take 1 tablet (2 mg total) by mouth See admin instructions. Take 2mg  by mouth every tues, thurs, sat. (Takes 3mg  on all other days)  . warfarin (COUMADIN) 3 MG tablet Take 1 tablet (3 mg total) by mouth See admin instructions. Takes 3mg  on all days except on Tuesdays Thursdays and Saturdays (Takes 2mg  on Tuesdays, Thursdays, and Saturdays)   No facility-administered encounter medications on file as of 02/08/2020.    Review of Systems  Constitutional: Negative for chills and fever.  Eyes: Negative for visual disturbance.  Respiratory: Negative for shortness of breath and wheezing.  Cardiovascular: Negative for chest pain and leg swelling.  Musculoskeletal: Negative for back pain and gait problem.  Skin: Positive for wound (small wound left leg near near knee). Negative for rash.  Neurological: Negative for dizziness, weakness and numbness.  All other systems reviewed and are negative.   Observations/Objective: Patient sounds comfortable and in no acute distress  Assessment and Plan: Problem List Items Addressed This Visit    None    Visit Diagnoses    Recurrent falls    -  Primary      Has home health pt and  will do future PT outpatient Follow up plan: Return if symptoms worsen or fail to improve.     I discussed the assessment and treatment plan with the patient. The patient was provided an opportunity to ask questions and all were answered. The patient agreed with the plan and demonstrated an understanding of the instructions.   The patient was advised to call back or seek an in-person evaluation if the symptoms worsen or if the condition fails to improve as anticipated.  The above assessment and management plan was discussed with the patient. The patient verbalized understanding of and has agreed to the management plan. Patient is aware to call the clinic if symptoms persist or worsen. Patient is aware when to return to the clinic for a follow-up visit. Patient educated on when it is appropriate to go to the emergency department.    I provided 12 minutes of non-face-to-face time during this encounter.    Worthy Rancher, MD

## 2020-02-09 ENCOUNTER — Telehealth: Payer: Self-pay | Admitting: *Deleted

## 2020-02-09 DIAGNOSIS — C3412 Malignant neoplasm of upper lobe, left bronchus or lung: Secondary | ICD-10-CM | POA: Diagnosis not present

## 2020-02-09 DIAGNOSIS — S7011XD Contusion of right thigh, subsequent encounter: Secondary | ICD-10-CM | POA: Diagnosis not present

## 2020-02-09 DIAGNOSIS — I252 Old myocardial infarction: Secondary | ICD-10-CM | POA: Diagnosis not present

## 2020-02-09 DIAGNOSIS — W19XXXD Unspecified fall, subsequent encounter: Secondary | ICD-10-CM | POA: Diagnosis not present

## 2020-02-09 DIAGNOSIS — I110 Hypertensive heart disease with heart failure: Secondary | ICD-10-CM | POA: Diagnosis not present

## 2020-02-09 DIAGNOSIS — J439 Emphysema, unspecified: Secondary | ICD-10-CM | POA: Diagnosis not present

## 2020-02-09 DIAGNOSIS — I2510 Atherosclerotic heart disease of native coronary artery without angina pectoris: Secondary | ICD-10-CM | POA: Diagnosis not present

## 2020-02-09 DIAGNOSIS — L02416 Cutaneous abscess of left lower limb: Secondary | ICD-10-CM | POA: Diagnosis not present

## 2020-02-09 DIAGNOSIS — I509 Heart failure, unspecified: Secondary | ICD-10-CM | POA: Diagnosis not present

## 2020-02-09 DIAGNOSIS — I255 Ischemic cardiomyopathy: Secondary | ICD-10-CM | POA: Diagnosis not present

## 2020-02-09 DIAGNOSIS — G8222 Paraplegia, incomplete: Secondary | ICD-10-CM | POA: Diagnosis not present

## 2020-02-09 DIAGNOSIS — Z86711 Personal history of pulmonary embolism: Secondary | ICD-10-CM

## 2020-02-09 DIAGNOSIS — D630 Anemia in neoplastic disease: Secondary | ICD-10-CM | POA: Diagnosis not present

## 2020-02-09 DIAGNOSIS — F0151 Vascular dementia with behavioral disturbance: Secondary | ICD-10-CM | POA: Diagnosis not present

## 2020-02-09 DIAGNOSIS — Z86718 Personal history of other venous thrombosis and embolism: Secondary | ICD-10-CM

## 2020-02-09 NOTE — Telephone Encounter (Signed)
Fax received mdINR PT/INR self testing service Test date/time 02/08/20 6:46 pm INR 2.4

## 2020-02-10 ENCOUNTER — Encounter: Payer: Self-pay | Admitting: Family Medicine

## 2020-02-10 DIAGNOSIS — W19XXXD Unspecified fall, subsequent encounter: Secondary | ICD-10-CM | POA: Diagnosis not present

## 2020-02-10 DIAGNOSIS — L02416 Cutaneous abscess of left lower limb: Secondary | ICD-10-CM | POA: Diagnosis not present

## 2020-02-10 DIAGNOSIS — C3412 Malignant neoplasm of upper lobe, left bronchus or lung: Secondary | ICD-10-CM | POA: Diagnosis not present

## 2020-02-10 DIAGNOSIS — I509 Heart failure, unspecified: Secondary | ICD-10-CM | POA: Diagnosis not present

## 2020-02-10 DIAGNOSIS — I255 Ischemic cardiomyopathy: Secondary | ICD-10-CM | POA: Diagnosis not present

## 2020-02-10 DIAGNOSIS — J439 Emphysema, unspecified: Secondary | ICD-10-CM | POA: Diagnosis not present

## 2020-02-10 DIAGNOSIS — I110 Hypertensive heart disease with heart failure: Secondary | ICD-10-CM | POA: Diagnosis not present

## 2020-02-10 DIAGNOSIS — S7011XD Contusion of right thigh, subsequent encounter: Secondary | ICD-10-CM | POA: Diagnosis not present

## 2020-02-10 DIAGNOSIS — G8222 Paraplegia, incomplete: Secondary | ICD-10-CM | POA: Diagnosis not present

## 2020-02-10 DIAGNOSIS — D630 Anemia in neoplastic disease: Secondary | ICD-10-CM | POA: Diagnosis not present

## 2020-02-10 DIAGNOSIS — F0151 Vascular dementia with behavioral disturbance: Secondary | ICD-10-CM | POA: Diagnosis not present

## 2020-02-10 DIAGNOSIS — I2510 Atherosclerotic heart disease of native coronary artery without angina pectoris: Secondary | ICD-10-CM | POA: Diagnosis not present

## 2020-02-10 DIAGNOSIS — I252 Old myocardial infarction: Secondary | ICD-10-CM | POA: Diagnosis not present

## 2020-02-10 NOTE — Telephone Encounter (Signed)
Description   continue to take take 2 mg every Tuesday, Thursday and Saturday and then continue on 3 mg every other day of the week  INR 2.4  (goal is 2-3)  Recheck in 1 to 2 weeks        Caryl Pina, MD [place] 02/10/2020, 7:58 AM

## 2020-02-14 DIAGNOSIS — C3412 Malignant neoplasm of upper lobe, left bronchus or lung: Secondary | ICD-10-CM | POA: Diagnosis not present

## 2020-02-14 DIAGNOSIS — W19XXXD Unspecified fall, subsequent encounter: Secondary | ICD-10-CM | POA: Diagnosis not present

## 2020-02-14 DIAGNOSIS — I110 Hypertensive heart disease with heart failure: Secondary | ICD-10-CM | POA: Diagnosis not present

## 2020-02-14 DIAGNOSIS — I2510 Atherosclerotic heart disease of native coronary artery without angina pectoris: Secondary | ICD-10-CM | POA: Diagnosis not present

## 2020-02-14 DIAGNOSIS — F0151 Vascular dementia with behavioral disturbance: Secondary | ICD-10-CM | POA: Diagnosis not present

## 2020-02-14 DIAGNOSIS — S7011XD Contusion of right thigh, subsequent encounter: Secondary | ICD-10-CM | POA: Diagnosis not present

## 2020-02-14 DIAGNOSIS — I255 Ischemic cardiomyopathy: Secondary | ICD-10-CM | POA: Diagnosis not present

## 2020-02-14 DIAGNOSIS — D630 Anemia in neoplastic disease: Secondary | ICD-10-CM | POA: Diagnosis not present

## 2020-02-14 DIAGNOSIS — I509 Heart failure, unspecified: Secondary | ICD-10-CM | POA: Diagnosis not present

## 2020-02-14 DIAGNOSIS — J439 Emphysema, unspecified: Secondary | ICD-10-CM | POA: Diagnosis not present

## 2020-02-14 DIAGNOSIS — I252 Old myocardial infarction: Secondary | ICD-10-CM | POA: Diagnosis not present

## 2020-02-14 DIAGNOSIS — G8222 Paraplegia, incomplete: Secondary | ICD-10-CM | POA: Diagnosis not present

## 2020-02-14 DIAGNOSIS — L02416 Cutaneous abscess of left lower limb: Secondary | ICD-10-CM | POA: Diagnosis not present

## 2020-02-16 DIAGNOSIS — F0151 Vascular dementia with behavioral disturbance: Secondary | ICD-10-CM | POA: Diagnosis not present

## 2020-02-16 DIAGNOSIS — C3412 Malignant neoplasm of upper lobe, left bronchus or lung: Secondary | ICD-10-CM | POA: Diagnosis not present

## 2020-02-16 DIAGNOSIS — I255 Ischemic cardiomyopathy: Secondary | ICD-10-CM | POA: Diagnosis not present

## 2020-02-16 DIAGNOSIS — J439 Emphysema, unspecified: Secondary | ICD-10-CM | POA: Diagnosis not present

## 2020-02-16 DIAGNOSIS — I110 Hypertensive heart disease with heart failure: Secondary | ICD-10-CM | POA: Diagnosis not present

## 2020-02-16 DIAGNOSIS — D630 Anemia in neoplastic disease: Secondary | ICD-10-CM | POA: Diagnosis not present

## 2020-02-16 DIAGNOSIS — G4733 Obstructive sleep apnea (adult) (pediatric): Secondary | ICD-10-CM | POA: Diagnosis not present

## 2020-02-16 DIAGNOSIS — S7011XD Contusion of right thigh, subsequent encounter: Secondary | ICD-10-CM | POA: Diagnosis not present

## 2020-02-16 DIAGNOSIS — I509 Heart failure, unspecified: Secondary | ICD-10-CM | POA: Diagnosis not present

## 2020-02-16 DIAGNOSIS — G8222 Paraplegia, incomplete: Secondary | ICD-10-CM | POA: Diagnosis not present

## 2020-02-16 DIAGNOSIS — L02416 Cutaneous abscess of left lower limb: Secondary | ICD-10-CM | POA: Diagnosis not present

## 2020-02-16 DIAGNOSIS — I252 Old myocardial infarction: Secondary | ICD-10-CM | POA: Diagnosis not present

## 2020-02-16 DIAGNOSIS — I2510 Atherosclerotic heart disease of native coronary artery without angina pectoris: Secondary | ICD-10-CM | POA: Diagnosis not present

## 2020-02-16 DIAGNOSIS — W19XXXD Unspecified fall, subsequent encounter: Secondary | ICD-10-CM | POA: Diagnosis not present

## 2020-02-18 DIAGNOSIS — I509 Heart failure, unspecified: Secondary | ICD-10-CM | POA: Diagnosis not present

## 2020-02-18 DIAGNOSIS — F0151 Vascular dementia with behavioral disturbance: Secondary | ICD-10-CM | POA: Diagnosis not present

## 2020-02-18 DIAGNOSIS — I252 Old myocardial infarction: Secondary | ICD-10-CM | POA: Diagnosis not present

## 2020-02-18 DIAGNOSIS — I110 Hypertensive heart disease with heart failure: Secondary | ICD-10-CM | POA: Diagnosis not present

## 2020-02-18 DIAGNOSIS — G8222 Paraplegia, incomplete: Secondary | ICD-10-CM | POA: Diagnosis not present

## 2020-02-18 DIAGNOSIS — I2510 Atherosclerotic heart disease of native coronary artery without angina pectoris: Secondary | ICD-10-CM | POA: Diagnosis not present

## 2020-02-18 DIAGNOSIS — S7011XD Contusion of right thigh, subsequent encounter: Secondary | ICD-10-CM | POA: Diagnosis not present

## 2020-02-18 DIAGNOSIS — D630 Anemia in neoplastic disease: Secondary | ICD-10-CM | POA: Diagnosis not present

## 2020-02-18 DIAGNOSIS — I255 Ischemic cardiomyopathy: Secondary | ICD-10-CM | POA: Diagnosis not present

## 2020-02-18 DIAGNOSIS — L02416 Cutaneous abscess of left lower limb: Secondary | ICD-10-CM | POA: Diagnosis not present

## 2020-02-18 DIAGNOSIS — W19XXXD Unspecified fall, subsequent encounter: Secondary | ICD-10-CM | POA: Diagnosis not present

## 2020-02-18 DIAGNOSIS — J439 Emphysema, unspecified: Secondary | ICD-10-CM | POA: Diagnosis not present

## 2020-02-18 DIAGNOSIS — C3412 Malignant neoplasm of upper lobe, left bronchus or lung: Secondary | ICD-10-CM | POA: Diagnosis not present

## 2020-02-20 ENCOUNTER — Telehealth: Payer: Self-pay | Admitting: *Deleted

## 2020-02-20 DIAGNOSIS — Z86718 Personal history of other venous thrombosis and embolism: Secondary | ICD-10-CM

## 2020-02-20 DIAGNOSIS — Z86711 Personal history of pulmonary embolism: Secondary | ICD-10-CM

## 2020-02-20 NOTE — Telephone Encounter (Signed)
Wife aware

## 2020-02-20 NOTE — Telephone Encounter (Signed)
Fax received mdINR PT/INR self testing service Test date/time 02/15/20 6:00 pm INR 2.3

## 2020-02-20 NOTE — Telephone Encounter (Signed)
Description   continue to take take 2 mg every Tuesday, Thursday and Saturday and then continue on 3 mg every other day of the week  INR 2.3  (goal is 2-3)  Recheck in 1 to 2 weeks       Caryl Pina, MD [facility] 02/20/2020, 3:09 PM

## 2020-02-21 DIAGNOSIS — J439 Emphysema, unspecified: Secondary | ICD-10-CM | POA: Diagnosis not present

## 2020-02-21 DIAGNOSIS — I252 Old myocardial infarction: Secondary | ICD-10-CM | POA: Diagnosis not present

## 2020-02-21 DIAGNOSIS — S7011XD Contusion of right thigh, subsequent encounter: Secondary | ICD-10-CM | POA: Diagnosis not present

## 2020-02-21 DIAGNOSIS — C3412 Malignant neoplasm of upper lobe, left bronchus or lung: Secondary | ICD-10-CM | POA: Diagnosis not present

## 2020-02-21 DIAGNOSIS — D630 Anemia in neoplastic disease: Secondary | ICD-10-CM | POA: Diagnosis not present

## 2020-02-21 DIAGNOSIS — L02416 Cutaneous abscess of left lower limb: Secondary | ICD-10-CM | POA: Diagnosis not present

## 2020-02-21 DIAGNOSIS — F0151 Vascular dementia with behavioral disturbance: Secondary | ICD-10-CM | POA: Diagnosis not present

## 2020-02-21 DIAGNOSIS — I255 Ischemic cardiomyopathy: Secondary | ICD-10-CM | POA: Diagnosis not present

## 2020-02-21 DIAGNOSIS — I509 Heart failure, unspecified: Secondary | ICD-10-CM | POA: Diagnosis not present

## 2020-02-21 DIAGNOSIS — G8222 Paraplegia, incomplete: Secondary | ICD-10-CM | POA: Diagnosis not present

## 2020-02-21 DIAGNOSIS — I2510 Atherosclerotic heart disease of native coronary artery without angina pectoris: Secondary | ICD-10-CM | POA: Diagnosis not present

## 2020-02-21 DIAGNOSIS — W19XXXD Unspecified fall, subsequent encounter: Secondary | ICD-10-CM | POA: Diagnosis not present

## 2020-02-21 DIAGNOSIS — I110 Hypertensive heart disease with heart failure: Secondary | ICD-10-CM | POA: Diagnosis not present

## 2020-02-22 DIAGNOSIS — Z7901 Long term (current) use of anticoagulants: Secondary | ICD-10-CM | POA: Diagnosis not present

## 2020-02-22 DIAGNOSIS — Z23 Encounter for immunization: Secondary | ICD-10-CM | POA: Diagnosis not present

## 2020-02-22 DIAGNOSIS — Z86718 Personal history of other venous thrombosis and embolism: Secondary | ICD-10-CM | POA: Diagnosis not present

## 2020-02-23 ENCOUNTER — Telehealth: Payer: Self-pay | Admitting: Family Medicine

## 2020-02-23 ENCOUNTER — Telehealth: Payer: Self-pay | Admitting: *Deleted

## 2020-02-23 DIAGNOSIS — F0151 Vascular dementia with behavioral disturbance: Secondary | ICD-10-CM | POA: Diagnosis not present

## 2020-02-23 DIAGNOSIS — Z86711 Personal history of pulmonary embolism: Secondary | ICD-10-CM

## 2020-02-23 DIAGNOSIS — I252 Old myocardial infarction: Secondary | ICD-10-CM | POA: Diagnosis not present

## 2020-02-23 DIAGNOSIS — L02416 Cutaneous abscess of left lower limb: Secondary | ICD-10-CM | POA: Diagnosis not present

## 2020-02-23 DIAGNOSIS — I110 Hypertensive heart disease with heart failure: Secondary | ICD-10-CM | POA: Diagnosis not present

## 2020-02-23 DIAGNOSIS — J439 Emphysema, unspecified: Secondary | ICD-10-CM | POA: Diagnosis not present

## 2020-02-23 DIAGNOSIS — S7011XD Contusion of right thigh, subsequent encounter: Secondary | ICD-10-CM | POA: Diagnosis not present

## 2020-02-23 DIAGNOSIS — D630 Anemia in neoplastic disease: Secondary | ICD-10-CM | POA: Diagnosis not present

## 2020-02-23 DIAGNOSIS — W19XXXD Unspecified fall, subsequent encounter: Secondary | ICD-10-CM | POA: Diagnosis not present

## 2020-02-23 DIAGNOSIS — G8222 Paraplegia, incomplete: Secondary | ICD-10-CM | POA: Diagnosis not present

## 2020-02-23 DIAGNOSIS — Z86718 Personal history of other venous thrombosis and embolism: Secondary | ICD-10-CM

## 2020-02-23 DIAGNOSIS — C3412 Malignant neoplasm of upper lobe, left bronchus or lung: Secondary | ICD-10-CM | POA: Diagnosis not present

## 2020-02-23 DIAGNOSIS — I255 Ischemic cardiomyopathy: Secondary | ICD-10-CM | POA: Diagnosis not present

## 2020-02-23 DIAGNOSIS — I2510 Atherosclerotic heart disease of native coronary artery without angina pectoris: Secondary | ICD-10-CM | POA: Diagnosis not present

## 2020-02-23 DIAGNOSIS — I509 Heart failure, unspecified: Secondary | ICD-10-CM | POA: Diagnosis not present

## 2020-02-23 NOTE — Telephone Encounter (Signed)
Fax received mdINR PT/INR self testing service Test date/time 02/22/20 6:07 am INR 2.2

## 2020-02-23 NOTE — Telephone Encounter (Signed)
Pt's wife called to let Dr Dettinger know that he recently had his 1st COVID Vaccine and says since then, pt has been feeling very fatigued and a little congested. Wants advice on what pt can do to feel better.

## 2020-02-23 NOTE — Telephone Encounter (Signed)
Description   continue to take take 2 mg every Tuesday, Thursday and Saturday and then continue on 3 mg every other day of the week  INR 2.2  (goal is 2-3)  Recheck in 1 to 2 weeks        Caryl Pina, MD [facility] 02/23/2020, 9:16 PM

## 2020-02-23 NOTE — Telephone Encounter (Signed)
For him he can take Tylenol and he can use Mucinex and he can use Flonase, the energy usually stays down for a few days at most and then should improve.  Unfortunately with his blood thinner he cannot take ibuprofen.

## 2020-02-24 NOTE — Telephone Encounter (Signed)
Aware. 

## 2020-02-24 NOTE — Telephone Encounter (Signed)
Patient aware and verbalized understanding. °

## 2020-02-27 ENCOUNTER — Encounter: Payer: Self-pay | Admitting: Family Medicine

## 2020-02-27 ENCOUNTER — Other Ambulatory Visit: Payer: Self-pay

## 2020-02-27 ENCOUNTER — Emergency Department (HOSPITAL_COMMUNITY): Payer: BC Managed Care – PPO

## 2020-02-27 ENCOUNTER — Encounter (HOSPITAL_COMMUNITY): Payer: Self-pay | Admitting: Emergency Medicine

## 2020-02-27 ENCOUNTER — Ambulatory Visit (INDEPENDENT_AMBULATORY_CARE_PROVIDER_SITE_OTHER): Payer: BC Managed Care – PPO | Admitting: Family Medicine

## 2020-02-27 ENCOUNTER — Inpatient Hospital Stay (HOSPITAL_COMMUNITY)
Admission: EM | Admit: 2020-02-27 | Discharge: 2020-03-09 | DRG: 193 | Disposition: A | Discharge status: Skilled Nursing Facility | Payer: BC Managed Care – PPO | Attending: Internal Medicine | Admitting: Internal Medicine

## 2020-02-27 VITALS — Temp 97.6°F

## 2020-02-27 DIAGNOSIS — I5032 Chronic diastolic (congestive) heart failure: Secondary | ICD-10-CM | POA: Diagnosis not present

## 2020-02-27 DIAGNOSIS — Z923 Personal history of irradiation: Secondary | ICD-10-CM

## 2020-02-27 DIAGNOSIS — E860 Dehydration: Secondary | ICD-10-CM | POA: Diagnosis not present

## 2020-02-27 DIAGNOSIS — J411 Mucopurulent chronic bronchitis: Secondary | ICD-10-CM | POA: Diagnosis not present

## 2020-02-27 DIAGNOSIS — Z87891 Personal history of nicotine dependence: Secondary | ICD-10-CM | POA: Diagnosis not present

## 2020-02-27 DIAGNOSIS — J441 Chronic obstructive pulmonary disease with (acute) exacerbation: Secondary | ICD-10-CM | POA: Diagnosis not present

## 2020-02-27 DIAGNOSIS — I8291 Chronic embolism and thrombosis of unspecified vein: Secondary | ICD-10-CM | POA: Diagnosis not present

## 2020-02-27 DIAGNOSIS — J189 Pneumonia, unspecified organism: Principal | ICD-10-CM | POA: Diagnosis present

## 2020-02-27 DIAGNOSIS — R410 Disorientation, unspecified: Secondary | ICD-10-CM | POA: Diagnosis not present

## 2020-02-27 DIAGNOSIS — R52 Pain, unspecified: Secondary | ICD-10-CM

## 2020-02-27 DIAGNOSIS — J432 Centrilobular emphysema: Secondary | ICD-10-CM | POA: Diagnosis not present

## 2020-02-27 DIAGNOSIS — R58 Hemorrhage, not elsewhere classified: Secondary | ICD-10-CM | POA: Diagnosis present

## 2020-02-27 DIAGNOSIS — R918 Other nonspecific abnormal finding of lung field: Secondary | ICD-10-CM | POA: Diagnosis not present

## 2020-02-27 DIAGNOSIS — C3411 Malignant neoplasm of upper lobe, right bronchus or lung: Secondary | ICD-10-CM | POA: Diagnosis not present

## 2020-02-27 DIAGNOSIS — T45515A Adverse effect of anticoagulants, initial encounter: Secondary | ICD-10-CM | POA: Diagnosis present

## 2020-02-27 DIAGNOSIS — R059 Cough, unspecified: Secondary | ICD-10-CM

## 2020-02-27 DIAGNOSIS — I252 Old myocardial infarction: Secondary | ICD-10-CM | POA: Diagnosis not present

## 2020-02-27 DIAGNOSIS — M2550 Pain in unspecified joint: Secondary | ICD-10-CM | POA: Diagnosis not present

## 2020-02-27 DIAGNOSIS — I5042 Chronic combined systolic (congestive) and diastolic (congestive) heart failure: Secondary | ICD-10-CM | POA: Diagnosis present

## 2020-02-27 DIAGNOSIS — F0150 Vascular dementia without behavioral disturbance: Secondary | ICD-10-CM | POA: Diagnosis not present

## 2020-02-27 DIAGNOSIS — I10 Essential (primary) hypertension: Secondary | ICD-10-CM | POA: Diagnosis present

## 2020-02-27 DIAGNOSIS — Z8673 Personal history of transient ischemic attack (TIA), and cerebral infarction without residual deficits: Secondary | ICD-10-CM

## 2020-02-27 DIAGNOSIS — I2510 Atherosclerotic heart disease of native coronary artery without angina pectoris: Secondary | ICD-10-CM | POA: Diagnosis not present

## 2020-02-27 DIAGNOSIS — I428 Other cardiomyopathies: Secondary | ICD-10-CM | POA: Diagnosis present

## 2020-02-27 DIAGNOSIS — Z86711 Personal history of pulmonary embolism: Secondary | ICD-10-CM | POA: Diagnosis present

## 2020-02-27 DIAGNOSIS — D6832 Hemorrhagic disorder due to extrinsic circulating anticoagulants: Secondary | ICD-10-CM | POA: Diagnosis not present

## 2020-02-27 DIAGNOSIS — S0990XA Unspecified injury of head, initial encounter: Secondary | ICD-10-CM | POA: Diagnosis not present

## 2020-02-27 DIAGNOSIS — M25562 Pain in left knee: Secondary | ICD-10-CM | POA: Diagnosis not present

## 2020-02-27 DIAGNOSIS — J9621 Acute and chronic respiratory failure with hypoxia: Secondary | ICD-10-CM | POA: Diagnosis not present

## 2020-02-27 DIAGNOSIS — Z801 Family history of malignant neoplasm of trachea, bronchus and lung: Secondary | ICD-10-CM

## 2020-02-27 DIAGNOSIS — Z20822 Contact with and (suspected) exposure to covid-19: Secondary | ICD-10-CM | POA: Diagnosis present

## 2020-02-27 DIAGNOSIS — J439 Emphysema, unspecified: Secondary | ICD-10-CM | POA: Diagnosis not present

## 2020-02-27 DIAGNOSIS — F0390 Unspecified dementia without behavioral disturbance: Secondary | ICD-10-CM | POA: Diagnosis not present

## 2020-02-27 DIAGNOSIS — R0602 Shortness of breath: Secondary | ICD-10-CM | POA: Diagnosis not present

## 2020-02-27 DIAGNOSIS — J9601 Acute respiratory failure with hypoxia: Secondary | ICD-10-CM | POA: Diagnosis present

## 2020-02-27 DIAGNOSIS — Z8674 Personal history of sudden cardiac arrest: Secondary | ICD-10-CM

## 2020-02-27 DIAGNOSIS — M1712 Unilateral primary osteoarthritis, left knee: Secondary | ICD-10-CM | POA: Diagnosis present

## 2020-02-27 DIAGNOSIS — R1310 Dysphagia, unspecified: Secondary | ICD-10-CM | POA: Diagnosis not present

## 2020-02-27 DIAGNOSIS — I700 Atherosclerosis of aorta: Secondary | ICD-10-CM | POA: Diagnosis not present

## 2020-02-27 DIAGNOSIS — M4854XD Collapsed vertebra, not elsewhere classified, thoracic region, subsequent encounter for fracture with routine healing: Secondary | ICD-10-CM | POA: Diagnosis not present

## 2020-02-27 DIAGNOSIS — Z95810 Presence of automatic (implantable) cardiac defibrillator: Secondary | ICD-10-CM

## 2020-02-27 DIAGNOSIS — M6281 Muscle weakness (generalized): Secondary | ICD-10-CM | POA: Diagnosis not present

## 2020-02-27 DIAGNOSIS — I2699 Other pulmonary embolism without acute cor pulmonale: Secondary | ICD-10-CM | POA: Diagnosis not present

## 2020-02-27 DIAGNOSIS — R05 Cough: Secondary | ICD-10-CM | POA: Diagnosis not present

## 2020-02-27 DIAGNOSIS — Z9081 Acquired absence of spleen: Secondary | ICD-10-CM

## 2020-02-27 DIAGNOSIS — R197 Diarrhea, unspecified: Secondary | ICD-10-CM | POA: Diagnosis not present

## 2020-02-27 DIAGNOSIS — Z79899 Other long term (current) drug therapy: Secondary | ICD-10-CM

## 2020-02-27 DIAGNOSIS — F339 Major depressive disorder, recurrent, unspecified: Secondary | ICD-10-CM | POA: Diagnosis not present

## 2020-02-27 DIAGNOSIS — Z7901 Long term (current) use of anticoagulants: Secondary | ICD-10-CM

## 2020-02-27 DIAGNOSIS — R41841 Cognitive communication deficit: Secondary | ICD-10-CM | POA: Diagnosis not present

## 2020-02-27 DIAGNOSIS — J449 Chronic obstructive pulmonary disease, unspecified: Secondary | ICD-10-CM | POA: Diagnosis not present

## 2020-02-27 DIAGNOSIS — R1313 Dysphagia, pharyngeal phase: Secondary | ICD-10-CM | POA: Diagnosis not present

## 2020-02-27 DIAGNOSIS — Z7401 Bed confinement status: Secondary | ICD-10-CM | POA: Diagnosis not present

## 2020-02-27 DIAGNOSIS — R0600 Dyspnea, unspecified: Secondary | ICD-10-CM

## 2020-02-27 DIAGNOSIS — I110 Hypertensive heart disease with heart failure: Secondary | ICD-10-CM | POA: Diagnosis not present

## 2020-02-27 DIAGNOSIS — R4182 Altered mental status, unspecified: Secondary | ICD-10-CM | POA: Diagnosis not present

## 2020-02-27 DIAGNOSIS — R262 Difficulty in walking, not elsewhere classified: Secondary | ICD-10-CM | POA: Diagnosis not present

## 2020-02-27 DIAGNOSIS — R609 Edema, unspecified: Secondary | ICD-10-CM

## 2020-02-27 DIAGNOSIS — Z8249 Family history of ischemic heart disease and other diseases of the circulatory system: Secondary | ICD-10-CM

## 2020-02-27 LAB — TROPONIN I (HIGH SENSITIVITY)
Troponin I (High Sensitivity): 12 ng/L (ref <18)
Troponin I (High Sensitivity): 13 ng/L (ref <18)

## 2020-02-27 LAB — BASIC METABOLIC PANEL
Anion gap: 9 (ref 5–15)
BUN: 13 mg/dL (ref 8–23)
CO2: 29 mmol/L (ref 22–32)
Calcium: 9.6 mg/dL (ref 8.9–10.3)
Chloride: 103 mmol/L (ref 98–111)
Creatinine, Ser: 0.79 mg/dL (ref 0.61–1.24)
GFR calc Af Amer: >60 mL/min (ref >60)
GFR calc non Af Amer: >60 mL/min (ref >60)
Glucose, Bld: 117 mg/dL — ABNORMAL HIGH (ref 70–99)
Potassium: 3.6 mmol/L (ref 3.5–5.1)
Sodium: 141 mmol/L (ref 135–145)

## 2020-02-27 LAB — CBC
HCT: 51.5 % (ref 39.0–52.0)
HCT: 48.5 % (ref 39.0–52.0)
Hemoglobin: 16.4 g/dL (ref 13.0–17.0)
Hemoglobin: 15.4 g/dL (ref 13.0–17.0)
MCH: 28 pg (ref 26.0–34.0)
MCH: 28.1 pg (ref 26.0–34.0)
MCHC: 31.8 g/dL (ref 30.0–36.0)
MCHC: 31.8 g/dL (ref 30.0–36.0)
MCV: 87.9 fL (ref 80.0–100.0)
MCV: 88.3 fL (ref 80.0–100.0)
Platelets: 294 10*3/uL (ref 150–400)
Platelets: 290 10*3/uL (ref 150–400)
RBC: 5.86 MIL/uL — ABNORMAL HIGH (ref 4.22–5.81)
RBC: 5.49 MIL/uL (ref 4.22–5.81)
RDW: 18 % — ABNORMAL HIGH (ref 11.5–15.5)
RDW: 17.6 % — ABNORMAL HIGH (ref 11.5–15.5)
WBC: 13.8 10*3/uL — ABNORMAL HIGH (ref 4.0–10.5)
WBC: 15.6 10*3/uL — ABNORMAL HIGH (ref 4.0–10.5)
nRBC: 0 % (ref 0.0–0.2)
nRBC: 0 % (ref 0.0–0.2)

## 2020-02-27 LAB — POCT I-STAT EG7
Acid-Base Excess: 7 mmol/L — ABNORMAL HIGH (ref 0.0–2.0)
Bicarbonate: 31.6 mmol/L — ABNORMAL HIGH (ref 20.0–28.0)
Calcium, Ion: 1 mmol/L — ABNORMAL LOW (ref 1.15–1.40)
HCT: 55 % — ABNORMAL HIGH (ref 39.0–52.0)
Hemoglobin: 18.7 g/dL — ABNORMAL HIGH (ref 13.0–17.0)
O2 Saturation: 83 %
Potassium: 4.8 mmol/L (ref 3.5–5.1)
Sodium: 139 mmol/L (ref 135–145)
TCO2: 33 mmol/L — ABNORMAL HIGH (ref 22–32)
pCO2, Ven: 41.4 mmHg — ABNORMAL LOW (ref 44.0–60.0)
pH, Ven: 7.491 — ABNORMAL HIGH (ref 7.250–7.430)
pO2, Ven: 43 mmHg (ref 32.0–45.0)

## 2020-02-27 LAB — URINALYSIS, ROUTINE W REFLEX MICROSCOPIC
Bacteria, UA: NONE SEEN
Bilirubin Urine: NEGATIVE
Glucose, UA: NEGATIVE mg/dL
Hgb urine dipstick: NEGATIVE
Ketones, ur: NEGATIVE mg/dL
Nitrite: NEGATIVE
Protein, ur: NEGATIVE mg/dL
Specific Gravity, Urine: 1.017 (ref 1.005–1.030)
pH: 5 (ref 5.0–8.0)

## 2020-02-27 LAB — CREATININE, SERUM
Creatinine, Ser: 0.88 mg/dL (ref 0.61–1.24)
GFR calc Af Amer: >60 mL/min (ref >60)
GFR calc non Af Amer: >60 mL/min (ref >60)

## 2020-02-27 LAB — PROTIME-INR
INR: 2 — ABNORMAL HIGH (ref 0.8–1.2)
Prothrombin Time: 22.6 seconds — ABNORMAL HIGH (ref 11.4–15.2)

## 2020-02-27 LAB — POC SARS CORONAVIRUS 2 AG -  ED: SARS Coronavirus 2 Ag: NEGATIVE

## 2020-02-27 LAB — CLOSTRIDIUM DIFFICILE BY PCR, REFLEXED: Toxigenic C. Difficile by PCR: NEGATIVE

## 2020-02-27 LAB — C DIFFICILE QUICK SCREEN W PCR REFLEX
C Diff antigen: POSITIVE — AB
C Diff toxin: NEGATIVE

## 2020-02-27 MED ORDER — SERTRALINE HCL 50 MG PO TABS
50.0000 mg | ORAL_TABLET | Freq: Every day | ORAL | Status: DC
  Administered 2020-02-28 – 2020-03-09 (×11): 50 mg via ORAL
  Filled 2020-02-27 (×11): qty 1

## 2020-02-27 MED ORDER — SODIUM CHLORIDE 0.9 % IV SOLN
2.0000 g | Freq: Once | INTRAVENOUS | Status: AC
  Administered 2020-02-27: 2 g via INTRAVENOUS
  Filled 2020-02-27: qty 2

## 2020-02-27 MED ORDER — WARFARIN - PHARMACIST DOSING INPATIENT: Freq: Every day | Status: DC

## 2020-02-27 MED ORDER — ONDANSETRON HCL 4 MG PO TABS: 4.0000 mg | ORAL_TABLET | Freq: Four times a day (QID) | ORAL | Status: DC | PRN

## 2020-02-27 MED ORDER — ATORVASTATIN CALCIUM 80 MG PO TABS
80.0000 mg | ORAL_TABLET | ORAL | Status: DC
  Administered 2020-02-29 – 2020-03-09 (×5): 80 mg via ORAL
  Filled 2020-02-27 (×7): qty 1

## 2020-02-27 MED ORDER — UMECLIDINIUM BROMIDE 62.5 MCG/INH IN AEPB
1.0000 | INHALATION_SPRAY | Freq: Every day | RESPIRATORY_TRACT | Status: DC
  Administered 2020-02-28 – 2020-03-05 (×7): 1 via RESPIRATORY_TRACT
  Filled 2020-02-27: qty 7

## 2020-02-27 MED ORDER — VANCOMYCIN HCL 2000 MG/400ML IV SOLN
2000.0000 mg | Freq: Once | INTRAVENOUS | Status: AC
  Administered 2020-02-27: 2000 mg via INTRAVENOUS
  Filled 2020-02-27: qty 400

## 2020-02-27 MED ORDER — MIRABEGRON ER 25 MG PO TB24
50.0000 mg | ORAL_TABLET | Freq: Every evening | ORAL | Status: DC
  Administered 2020-02-28 – 2020-03-08 (×10): 50 mg via ORAL
  Filled 2020-02-27 (×5): qty 2
  Filled 2020-02-27: qty 1
  Filled 2020-02-27 (×5): qty 2

## 2020-02-27 MED ORDER — SODIUM CHLORIDE 0.9 % IV SOLN
500.0000 mg | INTRAVENOUS | Status: AC
  Administered 2020-02-27 – 2020-03-01 (×4): 500 mg via INTRAVENOUS
  Filled 2020-02-27 (×4): qty 500

## 2020-02-27 MED ORDER — ALBUTEROL SULFATE HFA 108 (90 BASE) MCG/ACT IN AERS
6.0000 | INHALATION_SPRAY | Freq: Once | RESPIRATORY_TRACT | Status: AC
  Administered 2020-02-27: 6 via RESPIRATORY_TRACT
  Filled 2020-02-27: qty 6.7

## 2020-02-27 MED ORDER — ONDANSETRON HCL 4 MG/2ML IJ SOLN: 4.0000 mg | Freq: Four times a day (QID) | INTRAMUSCULAR | Status: DC | PRN

## 2020-02-27 MED ORDER — LACTATED RINGERS IV BOLUS
500.0000 mL | Freq: Once | INTRAVENOUS | Status: AC
  Administered 2020-02-27: 500 mL via INTRAVENOUS

## 2020-02-27 MED ORDER — METOPROLOL TARTRATE 25 MG PO TABS
37.5000 mg | ORAL_TABLET | Freq: Two times a day (BID) | ORAL | Status: DC
  Administered 2020-02-28 – 2020-03-09 (×22): 37.5 mg via ORAL
  Filled 2020-02-27 (×22): qty 1

## 2020-02-27 MED ORDER — ENOXAPARIN SODIUM 40 MG/0.4ML ~~LOC~~ SOLN: 40.0000 mg | SUBCUTANEOUS | Status: DC

## 2020-02-27 MED ORDER — FINASTERIDE 5 MG PO TABS
5.0000 mg | ORAL_TABLET | Freq: Every day | ORAL | Status: DC
  Administered 2020-02-28 – 2020-03-09 (×11): 5 mg via ORAL
  Filled 2020-02-27 (×11): qty 1

## 2020-02-27 MED ORDER — TAMSULOSIN HCL 0.4 MG PO CAPS
0.4000 mg | ORAL_CAPSULE | Freq: Every day | ORAL | Status: DC
  Administered 2020-02-28 – 2020-03-08 (×10): 0.4 mg via ORAL
  Filled 2020-02-27 (×10): qty 1

## 2020-02-27 MED ORDER — ACETAMINOPHEN 325 MG PO TABS
650.0000 mg | ORAL_TABLET | Freq: Four times a day (QID) | ORAL | Status: DC | PRN
  Administered 2020-03-07: 650 mg via ORAL
  Filled 2020-02-27: qty 2

## 2020-02-27 MED ORDER — SODIUM CHLORIDE 0.9% FLUSH
3.0000 mL | Freq: Once | INTRAVENOUS | Status: AC
  Administered 2020-02-27: 3 mL via INTRAVENOUS

## 2020-02-27 MED ORDER — WARFARIN SODIUM 3 MG PO TABS
3.0000 mg | ORAL_TABLET | Freq: Once | ORAL | Status: AC
  Administered 2020-02-28: 3 mg via ORAL
  Filled 2020-02-27 (×2): qty 1

## 2020-02-27 MED ORDER — IPRATROPIUM BROMIDE HFA 17 MCG/ACT IN AERS
2.0000 | INHALATION_SPRAY | Freq: Once | RESPIRATORY_TRACT | Status: AC
  Administered 2020-02-27: 2 via RESPIRATORY_TRACT
  Filled 2020-02-27: qty 12.9

## 2020-02-27 MED ORDER — ACETAMINOPHEN 650 MG RE SUPP: 650.0000 mg | Freq: Four times a day (QID) | RECTAL | Status: DC | PRN

## 2020-02-27 MED ORDER — SODIUM CHLORIDE 0.9 % IV SOLN
2.0000 g | INTRAVENOUS | Status: AC
  Administered 2020-02-28 – 2020-03-03 (×5): 2 g via INTRAVENOUS
  Filled 2020-02-27: qty 2
  Filled 2020-02-27: qty 20
  Filled 2020-02-27 (×3): qty 2
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
## 2020-02-27 NOTE — ED Notes (Signed)
Additional PIV initiated, 20G to [place]. IV flushes with 10 cc NS without s/s of infiltration. Secured with tape and tegaderm. Second set of cultures drawn, labeled with 2 pt identifiers, and sent to lab

## 2020-02-27 NOTE — ED Notes (Signed)
Assumed care of pt. Pt taken to CT scan in NAD
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
## 2020-02-27 NOTE — ED Triage Notes (Signed)
Pt arrives pov with wife from home with a c/o of cough and diarrhea. Pt had virtual visit today and was sent to ER to r/o covid along with dehydration and increase in confusion. Pt does have dementia and wife is with pt.

## 2020-02-27 NOTE — ED Notes (Signed)
Report given to Aimee, RN. All questions answered

## 2020-02-27 NOTE — ED Notes (Signed)
POC covid result: negative

## 2020-02-27 NOTE — ED Notes (Signed)
ED Provider at bedside. 

## 2020-02-27 NOTE — ED Notes (Signed)
Awaiting PO meds from pharmacy

## 2020-02-27 NOTE — ED Notes (Signed)
Lab called.  Will add on urine legionella and strep to urine already in lab
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
## 2020-02-27 NOTE — ED Notes (Signed)
Cefepime and vancomycin initiated per MAR. Name/DOB verified with pt

## 2020-02-27 NOTE — ED Notes (Signed)
Helped Pt to bathroom. Pt could not stand without assistance from Pt's Wife and I. Pt had another diarrhea bowel movement

## 2020-02-28 DIAGNOSIS — F0390 Unspecified dementia without behavioral disturbance: Secondary | ICD-10-CM | POA: Diagnosis not present

## 2020-02-28 DIAGNOSIS — Z8673 Personal history of transient ischemic attack (TIA), and cerebral infarction without residual deficits: Secondary | ICD-10-CM | POA: Diagnosis not present

## 2020-02-28 DIAGNOSIS — J189 Pneumonia, unspecified organism: Secondary | ICD-10-CM | POA: Diagnosis present

## 2020-02-28 DIAGNOSIS — F0150 Vascular dementia without behavioral disturbance: Secondary | ICD-10-CM | POA: Diagnosis present

## 2020-02-28 DIAGNOSIS — M1712 Unilateral primary osteoarthritis, left knee: Secondary | ICD-10-CM | POA: Diagnosis present

## 2020-02-28 DIAGNOSIS — J411 Mucopurulent chronic bronchitis: Secondary | ICD-10-CM | POA: Diagnosis not present

## 2020-02-28 DIAGNOSIS — T45515A Adverse effect of anticoagulants, initial encounter: Secondary | ICD-10-CM | POA: Diagnosis present

## 2020-02-28 DIAGNOSIS — I5032 Chronic diastolic (congestive) heart failure: Secondary | ICD-10-CM | POA: Diagnosis not present

## 2020-02-28 DIAGNOSIS — Z20822 Contact with and (suspected) exposure to covid-19: Secondary | ICD-10-CM | POA: Diagnosis present

## 2020-02-28 DIAGNOSIS — R197 Diarrhea, unspecified: Secondary | ICD-10-CM | POA: Diagnosis present

## 2020-02-28 DIAGNOSIS — J9621 Acute and chronic respiratory failure with hypoxia: Secondary | ICD-10-CM | POA: Diagnosis present

## 2020-02-28 DIAGNOSIS — E860 Dehydration: Secondary | ICD-10-CM | POA: Diagnosis present

## 2020-02-28 DIAGNOSIS — I428 Other cardiomyopathies: Secondary | ICD-10-CM | POA: Diagnosis present

## 2020-02-28 DIAGNOSIS — I2699 Other pulmonary embolism without acute cor pulmonale: Secondary | ICD-10-CM | POA: Diagnosis present

## 2020-02-28 DIAGNOSIS — J439 Emphysema, unspecified: Secondary | ICD-10-CM | POA: Diagnosis present

## 2020-02-28 DIAGNOSIS — Z8674 Personal history of sudden cardiac arrest: Secondary | ICD-10-CM | POA: Diagnosis not present

## 2020-02-28 DIAGNOSIS — I252 Old myocardial infarction: Secondary | ICD-10-CM | POA: Diagnosis not present

## 2020-02-28 DIAGNOSIS — I110 Hypertensive heart disease with heart failure: Secondary | ICD-10-CM | POA: Diagnosis present

## 2020-02-28 DIAGNOSIS — C3411 Malignant neoplasm of upper lobe, right bronchus or lung: Secondary | ICD-10-CM | POA: Diagnosis present

## 2020-02-28 DIAGNOSIS — I5042 Chronic combined systolic (congestive) and diastolic (congestive) heart failure: Secondary | ICD-10-CM | POA: Diagnosis present

## 2020-02-28 DIAGNOSIS — Z801 Family history of malignant neoplasm of trachea, bronchus and lung: Secondary | ICD-10-CM | POA: Diagnosis not present

## 2020-02-28 DIAGNOSIS — J188 Other pneumonia, unspecified organism: Secondary | ICD-10-CM | POA: Diagnosis present

## 2020-02-28 DIAGNOSIS — J441 Chronic obstructive pulmonary disease with (acute) exacerbation: Secondary | ICD-10-CM | POA: Diagnosis not present

## 2020-02-28 DIAGNOSIS — I2510 Atherosclerotic heart disease of native coronary artery without angina pectoris: Secondary | ICD-10-CM | POA: Diagnosis present

## 2020-02-28 DIAGNOSIS — Z87891 Personal history of nicotine dependence: Secondary | ICD-10-CM | POA: Diagnosis not present

## 2020-02-28 DIAGNOSIS — Z86711 Personal history of pulmonary embolism: Secondary | ICD-10-CM | POA: Diagnosis not present

## 2020-02-28 DIAGNOSIS — R58 Hemorrhage, not elsewhere classified: Secondary | ICD-10-CM | POA: Diagnosis present

## 2020-02-28 DIAGNOSIS — R1310 Dysphagia, unspecified: Secondary | ICD-10-CM | POA: Diagnosis present

## 2020-02-28 DIAGNOSIS — J9601 Acute respiratory failure with hypoxia: Secondary | ICD-10-CM | POA: Diagnosis not present

## 2020-02-28 DIAGNOSIS — D6832 Hemorrhagic disorder due to extrinsic circulating anticoagulants: Secondary | ICD-10-CM | POA: Diagnosis present

## 2020-02-28 LAB — CBC
HCT: 47.9 % (ref 39.0–52.0)
Hemoglobin: 15.3 g/dL (ref 13.0–17.0)
MCH: 28.2 pg (ref 26.0–34.0)
MCHC: 31.9 g/dL (ref 30.0–36.0)
MCV: 88.2 fL (ref 80.0–100.0)
Platelets: 268 10*3/uL (ref 150–400)
RBC: 5.43 MIL/uL (ref 4.22–5.81)
RDW: 17.5 % — ABNORMAL HIGH (ref 11.5–15.5)
WBC: 15 10*3/uL — ABNORMAL HIGH (ref 4.0–10.5)
nRBC: 0 % (ref 0.0–0.2)

## 2020-02-28 LAB — PROTIME-INR
INR: 2.1 — ABNORMAL HIGH (ref 0.8–1.2)
Prothrombin Time: 23.6 seconds — ABNORMAL HIGH (ref 11.4–15.2)

## 2020-02-28 LAB — BASIC METABOLIC PANEL
Anion gap: 10 (ref 5–15)
BUN: 14 mg/dL (ref 8–23)
CO2: 25 mmol/L (ref 22–32)
Calcium: 8.9 mg/dL (ref 8.9–10.3)
Chloride: 107 mmol/L (ref 98–111)
Creatinine, Ser: 0.72 mg/dL (ref 0.61–1.24)
GFR calc Af Amer: >60 mL/min (ref >60)
GFR calc non Af Amer: >60 mL/min (ref >60)
Glucose, Bld: 107 mg/dL — ABNORMAL HIGH (ref 70–99)
Potassium: 3.6 mmol/L (ref 3.5–5.1)
Sodium: 142 mmol/L (ref 135–145)

## 2020-02-28 LAB — STREP PNEUMONIAE URINARY ANTIGEN: Strep Pneumo Urinary Antigen: NEGATIVE

## 2020-02-28 LAB — HIV ANTIBODY (ROUTINE TESTING W REFLEX): HIV Screen 4th Generation wRfx: NONREACTIVE

## 2020-02-28 LAB — TROPONIN I (HIGH SENSITIVITY)
Troponin I (High Sensitivity): 17 ng/L (ref <18)
Troponin I (High Sensitivity): 12 ng/L (ref <18)

## 2020-02-28 LAB — MAGNESIUM: Magnesium: 1.7 mg/dL (ref 1.7–2.4)

## 2020-02-28 LAB — SARS CORONAVIRUS 2 (TAT 6-24 HRS): SARS Coronavirus 2: NEGATIVE

## 2020-02-28 MED ORDER — CALCIUM GLUCONATE-NACL 1-0.675 GM/50ML-% IV SOLN
1.0000 g | Freq: Once | INTRAVENOUS | Status: AC
  Administered 2020-02-28: 1000 mg via INTRAVENOUS
  Filled 2020-02-28: qty 50

## 2020-02-28 MED ORDER — ALBUTEROL SULFATE (2.5 MG/3ML) 0.083% IN NEBU: 2.5000 mg | INHALATION_SOLUTION | RESPIRATORY_TRACT | Status: DC | PRN

## 2020-02-28 MED ORDER — WARFARIN SODIUM 2 MG PO TABS
2.0000 mg | ORAL_TABLET | Freq: Once | ORAL | Status: AC
  Administered 2020-02-28: 2 mg via ORAL
  Filled 2020-02-28: qty 1

## 2020-02-28 MED ORDER — RISAQUAD PO CAPS
1.0000 | ORAL_CAPSULE | Freq: Every day | ORAL | Status: DC
  Administered 2020-02-28 – 2020-03-09 (×11): 1 via ORAL
  Filled 2020-02-28 (×10): qty 1

## 2020-02-28 NOTE — Evaluation (Signed)
Physical Therapy Evaluation Patient Details Name: Travis Cruz MRN: [id number] DOB: 1946/01/27 Today's Date: 02/28/2020   History of Present Illness  74 yo male admitted to ED on 3/1 with persistent coughing, diarrhea, acute respiratory failure with hypoxia. CXR reveals multifocal pulmonary opacities, some of which may be chronic,although superimposed pneumonia is suspected. PMH includes dementia, anemia, chronic T6 and T10 compression deformities, ARDS, AICD, CAD with history of MI and cardiac cath, COPD emphysema type, DVT, HTN, cardiomyopathy, lung cancer, OSA, CVA.  Clinical Impression   Pt presents with generalized weakness, impaired sitting and standing balance, decreased activity tolerance, history of recurrent falls cognitive impairment secondary to dementia. Pt to benefit from acute PT to address deficits. Pt required min to mod assist for bed mobility and transfers to and from [facility], pt able to take small pivotal steps with much effort and dyspnea on exertion. Pt required pericare assist after BM. Pt required 3LO2 to maintain sats >88%, sats on 3LO2 92% and above. PT recommending SNF level of care post-acutely to address pt deficits. PT to progress mobility as tolerated, and will continue to follow acutely.      Follow Up Recommendations SNF;Supervision/Assistance - 24 hour    Equipment Recommendations  Other (comment)(defer)    Recommendations for Other Services       Precautions / Restrictions Precautions Precautions: Fall;ICD/Pacemaker Restrictions Weight Bearing Restrictions: No      Mobility  Bed Mobility Overal bed mobility: Needs Assistance Bed Mobility: Rolling;Supine to Sit;Sit to Supine Rolling: Min assist   Supine to sit: Min assist;HOB elevated Sit to supine: Min assist;HOB elevated   General bed mobility comments: Min assist for supine<>sit for light trunk and LE management, scooting to and from EOB, and positioning in bed with use of bed  pad.  Transfers Overall transfer level: Needs assistance Equipment used: Rolling walker (2 wheeled) Transfers: Sit to/from [facility] Sit to Stand: Mod assist;From elevated surface Stand pivot transfers: Mod assist;From elevated surface       General transfer comment: Sit to stand x3 (from EOB and BSC x2) with mod assist for initial power up, steadying. Pt with multiple small boosts to initially power up. Pt able to take small pivotal steps to [facility] with mod assist for steadying, guiding pt and RW. VERY narrow BOS with stepping, fatigues quickly. SpO2 86% with DOE 3/4 on 2LO2, increased to 3LO2 via [place].  Ambulation/Gait             General Gait Details: unable to attempt this session, pt fatigued from transfers  Stairs            Wheelchair Mobility    Modified Rankin (Stroke Patients Only)       Balance Overall balance assessment: Needs assistance Sitting-balance support: No upper extremity supported;Feet supported Sitting balance-Leahy Scale: Poor Sitting balance - Comments: posterior leaning with fatigue and when LEs not supported, corrected with min assist from PT. Postural control: Posterior lean Standing balance support: Bilateral upper extremity supported;During functional activity Standing balance-Leahy Scale: Poor Standing balance comment: reliant on external support in standing                             Pertinent Vitals/Pain Pain Assessment: No/denies pain    Home Living Family/patient expects to be discharged to:: Private residence Living Arrangements: Spouse/significant other Available Help at Discharge: Family;Available 24 hours/day Type of Home: House Home Access: Ramped entrance     Home Layout: One level  Home Equipment: [place] - 2 wheels;Wheelchair - manual;Bedside commode;Walker - 4 wheels      Prior Function Level of Independence: Needs assistance   Gait / Transfers Assistance Needed: Pt reports he is not  walking much lately, but uses rollator when he is walking. Pt reports use of supplemental O2 up to 3L.  ADL's / Homemaking Assistance Needed: Pt reports he needs assist with dressing, bathing, cooking/cleaning        Hand Dominance   Dominant Hand: Right    Extremity/Trunk Assessment   Upper Extremity Assessment Upper Extremity Assessment: Generalized weakness    Lower Extremity Assessment Lower Extremity Assessment: Generalized weakness    Cervical / Trunk Assessment Cervical / Trunk Assessment: Normal  Communication   Communication: No difficulties  Cognition Arousal/Alertness: Awake/alert Behavior During Therapy: WFL for tasks assessed/performed Overall Cognitive Status: History of cognitive impairments - at baseline                                 General Comments: A&O x3, pt thinks he is at [employer] and oriented to [facility]. pt with history of dementia. Pt follows one-step commands consistently, requires multimodal cuing for sequencing tasks. Increased processing time noted. Unsure of accuracy of pt history giving this session, at times gave opposing information.      General Comments General comments (skin integrity, edema, etc.): SpO2 92% and above on 3LO2 via [place], pt initially on 2LO2 upon PT arrival to room. DOE 3/4 with activity, + nonproductive coughing x4 during session.    Exercises     Assessment/Plan    PT Assessment Patient needs continued PT services  PT Problem List Decreased strength;Decreased mobility;Decreased safety awareness;Decreased range of motion;Decreased activity tolerance;Decreased cognition;Cardiopulmonary status limiting activity;Decreased balance;Decreased knowledge of use of DME;Pain       PT Treatment Interventions DME instruction;Therapeutic activities;Gait training;Therapeutic exercise;Patient/family education;Balance training;Functional mobility training;Neuromuscular re-education    PT Goals (Current goals can be found  in the Care Plan section)  Acute Rehab PT Goals Patient Stated Goal: feel better PT Goal Formulation: With patient Time For Goal Achievement: 03/13/20 Potential to Achieve Goals: Good    Frequency Min 2X/week   Barriers to discharge        Co-evaluation               AM-PAC PT "6 Clicks" Mobility  Outcome Measure Help needed turning from your back to your side while in a flat bed without using bedrails?: A Little Help needed moving from lying on your back to sitting on the side of a flat bed without using bedrails?: A Lot Help needed moving to and from a bed to a chair (including a wheelchair)?: A Lot Help needed standing up from a chair using your arms (e.g., wheelchair or bedside chair)?: A Lot Help needed to walk in hospital room?: Total Help needed climbing 3-5 steps with a railing? : Total 6 Click Score: 11    End of Session Equipment Utilized During Treatment: Gait belt;Oxygen Activity Tolerance: Patient tolerated treatment well;Patient limited by fatigue Patient left: in bed;with call bell/phone within reach;with bed alarm set Nurse Communication: Mobility status;Other (comment)(condom cath fell off, O2 requirement) PT Visit Diagnosis: Other abnormalities of gait and mobility (R26.89);Muscle weakness (generalized) (M62.81);Difficulty in walking, not elsewhere classified (R26.2)    Time: 9371-6967 PT Time Calculation (min) (ACUTE ONLY): 39 min   Charges:   PT Evaluation $PT Eval Low Complexity: 1 Low PT  Treatments $Therapeutic Activity: 8-22 mins $Self Care/Home Management: 8-22      [place] Pager [phone]  Office [phone]   Dove Gresham D Elonda Husky 02/28/2020, 12:09 PM

## 2020-02-28 NOTE — Plan of Care (Addendum)
Plan of care reviewed with pt. Medications given per orders. Pt had 8 beats of Vtach per tele, MD notified. Will continue to monitor pt. Pt up with assist, very unsteady. Call bell in reach. Alarms on. Pt stable at this time, will continue to monitor. CDIFF precautions continued. Wife updated over phone.  1822: MD paged to update wife. CPAP will be ordered for pt tonight. Pt has ICD on right side of chest.  Problem: Education: Goal: Knowledge of General Education information will improve Description: Including pain rating scale, medication(s)/side effects and non-pharmacologic comfort measures Outcome: Progressing   Problem: Health Behavior/Discharge Planning: Goal: Ability to manage health-related needs will improve Outcome: Progressing   Problem: Clinical Measurements: Goal: Ability to maintain clinical measurements within normal limits will improve Outcome: Progressing Goal: Will remain free from infection Outcome: Progressing Goal: Diagnostic test results will improve Outcome: Progressing Goal: Respiratory complications will improve Outcome: Progressing Goal: Cardiovascular complication will be avoided Outcome: Progressing   Problem: Activity: Goal: Risk for activity intolerance will decrease Outcome: Progressing   Problem: Nutrition: Goal: Adequate nutrition will be maintained Outcome: Progressing   Problem: Coping: Goal: Level of anxiety will decrease Outcome: Progressing   Problem: Elimination: Goal: Will not experience complications related to bowel motility Outcome: Progressing Goal: Will not experience complications related to urinary retention Outcome: Progressing   Problem: Pain Managment: Goal: General experience of comfort will improve Outcome: Progressing   Problem: Safety: Goal: Ability to remain free from injury will improve Outcome: Progressing   Problem: Skin Integrity: Goal: Risk for impaired skin integrity will decrease Outcome:  Progressing

## 2020-02-28 NOTE — Discharge Instructions (Signed)

## 2020-02-28 NOTE — Progress Notes (Signed)
Progress Note    Travis Cruz  QQI:[id number] DOB: 09-Feb-1946  DOA: 02/27/2020 PCP: Dettinger, Fransisca Kaufmann, MD    Brief Narrative:     Medical records reviewed and are as summarized below:  Travis Cruz is an 74 y.o. male with history of CAD, pulmonary embolism on Coumadin, COPD, vascular dementia, CHF has been experiencing productive cough for last 4 days unable to bring out phlegm with diarrhea last 2 days.  Was admitted in January 2021 for recurrent falls.  Per patient's wife patient had at least 10 episodes of diarrhea yesterday and about 4-5 episodes today.  Has not had any nausea vomiting.  Had called the primary care physician and since concern for dehydration patient was advised to come to the ER.  ED Course: In the ER patient chest x-ray shows multifocal infiltrates in the right side and had a CT scan which shows worsening infiltrates in the right side.  Patient was started on empiric antibiotics for pneumonia.  Was given 5 cc normal saline bolus for dehydration.  Labs show basic metabolic panel was unremarkable.  CBC shows leukocytosis of 15.6.  INR was 2 and patient is on Coumadin.  Stool studies were ordered and patient admitted for further management.   Assessment/Plan:   Principal Problem:   Acute respiratory failure with hypoxia (HCC) Active Problems:   COPD (chronic obstructive pulmonary disease) (HCC)   History of pulmonary embolus (PE)   History of CVA (cerebrovascular accident)   Chronic diastolic CHF (congestive heart failure) ([place])   Hypertension   Primary cancer of right upper lobe of lung ([place])   Diarrhea   CAP (community acquired pneumonia)    pneumonia  -Covid test was negative.   -Follow urine for Legionella strep antigen. -Patient will need follow-up since patient has had previous history of lung cancer. -Sputum culture ordered Flutter valve  Chronic respiratory failure -Wears 2 L at home  Diarrhea    -Patient states his diarrhea has  resolved -Clostridium difficile study showed that he is colonized but is not actively infected  Chronic diastolic CHF -holding Lasix due to possible dehydration from diarrhea. -Monitor fluid status  pulmonary embolism  --on Coumadin  --Coumadin will be dosed per pharmacy.  CAD  -denies any chest pain -Troponins negative -On metoprolol Coumadin statins.  COPD -Continue nebulizers and inhalers   Family Communication/Anticipated D/C date and plan/Code Status   DVT prophylaxis: On Coumadin Code Status: Full Code.  Family Communication: *None at bedside Disposition Plan: Suspect needs at least 24 if not 48 more hours in the hospital.  Patient currently appears to have increased work of breathing at rest so this will need to be monitored closely.   Medical Consultants:    None.     Subjective:   Says he no longer has diarrhea but does have a cough and is short of breath  Objective:    Vitals:   02/28/20 0005 02/28/20 0013 02/28/20 0756 02/28/20 1006  BP: 93/72 124/84 110/70 (!) 123/56  Pulse: (!) 111 (!) 113 98 (!) 110  Resp:   20   Temp:   98.9 F (37.2 C)   TempSrc:      SpO2: 95%  92% 93%    Intake/Output Summary (Last 24 hours) at 02/28/2020 1029 Last data filed at 02/28/2020 0500 Gross per 24 hour  Intake 0.42 ml  Output --  Net 0.42 ml   There were no vitals filed for this visit.  Exam: In bed, on 2 l of  oxygen, appears uncomfortable with increased work of breathing  coarse breath sounds and wet sounding cough Positive bowel sounds soft nontender Awake, alert and oriented    Data Reviewed:   I have personally reviewed following labs and imaging studies:  Labs: Labs show the following:   Basic Metabolic Panel: Recent Labs  Lab 02/27/20 1408 02/27/20 1408 02/27/20 1817 02/27/20 2240 02/28/20 0234  NA 141  --  139  --  142  K 3.6   < > 4.8  --  3.6  CL 103  --   --   --  107  CO2 29  --   --   --  25  GLUCOSE 117*  --   --   --  107*   BUN 13  --   --   --  14  CREATININE 0.79  --   --  0.88 0.72  CALCIUM 9.6  --   --   --  8.9   < > = values in this interval not displayed.   GFR CrCl cannot be calculated (Unknown ideal weight.). Liver Function Tests: No results for input(s): AST, ALT, ALKPHOS, BILITOT, PROT, ALBUMIN in the last 168 hours. No results for input(s): LIPASE, AMYLASE in the last 168 hours. No results for input(s): AMMONIA in the last 168 hours. Coagulation profile Recent Labs  Lab 02/27/20 1806 02/28/20 0234  INR 2.0* 2.1*    CBC: Recent Labs  Lab 02/27/20 1408 02/27/20 1817 02/27/20 2240 02/28/20 0234  WBC 13.8*  --  15.6* 15.0*  HGB 16.4 18.7* 15.4 15.3  HCT 51.5 55.0* 48.5 47.9  MCV 87.9  --  88.3 88.2  PLT 294  --  290 268   Cardiac Enzymes: No results for input(s): CKTOTAL, CKMB, CKMBINDEX, TROPONINI in the last 168 hours. BNP (last 3 results) No results for input(s): PROBNP in the last 8760 hours. CBG: No results for input(s): GLUCAP in the last 168 hours. D-Dimer: No results for input(s): DDIMER in the last 72 hours. Hgb A1c: No results for input(s): HGBA1C in the last 72 hours. Lipid Profile: No results for input(s): CHOL, HDL, LDLCALC, TRIG, CHOLHDL, LDLDIRECT in the last 72 hours. Thyroid function studies: No results for input(s): TSH, T4TOTAL, T3FREE, THYROIDAB in the last 72 hours.  Invalid input(s): FREET3 Anemia work up: No results for input(s): VITAMINB12, FOLATE, FERRITIN, TIBC, IRON, RETICCTPCT in the last 72 hours. Sepsis Labs: Recent Labs  Lab 02/27/20 1408 02/27/20 2240 02/28/20 0234  WBC 13.8* 15.6* 15.0*    Microbiology Recent Results (from the past 240 hour(s))  SARS CORONAVIRUS 2 (TAT 6-24 HRS) Nasopharyngeal Nasopharyngeal Swab     Status: None   Collection Time: 02/27/20  6:48 PM   Specimen: Nasopharyngeal Swab  Result Value Ref Range Status   SARS Coronavirus 2 NEGATIVE NEGATIVE Final    Comment: (NOTE) SARS-CoV-2 target nucleic acids are  NOT DETECTED. The SARS-CoV-2 RNA is generally detectable in upper and lower respiratory specimens during the acute phase of infection. Negative results do not preclude SARS-CoV-2 infection, do not rule out co-infections with other pathogens, and should not be used as the sole basis for treatment or other patient management decisions. Negative results must be combined with clinical observations, patient history, and epidemiological information. The expected result is Negative. Fact Sheet for Patients: SugarRoll.be Fact Sheet for Healthcare Providers: https://www.woods-mathews.com/ This test is not yet approved or cleared by the [place] FDA and  has been authorized for detection and/or diagnosis of SARS-CoV-2 by FDA  under an Emergency Use Authorization (EUA). This EUA will remain  in effect (meaning this test can be used) for the duration of the COVID-19 declaration under Section 56 4(b)(1) of the Act, 21 U.S.C. section 360bbb-3(b)(1), unless the authorization is terminated or revoked sooner. Performed at [facility], [place] [address], [place], [place] [address]   C Difficile Quick Screen w PCR reflex     Status: Abnormal   Collection Time: 02/27/20  6:48 PM  Result Value Ref Range Status   C Diff antigen POSITIVE (A) NEGATIVE Final   C Diff toxin NEGATIVE NEGATIVE Final   C Diff interpretation Results are indeterminate. See PCR results.  Final    Comment: Performed at [facility], [place] [address], [place], [place] [address]  C. Diff by PCR, Reflexed     Status: None   Collection Time: 02/27/20  6:48 PM  Result Value Ref Range Status   Toxigenic C. Difficile by PCR NEGATIVE NEGATIVE Final    Comment: Patient is colonized with non toxigenic C. difficile. May not need treatment unless significant symptoms are present. Performed at [facility], [place] [address], [place], [place] [address]   Blood culture (routine x 2)      Status: None (Preliminary result)   Collection Time: 02/27/20  6:49 PM   Specimen: BLOOD  Result Value Ref Range Status   Specimen Description BLOOD BLOOD RIGHT FOREARM  Final   Special Requests   Final    BOTTLES DRAWN AEROBIC AND ANAEROBIC Blood Culture adequate volume   Culture   Final    NO GROWTH < 12 HOURS Performed at [facility], [place] [address] [place], [place] [address]    Report Status PENDING  Incomplete  Blood culture (routine x 2)     Status: None (Preliminary result)   Collection Time: 02/27/20  7:50 PM   Specimen: BLOOD LEFT ARM  Result Value Ref Range Status   Specimen Description BLOOD LEFT ARM  Final   Special Requests   Final    BOTTLES DRAWN AEROBIC AND ANAEROBIC Blood Culture adequate volume   Culture   Final    NO GROWTH < 12 HOURS Performed at [facility], [place] [address], [place], [place] [address]    Report Status PENDING  Incomplete    Procedures and diagnostic studies:  CT Head Wo Contrast  Result Date: 02/27/2020 CLINICAL DATA:  73 year old male with altered mental status. EXAM: CT HEAD WITHOUT CONTRAST TECHNIQUE: Contiguous axial images were obtained from the base of the skull through the vertex without intravenous contrast. COMPARISON:  Head CT dated 01/17/2020. FINDINGS: Brain: There is moderate age-related atrophy and chronic microvascular ischemic changes. Areas of old infarct and encephalomalacia noted in the occipital lobes bilaterally. Small bilateral thalamic old lacunar infarcts as well as left cerebellar old lacunar infarct noted. There is no acute intracranial hemorrhage. No mass effect or midline shift. No extra-axial fluid collection. Vascular: No hyperdense vessel or unexpected calcification. Skull: Normal. Negative for fracture or focal lesion. Sinuses/Orbits: Diffuse mucoperiosteal thickening of paranasal sinuses with opacification of multiple ethmoid air cells. No air-fluid level. Mastoid air cells are clear. Other: None  IMPRESSION: 1. No acute intracranial pathology. 2. Age-related atrophy and chronic microvascular ischemic changes and areas of old infarct. Electronically Signed   By: Anner Crete M.D.   On: 02/27/2020 19:45   CT Chest Wo Contrast  Result Date: 02/27/2020 CLINICAL DATA:  74 year old male with persistent cough. History  of lung cancer. EXAM: CT CHEST WITHOUT CONTRAST TECHNIQUE: Multidetector CT imaging of the chest was performed following the standard protocol without IV contrast. COMPARISON:  Chest CT dated 12/12/2019. FINDINGS: Evaluation of this exam is limited in the absence of intravenous contrast. Cardiovascular: There is no cardiomegaly or pericardial effusion. 3 vessel coronary vascular calcification. There is mild atherosclerotic calcification of the thoracic aorta. The central pulmonary arteries are grossly unremarkable on this noncontrast CT. The pacemaker wire is noted. Mediastinum/Nodes: No hilar or mediastinal adenopathy. The esophagus and the thyroid gland are grossly unremarkable as visualized. No mediastinal fluid collection. Lungs/Pleura: There is moderate to advanced centrilobular and paraseptal emphysema. Linear and streaky density along the minor fissure in the left upper lobe most consistent with atelectasis or scarring. There is an area of ground-glass opacity in the posterior subpleural right upper lobe and superior segment of the right lower lobe appears less focal and more diffuse compared to the prior CT. A nodular density was noted in this region on the prior CT. This may represent an area of atelectasis/scarring or infiltrate although neoplasm is not excluded. Continued close follow-up recommended. There is no pleural effusion or pneumothorax. The central airways remain patent. Upper Abdomen: No acute abnormality. Musculoskeletal: T6 compression fracture with near complete loss of vertebral body height similar or slightly progressed since the prior CT. Old T10 compression fracture  is also noted. Old healed sternal fracture. No acute osseous pathology. IMPRESSION: 1. Focal area of ground-glass opacity in the posterior subpleural right upper lobe and superior segment of the right lower lobe appears larger and more diffuse compared to the prior CT. This may represent an area of atelectasis/scarring or infiltrate. Neoplasm is not excluded. Continued close follow-up recommended. 2. Moderate to advanced emphysema. 3. Coronary vascular calcification. 4. Old T6 and T10 compression deformities. 5. Aortic Atherosclerosis (ICD10-I70.0) and Emphysema (ICD10-J43.9). Electronically Signed   By: Anner Crete M.D.   On: 02/27/2020 19:56   DG Chest Portable 1 View  Result Date: 02/27/2020 CLINICAL DATA:  Cough, shortness of breath EXAM: PORTABLE CHEST 1 VIEW COMPARISON:  01/17/2020 FINDINGS: Multifocal airspace opacities in the lungs bilaterally, with relative sparing of the right lung apex. While some of this appearance is chronic, reflecting radiation changes chronic interstitial lung disease, superimposed pneumonia (particularly in the central right upper lobe) is suspected. No pleural effusion or pneumothorax. Cardiomegaly.  Right chest ICD. IMPRESSION: Multifocal pulmonary opacities, some of which may be chronic, although superimposed pneumonia is suspected. Electronically Signed   By: Julian Hy M.D.   On: 02/27/2020 18:14    Medications:   . acidophilus  1 capsule Oral Daily  . [START ON 02/29/2020] atorvastatin  80 mg Oral Q M,W,F  . finasteride  5 mg Oral Daily  . metoprolol tartrate  37.5 mg Oral BID  . mirabegron ER  50 mg Oral QPM  . sertraline  50 mg Oral Daily  . tamsulosin  0.4 mg Oral QPC supper  . umeclidinium bromide  1 puff Inhalation Daily  . Warfarin - Pharmacist Dosing Inpatient   Does not apply q1800   Continuous Infusions: . azithromycin Stopped (02/28/20 0017)  . calcium gluconate    . cefTRIAXone (ROCEPHIN)  IV       LOS: 0 days   Geradine Girt  [facility]   How to contact the [facility] Attending or Consulting provider [place] or covering provider during after hours [place], for this patient?  1. Check the care team in  [facility] and look for a) attending/consulting [facility] provider listed and b) the [facility] team listed 2. Log into [email or web address] and use [place]'s universal password to access. If you do not have the password, please contact the hospital operator. 3. Locate the [facility] provider you are looking for under [facility] and page to a number that you can be directly reached. 4. If you still have difficulty reaching the provider, please page the [facility] (Director on Call) for the Hospitalists listed on amion for assistance.  02/28/2020, 10:29 AM

## 2020-02-28 NOTE — Progress Notes (Signed)
ANTICOAGULATION CONSULT NOTE  Pharmacy Consult:  Coumadin  Indication: hx VTE  Allergies  Allergen Reactions  . Ativan [Lorazepam] Anxiety and Other (See Comments)    Hyper  [name]    Vital Signs: Temp: 98.9 F (37.2 C) (03/02 0756) BP: 123/56 (03/02 1006) Pulse Rate: 110 (03/02 1006)  Labs: Recent Labs    02/27/20 1408 02/27/20 1408 02/27/20 1806 02/27/20 1817 02/27/20 1817 02/27/20 2240 02/28/20 0002 02/28/20 0234  HGB 16.4   < >  --  18.7*   < > 15.4  --  15.3  HCT 51.5   < >  --  55.0*  --  48.5  --  47.9  PLT 294  --   --   --   --  290  --  268  LABPROT  --   --  22.6*  --   --   --   --  23.6*  INR  --   --  2.0*  --   --   --   --  2.1*  CREATININE 0.79  --   --   --   --  0.88  --  0.72  TROPONINIHS 12   < > 13  --   --   --  17 12   < > = values in this interval not displayed.    CrCl cannot be calculated (Unknown ideal weight.).   Assessment: 43 yom on chronic Coumadin presented to the ED with cough and diarrhea.  Pharmacy consulted to continue Coumadin from PTA.  INR remains therapeutic; no bleeding reported.  PTA regimen: 2mg  TTSa and 3mg  MWFSu  Goal of Therapy:  INR 2-3 Monitor platelets by anticoagulation protocol: Yes   Plan:  Coumadin 2mg  PO today Daily PT / INR  Alec Mcphee D. Mina Marble, PharmD, BCPS, [place] 02/28/2020, 10:35 AM

## 2020-02-28 NOTE — Plan of Care (Signed)

## 2020-02-29 DIAGNOSIS — J9601 Acute respiratory failure with hypoxia: Secondary | ICD-10-CM | POA: Diagnosis not present

## 2020-02-29 LAB — BASIC METABOLIC PANEL
Anion gap: 10 (ref 5–15)
BUN: 12 mg/dL (ref 8–23)
CO2: 29 mmol/L (ref 22–32)
Calcium: 9 mg/dL (ref 8.9–10.3)
Chloride: 101 mmol/L (ref 98–111)
Creatinine, Ser: 0.68 mg/dL (ref 0.61–1.24)
GFR calc Af Amer: >60 mL/min (ref >60)
GFR calc non Af Amer: >60 mL/min (ref >60)
Glucose, Bld: 90 mg/dL (ref 70–99)
Potassium: 3.8 mmol/L (ref 3.5–5.1)
Sodium: 140 mmol/L (ref 135–145)

## 2020-02-29 LAB — CBC
HCT: 47.2 % (ref 39.0–52.0)
Hemoglobin: 15 g/dL (ref 13.0–17.0)
MCH: 27.9 pg (ref 26.0–34.0)
MCHC: 31.8 g/dL (ref 30.0–36.0)
MCV: 87.7 fL (ref 80.0–100.0)
Platelets: 200 10*3/uL (ref 150–400)
RBC: 5.38 MIL/uL (ref 4.22–5.81)
RDW: 17.2 % — ABNORMAL HIGH (ref 11.5–15.5)
WBC: 12.5 10*3/uL — ABNORMAL HIGH (ref 4.0–10.5)
nRBC: 0 % (ref 0.0–0.2)

## 2020-02-29 LAB — BRAIN NATRIURETIC PEPTIDE: B Natriuretic Peptide: 76.5 pg/mL (ref 0.0–100.0)

## 2020-02-29 LAB — LEGIONELLA PNEUMOPHILA SEROGP 1 UR AG: L. pneumophila Serogp 1 Ur Ag: NEGATIVE

## 2020-02-29 LAB — PROCALCITONIN: Procalcitonin: <0.1 ng/mL

## 2020-02-29 LAB — PROTIME-INR
INR: 2.9 — ABNORMAL HIGH (ref 0.8–1.2)
Prothrombin Time: 30.2 seconds — ABNORMAL HIGH (ref 11.4–15.2)

## 2020-02-29 MED ORDER — SENNOSIDES-DOCUSATE SODIUM 8.6-50 MG PO TABS: 2.0000 | ORAL_TABLET | Freq: Every evening | ORAL | Status: DC | PRN

## 2020-02-29 MED ORDER — POLYETHYLENE GLYCOL 3350 17 G PO PACK: 17.0000 g | PACK | Freq: Every day | ORAL | Status: DC | PRN

## 2020-02-29 MED ORDER — WARFARIN SODIUM 1 MG PO TABS
1.0000 mg | ORAL_TABLET | Freq: Once | ORAL | Status: AC
  Administered 2020-02-29: 1 mg via ORAL
  Filled 2020-02-29: qty 1

## 2020-02-29 MED ORDER — METHYLPREDNISOLONE SODIUM SUCC 40 MG IJ SOLR
40.0000 mg | Freq: Three times a day (TID) | INTRAMUSCULAR | Status: DC
  Administered 2020-02-29 – 2020-03-08 (×24): 40 mg via INTRAVENOUS
  Filled 2020-02-29 (×24): qty 1

## 2020-02-29 MED ORDER — IPRATROPIUM-ALBUTEROL 0.5-2.5 (3) MG/3ML IN SOLN: 3.0000 mL | RESPIRATORY_TRACT | Status: DC | PRN

## 2020-02-29 MED ORDER — BUDESONIDE 0.5 MG/2ML IN SUSP
0.5000 mg | Freq: Two times a day (BID) | RESPIRATORY_TRACT | Status: DC
  Administered 2020-02-29 – 2020-03-09 (×19): 0.5 mg via RESPIRATORY_TRACT
  Filled 2020-02-29 (×19): qty 2

## 2020-02-29 MED ORDER — IPRATROPIUM-ALBUTEROL 0.5-2.5 (3) MG/3ML IN SOLN
3.0000 mL | Freq: Four times a day (QID) | RESPIRATORY_TRACT | Status: DC
  Administered 2020-02-29 – 2020-03-01 (×5): 3 mL via RESPIRATORY_TRACT
  Filled 2020-02-29 (×6): qty 3

## 2020-02-29 NOTE — Plan of Care (Signed)

## 2020-02-29 NOTE — Progress Notes (Signed)
PROGRESS NOTE    Travis Cruz  XFG:[id number] DOB: Jan 02, 1946 DOA: 02/27/2020 PCP: Dettinger, Fransisca Kaufmann, MD   Brief Narrative:  74 year old with history of CAD, pulmonary embolism on Coumadin, COPD, vascular dementia, CHF presented to the hospital with productive cough for the last several days and diarrhea.  Upon admission chest x-ray showed multifocal infiltrate on the right side which was confirmed on the CT of the chest.  Empirically started on antibiotics.   Assessment & Plan:   Principal Problem:   Acute respiratory failure with hypoxia (HCC) Active Problems:   COPD (chronic obstructive pulmonary disease) (HCC)   History of pulmonary embolus (PE)   History of CVA (cerebrovascular accident)   Chronic diastolic CHF (congestive heart failure) ([place])   Hypertension   Primary cancer of right upper lobe of lung ([place])   Diarrhea   CAP (community acquired pneumonia)   Pneumonia  Acute hypoxic respiratory failure secondary to COPD Acute on chronic COPD exacerbation, moderate -Needs aggressive bronchodilator therapy, IV Solu-Medrol, incentive spirometer, flutter valve -CT chest is negative for PE but shows advanced emphysema -Supplemental oxygen and supportive care. -Procalcitonin negative.  BNP-76. -Urine strep-negative. Repeat CXR tomorrow.   Chronic hypoxia on 2 L nasal cannula -Adjust as needed here  Diarrhea -Nonspecific.  C. difficile negative, likely colonized.  Positive antigen, negative toxin.  Chronic diastolic congestive heart failure, EF greater than 65% -BNP normal.  No obvious signs of volume overload.  Continue current management as stated above.  History of pulmonary embolism -On Coumadin  Coronary artery disease -Without chest pain  DVT prophylaxis: Coumadin Code Status: Full code Family Communication:  Called His wife [name].  Disposition Plan:   Patient From= home  Patient Anticipated D/C place= PT recommending SNF  Barriers= unsafe for discharge,  quite dyspneic at rest requiring aggressive bronchodilator therapy and IV steroids.   Subjective: Tells me he is shortness of breath even at rest. Lots of coughing but unable bring up mucus.   Review of Systems Otherwise negative except as per HPI, including: General: Denies fever, chills, night sweats or unintended weight loss. Resp: Denies Hemoptysis.  Cardiac: Denies chest pain, palpitations, orthopnea, paroxysmal nocturnal dyspnea. GI: Denies abdominal pain, nausea, vomiting, diarrhea or constipation GU: Denies dysuria, frequency, hesitancy or incontinence MS: Denies muscle aches, joint pain or swelling Neuro: Denies headache, neurologic deficits (focal weakness, numbness, tingling), abnormal gait Psych: Denies anxiety, depression, SI/HI/AVH Skin: Denies new rashes or lesions ID: Denies sick contacts, exotic exposures, travel  Examination:  General exam: Appears calm and comfortable; 3L [place] Respiratory system: b/l diffuse wheezing.  Cardiovascular system: S1 & S2 heard, RRR. No JVD, murmurs, rubs, gallops or clicks. No pedal edema. Gastrointestinal system: Abdomen is nondistended, soft and nontender. No organomegaly or masses felt. Normal bowel sounds heard. Central nervous system: Alert and oriented. No focal neurological deficits. Extremities: Symmetric 5 x 5 power. Skin: No rashes, lesions or ulcers Psychiatry: Judgement and insight appear normal. Mood & affect appropriate.   Objective: Vitals:   02/28/20 1441 02/28/20 1500 02/28/20 2110 02/29/20 0752  BP: (!) 105/57  123/84 117/77  Pulse: 88  (!) 103 75  Resp: (!) 21 19    Temp: 98.2 F (36.8 C)  98.2 F (36.8 C) 97.9 F (36.6 C)  TempSrc: Oral  Oral   SpO2: 93%  93% 92%    Intake/Output Summary (Last 24 hours) at 02/29/2020 1233 Last data filed at 02/29/2020 9678 Gross per 24 hour  Intake 450 ml  Output 700 ml  Net -  250 ml   There were no vitals filed for this visit.   Data Reviewed:   CBC: Recent Labs   Lab 02/27/20 1408 02/27/20 1817 02/27/20 2240 02/28/20 0234 02/29/20 0838  WBC 13.8*  --  15.6* 15.0* 12.5*  HGB 16.4 18.7* 15.4 15.3 15.0  HCT 51.5 55.0* 48.5 47.9 47.2  MCV 87.9  --  88.3 88.2 87.7  PLT 294  --  290 268 614   Basic Metabolic Panel: Recent Labs  Lab 02/27/20 1408 02/27/20 1817 02/27/20 2240 02/28/20 0234 02/29/20 0838  NA 141 139  --  142 140  K 3.6 4.8  --  3.6 3.8  CL 103  --   --  107 101  CO2 29  --   --  25 29  GLUCOSE 117*  --   --  107* 90  BUN 13  --   --  14 12  CREATININE 0.79  --  0.88 0.72 0.68  CALCIUM 9.6  --   --  8.9 9.0  MG  --   --   --  1.7  --    GFR: CrCl cannot be calculated (Unknown ideal weight.). Liver Function Tests: No results for input(s): AST, ALT, ALKPHOS, BILITOT, PROT, ALBUMIN in the last 168 hours. No results for input(s): LIPASE, AMYLASE in the last 168 hours. No results for input(s): AMMONIA in the last 168 hours. Coagulation Profile: Recent Labs  Lab 02/27/20 1806 02/28/20 0234 02/29/20 0838  INR 2.0* 2.1* 2.9*   Cardiac Enzymes: No results for input(s): CKTOTAL, CKMB, CKMBINDEX, TROPONINI in the last 168 hours. BNP (last 3 results) No results for input(s): PROBNP in the last 8760 hours. HbA1C: No results for input(s): HGBA1C in the last 72 hours. CBG: No results for input(s): GLUCAP in the last 168 hours. Lipid Profile: No results for input(s): CHOL, HDL, LDLCALC, TRIG, CHOLHDL, LDLDIRECT in the last 72 hours. Thyroid Function Tests: No results for input(s): TSH, T4TOTAL, FREET4, T3FREE, THYROIDAB in the last 72 hours. Anemia Panel: No results for input(s): VITAMINB12, FOLATE, FERRITIN, TIBC, IRON, RETICCTPCT in the last 72 hours. Sepsis Labs: Recent Labs  Lab 02/29/20 0838  PROCALCITON <0.10    Recent Results (from the past 240 hour(s))  SARS CORONAVIRUS 2 (TAT 6-24 HRS) Nasopharyngeal Nasopharyngeal Swab     Status: None   Collection Time: 02/27/20  6:48 PM   Specimen: Nasopharyngeal Swab   Result Value Ref Range Status   SARS Coronavirus 2 NEGATIVE NEGATIVE Final    Comment: (NOTE) SARS-CoV-2 target nucleic acids are NOT DETECTED. The SARS-CoV-2 RNA is generally detectable in upper and lower respiratory specimens during the acute phase of infection. Negative results do not preclude SARS-CoV-2 infection, do not rule out co-infections with other pathogens, and should not be used as the sole basis for treatment or other patient management decisions. Negative results must be combined with clinical observations, patient history, and epidemiological information. The expected result is Negative. Fact Sheet for Patients: SugarRoll.be Fact Sheet for Healthcare Providers: https://www.woods-mathews.com/ This test is not yet approved or cleared by the [place] FDA and  has been authorized for detection and/or diagnosis of SARS-CoV-2 by FDA under an Emergency Use Authorization (EUA). This EUA will remain  in effect (meaning this test can be used) for the duration of the COVID-19 declaration under Section 56 4(b)(1) of the Act, 21 U.S.C. section 360bbb-3(b)(1), unless the authorization is terminated or revoked sooner. Performed at [facility], [place] [address], [place], [place] [address]  C Difficile Quick Screen w PCR reflex     Status: Abnormal   Collection Time: 02/27/20  6:48 PM  Result Value Ref Range Status   C Diff antigen POSITIVE (A) NEGATIVE Final   C Diff toxin NEGATIVE NEGATIVE Final   C Diff interpretation Results are indeterminate. See PCR results.  Final    Comment: Performed at [facility], [place] [address] [place], [place] [address]  C. Diff by PCR, Reflexed     Status: None   Collection Time: 02/27/20  6:48 PM  Result Value Ref Range Status   Toxigenic C. Difficile by PCR NEGATIVE NEGATIVE Final    Comment: Patient is colonized with non toxigenic C. difficile. May not need treatment unless significant  symptoms are present. Performed at [facility], [place] [address] [place], [place] [address]   Blood culture (routine x 2)     Status: None (Preliminary result)   Collection Time: 02/27/20  6:49 PM   Specimen: BLOOD  Result Value Ref Range Status   Specimen Description BLOOD BLOOD RIGHT FOREARM  Final   Special Requests   Final    BOTTLES DRAWN AEROBIC AND ANAEROBIC Blood Culture adequate volume   Culture   Final    NO GROWTH 2 DAYS Performed at [facility], [place] [address] [place], [place] [address]    Report Status PENDING  Incomplete  Blood culture (routine x 2)     Status: None (Preliminary result)   Collection Time: 02/27/20  7:50 PM   Specimen: BLOOD LEFT ARM  Result Value Ref Range Status   Specimen Description BLOOD LEFT ARM  Final   Special Requests   Final    BOTTLES DRAWN AEROBIC AND ANAEROBIC Blood Culture adequate volume   Culture   Final    NO GROWTH 2 DAYS Performed at [facility], [place] [address] [place],  [address]    Report Status PENDING  Incomplete         Radiology Studies: CT Head Wo Contrast  Result Date: 02/27/2020 CLINICAL DATA:  74 year old male with altered mental status. EXAM: CT HEAD WITHOUT CONTRAST TECHNIQUE: Contiguous axial images were obtained from the base of the skull through the vertex without intravenous contrast. COMPARISON:  Head CT dated 01/17/2020. FINDINGS: Brain: There is moderate age-related atrophy and chronic microvascular ischemic changes. Areas of old infarct and encephalomalacia noted in the occipital lobes bilaterally. Small bilateral thalamic old lacunar infarcts as well as left cerebellar old lacunar infarct noted. There is no acute intracranial hemorrhage. No mass effect or midline shift. No extra-axial fluid collection. Vascular: No hyperdense vessel or unexpected calcification. Skull: Normal. Negative for fracture or focal lesion. Sinuses/Orbits: Diffuse mucoperiosteal thickening of paranasal sinuses  with opacification of multiple ethmoid air cells. No air-fluid level. Mastoid air cells are clear. Other: None IMPRESSION: 1. No acute intracranial pathology. 2. Age-related atrophy and chronic microvascular ischemic changes and areas of old infarct. Electronically Signed   By: Anner Crete M.D.   On: 02/27/2020 19:45   CT Chest Wo Contrast  Result Date: 02/27/2020 CLINICAL DATA:  74 year old male with persistent cough. History of lung cancer. EXAM: CT CHEST WITHOUT CONTRAST TECHNIQUE: Multidetector CT imaging of the chest was performed following the standard protocol without IV contrast. COMPARISON:  Chest CT dated 12/12/2019. FINDINGS: Evaluation of this exam is limited in the absence of intravenous contrast. Cardiovascular: There is no cardiomegaly or pericardial effusion. 3 vessel coronary vascular calcification. There is mild atherosclerotic  calcification of the thoracic aorta. The central pulmonary arteries are grossly unremarkable on this noncontrast CT. The pacemaker wire is noted. Mediastinum/Nodes: No hilar or mediastinal adenopathy. The esophagus and the thyroid gland are grossly unremarkable as visualized. No mediastinal fluid collection. Lungs/Pleura: There is moderate to advanced centrilobular and paraseptal emphysema. Linear and streaky density along the minor fissure in the left upper lobe most consistent with atelectasis or scarring. There is an area of ground-glass opacity in the posterior subpleural right upper lobe and superior segment of the right lower lobe appears less focal and more diffuse compared to the prior CT. A nodular density was noted in this region on the prior CT. This may represent an area of atelectasis/scarring or infiltrate although neoplasm is not excluded. Continued close follow-up recommended. There is no pleural effusion or pneumothorax. The central airways remain patent. Upper Abdomen: No acute abnormality. Musculoskeletal: T6 compression fracture with near complete  loss of vertebral body height similar or slightly progressed since the prior CT. Old T10 compression fracture is also noted. Old healed sternal fracture. No acute osseous pathology. IMPRESSION: 1. Focal area of ground-glass opacity in the posterior subpleural right upper lobe and superior segment of the right lower lobe appears larger and more diffuse compared to the prior CT. This may represent an area of atelectasis/scarring or infiltrate. Neoplasm is not excluded. Continued close follow-up recommended. 2. Moderate to advanced emphysema. 3. Coronary vascular calcification. 4. Old T6 and T10 compression deformities. 5. Aortic Atherosclerosis (ICD10-I70.0) and Emphysema (ICD10-J43.9). Electronically Signed   By: Anner Crete M.D.   On: 02/27/2020 19:56   DG Chest Portable 1 View  Result Date: 02/27/2020 CLINICAL DATA:  Cough, shortness of breath EXAM: PORTABLE CHEST 1 VIEW COMPARISON:  01/17/2020 FINDINGS: Multifocal airspace opacities in the lungs bilaterally, with relative sparing of the right lung apex. While some of this appearance is chronic, reflecting radiation changes chronic interstitial lung disease, superimposed pneumonia (particularly in the central right upper lobe) is suspected. No pleural effusion or pneumothorax. Cardiomegaly.  Right chest ICD. IMPRESSION: Multifocal pulmonary opacities, some of which may be chronic, although superimposed pneumonia is suspected. Electronically Signed   By: Julian Hy M.D.   On: 02/27/2020 18:14     Scheduled Meds: . acidophilus  1 capsule Oral Daily  . atorvastatin  80 mg Oral Q M,W,F  . budesonide (PULMICORT) nebulizer solution  0.5 mg Nebulization BID  . finasteride  5 mg Oral Daily  . ipratropium-albuterol  3 mL Nebulization Q6H  . methylPREDNISolone (SOLU-MEDROL) injection  40 mg Intravenous Q8H  . metoprolol tartrate  37.5 mg Oral BID  . mirabegron ER  50 mg Oral QPM  . sertraline  50 mg Oral Daily  . tamsulosin  0.4 mg Oral QPC  supper  . umeclidinium bromide  1 puff Inhalation Daily  . warfarin  1 mg Oral ONCE-1800  . Warfarin - Pharmacist Dosing Inpatient   Does not apply q1800   Continuous Infusions: . azithromycin 500 mg (02/28/20 2118)  . cefTRIAXone (ROCEPHIN)  IV 2 g (02/29/20 0822)     LOS: 1 day   Time spent= 35 mins    Laurette Villescas Arsenio Loader, MD [facility]  If 7PM-7AM, please contact night-coverage  02/29/2020, 12:33 PM

## 2020-02-29 NOTE — NC FL2 (Signed)
[place] LEVEL OF CARE SCREENING TOOL     IDENTIFICATION  Patient Name: Travis Cruz Birthdate: 05-19-1946 Sex: male Admission Date (Current Location): 02/27/2020  [facility] and [place] Number:  Herbalist and Address:  [facility], [place] [address] [place], [place] [address]      Provider Number: [id number]  Attending Physician Name and Address:  Damita Lack, MD  Relative Name and Phone Number:  [name], spouse, [phone]    Current Level of Care: Hospital Recommended Level of Care: [place] Prior Approval Number:    Date Approved/Denied:   PASRR Number: [id number]  Discharge Plan: SNF    Current Diagnoses: Patient Active Problem List   Diagnosis Date Noted  . Pneumonia 02/28/2020  . Acute respiratory failure with hypoxia ([place]) 02/27/2020  . Diarrhea 02/27/2020  . CAP (community acquired pneumonia) 02/27/2020  . Vascular dementia without behavioral disturbance ([place])   . Acute on chronic systolic CHF (congestive heart failure) ([place]) 01/17/2020  . Chronic respiratory failure with hypoxia ([place]) 01/17/2020  . Abscess of left leg 01/17/2020  . Primary cancer of right upper lobe of lung ([place]) 10/04/2019  . Memory deficit following unspecified cerebrovascular disease 09/08/2019  . Dementia with behavioral disturbance ([place]) 09/08/2019  . Nodule of upper lobe of right lung 09/07/2019  . Fall 03/02/2019  . Senile purpura ([place]) 11/02/2017  . Preoperative cardiovascular examination   . Chronic diastolic CHF (congestive heart failure) ([place])   . Closed left hip fracture, initial encounter ([place])   . Supratherapeutic INR   . Hip fracture requiring operative repair ([place]) 09/07/2017  . Hip fracture ([place]) 09/07/2017  . Abrasion, right knee, initial encounter   . Forehead contusion   . Cancer of bronchus of right lower lobe ([place]) 02/25/2017  . History of DVT (deep vein thrombosis) 12/04/2016  . History of  pulmonary embolus (PE) 12/04/2016  . History of CVA (cerebrovascular accident) 12/04/2016  . Macular degeneration, dry 12/20/2015  . Abscess 09/27/2015  . COPD (chronic obstructive pulmonary disease) ([place])   . Implantable cardioverter-defibrillator single chamber [place] [facility] 10/05/2013  . Malignant neoplasm of upper lobe of lung ([place]) 09/16/2013  . Abnormal chest x-ray 05/13/2013  . Hyperlipidemia 05/13/2013  . CAD (coronary artery disease) 05/13/2013  . Transient ischemic attack (TIA), and cerebral infarction without residual deficits 03/28/2013  . History of ventricular fibrillation 03/28/2013  . Generalized ischemic myocardial dysfunction 03/15/2013  . NSTEMI (non-ST elevated myocardial infarction) ([place]) 01/27/2013  . Acute non-ST-elevation myocardial infarction ([place]) 01/27/2013  . Obstructive sleep apnea 03/10/2012  . Patent arterial duct 03/10/2012  . Obstructive sleep apnea syndrome in adult 03/10/2012  . HTN (hypertension) 03/24/2011  . Chronic obstructive pulmonary disease ([place]) 03/24/2011  . Hypertension 03/24/2011  . Stroke ([place]) 12/29/2009  . Deep vein thrombosis ([place]) 09/23/2008    Orientation RESPIRATION BLADDER Height & Weight     Self  O2(Nasal cannula 2L) Incontinent, External catheter Weight:   Height:     BEHAVIORAL SYMPTOMS/MOOD NEUROLOGICAL BOWEL NUTRITION STATUS      Continent Diet  AMBULATORY STATUS COMMUNICATION OF NEEDS Skin   Extensive Assist Verbally Normal                       Personal Care Assistance Level of Assistance  Bathing, Feeding, Dressing Bathing Assistance: Maximum assistance Feeding assistance: Independent Dressing Assistance: Limited assistance     Functional Limitations Info  Sight, Hearing, Speech Sight Info: Adequate Hearing Info: Adequate  Speech Info: Adequate    SPECIAL CARE FACTORS FREQUENCY  PT (By licensed PT), OT (By licensed OT)     PT Frequency: 5x OT Frequency: 4x            Contractures Contractures  Info: Not present    Additional Factors Info  Code Status, Allergies, Psychotropic Code Status Info: Full Allergies Info: Ativan Psychotropic Info: Zoloft         Current Medications (02/29/2020):  This is the current hospital active medication list Current Facility-Administered Medications  Medication Dose Route Frequency Provider Last Rate Last Admin  . acetaminophen (TYLENOL) tablet 650 mg  650 mg Oral Q6H PRN Rise Patience, MD       Or  . acetaminophen (TYLENOL) suppository 650 mg  650 mg Rectal Q6H PRN Rise Patience, MD      . acidophilus (RISAQUAD) capsule 1 capsule  1 capsule Oral Daily Eulogio Bear U, DO   1 capsule at 02/29/20 0815  . albuterol (PROVENTIL) (2.5 MG/3ML) 0.083% nebulizer solution 2.5 mg  2.5 mg Nebulization Q4H PRN Vann, Jessica U, DO      . atorvastatin (LIPITOR) tablet 80 mg  80 mg Oral Q M,W,F Rise Patience, MD   80 mg at 02/29/20 0815  . azithromycin (ZITHROMAX) 500 mg in sodium chloride 0.9 % 250 mL IVPB  500 mg Intravenous Q24H Rise Patience, MD 250 mL/hr at 02/28/20 2118 500 mg at 02/28/20 2118  . budesonide (PULMICORT) nebulizer solution 0.5 mg  0.5 mg Nebulization BID Amin, Ankit Chirag, MD   0.5 mg at 02/29/20 1447  . cefTRIAXone (ROCEPHIN) 2 g in sodium chloride 0.9 % 100 mL IVPB  2 g Intravenous Q24H Rise Patience, MD 200 mL/hr at 02/29/20 0822 2 g at 02/29/20 3532  . finasteride (PROSCAR) tablet 5 mg  5 mg Oral Daily Rise Patience, MD   5 mg at 02/29/20 0816  . ipratropium-albuterol (DUONEB) 0.5-2.5 (3) MG/3ML nebulizer solution 3 mL  3 mL Nebulization Q4H PRN Amin, Ankit Chirag, MD      . ipratropium-albuterol (DUONEB) 0.5-2.5 (3) MG/3ML nebulizer solution 3 mL  3 mL Nebulization Q6H Amin, Ankit Chirag, MD   3 mL at 02/29/20 1447  . methylPREDNISolone sodium succinate (SOLU-MEDROL) 40 mg/mL injection 40 mg  40 mg Intravenous Q8H Amin, Ankit Chirag, MD   40 mg at 02/29/20 1457  . metoprolol tartrate (LOPRESSOR)  tablet 37.5 mg  37.5 mg Oral BID Rise Patience, MD   37.5 mg at 02/29/20 0815  . mirabegron ER (MYRBETRIQ) tablet 50 mg  50 mg Oral QPM Rise Patience, MD   50 mg at 02/28/20 1641  . ondansetron (ZOFRAN) tablet 4 mg  4 mg Oral Q6H PRN Rise Patience, MD       Or  . ondansetron [facility]) injection 4 mg  4 mg Intravenous Q6H PRN Rise Patience, MD      . polyethylene glycol (MIRALAX / GLYCOLAX) packet 17 g  17 g Oral Daily PRN Amin, Ankit Chirag, MD      . senna-docusate (Senokot-S) tablet 2 tablet  2 tablet Oral QHS PRN Amin, Ankit Chirag, MD      . sertraline (ZOLOFT) tablet 50 mg  50 mg Oral Daily Rise Patience, MD   50 mg at 02/29/20 0816  . tamsulosin (FLOMAX) capsule 0.4 mg  0.4 mg Oral QPC supper Rise Patience, MD   0.4 mg at 02/28/20 1641  . umeclidinium bromide (INCRUSE  ELLIPTA) 62.5 MCG/INH 1 puff  1 puff Inhalation Daily Rise Patience, MD   1 puff at 02/29/20 1452  . warfarin (COUMADIN) tablet 1 mg  1 mg Oral ONCE-1800 Bertis Ruddy, RPH      . Warfarin - Pharmacist Dosing Inpatient   Does not apply [id number] Rise Patience, MD         Discharge Medications: Please see discharge summary for a list of discharge medications.  Relevant Imaging Results:  Relevant Lab Results:   Additional Information SSN: [place] negative 3/1  Benard Halsted, LCSW

## 2020-02-29 NOTE — Progress Notes (Signed)
ANTICOAGULATION CONSULT NOTE  Pharmacy Consult:  Coumadin  Indication: hx VTE  Allergies  Allergen Reactions  . Ativan [Lorazepam] Anxiety and Other (See Comments)    Hyper  [name]    Vital Signs: Temp: 97.9 F (36.6 C) (03/03 0752) BP: 117/77 (03/03 0752) Pulse Rate: 75 (03/03 0752)  Labs: Recent Labs    02/27/20 1806 02/27/20 1817 02/27/20 2240 02/27/20 2240 02/28/20 0002 02/28/20 0234 02/29/20 0838  HGB  --    < > 15.4   < >  --  15.3 15.0  HCT  --    < > 48.5  --   --  47.9 47.2  PLT  --    < > 290  --   --  268 200  LABPROT 22.6*  --   --   --   --  23.6* 30.2*  INR 2.0*  --   --   --   --  2.1* 2.9*  CREATININE  --    < > 0.88  --   --  0.72 0.68  TROPONINIHS 13  --   --   --  17 12  --    < > = values in this interval not displayed.    CrCl cannot be calculated (Unknown ideal weight.).   Assessment: 33 yom on chronic Coumadin presented to the ED with cough and diarrhea.  Pharmacy consulted to continue Coumadin from PTA.    INR upper end therapeutic, also on azithromycin  PTA regimen: 2mg  TTSa and 3mg  MWFSu  Goal of Therapy:  INR 2-3 Monitor platelets by anticoagulation protocol: Yes   Plan:  Give warfarin 1mg  PO x 1 today Daily INR, s/s bleeding  Bertis Ruddy, PharmD Clinical Pharmacist Please check AMION for all [place] numbers 02/29/2020 10:40 AM

## 2020-02-29 NOTE — TOC Progression Note (Signed)
Transition of Care [facility]) - Progression Note    Patient Details  Name: Adedamola Seto MRN: [id number] Date of Birth: 1946/01/16  Transition of Care [facility]) CM/SW [place], LCSW Phone Number: 02/29/2020, 4:17 PM  Clinical Narrative:    CSW contacted [facility] to see why patient was not able to admit there during past admissions (previous notes say pt had no SNF bed offers). They reported it was due to pt having falls and BCBS. CSW sent refferal out for review.    Expected Discharge Plan: [place] Barriers to Discharge: Continued Medical Work up  Expected Discharge Plan and Services Expected Discharge Plan: [place] arrangements for the past 2 months: Single Family Home                                       Social Determinants of Health (SDOH) Interventions    Readmission Risk Interventions No flowsheet data found.

## 2020-02-29 NOTE — TOC Initial Note (Signed)
Transition of Care [facility]) - Initial/Assessment Note    Patient Details  Name: Travis Cruz MRN: [id number] Date of Birth: 1946-02-05  Transition [facility]) CM/SW Contact:    Carles Collet, RN Phone Number: 02/29/2020, 1:39 PM  Clinical Narrative:       Spoke to patient at bedside. He states that his wife works at [employer] as a Librarian, academic.  He would like to pursue SNF. PT recs for SNF. Will need SNF workup. Will start later today as time allows.             Expected Discharge Plan: Skilled Nursing Facility Barriers to Discharge: Continued Medical Work up   Patient Goals and CMS Choice Patient states their goals for this hospitalization and ongoing recovery are:: to go to rehab      Expected Discharge Plan and Services Expected Discharge Plan: [place] arrangements for the past 2 months: Single Family Home                                      Prior Living Arrangements/Services Living arrangements for the past 2 months: Single Family Home Lives with:: Spouse                   Activities of Daily Living   ADL Screening (condition at time of admission) Is the patient deaf or have difficulty hearing?: No Does the patient have difficulty seeing, even when wearing glasses/contacts?: No Does the patient have difficulty concentrating, remembering, or making decisions?: Yes Does the patient have difficulty dressing or bathing?: Yes Does the patient have difficulty walking or climbing stairs?: Yes  Permission Sought/Granted                  Emotional Assessment              Admission diagnosis:  Acute respiratory failure with hypoxia ([place]) [J96.01] Pneumonia [J18.9] Patient Active Problem List   Diagnosis Date Noted  . Pneumonia 02/28/2020  . Acute respiratory failure with hypoxia ([place]) 02/27/2020  . Diarrhea 02/27/2020  . CAP (community acquired pneumonia) 02/27/2020  . Vascular dementia without behavioral  disturbance ([place])   . Acute on chronic systolic CHF (congestive heart failure) ([place]) 01/17/2020  . Chronic respiratory failure with hypoxia ([place]) 01/17/2020  . Abscess of left leg 01/17/2020  . Primary cancer of right upper lobe of lung ([place]) 10/04/2019  . Memory deficit following unspecified cerebrovascular disease 09/08/2019  . Dementia with behavioral disturbance ([place]) 09/08/2019  . Nodule of upper lobe of right lung 09/07/2019  . Fall 03/02/2019  . Senile purpura ([place]) 11/02/2017  . Preoperative cardiovascular examination   . Chronic diastolic CHF (congestive heart failure) ([place])   . Closed left hip fracture, initial encounter ([place])   . Supratherapeutic INR   . Hip fracture requiring operative repair ([place]) 09/07/2017  . Hip fracture ([place]) 09/07/2017  . Abrasion, right knee, initial encounter   . Forehead contusion   . Cancer of bronchus of right lower lobe ([place]) 02/25/2017  . History of DVT (deep vein thrombosis) 12/04/2016  . History of pulmonary embolus (PE) 12/04/2016  . History of CVA (cerebrovascular accident) 12/04/2016  . Macular degeneration, dry 12/20/2015  . Abscess 09/27/2015  . COPD (chronic obstructive pulmonary disease) ([place])   . Implantable cardioverter-defibrillator single chamber [place] 10/05/2013  . Malignant neoplasm of upper lobe of lung (  [place]) 09/16/2013  . Abnormal chest x-ray 05/13/2013  . Hyperlipidemia 05/13/2013  . CAD (coronary artery disease) 05/13/2013  . Transient ischemic attack (TIA), and cerebral infarction without residual deficits 03/28/2013  . History of ventricular fibrillation 03/28/2013  . Generalized ischemic myocardial dysfunction 03/15/2013  . NSTEMI (non-ST elevated myocardial infarction) ([place]) 01/27/2013  . Acute non-ST-elevation myocardial infarction ([place]) 01/27/2013  . Obstructive sleep apnea 03/10/2012  . Patent arterial duct 03/10/2012  . Obstructive sleep apnea syndrome in adult 03/10/2012  . HTN (hypertension) 03/24/2011  .  Chronic obstructive pulmonary disease ([place]) 03/24/2011  . Hypertension 03/24/2011  . Stroke ([place]) 12/29/2009  . Deep vein thrombosis ([place]) 09/23/2008   PCP:  Dettinger, Fransisca Kaufmann, MD Pharmacy:   [id number] [place], [place] [address] Phone: [phone] Fax: [phone]     Social Determinants of Health (SDOH) Interventions    Readmission Risk Interventions No flowsheet data found.

## 2020-03-01 ENCOUNTER — Inpatient Hospital Stay (HOSPITAL_COMMUNITY): Payer: BC Managed Care – PPO

## 2020-03-01 DIAGNOSIS — M6281 Muscle weakness (generalized): Secondary | ICD-10-CM | POA: Diagnosis not present

## 2020-03-01 DIAGNOSIS — R197 Diarrhea, unspecified: Secondary | ICD-10-CM | POA: Diagnosis not present

## 2020-03-01 DIAGNOSIS — R410 Disorientation, unspecified: Secondary | ICD-10-CM | POA: Diagnosis not present

## 2020-03-01 DIAGNOSIS — J9601 Acute respiratory failure with hypoxia: Secondary | ICD-10-CM | POA: Diagnosis not present

## 2020-03-01 DIAGNOSIS — R918 Other nonspecific abnormal finding of lung field: Secondary | ICD-10-CM | POA: Diagnosis not present

## 2020-03-01 DIAGNOSIS — J189 Pneumonia, unspecified organism: Secondary | ICD-10-CM | POA: Diagnosis not present

## 2020-03-01 DIAGNOSIS — M2550 Pain in unspecified joint: Secondary | ICD-10-CM | POA: Diagnosis not present

## 2020-03-01 DIAGNOSIS — F339 Major depressive disorder, recurrent, unspecified: Secondary | ICD-10-CM | POA: Diagnosis not present

## 2020-03-01 DIAGNOSIS — S0990XA Unspecified injury of head, initial encounter: Secondary | ICD-10-CM | POA: Diagnosis not present

## 2020-03-01 DIAGNOSIS — I5032 Chronic diastolic (congestive) heart failure: Secondary | ICD-10-CM | POA: Diagnosis not present

## 2020-03-01 DIAGNOSIS — J9621 Acute and chronic respiratory failure with hypoxia: Secondary | ICD-10-CM | POA: Diagnosis not present

## 2020-03-01 DIAGNOSIS — I2510 Atherosclerotic heart disease of native coronary artery without angina pectoris: Secondary | ICD-10-CM | POA: Diagnosis not present

## 2020-03-01 DIAGNOSIS — R41841 Cognitive communication deficit: Secondary | ICD-10-CM | POA: Diagnosis not present

## 2020-03-01 DIAGNOSIS — Z7401 Bed confinement status: Secondary | ICD-10-CM | POA: Diagnosis not present

## 2020-03-01 DIAGNOSIS — I8291 Chronic embolism and thrombosis of unspecified vein: Secondary | ICD-10-CM | POA: Diagnosis not present

## 2020-03-01 DIAGNOSIS — R1310 Dysphagia, unspecified: Secondary | ICD-10-CM | POA: Diagnosis not present

## 2020-03-01 DIAGNOSIS — M25562 Pain in left knee: Secondary | ICD-10-CM | POA: Diagnosis not present

## 2020-03-01 DIAGNOSIS — R1313 Dysphagia, pharyngeal phase: Secondary | ICD-10-CM | POA: Diagnosis not present

## 2020-03-01 DIAGNOSIS — J439 Emphysema, unspecified: Secondary | ICD-10-CM | POA: Diagnosis not present

## 2020-03-01 DIAGNOSIS — F0150 Vascular dementia without behavioral disturbance: Secondary | ICD-10-CM | POA: Diagnosis not present

## 2020-03-01 DIAGNOSIS — J441 Chronic obstructive pulmonary disease with (acute) exacerbation: Secondary | ICD-10-CM | POA: Diagnosis not present

## 2020-03-01 DIAGNOSIS — R262 Difficulty in walking, not elsewhere classified: Secondary | ICD-10-CM | POA: Diagnosis not present

## 2020-03-01 LAB — CBC
HCT: 47.8 % (ref 39.0–52.0)
Hemoglobin: 15.6 g/dL (ref 13.0–17.0)
MCH: 28.5 pg (ref 26.0–34.0)
MCHC: 32.6 g/dL (ref 30.0–36.0)
MCV: 87.4 fL (ref 80.0–100.0)
Platelets: 288 10*3/uL (ref 150–400)
RBC: 5.47 MIL/uL (ref 4.22–5.81)
RDW: 17.4 % — ABNORMAL HIGH (ref 11.5–15.5)
WBC: 9.5 10*3/uL (ref 4.0–10.5)
nRBC: 0 % (ref 0.0–0.2)

## 2020-03-01 LAB — BASIC METABOLIC PANEL
Anion gap: 10 (ref 5–15)
BUN: 15 mg/dL (ref 8–23)
CO2: 26 mmol/L (ref 22–32)
Calcium: 9.3 mg/dL (ref 8.9–10.3)
Chloride: 104 mmol/L (ref 98–111)
Creatinine, Ser: 0.77 mg/dL (ref 0.61–1.24)
GFR calc Af Amer: >60 mL/min (ref >60)
GFR calc non Af Amer: >60 mL/min (ref >60)
Glucose, Bld: 135 mg/dL — ABNORMAL HIGH (ref 70–99)
Potassium: 4.8 mmol/L (ref 3.5–5.1)
Sodium: 140 mmol/L (ref 135–145)

## 2020-03-01 LAB — MAGNESIUM: Magnesium: 1.9 mg/dL (ref 1.7–2.4)

## 2020-03-01 LAB — PROTIME-INR
INR: 3.2 — ABNORMAL HIGH (ref 0.8–1.2)
Prothrombin Time: 32.8 seconds — ABNORMAL HIGH (ref 11.4–15.2)

## 2020-03-01 MED ORDER — WARFARIN SODIUM 1 MG PO TABS
1.0000 mg | ORAL_TABLET | Freq: Once | ORAL | Status: AC
  Administered 2020-03-01: 1 mg via ORAL
  Filled 2020-03-01: qty 1

## 2020-03-01 MED ORDER — TRAZODONE HCL 50 MG PO TABS
50.0000 mg | ORAL_TABLET | Freq: Once | ORAL | Status: AC
  Administered 2020-03-01: 50 mg via ORAL
  Filled 2020-03-01: qty 1

## 2020-03-01 MED ORDER — IPRATROPIUM-ALBUTEROL 0.5-2.5 (3) MG/3ML IN SOLN
3.0000 mL | Freq: Four times a day (QID) | RESPIRATORY_TRACT | Status: DC
  Administered 2020-03-02 – 2020-03-05 (×14): 3 mL via RESPIRATORY_TRACT
  Filled 2020-03-01 (×13): qty 3

## 2020-03-01 NOTE — Progress Notes (Signed)
PROGRESS NOTE    Travis Cruz  UKG:[id number] DOB: 1946/02/27 DOA: 02/27/2020 PCP: Dettinger, Fransisca Kaufmann, MD   Brief Narrative:  74 year old with history of CAD, pulmonary embolism on Coumadin, COPD, vascular dementia, CHF presented to the hospital with productive cough for the last several days and diarrhea.  Upon admission chest x-ray showed multifocal infiltrate on the right side which was confirmed on the CT of the chest.  Empirically started on antibiotics.   Assessment & Plan:   Principal Problem:   Acute respiratory failure with hypoxia (HCC) Active Problems:   COPD (chronic obstructive pulmonary disease) (HCC)   History of pulmonary embolus (PE)   History of CVA (cerebrovascular accident)   Chronic diastolic CHF (congestive heart failure) ([place])   Hypertension   Primary cancer of right upper lobe of lung ([place])   Diarrhea   CAP (community acquired pneumonia)   Pneumonia  Acute hypoxic respiratory failure secondary to COPD Acute on chronic COPD exacerbation, moderate -Continues to be dyspneic with minimal exertion.  Continue aggressive bronchodilator therapy, IV Solu-Medrol, incentive spirometer, flutter valve -CT chest is negative for PE but shows advanced emphysema -Supplemental oxygen and supportive care. -Procalcitonin negative.  BNP-76. -Urine strep-negative.  Chest x-ray shows stable patchy bilateral multifocal opacities  Unwitnessed fall overnight -CT head negative.  No focal neurologic findings.  We will continue to monitor him.  Chronic hypoxia on 2 L nasal cannula -Adjust as needed here  Diarrhea -Nonspecific.  C. difficile negative, likely colonized.  Positive antigen, negative toxin.  Chronic diastolic congestive heart failure, EF greater than 65% -BNP normal.  No obvious signs of volume overload.  Continue current management as stated above.  History of pulmonary embolism -On Coumadin  Coronary artery disease -Without chest pain  DVT prophylaxis:  Coumadin Code Status: Full code Family Communication:   Disposition Plan:   Patient From= home  Patient Anticipated D/C place= PT recommending SNF  Barriers= unsafe for discharge, still gets quite dyspneic at rest requiring aggressive bronchodilator therapy and IV steroids.   Subjective: Minimal improvement of shortness of breath since yesterday.  Slightly less coughing.  Does not feel anywhere close to baseline as he continues to have significant amount of shortness of breath and productive coughing  Review of Systems Otherwise negative except as per HPI, including: General = no fevers, chills, dizziness,  fatigue HEENT/EYES = negative for loss of vision, double vision, blurred vision,  sore throa Cardiovascular= negative for chest pain, palpitation Respiratory/lungs= negative  hemoptysis,  Gastrointestinal= negative for nausea, vomiting, abdominal pain Genitourinary= negative for Dysuria MSK = Negative for arthralgia, myalgias Neurology= Negative for headache, numbness, tingling  Psychiatry= Negative for suicidal and homocidal ideation Skin= Negative for Rash   Examination: Constitutional: Not in acute distress dyspneic with minimal movement Respiratory: Diffuse bilateral rhonchi with expiratory wheezing Cardiovascular: Normal sinus rhythm, no rubs Abdomen: Nontender nondistended good bowel sounds Musculoskeletal: No edema noted Skin: No rashes seen Neurologic: CN 2-12 grossly intact.  And nonfocal Psychiatric: Normal judgment and insight. Alert and oriented x 3. Normal mood.    Objective: Vitals:   03/01/20 0558 03/01/20 0743 03/01/20 0745 03/01/20 0748  BP: 122/70     Pulse:      Resp:      Temp:      TempSrc:      SpO2: 90% 93% 95% 95%    Intake/Output Summary (Last 24 hours) at 03/01/2020 1141 Last data filed at 03/01/2020 0702 Gross per 24 hour  Intake 480 ml  Output 500 ml  Net -20 ml   There were no vitals filed for this visit.   Data Reviewed:    CBC: Recent Labs  Lab 02/27/20 1408 02/27/20 1408 02/27/20 1817 02/27/20 2240 02/28/20 0234 02/29/20 0838 03/01/20 0204  WBC 13.8*  --   --  15.6* 15.0* 12.5* 9.5  HGB 16.4   < > 18.7* 15.4 15.3 15.0 15.6  HCT 51.5   < > 55.0* 48.5 47.9 47.2 47.8  MCV 87.9  --   --  88.3 88.2 87.7 87.4  PLT 294  --   --  290 268 200 288   < > = values in this interval not displayed.   Basic Metabolic Panel: Recent Labs  Lab 02/27/20 1408 02/27/20 1817 02/27/20 2240 02/28/20 0234 02/29/20 0838 03/01/20 0204  NA 141 139  --  142 140 140  K 3.6 4.8  --  3.6 3.8 4.8  CL 103  --   --  107 101 104  CO2 29  --   --  25 29 26   GLUCOSE 117*  --   --  107* 90 135*  BUN 13  --   --  14 12 15   CREATININE 0.79  --  0.88 0.72 0.68 0.77  CALCIUM 9.6  --   --  8.9 9.0 9.3  MG  --   --   --  1.7  --  1.9   GFR: CrCl cannot be calculated (Unknown ideal weight.). Liver Function Tests: No results for input(s): AST, ALT, ALKPHOS, BILITOT, PROT, ALBUMIN in the last 168 hours. No results for input(s): LIPASE, AMYLASE in the last 168 hours. No results for input(s): AMMONIA in the last 168 hours. Coagulation Profile: Recent Labs  Lab 02/27/20 1806 02/28/20 0234 02/29/20 0838 03/01/20 0204  INR 2.0* 2.1* 2.9* 3.2*   Cardiac Enzymes: No results for input(s): CKTOTAL, CKMB, CKMBINDEX, TROPONINI in the last 168 hours. BNP (last 3 results) No results for input(s): PROBNP in the last 8760 hours. HbA1C: No results for input(s): HGBA1C in the last 72 hours. CBG: No results for input(s): GLUCAP in the last 168 hours. Lipid Profile: No results for input(s): CHOL, HDL, LDLCALC, TRIG, CHOLHDL, LDLDIRECT in the last 72 hours. Thyroid Function Tests: No results for input(s): TSH, T4TOTAL, FREET4, T3FREE, THYROIDAB in the last 72 hours. Anemia Panel: No results for input(s): VITAMINB12, FOLATE, FERRITIN, TIBC, IRON, RETICCTPCT in the last 72 hours. Sepsis Labs: Recent Labs  Lab 02/29/20 0838   PROCALCITON <0.10    Recent Results (from the past 240 hour(s))  SARS CORONAVIRUS 2 (TAT 6-24 HRS) Nasopharyngeal Nasopharyngeal Swab     Status: None   Collection Time: 02/27/20  6:48 PM   Specimen: Nasopharyngeal Swab  Result Value Ref Range Status   SARS Coronavirus 2 NEGATIVE NEGATIVE Final    Comment: (NOTE) SARS-CoV-2 target nucleic acids are NOT DETECTED. The SARS-CoV-2 RNA is generally detectable in upper and lower respiratory specimens during the acute phase of infection. Negative results do not preclude SARS-CoV-2 infection, do not rule out co-infections with other pathogens, and should not be used as the sole basis for treatment or other patient management decisions. Negative results must be combined with clinical observations, patient history, and epidemiological information. The expected result is Negative. Fact Sheet for Patients: SugarRoll.be Fact Sheet for Healthcare Providers: https://www.woods-mathews.com/ This test is not yet approved or cleared by the [place] FDA and  has been authorized for detection and/or diagnosis of SARS-CoV-2 by FDA under an Emergency Use Authorization (EUA).  This EUA will remain  in effect (meaning this test can be used) for the duration of the COVID-19 declaration under Section 56 4(b)(1) of the Act, 21 U.S.C. section 360bbb-3(b)(1), unless the authorization is terminated or revoked sooner. Performed at [facility], [place] [address] [place], [place] [address]   C Difficile Quick Screen w PCR reflex     Status: Abnormal   Collection Time: 02/27/20  6:48 PM  Result Value Ref Range Status   C Diff antigen POSITIVE (A) NEGATIVE Final   C Diff toxin NEGATIVE NEGATIVE Final   C Diff interpretation Results are indeterminate. See PCR results.  Final    Comment: Performed at [facility], [place] [address] [place], [place] [address]  C. Diff by PCR, Reflexed     Status: None    Collection Time: 02/27/20  6:48 PM  Result Value Ref Range Status   Toxigenic C. Difficile by PCR NEGATIVE NEGATIVE Final    Comment: Patient is colonized with non toxigenic C. difficile. May not need treatment unless significant symptoms are present. Performed at [facility], [place] [address] [place], [place] [address]   Blood culture (routine x 2)     Status: None (Preliminary result)   Collection Time: 02/27/20  6:49 PM   Specimen: BLOOD  Result Value Ref Range Status   Specimen Description BLOOD BLOOD RIGHT FOREARM  Final   Special Requests   Final    BOTTLES DRAWN AEROBIC AND ANAEROBIC Blood Culture adequate volume   Culture   Final    NO GROWTH 3 DAYS Performed at [facility], [place] [address], [place], [place] [address]    Report Status PENDING  Incomplete  Blood culture (routine x 2)     Status: None (Preliminary result)   Collection Time: 02/27/20  7:50 PM   Specimen: BLOOD LEFT ARM  Result Value Ref Range Status   Specimen Description BLOOD LEFT ARM  Final   Special Requests   Final    BOTTLES DRAWN AEROBIC AND ANAEROBIC Blood Culture adequate volume   Culture   Final    NO GROWTH 3 DAYS Performed at [facility], [place] [address], [place], [place] [address]    Report Status PENDING  Incomplete         Radiology Studies: CT HEAD WO CONTRAST  Result Date: 03/01/2020 CLINICAL DATA:  Unwitnessed fall EXAM: CT HEAD WITHOUT CONTRAST TECHNIQUE: Contiguous axial images were obtained from the base of the skull through the vertex without intravenous contrast. COMPARISON:  02/27/2020 FINDINGS: Brain: No evidence of acute infarction, hemorrhage, hydrocephalus, extra-axial collection or mass lesion/mass effect. Bilateral parietooccipital encephalomalacia, left greater than right. Additional small left cerebellar remote infarct. Scattered low-density changes within the periventricular and subcortical white matter compatible with chronic microvascular ischemic change.  Moderate diffuse cerebral volume loss. Vascular: Atherosclerotic calcifications involving the large vessels of the skull base. No unexpected hyperdense vessel. Skull: Negative for calvarial fracture. Sinuses/Orbits: Mucoperiosteal thickening within the bilateral ethmoid air cells and right maxillary sinus. Progressive opacification of the right maxillary sinus with air-fluid level. Orbital structures are intact. Other: None. IMPRESSION: 1.  No acute intracranial findings. 2.  Acute on chronic right maxillary sinusitis. 3. Chronic microvascular ischemic change and cerebral volume loss, stable from prior. Electronically Signed   By: Davina Poke D.O.   On: 03/01/2020 08:50   DG Chest Port 1 View  Result Date: 03/01/2020 CLINICAL DATA:  Dyspnea EXAM: PORTABLE CHEST 1 VIEW  COMPARISON:  02/27/2020 FINDINGS: Stable positioning of right-sided implanted cardiac device. Stable mild cardiomegaly. Patchy multifocal airspace opacities most pronounced within the inferior aspect of the right upper lobe and the bilateral lung bases. No appreciable interval change from prior. No pleural effusion or pneumothorax. IMPRESSION: Stable patchy multifocal airspace opacities without significant interval change. Electronically Signed   By: Davina Poke D.O.   On: 03/01/2020 08:00     Scheduled Meds: . acidophilus  1 capsule Oral Daily  . atorvastatin  80 mg Oral Q M,W,F  . budesonide (PULMICORT) nebulizer solution  0.5 mg Nebulization BID  . finasteride  5 mg Oral Daily  . ipratropium-albuterol  3 mL Nebulization Q6H  . methylPREDNISolone (SOLU-MEDROL) injection  40 mg Intravenous Q8H  . metoprolol tartrate  37.5 mg Oral BID  . mirabegron ER  50 mg Oral QPM  . sertraline  50 mg Oral Daily  . tamsulosin  0.4 mg Oral QPC supper  . umeclidinium bromide  1 puff Inhalation Daily  . warfarin  1 mg Oral ONCE-1800  . Warfarin - Pharmacist Dosing Inpatient   Does not apply q1800   Continuous Infusions: . azithromycin  500 mg (02/29/20 2107)  . cefTRIAXone (ROCEPHIN)  IV 2 g (03/01/20 0842)     LOS: 2 days   Time spent= 35 mins    Sanna Porcaro Arsenio Loader, MD [facility]  If 7PM-7AM, please contact night-coverage  03/01/2020, 11:41 AM

## 2020-03-01 NOTE — Progress Notes (Signed)
ANTICOAGULATION CONSULT NOTE  Pharmacy Consult:  Coumadin  Indication: hx VTE  Allergies  Allergen Reactions  . Ativan [Lorazepam] Anxiety and Other (See Comments)    Hyper  [name]    Vital Signs: Temp: 98.1 F (36.7 C) (03/03 2300) Temp Source: Axillary (03/03 2300) BP: 122/70 (03/04 0558) Pulse Rate: 107 (03/03 2300)  Labs: Recent Labs    02/27/20 1806 02/27/20 1806 02/27/20 1817 02/28/20 0002 02/28/20 0234 02/28/20 0234 02/29/20 0838 03/01/20 0204  HGB  --    < >   < >  --  15.3   < > 15.0 15.6  HCT  --    < >   < >  --  47.9  --  47.2 47.8  PLT  --    < >   < >  --  268  --  200 288  LABPROT 22.6*   < >  --   --  23.6*  --  30.2* 32.8*  INR 2.0*   < >  --   --  2.1*  --  2.9* 3.2*  CREATININE  --    < >   < >  --  0.72  --  0.68 0.77  TROPONINIHS 13  --   --  17 12  --   --   --    < > = values in this interval not displayed.    CrCl cannot be calculated (Unknown ideal weight.).   Assessment: 39 yom on chronic Coumadin presented to the ED with cough and diarrhea.  Pharmacy consulted to continue Coumadin from PTA.    INR trend to slightly supratherapeutic, noted large skin tear from overnight stopped bleeding by report.  On azithromycin currently.    PTA regimen: 2mg  TTSa and 3mg  MWFSu  Goal of Therapy:  INR 2-3 Monitor platelets by anticoagulation protocol: Yes   Plan:  Give warfarin 1mg  PO x 1 tonight Daily INR, s/s bleeding  Bertis Ruddy, PharmD Clinical Pharmacist Please check AMION for all [place] numbers 03/01/2020 8:42 AM

## 2020-03-01 NOTE — Plan of Care (Signed)
  Problem: Clinical Measurements: Goal: Respiratory complications will improve Outcome: Progressing  Pt denies any shortness of breath today and has significantly less coughing spells than yesterday. Pt still has coarse rhonchi and some audible wheezing but overall has made some improvements clinically. Problem: Coping: Goal: Level of anxiety will decrease Outcome: Not Progressing  Pt extremely agitated and restless throughout the day. Pt continuously yelling and unable to verbalize needs. This RN had to go into the room continuously to try to reorient and reassure the patient. The patient immediately would begin to yell again when I left the room. The patient would get easily frustrated with simple tasks. The patient could benefit from therapy and more social interaction. He also could use something to help with his anxiousness.

## 2020-03-01 NOTE — Progress Notes (Addendum)
Patient agitated during last treatment.  2 am breathing Treatment held.  Patient informed to call RT for a HHN when he wakes up

## 2020-03-01 NOTE — Plan of Care (Signed)

## 2020-03-01 NOTE — Progress Notes (Addendum)
[id number] Patient's bed alarm sounded, staff reacted and went into patient's room to find the patient on the floor. The patient has been agitated all night, but had multiple face to face contacts from nursing staff. Patient has a large skin tear to the fat on the left arm that stopped bleeding in sufficient time. VS were obtained and the patient was placed back into bed. Patient is not a good historian as he has a history of dementia; patient stated he needed to urinate, but was reminded frequently during the night that he has a catheter in place and it was working. This RN assessed patient and helped the staff that found the patient back into bed. M. Sharlet [place] was notified as well as the patient's wife. Awaiting new orders.  Bed alarm was on before the fall and was placed on the middle setting with floor mats in place.

## 2020-03-02 ENCOUNTER — Inpatient Hospital Stay (HOSPITAL_COMMUNITY): Payer: BC Managed Care – PPO

## 2020-03-02 LAB — CBC
HCT: 48.8 % (ref 39.0–52.0)
Hemoglobin: 15.8 g/dL (ref 13.0–17.0)
MCH: 28 pg (ref 26.0–34.0)
MCHC: 32.4 g/dL (ref 30.0–36.0)
MCV: 86.4 fL (ref 80.0–100.0)
Platelets: 316 10*3/uL (ref 150–400)
RBC: 5.65 MIL/uL (ref 4.22–5.81)
RDW: 17.3 % — ABNORMAL HIGH (ref 11.5–15.5)
WBC: 20 10*3/uL — ABNORMAL HIGH (ref 4.0–10.5)
nRBC: 0 % (ref 0.0–0.2)

## 2020-03-02 LAB — BASIC METABOLIC PANEL
Anion gap: 11 (ref 5–15)
BUN: 17 mg/dL (ref 8–23)
CO2: 26 mmol/L (ref 22–32)
Calcium: 9.6 mg/dL (ref 8.9–10.3)
Chloride: 105 mmol/L (ref 98–111)
Creatinine, Ser: 0.73 mg/dL (ref 0.61–1.24)
GFR calc Af Amer: >60 mL/min (ref >60)
GFR calc non Af Amer: >60 mL/min (ref >60)
Glucose, Bld: 140 mg/dL — ABNORMAL HIGH (ref 70–99)
Potassium: 4.7 mmol/L (ref 3.5–5.1)
Sodium: 142 mmol/L (ref 135–145)

## 2020-03-02 LAB — MAGNESIUM: Magnesium: 2.1 mg/dL (ref 1.7–2.4)

## 2020-03-02 LAB — PROTIME-INR
INR: 2.8 — ABNORMAL HIGH (ref 0.8–1.2)
Prothrombin Time: 29.8 seconds — ABNORMAL HIGH (ref 11.4–15.2)

## 2020-03-02 MED ORDER — HALOPERIDOL LACTATE 5 MG/ML IJ SOLN
5.0000 mg | Freq: Once | INTRAMUSCULAR | Status: AC | PRN
  Administered 2020-03-02: 5 mg via INTRAVENOUS
  Filled 2020-03-02: qty 1

## 2020-03-02 MED ORDER — WARFARIN SODIUM 2 MG PO TABS
2.0000 mg | ORAL_TABLET | Freq: Once | ORAL | Status: AC
  Administered 2020-03-02: 2 mg via ORAL
  Filled 2020-03-02: qty 1

## 2020-03-02 NOTE — Progress Notes (Signed)
PROGRESS NOTE    Travis Cruz  CZY:[id number] DOB: 03-Nov-1946 DOA: 02/27/2020 PCP: Dettinger, Fransisca Kaufmann, MD   Brief Narrative:  74 year old with history of CAD, pulmonary embolism on Coumadin, COPD, vascular dementia, CHF presented to the hospital with productive cough for the last several days and diarrhea.  Upon admission chest x-ray showed multifocal infiltrate on the right side which was confirmed on the CT of the chest.  Empirically started on antibiotics.  Had a fall overnight, CT of the head is negative.  Superficial skin tearing of the left upper extremity, left knee swelling.   Assessment & Plan:   Principal Problem:   Acute respiratory failure with hypoxia (HCC) Active Problems:   COPD (chronic obstructive pulmonary disease) (HCC)   History of pulmonary embolus (PE)   History of CVA (cerebrovascular accident)   Chronic diastolic CHF (congestive heart failure) ([place])   Hypertension   Primary cancer of right upper lobe of lung ([place])   Diarrhea   CAP (community acquired pneumonia)   Pneumonia  Acute hypoxic respiratory failure secondary to COPD Acute on chronic COPD exacerbation, moderate -Continues to be dyspneic with minimal exertion.  Continue aggressive bronchodilator therapy, IV Solu-Medrol, incentive spirometer, flutter valve -CT chest is negative for PE but shows advanced emphysema -Supplemental oxygen and supportive care. -Procalcitonin negative.  BNP-76. -Urine strep-negative.  Chest x-ray shows stable patchy bilateral multifocal opacities  Recurrent overnight fall Left knee swelling -CT head negative.  No focal neurologic finding.  Has some superficial skin tearing of the left upper extremity with prolonged bleeding secondary to Coumadin.  Advised nursing staff for pressure dressing. -Left knee x-ray -Fall precaution, bed alarm.  If necessary will place sitter especially at night.  Chronic hypoxia on 2 L nasal cannula -Adjust as needed  here  Diarrhea -Resolved, C. difficile toxin negative, positive antigen, likely colonized.  Chronic diastolic congestive heart failure, EF greater than 65% -BNP normal.  No obvious signs of volume overload.  Continue current management as stated above.  History of pulmonary embolism -On Coumadin  Coronary artery disease -Without chest pain  DVT prophylaxis: Coumadin Code Status: Full code Family Communication:  Wife updated.  Disposition Plan:   Patient From= home  Patient Anticipated D/C place= PT recommending SNF  Barriers= unsafe for discharge, still gets quite dyspneic at rest requiring aggressive bronchodilator therapy and IV steroids.   Subjective: Patient attempted to get up again overnight and had a fall without head trauma.  Had some bleeding from his left upper extremity superficial skin tear.  This morning still has some respiratory distress with minimal exertion.  Review of Systems Otherwise negative except as per HPI, including: General = no fevers, chills, dizziness,  fatigue HEENT/EYES = negative for loss of vision, double vision, blurred vision,  sore throa Cardiovascular= negative for chest pain, palpitation Respiratory/lungs= negative for   hemoptysis,  Gastrointestinal= negative for nausea, vomiting, abdominal pain Genitourinary= negative for Dysuria MSK = Negative for arthralgia, myalgias Neurology= Negative for headache, numbness, tingling  Psychiatry= Negative for suicidal and homocidal ideation Skin= Negative for Rash    Examination: Constitutional: Not in acute distress, 5 L nasal cannula Respiratory: Bilateral diffuse rhonchi Cardiovascular: Normal sinus rhythm, no rubs Abdomen: Nontender nondistended good bowel sounds Musculoskeletal: Left knee swelling, limited range of motion.  Right knee looks okay. Skin: Left upper extremity superficial skin tear with ongoing prolonged bleeding Neurologic: CN 2-12 grossly intact.  And  nonfocal Psychiatric: Normal judgment and insight. Alert and oriented x 3. Normal mood.  Objective: Vitals:   03/01/20 1947 03/01/20 2009 03/02/20 0734 03/02/20 0908  BP: 123/70   135/82  Pulse: 95   99  Resp: 20   (!) 21  Temp: 97.8 F (36.6 C)   98.7 F (37.1 C)  TempSrc: Oral     SpO2: 90% 91% 92% 94%    Intake/Output Summary (Last 24 hours) at 03/02/2020 1047 Last data filed at 03/01/2020 1700 Gross per 24 hour  Intake 510 ml  Output --  Net 510 ml   There were no vitals filed for this visit.   Data Reviewed:   CBC: Recent Labs  Lab 02/27/20 2240 02/28/20 0234 02/29/20 0838 03/01/20 0204 03/02/20 0223  WBC 15.6* 15.0* 12.5* 9.5 20.0*  HGB 15.4 15.3 15.0 15.6 15.8  HCT 48.5 47.9 47.2 47.8 48.8  MCV 88.3 88.2 87.7 87.4 86.4  PLT 290 268 200 288 696   Basic Metabolic Panel: Recent Labs  Lab 02/27/20 1408 02/27/20 1408 02/27/20 1817 02/27/20 2240 02/28/20 0234 02/29/20 0838 03/01/20 0204 03/02/20 0223  NA 141   < > 139  --  142 140 140 142  K 3.6   < > 4.8  --  3.6 3.8 4.8 4.7  CL 103  --   --   --  107 101 104 105  CO2 29  --   --   --  25 29 26 26   GLUCOSE 117*  --   --   --  107* 90 135* 140*  BUN 13  --   --   --  14 12 15 17   CREATININE 0.79   < >  --  0.88 0.72 0.68 0.77 0.73  CALCIUM 9.6  --   --   --  8.9 9.0 9.3 9.6  MG  --   --   --   --  1.7  --  1.9 2.1   < > = values in this interval not displayed.   GFR: CrCl cannot be calculated (Unknown ideal weight.). Liver Function Tests: No results for input(s): AST, ALT, ALKPHOS, BILITOT, PROT, ALBUMIN in the last 168 hours. No results for input(s): LIPASE, AMYLASE in the last 168 hours. No results for input(s): AMMONIA in the last 168 hours. Coagulation Profile: Recent Labs  Lab 02/27/20 1806 02/28/20 0234 02/29/20 0838 03/01/20 0204 03/02/20 0223  INR 2.0* 2.1* 2.9* 3.2* 2.8*   Cardiac Enzymes: No results for input(s): CKTOTAL, CKMB, CKMBINDEX, TROPONINI in the last 168  hours. BNP (last 3 results) No results for input(s): PROBNP in the last 8760 hours. HbA1C: No results for input(s): HGBA1C in the last 72 hours. CBG: No results for input(s): GLUCAP in the last 168 hours. Lipid Profile: No results for input(s): CHOL, HDL, LDLCALC, TRIG, CHOLHDL, LDLDIRECT in the last 72 hours. Thyroid Function Tests: No results for input(s): TSH, T4TOTAL, FREET4, T3FREE, THYROIDAB in the last 72 hours. Anemia Panel: No results for input(s): VITAMINB12, FOLATE, FERRITIN, TIBC, IRON, RETICCTPCT in the last 72 hours. Sepsis Labs: Recent Labs  Lab 02/29/20 0838  PROCALCITON <0.10    Recent Results (from the past 240 hour(s))  SARS CORONAVIRUS 2 (TAT 6-24 HRS) Nasopharyngeal Nasopharyngeal Swab     Status: None   Collection Time: 02/27/20  6:48 PM   Specimen: Nasopharyngeal Swab  Result Value Ref Range Status   SARS Coronavirus 2 NEGATIVE NEGATIVE Final    Comment: (NOTE) SARS-CoV-2 target nucleic acids are NOT DETECTED. The SARS-CoV-2 RNA is generally detectable in upper and lower respiratory specimens during  the acute phase of infection. Negative results do not preclude SARS-CoV-2 infection, do not rule out co-infections with other pathogens, and should not be used as the sole basis for treatment or other patient management decisions. Negative results must be combined with clinical observations, patient history, and epidemiological information. The expected result is Negative. Fact Sheet for Patients: SugarRoll.be Fact Sheet for Healthcare Providers: https://www.woods-mathews.com/ This test is not yet approved or cleared by the [place] FDA and  has been authorized for detection and/or diagnosis of SARS-CoV-2 by FDA under an Emergency Use Authorization (EUA). This EUA will remain  in effect (meaning this test can be used) for the duration of the COVID-19 declaration under Section 56 4(b)(1) of the Act, 21  U.S.C. section 360bbb-3(b)(1), unless the authorization is terminated or revoked sooner. Performed at [facility], [place] [address] [place], [place] [address]   C Difficile Quick Screen w PCR reflex     Status: Abnormal   Collection Time: 02/27/20  6:48 PM  Result Value Ref Range Status   C Diff antigen POSITIVE (A) NEGATIVE Final   C Diff toxin NEGATIVE NEGATIVE Final   C Diff interpretation Results are indeterminate. See PCR results.  Final    Comment: Performed at [facility], [place] [address] [place], [place] [address]  C. Diff by PCR, Reflexed     Status: None   Collection Time: 02/27/20  6:48 PM  Result Value Ref Range Status   Toxigenic C. Difficile by PCR NEGATIVE NEGATIVE Final    Comment: Patient is colonized with non toxigenic C. difficile. May not need treatment unless significant symptoms are present. Performed at [facility], [place] [address], [place], [place] [address]   Blood culture (routine x 2)     Status: None (Preliminary result)   Collection Time: 02/27/20  6:49 PM   Specimen: BLOOD  Result Value Ref Range Status   Specimen Description BLOOD BLOOD RIGHT FOREARM  Final   Special Requests   Final    BOTTLES DRAWN AEROBIC AND ANAEROBIC Blood Culture adequate volume   Culture   Final    NO GROWTH 3 DAYS Performed at [facility], [place] [address] [place], [place] [address]    Report Status PENDING  Incomplete  Blood culture (routine x 2)     Status: None (Preliminary result)   Collection Time: 02/27/20  7:50 PM   Specimen: BLOOD LEFT ARM  Result Value Ref Range Status   Specimen Description BLOOD LEFT ARM  Final   Special Requests   Final    BOTTLES DRAWN AEROBIC AND ANAEROBIC Blood Culture adequate volume   Culture   Final    NO GROWTH 3 DAYS Performed at [facility], [place] [address], [place], [place] [address]    Report Status PENDING  Incomplete         Radiology Studies: CT HEAD WO CONTRAST  Result Date:  03/01/2020 CLINICAL DATA:  Unwitnessed fall EXAM: CT HEAD WITHOUT CONTRAST TECHNIQUE: Contiguous axial images were obtained from the base of the skull through the vertex without intravenous contrast. COMPARISON:  02/27/2020 FINDINGS: Brain: No evidence of acute infarction, hemorrhage, hydrocephalus, extra-axial collection or mass lesion/mass effect. Bilateral parietooccipital encephalomalacia, left greater than right. Additional small left cerebellar remote infarct. Scattered low-density changes within the periventricular and subcortical white matter compatible with chronic microvascular ischemic change. Moderate diffuse cerebral volume loss. Vascular: Atherosclerotic calcifications involving the large vessels of the skull base. No unexpected  hyperdense vessel. Skull: Negative for calvarial fracture. Sinuses/Orbits: Mucoperiosteal thickening within the bilateral ethmoid air cells and right maxillary sinus. Progressive opacification of the right maxillary sinus with air-fluid level. Orbital structures are intact. Other: None. IMPRESSION: 1.  No acute intracranial findings. 2.  Acute on chronic right maxillary sinusitis. 3. Chronic microvascular ischemic change and cerebral volume loss, stable from prior. Electronically Signed   By: Davina Poke D.O.   On: 03/01/2020 08:50   DG Chest Port 1 View  Result Date: 03/01/2020 CLINICAL DATA:  Dyspnea EXAM: PORTABLE CHEST 1 VIEW COMPARISON:  02/27/2020 FINDINGS: Stable positioning of right-sided implanted cardiac device. Stable mild cardiomegaly. Patchy multifocal airspace opacities most pronounced within the inferior aspect of the right upper lobe and the bilateral lung bases. No appreciable interval change from prior. No pleural effusion or pneumothorax. IMPRESSION: Stable patchy multifocal airspace opacities without significant interval change. Electronically Signed   By: Davina Poke D.O.   On: 03/01/2020 08:00     Scheduled Meds: . acidophilus  1 capsule  Oral Daily  . atorvastatin  80 mg Oral Q M,W,F  . budesonide (PULMICORT) nebulizer solution  0.5 mg Nebulization BID  . finasteride  5 mg Oral Daily  . ipratropium-albuterol  3 mL Nebulization QID  . methylPREDNISolone (SOLU-MEDROL) injection  40 mg Intravenous Q8H  . metoprolol tartrate  37.5 mg Oral BID  . mirabegron ER  50 mg Oral QPM  . sertraline  50 mg Oral Daily  . tamsulosin  0.4 mg Oral QPC supper  . umeclidinium bromide  1 puff Inhalation Daily  . warfarin  2 mg Oral ONCE-1800  . Warfarin - Pharmacist Dosing Inpatient   Does not apply q1800   Continuous Infusions: . cefTRIAXone (ROCEPHIN)  IV 2 g (03/02/20 0820)     LOS: 3 days   Time spent= 35 mins    Vinessa Macconnell Arsenio Loader, MD [facility]  If 7PM-7AM, please contact night-coverage  03/02/2020, 10:47 AM

## 2020-03-02 NOTE — Progress Notes (Addendum)
At the beginning of this RNs shift, this RN noticed the wound on the patient's arm that was sustained during the fall was bleeding through the foam dressing that was placed after the fall. This RN changed the dressing and it showed that the wound was still actively bleeding with visible clots on the dressing. RN paged night coverage and they stated they would place a consult in for plastics in the morning to address it. Later in this RNs shift, the RN noticed the wound was still bleeding through the dressing. RN changed the dressing on the arm. The wound was still actively bleeding and clots were visible. Night coverage was notified again and no new orders were given. Will continue to monitor.   0519 RN changed dressing once again due to saturation of blood on the dressing. Dressing was cleansed with NS, then Vaseline gauze was applied as well as non-adhesive gauze and a foam dressing on top. The dressings were then wrapped with kerlex gauze.

## 2020-03-02 NOTE — Progress Notes (Signed)
PT Cancellation Note  Patient Details Name: Travis Cruz MRN: [id number] DOB: 31-Jul-1946   Cancelled Treatment:    Reason Eval/Treat Not Completed: Patient at procedure or test/unavailable - pt going to xray presently. Will check back as schedule allows.  [place] Pager [phone]  Office [phone]    [place] 03/02/2020, 11:12 AM

## 2020-03-02 NOTE — Progress Notes (Signed)
ANTICOAGULATION CONSULT NOTE  Pharmacy Consult:  Coumadin  Indication: hx VTE  Allergies  Allergen Reactions  . Ativan [Lorazepam] Anxiety and Other (See Comments)    Hyper  [name]    Vital Signs: Temp: 98.7 F (37.1 C) (03/05 0908) BP: 135/82 (03/05 0908) Pulse Rate: 99 (03/05 0908)  Labs: Recent Labs    02/29/20 0838 02/29/20 0838 03/01/20 0204 03/02/20 0223  HGB 15.0   < > 15.6 15.8  HCT 47.2  --  47.8 48.8  PLT 200  --  288 316  LABPROT 30.2*  --  32.8* 29.8*  INR 2.9*  --  3.2* 2.8*  CREATININE 0.68  --  0.77 0.73   < > = values in this interval not displayed.    CrCl cannot be calculated (Unknown ideal weight.).   Assessment: 77 yom on chronic Coumadin presented to the ED with cough and diarrhea.  Pharmacy consulted to continue Coumadin from PTA.    INR now within range again  Seems to have some arm bleeding overnight that has continued for a few days. CBC is stable.    PTA regimen: 2mg  TTSa and 3mg  MWFSu  Goal of Therapy:  INR 2-3 Monitor platelets by anticoagulation protocol: Yes   Plan:  Give warfarin 2mg  PO x 1 tonight Daily INR, s/s bleeding  Barth Kirks, PharmD, BCPS, BCCCP Clinical Pharmacist [phone]  Please check AMION for all [place] numbers  03/02/2020 10:21 AM

## 2020-03-02 NOTE — Progress Notes (Signed)
Physical Therapy Treatment Patient Details Name: Travis Cruz MRN: [id number] DOB: 04/25/1946 Today's Date: 03/02/2020    History of Present Illness 74 yo male admitted to ED on 3/1 with persistent coughing, diarrhea, acute respiratory failure with hypoxia. CXR reveals multifocal pulmonary opacities, some of which may be chronic,although superimposed pneumonia is suspected. PMH includes dementia, anemia, chronic T6 and T10 compression deformities, ARDS, AICD, CAD with history of MI and cardiac cath, COPD emphysema type, DVT, HTN, cardiomyopathy, lung cancer, OSA, CVA. Unwitnessed fall 3/4, no acute findings on imaging.    PT Comments    Pt ambulated short room distance this session, and required min assist for all mobility today. Pt with no complaints of pain s/p fall on 3/4. SpO2 90% and greater on 4LO2, with dyspnea on exertion that recovers well with rest. PT assisted pt with pericare post-BM on BSC, and praticed repeated sit to stands with pt for functional transfer training. PT to continue to follow acutely.   Follow Up Recommendations  SNF;Supervision/Assistance - 24 hour     Equipment Recommendations  Other (comment)(defer)    Recommendations for Other Services       Precautions / Restrictions Precautions Precautions: Fall;ICD/Pacemaker Restrictions Weight Bearing Restrictions: No    Mobility  Bed Mobility Overal bed mobility: Needs Assistance Bed Mobility: Supine to Sit;Sit to Supine     Supine to sit: Min assist;HOB elevated Sit to supine: Min assist;HOB elevated   General bed mobility comments: Min assist for supine<>sit for trunk assist, light LE assist. Verbal cuing for sequencing and moving towards EOB.  Transfers Overall transfer level: Needs assistance Equipment used: Rolling walker (2 wheeled) Transfers: Sit to/from Stand Sit to Stand: From elevated surface;Min assist         General transfer comment: Min assist for power up, steadying. Verbal cuing  for safe hand placement. Sit to standx3, from EOB x2 and from [facility] x1.  Ambulation/Gait Ambulation/Gait assistance: Min assist Gait Distance (Feet): 5 Feet(2x5 ft, to [facility] close to bed) Assistive device: Rolling walker (2 wheeled) Gait Pattern/deviations: Step-through pattern;Decreased stride length;Narrow base of support Gait velocity: decr   General Gait Details: Min assist for steadying, guiding pt trajectory to and from [facility]. Verbal cuing for placement in RW, upright posture. Pt with narrow BOS with LEs kept stiff suspect for stability.   Stairs             Wheelchair Mobility    Modified Rankin (Stroke Patients Only)       Balance Overall balance assessment: Needs assistance Sitting-balance support: No upper extremity supported;Feet supported Sitting balance-Leahy Scale: Fair Sitting balance - Comments: Sat EOB without PT support   Standing balance support: Bilateral upper extremity supported;During functional activity Standing balance-Leahy Scale: Poor Standing balance comment: reliant on external support in standing                            Cognition Arousal/Alertness: Awake/alert Behavior During Therapy: WFL for tasks assessed/performed Overall Cognitive Status: History of cognitive impairments - at baseline                                 General Comments: History of dementia at baseline, A&O x4 and recalls fall yesterday morning. Pt follows commands well, increased processing time noted with difficulty sequencing.      Exercises      General Comments General comments (skin integrity, edema, etc.):  SpO2 90% and greater on 4LO2, DOE 2/4 with initial supine to sit and with exertion to walk to toilet. HRmax 114 bpm.      Pertinent Vitals/Pain Pain Assessment: No/denies pain    Home Living                      Prior Function            PT Goals (current goals can now be found in the care plan section) Acute Rehab PT  Goals Patient Stated Goal: feel better PT Goal Formulation: With patient Time For Goal Achievement: 03/13/20 Potential to Achieve Goals: Good Progress towards PT goals: Progressing toward goals    Frequency    Min 2X/week      PT Plan Current plan remains appropriate    Co-evaluation              AM-PAC PT "6 Clicks" Mobility   Outcome Measure  Help needed turning from your back to your side while in a flat bed without using bedrails?: A Little Help needed moving from lying on your back to sitting on the side of a flat bed without using bedrails?: A Little Help needed moving to and from a bed to a chair (including a wheelchair)?: A Little Help needed standing up from a chair using your arms (e.g., wheelchair or bedside chair)?: A Little Help needed to walk in hospital room?: A Lot Help needed climbing 3-5 steps with a railing? : Total 6 Click Score: 15    End of Session Equipment Utilized During Treatment: Gait belt;Oxygen Activity Tolerance: Patient tolerated treatment well;Patient limited by fatigue Patient left: in bed;with call bell/phone within reach;with bed alarm set Nurse Communication: Mobility status PT Visit Diagnosis: Other abnormalities of gait and mobility (R26.89);Muscle weakness (generalized) (M62.81);Difficulty in walking, not elsewhere classified (R26.2)     Time: 7616-0737 PT Time Calculation (min) (ACUTE ONLY): 28 min  Charges:  $Therapeutic Activity: 8-22 mins $Self Care/Home Management: 8-22                    Marisa Cyphers, PT Acute Rehabilitation Services Pager [phone]  Office [phone]  Zyhir Cappella D Elonda Husky 03/02/2020, 5:37 PM

## 2020-03-03 LAB — CBC
HCT: 47.9 % (ref 39.0–52.0)
Hemoglobin: 15.2 g/dL (ref 13.0–17.0)
MCH: 28 pg (ref 26.0–34.0)
MCHC: 31.7 g/dL (ref 30.0–36.0)
MCV: 88.4 fL (ref 80.0–100.0)
Platelets: 319 10*3/uL (ref 150–400)
RBC: 5.42 MIL/uL (ref 4.22–5.81)
RDW: 17.1 % — ABNORMAL HIGH (ref 11.5–15.5)
WBC: 21.1 10*3/uL — ABNORMAL HIGH (ref 4.0–10.5)
nRBC: 0 % (ref 0.0–0.2)

## 2020-03-03 LAB — CULTURE, BLOOD (ROUTINE X 2)
Culture: NO GROWTH
Culture: NO GROWTH
Special Requests: ADEQUATE
Special Requests: ADEQUATE

## 2020-03-03 LAB — URIC ACID: Uric Acid, Serum: 3.7 mg/dL (ref 3.7–8.6)

## 2020-03-03 LAB — PROTIME-INR
INR: 3.5 — ABNORMAL HIGH (ref 0.8–1.2)
Prothrombin Time: 35.3 seconds — ABNORMAL HIGH (ref 11.4–15.2)

## 2020-03-03 LAB — BASIC METABOLIC PANEL
Anion gap: 9 (ref 5–15)
BUN: 24 mg/dL — ABNORMAL HIGH (ref 8–23)
CO2: 28 mmol/L (ref 22–32)
Calcium: 9.1 mg/dL (ref 8.9–10.3)
Chloride: 101 mmol/L (ref 98–111)
Creatinine, Ser: 0.7 mg/dL (ref 0.61–1.24)
GFR calc Af Amer: >60 mL/min (ref >60)
GFR calc non Af Amer: >60 mL/min (ref >60)
Glucose, Bld: 131 mg/dL — ABNORMAL HIGH (ref 70–99)
Potassium: 4.4 mmol/L (ref 3.5–5.1)
Sodium: 138 mmol/L (ref 135–145)

## 2020-03-03 LAB — MAGNESIUM: Magnesium: 2 mg/dL (ref 1.7–2.4)

## 2020-03-03 MED ORDER — FUROSEMIDE 10 MG/ML IJ SOLN
40.0000 mg | Freq: Once | INTRAMUSCULAR | Status: AC
  Administered 2020-03-03: 40 mg via INTRAVENOUS
  Filled 2020-03-03: qty 4

## 2020-03-03 MED ORDER — DM-GUAIFENESIN ER 30-600 MG PO TB12
1.0000 | ORAL_TABLET | Freq: Two times a day (BID) | ORAL | Status: DC | PRN
  Administered 2020-03-05: 1 via ORAL
  Filled 2020-03-03: qty 1

## 2020-03-03 NOTE — Progress Notes (Signed)
ANTICOAGULATION CONSULT NOTE  Pharmacy Consult:  Coumadin  Indication: hx VTE  Allergies  Allergen Reactions  . Ativan [Lorazepam] Anxiety and Other (See Comments)    Hyper  [name]    Vital Signs: Temp: 98.1 F (36.7 C) (03/06 0812) Temp Source: Oral (03/06 0812) BP: 139/76 (03/06 0812) Pulse Rate: 73 (03/06 0812)  Labs: Recent Labs    03/01/20 0204 03/01/20 0204 03/02/20 0223 03/03/20 0217  HGB 15.6   < > 15.8 15.2  HCT 47.8  --  48.8 47.9  PLT 288  --  316 319  LABPROT 32.8*  --  29.8* 35.3*  INR 3.2*  --  2.8* 3.5*  CREATININE 0.77  --  0.73 0.70   < > = values in this interval not displayed.    CrCl cannot be calculated (Unknown ideal weight.).   Assessment: 74 yom on chronic Coumadin (history of PE)  presented to the ED with cough and diarrhea.  Pharmacy consulted to continue Coumadin from PTA.   -INR 3.5 with trend up  PTA regimen: 2mg  TTSa and 3mg  MWFSu  Goal of Therapy:  INR 2-3 Monitor platelets by anticoagulation protocol: Yes   Plan:  Hold coumadin today Daily INR  Hildred Laser, PharmD Clinical Pharmacist **Pharmacist phone directory can now be found on [email or web address] ([email or web address]).  Listed under [place].

## 2020-03-03 NOTE — Progress Notes (Signed)
PROGRESS NOTE    Travis Cruz  QTM:[id number] DOB: 1946/10/14 DOA: 02/27/2020 PCP: Dettinger, Fransisca Kaufmann, MD   Brief Narrative:  74 year old with history of CAD, pulmonary embolism on Coumadin, COPD, vascular dementia, CHF presented to the hospital with productive cough for the last several days and diarrhea.  Upon admission chest x-ray showed multifocal infiltrate on the right side which was confirmed on the CT of the chest.  Empirically started on antibiotics.  Had a fall overnight, CT of the head is negative.  Superficial skin tearing of the left upper extremity, left knee swelling.   Assessment & Plan:   Principal Problem:   Acute respiratory failure with hypoxia (HCC) Active Problems:   COPD (chronic obstructive pulmonary disease) (HCC)   History of pulmonary embolus (PE)   History of CVA (cerebrovascular accident)   Chronic diastolic CHF (congestive heart failure) ([place])   Hypertension   Primary cancer of right upper lobe of lung ([place])   Diarrhea   CAP (community acquired pneumonia)   Pneumonia  Acute hypoxic respiratory failure secondary to COPD Acute on chronic COPD exacerbation, moderate Continues to be dyspneic even at rest. On 5 L nasal cannula.  Continue aggressive bronchodilator therapy, IV Solu-Medrol, incentive spirometer, flutter valve. Advised RN to give him IS and Flutter. Aggressive pulmonary toilet. -CT chest is negative for PE but shows advanced emphysema -Supplemental oxygen and supportive care. -Procalcitonin negative.  BNP-76. -Urine strep-negative.  Chest x-ray shows stable patchy bilateral multifocal opacities -Lasix 40 mg IV once  Episode of fall Left knee swelling -CT head negative. No focal neuro deficits. -Some left upper extremity bleeding/superficial skin tear. Dressing in place -Left knee x-ray-negative. -Fall precaution, bed alarm.  If necessary will place sitter especially at night. -Check uric acid  Acute on chronic chronic hypoxia on 2 L  nasal cannula -Currently on 5 L nasal cannula  Diarrhea -Resolved, C. difficile toxin negative, positive antigen, likely colonized.  Chronic diastolic congestive heart failure, EF greater than 65% -BNP normal. But due to shortness of breath, given Lasix 40 mg once IV  History of pulmonary embolism -On Coumadin  Coronary artery disease -Without chest pain  DVT prophylaxis: Coumadin Code Status: Full code Family Communication:  Wife updated.  Disposition Plan:   Patient From= home  Patient Anticipated D/C place= PT recommending SNF  Barriers= unsafe for discharge, still gets quite dyspneic at rest requiring aggressive bronchodilator therapy and IV steroids.   Subjective: Still having exertional dyspnea a lot of coughing but unable to bring up anything.  Review of Systems Otherwise negative except as per HPI, including: General = no fevers, chills, dizziness,  fatigue HEENT/EYES = negative for loss of vision, double vision, blurred vision,  sore throa Cardiovascular= negative for chest pain, palpitation Respiratory/lungs= negative for wheezing; hemoptysis,  Gastrointestinal= negative for nausea, vomiting, abdominal pain Genitourinary= negative for Dysuria MSK = Negative for arthralgia, myalgias Neurology= Negative for headache, numbness, tingling  Psychiatry= Negative for suicidal and homocidal ideation Skin= Negative for Rash   Examination: Constitutional: Not in acute distress, 5 L nasal cannula Respiratory: Bilateral diffuse rhonchi Cardiovascular: Normal sinus rhythm, no rubs Abdomen: Nontender nondistended good bowel sounds Musculoskeletal: Chronic deformity of bilateral knees, left knee slightly swollen but x-rays are negative Skin: Superficial skin tear of left lower extremity Neurologic: CN 2-12 grossly intact.  And nonfocal Psychiatric: Normal judgment and insight. Alert and oriented x 3. Normal mood.     Objective: Vitals:   03/02/20 2242 03/03/20 6256  03/03/20 3893 03/03/20 7342  BP: (!) 146/94   139/76  Pulse: 88   73  Resp: 20   18  Temp: 97.7 F (36.5 C)   98.1 F (36.7 C)  TempSrc: Axillary   Oral  SpO2:  90% 96% 93%   No intake or output data in the 24 hours ending 03/03/20 1115 There were no vitals filed for this visit.   Data Reviewed:   CBC: Recent Labs  Lab 02/28/20 0234 02/29/20 0539 03/01/20 0204 03/02/20 0223 03/03/20 0217  WBC 15.0* 12.5* 9.5 20.0* 21.1*  HGB 15.3 15.0 15.6 15.8 15.2  HCT 47.9 47.2 47.8 48.8 47.9  MCV 88.2 87.7 87.4 86.4 88.4  PLT 268 200 288 316 767   Basic Metabolic Panel: Recent Labs  Lab 02/28/20 0234 02/29/20 0838 03/01/20 0204 03/02/20 0223 03/03/20 0217  NA 142 140 140 142 138  K 3.6 3.8 4.8 4.7 4.4  CL 107 101 104 105 101  CO2 25 29 26 26 28   GLUCOSE 107* 90 135* 140* 131*  BUN 14 12 15 17  24*  CREATININE 0.72 0.68 0.77 0.73 0.70  CALCIUM 8.9 9.0 9.3 9.6 9.1  MG 1.7  --  1.9 2.1 2.0   GFR: CrCl cannot be calculated (Unknown ideal weight.). Liver Function Tests: No results for input(s): AST, ALT, ALKPHOS, BILITOT, PROT, ALBUMIN in the last 168 hours. No results for input(s): LIPASE, AMYLASE in the last 168 hours. No results for input(s): AMMONIA in the last 168 hours. Coagulation Profile: Recent Labs  Lab 02/28/20 0234 02/29/20 0838 03/01/20 0204 03/02/20 0223 03/03/20 0217  INR 2.1* 2.9* 3.2* 2.8* 3.5*   Cardiac Enzymes: No results for input(s): CKTOTAL, CKMB, CKMBINDEX, TROPONINI in the last 168 hours. BNP (last 3 results) No results for input(s): PROBNP in the last 8760 hours. HbA1C: No results for input(s): HGBA1C in the last 72 hours. CBG: No results for input(s): GLUCAP in the last 168 hours. Lipid Profile: No results for input(s): CHOL, HDL, LDLCALC, TRIG, CHOLHDL, LDLDIRECT in the last 72 hours. Thyroid Function Tests: No results for input(s): TSH, T4TOTAL, FREET4, T3FREE, THYROIDAB in the last 72 hours. Anemia Panel: No results for input(s):  VITAMINB12, FOLATE, FERRITIN, TIBC, IRON, RETICCTPCT in the last 72 hours. Sepsis Labs: Recent Labs  Lab 02/29/20 0838  PROCALCITON <0.10    Recent Results (from the past 240 hour(s))  SARS CORONAVIRUS 2 (TAT 6-24 HRS) Nasopharyngeal Nasopharyngeal Swab     Status: None   Collection Time: 02/27/20  6:48 PM   Specimen: Nasopharyngeal Swab  Result Value Ref Range Status   SARS Coronavirus 2 NEGATIVE NEGATIVE Final    Comment: (NOTE) SARS-CoV-2 target nucleic acids are NOT DETECTED. The SARS-CoV-2 RNA is generally detectable in upper and lower respiratory specimens during the acute phase of infection. Negative results do not preclude SARS-CoV-2 infection, do not rule out co-infections with other pathogens, and should not be used as the sole basis for treatment or other patient management decisions. Negative results must be combined with clinical observations, patient history, and epidemiological information. The expected result is Negative. Fact Sheet for Patients: SugarRoll.be Fact Sheet for Healthcare Providers: https://www.woods-mathews.com/ This test is not yet approved or cleared by the [place] FDA and  has been authorized for detection and/or diagnosis of SARS-CoV-2 by FDA under an Emergency Use Authorization (EUA). This EUA will remain  in effect (meaning this test can be used) for the duration of the COVID-19 declaration under Section 56 4(b)(1) of the Act, 21 U.S.C. section 360bbb-3(b)(1), unless the  authorization is terminated or revoked sooner. Performed at [facility], [place] [address] [place], [place] [address]   C Difficile Quick Screen w PCR reflex     Status: Abnormal   Collection Time: 02/27/20  6:48 PM  Result Value Ref Range Status   C Diff antigen POSITIVE (A) NEGATIVE Final   C Diff toxin NEGATIVE NEGATIVE Final   C Diff interpretation Results are indeterminate. See PCR results.  Final    Comment:  Performed at [facility], [place] [address], [place], [place] [address]  C. Diff by PCR, Reflexed     Status: None   Collection Time: 02/27/20  6:48 PM  Result Value Ref Range Status   Toxigenic C. Difficile by PCR NEGATIVE NEGATIVE Final    Comment: Patient is colonized with non toxigenic C. difficile. May not need treatment unless significant symptoms are present. Performed at [facility], [place] [address] [place], [place] [address]   Blood culture (routine x 2)     Status: None (Preliminary result)   Collection Time: 02/27/20  6:49 PM   Specimen: BLOOD  Result Value Ref Range Status   Specimen Description BLOOD BLOOD RIGHT FOREARM  Final   Special Requests   Final    BOTTLES DRAWN AEROBIC AND ANAEROBIC Blood Culture adequate volume   Culture   Final    NO GROWTH 4 DAYS Performed at [facility], [place] [address], [place], [place] [address]    Report Status PENDING  Incomplete  Blood culture (routine x 2)     Status: None (Preliminary result)   Collection Time: 02/27/20  7:50 PM   Specimen: BLOOD LEFT ARM  Result Value Ref Range Status   Specimen Description BLOOD LEFT ARM  Final   Special Requests   Final    BOTTLES DRAWN AEROBIC AND ANAEROBIC Blood Culture adequate volume   Culture   Final    NO GROWTH 4 DAYS Performed at [facility], [place] [address], [place], [place] [address]    Report Status PENDING  Incomplete         Radiology Studies: DG Knee 1-2 Views Left  Result Date: 03/02/2020 CLINICAL DATA:  Medial left knee pain EXAM: LEFT KNEE - 1-2 VIEW COMPARISON:  12/16/2019 FINDINGS: Redemonstration of extensive fixation hardware within the distal femur and proximal tibia. The visualized portions of the hardware appear intact without perihardware lucency or acute fracture. Extensive posttraumatic changes to the femur tibia at and fibula. Severe tricompartmental degenerative changes of the left knee. No new areas of cortical destruction or periostitis  evident. Diffuse osseous demineralization. Similar prominence of the soft tissues, which may be postsurgical related to skin grafting. No soft tissue gas. IMPRESSION: No significant change in the appearance of the left knee with chronic posttraumatic, postsurgical, and degenerative changes. Electronically Signed   By: Davina Poke D.O.   On: 03/02/2020 11:59     Scheduled Meds: . acidophilus  1 capsule Oral Daily  . atorvastatin  80 mg Oral Q M,W,F  . budesonide (PULMICORT) nebulizer solution  0.5 mg Nebulization BID  . finasteride  5 mg Oral Daily  . furosemide  40 mg Intravenous Once  . ipratropium-albuterol  3 mL Nebulization QID  . methylPREDNISolone (SOLU-MEDROL) injection  40 mg Intravenous Q8H  . metoprolol tartrate  37.5 mg Oral BID  . mirabegron ER  50 mg Oral QPM  . sertraline  50 mg Oral Daily  . tamsulosin  0.4 mg  Oral QPC supper  . umeclidinium bromide  1 puff Inhalation Daily  . Warfarin - Pharmacist Dosing Inpatient   Does not apply q1800   Continuous Infusions:    LOS: 4 days   Time spent= 35 mins    Alexsandro Salek Arsenio Loader, MD [facility]  If 7PM-7AM, please contact night-coverage  03/03/2020, 11:15 AM

## 2020-03-04 DIAGNOSIS — J441 Chronic obstructive pulmonary disease with (acute) exacerbation: Secondary | ICD-10-CM | POA: Diagnosis not present

## 2020-03-04 DIAGNOSIS — Z86711 Personal history of pulmonary embolism: Secondary | ICD-10-CM | POA: Diagnosis not present

## 2020-03-04 DIAGNOSIS — F0390 Unspecified dementia without behavioral disturbance: Secondary | ICD-10-CM | POA: Diagnosis not present

## 2020-03-04 DIAGNOSIS — I5032 Chronic diastolic (congestive) heart failure: Secondary | ICD-10-CM | POA: Diagnosis not present

## 2020-03-04 DIAGNOSIS — J189 Pneumonia, unspecified organism: Secondary | ICD-10-CM | POA: Diagnosis not present

## 2020-03-04 DIAGNOSIS — J9621 Acute and chronic respiratory failure with hypoxia: Secondary | ICD-10-CM | POA: Diagnosis not present

## 2020-03-04 DIAGNOSIS — J449 Chronic obstructive pulmonary disease, unspecified: Secondary | ICD-10-CM | POA: Diagnosis not present

## 2020-03-04 DIAGNOSIS — J411 Mucopurulent chronic bronchitis: Secondary | ICD-10-CM | POA: Diagnosis not present

## 2020-03-04 DIAGNOSIS — J9601 Acute respiratory failure with hypoxia: Secondary | ICD-10-CM

## 2020-03-04 LAB — CBC
HCT: 51 % (ref 39.0–52.0)
Hemoglobin: 16.4 g/dL (ref 13.0–17.0)
MCH: 27.7 pg (ref 26.0–34.0)
MCHC: 32.2 g/dL (ref 30.0–36.0)
MCV: 86 fL (ref 80.0–100.0)
Platelets: 336 10*3/uL (ref 150–400)
RBC: 5.93 MIL/uL — ABNORMAL HIGH (ref 4.22–5.81)
RDW: 17.1 % — ABNORMAL HIGH (ref 11.5–15.5)
WBC: 20 10*3/uL — ABNORMAL HIGH (ref 4.0–10.5)
nRBC: 0 % (ref 0.0–0.2)

## 2020-03-04 LAB — BASIC METABOLIC PANEL
Anion gap: 11 (ref 5–15)
BUN: 28 mg/dL — ABNORMAL HIGH (ref 8–23)
CO2: 33 mmol/L — ABNORMAL HIGH (ref 22–32)
Calcium: 9.5 mg/dL (ref 8.9–10.3)
Chloride: 96 mmol/L — ABNORMAL LOW (ref 98–111)
Creatinine, Ser: 0.92 mg/dL (ref 0.61–1.24)
GFR calc Af Amer: >60 mL/min (ref >60)
GFR calc non Af Amer: >60 mL/min (ref >60)
Glucose, Bld: 135 mg/dL — ABNORMAL HIGH (ref 70–99)
Potassium: 4.2 mmol/L (ref 3.5–5.1)
Sodium: 140 mmol/L (ref 135–145)

## 2020-03-04 LAB — PROTIME-INR
INR: 3.4 — ABNORMAL HIGH (ref 0.8–1.2)
Prothrombin Time: 34.1 seconds — ABNORMAL HIGH (ref 11.4–15.2)

## 2020-03-04 LAB — MAGNESIUM: Magnesium: 2.1 mg/dL (ref 1.7–2.4)

## 2020-03-04 MED ORDER — ARFORMOTEROL TARTRATE 15 MCG/2ML IN NEBU
15.0000 ug | INHALATION_SOLUTION | Freq: Two times a day (BID) | RESPIRATORY_TRACT | Status: DC
  Administered 2020-03-04 – 2020-03-09 (×10): 15 ug via RESPIRATORY_TRACT
  Filled 2020-03-04 (×10): qty 2

## 2020-03-04 MED ORDER — AZITHROMYCIN 250 MG PO TABS
500.0000 mg | ORAL_TABLET | Freq: Every day | ORAL | Status: AC
  Administered 2020-03-04 – 2020-03-08 (×5): 500 mg via ORAL
  Filled 2020-03-04 (×5): qty 2
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
## 2020-03-04 NOTE — Progress Notes (Addendum)
PROGRESS NOTE    Travis Cruz  VCB:[id number] DOB: 1946/07/11 DOA: 02/27/2020 PCP: Dettinger, Fransisca Kaufmann, MD   Brief Narrative:  73 year old with history of CAD, pulmonary embolism on Coumadin, COPD, vascular dementia, CHF presented to the hospital with productive cough for the last several days and diarrhea.  Upon admission chest x-ray showed multifocal infiltrate on the right side which was confirmed on the CT of the chest.  Empirically started on antibiotics.  Had a fall overnight, CT of the head is negative.  Superficial skin tearing of the left upper extremity, left knee swelling.   Assessment & Plan:   Principal Problem:   Acute respiratory failure with hypoxia (HCC) Active Problems:   COPD (chronic obstructive pulmonary disease) (HCC)   History of pulmonary embolus (PE)   History of CVA (cerebrovascular accident)   Chronic diastolic CHF (congestive heart failure) ([place])   Hypertension   Primary cancer of right upper lobe of lung ([place])   Diarrhea   CAP (community acquired pneumonia)   Pneumonia  Acute hypoxic respiratory failure secondary to COPD Acute on chronic COPD exacerbation, moderate Continues to very dyspneic requiring 5 L nasal cannula.  Continue aggressive bronchodilator therapy, IV Solu-Medrol, incentive spirometer, flutter valve. Advised RN to give him IS and Flutter. Aggressive pulmonary toilet. -CT chest is negative for PE but shows advanced emphysema -Supplemental oxygen and supportive care. -Procalcitonin negative.  BNP-76. -Urine strep-negative.  Chest x-ray shows stable patchy bilateral multifocal opacities -Status post Lasix 3/6 Added Azithromycin for 5 days, D1/5. Pulm Team consulted.   Episode of fall Left knee swelling -CT head negative. No focal neuro deficits. -Some left upper extremity bleeding/superficial skin tear. Dressing in place -Left knee x-ray-negative. -Fall precaution, bed alarm.  If necessary will place sitter especially at night. -Uric  Acid= 3.7  Acute on chronic chronic hypoxia on 2 L nasal cannula -Currently on 5 L nasal cannula  Diarrhea -Resolved, C. difficile toxin negative, positive antigen, likely colonized.  Chronic diastolic congestive heart failure, EF greater than 65% -BNP normal. But due to shortness of breath, given Lasix 40 mg once IV 3/6  History of pulmonary embolism -On Coumadin  Coronary artery disease -Without chest pain  DVT prophylaxis: Coumadin Code Status: Full code Family Communication:  Wife updated.  Disposition Plan:   Patient From= home  Patient Anticipated D/C place= PT recommending SNF  Barriers= unsafe for discharge at this time, he is quite dyspneic requiring aggressive bronchodilators   Subjective: Dyspneic with minimal movement.  When using incentive spirometer and flutter valve he is having significant amount of coughing but unable to bring it out.  Review of Systems Otherwise negative except as per HPI, including: General = no fevers, chills, dizziness,  fatigue HEENT/EYES = negative for loss of vision, double vision, blurred vision,  sore throa Cardiovascular= negative for chest pain, palpitation Respiratory/lungs= negative for  hemoptysis,  Gastrointestinal= negative for nausea, vomiting, abdominal pain Genitourinary= negative for Dysuria MSK = Negative for arthralgia, myalgias Neurology= Negative for headache, numbness, tingling  Psychiatry= Negative for suicidal and homocidal ideation Skin= Negative for Rash  Examination: Constitutional: Not in acute distress; 5L [place] Respiratory: b/l diffuse rhonchi.,  Cardiovascular: Normal sinus rhythm, no rubs Abdomen: Nontender nondistended good bowel sounds Musculoskeletal: No edema noted Skin: No rashes seen Neurologic: CN 2-12 grossly intact.  And nonfocal Psychiatric: Normal judgment and insight. Alert and oriented x 3. Normal mood.   Objective: Vitals:   03/03/20 2000 03/04/20 0405 03/04/20 0719 03/04/20 0813   BP: 127/79  131/87  Pulse: (!) 101   84  Resp:    18  Temp: 98.5 F (36.9 C)   98 F (36.7 C)  TempSrc: Axillary   Oral  SpO2: 94%  97% 93%  Weight:  91.2 kg      Intake/Output Summary (Last 24 hours) at 03/04/2020 1025 Last data filed at 03/04/2020 0900 Gross per 24 hour  Intake 480 ml  Output 2550 ml  Net -2070 ml   Filed Weights   03/04/20 0405  Weight: 91.2 kg     Data Reviewed:   CBC: Recent Labs  Lab 02/29/20 0838 03/01/20 0204 03/02/20 0223 03/03/20 0217 03/04/20 0128  WBC 12.5* 9.5 20.0* 21.1* 20.0*  HGB 15.0 15.6 15.8 15.2 16.4  HCT 47.2 47.8 48.8 47.9 51.0  MCV 87.7 87.4 86.4 88.4 86.0  PLT 200 288 316 319 601   Basic Metabolic Panel: Recent Labs  Lab 02/28/20 0234 02/28/20 0234 02/29/20 0838 03/01/20 0204 03/02/20 0223 03/03/20 0217 03/04/20 0128  NA 142   < > 140 140 142 138 140  K 3.6   < > 3.8 4.8 4.7 4.4 4.2  CL 107   < > 101 104 105 101 96*  CO2 25   < > 29 26 26 28  33*  GLUCOSE 107*   < > 90 135* 140* 131* 135*  BUN 14   < > 12 15 17  24* 28*  CREATININE 0.72   < > 0.68 0.77 0.73 0.70 0.92  CALCIUM 8.9   < > 9.0 9.3 9.6 9.1 9.5  MG 1.7  --   --  1.9 2.1 2.0 2.1   < > = values in this interval not displayed.   GFR: Estimated Creatinine Clearance: 82.2 mL/min (by C-G formula based on SCr of 0.92 mg/dL). Liver Function Tests: No results for input(s): AST, ALT, ALKPHOS, BILITOT, PROT, ALBUMIN in the last 168 hours. No results for input(s): LIPASE, AMYLASE in the last 168 hours. No results for input(s): AMMONIA in the last 168 hours. Coagulation Profile: Recent Labs  Lab 02/29/20 0838 03/01/20 0204 03/02/20 0223 03/03/20 0217 03/04/20 0128  INR 2.9* 3.2* 2.8* 3.5* 3.4*   Cardiac Enzymes: No results for input(s): CKTOTAL, CKMB, CKMBINDEX, TROPONINI in the last 168 hours. BNP (last 3 results) No results for input(s): PROBNP in the last 8760 hours. HbA1C: No results for input(s): HGBA1C in the last 72 hours. CBG: No results  for input(s): GLUCAP in the last 168 hours. Lipid Profile: No results for input(s): CHOL, HDL, LDLCALC, TRIG, CHOLHDL, LDLDIRECT in the last 72 hours. Thyroid Function Tests: No results for input(s): TSH, T4TOTAL, FREET4, T3FREE, THYROIDAB in the last 72 hours. Anemia Panel: No results for input(s): VITAMINB12, FOLATE, FERRITIN, TIBC, IRON, RETICCTPCT in the last 72 hours. Sepsis Labs: Recent Labs  Lab 02/29/20 0838  PROCALCITON <0.10    Recent Results (from the past 240 hour(s))  SARS CORONAVIRUS 2 (TAT 6-24 HRS) Nasopharyngeal Nasopharyngeal Swab     Status: None   Collection Time: 02/27/20  6:48 PM   Specimen: Nasopharyngeal Swab  Result Value Ref Range Status   SARS Coronavirus 2 NEGATIVE NEGATIVE Final    Comment: (NOTE) SARS-CoV-2 target nucleic acids are NOT DETECTED. The SARS-CoV-2 RNA is generally detectable in upper and lower respiratory specimens during the acute phase of infection. Negative results do not preclude SARS-CoV-2 infection, do not rule out co-infections with other pathogens, and should not be used as the sole basis for treatment or other patient management decisions.  Negative results must be combined with clinical observations, patient history, and epidemiological information. The expected result is Negative. Fact Sheet for Patients: SugarRoll.be Fact Sheet for Healthcare Providers: https://www.woods-mathews.com/ This test is not yet approved or cleared by the [place] FDA and  has been authorized for detection and/or diagnosis of SARS-CoV-2 by FDA under an Emergency Use Authorization (EUA). This EUA will remain  in effect (meaning this test can be used) for the duration of the COVID-19 declaration under Section 56 4(b)(1) of the Act, 21 U.S.C. section 360bbb-3(b)(1), unless the authorization is terminated or revoked sooner. Performed at [facility], [place] [address], [place], [place] [address]   C  Difficile Quick Screen w PCR reflex     Status: Abnormal   Collection Time: 02/27/20  6:48 PM  Result Value Ref Range Status   C Diff antigen POSITIVE (A) NEGATIVE Final   C Diff toxin NEGATIVE NEGATIVE Final   C Diff interpretation Results are indeterminate. See PCR results.  Final    Comment: Performed at [facility], [place] [address], [place], [place] [address]  C. Diff by PCR, Reflexed     Status: None   Collection Time: 02/27/20  6:48 PM  Result Value Ref Range Status   Toxigenic C. Difficile by PCR NEGATIVE NEGATIVE Final    Comment: Patient is colonized with non toxigenic C. difficile. May not need treatment unless significant symptoms are present. Performed at [facility], [place] [address], [place], [place] [address]   Blood culture (routine x 2)     Status: None   Collection Time: 02/27/20  6:49 PM   Specimen: BLOOD  Result Value Ref Range Status   Specimen Description BLOOD BLOOD RIGHT FOREARM  Final   Special Requests   Final    BOTTLES DRAWN AEROBIC AND ANAEROBIC Blood Culture adequate volume   Culture   Final    NO GROWTH 5 DAYS Performed at [facility], [place] [address], [place], [place] [address]    Report Status 03/03/2020 FINAL  Final  Blood culture (routine x 2)     Status: None   Collection Time: 02/27/20  7:50 PM   Specimen: BLOOD LEFT ARM  Result Value Ref Range Status   Specimen Description BLOOD LEFT ARM  Final   Special Requests   Final    BOTTLES DRAWN AEROBIC AND ANAEROBIC Blood Culture adequate volume   Culture   Final    NO GROWTH 5 DAYS Performed at [facility], [place] [address], [place], [place] [address]    Report Status 03/03/2020 FINAL  Final         Radiology Studies: DG Knee 1-2 Views Left  Result Date: 03/02/2020 CLINICAL DATA:  Medial left knee pain EXAM: LEFT KNEE - 1-2 VIEW COMPARISON:  12/16/2019 FINDINGS: Redemonstration of extensive fixation hardware within the distal femur and proximal tibia. The visualized  portions of the hardware appear intact without perihardware lucency or acute fracture. Extensive posttraumatic changes to the femur tibia at and fibula. Severe tricompartmental degenerative changes of the left knee. No new areas of cortical destruction or periostitis evident. Diffuse osseous demineralization. Similar prominence of the soft tissues, which may be postsurgical related to skin grafting. No soft tissue gas. IMPRESSION: No significant change in the appearance of the left knee with chronic posttraumatic, postsurgical, and degenerative changes. Electronically Signed   By: Davina Poke D.O.   On: 03/02/2020 11:59     Scheduled Meds: .  acidophilus  1 capsule Oral Daily  . atorvastatin  80 mg Oral Q M,W,F  . budesonide (PULMICORT) nebulizer solution  0.5 mg Nebulization BID  . finasteride  5 mg Oral Daily  . ipratropium-albuterol  3 mL Nebulization QID  . methylPREDNISolone (SOLU-MEDROL) injection  40 mg Intravenous Q8H  . metoprolol tartrate  37.5 mg Oral BID  . mirabegron ER  50 mg Oral QPM  . sertraline  50 mg Oral Daily  . tamsulosin  0.4 mg Oral QPC supper  . umeclidinium bromide  1 puff Inhalation Daily  . Warfarin - Pharmacist Dosing Inpatient   Does not apply q1800   Continuous Infusions:    LOS: 5 days   Time spent= 35 mins    Simmone Cape Arsenio Loader, MD [facility]  If 7PM-7AM, please contact night-coverage  03/04/2020, 10:25 AM

## 2020-03-04 NOTE — Progress Notes (Signed)
Patient received in mild respiratory distress, coarse breath sounds, expiratory wheezing, ,c/o of SOB, evaluated by RT , respiratory treatment given with poor results, Dr. Silas [place] notified, stat dose of lasix 40 mg IVP given with solumedrol, with good result. Patient encouraged to deep breath and cough, incentive spirometer when awake, head of bed at 45 degrees, chest physiotherapy.
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
## 2020-03-04 NOTE — Progress Notes (Signed)
ANTICOAGULATION CONSULT NOTE  Pharmacy Consult:  Coumadin  Indication: hx VTE  Allergies  Allergen Reactions  . Ativan [Lorazepam] Anxiety and Other (See Comments)    Hyper  [name]    Vital Signs: Temp: 98 F (36.7 C) (03/07 0813) Temp Source: Oral (03/07 0813) BP: 131/87 (03/07 0813) Pulse Rate: 84 (03/07 0813)  Labs: Recent Labs    03/02/20 0223 03/02/20 0223 03/03/20 0217 03/04/20 0128  HGB 15.8   < > 15.2 16.4  HCT 48.8  --  47.9 51.0  PLT 316  --  319 336  LABPROT 29.8*  --  35.3* 34.1*  INR 2.8*  --  3.5* 3.4*  CREATININE 0.73  --  0.70 0.92   < > = values in this interval not displayed.    Estimated Creatinine Clearance: 82.2 mL/min (by C-G formula based on SCr of 0.92 mg/dL).   Assessment: 65 yom on chronic Coumadin (history of PE)  presented to the ED with cough and diarrhea.  Pharmacy consulted to continue Coumadin from PTA.   -INR 3.4  PTA regimen: 2mg  TTSa and 3mg  MWFSu  Goal of Therapy:  INR 2-3 Monitor platelets by anticoagulation protocol: Yes   Plan:  Hold coumadin today Daily INR  Hildred Laser, PharmD Clinical Pharmacist **Pharmacist phone directory can now be found on [email or web address] ([email or web address]).  Listed under [place].

## 2020-03-05 ENCOUNTER — Telehealth: Payer: Self-pay | Admitting: Pulmonary Disease

## 2020-03-05 ENCOUNTER — Inpatient Hospital Stay (HOSPITAL_COMMUNITY): Payer: BC Managed Care – PPO

## 2020-03-05 DIAGNOSIS — Z86711 Personal history of pulmonary embolism: Secondary | ICD-10-CM

## 2020-03-05 DIAGNOSIS — J189 Pneumonia, unspecified organism: Principal | ICD-10-CM

## 2020-03-05 LAB — CBC
HCT: 51 % (ref 39.0–52.0)
Hemoglobin: 16.2 g/dL (ref 13.0–17.0)
MCH: 27.5 pg (ref 26.0–34.0)
MCHC: 31.8 g/dL (ref 30.0–36.0)
MCV: 86.6 fL (ref 80.0–100.0)
Platelets: 350 10*3/uL (ref 150–400)
RBC: 5.89 MIL/uL — ABNORMAL HIGH (ref 4.22–5.81)
RDW: 17.2 % — ABNORMAL HIGH (ref 11.5–15.5)
WBC: 18.6 10*3/uL — ABNORMAL HIGH (ref 4.0–10.5)
nRBC: 0.1 % (ref 0.0–0.2)

## 2020-03-05 LAB — BASIC METABOLIC PANEL
Anion gap: 11 (ref 5–15)
BUN: 36 mg/dL — ABNORMAL HIGH (ref 8–23)
CO2: 33 mmol/L — ABNORMAL HIGH (ref 22–32)
Calcium: 9.2 mg/dL (ref 8.9–10.3)
Chloride: 96 mmol/L — ABNORMAL LOW (ref 98–111)
Creatinine, Ser: 1.07 mg/dL (ref 0.61–1.24)
GFR calc Af Amer: >60 mL/min (ref >60)
GFR calc non Af Amer: >60 mL/min (ref >60)
Glucose, Bld: 135 mg/dL — ABNORMAL HIGH (ref 70–99)
Potassium: 5 mmol/L (ref 3.5–5.1)
Sodium: 140 mmol/L (ref 135–145)

## 2020-03-05 LAB — PROTIME-INR
INR: 2.4 — ABNORMAL HIGH (ref 0.8–1.2)
Prothrombin Time: 26.3 seconds — ABNORMAL HIGH (ref 11.4–15.2)

## 2020-03-05 LAB — MAGNESIUM: Magnesium: 2.2 mg/dL (ref 1.7–2.4)

## 2020-03-05 MED ORDER — IPRATROPIUM-ALBUTEROL 0.5-2.5 (3) MG/3ML IN SOLN
3.0000 mL | Freq: Three times a day (TID) | RESPIRATORY_TRACT | Status: DC
  Administered 2020-03-05 – 2020-03-07 (×5): 3 mL via RESPIRATORY_TRACT
  Filled 2020-03-05 (×5): qty 3

## 2020-03-05 MED ORDER — WARFARIN SODIUM 1 MG PO TABS
1.0000 mg | ORAL_TABLET | Freq: Once | ORAL | Status: AC
  Administered 2020-03-05: 1 mg via ORAL
  Filled 2020-03-05: qty 1

## 2020-03-05 MED ORDER — HALOPERIDOL LACTATE 5 MG/ML IJ SOLN
1.0000 mg | Freq: Once | INTRAMUSCULAR | Status: AC
  Administered 2020-03-05: 1 mg via INTRAVENOUS
  Filled 2020-03-05: qty 1

## 2020-03-05 NOTE — Progress Notes (Signed)
Patient is alert and oriented to person.  Yelling out saying, "I want some pizza."  Unable to calm patient.  RT unable to put CPAP on patient.  Paged MD to see if we can get something to help relax patient.

## 2020-03-05 NOTE — Progress Notes (Signed)
Patient is now calm.  Home PPV in use.  Patient tolerating therapy well.

## 2020-03-05 NOTE — Progress Notes (Signed)
Patient received in bed  Alert , able to follow simple directions, Patient with intermittent wheezing, no complaint of SOB, patient again refused to use BIPAP, but O2  Sat remains 92-96%. Patient made comfortable s/p PM care, condom Catheter in place draining clear amber urine.VSS-afebrile, no significant changes at present.

## 2020-03-05 NOTE — Progress Notes (Signed)
  Speech Language Pathology Treatment: Dysphagia  Patient Details Name: Travis Cruz MRN: [id number] DOB: 01-26-1946 Today's Date: 03/05/2020 Time: 3662-9476 SLP Time Calculation (min) (ACUTE ONLY): 15 min  Assessment / Plan / Recommendation Clinical Impression  Session focused on pt's deglutition with regular texture/thin liquids. Therapist is concerned for potential aspiration and majority may not be sensed. He did have coughing episode at end of session. Given COPD, current pneumonia and  cognitive status an MBS is recommended and scheduled today at 1330. Pt may continue po's until that time.    HPI HPI: 74 yo male admitted to ED on 3/1 with persistent coughing, diarrhea, acute respiratory failure with hypoxia. CXR reveals multifocal pulmonary opacities, some of which may be chronic,although superimposed pneumonia is suspected. PMH includes dementia, anemia, chronic T6 and T10 compression deformities, ARDS, AICD, CAD with history of MI and cardiac cath, COPD emphysema type, DVT, HTN, cardiomyopathy, lung cancer, OSA, CVA. Unwitnessed fall 3/4, no acute findings on imaging.      SLP Plan  MBS       Recommendations  Diet recommendations: Regular;Thin liquid Liquids provided via: Cup Medication Administration: Whole meds with puree Supervision: Patient able to self feed;Intermittent supervision to cue for compensatory strategies Compensations: Slow rate;Small sips/bites Postural Changes and/or Swallow Maneuvers: Seated upright 90 degrees                Oral Care Recommendations: Oral care BID Follow up Recommendations: 24 hour supervision/assistance SLP Visit Diagnosis: Dysphagia, unspecified (R13.10) Plan: MBS       GO                Houston Siren 03/05/2020, 10:12 AM  Orbie Pyo Colvin Caroli.Ed Risk analyst [phone] Office [phone] \

## 2020-03-05 NOTE — Telephone Encounter (Signed)
PCCM:  Please schedule hospital follow-up.  Sometime next week with office based app.  Former patient of Dr. Melvyn Novas from several years ago.  Needs to be reestablished with the office.  [facility] 03/05/2020 3:13 PM
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
## 2020-03-05 NOTE — TOC Progression Note (Addendum)
Transition of Care [facility]) - Progression Note    Patient Details  Name: Travis Cruz MRN: [id number] Date of Birth: 06/29/1946  Transition of Care [facility]) CM/SW Contact  Zenon Mayo, RN Phone Number: 03/05/2020, 4:59 PM  Clinical Narrative:    NCM left wife a message to return call, she call and states her choices are  [facility], [facility].   Chris with [facility] said they would be glad to take patient back if he wants to come back.    3/9  NCM received call from [facility] stating [facility] , she states her top two facilities would be [facility] in [place]. Patient is not ready for dc yet, per MD he will have MBS today and will have to figure at diet needed for patient, ? If aspirating.   Expected Discharge Plan: [place] Barriers to Discharge: Continued Medical Work up  Expected Discharge Plan and Services Expected Discharge Plan: [place] arrangements for the past 2 months: Single Family Home                                       Social Determinants of Health (SDOH) Interventions    Readmission Risk Interventions No flowsheet data found.

## 2020-03-05 NOTE — Progress Notes (Signed)
SLP Cancellation Note  Patient Details Name: Travis Cruz MRN: [id number] DOB: [birth year]   Cancelled treatment:        MBS scheduled for 1330 and at 1400 learned that pt's bed was malfunctioning and bed would not roll. MBS being deferred until 3/9. If he exhibits overt s/s aspiration tonight, RN can initiate nectar thick liquids or make NPO. Will attempt to schedule MBS for earlier tomorrow am rather than later.    [place] Siren 03/05/2020, 4:20 PM    Orbie Pyo Colvin Caroli.Ed Risk analyst [phone] Office [phone]

## 2020-03-05 NOTE — Progress Notes (Signed)
ANTICOAGULATION CONSULT NOTE  Pharmacy Consult:  Coumadin  Indication: hx VTE  Allergies  Allergen Reactions  . Ativan [Lorazepam] Anxiety and Other (See Comments)    Hyper  [name]    Vital Signs: Temp: 97.6 F (36.4 C) (03/08 0751) Temp Source: Oral (03/08 0751) BP: 120/75 (03/08 0751) Pulse Rate: 83 (03/08 0751)  Labs: Recent Labs    03/03/20 0217 03/03/20 0217 03/04/20 0128 03/05/20 0343 03/05/20 0507  HGB 15.2   < > 16.4 16.2  --   HCT 47.9  --  51.0 51.0  --   PLT 319  --  336 350  --   LABPROT 35.3*  --  34.1*  --  26.3*  INR 3.5*  --  3.4*  --  2.4*  CREATININE 0.70  --  0.92 1.07  --    < > = values in this interval not displayed.    Estimated Creatinine Clearance: 70.7 mL/min (by C-G formula based on SCr of 1.07 mg/dL).   Assessment: 41 yom on chronic Coumadin (history of PE)  presented to the ED with cough and diarrhea.  Pharmacy consulted to continue Coumadin from PTA.   -INR now 2.4 - has not received warfarin for last 2 days  PTA regimen: 2mg  TTSa and 3mg  MWFSu  Goal of Therapy:  INR 2-3 Monitor platelets by anticoagulation protocol: Yes   Plan:  Warfarin 1 mg x 1 Daily INR  Barth Kirks, PharmD, BCPS, BCCCP Clinical Pharmacist [phone]  Please check AMION for all [place] numbers  03/05/2020 10:29 AM

## 2020-03-05 NOTE — Progress Notes (Signed)
PROGRESS NOTE    Travis Cruz  JGG:[id number] DOB: 05-15-1946 DOA: 02/27/2020 PCP: Dettinger, Fransisca Kaufmann, MD   Brief Narrative:  74 year old with history of CAD, pulmonary embolism on Coumadin, COPD, vascular dementia, CHF presented to the hospital with productive cough for the last several days and diarrhea.  Upon admission chest x-ray showed multifocal infiltrate on the right side which was confirmed on the CT of the chest.  Empirically started on antibiotics.  Had a fall overnight, CT of the head is negative.  Superficial skin tearing of the left upper extremity, left knee swelling.   Assessment & Plan:   Principal Problem:   Acute respiratory failure with hypoxia (HCC) Active Problems:   COPD (chronic obstructive pulmonary disease) (HCC)   History of pulmonary embolus (PE)   History of CVA (cerebrovascular accident)   Chronic diastolic CHF (congestive heart failure) ([place])   Hypertension   Primary cancer of right upper lobe of lung ([place])   Diarrhea   CAP (community acquired pneumonia)   Pneumonia  Acute hypoxic respiratory failure secondary to COPD Acute on chronic COPD exacerbation, moderate Continues to be dyspneic but slightly improved from yesterday.  Requiring 5 L nasal cannula.  Continue aggressive bronchodilator therapy, IV Solu-Medrol, incentive spirometer, flutter valve. Advised RN to give him IS and Flutter. Aggressive pulmonary toilet. -CT chest is negative for PE but shows advanced emphysema -Supplemental oxygen and supportive care. -Procalcitonin negative.  BNP-76. -Urine strep-negative.  Chest x-ray shows stable patchy bilateral multifocal opacities -Azithromycin day 2 of 5.  Episode of fall Left knee swelling, improving -CT head negative. No focal neuro deficits. -Some left upper extremity bleeding/superficial skin tear. Dressing in place -Left knee x-ray-negative. -Fall precaution, bed alarm.  If necessary will place sitter especially at night. -Uric Acid=  3.7  Acute on chronic chronic hypoxia on 2 L nasal cannula -Currently on 5 L nasal cannula  Diarrhea -Resolved, C. difficile toxin negative, positive antigen, likely colonized.  Chronic diastolic congestive heart failure, EF greater than 65% -BNP normal. But due to shortness of breath, given Lasix 40 mg once IV 3/6  History of pulmonary embolism -On Coumadin  Coronary artery disease -Without chest pain  DVT prophylaxis: Coumadin Code Status: Full code Family Communication: His wife has been updated again today Disposition Plan:   Patient From= home  Patient Anticipated D/C place= PT recommending SNF  Barriers= dyspnea slowly improving still requiring supplemental oxygen.  Maintain hospital stay for at least next 48 hours.   Subjective: Breathing is slightly improved from yesterday.  Not as much coughing but still dyspneic with minimal movement. No other acute events overnight.  Review of Systems Otherwise negative except as per HPI, including: General = no fevers, chills, dizziness,  fatigue HEENT/EYES = negative for loss of vision, double vision, blurred vision,  sore throa Cardiovascular= negative for chest pain, palpitation Respiratory/lungs= negative for shortness of breath, cough, wheezing; hemoptysis,  Gastrointestinal= negative for nausea, vomiting, abdominal pain Genitourinary= negative for Dysuria MSK = Negative for arthralgia, myalgias Neurology= Negative for headache, numbness, tingling  Psychiatry= Negative for suicidal and homocidal ideation Skin= Negative for Rash   Examination: Constitutional: Not in acute distress, 5 L nasal cannula Respiratory: Bilateral diffuse rhonchi Cardiovascular: Normal sinus rhythm, no rubs Abdomen: Nontender nondistended good bowel sounds Musculoskeletal: No edema noted Skin: Left upper extremity dressing noted Neurologic: CN 2-12 grossly intact.  And nonfocal Psychiatric: Normal judgment and insight. Alert and oriented x  3. Normal mood.    Objective: Vitals:   03/05/20  0300 03/05/20 0751 03/05/20 0840 03/05/20 1147  BP:  120/75    Pulse:  83    Resp:  19    Temp:  97.6 F (36.4 C)    TempSrc:  Oral    SpO2:  91% 93% 94%  Weight: 91.2 kg       Intake/Output Summary (Last 24 hours) at 03/05/2020 1207 Last data filed at 03/05/2020 0600 Gross per 24 hour  Intake --  Output 1050 ml  Net -1050 ml   Filed Weights   03/04/20 0405 03/05/20 0300  Weight: 91.2 kg 91.2 kg     Data Reviewed:   CBC: Recent Labs  Lab 03/01/20 0204 03/02/20 0223 03/03/20 0217 03/04/20 0128 03/05/20 0343  WBC 9.5 20.0* 21.1* 20.0* 18.6*  HGB 15.6 15.8 15.2 16.4 16.2  HCT 47.8 48.8 47.9 51.0 51.0  MCV 87.4 86.4 88.4 86.0 86.6  PLT 288 316 319 336 326   Basic Metabolic Panel: Recent Labs  Lab 03/01/20 0204 03/02/20 0223 03/03/20 0217 03/04/20 0128 03/05/20 0343  NA 140 142 138 140 140  K 4.8 4.7 4.4 4.2 5.0  CL 104 105 101 96* 96*  CO2 26 26 28  33* 33*  GLUCOSE 135* 140* 131* 135* 135*  BUN 15 17 24* 28* 36*  CREATININE 0.77 0.73 0.70 0.92 1.07  CALCIUM 9.3 9.6 9.1 9.5 9.2  MG 1.9 2.1 2.0 2.1 2.2   GFR: Estimated Creatinine Clearance: 70.7 mL/min (by C-G formula based on SCr of 1.07 mg/dL). Liver Function Tests: No results for input(s): AST, ALT, ALKPHOS, BILITOT, PROT, ALBUMIN in the last 168 hours. No results for input(s): LIPASE, AMYLASE in the last 168 hours. No results for input(s): AMMONIA in the last 168 hours. Coagulation Profile: Recent Labs  Lab 03/01/20 0204 03/02/20 0223 03/03/20 0217 03/04/20 0128 03/05/20 0507  INR 3.2* 2.8* 3.5* 3.4* 2.4*   Cardiac Enzymes: No results for input(s): CKTOTAL, CKMB, CKMBINDEX, TROPONINI in the last 168 hours. BNP (last 3 results) No results for input(s): PROBNP in the last 8760 hours. HbA1C: No results for input(s): HGBA1C in the last 72 hours. CBG: No results for input(s): GLUCAP in the last 168 hours. Lipid Profile: No results for  input(s): CHOL, HDL, LDLCALC, TRIG, CHOLHDL, LDLDIRECT in the last 72 hours. Thyroid Function Tests: No results for input(s): TSH, T4TOTAL, FREET4, T3FREE, THYROIDAB in the last 72 hours. Anemia Panel: No results for input(s): VITAMINB12, FOLATE, FERRITIN, TIBC, IRON, RETICCTPCT in the last 72 hours. Sepsis Labs: Recent Labs  Lab 02/29/20 0838  PROCALCITON <0.10    Recent Results (from the past 240 hour(s))  SARS CORONAVIRUS 2 (TAT 6-24 HRS) Nasopharyngeal Nasopharyngeal Swab     Status: None   Collection Time: 02/27/20  6:48 PM   Specimen: Nasopharyngeal Swab  Result Value Ref Range Status   SARS Coronavirus 2 NEGATIVE NEGATIVE Final    Comment: (NOTE) SARS-CoV-2 target nucleic acids are NOT DETECTED. The SARS-CoV-2 RNA is generally detectable in upper and lower respiratory specimens during the acute phase of infection. Negative results do not preclude SARS-CoV-2 infection, do not rule out co-infections with other pathogens, and should not be used as the sole basis for treatment or other patient management decisions. Negative results must be combined with clinical observations, patient history, and epidemiological information. The expected result is Negative. Fact Sheet for Patients: SugarRoll.be Fact Sheet for Healthcare Providers: https://www.woods-mathews.com/ This test is not yet approved or cleared by the [place] FDA and  has been authorized for detection and/or  diagnosis of SARS-CoV-2 by FDA under an Emergency Use Authorization (EUA). This EUA will remain  in effect (meaning this test can be used) for the duration of the COVID-19 declaration under Section 56 4(b)(1) of the Act, 21 U.S.C. section 360bbb-3(b)(1), unless the authorization is terminated or revoked sooner. Performed at [facility], [place] [address] [place], [place] [address]   C Difficile Quick Screen w PCR reflex     Status: Abnormal   Collection Time:  02/27/20  6:48 PM  Result Value Ref Range Status   C Diff antigen POSITIVE (A) NEGATIVE Final   C Diff toxin NEGATIVE NEGATIVE Final   C Diff interpretation Results are indeterminate. See PCR results.  Final    Comment: Performed at [facility], [place] [address], [place], [place] [address]  C. Diff by PCR, Reflexed     Status: None   Collection Time: 02/27/20  6:48 PM  Result Value Ref Range Status   Toxigenic C. Difficile by PCR NEGATIVE NEGATIVE Final    Comment: Patient is colonized with non toxigenic C. difficile. May not need treatment unless significant symptoms are present. Performed at [facility], [place] [address], [place], [place] [address]   Blood culture (routine x 2)     Status: None   Collection Time: 02/27/20  6:49 PM   Specimen: BLOOD  Result Value Ref Range Status   Specimen Description BLOOD BLOOD RIGHT FOREARM  Final   Special Requests   Final    BOTTLES DRAWN AEROBIC AND ANAEROBIC Blood Culture adequate volume   Culture   Final    NO GROWTH 5 DAYS Performed at [facility], [place] [address] [place], [place] [address]    Report Status 03/03/2020 FINAL  Final  Blood culture (routine x 2)     Status: None   Collection Time: 02/27/20  7:50 PM   Specimen: BLOOD LEFT ARM  Result Value Ref Range Status   Specimen Description BLOOD LEFT ARM  Final   Special Requests   Final    BOTTLES DRAWN AEROBIC AND ANAEROBIC Blood Culture adequate volume   Culture   Final    NO GROWTH 5 DAYS Performed at [facility], [place] [address], [place], [place] [address]    Report Status 03/03/2020 FINAL  Final         Radiology Studies: No results found.   Scheduled Meds: . acidophilus  1 capsule Oral Daily  . arformoterol  15 mcg Nebulization BID  . atorvastatin  80 mg Oral Q M,W,F  . azithromycin  500 mg Oral Daily  . budesonide (PULMICORT) nebulizer solution  0.5 mg Nebulization BID  . finasteride  5 mg Oral Daily  . ipratropium-albuterol  3 mL  Nebulization TID  . methylPREDNISolone (SOLU-MEDROL) injection  40 mg Intravenous Q8H  . metoprolol tartrate  37.5 mg Oral BID  . mirabegron ER  50 mg Oral QPM  . sertraline  50 mg Oral Daily  . tamsulosin  0.4 mg Oral QPC supper  . umeclidinium bromide  1 puff Inhalation Daily  . warfarin  1 mg Oral ONCE-1800  . Warfarin - Pharmacist Dosing Inpatient   Does not apply q1800   Continuous Infusions:    LOS: 6 days   Time spent= 35 mins    Theran Vandergrift Arsenio Loader, MD [facility]  If 7PM-7AM, please contact night-coverage  03/05/2020, 12:07 PM

## 2020-03-05 NOTE — Telephone Encounter (Signed)
Patient has been scheduled for a hospital follow up on 03/12/20 at 11am.

## 2020-03-06 ENCOUNTER — Inpatient Hospital Stay (HOSPITAL_COMMUNITY): Payer: BC Managed Care – PPO

## 2020-03-06 ENCOUNTER — Telehealth: Payer: Self-pay | Admitting: *Deleted

## 2020-03-06 LAB — PROTIME-INR
INR: 1.7 — ABNORMAL HIGH (ref 0.8–1.2)
Prothrombin Time: 20.3 seconds — ABNORMAL HIGH (ref 11.4–15.2)

## 2020-03-06 MED ORDER — WARFARIN SODIUM 1 MG PO TABS
1.0000 mg | ORAL_TABLET | Freq: Once | ORAL | Status: AC
  Administered 2020-03-06: 1 mg via ORAL
  Filled 2020-03-06: qty 1

## 2020-03-06 MED ORDER — HALOPERIDOL LACTATE 5 MG/ML IJ SOLN
1.0000 mg | Freq: Once | INTRAMUSCULAR | Status: AC
  Administered 2020-03-06: 1 mg via INTRAVENOUS
  Filled 2020-03-06: qty 1

## 2020-03-06 MED ORDER — RESOURCE THICKENUP CLEAR PO POWD
ORAL | Status: DC | PRN
  Filled 2020-03-06: qty 125

## 2020-03-06 NOTE — Progress Notes (Signed)
Physical Therapy Treatment Patient Details Name: Travis Cruz MRN: [id number] DOB: 08/07/1946 Today's Date: 03/06/2020    History of Present Illness 74 yo male admitted to ED on 3/1 with persistent coughing, diarrhea, acute respiratory failure with hypoxia. CXR reveals multifocal pulmonary opacities, some of which may be chronic,although superimposed pneumonia is suspected. PMH includes dementia, anemia, chronic T6 and T10 compression deformities, ARDS, AICD, CAD with history of MI and cardiac cath, COPD emphysema type, DVT, HTN, cardiomyopathy, lung cancer, OSA, CVA. Unwitnessed fall 3/4, no acute findings on imaging.    PT Comments    Pt found with RN assisting getting pt back into bed. Pt is on 4L of O2; SpO2 remains with 93-96% throughout. Pt has difficulty sequencing ambulation specifically when turning and backing up. He uses a RW to ambulate 7' with min to mod A x 2 for safety. Pt initially requires min A to ambulate however he frequently gets stuck and when cued to reverse he will take steps forward. Amble time cuing pt to perform backing up towards bed. Pt tends to wheeze and takes short, shallow breaths. Pt is cued verbally and tactilely to perform diaphragmatic (belly) breathing and perform pursed lip breathing. Pt unable to understand and follow commands. Limited time today for therapy due to pt going to a swallow study. D/C plans remain appropriate. PT will continue to follow acutely.   Follow Up Recommendations  SNF;Supervision/Assistance - 24 hour     Equipment Recommendations  None recommended by PT       Precautions / Restrictions Precautions Precautions: Fall;ICD/Pacemaker Precaution Comments: dementia, cog impairments Restrictions Weight Bearing Restrictions: No    Mobility  Bed Mobility Overal bed mobility: Needs Assistance Bed Mobility: Sit to Supine       Sit to supine: Min assist;HOB elevated   General bed mobility comments: Min A sit>supine for cuing/  sequencing, light assist. Pt received in recliner.  Transfers Overall transfer level: Needs assistance Equipment used: Rolling walker (2 wheeled) Transfers: Sit to/from Stand Sit to Stand: Min assist         General transfer comment: Min assist for power up, steadying. Verbal cuing for safe hand placement.   Ambulation/Gait Ambulation/Gait assistance: Min assist;Mod assist;+2 safety/equipment Gait Distance (Feet): 7 Feet Assistive device: Rolling walker (2 wheeled) Gait Pattern/deviations: Decreased stride length;Narrow base of support;Decreased step length - left;Decreased step length - right;Shuffle;Step-through pattern Gait velocity: decr Gait velocity interpretation: <1.8 ft/sec, indicate of risk for recurrent falls General Gait Details: Min A for steadying and guiding in right direction, mod A when turning around and backing up to bed. Difficult time following directions and sequencing backwards step motion.          Balance Overall balance assessment: Needs assistance Sitting-balance support: No upper extremity supported;Feet supported Sitting balance-Leahy Scale: Fair     Standing balance support: Bilateral upper extremity supported;During functional activity Standing balance-Leahy Scale: Poor Standing balance comment: reliant on external support in standing                            Cognition Arousal/Alertness: Awake/alert Behavior During Therapy: Flat affect Overall Cognitive Status: History of cognitive impairments - at baseline                                 General Comments: History of dementia at baseline, A&O x4. Pt follow increased processing time noted with difficulty sequencing.  Exercises Other Exercises Other Exercises: pursed lip breathing    General Comments General comments (skin integrity, edema, etc.): Pt on 4L supp O2 via [place]. Pt's SpO2 after functional mobility is 93-94%.      Pertinent Vitals/Pain Pain  Assessment: No/denies pain           PT Goals (current goals can now be found in the care plan section) Acute Rehab PT Goals Patient Stated Goal: none stated PT Goal Formulation: With patient Time For Goal Achievement: 03/13/20 Potential to Achieve Goals: Good Progress towards PT goals: Progressing toward goals    Frequency    Min 2X/week      PT Plan Current plan remains appropriate       AM-PAC PT "6 Clicks" Mobility   Outcome Measure  Help needed turning from your back to your side while in a flat bed without using bedrails?: A Little Help needed moving from lying on your back to sitting on the side of a flat bed without using bedrails?: A Little Help needed moving to and from a bed to a chair (including a wheelchair)?: A Little Help needed standing up from a chair using your arms (e.g., wheelchair or bedside chair)?: A Little Help needed to walk in hospital room?: A Lot Help needed climbing 3-5 steps with a railing? : Total 6 Click Score: 15    End of Session Equipment Utilized During Treatment: Gait belt;Oxygen Activity Tolerance: Patient tolerated treatment well;Patient limited by fatigue Patient left: in bed;with call bell/phone within reach;with nursing/sitter in room(left with transport for swallow study) Nurse Communication: Mobility status PT Visit Diagnosis: Other abnormalities of gait and mobility (R26.89);Muscle weakness (generalized) (M62.81);Difficulty in walking, not elsewhere classified (R26.2)     Time: 8938-1017 PT Time Calculation (min) (ACUTE ONLY): 17 min  Charges:  $Therapeutic Activity: 8-22 mins                     Jodelle Green, PT, DPT Acute Rehabilitation Services Office [phone]   Jodelle Green 03/06/2020, 2:39 PM

## 2020-03-06 NOTE — Progress Notes (Signed)
Home CPAP in use

## 2020-03-06 NOTE — Progress Notes (Signed)
  Speech Language Pathology   Patient Details Name: Travis Cruz MRN: [id number] DOB: 1946-05-10 Today's Date: 03/06/2020 Time:  -      MBS completed. Report to be in chart once fully documented. Recommend Dys 3 texture, nectar thick liquids, pills whole in puree.                 Houston Siren 03/06/2020, 3:17 PM  Orbie Pyo Colvin Caroli.Ed Risk analyst [phone] Office [phone]

## 2020-03-06 NOTE — Plan of Care (Signed)

## 2020-03-06 NOTE — Progress Notes (Signed)
PROGRESS NOTE    Travis Cruz  YNW:[id number] DOB: 1946/10/07 DOA: 02/27/2020 PCP: Dettinger, Fransisca Kaufmann, MD   Brief Narrative:  74 year old with history of CAD, pulmonary embolism on Coumadin, COPD, vascular dementia, CHF presented to the hospital with productive cough for the last several days and diarrhea.  Upon admission chest x-ray showed multifocal infiltrate on the right side which was confirmed on the CT of the chest.  Empirically started on antibiotics.  Had a fall overnight, CT of the head is negative.  Superficial skin tearing of the left upper extremity, left knee swelling.   Assessment & Plan:   Principal Problem:   Acute respiratory failure with hypoxia (HCC) Active Problems:   COPD (chronic obstructive pulmonary disease) (HCC)   History of pulmonary embolus (PE)   History of CVA (cerebrovascular accident)   Chronic diastolic CHF (congestive heart failure) ([place])   Hypertension   Primary cancer of right upper lobe of lung ([place])   Diarrhea   CAP (community acquired pneumonia)   Pneumonia  Acute hypoxic respiratory failure secondary to COPD Acute on chronic COPD exacerbation, moderate Continues to be dyspneic but slightly improved from yesterday.  Requiring 5 L nasal cannula.  Continue aggressive bronchodilator therapy, IV Solu-Medrol, incentive spirometer, flutter valve.  I-S/flutter aggressive pulmonary toilet. -CT chest is negative for PE but shows advanced emphysema -Supplemental oxygen and supportive care. -Procalcitonin negative.  BNP-76. -Urine strep-negative.  Chest x-ray shows stable patchy bilateral multifocal opacities -Azithromycin day 3 of 5.  Episode of fall Left knee swelling, improved -CT head negative. No focal neuro deficits. -Some left upper extremity bleeding/superficial skin tear. Dressing in place -Left knee x-ray-negative. -Fall precaution, bed alarm.  If necessary will place sitter especially at night. -Uric Acid= 3.7  Acute on chronic  chronic hypoxia on 2 L nasal cannula -Currently anywhere from 3-5 L nasal cannula  Dysphagia -Risk of aspiration.  MBS today afternoon  Diarrhea -Resolved, C. difficile toxin negative, positive antigen, likely colonized.  Chronic diastolic congestive heart failure, EF greater than 65% -BNP normal. But due to shortness of breath, given Lasix 40 mg once IV 3/6  History of pulmonary embolism -On Coumadin  Coronary artery disease -Without chest pain  DVT prophylaxis: Coumadin Code Status: Full code Family Communication:  Disposition Plan:   Patient From= home  Patient Anticipated D/C place= PT recommending SNF  Barriers= very slow to improve still requiring supplemental oxygen with bilateral rhonchi.  No Foley will continue to improve over the next 48 hours.   Subjective: Still dyspneic at rest, little less coughing.  Review of Systems Otherwise negative except as per HPI, including: General = no fevers, chills, dizziness,  fatigue HEENT/EYES = negative for loss of vision, double vision, blurred vision,  sore throa Cardiovascular= negative for chest pain, palpitation Respiratory/lungs= negative for shortness of breath, cough, wheezing; hemoptysis,  Gastrointestinal= negative for nausea, vomiting, abdominal pain Genitourinary= negative for Dysuria MSK = Negative for arthralgia, myalgias Neurology= Negative for headache, numbness, tingling  Psychiatry= Negative for suicidal and homocidal ideation Skin= Negative for Rash    Examination: Constitutional: Not in acute distress, 4 L nasal cannula Respiratory: Bilateral rhonchorous breath sounds Cardiovascular: Normal sinus rhythm, no rubs Abdomen: Nontender nondistended good bowel sounds Musculoskeletal: No edema noted Skin: No rashes seen Neurologic: CN 2-12 grossly intact.  And nonfocal Psychiatric: Normal judgment and insight. Alert and oriented x 3. Normal mood.    Objective: Vitals:   03/06/20 0145 03/06/20 0429  03/06/20 0751 03/06/20 0849  BP:  126/68  140/87   Pulse:  (!) 108 94   Resp:  20 16   Temp:  97.7 F (36.5 C) 97.8 F (36.6 C)   TempSrc:  Axillary Oral   SpO2:  98% 91% 91%  Weight: 90.8 kg       Intake/Output Summary (Last 24 hours) at 03/06/2020 1335 Last data filed at 03/06/2020 1322 Gross per 24 hour  Intake 1440 ml  Output 300 ml  Net 1140 ml   Filed Weights   03/04/20 0405 03/05/20 0300 03/06/20 0145  Weight: 91.2 kg 91.2 kg 90.8 kg     Data Reviewed:   CBC: Recent Labs  Lab 03/01/20 0204 03/02/20 0223 03/03/20 0217 03/04/20 0128 03/05/20 0343  WBC 9.5 20.0* 21.1* 20.0* 18.6*  HGB 15.6 15.8 15.2 16.4 16.2  HCT 47.8 48.8 47.9 51.0 51.0  MCV 87.4 86.4 88.4 86.0 86.6  PLT 288 316 319 336 419   Basic Metabolic Panel: Recent Labs  Lab 03/01/20 0204 03/02/20 0223 03/03/20 0217 03/04/20 0128 03/05/20 0343  NA 140 142 138 140 140  K 4.8 4.7 4.4 4.2 5.0  CL 104 105 101 96* 96*  CO2 26 26 28  33* 33*  GLUCOSE 135* 140* 131* 135* 135*  BUN 15 17 24* 28* 36*  CREATININE 0.77 0.73 0.70 0.92 1.07  CALCIUM 9.3 9.6 9.1 9.5 9.2  MG 1.9 2.1 2.0 2.1 2.2   GFR: Estimated Creatinine Clearance: 69.5 mL/min (by C-G formula based on SCr of 1.07 mg/dL). Liver Function Tests: No results for input(s): AST, ALT, ALKPHOS, BILITOT, PROT, ALBUMIN in the last 168 hours. No results for input(s): LIPASE, AMYLASE in the last 168 hours. No results for input(s): AMMONIA in the last 168 hours. Coagulation Profile: Recent Labs  Lab 03/02/20 0223 03/03/20 0217 03/04/20 0128 03/05/20 0507 03/06/20 0121  INR 2.8* 3.5* 3.4* 2.4* 1.7*   Cardiac Enzymes: No results for input(s): CKTOTAL, CKMB, CKMBINDEX, TROPONINI in the last 168 hours. BNP (last 3 results) No results for input(s): PROBNP in the last 8760 hours. HbA1C: No results for input(s): HGBA1C in the last 72 hours. CBG: No results for input(s): GLUCAP in the last 168 hours. Lipid Profile: No results for input(s): CHOL,  HDL, LDLCALC, TRIG, CHOLHDL, LDLDIRECT in the last 72 hours. Thyroid Function Tests: No results for input(s): TSH, T4TOTAL, FREET4, T3FREE, THYROIDAB in the last 72 hours. Anemia Panel: No results for input(s): VITAMINB12, FOLATE, FERRITIN, TIBC, IRON, RETICCTPCT in the last 72 hours. Sepsis Labs: Recent Labs  Lab 02/29/20 0838  PROCALCITON <0.10    Recent Results (from the past 240 hour(s))  SARS CORONAVIRUS 2 (TAT 6-24 HRS) Nasopharyngeal Nasopharyngeal Swab     Status: None   Collection Time: 02/27/20  6:48 PM   Specimen: Nasopharyngeal Swab  Result Value Ref Range Status   SARS Coronavirus 2 NEGATIVE NEGATIVE Final    Comment: (NOTE) SARS-CoV-2 target nucleic acids are NOT DETECTED. The SARS-CoV-2 RNA is generally detectable in upper and lower respiratory specimens during the acute phase of infection. Negative results do not preclude SARS-CoV-2 infection, do not rule out co-infections with other pathogens, and should not be used as the sole basis for treatment or other patient management decisions. Negative results must be combined with clinical observations, patient history, and epidemiological information. The expected result is Negative. Fact Sheet for Patients: SugarRoll.be Fact Sheet for Healthcare Providers: https://www.woods-mathews.com/ This test is not yet approved or cleared by the [place] FDA and  has been authorized for detection and/or diagnosis of  SARS-CoV-2 by FDA under an Emergency Use Authorization (EUA). This EUA will remain  in effect (meaning this test can be used) for the duration of the COVID-19 declaration under Section 56 4(b)(1) of the Act, 21 U.S.C. section 360bbb-3(b)(1), unless the authorization is terminated or revoked sooner. Performed at [facility], [place] [address] [place], [place] [address]   C Difficile Quick Screen w PCR reflex     Status: Abnormal   Collection Time: 02/27/20  6:48  PM  Result Value Ref Range Status   C Diff antigen POSITIVE (A) NEGATIVE Final   C Diff toxin NEGATIVE NEGATIVE Final   C Diff interpretation Results are indeterminate. See PCR results.  Final    Comment: Performed at [facility], [place] [address] [place], [place] [address]  C. Diff by PCR, Reflexed     Status: None   Collection Time: 02/27/20  6:48 PM  Result Value Ref Range Status   Toxigenic C. Difficile by PCR NEGATIVE NEGATIVE Final    Comment: Patient is colonized with non toxigenic C. difficile. May not need treatment unless significant symptoms are present. Performed at [facility], [place] [address], [place], [place] [address]   Blood culture (routine x 2)     Status: None   Collection Time: 02/27/20  6:49 PM   Specimen: BLOOD  Result Value Ref Range Status   Specimen Description BLOOD BLOOD RIGHT FOREARM  Final   Special Requests   Final    BOTTLES DRAWN AEROBIC AND ANAEROBIC Blood Culture adequate volume   Culture   Final    NO GROWTH 5 DAYS Performed at [facility], [place] [address] [place], [place] [address]    Report Status 03/03/2020 FINAL  Final  Blood culture (routine x 2)     Status: None   Collection Time: 02/27/20  7:50 PM   Specimen: BLOOD LEFT ARM  Result Value Ref Range Status   Specimen Description BLOOD LEFT ARM  Final   Special Requests   Final    BOTTLES DRAWN AEROBIC AND ANAEROBIC Blood Culture adequate volume   Culture   Final    NO GROWTH 5 DAYS Performed at [facility], [place] [address], [place], [place] [address]    Report Status 03/03/2020 FINAL  Final         Radiology Studies: No results found.   Scheduled Meds: . acidophilus  1 capsule Oral Daily  . arformoterol  15 mcg Nebulization BID  . atorvastatin  80 mg Oral Q M,W,F  . azithromycin  500 mg Oral Daily  . budesonide (PULMICORT) nebulizer solution  0.5 mg Nebulization BID  . finasteride  5 mg Oral Daily  . ipratropium-albuterol  3 mL Nebulization TID  .  methylPREDNISolone (SOLU-MEDROL) injection  40 mg Intravenous Q8H  . metoprolol tartrate  37.5 mg Oral BID  . mirabegron ER  50 mg Oral QPM  . sertraline  50 mg Oral Daily  . tamsulosin  0.4 mg Oral QPC supper  . Warfarin - Pharmacist Dosing Inpatient   Does not apply q1800   Continuous Infusions:    LOS: 7 days   Time spent= 35 mins    Deward Sebek Arsenio Loader, MD [facility]  If 7PM-7AM, please contact night-coverage  03/06/2020, 1:35 PM

## 2020-03-06 NOTE — Progress Notes (Signed)
  Speech Language Pathology :    Patient Details Name: Travis Cruz MRN: [id number] DOB: 1946/07/18 Today's Date: 03/06/2020 Time:  -       MBS scheduled today at 1330.    Orbie Pyo [place].Ed Risk analyst [phone] Office [phone]

## 2020-03-06 NOTE — Progress Notes (Signed)
ANTICOAGULATION CONSULT NOTE  Pharmacy Consult: warfarin Indication: hx VTE  Allergies  Allergen Reactions  . Ativan [Lorazepam] Anxiety and Other (See Comments)    Hyper  [name]    Vital Signs: Temp: 97.8 F (36.6 C) (03/09 0751) Temp Source: Oral (03/09 0751) BP: 140/87 (03/09 0751) Pulse Rate: 94 (03/09 0751)  Labs: Recent Labs    03/04/20 0128 03/05/20 0343 03/05/20 0507 03/06/20 0121  HGB 16.4 16.2  --   --   HCT 51.0 51.0  --   --   PLT 336 350  --   --   LABPROT 34.1*  --  26.3* 20.3*  INR 3.4*  --  2.4* 1.7*  CREATININE 0.92 1.07  --   --     Estimated Creatinine Clearance: 69.5 mL/min (by C-G formula based on SCr of 1.07 mg/dL).   Assessment: 24 yom on chronic warfarin (history of PE) presented to the ED with cough and diarrhea. Pharmacy consulted to continue Coumadin from PTA.    INR trended down to 1.7 (likely due to not receiving warfarin for 2 days 3/6-3/7 with supratherapeutic INR). CBC wnl. No active bleed issues reported. Noted DDI with azithromycin, day #3 of 5.  PTA regimen: 2mg  TTSa and 3mg  MWFSu  Goal of Therapy:  INR 2-3 Monitor platelets by anticoagulation protocol: Yes   Plan:  Warfarin 1mg  PO x 1 dose again tonight Monitor daily INR, CBC, s/sx bleeding   Arturo Morton, PharmD, BCPS Please check AMION for all [place] contact numbers Clinical Pharmacist 03/06/2020 1:38 PM

## 2020-03-06 NOTE — Progress Notes (Addendum)
Patient tolerated his home PPV machine overnight

## 2020-03-06 NOTE — Telephone Encounter (Signed)
CALLED PATIENT'S WIFE- [name] TO INFORM OF CT FOR 03-13-20- ARRIVAL TIME- 11:45 AM @ [facility], PT. TO HAVE WATER ONLY - 4 HRS. PRIOR TO TEST, PATIENT TO RECEIVE RESULTS FROM [facility]ING ON 03-14-20 @ 1 PM FOR RESULTS, SPOKE WITH PATIENT'S WIFE - [name] Kress AND SHE IS AWARE OF THESE APPTS.

## 2020-03-07 LAB — PROTIME-INR
INR: 1.5 — ABNORMAL HIGH (ref 0.8–1.2)
Prothrombin Time: 17.9 seconds — ABNORMAL HIGH (ref 11.4–15.2)

## 2020-03-07 LAB — BASIC METABOLIC PANEL
Anion gap: 9 (ref 5–15)
BUN: 38 mg/dL — ABNORMAL HIGH (ref 8–23)
CO2: 30 mmol/L (ref 22–32)
Calcium: 9.2 mg/dL (ref 8.9–10.3)
Chloride: 102 mmol/L (ref 98–111)
Creatinine, Ser: 0.97 mg/dL (ref 0.61–1.24)
GFR calc Af Amer: >60 mL/min (ref >60)
GFR calc non Af Amer: >60 mL/min (ref >60)
Glucose, Bld: 164 mg/dL — ABNORMAL HIGH (ref 70–99)
Potassium: 4.6 mmol/L (ref 3.5–5.1)
Sodium: 141 mmol/L (ref 135–145)

## 2020-03-07 LAB — MAGNESIUM: Magnesium: 2.4 mg/dL (ref 1.7–2.4)

## 2020-03-07 MED ORDER — WARFARIN SODIUM 3 MG PO TABS
3.0000 mg | ORAL_TABLET | Freq: Once | ORAL | Status: AC
  Administered 2020-03-07: 3 mg via ORAL
  Filled 2020-03-07: qty 1

## 2020-03-07 NOTE — TOC Progression Note (Signed)
Transition of Care [facility]) - Progression Note    Patient Details  Name: Asberry Lascola MRN: [id number] Date of Birth: [birth year]  [facility] of [facility]) CM/SW Contact  Zenon Mayo, RN Phone Number: 03/07/2020, 10:51 AM  Clinical Narrative:    Please notify NCM before you plan to dc patient so that covid test can be ordered and auth can be gotten for  Snf.    Expected Discharge Plan: [place] Barriers to Discharge: Continued Medical Work up  Expected Discharge Plan and Services Expected Discharge Plan: [place] arrangements for the past 2 months: Single Family Home                                       Social Determinants of Health (SDOH) Interventions    Readmission Risk Interventions No flowsheet data found.

## 2020-03-07 NOTE — Progress Notes (Signed)
ANTICOAGULATION CONSULT NOTE  Pharmacy Consult: warfarin Indication: hx VTE  Allergies  Allergen Reactions  . Ativan [Lorazepam] Anxiety and Other (See Comments)    Hyper  [name]    Vital Signs: Temp: 98 F (36.7 C) (03/10 0721) BP: 140/98 (03/10 0721) Pulse Rate: 78 (03/10 0721)  Labs: Recent Labs    03/05/20 0343 03/05/20 0507 03/06/20 0121 03/07/20 0238  HGB 16.2  --   --   --   HCT 51.0  --   --   --   PLT 350  --   --   --   LABPROT  --  26.3* 20.3* 17.9*  INR  --  2.4* 1.7* 1.5*  CREATININE 1.07  --   --  0.97    Estimated Creatinine Clearance: 76.6 mL/min (by C-G formula based on SCr of 0.97 mg/dL).   Assessment: 28 yom on chronic warfarin (history of PE) presented to the ED with cough and diarrhea. Pharmacy consulted to continue Coumadin from PTA.    INR trended down to 1.5 (likely due to not receiving warfarin for 2 days 3/6-3/7 with supratherapeutic INR). CBC wnl. No active bleed issues reported. Noted DDI with azithromycin, day #4 of 5.  PTA regimen: 2mg  TTSa and 3mg  MWFSu  Goal of Therapy:  INR 2-3 Monitor platelets by anticoagulation protocol: Yes   Plan:  Warfarin 3mg  PO x 1 dose Monitor daily INR, CBC, s/sx bleeding   Arturo Morton, PharmD, BCPS Please check AMION for all [place] contact numbers Clinical Pharmacist 03/07/2020 12:46 PM

## 2020-03-07 NOTE — Progress Notes (Signed)
Physical Therapy Treatment Patient Details Name: Travis Cruz MRN: [id number] DOB: 08/23/1946 Today's Date: 03/07/2020    History of Present Illness 74 yo male admitted to ED on 3/1 with persistent coughing, diarrhea, acute respiratory failure with hypoxia. CXR reveals multifocal pulmonary opacities, some of which may be chronic,although superimposed pneumonia is suspected. PMH includes dementia, anemia, chronic T6 and T10 compression deformities, ARDS, AICD, CAD with history of MI and cardiac cath, COPD emphysema type, DVT, HTN, cardiomyopathy, lung cancer, OSA, CVA. Unwitnessed fall 3/4, no acute findings on imaging.    PT Comments    Pt is on 4L of O2 via HFNC. Resting SpO2 is 93%. Pt is min guard supine>sit, mod A sit<>stand, and min A to ambulate with RW. Pt relies heavily on B UE support when ambulating and fatigues easily. Pt had improved ambulation distance today. Pt ambulated for 55' with RW and another 4' when transferring into chair. SpO2 drops to 88-91% with mobility however quickly increases to resting level. Pt has difficulty sequencing his feet when turning and especially with backing up. He requires mod A to back up into the chair once fatigued. Increased time due to slow processing. D/C plans remain appropriate. PT will continue to follow pt acutely.   Follow Up Recommendations  SNF;Supervision/Assistance - 24 hour     Equipment Recommendations  None recommended by PT       Precautions / Restrictions Precautions Precautions: Fall;ICD/Pacemaker Precaution Comments: dementia, cog impairments Restrictions Weight Bearing Restrictions: No    Mobility  Bed Mobility Overal bed mobility: Needs Assistance Bed Mobility: Sit to Supine     Supine to sit: Min guard;HOB elevated     General bed mobility comments: Min guard for line management and steadying  Transfers Overall transfer level: Needs assistance Equipment used: Rolling walker (2 wheeled) Transfers: Sit to/from  Stand Sit to Stand: Mod assist         General transfer comment: mod A for initiation, cuing for safe hand placement.  Ambulation/Gait Ambulation/Gait assistance: Min assist;+2 safety/equipment;Mod assist Gait Distance (Feet): 32 Feet Assistive device: Rolling walker (2 wheeled) Gait Pattern/deviations: Decreased stride length;Narrow base of support;Decreased step length - left;Decreased step length - right;Shuffle;Step-through pattern Gait velocity: decr Gait velocity interpretation: 1.31 - 2.62 ft/sec, indicative of limited community ambulator General Gait Details: min A for steading and RW placement. Mod A when turning and backing up to chair once pt is fatigued.       Balance Overall balance assessment: Needs assistance Sitting-balance support: No upper extremity supported;Feet supported Sitting balance-Leahy Scale: Fair Sitting balance - Comments: Sat EOB without PT support   Standing balance support: Bilateral upper extremity supported;During functional activity Standing balance-Leahy Scale: Poor Standing balance comment: reliant on external support in standing                            Cognition Arousal/Alertness: Awake/alert Behavior During Therapy: Flat affect Overall Cognitive Status: History of cognitive impairments - at baseline                                 General Comments: History of dementia at baseline, A&O x4. Pt with increased processing time noted with difficulty sequencing.      Exercises Other Exercises Other Exercises: pursed lip breathing    General Comments General comments (skin integrity, edema, etc.): Pt is on 4L of O2 via HFNC. Resting SpO2 is  at 93%. During activity pt slightly desats to 88-91%. SpO2 quickly rises upon resting.       Pertinent Vitals/Pain Pain Assessment: Faces Faces Pain Scale: No hurt           PT Goals (current goals can now be found in the care plan section) Acute Rehab PT  Goals Patient Stated Goal: none stated PT Goal Formulation: With patient Time For Goal Achievement: 03/13/20 Potential to Achieve Goals: Good Progress towards PT goals: Progressing toward goals    Frequency    Min 2X/week      PT Plan Current plan remains appropriate       AM-PAC PT "6 Clicks" Mobility   Outcome Measure  Help needed turning from your back to your side while in a flat bed without using bedrails?: A Little Help needed moving from lying on your back to sitting on the side of a flat bed without using bedrails?: A Little Help needed moving to and from a bed to a chair (including a wheelchair)?: A Lot Help needed standing up from a chair using your arms (e.g., wheelchair or bedside chair)?: A Lot Help needed to walk in hospital room?: A Little Help needed climbing 3-5 steps with a railing? : Total 6 Click Score: 14    End of Session Equipment Utilized During Treatment: Gait belt;Oxygen Activity Tolerance: Patient tolerated treatment well;Patient limited by fatigue Patient left: in chair;with call bell/phone within reach;with chair alarm set;with nursing/sitter in room Nurse Communication: Mobility status PT Visit Diagnosis: Other abnormalities of gait and mobility (R26.89);Muscle weakness (generalized) (M62.81);Difficulty in walking, not elsewhere classified (R26.2)     Time: 9937-1696 PT Time Calculation (min) (ACUTE ONLY): 32 min  Charges:  $Gait Training: 8-22 mins $Therapeutic Activity: 8-22 mins                    Jodelle Green, PT, DPT Acute Rehabilitation Services Office [phone]   Jodelle Green 03/07/2020, 4:22 PM

## 2020-03-07 NOTE — Progress Notes (Signed)
Modified Barium Swallow Progress Note  Patient Details  Name: Travis Cruz MRN: [id number] Date of Birth: 06/17/1946  Today's Date: 03/07/2020  Modified Barium Swallow completed.  Full report located under Chart Review in the Imaging Section.  Brief recommendations include the following:  Clinical Impression  Pt exhibited mild oropharyngeal dysphagia with prolonged mastication and transit and penetration and aspiration of thin barium. Pt's epiglottic deflection was incomplete periodically and absent on one trial resulting in penetration near vocal cords and aspiration with thin barium without sensation. Reduced tongue base retraction led to mild vallecular and pyriform sinus residue post swallows with thin and nectar. Nectar thick briefly penetrated vestibule on one trial. Given pt's current cognitive status and clinical presentation, recommend Dys 3, nectar thick, pills whole in puree. ST will follow.     Swallow Evaluation Recommendations       SLP Diet Recommendations: Dysphagia 3 (Mech soft) solids;Nectar thick liquid   Liquid Administration via: Cup;No straw   Medication Administration: Whole meds with puree   Supervision: Patient able to self feed;Intermittent supervision to cue for compensatory strategies   Compensations: Slow rate;Small sips/bites   Postural Changes: Seated upright at 90 degrees   Oral Care Recommendations: Oral care BID        Houston Siren 03/07/2020,10:12 AM   Orbie Pyo Colvin Caroli.Ed Risk analyst [phone] Office [phone]
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
## 2020-03-08 ENCOUNTER — Ambulatory Visit: Payer: Medicare Other | Admitting: Family Medicine

## 2020-03-08 LAB — MAGNESIUM: Magnesium: 2.3 mg/dL (ref 1.7–2.4)

## 2020-03-08 LAB — BASIC METABOLIC PANEL
Anion gap: 10 (ref 5–15)
BUN: 36 mg/dL — ABNORMAL HIGH (ref 8–23)
CO2: 27 mmol/L (ref 22–32)
Calcium: 9.2 mg/dL (ref 8.9–10.3)
Chloride: 102 mmol/L (ref 98–111)
Creatinine, Ser: 0.88 mg/dL (ref 0.61–1.24)
GFR calc Af Amer: >60 mL/min (ref >60)
GFR calc non Af Amer: >60 mL/min (ref >60)
Glucose, Bld: 126 mg/dL — ABNORMAL HIGH (ref 70–99)
Potassium: 4.8 mmol/L (ref 3.5–5.1)
Sodium: 139 mmol/L (ref 135–145)

## 2020-03-08 LAB — SARS CORONAVIRUS 2 (TAT 6-24 HRS): SARS Coronavirus 2: NEGATIVE

## 2020-03-08 LAB — PROTIME-INR
INR: 1.9 — ABNORMAL HIGH (ref 0.8–1.2)
Prothrombin Time: 21.5 seconds — ABNORMAL HIGH (ref 11.4–15.2)

## 2020-03-08 MED ORDER — METHYLPREDNISOLONE SODIUM SUCC 40 MG IJ SOLR
40.0000 mg | Freq: Every day | INTRAMUSCULAR | Status: DC
  Administered 2020-03-09: 40 mg via INTRAVENOUS
  Filled 2020-03-08: qty 1

## 2020-03-08 MED ORDER — WARFARIN SODIUM 2 MG PO TABS
2.0000 mg | ORAL_TABLET | Freq: Once | ORAL | Status: AC
  Administered 2020-03-08: 2 mg via ORAL
  Filled 2020-03-08: qty 1

## 2020-03-08 NOTE — TOC Progression Note (Signed)
Transition of Care [facility]) - Progression Note    Patient Details  Name: Travis Cruz MRN: [id number] Date of Birth: 1946-01-02  [facility] of [facility]) CM/SW Contact  Zenon Mayo, RN Phone Number: 03/08/2020, 12:33 PM  Clinical Narrative:    NCM spoke with wife she states she is ok with patient going to [facility] in [place],  [place] spoke with Hinton Dyer at [facility] in [place] she states they have auth which they received yesterday for 14 days.  She will have a bed for this patient tomorrow.  MD plans to dc tomorrow if he continues to improve.  MD is ordering Covid test today.       Expected Discharge Plan: [place] Barriers to Discharge: Continued Medical Work up  Expected Discharge Plan and Services Expected Discharge Plan: [place] arrangements for the past 2 months: Single Family Home                                       Social Determinants of Health (SDOH) Interventions    Readmission Risk Interventions No flowsheet data found.

## 2020-03-08 NOTE — Progress Notes (Signed)
  Speech Language Pathology Treatment: Dysphagia  Patient Details Name: Travis Cruz MRN: [id number] DOB: 1946-09-29 Today's Date: 03/08/2020 Time: 9449-6759 SLP Time Calculation (min) (ACUTE ONLY): 11 min  Assessment / Plan / Recommendation Clinical Impression  Pt seen with part of lunch meal tray for dysphagia treatment. He could not correctly state the need for thickened liquids and therapist provided clinical rationale which he does not appear cognitively able to fully appreciate. He states the roast was not difficult for him to [facility] and demonstrated this without residue and propelled in timely manner. No s/s observed with nectar thick liquids. Discharge plans per chart documentation is [facility] tomorrow. Pt should continue Dys 3 (mechanical soft) texture, nectar thick liquids, pills whole in puree, NO straws and ST at facility to work with pt on upgrade when able and continue therapy.    HPI HPI: 74 yo male admitted to ED on 3/1 with persistent coughing, diarrhea, acute respiratory failure with hypoxia. CXR reveals multifocal pulmonary opacities, some of which may be chronic,although superimposed pneumonia is suspected. PMH includes dementia, anemia, chronic T6 and T10 compression deformities, ARDS, AICD, CAD with history of MI and cardiac cath, COPD emphysema type, DVT, HTN, cardiomyopathy, lung cancer, OSA, CVA. Unwitnessed fall 3/4, no acute findings on imaging.      SLP Plan  Continue with current plan of care       Recommendations  Diet recommendations: Dysphagia 3 (mechanical soft);Nectar-thick liquid Liquids provided via: Cup;No straw Medication Administration: Whole meds with puree Supervision: Patient able to self feed Compensations: Slow rate;Small sips/bites Postural Changes and/or Swallow Maneuvers: Seated upright 90 degrees                Oral Care Recommendations: Oral care BID Follow up Recommendations: 24 hour supervision/assistance SLP Visit  Diagnosis: Dysphagia, unspecified (R13.10) Plan: Continue with current plan of care       GO                Houston Siren 03/08/2020, 3:14 PM  Orbie Pyo Colvin Caroli.Ed Risk analyst [phone] Office [phone]

## 2020-03-08 NOTE — Progress Notes (Signed)
ANTICOAGULATION CONSULT NOTE  Pharmacy Consult: warfarin Indication: hx VTE  Allergies  Allergen Reactions  . Ativan [Lorazepam] Anxiety and Other (See Comments)    Hyper  [name]    Vital Signs: Temp: 97.6 F (36.4 C) (03/11 0756) Temp Source: Oral (03/11 0756) BP: 142/89 (03/11 0756) Pulse Rate: 79 (03/11 0756)  Labs: Recent Labs    03/06/20 0121 03/07/20 0238 03/08/20 0218  LABPROT 20.3* 17.9* 21.5*  INR 1.7* 1.5* 1.9*  CREATININE  --  0.97 0.88    Estimated Creatinine Clearance: 84.5 mL/min (by C-G formula based on SCr of 0.88 mg/dL).   Assessment: 76 yom on chronic warfarin (history of PE) presented to the ED with cough and diarrhea. Pharmacy consulted to continue Coumadin from PTA.    INR going back up to 1.9. CBC wnl. No active bleed issues reported. Noted DDI with azithromycin, day #5 of 5.  PTA regimen: 2mg  TTSa and 3mg  MWFSu  Goal of Therapy:  INR 2-3 Monitor platelets by anticoagulation protocol: Yes   Plan:  Warfarin 2 mg PO x 1 dose Monitor daily INR, CBC, s/sx bleeding   Alanda Slim, PharmD, [facility] Clinical Pharmacist Please see AMION for all Pharmacists' Contact Phone Numbers 03/08/2020, 10:31 AM
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
## 2020-03-09 DIAGNOSIS — I5033 Acute on chronic diastolic (congestive) heart failure: Secondary | ICD-10-CM | POA: Diagnosis not present

## 2020-03-09 DIAGNOSIS — E785 Hyperlipidemia, unspecified: Secondary | ICD-10-CM | POA: Diagnosis not present

## 2020-03-09 DIAGNOSIS — R197 Diarrhea, unspecified: Secondary | ICD-10-CM | POA: Diagnosis not present

## 2020-03-09 DIAGNOSIS — G4733 Obstructive sleep apnea (adult) (pediatric): Secondary | ICD-10-CM | POA: Diagnosis not present

## 2020-03-09 DIAGNOSIS — R1310 Dysphagia, unspecified: Secondary | ICD-10-CM | POA: Diagnosis not present

## 2020-03-09 DIAGNOSIS — J439 Emphysema, unspecified: Secondary | ICD-10-CM | POA: Diagnosis not present

## 2020-03-09 DIAGNOSIS — J9601 Acute respiratory failure with hypoxia: Secondary | ICD-10-CM | POA: Diagnosis not present

## 2020-03-09 DIAGNOSIS — F339 Major depressive disorder, recurrent, unspecified: Secondary | ICD-10-CM | POA: Diagnosis not present

## 2020-03-09 DIAGNOSIS — R1313 Dysphagia, pharyngeal phase: Secondary | ICD-10-CM | POA: Diagnosis not present

## 2020-03-09 DIAGNOSIS — R41841 Cognitive communication deficit: Secondary | ICD-10-CM | POA: Diagnosis not present

## 2020-03-09 DIAGNOSIS — M6281 Muscle weakness (generalized): Secondary | ICD-10-CM | POA: Diagnosis not present

## 2020-03-09 DIAGNOSIS — F0150 Vascular dementia without behavioral disturbance: Secondary | ICD-10-CM | POA: Diagnosis not present

## 2020-03-09 DIAGNOSIS — I8291 Chronic embolism and thrombosis of unspecified vein: Secondary | ICD-10-CM | POA: Diagnosis not present

## 2020-03-09 DIAGNOSIS — I2510 Atherosclerotic heart disease of native coronary artery without angina pectoris: Secondary | ICD-10-CM | POA: Diagnosis not present

## 2020-03-09 DIAGNOSIS — I5032 Chronic diastolic (congestive) heart failure: Secondary | ICD-10-CM | POA: Diagnosis not present

## 2020-03-09 DIAGNOSIS — J441 Chronic obstructive pulmonary disease with (acute) exacerbation: Secondary | ICD-10-CM | POA: Diagnosis not present

## 2020-03-09 DIAGNOSIS — I4891 Unspecified atrial fibrillation: Secondary | ICD-10-CM | POA: Diagnosis not present

## 2020-03-09 DIAGNOSIS — F0390 Unspecified dementia without behavioral disturbance: Secondary | ICD-10-CM | POA: Diagnosis not present

## 2020-03-09 DIAGNOSIS — J9621 Acute and chronic respiratory failure with hypoxia: Secondary | ICD-10-CM | POA: Diagnosis not present

## 2020-03-09 DIAGNOSIS — R262 Difficulty in walking, not elsewhere classified: Secondary | ICD-10-CM | POA: Diagnosis not present

## 2020-03-09 DIAGNOSIS — R791 Abnormal coagulation profile: Secondary | ICD-10-CM | POA: Diagnosis not present

## 2020-03-09 DIAGNOSIS — J189 Pneumonia, unspecified organism: Secondary | ICD-10-CM | POA: Diagnosis not present

## 2020-03-09 DIAGNOSIS — J449 Chronic obstructive pulmonary disease, unspecified: Secondary | ICD-10-CM | POA: Diagnosis not present

## 2020-03-09 LAB — BASIC METABOLIC PANEL
Anion gap: 12 (ref 5–15)
BUN: 32 mg/dL — ABNORMAL HIGH (ref 8–23)
CO2: 24 mmol/L (ref 22–32)
Calcium: 8.8 mg/dL — ABNORMAL LOW (ref 8.9–10.3)
Chloride: 102 mmol/L (ref 98–111)
Creatinine, Ser: 0.72 mg/dL (ref 0.61–1.24)
GFR calc Af Amer: >60 mL/min (ref >60)
GFR calc non Af Amer: >60 mL/min (ref >60)
Glucose, Bld: 93 mg/dL (ref 70–99)
Potassium: 4.7 mmol/L (ref 3.5–5.1)
Sodium: 138 mmol/L (ref 135–145)

## 2020-03-09 LAB — PROTIME-INR
INR: 3 — ABNORMAL HIGH (ref 0.8–1.2)
Prothrombin Time: 31 seconds — ABNORMAL HIGH (ref 11.4–15.2)

## 2020-03-09 LAB — MAGNESIUM: Magnesium: 2.1 mg/dL (ref 1.7–2.4)

## 2020-03-09 IMAGING — DX DG CHEST 1V PORT
1 series · 1 of 1 positions shown · non-contrast
Comparison: 02/27/2020

CLINICAL DATA: Dyspnea

EXAM:
PORTABLE CHEST 1 VIEW

[chest ap]
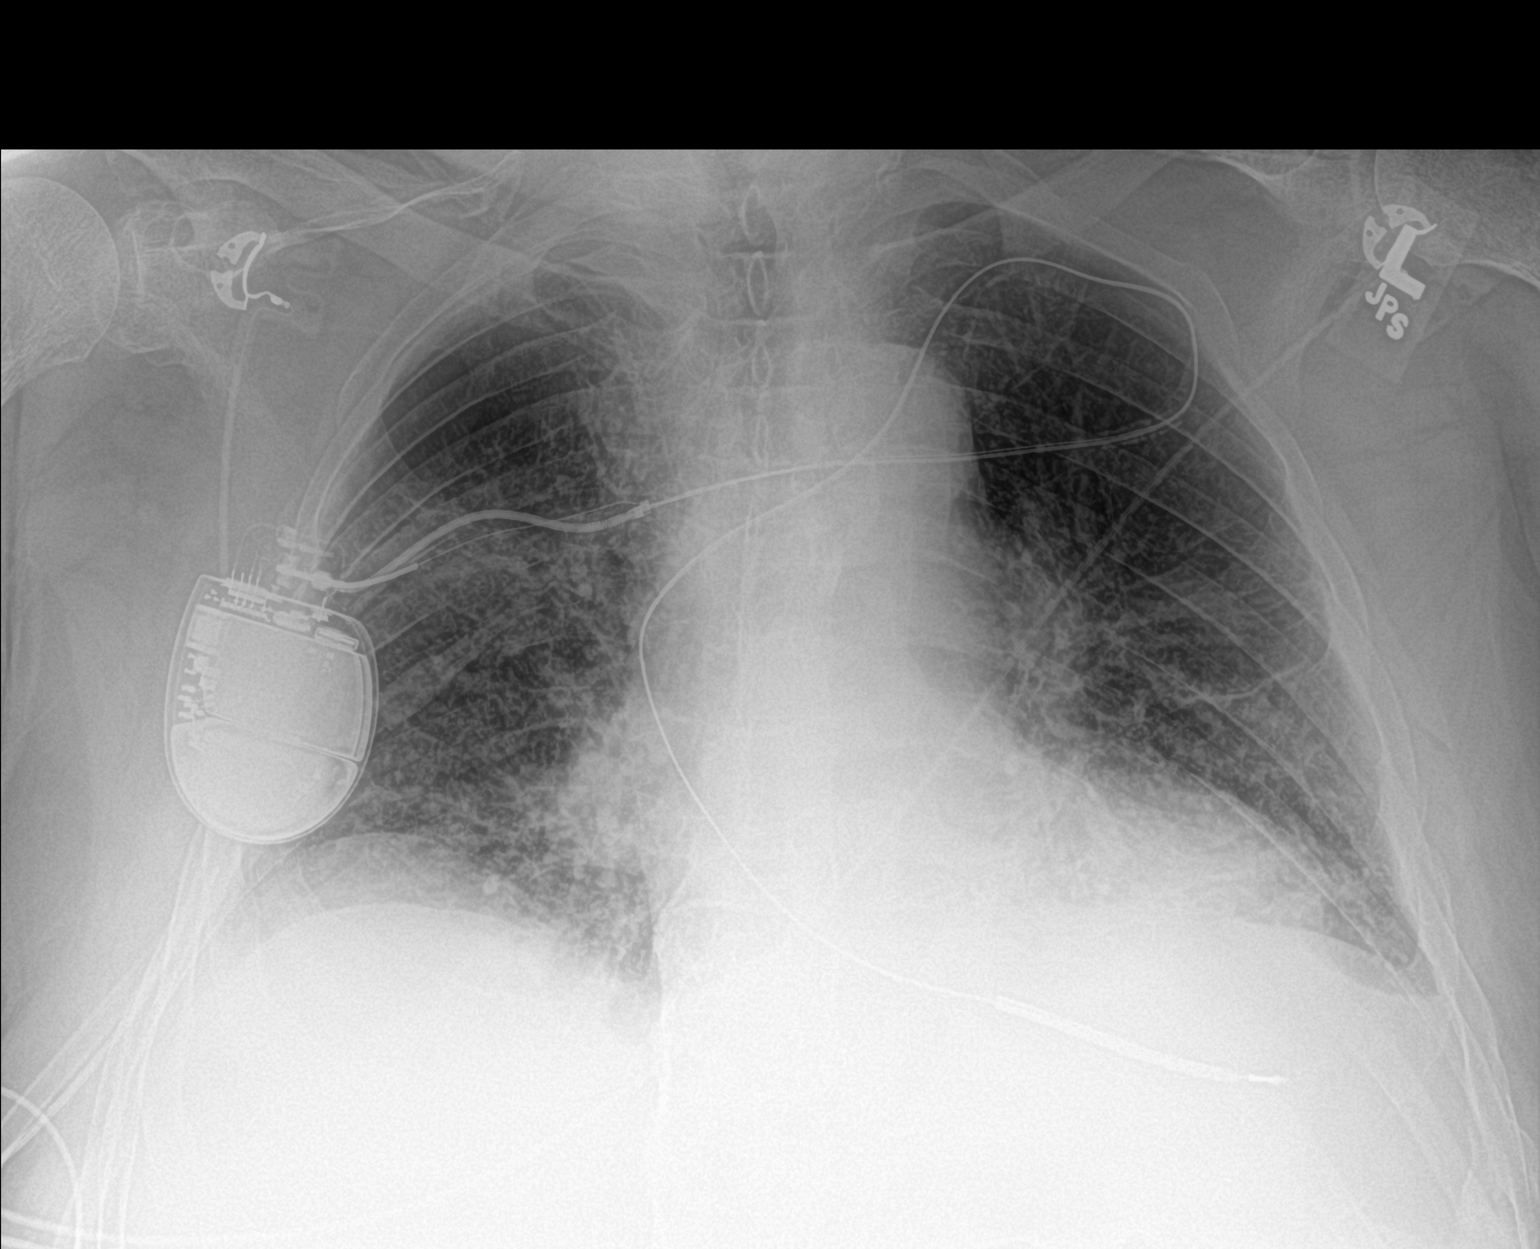

[1 of 1 positions shown; findings below may reference images not displayed]

FINDINGS: Stable positioning of right-sided implanted cardiac device. Stable
mild cardiomegaly. Patchy multifocal airspace opacities most
pronounced within the inferior aspect of the right upper lobe and
the bilateral lung bases. No appreciable interval change from prior.
No pleural effusion or pneumothorax.
IMPRESSION: Stable patchy multifocal airspace opacities without significant
interval change.

## 2020-03-09 IMAGING — CT CT HEAD W/O CM
2 of 3 series · 15 of 37 positions shown, 18 images · non-contrast
Comparison: 02/27/2020

CLINICAL DATA: Unwitnessed fall

EXAM:
CT HEAD WITHOUT CONTRAST
TECHNIQUE: Contiguous axial images were obtained from the base of the skull
through the vertex without intravenous contrast.

[Series 4: head 2.0 h70h · axial · 0.40mm/px · z∈[-105,+45]mm · 12 of 85 slices shown, 15 images]
[im 5/85  brain]
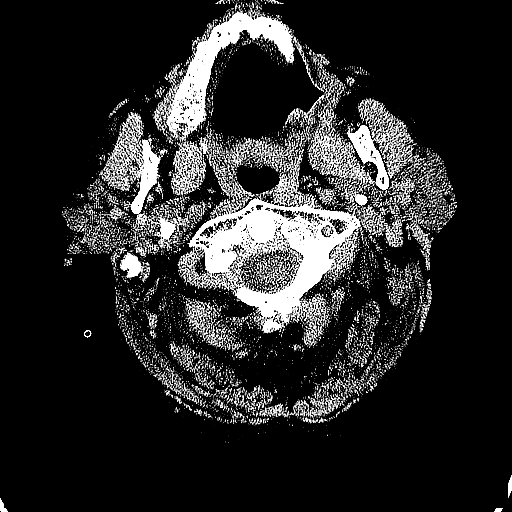
[im 5/85  bone]
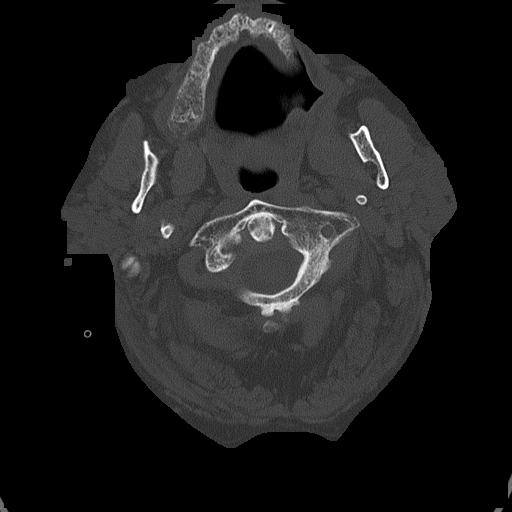
[im 13/85  brain]
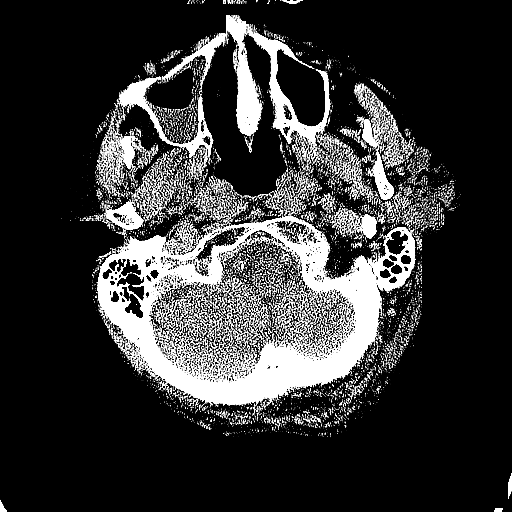
[im 17/85  brain]
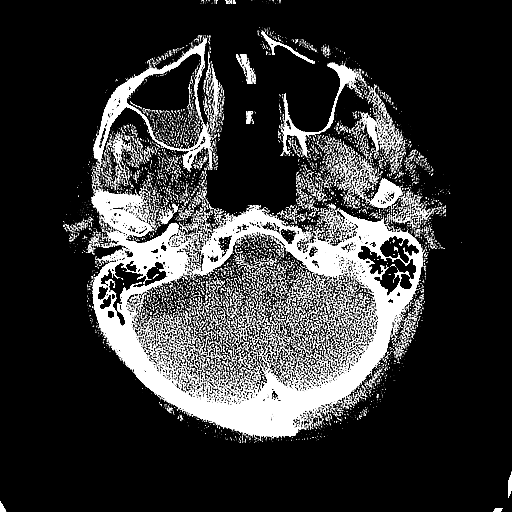
[im 26/85  brain]
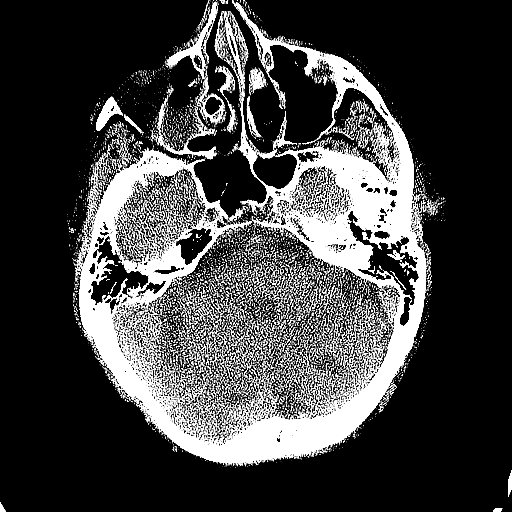
[im 34/85  brain]
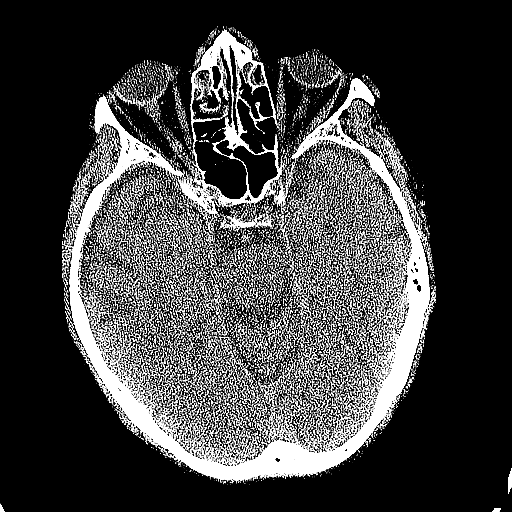
[im 34/85  bone]
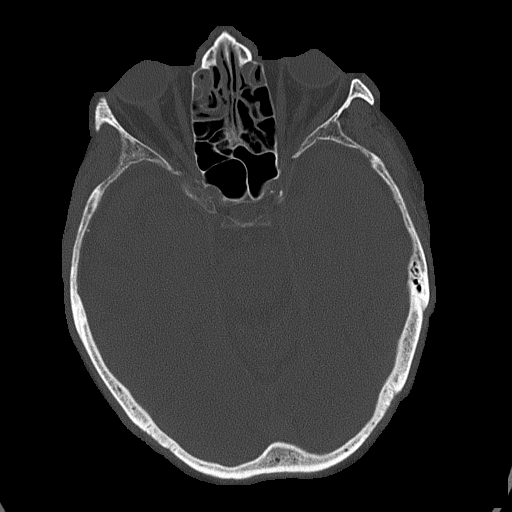
[im 38/85  brain]
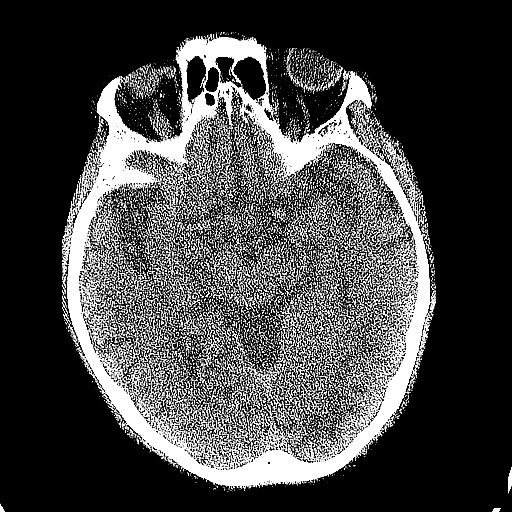
[im 47/85  brain]
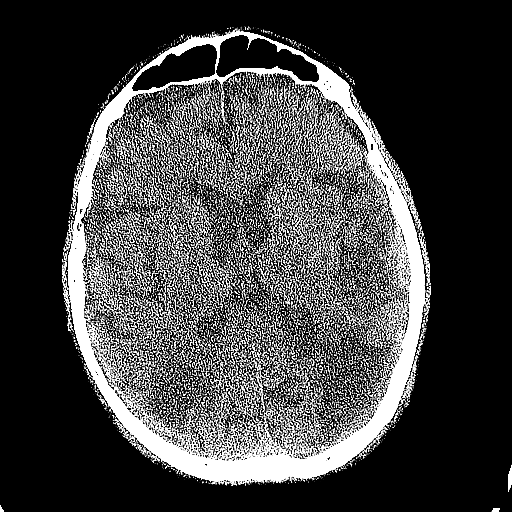
[im 51/85  brain]
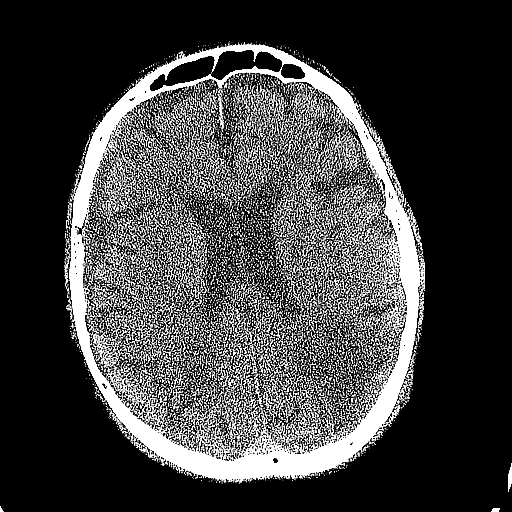
[im 59/85  brain]
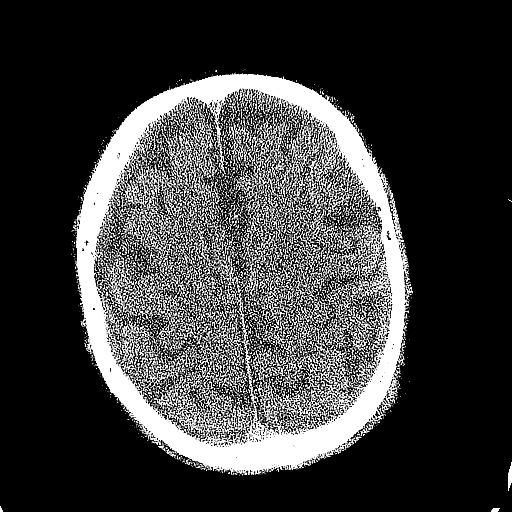
[im 59/85  bone]
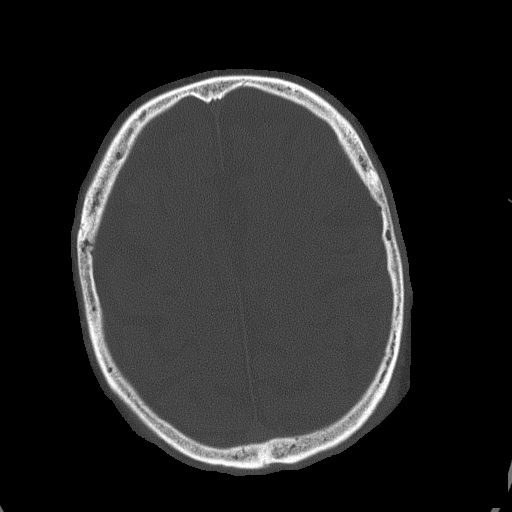
[im 68/85  brain]
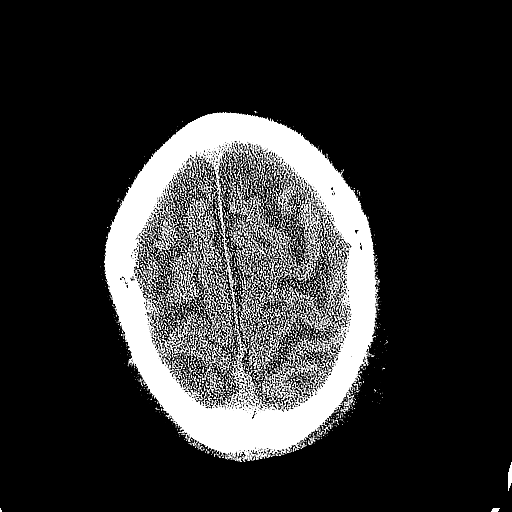
[im 72/85  brain]
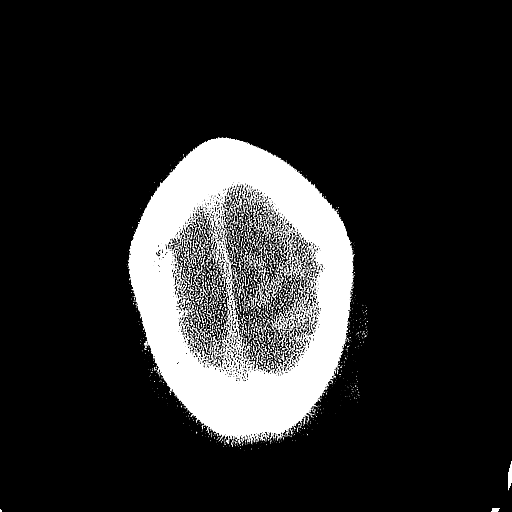
[im 80/85  brain]
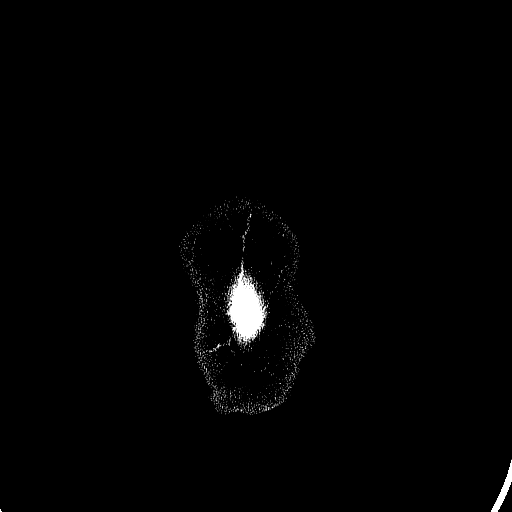

[Series 6: head 3.0 mpr sag · sagittal · 0.33mm/px · 3 of 55 slices shown]
[im 19/55  brain]
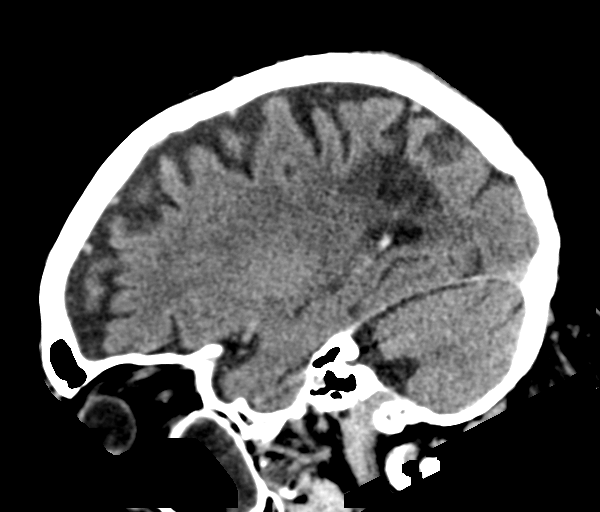
[im 28/55  brain]
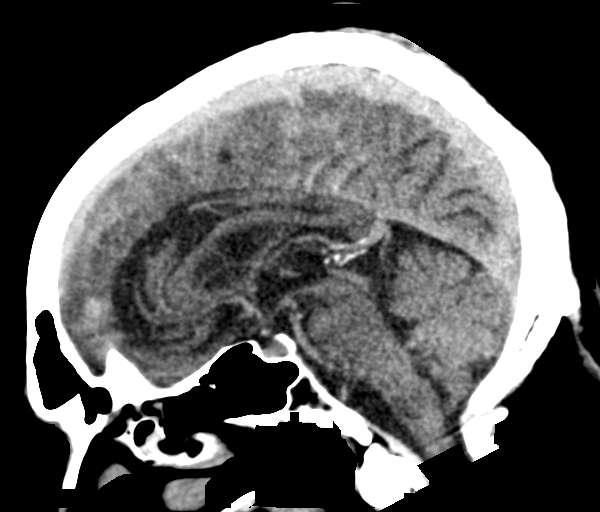
[im 37/55  brain]
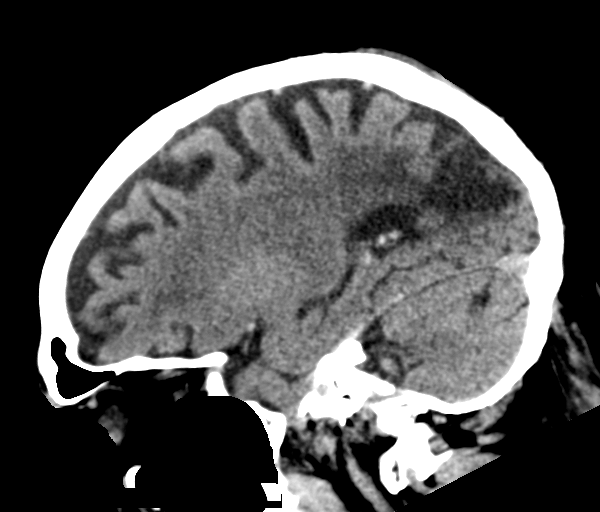

[15 of 37 positions shown; findings below may reference images not displayed]

FINDINGS: Brain: No evidence of acute infarction, hemorrhage, hydrocephalus,
extra-axial collection or mass lesion/mass effect. Bilateral
parietooccipital encephalomalacia, left greater than right.
Additional small left cerebellar remote infarct. Scattered
low-density changes within the periventricular and subcortical white
matter compatible with chronic microvascular ischemic change.
Moderate diffuse cerebral volume loss.

Vascular: Atherosclerotic calcifications involving the large vessels
of the skull base. No unexpected hyperdense vessel.

Skull: Negative for calvarial fracture.

Sinuses/Orbits: Mucoperiosteal thickening within the bilateral
ethmoid air cells and right maxillary sinus. Progressive
opacification of the right maxillary sinus with air-fluid level.
Orbital structures are intact.

Other: None.
IMPRESSION: 1.  No acute intracranial findings.

2.  Acute on chronic right maxillary sinusitis.

3. Chronic microvascular ischemic change and cerebral volume loss,
stable from prior.

## 2020-03-09 MED ORDER — DM-GUAIFENESIN ER 30-600 MG PO TB12: 1.0000 | ORAL_TABLET | Freq: Two times a day (BID) | ORAL | Status: DC | PRN

## 2020-03-09 MED ORDER — PREDNISONE 10 MG PO TABS: ORAL_TABLET | ORAL | 0 refills | Status: AC

## 2020-03-09 MED ORDER — BUDESONIDE 0.5 MG/2ML IN SUSP: 0.5000 mg | Freq: Two times a day (BID) | RESPIRATORY_TRACT | 0 refills | Status: DC

## 2020-03-09 MED ORDER — ARFORMOTEROL TARTRATE 15 MCG/2ML IN NEBU: 15.0000 ug | INHALATION_SOLUTION | Freq: Two times a day (BID) | RESPIRATORY_TRACT | Status: DC

## 2020-03-09 MED ORDER — ALBUTEROL SULFATE (2.5 MG/3ML) 0.083% IN NEBU: 2.5000 mg | INHALATION_SOLUTION | RESPIRATORY_TRACT | 12 refills | Status: DC | PRN

## 2020-03-09 MED ORDER — AZITHROMYCIN 250 MG PO TABS: 250.0000 mg | ORAL_TABLET | ORAL | 0 refills | Status: AC

## 2020-03-09 MED ORDER — IPRATROPIUM-ALBUTEROL 0.5-2.5 (3) MG/3ML IN SOLN: 3.0000 mL | RESPIRATORY_TRACT | Status: DC | PRN

## 2020-03-09 NOTE — TOC Transition Note (Signed)
Transition of Care [facility]) - CM/SW Discharge Note   Patient Details  Name: Kruz Chiu MRN: [id number] Date of Birth: Jul 04, 1946  [facility] CM/SW Contact:  Zenon Mayo, RN Phone Number: 03/09/2020, 11:59 AM   Clinical Narrative:    Hinton Dyer with [facility] states she can take patient today, she has authorization.  NCM notiifed Mrs Kassim and Patient about the transfer today via [place].  NCM notified Firefighter RN  To call report to [phone] and ask for 500 hall nurse , he is going to room 517-2.  NCM scheduled transport with [facility].    Final next level of care: Skilled Nursing Facility Barriers to Discharge: No Barriers Identified   Patient Goals and CMS Choice Patient states their goals for this hospitalization and ongoing recovery are:: SNF CMS Medicare.gov Compare Post Acute Care list provided to:: Patient Represenative (must comment)(wife) Choice offered to / list presented to : Spouse  Discharge Placement              Patient chooses bed at: [facility] Patient to be transferred to facility by: [facility] Name of family member notified: Mrs Chery Patient and family notified of of transfer: 03/09/20  Discharge Plan and Services                DME Arranged: (NA)         HH Arranged: NA          Social Determinants of Health (SDOH) Interventions     Readmission Risk Interventions No flowsheet data found.

## 2020-03-09 NOTE — Discharge Summary (Signed)
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 during swallow;Reduced epiglottic inversion;Pharyngeal residue - pyriform;Pharyngeal residue - valleculae Pharyngeal Material enters airway, remains ABOVE vocal cords and not ejected out;Material enters airway, passes BELOW cords without attempt by patient to eject out (silent aspiration) Pharyngeal- Thin Straw Pharyngeal residue - valleculae;Pharyngeal residue - pyriform;Penetration/Aspiration during swallow Pharyngeal Material enters airway, CONTACTS cords and not ejected out Pharyngeal- Puree -- Pharyngeal -- Pharyngeal- Mechanical Soft -- Pharyngeal -- Pharyngeal- Regular WFL Pharyngeal -- Pharyngeal- Multi-consistency -- Pharyngeal -- Pharyngeal- Pill -- Pharyngeal -- Pharyngeal Comment --  CHL IP CERVICAL ESOPHAGEAL PHASE 03/07/2020 Cervical Esophageal Phase WFL Pudding Teaspoon -- Pudding Cup -- Honey Teaspoon -- Honey Cup -- Nectar Teaspoon -- Nectar Cup -- Nectar Straw -- Thin Teaspoon -- Thin Cup -- Thin Straw -- Puree -- Mechanical Soft -- Regular -- Multi-consistency -- Pill -- Cervical Esophageal Comment -- [place] Siren 03/07/2020, 10:12 AM                 The results of significant diagnostics from this hospitalization  (including imaging, microbiology, ancillary and laboratory) are listed below for reference.     Microbiology: Recent Results (from the past 240 hour(s))  SARS CORONAVIRUS 2 (TAT 6-24 HRS) Nasopharyngeal Nasopharyngeal Swab     Status: None   Collection Time: 03/08/20  2:18 PM   Specimen: Nasopharyngeal Swab  Result Value Ref Range Status   SARS Coronavirus 2 NEGATIVE NEGATIVE Final    Comment: (NOTE) SARS-CoV-2 target nucleic acids are NOT DETECTED. The SARS-CoV-2 RNA is generally detectable in upper and lower respiratory specimens during the acute phase of infection. Negative results do not preclude SARS-CoV-2 infection, do not rule out co-infections with other pathogens, and should not be used as the sole basis for treatment or other patient management decisions. Negative results must be combined with clinical observations, patient history, and epidemiological information. The expected result is Negative. Fact Sheet for Patients: SugarRoll.be Fact Sheet for Healthcare Providers: https://www.woods-mathews.com/ This test is not yet approved or cleared by the [place] FDA and  has been authorized for detection and/or diagnosis of SARS-CoV-2  by FDA under an Emergency Use Authorization (EUA). This EUA will remain  in effect (meaning this test can be used) for the duration of the COVID-19 declaration under Section 56 4(b)(1) of the Act, 21 U.S.C. section 360bbb-3(b)(1), unless the authorization is terminated or revoked sooner. Performed at [facility], [place] [address], [place], [place] [address]      Labs: BNP (last 3 results) Recent Labs    01/17/20 1503 02/29/20 0838  BNP 84.0 43.1   Basic Metabolic Panel: Recent Labs  Lab 03/04/20 0128 03/05/20 0343 03/07/20 0238 03/08/20 0218 03/09/20 0241  NA 140 140 141 139 138  K 4.2 5.0 4.6 4.8 4.7  CL 96* 96* 102 102 102  CO2 33* 33* 30 27 24   GLUCOSE 135* 135* 164* 126*  93  BUN 28* 36* 38* 36* 32*  CREATININE 0.92 1.07 0.97 0.88 0.72  CALCIUM 9.5 9.2 9.2 9.2 8.8*  MG 2.1 2.2 2.4 2.3 2.1   Liver Function Tests: No results for input(s): AST, ALT, ALKPHOS, BILITOT, PROT, ALBUMIN in the last 168 hours. No results for input(s): LIPASE, AMYLASE in the last 168 hours. No results for input(s): AMMONIA in the last 168 hours. CBC: Recent Labs  Lab 03/03/20 0217 03/04/20 0128 03/05/20 0343  WBC 21.1* 20.0* 18.6*  HGB 15.2 16.4 16.2  HCT 47.9 51.0 51.0  MCV 88.4 86.0 86.6  PLT 319 336 350   Cardiac Enzymes: No results for input(s): CKTOTAL, CKMB, CKMBINDEX, TROPONINI in the last 168 hours. BNP: Invalid input(s): POCBNP CBG: No results for input(s): GLUCAP in the last 168 hours. D-Dimer No results for input(s): DDIMER in the last 72 hours. Hgb A1c No results for input(s): HGBA1C in the last 72 hours. Lipid Profile No results for input(s): CHOL, HDL, LDLCALC, TRIG, CHOLHDL, LDLDIRECT in the last 72 hours. Thyroid function studies No results for input(s): TSH, T4TOTAL, T3FREE, THYROIDAB in the last 72 hours.  Invalid input(s): FREET3 Anemia work up No results for input(s): VITAMINB12, FOLATE, FERRITIN, TIBC, IRON, RETICCTPCT in the last 72 hours. Urinalysis    Component Value Date/Time   COLORURINE YELLOW 02/27/2020 1417   APPEARANCEUR HAZY (A) 02/27/2020 1417   APPEARANCEUR Clear 12/10/2018 1422   LABSPEC 1.017 02/27/2020 1417   PHURINE 5.0 02/27/2020 1417   GLUCOSEU NEGATIVE 02/27/2020 1417   HGBUR NEGATIVE 02/27/2020 1417   BILIRUBINUR NEGATIVE 02/27/2020 1417   BILIRUBINUR Negative 12/10/2018 1422   KETONESUR NEGATIVE 02/27/2020 1417   PROTEINUR NEGATIVE 02/27/2020 1417   UROBILINOGEN 1.0 01/31/2013 1035   NITRITE NEGATIVE 02/27/2020 1417   LEUKOCYTESUR TRACE (A) 02/27/2020 1417   Sepsis Labs Invalid input(s): PROCALCITONIN,  WBC,  LACTICIDVEN Microbiology Recent Results (from the past 240 hour(s))  SARS CORONAVIRUS 2 (TAT 6-24  HRS) Nasopharyngeal Nasopharyngeal Swab     Status: None   Collection Time: 03/08/20  2:18 PM   Specimen: Nasopharyngeal Swab  Result Value Ref Range Status   SARS Coronavirus 2 NEGATIVE NEGATIVE Final    Comment: (NOTE) SARS-CoV-2 target nucleic acids are NOT DETECTED. The SARS-CoV-2 RNA is generally detectable in upper and lower respiratory specimens during the acute phase of infection. Negative results do not preclude SARS-CoV-2 infection, do not rule out co-infections with other pathogens, and should not be used as the sole basis for treatment or other patient management decisions. Negative results must be combined with clinical observations, patient history, and epidemiological information. The expected result is Negative. Fact Sheet for Patients: SugarRoll.be Fact Sheet for Healthcare Providers: https://www.woods-mathews.com/ This  test is not yet approved or cleared by the [place] and  has been authorized for detection and/or diagnosis of SARS-CoV-2 by FDA under an Emergency Use Authorization (EUA). This EUA will remain  in effect (meaning this test can be used) for the duration of the COVID-19 declaration under Section 56 4(b)(1) of the Act, 21 U.S.C. section 360bbb-3(b)(1), unless the authorization is terminated or revoked sooner. Performed at [facility], [place] [address], [place], [place] [address]      Time coordinating discharge:  I have spent 35 minutes face to face with the patient and on the ward discussing the patients care, assessment, plan and disposition with other care givers. >50% of the time was devoted counseling the patient about the risks and benefits of treatment/Discharge disposition and coordinating care.   SIGNED:   Damita Lack, MD  [facility] 03/09/2020, 10:27 AM   If 7PM-7AM, please contact night-coverage

## 2020-03-09 NOTE — Consult Note (Signed)
   [facility] CM Inpatient Consult   03/09/2020  [name] 17-May-1946 [id number]   Patient screened for extreme high risk score for unplanned readmission score and for length of stay hospitalization and  to check if potential [facility] needs for post hospital barriers to care.    Review of patient's medical record reveals patient is in the network under his [employer] PPO but also has Medicare noted.  Chart review in MD Discharge Summary today includes bur not limited to and reveals as follows for patient's hospital admission and ongoing needs:  Acute hypoxic respiratory failure secondary to COPD Acute on chronic COPD exacerbation, moderate  Primary Care Provider is Dr. Vonna Kotyk [place], [place], this is an Warden/ranger with Psychologist, educational.  Plan:  Review notes patient is for a skilled rehab transition to [facility].  Will notify the chronic care management team at [facility] of transition. Will sign off at transition/disposition.  For questions contact:   Natividad Brood, RN BSN [facility] Liaison  [phone] business mobile phone Toll free office [phone]  Fax number: [phone] [email or web address]

## 2020-03-09 NOTE — Progress Notes (Signed)
ANTICOAGULATION CONSULT NOTE  Pharmacy Consult: warfarin Indication: hx VTE  Allergies  Allergen Reactions  . Ativan [Lorazepam] Anxiety and Other (See Comments)    Hyper  [name]    Vital Signs: Temp: 97.3 F (36.3 C) (03/12 0758) BP: 123/81 (03/12 0758) Pulse Rate: 83 (03/12 0758)  Labs: Recent Labs    03/07/20 0238 03/08/20 0218 03/09/20 0241  LABPROT 17.9* 21.5* 31.0*  INR 1.5* 1.9* 3.0*  CREATININE 0.97 0.88 0.72    Estimated Creatinine Clearance: 92.9 mL/min (by C-G formula based on SCr of 0.72 mg/dL).   Assessment: 45 yom on chronic warfarin (history of PE) presented to the ED with cough and diarrhea. Pharmacy consulted to continue Coumadin from PTA.    INR large jump likely secondary to DDI with Azithro, INR 3.0. CBC wnl. No active bleed issues reported. Noted DDI with azithromycin, completed 5 day course on 3/11.  PTA regimen: 2mg  TTSa and 3mg  MWFSu  Goal of Therapy:  INR 2-3 Monitor platelets by anticoagulation protocol: Yes   Plan:  Hold warfarin x 1 today Monitor daily INR, CBC, s/sx bleeding  Alanda Slim, PharmD, [facility] Clinical Pharmacist Please see AMION for all Pharmacists' Contact Phone Numbers 03/09/2020, 9:53 AM

## 2020-03-12 ENCOUNTER — Inpatient Hospital Stay: Payer: BC Managed Care – PPO | Admitting: Primary Care

## 2020-03-12 DIAGNOSIS — I2510 Atherosclerotic heart disease of native coronary artery without angina pectoris: Secondary | ICD-10-CM | POA: Diagnosis not present

## 2020-03-12 DIAGNOSIS — F0390 Unspecified dementia without behavioral disturbance: Secondary | ICD-10-CM | POA: Diagnosis not present

## 2020-03-12 DIAGNOSIS — G4733 Obstructive sleep apnea (adult) (pediatric): Secondary | ICD-10-CM | POA: Diagnosis not present

## 2020-03-12 DIAGNOSIS — J449 Chronic obstructive pulmonary disease, unspecified: Secondary | ICD-10-CM | POA: Diagnosis not present

## 2020-03-13 ENCOUNTER — Ambulatory Visit (HOSPITAL_COMMUNITY): Payer: BC Managed Care – PPO

## 2020-03-14 ENCOUNTER — Ambulatory Visit: Payer: BC Managed Care – PPO | Admitting: Urology

## 2020-03-22 ENCOUNTER — Encounter: Payer: Self-pay | Admitting: Family Medicine

## 2020-03-28 DIAGNOSIS — I5033 Acute on chronic diastolic (congestive) heart failure: Secondary | ICD-10-CM | POA: Diagnosis not present

## 2020-03-28 DIAGNOSIS — F0390 Unspecified dementia without behavioral disturbance: Secondary | ICD-10-CM | POA: Diagnosis not present

## 2020-03-28 DIAGNOSIS — I2510 Atherosclerotic heart disease of native coronary artery without angina pectoris: Secondary | ICD-10-CM | POA: Diagnosis not present

## 2020-03-28 DIAGNOSIS — E785 Hyperlipidemia, unspecified: Secondary | ICD-10-CM | POA: Diagnosis not present

## 2020-03-28 DIAGNOSIS — J449 Chronic obstructive pulmonary disease, unspecified: Secondary | ICD-10-CM | POA: Diagnosis not present

## 2020-03-28 DIAGNOSIS — G4733 Obstructive sleep apnea (adult) (pediatric): Secondary | ICD-10-CM | POA: Diagnosis not present

## 2020-03-29 ENCOUNTER — Ambulatory Visit: Payer: Self-pay | Admitting: Family Medicine

## 2020-03-29 ENCOUNTER — Telehealth: Payer: Self-pay | Admitting: *Deleted

## 2020-03-29 DIAGNOSIS — Z86718 Personal history of other venous thrombosis and embolism: Secondary | ICD-10-CM

## 2020-03-29 DIAGNOSIS — Z86711 Personal history of pulmonary embolism: Secondary | ICD-10-CM

## 2020-03-29 LAB — POCT INR: INR: 3 (ref 2.0–3.0)

## 2020-03-29 NOTE — Telephone Encounter (Signed)
Left message with instructions on medication doses and to please call our office to confirm receiving.

## 2020-03-29 NOTE — Telephone Encounter (Signed)
continue to take take 2 mg every Tuesday, Thursday and Saturday and then continue on 3 mg every other day of the week  INR 3.0  (goal is 2-3)  Recheck in 1 to 2 weeks

## 2020-03-29 NOTE — Progress Notes (Signed)
continue to take take 2 mg every Tuesday, Thursday and Saturday and then continue on 3 mg every other day of the week  INR 3.0  (goal is 2-3)  Recheck in 1 to 2 weeks

## 2020-03-29 NOTE — Telephone Encounter (Signed)
Fax received mdINR PT/INR self testing service Test date/time 03/28/20 6:37 pm INR 3.0

## 2020-04-02 ENCOUNTER — Telehealth: Payer: Self-pay | Admitting: *Deleted

## 2020-04-02 ENCOUNTER — Ambulatory Visit (INDEPENDENT_AMBULATORY_CARE_PROVIDER_SITE_OTHER): Payer: BC Managed Care – PPO | Admitting: *Deleted

## 2020-04-02 DIAGNOSIS — I4901 Ventricular fibrillation: Secondary | ICD-10-CM | POA: Diagnosis not present

## 2020-04-02 DIAGNOSIS — I5020 Unspecified systolic (congestive) heart failure: Secondary | ICD-10-CM | POA: Diagnosis not present

## 2020-04-02 DIAGNOSIS — Z95810 Presence of automatic (implantable) cardiac defibrillator: Secondary | ICD-10-CM

## 2020-04-02 DIAGNOSIS — I5033 Acute on chronic diastolic (congestive) heart failure: Secondary | ICD-10-CM | POA: Diagnosis not present

## 2020-04-02 DIAGNOSIS — I2510 Atherosclerotic heart disease of native coronary artery without angina pectoris: Secondary | ICD-10-CM | POA: Diagnosis not present

## 2020-04-02 DIAGNOSIS — J439 Emphysema, unspecified: Secondary | ICD-10-CM | POA: Diagnosis not present

## 2020-04-02 DIAGNOSIS — L989 Disorder of the skin and subcutaneous tissue, unspecified: Secondary | ICD-10-CM | POA: Diagnosis not present

## 2020-04-02 DIAGNOSIS — I255 Ischemic cardiomyopathy: Secondary | ICD-10-CM | POA: Diagnosis not present

## 2020-04-02 DIAGNOSIS — I252 Old myocardial infarction: Secondary | ICD-10-CM | POA: Diagnosis not present

## 2020-04-02 DIAGNOSIS — I110 Hypertensive heart disease with heart failure: Secondary | ICD-10-CM | POA: Diagnosis not present

## 2020-04-02 DIAGNOSIS — C3411 Malignant neoplasm of upper lobe, right bronchus or lung: Secondary | ICD-10-CM | POA: Diagnosis not present

## 2020-04-02 DIAGNOSIS — R1310 Dysphagia, unspecified: Secondary | ICD-10-CM | POA: Diagnosis not present

## 2020-04-02 DIAGNOSIS — F0150 Vascular dementia without behavioral disturbance: Secondary | ICD-10-CM | POA: Diagnosis not present

## 2020-04-02 DIAGNOSIS — C3412 Malignant neoplasm of upper lobe, left bronchus or lung: Secondary | ICD-10-CM | POA: Diagnosis not present

## 2020-04-02 LAB — CUP PACEART REMOTE DEVICE CHECK
Battery Remaining Longevity: 41 mo
Battery Remaining Percentage: 40 %
Battery Voltage: 2.89 V
Brady Statistic RV Percent Paced: 1 %
Date Time Interrogation Session: 20210405042532
HighPow Impedance: 87 Ohm
HighPow Impedance: 87 Ohm
Implantable Lead Implant Date: 20140205
Implantable Lead Location: 753860
Implantable Lead Model: 181
Implantable Lead Serial Number: 323859
Implantable Pulse Generator Implant Date: 20140205
Lead Channel Impedance Value: 400 Ohm
Lead Channel Pacing Threshold Amplitude: 0.75 V
Lead Channel Pacing Threshold Pulse Width: 0.5 ms
Lead Channel Sensing Intrinsic Amplitude: 10.8 mV
Lead Channel Setting Pacing Amplitude: 2.5 V
Lead Channel Setting Pacing Pulse Width: 0.5 ms
Lead Channel Setting Sensing Sensitivity: 0.5 mV
Pulse Gen Serial Number: 7040910

## 2020-04-02 NOTE — Telephone Encounter (Signed)
Called patient's wife- [name] and rescheduled lab and scan for 04-17-20 - lab @ 9 am and scan to follow @ 10 am, pt. To have water only - four hrs. prior to test, lab and test to be @ [facility], pt. to receive results from [employer] on 04-19-20 @ 1:30 pm, patient's wife- verified understanding all appts.

## 2020-04-03 NOTE — Progress Notes (Signed)
ICD Remote  

## 2020-04-04 ENCOUNTER — Encounter: Payer: Self-pay | Admitting: Family Medicine

## 2020-04-04 ENCOUNTER — Ambulatory Visit (INDEPENDENT_AMBULATORY_CARE_PROVIDER_SITE_OTHER): Payer: BC Managed Care – PPO | Admitting: Family Medicine

## 2020-04-04 DIAGNOSIS — J9611 Chronic respiratory failure with hypoxia: Secondary | ICD-10-CM | POA: Diagnosis not present

## 2020-04-04 DIAGNOSIS — J189 Pneumonia, unspecified organism: Secondary | ICD-10-CM

## 2020-04-04 DIAGNOSIS — J449 Chronic obstructive pulmonary disease, unspecified: Secondary | ICD-10-CM | POA: Diagnosis not present

## 2020-04-04 NOTE — Progress Notes (Signed)
Virtual Visit via telephone Note  I connected with Travis Cruz on 04/04/20 at 1329 by telephone and verified that I am speaking with the correct person using two identifiers. Travis Cruz is currently located at home and wife  are currently with her during visit. The provider, Fransisca Kaufmann Israella Hubert, MD is located in their office at time of visit.  Call ended at 1400  I discussed the limitations, risks, security and privacy concerns of performing an evaluation and management service by telephone and the availability of in person appointments. I also discussed with the patient that there may be a patient responsible charge related to this service. The patient expressed understanding and agreed to proceed.   History and Present Illness: Patient has a pulm appt on 4/13 with pulmonology. Patient was on 02/27/20- 03/09/20 and then was at the [facility] until 03/28/20. He was in the hospital for pneumonia and acute respiratory failure. He is doing a lot better on breathing and no coughing and pulse ox in hte low 90's. He is still on 3LNC. He has been having issues with mood and anxiousness and she feels like the zoloft is not helping as much but he just got out of the hospital. He is sleeping well and is doing good otherwise  1. Pneumonia due to infectious organism, unspecified laterality, unspecified part of lung   2. Chronic respiratory failure with hypoxia [facility])     Outpatient Encounter Medications as of 04/04/2020  Medication Sig  . acetaminophen (TYLENOL) 325 MG tablet Take 2 tablets (650 mg total) by mouth every 6 (six) hours as needed for mild pain, fever or headache (or Fever >/= 101). (Patient not taking: Reported on 02/27/2020)  . atorvastatin (LIPITOR) 80 MG tablet Take 1 tablet (80 mg total) by mouth every Monday, Wednesday, and Friday.  . Cholecalciferol (VITAMIN D3) 2000 units TABS Take 2,000 Units by mouth daily.   . finasteride (PROSCAR) 5 MG tablet Take 1 tablet (5 mg total)  by mouth daily.  . furosemide (LASIX) 20 MG tablet Take 1 tablet (20 mg total) by mouth every morning.  . metoprolol tartrate (LOPRESSOR) 25 MG tablet Take 1.5 tablets (37.5 mg total) by mouth 2 (two) times daily.  . mirabegron ER (MYRBETRIQ) 50 MG TB24 tablet Take 1 tablet (50 mg total) by mouth every evening.  . Multiple Vitamin (MULTIVITAMIN WITH MINERALS) TABS tablet Take 1 tablet by mouth daily.  . Multiple Vitamins-Minerals (PRESERVISION AREDS PO) Take 1 capsule by mouth 2 (two) times daily.  . sertraline (ZOLOFT) 50 MG tablet Take 1 tablet (50 mg total) by mouth daily.  . tamsulosin (FLOMAX) 0.4 MG CAPS capsule Take 1 capsule (0.4 mg total) by mouth daily after supper. (Needs to be seen before next refill)  . tiotropium (SPIRIVA HANDIHALER) 18 MCG inhalation capsule Place 1 capsule (18 mcg total) into inhaler and inhale daily.  [name] warfarin (COUMADIN) 2 MG tablet Take 1 tablet (2 mg total) by mouth See admin instructions. Take 453m by mouth every tues, thurs, sat. (Takes 344mon all other days) (Patient taking differently: Take 2 mg by mouth See admin instructions. Take one tablet (2 mg) by mouth every Tuesday, Thursday, Saturday with supper (take 3 mg tablet on all other evenings))  . warfarin (COUMADIN) 3 MG tablet Take 1 tablet (3 mg total) by mouth See admin instructions. Takes 53m67mn all days except on Tuesdays Thursdays and Saturdays (Takes 2mg47m Tuesdays, Thursdays, and Saturdays) (Patient taking differently: Take 3 mg  by mouth See admin instructions. Take one tablet (3 mg) by mouth Sunday, Monday, Wednesday, Friday with supper (take 2 mg tablet on all other evenings))  . [DISCONTINUED] acetaminophen (TYLENOL) 500 MG tablet Take 1,000 mg by mouth every 6 (six) hours as needed for headache (pain).  . [DISCONTINUED] albuterol (PROVENTIL) (2.5 MG/3ML) 0.083% nebulizer solution Take 3 mLs (2.5 mg total) by nebulization every 4 (four) hours as needed for wheezing or shortness of breath.  .  [DISCONTINUED] arformoterol (BROVANA) 15 MCG/2ML NEBU Take 2 mLs (15 mcg total) by nebulization 2 (two) times daily.  . [DISCONTINUED] budesonide (PULMICORT) 0.5 MG/2ML nebulizer solution Take 2 mLs (0.5 mg total) by nebulization 2 (two) times daily.  . [DISCONTINUED] dextromethorphan-guaiFENesin (MUCINEX DM) 30-600 MG 12hr tablet Take 1 tablet by mouth 2 (two) times daily as needed for cough.  . [DISCONTINUED] ipratropium-albuterol (DUONEB) 0.5-2.5 (3) MG/3ML SOLN Take 3 mLs by nebulization every 4 (four) hours as needed.   No facility-administered encounter medications on file as of 04/04/2020.    Review of Systems  Constitutional: Negative for chills and fever.  Eyes: Negative for visual disturbance.  Respiratory: Negative for shortness of breath and wheezing.   Cardiovascular: Negative for chest pain and leg swelling.  Musculoskeletal: Negative for back pain and gait problem.  Skin: Negative for rash.  Neurological: Positive for weakness. Negative for dizziness and light-headedness.  Psychiatric/Behavioral: Positive for dysphoric mood. Negative for self-injury, sleep disturbance and suicidal ideas. The patient is nervous/anxious.   All other systems reviewed and are negative.   Observations/Objective: Patient sounds comfortable and in no acute distress  Assessment and Plan: Problem List Items Addressed This Visit      Respiratory   Chronic respiratory failure with hypoxia ([place])   Relevant Orders   CBC with Differential/Platelet   CMP14+EGFR   Pneumonia - Primary   Relevant Orders   CBC with Differential/Platelet   CMP14+EGFR      Has home health , is on 3Lnc and continue with that.  Follow up plan: Return in about 4 weeks (around 05/02/2020), or if symptoms worsen or fail to improve.     I discussed the assessment and treatment plan with the patient. The patient was provided an opportunity to ask questions and all were answered. The patient agreed with the plan and  demonstrated an understanding of the instructions.   The patient was advised to call back or seek an in-person evaluation if the symptoms worsen or if the condition fails to improve as anticipated.  The above assessment and management plan was discussed with the patient. The patient verbalized understanding of and has agreed to the management plan. Patient is aware to call the clinic if symptoms persist or worsen. Patient is aware when to return to the clinic for a follow-up visit. Patient educated on when it is appropriate to go to the emergency department.    I provided 11 minutes of non-face-to-face time during this encounter.    Worthy Rancher, MD

## 2020-04-05 ENCOUNTER — Telehealth: Payer: Self-pay | Admitting: *Deleted

## 2020-04-05 DIAGNOSIS — I110 Hypertensive heart disease with heart failure: Secondary | ICD-10-CM | POA: Diagnosis not present

## 2020-04-05 DIAGNOSIS — I2510 Atherosclerotic heart disease of native coronary artery without angina pectoris: Secondary | ICD-10-CM | POA: Diagnosis not present

## 2020-04-05 DIAGNOSIS — I4901 Ventricular fibrillation: Secondary | ICD-10-CM | POA: Diagnosis not present

## 2020-04-05 DIAGNOSIS — L989 Disorder of the skin and subcutaneous tissue, unspecified: Secondary | ICD-10-CM | POA: Diagnosis not present

## 2020-04-05 DIAGNOSIS — C3412 Malignant neoplasm of upper lobe, left bronchus or lung: Secondary | ICD-10-CM | POA: Diagnosis not present

## 2020-04-05 DIAGNOSIS — Z86711 Personal history of pulmonary embolism: Secondary | ICD-10-CM

## 2020-04-05 DIAGNOSIS — I255 Ischemic cardiomyopathy: Secondary | ICD-10-CM | POA: Diagnosis not present

## 2020-04-05 DIAGNOSIS — C3411 Malignant neoplasm of upper lobe, right bronchus or lung: Secondary | ICD-10-CM | POA: Diagnosis not present

## 2020-04-05 DIAGNOSIS — R1310 Dysphagia, unspecified: Secondary | ICD-10-CM | POA: Diagnosis not present

## 2020-04-05 DIAGNOSIS — J439 Emphysema, unspecified: Secondary | ICD-10-CM | POA: Diagnosis not present

## 2020-04-05 DIAGNOSIS — Z86718 Personal history of other venous thrombosis and embolism: Secondary | ICD-10-CM

## 2020-04-05 DIAGNOSIS — I5033 Acute on chronic diastolic (congestive) heart failure: Secondary | ICD-10-CM | POA: Diagnosis not present

## 2020-04-05 DIAGNOSIS — I252 Old myocardial infarction: Secondary | ICD-10-CM | POA: Diagnosis not present

## 2020-04-05 DIAGNOSIS — I5020 Unspecified systolic (congestive) heart failure: Secondary | ICD-10-CM | POA: Diagnosis not present

## 2020-04-05 DIAGNOSIS — F0150 Vascular dementia without behavioral disturbance: Secondary | ICD-10-CM | POA: Diagnosis not present

## 2020-04-05 NOTE — Telephone Encounter (Signed)
Patients daughter notified and verbalized understanding 

## 2020-04-05 NOTE — Telephone Encounter (Signed)
Description   continue to take take 2 mg every Tuesday, Thursday and Saturday and then continue on 3 mg every other day of the week  INR 2.5  (goal is 2-3)  Recheck in 1 to 2 weeks       Caryl Pina, MD [facility] 04/05/2020, 3:42 PM

## 2020-04-05 NOTE — Telephone Encounter (Signed)
Fax received mdINR PT/INR self testing service Test date/time 04/04/20 554pm INR 2.5

## 2020-04-06 ENCOUNTER — Other Ambulatory Visit: Payer: Medicare Other

## 2020-04-06 ENCOUNTER — Other Ambulatory Visit: Payer: Self-pay

## 2020-04-06 DIAGNOSIS — C3411 Malignant neoplasm of upper lobe, right bronchus or lung: Secondary | ICD-10-CM | POA: Diagnosis not present

## 2020-04-06 DIAGNOSIS — I2510 Atherosclerotic heart disease of native coronary artery without angina pectoris: Secondary | ICD-10-CM | POA: Diagnosis not present

## 2020-04-06 DIAGNOSIS — I5033 Acute on chronic diastolic (congestive) heart failure: Secondary | ICD-10-CM | POA: Diagnosis not present

## 2020-04-06 DIAGNOSIS — J189 Pneumonia, unspecified organism: Secondary | ICD-10-CM | POA: Diagnosis not present

## 2020-04-06 DIAGNOSIS — J9611 Chronic respiratory failure with hypoxia: Secondary | ICD-10-CM | POA: Diagnosis not present

## 2020-04-06 DIAGNOSIS — I110 Hypertensive heart disease with heart failure: Secondary | ICD-10-CM | POA: Diagnosis not present

## 2020-04-06 DIAGNOSIS — I255 Ischemic cardiomyopathy: Secondary | ICD-10-CM | POA: Diagnosis not present

## 2020-04-06 DIAGNOSIS — I5020 Unspecified systolic (congestive) heart failure: Secondary | ICD-10-CM | POA: Diagnosis not present

## 2020-04-06 DIAGNOSIS — L989 Disorder of the skin and subcutaneous tissue, unspecified: Secondary | ICD-10-CM | POA: Diagnosis not present

## 2020-04-06 DIAGNOSIS — J439 Emphysema, unspecified: Secondary | ICD-10-CM | POA: Diagnosis not present

## 2020-04-06 DIAGNOSIS — F0150 Vascular dementia without behavioral disturbance: Secondary | ICD-10-CM | POA: Diagnosis not present

## 2020-04-06 DIAGNOSIS — C3412 Malignant neoplasm of upper lobe, left bronchus or lung: Secondary | ICD-10-CM | POA: Diagnosis not present

## 2020-04-06 DIAGNOSIS — I252 Old myocardial infarction: Secondary | ICD-10-CM | POA: Diagnosis not present

## 2020-04-06 DIAGNOSIS — R1310 Dysphagia, unspecified: Secondary | ICD-10-CM | POA: Diagnosis not present

## 2020-04-06 DIAGNOSIS — I4901 Ventricular fibrillation: Secondary | ICD-10-CM | POA: Diagnosis not present

## 2020-04-07 LAB — CMP14+EGFR
ALT: 21 IU/L (ref 0–44)
AST: 25 IU/L (ref 0–40)
Albumin/Globulin Ratio: 1.6 (ref 1.2–2.2)
Albumin: 3.5 g/dL — ABNORMAL LOW (ref 3.7–4.7)
Alkaline Phosphatase: 163 IU/L — ABNORMAL HIGH (ref 39–117)
BUN/Creatinine Ratio: 13 (ref 10–24)
BUN: 9 mg/dL (ref 8–27)
Bilirubin Total: <0.2 mg/dL (ref 0.0–1.2)
CO2: 27 mmol/L (ref 20–29)
Calcium: 9.2 mg/dL (ref 8.6–10.2)
Chloride: 102 mmol/L (ref 96–106)
Creatinine, Ser: 0.68 mg/dL — ABNORMAL LOW (ref 0.76–1.27)
GFR calc Af Amer: 110 mL/min/{1.73_m2} (ref >59)
GFR calc non Af Amer: 95 mL/min/{1.73_m2} (ref >59)
Globulin, Total: 2.2 g/dL (ref 1.5–4.5)
Glucose: 102 mg/dL — ABNORMAL HIGH (ref 65–99)
Potassium: 4.4 mmol/L (ref 3.5–5.2)
Sodium: 143 mmol/L (ref 134–144)
Total Protein: 5.7 g/dL — ABNORMAL LOW (ref 6.0–8.5)

## 2020-04-07 LAB — CBC WITH DIFFERENTIAL/PLATELET
Basophils Absolute: 0.1 10*3/uL (ref 0.0–0.2)
Basos: 1 %
EOS (ABSOLUTE): 0 10*3/uL (ref 0.0–0.4)
Eos: 0 %
Hematocrit: 46.4 % (ref 37.5–51.0)
Hemoglobin: 15 g/dL (ref 13.0–17.7)
Immature Grans (Abs): 0.3 10*3/uL — ABNORMAL HIGH (ref 0.0–0.1)
Immature Granulocytes: 2 %
Lymphocytes Absolute: 2.7 10*3/uL (ref 0.7–3.1)
Lymphs: 21 %
MCH: 28.4 pg (ref 26.6–33.0)
MCHC: 32.3 g/dL (ref 31.5–35.7)
MCV: 88 fL (ref 79–97)
Monocytes Absolute: 1.7 10*3/uL — ABNORMAL HIGH (ref 0.1–0.9)
Monocytes: 13 %
Neutrophils Absolute: 8.2 10*3/uL — ABNORMAL HIGH (ref 1.4–7.0)
Neutrophils: 63 %
Platelets: 439 10*3/uL (ref 150–450)
RBC: 5.29 x10E6/uL (ref 4.14–5.80)
RDW: 15.6 % — ABNORMAL HIGH (ref 11.6–15.4)
WBC: 13 10*3/uL — ABNORMAL HIGH (ref 3.4–10.8)

## 2020-04-10 ENCOUNTER — Encounter: Payer: Self-pay | Admitting: Family Medicine

## 2020-04-10 ENCOUNTER — Other Ambulatory Visit: Payer: Self-pay

## 2020-04-10 ENCOUNTER — Ambulatory Visit: Payer: BC Managed Care – PPO | Admitting: Primary Care

## 2020-04-10 ENCOUNTER — Encounter: Payer: Self-pay | Admitting: Primary Care

## 2020-04-10 VITALS — BP 100/58 | HR 83 | Temp 97.2°F | Ht 69.0 in | Wt 207.0 lb

## 2020-04-10 DIAGNOSIS — J439 Emphysema, unspecified: Secondary | ICD-10-CM | POA: Diagnosis not present

## 2020-04-10 DIAGNOSIS — C3411 Malignant neoplasm of upper lobe, right bronchus or lung: Secondary | ICD-10-CM | POA: Diagnosis not present

## 2020-04-10 DIAGNOSIS — I2510 Atherosclerotic heart disease of native coronary artery without angina pectoris: Secondary | ICD-10-CM | POA: Diagnosis not present

## 2020-04-10 DIAGNOSIS — J189 Pneumonia, unspecified organism: Secondary | ICD-10-CM

## 2020-04-10 DIAGNOSIS — R1310 Dysphagia, unspecified: Secondary | ICD-10-CM | POA: Diagnosis not present

## 2020-04-10 DIAGNOSIS — R911 Solitary pulmonary nodule: Secondary | ICD-10-CM | POA: Diagnosis not present

## 2020-04-10 DIAGNOSIS — I110 Hypertensive heart disease with heart failure: Secondary | ICD-10-CM | POA: Diagnosis not present

## 2020-04-10 DIAGNOSIS — I255 Ischemic cardiomyopathy: Secondary | ICD-10-CM | POA: Diagnosis not present

## 2020-04-10 DIAGNOSIS — G4733 Obstructive sleep apnea (adult) (pediatric): Secondary | ICD-10-CM

## 2020-04-10 DIAGNOSIS — J449 Chronic obstructive pulmonary disease, unspecified: Secondary | ICD-10-CM | POA: Diagnosis not present

## 2020-04-10 DIAGNOSIS — I5033 Acute on chronic diastolic (congestive) heart failure: Secondary | ICD-10-CM | POA: Diagnosis not present

## 2020-04-10 DIAGNOSIS — I4901 Ventricular fibrillation: Secondary | ICD-10-CM | POA: Diagnosis not present

## 2020-04-10 DIAGNOSIS — I5020 Unspecified systolic (congestive) heart failure: Secondary | ICD-10-CM | POA: Diagnosis not present

## 2020-04-10 DIAGNOSIS — F0150 Vascular dementia without behavioral disturbance: Secondary | ICD-10-CM | POA: Diagnosis not present

## 2020-04-10 DIAGNOSIS — I252 Old myocardial infarction: Secondary | ICD-10-CM | POA: Diagnosis not present

## 2020-04-10 DIAGNOSIS — L989 Disorder of the skin and subcutaneous tissue, unspecified: Secondary | ICD-10-CM | POA: Diagnosis not present

## 2020-04-10 DIAGNOSIS — C3412 Malignant neoplasm of upper lobe, left bronchus or lung: Secondary | ICD-10-CM | POA: Diagnosis not present

## 2020-04-10 MED ORDER — TRELEGY ELLIPTA 100-62.5-25 MCG/INH IN AEPB: 1.0000 | INHALATION_SPRAY | Freq: Every day | RESPIRATORY_TRACT | 0 refills | Status: DC

## 2020-04-10 NOTE — Assessment & Plan Note (Addendum)
-   02/27/20 CT  Showed focal area of ground glass opacity RUL which appeared larger and more diffuse than prior CT scans felt to possibly represent atelectasis/scarring or infiltrate. Neoplasm not excluded. - Follow-up CT scheduled for next week

## 2020-04-10 NOTE — Patient Instructions (Signed)
Pleasure meeting you today [name], I am glad that you are feeling better  Recommendations: - Stop Spiriva HandiHaler - Start Trelegy-1 puff once daily in the morning (rinse mouth out after use) - Start azithromycin 250 mg Monday Wednesday Friday (this is an antibiotic that you take 3 times a week to prevent frequency of COPD exacerbations/pneumonia.  We will need to occasionally check an EKG to monitor your QTc interval)  Orders: - Pulmonary function tests in 6 to 8 weeks  Follow-up: 2-week televisit with [facility] NP;  And 2 months with Dr. Valeta Harms (new patient visit)     Chronic Obstructive Pulmonary Disease Chronic obstructive pulmonary disease (COPD) is a long-term (chronic) lung problem. When you have COPD, it is hard for air to get in and out of your lungs. Usually the condition gets worse over time, and your lungs will never return to normal. There are things you can do to keep yourself as healthy as possible.  Your doctor may treat your condition with: ? Medicines. ? Oxygen. ? Lung surgery.  Your doctor may also recommend: ? Rehabilitation. This includes steps to make your body work better. It may involve a team of specialists. ? Quitting smoking, if you smoke. ? Exercise and changes to your diet. ? Comfort measures (palliative care). Follow these instructions at home: Medicines  Take over-the-counter and prescription medicines only as told by your doctor.  Talk to your doctor before taking any cough or allergy medicines. You may need to avoid medicines that cause your lungs to be dry. Lifestyle  If you smoke, stop. Smoking makes the problem worse. If you need help quitting, ask your doctor.  Avoid being around things that make your breathing worse. This may include smoke, chemicals, and fumes.  Stay active, but remember to rest as well.  Learn and use tips on how to relax.  Make sure you get enough sleep. Most adults need at least 7 hours of sleep every  night.  Eat healthy foods. Eat smaller meals more often. Rest before meals. Controlled breathing Learn and use tips on how to control your breathing as told by your doctor. Try:  Breathing in (inhaling) through your nose for 1 second. Then, pucker your lips and breath out (exhale) through your lips for 2 seconds.  Putting one hand on your belly (abdomen). Breathe in slowly through your nose for 1 second. Your hand on your belly should move out. Pucker your lips and breathe out slowly through your lips. Your hand on your belly should move in as you breathe out.  Controlled coughing Learn and use controlled coughing to clear mucus from your lungs. Follow these steps: 1. Lean your head a little forward. 2. Breathe in deeply. 3. Try to hold your breath for 3 seconds. 4. Keep your mouth slightly open while coughing 2 times. 5. Spit any mucus out into a tissue. 6. Rest and do the steps again 1 or 2 times as needed. General instructions  Make sure you get all the shots (vaccines) that your doctor recommends. Ask your doctor about a flu shot and a pneumonia shot.  Use oxygen therapy and pulmonary rehabilitation if told by your doctor. If you need home oxygen therapy, ask your doctor if you should buy a tool to measure your oxygen level (oximeter).  Make a COPD action plan with your doctor. This helps you to know what to do if you feel worse than usual.  Manage any other conditions you have as told by your  doctor.  Avoid going outside when it is very hot, cold, or humid.  Avoid people who have a sickness you can catch (contagious).  Keep all follow-up visits as told by your doctor. This is important. Contact a doctor if:  You cough up more mucus than usual.  There is a change in the color or thickness of the mucus.  It is harder to breathe than usual.  Your breathing is faster than usual.  You have trouble sleeping.  You need to use your medicines more often than usual.  You  have trouble doing your normal activities such as getting dressed or walking around the house. Get help right away if:  You have shortness of breath while resting.  You have shortness of breath that stops you from: ? Being able to talk. ? Doing normal activities.  Your chest hurts for longer than 5 minutes.  Your skin color is more blue than usual.  Your pulse oximeter shows that you have low oxygen for longer than 5 minutes.  You have a fever.  You feel too tired to breathe normally. Summary  Chronic obstructive pulmonary disease (COPD) is a long-term lung problem.  The way your lungs work will never return to normal. Usually the condition gets worse over time. There are things you can do to keep yourself as healthy as possible.  Take over-the-counter and prescription medicines only as told by your doctor.  If you smoke, stop. Smoking makes the problem worse. This information is not intended to replace advice given to you by your health care provider. Make sure you discuss any questions you have with your health care provider. Document Revised: 11/27/2017 Document Reviewed: 01/19/2017 Elsevier Patient Education  2020 [employer].

## 2020-04-10 NOTE — Assessment & Plan Note (Addendum)
-   Compliant with BIPAP, managed by [facility] - Patient is having some issues with mask seal and air leak - AHI is <1/hr after reviewing patient phone download
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
## 2020-04-10 NOTE — Assessment & Plan Note (Addendum)
-   Improved clinically following IV steriods/pred taper and Azithromycin 500mg  x 5 days - No residual cough

## 2020-04-10 NOTE — Assessment & Plan Note (Addendum)
-   Former smoker quit in 2009 - Starting azithromycin MWF and ICS/LABA LAMA per pulmonary recommendations - We will need to check an EKG at regular intervals to monitor QTc interval while on Macrolide - Trial Trelegy 1 puff daily (sample given); Stop Spiriva handihaler - FU televisit in 2 weeks, establish new patient with Dr. Valeta Harms in 6-8 weeks

## 2020-04-11 DIAGNOSIS — I4901 Ventricular fibrillation: Secondary | ICD-10-CM | POA: Diagnosis not present

## 2020-04-11 DIAGNOSIS — I2510 Atherosclerotic heart disease of native coronary artery without angina pectoris: Secondary | ICD-10-CM | POA: Diagnosis not present

## 2020-04-11 DIAGNOSIS — I255 Ischemic cardiomyopathy: Secondary | ICD-10-CM | POA: Diagnosis not present

## 2020-04-11 DIAGNOSIS — I5020 Unspecified systolic (congestive) heart failure: Secondary | ICD-10-CM | POA: Diagnosis not present

## 2020-04-11 DIAGNOSIS — R1310 Dysphagia, unspecified: Secondary | ICD-10-CM | POA: Diagnosis not present

## 2020-04-11 DIAGNOSIS — F0150 Vascular dementia without behavioral disturbance: Secondary | ICD-10-CM | POA: Diagnosis not present

## 2020-04-11 DIAGNOSIS — C3412 Malignant neoplasm of upper lobe, left bronchus or lung: Secondary | ICD-10-CM | POA: Diagnosis not present

## 2020-04-11 DIAGNOSIS — J439 Emphysema, unspecified: Secondary | ICD-10-CM | POA: Diagnosis not present

## 2020-04-11 DIAGNOSIS — I5033 Acute on chronic diastolic (congestive) heart failure: Secondary | ICD-10-CM | POA: Diagnosis not present

## 2020-04-11 DIAGNOSIS — C3411 Malignant neoplasm of upper lobe, right bronchus or lung: Secondary | ICD-10-CM | POA: Diagnosis not present

## 2020-04-11 DIAGNOSIS — L989 Disorder of the skin and subcutaneous tissue, unspecified: Secondary | ICD-10-CM | POA: Diagnosis not present

## 2020-04-11 DIAGNOSIS — I252 Old myocardial infarction: Secondary | ICD-10-CM | POA: Diagnosis not present

## 2020-04-11 DIAGNOSIS — I110 Hypertensive heart disease with heart failure: Secondary | ICD-10-CM | POA: Diagnosis not present

## 2020-04-12 ENCOUNTER — Telehealth: Payer: Self-pay | Admitting: *Deleted

## 2020-04-12 DIAGNOSIS — Z86718 Personal history of other venous thrombosis and embolism: Secondary | ICD-10-CM

## 2020-04-12 DIAGNOSIS — Z86711 Personal history of pulmonary embolism: Secondary | ICD-10-CM

## 2020-04-12 MED ORDER — AZITHROMYCIN 250 MG PO TABS: 250.0000 mg | ORAL_TABLET | ORAL | 5 refills | Status: DC

## 2020-04-12 NOTE — Telephone Encounter (Signed)
Fax received mdINR PT/INR self testing service Test date/time 04/11/20 618p INR 3.6

## 2020-04-12 NOTE — Telephone Encounter (Signed)
Description   Hold Coumadin today 03/13/2020 and then continue to take take 2 mg every Tuesday, Thursday and Saturday and then continue on 3 mg every other day of the week  INR 3.6  (goal is 2-3)  Recheck in 1 to 2 weeks       Caryl Pina, MD [place] 04/12/2020, 9:16 PM

## 2020-04-13 DIAGNOSIS — I5020 Unspecified systolic (congestive) heart failure: Secondary | ICD-10-CM | POA: Diagnosis not present

## 2020-04-13 DIAGNOSIS — I2510 Atherosclerotic heart disease of native coronary artery without angina pectoris: Secondary | ICD-10-CM | POA: Diagnosis not present

## 2020-04-13 DIAGNOSIS — I5033 Acute on chronic diastolic (congestive) heart failure: Secondary | ICD-10-CM | POA: Diagnosis not present

## 2020-04-13 DIAGNOSIS — C3412 Malignant neoplasm of upper lobe, left bronchus or lung: Secondary | ICD-10-CM | POA: Diagnosis not present

## 2020-04-13 DIAGNOSIS — L989 Disorder of the skin and subcutaneous tissue, unspecified: Secondary | ICD-10-CM | POA: Diagnosis not present

## 2020-04-13 DIAGNOSIS — I255 Ischemic cardiomyopathy: Secondary | ICD-10-CM | POA: Diagnosis not present

## 2020-04-13 DIAGNOSIS — C3411 Malignant neoplasm of upper lobe, right bronchus or lung: Secondary | ICD-10-CM | POA: Diagnosis not present

## 2020-04-13 DIAGNOSIS — F0150 Vascular dementia without behavioral disturbance: Secondary | ICD-10-CM | POA: Diagnosis not present

## 2020-04-13 DIAGNOSIS — I252 Old myocardial infarction: Secondary | ICD-10-CM | POA: Diagnosis not present

## 2020-04-13 DIAGNOSIS — I110 Hypertensive heart disease with heart failure: Secondary | ICD-10-CM | POA: Diagnosis not present

## 2020-04-13 DIAGNOSIS — J439 Emphysema, unspecified: Secondary | ICD-10-CM | POA: Diagnosis not present

## 2020-04-13 DIAGNOSIS — R1310 Dysphagia, unspecified: Secondary | ICD-10-CM | POA: Diagnosis not present

## 2020-04-13 DIAGNOSIS — I4901 Ventricular fibrillation: Secondary | ICD-10-CM | POA: Diagnosis not present

## 2020-04-13 NOTE — Telephone Encounter (Signed)
Wife aware of results and Coumadin dosing instructions.

## 2020-04-14 NOTE — Progress Notes (Signed)
PCCM: Thanks for seeing him. Garner Nash, DO [facility] 04/14/2020 11:58 AM

## 2020-04-16 DIAGNOSIS — I2510 Atherosclerotic heart disease of native coronary artery without angina pectoris: Secondary | ICD-10-CM | POA: Diagnosis not present

## 2020-04-16 DIAGNOSIS — I252 Old myocardial infarction: Secondary | ICD-10-CM | POA: Diagnosis not present

## 2020-04-16 DIAGNOSIS — I110 Hypertensive heart disease with heart failure: Secondary | ICD-10-CM | POA: Diagnosis not present

## 2020-04-16 DIAGNOSIS — L989 Disorder of the skin and subcutaneous tissue, unspecified: Secondary | ICD-10-CM | POA: Diagnosis not present

## 2020-04-16 DIAGNOSIS — C3412 Malignant neoplasm of upper lobe, left bronchus or lung: Secondary | ICD-10-CM | POA: Diagnosis not present

## 2020-04-16 DIAGNOSIS — I255 Ischemic cardiomyopathy: Secondary | ICD-10-CM | POA: Diagnosis not present

## 2020-04-16 DIAGNOSIS — I5033 Acute on chronic diastolic (congestive) heart failure: Secondary | ICD-10-CM | POA: Diagnosis not present

## 2020-04-16 DIAGNOSIS — I5020 Unspecified systolic (congestive) heart failure: Secondary | ICD-10-CM | POA: Diagnosis not present

## 2020-04-16 DIAGNOSIS — F0150 Vascular dementia without behavioral disturbance: Secondary | ICD-10-CM | POA: Diagnosis not present

## 2020-04-16 DIAGNOSIS — J439 Emphysema, unspecified: Secondary | ICD-10-CM | POA: Diagnosis not present

## 2020-04-16 DIAGNOSIS — C3411 Malignant neoplasm of upper lobe, right bronchus or lung: Secondary | ICD-10-CM | POA: Diagnosis not present

## 2020-04-16 DIAGNOSIS — I4901 Ventricular fibrillation: Secondary | ICD-10-CM | POA: Diagnosis not present

## 2020-04-16 DIAGNOSIS — R1310 Dysphagia, unspecified: Secondary | ICD-10-CM | POA: Diagnosis not present

## 2020-04-17 ENCOUNTER — Ambulatory Visit (HOSPITAL_COMMUNITY)
Admission: RE | Admit: 2020-04-17 | Discharge: 2020-04-17 | Disposition: A | Discharge status: Home / Self Care | Payer: BC Managed Care – PPO | Source: Ambulatory Visit | Attending: Urology | Admitting: Urology

## 2020-04-17 ENCOUNTER — Other Ambulatory Visit: Payer: Self-pay

## 2020-04-17 ENCOUNTER — Other Ambulatory Visit (HOSPITAL_COMMUNITY)
Admission: RE | Admit: 2020-04-17 | Discharge: 2020-04-17 | Disposition: A | Discharge status: Home / Self Care | Payer: BC Managed Care – PPO | Source: Ambulatory Visit | Attending: Urology | Admitting: Urology

## 2020-04-17 DIAGNOSIS — C3411 Malignant neoplasm of upper lobe, right bronchus or lung: Secondary | ICD-10-CM

## 2020-04-17 DIAGNOSIS — I2510 Atherosclerotic heart disease of native coronary artery without angina pectoris: Secondary | ICD-10-CM | POA: Diagnosis not present

## 2020-04-17 DIAGNOSIS — I5033 Acute on chronic diastolic (congestive) heart failure: Secondary | ICD-10-CM | POA: Diagnosis not present

## 2020-04-17 DIAGNOSIS — I4901 Ventricular fibrillation: Secondary | ICD-10-CM | POA: Diagnosis not present

## 2020-04-17 DIAGNOSIS — R1310 Dysphagia, unspecified: Secondary | ICD-10-CM | POA: Diagnosis not present

## 2020-04-17 DIAGNOSIS — C3412 Malignant neoplasm of upper lobe, left bronchus or lung: Secondary | ICD-10-CM | POA: Diagnosis not present

## 2020-04-17 DIAGNOSIS — C3431 Malignant neoplasm of lower lobe, right bronchus or lung: Secondary | ICD-10-CM | POA: Diagnosis not present

## 2020-04-17 DIAGNOSIS — I110 Hypertensive heart disease with heart failure: Secondary | ICD-10-CM | POA: Diagnosis not present

## 2020-04-17 DIAGNOSIS — I252 Old myocardial infarction: Secondary | ICD-10-CM | POA: Diagnosis not present

## 2020-04-17 DIAGNOSIS — I5020 Unspecified systolic (congestive) heart failure: Secondary | ICD-10-CM | POA: Diagnosis not present

## 2020-04-17 DIAGNOSIS — I255 Ischemic cardiomyopathy: Secondary | ICD-10-CM | POA: Diagnosis not present

## 2020-04-17 DIAGNOSIS — L989 Disorder of the skin and subcutaneous tissue, unspecified: Secondary | ICD-10-CM | POA: Diagnosis not present

## 2020-04-17 DIAGNOSIS — J439 Emphysema, unspecified: Secondary | ICD-10-CM | POA: Diagnosis not present

## 2020-04-17 DIAGNOSIS — F0150 Vascular dementia without behavioral disturbance: Secondary | ICD-10-CM | POA: Diagnosis not present

## 2020-04-17 DIAGNOSIS — C3490 Malignant neoplasm of unspecified part of unspecified bronchus or lung: Secondary | ICD-10-CM | POA: Diagnosis not present

## 2020-04-17 LAB — CREATININE, SERUM
Creatinine, Ser: 0.74 mg/dL (ref 0.61–1.24)
GFR calc Af Amer: >60 mL/min (ref >60)
GFR calc non Af Amer: >60 mL/min (ref >60)

## 2020-04-17 LAB — BUN: BUN: 13 mg/dL (ref 8–23)

## 2020-04-17 MED ORDER — IOHEXOL 300 MG/ML  SOLN
75.0000 mL | Freq: Once | INTRAMUSCULAR | Status: AC | PRN
  Administered 2020-04-17: 75 mL via INTRAVENOUS

## 2020-04-17 NOTE — Progress Notes (Signed)
bun

## 2020-04-18 DIAGNOSIS — Z7901 Long term (current) use of anticoagulants: Secondary | ICD-10-CM | POA: Diagnosis not present

## 2020-04-18 DIAGNOSIS — Z86718 Personal history of other venous thrombosis and embolism: Secondary | ICD-10-CM | POA: Diagnosis not present

## 2020-04-19 ENCOUNTER — Ambulatory Visit: Payer: Self-pay | Admitting: Urology

## 2020-04-19 ENCOUNTER — Other Ambulatory Visit: Payer: Self-pay

## 2020-04-19 ENCOUNTER — Telehealth: Payer: Self-pay | Admitting: *Deleted

## 2020-04-19 ENCOUNTER — Ambulatory Visit
Admission: RE | Admit: 2020-04-19 | Discharge: 2020-04-19 | Disposition: A | Discharge status: Home / Self Care | Payer: BC Managed Care – PPO | Source: Ambulatory Visit | Attending: Urology | Admitting: Urology

## 2020-04-19 ENCOUNTER — Encounter: Payer: Self-pay | Admitting: Family Medicine

## 2020-04-19 ENCOUNTER — Encounter: Payer: Self-pay | Admitting: Urology

## 2020-04-19 DIAGNOSIS — I255 Ischemic cardiomyopathy: Secondary | ICD-10-CM | POA: Diagnosis not present

## 2020-04-19 DIAGNOSIS — I2510 Atherosclerotic heart disease of native coronary artery without angina pectoris: Secondary | ICD-10-CM | POA: Diagnosis not present

## 2020-04-19 DIAGNOSIS — C3411 Malignant neoplasm of upper lobe, right bronchus or lung: Secondary | ICD-10-CM

## 2020-04-19 DIAGNOSIS — C3431 Malignant neoplasm of lower lobe, right bronchus or lung: Secondary | ICD-10-CM

## 2020-04-19 DIAGNOSIS — Z9981 Dependence on supplemental oxygen: Secondary | ICD-10-CM | POA: Diagnosis not present

## 2020-04-19 DIAGNOSIS — Z86711 Personal history of pulmonary embolism: Secondary | ICD-10-CM

## 2020-04-19 DIAGNOSIS — I252 Old myocardial infarction: Secondary | ICD-10-CM | POA: Diagnosis not present

## 2020-04-19 DIAGNOSIS — C3412 Malignant neoplasm of upper lobe, left bronchus or lung: Secondary | ICD-10-CM

## 2020-04-19 DIAGNOSIS — I5033 Acute on chronic diastolic (congestive) heart failure: Secondary | ICD-10-CM | POA: Diagnosis not present

## 2020-04-19 DIAGNOSIS — Z08 Encounter for follow-up examination after completed treatment for malignant neoplasm: Secondary | ICD-10-CM | POA: Diagnosis not present

## 2020-04-19 DIAGNOSIS — I110 Hypertensive heart disease with heart failure: Secondary | ICD-10-CM | POA: Diagnosis not present

## 2020-04-19 DIAGNOSIS — F0150 Vascular dementia without behavioral disturbance: Secondary | ICD-10-CM | POA: Diagnosis not present

## 2020-04-19 DIAGNOSIS — I4901 Ventricular fibrillation: Secondary | ICD-10-CM | POA: Diagnosis not present

## 2020-04-19 DIAGNOSIS — Z86718 Personal history of other venous thrombosis and embolism: Secondary | ICD-10-CM

## 2020-04-19 DIAGNOSIS — L989 Disorder of the skin and subcutaneous tissue, unspecified: Secondary | ICD-10-CM | POA: Diagnosis not present

## 2020-04-19 DIAGNOSIS — I5020 Unspecified systolic (congestive) heart failure: Secondary | ICD-10-CM | POA: Diagnosis not present

## 2020-04-19 DIAGNOSIS — J439 Emphysema, unspecified: Secondary | ICD-10-CM | POA: Diagnosis not present

## 2020-04-19 DIAGNOSIS — R1310 Dysphagia, unspecified: Secondary | ICD-10-CM | POA: Diagnosis not present

## 2020-04-19 DIAGNOSIS — D491 Neoplasm of unspecified behavior of respiratory system: Secondary | ICD-10-CM | POA: Diagnosis not present

## 2020-04-19 NOTE — Telephone Encounter (Signed)
Fax received mdINR PT/INR self testing service Test date/time 04/18/20 625 pm INR 1.6

## 2020-04-19 NOTE — Progress Notes (Signed)
Radiation Oncology         [phone] ________________________________  Name: Travis Cruz MRN: [id number]  Date: 04/19/2020  DOB: 05/08/1946  Outpatient Follow up Visit  CC: Dettinger, Fransisca Kaufmann, MD  Grace Isaac, MD  Diagnosis:  New, putative metachronous stage IA NSCLC of the right upper lobe lung in patient with a history of left upper lobe NSCLC, squamous cell carcinoma, and putative NSCLC of the right lower lung - Clinical stage IA     Interval Since Last Radiation:  6 months s/p SBRT to the RUL  10/10/19, 10/14/19, 10/17/19//SBRT:  The RUL lung target was treated to 54 Gy in 3 fractions of 18 Gy  03/23/2017 to 04/02/2017:  The RLL target was treated to 50 Gy in 5 fractions of 10 Gy    11/07/13-11/16/13:  Left upper lobe / 60 Gy in 5 fractions at 12 Gray per fraction  Narrative:  I spoke with the patient's wife, [name], due to his significant, progressive dementia, to conduct his routine 6 month follow up visit to review results from recent CT Chest scan performed on 04/17/20 via telephone to spare the patient unnecessary potential exposure in the healthcare setting during the current COVID-19 pandemic.  The patient was notified in advance and gave permission to proceed with this visit format.  He has unfortunately been hospitalized in 02/3030 for acute hypoxic respiratory failure secondary to acute exacerbation of COPD. He has been treated with multiple rounds of antibiotics and his inhaler was recently changed which his wife feels has made an improvement in his breathing.  He is followed with [facility] and recently seen by Geraldo Pitter, NP on 04/10/20.  Since his discharge home, he has remained clinically stable.  His recent CT Chest showed an overall stable appearance of the lungs but some interval progression of the consolidative opacity in the posterior right upper lobe felt most likely to be infectious/inflammatory, particularly in light of his recent  pneumonia/COPD exacerbation.  There was no evidence of new or progressive lung lesions or lymphadenopathy.  On review of systems, obtained from wife, [name], due to patient's dementia, the patient states that he is doing well and is currently without complaints.  He specifically denies dysphagia, chest pain, productive cough, hemoptysis, fever, chills, N/V.  He reports that his appetite is gradually returning and he is working on maintaining his weight.  His energy level is low but stable.  He continues on 3-4 L of oxygen via nasal cannula 24/7.  He denies fever, chills or night sweats.  ALLERGIES:  is allergic to ativan [lorazepam].  Meds: Current Outpatient Medications  Medication Sig Dispense Refill  . acetaminophen (TYLENOL) 325 MG tablet Take 2 tablets (650 mg total) by mouth every 6 (six) hours as needed for mild pain, fever or headache (or Fever >/= 101). 12 tablet 9  . atorvastatin (LIPITOR) 80 MG tablet Take 1 tablet (80 mg total) by mouth every Monday, Wednesday, and Friday. 15 tablet 2  . azithromycin (ZITHROMAX) 250 MG tablet Take 1 tablet (250 mg total) by mouth 3 (three) times a week. 12 tablet 5  . Cholecalciferol (VITAMIN D3) 2000 units TABS Take 2,000 Units by mouth daily.     . finasteride (PROSCAR) 5 MG tablet Take 1 tablet (5 mg total) by mouth daily. 90 tablet 3  . Fluticasone-Umeclidin-Vilant (TRELEGY ELLIPTA) 100-62.5-25 MCG/INH AEPB Inhale 1 puff into the lungs daily. 1 each 0  . furosemide (LASIX) 20 MG tablet Take 1 tablet (20 mg  total) by mouth every morning. 30 tablet 2  . metoprolol tartrate (LOPRESSOR) 25 MG tablet Take 1.5 tablets (37.5 mg total) by mouth 2 (two) times daily. 90 tablet 2  . mirabegron ER (MYRBETRIQ) 50 MG TB24 tablet Take 1 tablet (50 mg total) by mouth every evening. 30 tablet 2  . Multiple Vitamin (MULTIVITAMIN WITH MINERALS) TABS tablet Take 1 tablet by mouth daily.    . Multiple Vitamins-Minerals (PRESERVISION AREDS PO) Take 1 capsule by mouth 2  (two) times daily.    . sertraline (ZOLOFT) 50 MG tablet Take 1 tablet (50 mg total) by mouth daily. 30 tablet 2  . tamsulosin (FLOMAX) 0.4 MG CAPS capsule Take 1 capsule (0.4 mg total) by mouth daily after supper. (Needs to be seen before next refill) 30 capsule 3  . tiotropium (SPIRIVA HANDIHALER) 18 MCG inhalation capsule Place 1 capsule (18 mcg total) into inhaler and inhale daily. 30 capsule 4  . warfarin (COUMADIN) 2 MG tablet Take 1 tablet (2 mg total) by mouth See admin instructions. Take 2mg  by mouth every tues, thurs, sat. (Takes 3mg  on all other days) (Patient not taking: Reported on 04/18/2020) 90 tablet 2  . warfarin (COUMADIN) 3 MG tablet Take 1 tablet (3 mg total) by mouth See admin instructions. Takes 3mg  on all days except on Tuesdays Thursdays and Saturdays (Takes 2mg  on Tuesdays, Thursdays, and Saturdays) (Patient not taking: Reported on 04/18/2020) 90 tablet 1   No current facility-administered medications for this encounter.    Physical Findings:  vitals were not taken for this visit.   /Unable to assess due to telephone follow-up visit format.  Lab Findings: Lab Results  Component Value Date   WBC 13.0 (H) 04/06/2020   HGB 15.0 04/06/2020   HCT 46.4 04/06/2020   MCV 88 04/06/2020   PLT 439 04/06/2020     Radiographic Findings: CT Chest W Contrast  Result Date: 04/17/2020 CLINICAL DATA:  Non-small-cell lung cancer.  Restaging. EXAM: CT CHEST WITH CONTRAST TECHNIQUE: Multidetector CT imaging of the chest was performed during intravenous contrast administration. CONTRAST:  68mL OMNIPAQUE IOHEXOL 300 MG/ML  SOLN COMPARISON:  02/27/2020 FINDINGS: Cardiovascular: Heart size upper normal. No substantial pericardial effusion. Coronary artery calcification is evident. Atherosclerotic calcification is noted in the wall of the thoracic aorta. Right-sided permanent pacemaker noted. Mediastinum/Nodes: No mediastinal lymphadenopathy. There is no hilar lymphadenopathy. The esophagus  has normal imaging features. There is no axillary lymphadenopathy. Lungs/Pleura: Centrilobular and paraseptal emphysema evident. Assessment of lung detail markedly degraded by breathing motion. Interval progression of consolidative opacity in the posterior right lung apex. Previously described opacity in the posterior right upper lobe along the major fissure and superior segment right lower lobe is similar to prior. Collapse/consolidative change in the retro hilar right lung is similar. Dependent atelectasis or infiltrate noted right lower lobe. Parahilar scarring left upper lobe is similar to prior given lower lung expansion on today's study. No pleural effusion. Upper Abdomen: Calcified gallstones evident. Thickening of both adrenal glands evident. Musculoskeletal: Similar appearance of compression fractures at T7 and T11. IMPRESSION: Interval progression of consolidative opacity in the posterior right upper lobe. This could be related to infectious/inflammatory etiology or potentially evolution of post radiation change if the area of lung has been included in the port. Otherwise similar appearance of the lungs. Cholelithiasis. Emphysema (ICD10-J43.9) and Aortic Atherosclerosis (ICD10-170.0) Electronically Signed   By: Misty Draycen M.D.   On: 04/17/2020 16:21   CUP PACEART REMOTE DEVICE CHECK  Result Date: 04/02/2020 Scheduled  remote reviewed. Normal device function.  Next remote 91 days. Felisa Bonier, RN, MSN   Impression/Plan: 72. 74 y/o male with recently treated, putative stage IA NSCLC in the right upper lobe lung with a history of left upper lobe NSCLC, squamous cell carcinoma, and putative NSCLC of the right lower lung - Clinical stage IA . He appears to have recovered well from the effects of his recent radiotherapy and is currently without complaints.  His recent CT chest shows overall stable appearance with no evidence of new or progressive lesions or lymphadenopathy.  Therefore, we will resume  serial CT chest scans every 6 months with a follow-up visit or phone call thereafter to review results and recommendations.  They are comfortable with and in agreement with this plan and know to call at any time with any questions or concerns in the interim. I will forward a copy of my note today and recent scan results to Dr. Servando Snare, Dr. Warrick Parisian and Dr. Valeta Harms for their records as well.    Given current concerns for patient exposure during the COVID-19 pandemic, this encounter was conducted via telephone. The patient and his wife, [name], were notified in advance and offered a [place] meeting to allow for face to face communication but unfortunately reported that they did not have the appropriate resources/technology to support such a visit and instead preferred to proceed with telephone follow up. The patient/wife has given verbal consent for this type of encounter. The time spent during this encounter was 15 minutes. The attendants for this meeting include Cristal Howatt PA-C, patient, Leonel Mccollum and his wife, [name]. During the encounter, Tavian Callander PA-C, was located at [facility] Department.  Patient, Atzel Mccambridge and his wife, [name], were located at home.   Nicholos Johns, PA-C    Tyler Pita, MD  [facility] Direct Dial: [phone]  Fax: [phone] [email or web address]  Skype  LinkedIn

## 2020-04-20 DIAGNOSIS — I2510 Atherosclerotic heart disease of native coronary artery without angina pectoris: Secondary | ICD-10-CM | POA: Diagnosis not present

## 2020-04-20 DIAGNOSIS — I252 Old myocardial infarction: Secondary | ICD-10-CM | POA: Diagnosis not present

## 2020-04-20 DIAGNOSIS — J439 Emphysema, unspecified: Secondary | ICD-10-CM | POA: Diagnosis not present

## 2020-04-20 DIAGNOSIS — C3411 Malignant neoplasm of upper lobe, right bronchus or lung: Secondary | ICD-10-CM | POA: Diagnosis not present

## 2020-04-20 DIAGNOSIS — I5020 Unspecified systolic (congestive) heart failure: Secondary | ICD-10-CM | POA: Diagnosis not present

## 2020-04-20 DIAGNOSIS — C3412 Malignant neoplasm of upper lobe, left bronchus or lung: Secondary | ICD-10-CM | POA: Diagnosis not present

## 2020-04-20 DIAGNOSIS — I255 Ischemic cardiomyopathy: Secondary | ICD-10-CM | POA: Diagnosis not present

## 2020-04-20 DIAGNOSIS — I4901 Ventricular fibrillation: Secondary | ICD-10-CM | POA: Diagnosis not present

## 2020-04-20 DIAGNOSIS — I110 Hypertensive heart disease with heart failure: Secondary | ICD-10-CM | POA: Diagnosis not present

## 2020-04-20 DIAGNOSIS — I5033 Acute on chronic diastolic (congestive) heart failure: Secondary | ICD-10-CM | POA: Diagnosis not present

## 2020-04-20 DIAGNOSIS — R1310 Dysphagia, unspecified: Secondary | ICD-10-CM | POA: Diagnosis not present

## 2020-04-20 DIAGNOSIS — F0150 Vascular dementia without behavioral disturbance: Secondary | ICD-10-CM | POA: Diagnosis not present

## 2020-04-20 DIAGNOSIS — L989 Disorder of the skin and subcutaneous tissue, unspecified: Secondary | ICD-10-CM | POA: Diagnosis not present

## 2020-04-20 NOTE — Telephone Encounter (Signed)
Wife aware of dosing instructions.

## 2020-04-20 NOTE — Telephone Encounter (Signed)
Description   continue to take take 2 mg every Tuesday, Thursday and Saturday and then continue on 3 mg every other day of the week  INR 1.6  (goal is 2-3) it is too thick but we had held a 1 day of this past week so that is probably affecting it, go back to his previous dose and we will see what it is next week Recheck in 1 to 2 weeks        Caryl Pina, MD [place] 04/20/2020, 9:11 AM

## 2020-04-24 ENCOUNTER — Encounter: Payer: BC Managed Care – PPO | Admitting: Primary Care

## 2020-04-24 ENCOUNTER — Other Ambulatory Visit: Payer: Self-pay

## 2020-04-24 DIAGNOSIS — C3411 Malignant neoplasm of upper lobe, right bronchus or lung: Secondary | ICD-10-CM | POA: Diagnosis not present

## 2020-04-24 DIAGNOSIS — J439 Emphysema, unspecified: Secondary | ICD-10-CM | POA: Diagnosis not present

## 2020-04-24 DIAGNOSIS — I4901 Ventricular fibrillation: Secondary | ICD-10-CM | POA: Diagnosis not present

## 2020-04-24 DIAGNOSIS — R1310 Dysphagia, unspecified: Secondary | ICD-10-CM | POA: Diagnosis not present

## 2020-04-24 DIAGNOSIS — I5020 Unspecified systolic (congestive) heart failure: Secondary | ICD-10-CM | POA: Diagnosis not present

## 2020-04-24 DIAGNOSIS — L989 Disorder of the skin and subcutaneous tissue, unspecified: Secondary | ICD-10-CM | POA: Diagnosis not present

## 2020-04-24 DIAGNOSIS — C3412 Malignant neoplasm of upper lobe, left bronchus or lung: Secondary | ICD-10-CM | POA: Diagnosis not present

## 2020-04-24 DIAGNOSIS — F0150 Vascular dementia without behavioral disturbance: Secondary | ICD-10-CM | POA: Diagnosis not present

## 2020-04-24 DIAGNOSIS — I252 Old myocardial infarction: Secondary | ICD-10-CM | POA: Diagnosis not present

## 2020-04-24 DIAGNOSIS — I5033 Acute on chronic diastolic (congestive) heart failure: Secondary | ICD-10-CM | POA: Diagnosis not present

## 2020-04-24 DIAGNOSIS — I2510 Atherosclerotic heart disease of native coronary artery without angina pectoris: Secondary | ICD-10-CM | POA: Diagnosis not present

## 2020-04-24 DIAGNOSIS — I255 Ischemic cardiomyopathy: Secondary | ICD-10-CM | POA: Diagnosis not present

## 2020-04-24 DIAGNOSIS — I110 Hypertensive heart disease with heart failure: Secondary | ICD-10-CM | POA: Diagnosis not present

## 2020-04-24 MED ORDER — TRELEGY ELLIPTA 100-62.5-25 MCG/INH IN AEPB: 1.0000 | INHALATION_SPRAY | Freq: Every day | RESPIRATORY_TRACT | 4 refills | Status: DC

## 2020-04-24 NOTE — Progress Notes (Signed)
 Virtual Visit via Telephone Note  I connected with Travis Cruz on 04/24/20 at 10:30 AM EDT by telephone and verified that I am speaking with the correct person using two identifiers.   I discussed the limitations, risks, security and privacy concerns of performing an evaluation and management service by telephone and the availability of in person appointments. I also discussed with the patient that there may be a patient responsible charge related to this service. The patient expressed understanding and agreed to proceed.   History of Present Illness:  74 year old male, former smoker.  Past medical history significant for congestive heart failure, hypertension, NSTEMI, stroke, coronary disease, PE on Coumadin , vascular dementia, respiratory failure with hypoxemia, malignant neoplasm right upper lobe lung, COPD, obstructive sleep apnea.  Former patient of Dr. Darlean last seen in office in 2014.  Recent hospital admission in March 2021 for acute hypoxic respiratory failure secondary to COPD.  Patient underwent SBRT to his stage I lung cancer in 2014.  Patient had pulmonary function test in 2014 which revealed ratio of 62 and FEV1 of 1.96 L (66% predicted) consistent with stage II moderate obstructive lung disease.  He has DLCO 52% consistent with his CT imaging of emphysema.  Patient has not had any PFTs since. He quit smoking in 2009.  Hospital course 02/27/2020-03/09/2020: Presented to emergency room with productive cough over the last several days.  Chest x-ray showed multifocal infiltrate on right side.  CT of chest negative for PE but showed advanced emphysema..  Started on empiric antibiotics.  Patient had a fall overnight, CT of head was negative.  Consulted by pulmonary inpatient who recommended steroids and bronchodilators.  Is also started on chronic azithromycin  Monday Wednesday Friday.  Swallow recommended dysphagia #3 diet.  Discharged on prednisone  40 mg daily reduce by 10 mg every 5  days.  Supplemental oxygen  3 to 4 L nasal cannula. From the hospital he was sent [facility] in [place] from March 12th - 31st.   04/10/2020 Patient presents today for hospital follow-up and to reestablish with pulmonary [place] outpatient office. Wifes states that they were not sent home from rehab on MWF azithromycin  or budesonide /brovana  nebulizers. Wife feels he is doing better. He is currently using spiriva  handihaler. He has a follow-up CT chest scheduled for next week to follow up on previous right upper lung nodule. He does not have an active cough. He denies shortness of breath. Wife states that his breathing can sometimes appear labored with activity. He wears 3l oxygen . Uses walker at home. No falls since home. His PCP is aware of increased falls over the last 6 months. Home health and PT are coming to the house and working with patient. He wears his CPAP every night. Having some difficulty with mask seal. Teresa Day in [place] manages his sleep apnea. AHI <1/hr. Large amount of air leaks.   04/24/2020 Attempted to contact patient for 2 week follow-up  Imaging: 02/27/20 CT chest- Focal area of ground-glass opacity in the posterior subpleural right upper lobe and superior segment of the right lower lobe appears larger and more diffuse compared to the prior CT. This may represent an area of atelectasis/scarring or infiltrate. Neoplasm is not excluded. Continued close follow-up recommended. 2. Moderate to advanced emphysema.  Cardiac: Echocardiogram showed EF 35 to 40%  ECG 02/28/20- NSR QTc 465   Observations/Objective:   Assessment and Plan:   Follow Up Instructions:    I discussed the assessment and treatment plan with the patient. The patient  was provided an opportunity to ask questions and all were answered. The patient agreed with the plan and demonstrated an understanding of the instructions.   The patient was advised to call back or seek an in-person evaluation if the  symptoms worsen or if the condition fails to improve as anticipated.   Almarie LELON Ferrari, NP

## 2020-04-26 ENCOUNTER — Telehealth: Payer: Self-pay | Admitting: *Deleted

## 2020-04-26 DIAGNOSIS — F0150 Vascular dementia without behavioral disturbance: Secondary | ICD-10-CM | POA: Diagnosis not present

## 2020-04-26 DIAGNOSIS — I110 Hypertensive heart disease with heart failure: Secondary | ICD-10-CM | POA: Diagnosis not present

## 2020-04-26 DIAGNOSIS — J439 Emphysema, unspecified: Secondary | ICD-10-CM | POA: Diagnosis not present

## 2020-04-26 DIAGNOSIS — G4733 Obstructive sleep apnea (adult) (pediatric): Secondary | ICD-10-CM | POA: Diagnosis not present

## 2020-04-26 DIAGNOSIS — R1310 Dysphagia, unspecified: Secondary | ICD-10-CM | POA: Diagnosis not present

## 2020-04-26 DIAGNOSIS — C3411 Malignant neoplasm of upper lobe, right bronchus or lung: Secondary | ICD-10-CM | POA: Diagnosis not present

## 2020-04-26 DIAGNOSIS — L989 Disorder of the skin and subcutaneous tissue, unspecified: Secondary | ICD-10-CM | POA: Diagnosis not present

## 2020-04-26 DIAGNOSIS — I255 Ischemic cardiomyopathy: Secondary | ICD-10-CM | POA: Diagnosis not present

## 2020-04-26 DIAGNOSIS — I252 Old myocardial infarction: Secondary | ICD-10-CM | POA: Diagnosis not present

## 2020-04-26 DIAGNOSIS — I4901 Ventricular fibrillation: Secondary | ICD-10-CM | POA: Diagnosis not present

## 2020-04-26 DIAGNOSIS — Z86711 Personal history of pulmonary embolism: Secondary | ICD-10-CM

## 2020-04-26 DIAGNOSIS — I5020 Unspecified systolic (congestive) heart failure: Secondary | ICD-10-CM | POA: Diagnosis not present

## 2020-04-26 DIAGNOSIS — Z86718 Personal history of other venous thrombosis and embolism: Secondary | ICD-10-CM

## 2020-04-26 DIAGNOSIS — I5033 Acute on chronic diastolic (congestive) heart failure: Secondary | ICD-10-CM | POA: Diagnosis not present

## 2020-04-26 DIAGNOSIS — C3412 Malignant neoplasm of upper lobe, left bronchus or lung: Secondary | ICD-10-CM | POA: Diagnosis not present

## 2020-04-26 DIAGNOSIS — I2510 Atherosclerotic heart disease of native coronary artery without angina pectoris: Secondary | ICD-10-CM | POA: Diagnosis not present

## 2020-04-26 NOTE — Telephone Encounter (Signed)
Patient aware and verbalized understanding. °

## 2020-04-26 NOTE — Telephone Encounter (Signed)
Description   Increased dose to take take 2 mg every Tuesday and Saturday and then continue on 3 mg every other day of the week  INR 1.7  (goal is 2-3)  Recheck in 1 to 2 weeks       Caryl Pina, MD [facility] 04/26/2020, 2:15 PM

## 2020-04-26 NOTE — Telephone Encounter (Signed)
Fax received mdINR PT/INR self testing service Test date/time 04/26/20 608am INR 1.7

## 2020-04-27 ENCOUNTER — Encounter: Payer: Self-pay | Admitting: Family Medicine

## 2020-04-27 ENCOUNTER — Other Ambulatory Visit: Payer: Self-pay

## 2020-04-27 ENCOUNTER — Ambulatory Visit (INDEPENDENT_AMBULATORY_CARE_PROVIDER_SITE_OTHER): Payer: BC Managed Care – PPO | Admitting: Family Medicine

## 2020-04-27 VITALS — BP 105/68 | HR 74 | Temp 98.2°F | Ht 69.0 in | Wt 208.0 lb

## 2020-04-27 DIAGNOSIS — I255 Ischemic cardiomyopathy: Secondary | ICD-10-CM | POA: Diagnosis not present

## 2020-04-27 DIAGNOSIS — I2510 Atherosclerotic heart disease of native coronary artery without angina pectoris: Secondary | ICD-10-CM | POA: Diagnosis not present

## 2020-04-27 DIAGNOSIS — C3411 Malignant neoplasm of upper lobe, right bronchus or lung: Secondary | ICD-10-CM | POA: Diagnosis not present

## 2020-04-27 DIAGNOSIS — J418 Mixed simple and mucopurulent chronic bronchitis: Secondary | ICD-10-CM

## 2020-04-27 DIAGNOSIS — C3412 Malignant neoplasm of upper lobe, left bronchus or lung: Secondary | ICD-10-CM | POA: Diagnosis not present

## 2020-04-27 DIAGNOSIS — L989 Disorder of the skin and subcutaneous tissue, unspecified: Secondary | ICD-10-CM | POA: Diagnosis not present

## 2020-04-27 DIAGNOSIS — F0391 Unspecified dementia with behavioral disturbance: Secondary | ICD-10-CM | POA: Diagnosis not present

## 2020-04-27 DIAGNOSIS — I5032 Chronic diastolic (congestive) heart failure: Secondary | ICD-10-CM | POA: Diagnosis not present

## 2020-04-27 DIAGNOSIS — I10 Essential (primary) hypertension: Secondary | ICD-10-CM | POA: Diagnosis not present

## 2020-04-27 DIAGNOSIS — F0150 Vascular dementia without behavioral disturbance: Secondary | ICD-10-CM | POA: Diagnosis not present

## 2020-04-27 DIAGNOSIS — R1310 Dysphagia, unspecified: Secondary | ICD-10-CM | POA: Diagnosis not present

## 2020-04-27 DIAGNOSIS — I110 Hypertensive heart disease with heart failure: Secondary | ICD-10-CM | POA: Diagnosis not present

## 2020-04-27 DIAGNOSIS — I5033 Acute on chronic diastolic (congestive) heart failure: Secondary | ICD-10-CM | POA: Diagnosis not present

## 2020-04-27 DIAGNOSIS — I252 Old myocardial infarction: Secondary | ICD-10-CM | POA: Diagnosis not present

## 2020-04-27 DIAGNOSIS — I4901 Ventricular fibrillation: Secondary | ICD-10-CM | POA: Diagnosis not present

## 2020-04-27 DIAGNOSIS — I5020 Unspecified systolic (congestive) heart failure: Secondary | ICD-10-CM | POA: Diagnosis not present

## 2020-04-27 DIAGNOSIS — E785 Hyperlipidemia, unspecified: Secondary | ICD-10-CM | POA: Diagnosis not present

## 2020-04-27 DIAGNOSIS — J439 Emphysema, unspecified: Secondary | ICD-10-CM | POA: Diagnosis not present

## 2020-04-27 DIAGNOSIS — I69911 Memory deficit following unspecified cerebrovascular disease: Secondary | ICD-10-CM

## 2020-04-27 NOTE — Progress Notes (Signed)
BP 105/68   Pulse 74   Temp 98.2 F (36.8 C)   Ht _0  (1.753 m)   Wt 208 lb (94.3 kg)   SpO2 95%   BMI 30.72 kg/m    Subjective:   Patient ID: Travis Cruz, male    DOB: 11-26-1946, 74 y.o.   MRN: [id number]  HPI: Rasheen Bells is a 74 y.o. male presenting on 04/27/2020 for Medical Management of Chronic Issues (39mfollow up)   HPI Hypertension Patient is currently on furosemide and metoprolol, and their blood pressure today is 105/68. Patient denies any lightheadedness or dizziness. Patient denies headaches, blurred vision, chest pains, shortness of breath, or weakness. Denies any side effects from medication and is content with current medication.   Hyperlipidemia Patient is coming in for recheck of his hyperlipidemia. The patient is currently taking atorvastatin. They deny any issues with myalgias or history of liver damage from it. They deny any focal numbness or weakness or chest pain.   Patient has chronic dementia that has been gradually worsening and causing more increasing memory issues a lot of this stems from cerebrovascular disease and issues with the vascular blood flow to the brain.  This is stable currently as far as the blood flow but the memory is gradually worsening.  She is concerned that he is eating less and having some weight loss issues.  He is also developing more progressive weakness and not wanting to move his much.  Patient has chronic COPD and CHF and has chronic respiratory issues and is currently on oxygen all the time at 2 to 3 L.  Patient denies any coughing or congestion.  His breathing is stable but he does take medication for both the CHF and COPD  Relevant past medical, surgical, family and social history reviewed and updated as indicated. Interim medical history since our last visit reviewed. Allergies and medications reviewed and updated.  Review of Systems  Constitutional: Negative for chills and fever.  Eyes: Negative for visual  disturbance.  Respiratory: Positive for shortness of breath (Chronic). Negative for wheezing.   Cardiovascular: Negative for chest pain, palpitations and leg swelling.  Musculoskeletal: Negative for back pain and gait problem.  Skin: Negative for rash.  Psychiatric/Behavioral: Positive for confusion and decreased concentration. Negative for self-injury, sleep disturbance and suicidal ideas.  All other systems reviewed and are negative.   Per HPI unless specifically indicated above   Allergies as of 04/27/2020      Reactions   Ativan [lorazepam] Anxiety, Other (See Comments)   Hyper  [name]      Medication List       Accurate as of April 27, 2020 11:59 PM. If you have any questions, ask your nurse or doctor.        STOP taking these medications   Spiriva HandiHaler 18 MCG inhalation capsule Generic drug: tiotropium Stopped by: JWorthy Rancher MD     TAKE these medications   acetaminophen 325 MG tablet Commonly known as: TYLENOL Take 2 tablets (650 mg total) by mouth every 6 (six) hours as needed for mild pain, fever or headache (or Fever >/= 101).   atorvastatin 80 MG tablet Commonly known as: LIPITOR Take 1 tablet (80 mg total) by mouth every Monday, Wednesday, and Friday.   azithromycin 250 MG tablet Commonly known as: ZITHROMAX Take 1 tablet (250 mg total) by mouth 3 (three) times a week.   finasteride 5 MG tablet Commonly  known as: PROSCAR Take 1 tablet (5 mg total) by mouth daily.   furosemide 20 MG tablet Commonly known as: LASIX Take 1 tablet (20 mg total) by mouth every morning.   metoprolol tartrate 25 MG tablet Commonly known as: LOPRESSOR Take 1.5 tablets (37.5 mg total) by mouth 2 (two) times daily.   mirabegron ER 50 MG Tb24 tablet Commonly known as: Myrbetriq Take 1 tablet (50 mg total) by mouth every evening.   multivitamin with minerals Tabs tablet Take 1 tablet by mouth daily.   PRESERVISION  AREDS PO Take 1 capsule by mouth 2 (two) times daily.   sertraline 50 MG tablet Commonly known as: Zoloft Take 1 tablet (50 mg total) by mouth daily.   tamsulosin 0.4 MG Caps capsule Commonly known as: FLOMAX Take 1 capsule (0.4 mg total) by mouth daily after supper. (Needs to be seen before next refill)   Trelegy Ellipta 100-62.5-25 MCG/INH Aepb Generic drug: Fluticasone-Umeclidin-Vilant Inhale 1 puff into the lungs daily.   Vitamin D3 50 MCG (2000 UT) Tabs Take 2,000 Units by mouth daily.   warfarin 3 MG tablet Commonly known as: COUMADIN Take as directed by the anticoagulation clinic. If you are unsure how to take this medication, talk to your nurse or doctor. Original instructions: Take 1 tablet (3 mg total) by mouth See admin instructions. Takes 48m on all days except on Tuesdays Thursdays and Saturdays (Takes 245mon Tuesdays, Thursdays, and Saturdays)   warfarin 2 MG tablet Commonly known as: COUMADIN Take as directed by the anticoagulation clinic. If you are unsure how to take this medication, talk to your nurse or doctor. Original instructions: Take 1 tablet (2 mg total) by mouth See admin instructions. Take 68m36my mouth every tues, thurs, sat. (Takes 3mg568m all other days)        Objective:   BP 105/68   Pulse 74   Temp 98.2 F (36.8 C)   Ht _0  (1.753 m)   Wt 208 lb (94.3 kg)   SpO2 95%   BMI 30.72 kg/m   Wt Readings from Last 3 Encounters:  04/27/20 208 lb (94.3 kg)  04/10/20 207 lb (93.9 kg)  03/06/20 200 lb 2.8 oz (90.8 kg)    Physical Exam Vitals and nursing note reviewed.  Constitutional:      General: He is not in acute distress.    Appearance: He is well-developed. He is not diaphoretic.  Eyes:     General: No scleral icterus.    Conjunctiva/sclera: Conjunctivae normal.  Neck:     Thyroid: No thyromegaly.  Cardiovascular:     Rate and Rhythm: Normal rate. Rhythm irregular.  Pulmonary:     Effort: Pulmonary effort is normal. No  respiratory distress.     Breath sounds: Normal breath sounds. No wheezing.  Musculoskeletal:        General: Normal range of motion.     Cervical back: Neck supple.  Lymphadenopathy:     Cervical: No cervical adenopathy.  Skin:    General: Skin is warm and dry.     Findings: No rash.  Neurological:     Mental Status: He is alert and oriented to person, place, and time.     Coordination: Coordination normal.  Psychiatric:        Behavior: Behavior normal.        Cognition and Memory: Memory is impaired.       Assessment & Plan:   Problem List Items Addressed This Visit  Cardiovascular and Mediastinum   Chronic diastolic CHF (congestive heart failure) (HCC)   Relevant Orders   CBC with Differential/Platelet (Completed)   Hypertension - Primary   Relevant Orders   CBC with Differential/Platelet (Completed)   CMP14+EGFR (Completed)     Respiratory   Chronic obstructive pulmonary disease (HCC)     Nervous and Auditory   Dementia with behavioral disturbance (HCC)     Other   Hyperlipidemia   Relevant Orders   Lipid panel (Completed)   Memory deficit following unspecified cerebrovascular disease      Patient already has home health but recommended that they try for an aide and PT OT to see if that can help especially since he is increasing unable to perform daily ADLs and get up out of his wheelchair and family is having increasing difficulty with this and bathing and other day-to-day tasks.  He is able to feed himself but he cannot dress himself or take baths or showers or cook or clean. Follow up plan: Return in about 3 months (around 07/27/2020), or if symptoms worsen or fail to improve, for COPD and CHF and dementia.  Counseling provided for all of the vaccine components Orders Placed This Encounter  Procedures  . CBC with Differential/Platelet  . CMP14+EGFR  . Lipid panel    Caryl Pina, MD [facility] 05/02/2020, 10:55  PM

## 2020-04-28 LAB — CBC WITH DIFFERENTIAL/PLATELET
Basophils Absolute: 0.1 10*3/uL (ref 0.0–0.2)
Basos: 0 %
EOS (ABSOLUTE): 0.1 10*3/uL (ref 0.0–0.4)
Eos: 1 %
Hematocrit: 46.6 % (ref 37.5–51.0)
Hemoglobin: 15.2 g/dL (ref 13.0–17.7)
Immature Grans (Abs): 0.1 10*3/uL (ref 0.0–0.1)
Immature Granulocytes: 1 %
Lymphocytes Absolute: 3.2 10*3/uL — ABNORMAL HIGH (ref 0.7–3.1)
Lymphs: 22 %
MCH: 28.6 pg (ref 26.6–33.0)
MCHC: 32.6 g/dL (ref 31.5–35.7)
MCV: 88 fL (ref 79–97)
Monocytes Absolute: 1.7 10*3/uL — ABNORMAL HIGH (ref 0.1–0.9)
Monocytes: 12 %
Neutrophils Absolute: 9 10*3/uL — ABNORMAL HIGH (ref 1.4–7.0)
Neutrophils: 64 %
Platelets: 359 10*3/uL (ref 150–450)
RBC: 5.32 x10E6/uL (ref 4.14–5.80)
RDW: 16 % — ABNORMAL HIGH (ref 11.6–15.4)
WBC: 14.2 10*3/uL — ABNORMAL HIGH (ref 3.4–10.8)

## 2020-04-28 LAB — CMP14+EGFR
ALT: 17 IU/L (ref 0–44)
AST: 26 IU/L (ref 0–40)
Albumin/Globulin Ratio: 1.5 (ref 1.2–2.2)
Albumin: 3.8 g/dL (ref 3.7–4.7)
Alkaline Phosphatase: 162 IU/L — ABNORMAL HIGH (ref 39–117)
BUN/Creatinine Ratio: 16 (ref 10–24)
BUN: 13 mg/dL (ref 8–27)
Bilirubin Total: 0.4 mg/dL (ref 0.0–1.2)
CO2: 26 mmol/L (ref 20–29)
Calcium: 9.5 mg/dL (ref 8.6–10.2)
Chloride: 101 mmol/L (ref 96–106)
Creatinine, Ser: 0.79 mg/dL (ref 0.76–1.27)
GFR calc Af Amer: 104 mL/min/{1.73_m2} (ref >59)
GFR calc non Af Amer: 90 mL/min/{1.73_m2} (ref >59)
Globulin, Total: 2.5 g/dL (ref 1.5–4.5)
Glucose: 89 mg/dL (ref 65–99)
Potassium: 4.6 mmol/L (ref 3.5–5.2)
Sodium: 141 mmol/L (ref 134–144)
Total Protein: 6.3 g/dL (ref 6.0–8.5)

## 2020-04-28 LAB — LIPID PANEL
Chol/HDL Ratio: 2.9 ratio (ref 0.0–5.0)
Cholesterol, Total: 150 mg/dL (ref 100–199)
HDL: 51 mg/dL (ref >39)
LDL Chol Calc (NIH): 81 mg/dL (ref 0–99)
Triglycerides: 95 mg/dL (ref 0–149)
VLDL Cholesterol Cal: 18 mg/dL (ref 5–40)

## 2020-04-30 ENCOUNTER — Ambulatory Visit (INDEPENDENT_AMBULATORY_CARE_PROVIDER_SITE_OTHER): Payer: BC Managed Care – PPO

## 2020-04-30 ENCOUNTER — Other Ambulatory Visit: Payer: Self-pay

## 2020-04-30 DIAGNOSIS — Z86718 Personal history of other venous thrombosis and embolism: Secondary | ICD-10-CM

## 2020-04-30 DIAGNOSIS — C3412 Malignant neoplasm of upper lobe, left bronchus or lung: Secondary | ICD-10-CM

## 2020-04-30 DIAGNOSIS — Z8673 Personal history of transient ischemic attack (TIA), and cerebral infarction without residual deficits: Secondary | ICD-10-CM

## 2020-04-30 DIAGNOSIS — Z8674 Personal history of sudden cardiac arrest: Secondary | ICD-10-CM

## 2020-04-30 DIAGNOSIS — Z923 Personal history of irradiation: Secondary | ICD-10-CM

## 2020-04-30 DIAGNOSIS — L989 Disorder of the skin and subcutaneous tissue, unspecified: Secondary | ICD-10-CM

## 2020-04-30 DIAGNOSIS — I255 Ischemic cardiomyopathy: Secondary | ICD-10-CM

## 2020-04-30 DIAGNOSIS — R1310 Dysphagia, unspecified: Secondary | ICD-10-CM | POA: Diagnosis not present

## 2020-04-30 DIAGNOSIS — I4901 Ventricular fibrillation: Secondary | ICD-10-CM

## 2020-04-30 DIAGNOSIS — I5020 Unspecified systolic (congestive) heart failure: Secondary | ICD-10-CM

## 2020-04-30 DIAGNOSIS — I110 Hypertensive heart disease with heart failure: Secondary | ICD-10-CM | POA: Diagnosis not present

## 2020-04-30 DIAGNOSIS — I2510 Atherosclerotic heart disease of native coronary artery without angina pectoris: Secondary | ICD-10-CM

## 2020-04-30 DIAGNOSIS — Q211 Atrial septal defect: Secondary | ICD-10-CM

## 2020-04-30 DIAGNOSIS — Z87891 Personal history of nicotine dependence: Secondary | ICD-10-CM

## 2020-04-30 DIAGNOSIS — I5033 Acute on chronic diastolic (congestive) heart failure: Secondary | ICD-10-CM

## 2020-04-30 DIAGNOSIS — R569 Unspecified convulsions: Secondary | ICD-10-CM

## 2020-04-30 DIAGNOSIS — C3411 Malignant neoplasm of upper lobe, right bronchus or lung: Secondary | ICD-10-CM | POA: Diagnosis not present

## 2020-04-30 DIAGNOSIS — F0150 Vascular dementia without behavioral disturbance: Secondary | ICD-10-CM

## 2020-04-30 DIAGNOSIS — I252 Old myocardial infarction: Secondary | ICD-10-CM

## 2020-04-30 DIAGNOSIS — Z95810 Presence of automatic (implantable) cardiac defibrillator: Secondary | ICD-10-CM

## 2020-04-30 DIAGNOSIS — Z86711 Personal history of pulmonary embolism: Secondary | ICD-10-CM

## 2020-04-30 DIAGNOSIS — J439 Emphysema, unspecified: Secondary | ICD-10-CM

## 2020-04-30 DIAGNOSIS — Z8701 Personal history of pneumonia (recurrent): Secondary | ICD-10-CM

## 2020-05-03 ENCOUNTER — Telehealth: Payer: Self-pay | Admitting: *Deleted

## 2020-05-03 DIAGNOSIS — Z86718 Personal history of other venous thrombosis and embolism: Secondary | ICD-10-CM

## 2020-05-03 DIAGNOSIS — Z86711 Personal history of pulmonary embolism: Secondary | ICD-10-CM

## 2020-05-03 NOTE — Telephone Encounter (Signed)
Fax received mdINR PT/INR self testing service Test date/time 05/02/20 557pm INR 1.8

## 2020-05-03 NOTE — Telephone Encounter (Signed)
Patient aware and verbalized understanding. °

## 2020-05-03 NOTE — Telephone Encounter (Signed)
Description   Increased dose to take take 2 mg every Saturday only and then continue on 3 mg every other day of the week  INR 1.8  (goal is 2-3)  Recheck in 1 to 2 weeks        Caryl Pina, MD [facility] 05/03/2020, 1:54 PM

## 2020-05-04 DIAGNOSIS — J449 Chronic obstructive pulmonary disease, unspecified: Secondary | ICD-10-CM | POA: Diagnosis not present

## 2020-05-09 DIAGNOSIS — C3412 Malignant neoplasm of upper lobe, left bronchus or lung: Secondary | ICD-10-CM | POA: Diagnosis not present

## 2020-05-09 DIAGNOSIS — I252 Old myocardial infarction: Secondary | ICD-10-CM | POA: Diagnosis not present

## 2020-05-09 DIAGNOSIS — I110 Hypertensive heart disease with heart failure: Secondary | ICD-10-CM | POA: Diagnosis not present

## 2020-05-09 DIAGNOSIS — R1310 Dysphagia, unspecified: Secondary | ICD-10-CM | POA: Diagnosis not present

## 2020-05-09 DIAGNOSIS — F0150 Vascular dementia without behavioral disturbance: Secondary | ICD-10-CM | POA: Diagnosis not present

## 2020-05-09 DIAGNOSIS — L989 Disorder of the skin and subcutaneous tissue, unspecified: Secondary | ICD-10-CM | POA: Diagnosis not present

## 2020-05-09 DIAGNOSIS — I2510 Atherosclerotic heart disease of native coronary artery without angina pectoris: Secondary | ICD-10-CM | POA: Diagnosis not present

## 2020-05-09 DIAGNOSIS — J439 Emphysema, unspecified: Secondary | ICD-10-CM | POA: Diagnosis not present

## 2020-05-09 DIAGNOSIS — I5033 Acute on chronic diastolic (congestive) heart failure: Secondary | ICD-10-CM | POA: Diagnosis not present

## 2020-05-09 DIAGNOSIS — C3411 Malignant neoplasm of upper lobe, right bronchus or lung: Secondary | ICD-10-CM | POA: Diagnosis not present

## 2020-05-09 DIAGNOSIS — I255 Ischemic cardiomyopathy: Secondary | ICD-10-CM | POA: Diagnosis not present

## 2020-05-09 DIAGNOSIS — I4901 Ventricular fibrillation: Secondary | ICD-10-CM | POA: Diagnosis not present

## 2020-05-09 DIAGNOSIS — I5020 Unspecified systolic (congestive) heart failure: Secondary | ICD-10-CM | POA: Diagnosis not present

## 2020-05-10 ENCOUNTER — Telehealth: Payer: Self-pay | Admitting: *Deleted

## 2020-05-10 DIAGNOSIS — Z86718 Personal history of other venous thrombosis and embolism: Secondary | ICD-10-CM

## 2020-05-10 DIAGNOSIS — Z86711 Personal history of pulmonary embolism: Secondary | ICD-10-CM

## 2020-05-10 NOTE — Telephone Encounter (Signed)
Fax received mdINR PT/INR self testing service Test date/time 05/09/20 550 pm INR 2.1

## 2020-05-10 NOTE — Telephone Encounter (Signed)
Description   Continue to take take 2 mg every Saturday only and then continue on 3 mg every other day of the week  INR 2.1  (goal is 2-3)  Recheck in 1 to 2 weeks        Caryl Pina, MD [facility] 05/10/2020, 12:54 PM

## 2020-05-10 NOTE — Telephone Encounter (Signed)
Patient aware and verbalized understanding. °

## 2020-05-15 DIAGNOSIS — I2510 Atherosclerotic heart disease of native coronary artery without angina pectoris: Secondary | ICD-10-CM | POA: Diagnosis not present

## 2020-05-15 DIAGNOSIS — I252 Old myocardial infarction: Secondary | ICD-10-CM | POA: Diagnosis not present

## 2020-05-15 DIAGNOSIS — C3411 Malignant neoplasm of upper lobe, right bronchus or lung: Secondary | ICD-10-CM | POA: Diagnosis not present

## 2020-05-15 DIAGNOSIS — I110 Hypertensive heart disease with heart failure: Secondary | ICD-10-CM | POA: Diagnosis not present

## 2020-05-15 DIAGNOSIS — L989 Disorder of the skin and subcutaneous tissue, unspecified: Secondary | ICD-10-CM | POA: Diagnosis not present

## 2020-05-15 DIAGNOSIS — I5033 Acute on chronic diastolic (congestive) heart failure: Secondary | ICD-10-CM | POA: Diagnosis not present

## 2020-05-15 DIAGNOSIS — F0150 Vascular dementia without behavioral disturbance: Secondary | ICD-10-CM | POA: Diagnosis not present

## 2020-05-15 DIAGNOSIS — C3412 Malignant neoplasm of upper lobe, left bronchus or lung: Secondary | ICD-10-CM | POA: Diagnosis not present

## 2020-05-15 DIAGNOSIS — I4901 Ventricular fibrillation: Secondary | ICD-10-CM | POA: Diagnosis not present

## 2020-05-15 DIAGNOSIS — J439 Emphysema, unspecified: Secondary | ICD-10-CM | POA: Diagnosis not present

## 2020-05-15 DIAGNOSIS — I5020 Unspecified systolic (congestive) heart failure: Secondary | ICD-10-CM | POA: Diagnosis not present

## 2020-05-15 DIAGNOSIS — I255 Ischemic cardiomyopathy: Secondary | ICD-10-CM | POA: Diagnosis not present

## 2020-05-15 DIAGNOSIS — R1310 Dysphagia, unspecified: Secondary | ICD-10-CM | POA: Diagnosis not present

## 2020-05-16 ENCOUNTER — Encounter: Payer: Self-pay | Admitting: Family Medicine

## 2020-05-16 DIAGNOSIS — Z7901 Long term (current) use of anticoagulants: Secondary | ICD-10-CM | POA: Diagnosis not present

## 2020-05-16 DIAGNOSIS — I255 Ischemic cardiomyopathy: Secondary | ICD-10-CM | POA: Diagnosis not present

## 2020-05-16 DIAGNOSIS — I2510 Atherosclerotic heart disease of native coronary artery without angina pectoris: Secondary | ICD-10-CM | POA: Diagnosis not present

## 2020-05-16 DIAGNOSIS — R1310 Dysphagia, unspecified: Secondary | ICD-10-CM | POA: Diagnosis not present

## 2020-05-16 DIAGNOSIS — L989 Disorder of the skin and subcutaneous tissue, unspecified: Secondary | ICD-10-CM | POA: Diagnosis not present

## 2020-05-16 DIAGNOSIS — J439 Emphysema, unspecified: Secondary | ICD-10-CM | POA: Diagnosis not present

## 2020-05-16 DIAGNOSIS — Z86718 Personal history of other venous thrombosis and embolism: Secondary | ICD-10-CM | POA: Diagnosis not present

## 2020-05-16 DIAGNOSIS — F0150 Vascular dementia without behavioral disturbance: Secondary | ICD-10-CM | POA: Diagnosis not present

## 2020-05-16 DIAGNOSIS — I110 Hypertensive heart disease with heart failure: Secondary | ICD-10-CM | POA: Diagnosis not present

## 2020-05-16 DIAGNOSIS — I252 Old myocardial infarction: Secondary | ICD-10-CM | POA: Diagnosis not present

## 2020-05-16 DIAGNOSIS — C3411 Malignant neoplasm of upper lobe, right bronchus or lung: Secondary | ICD-10-CM | POA: Diagnosis not present

## 2020-05-16 DIAGNOSIS — C3412 Malignant neoplasm of upper lobe, left bronchus or lung: Secondary | ICD-10-CM | POA: Diagnosis not present

## 2020-05-16 DIAGNOSIS — I4901 Ventricular fibrillation: Secondary | ICD-10-CM | POA: Diagnosis not present

## 2020-05-16 DIAGNOSIS — I5020 Unspecified systolic (congestive) heart failure: Secondary | ICD-10-CM | POA: Diagnosis not present

## 2020-05-16 DIAGNOSIS — I5033 Acute on chronic diastolic (congestive) heart failure: Secondary | ICD-10-CM | POA: Diagnosis not present

## 2020-05-17 ENCOUNTER — Telehealth: Payer: Self-pay | Admitting: Radiation Oncology

## 2020-05-17 ENCOUNTER — Telehealth: Payer: Self-pay | Admitting: *Deleted

## 2020-05-17 DIAGNOSIS — Z86718 Personal history of other venous thrombosis and embolism: Secondary | ICD-10-CM

## 2020-05-17 DIAGNOSIS — Z86711 Personal history of pulmonary embolism: Secondary | ICD-10-CM

## 2020-05-17 NOTE — Telephone Encounter (Signed)
Description   Take 1 extra 2 mg tablet today and then continue to take take 2 mg every Saturday only and then continue on 3 mg every other day of the week  INR 1.8  (goal is 2-3)  Recheck in 1 to 2 weeks        Caryl Pina, MD [facility] 05/17/2020, 9:06 AM

## 2020-05-17 NOTE — Telephone Encounter (Signed)
Per email request from [name], PA-C I phoned the patient's wife, Travis Cruz, to inquire about what questions she has for [name]. [name] explains that via [place] she has sent several exchanges with someone by the name of [name] but hasn't received the records she requested. She went onto explain that she is attempting to get her husband's medical records since they are needed for him to secure additional assistance. Listened to her frustrations and apologized. Questioned if she had the capabilities to print his medical records from [name] but she denied. Offered to print her husband's radiation records and mail them to her. She agreed and requested records back to when Dr. [name] was seeing her husband. Explained this wouldn't be an issue; I would be glad to include them. [name] denied any additional needs and expressed appreciation for my assistance.   Immediately printed all records, placement them in an envelope and gave them to [employer] to mail.

## 2020-05-17 NOTE — Telephone Encounter (Signed)
Patient aware and verbalized understanding. °

## 2020-05-17 NOTE — Telephone Encounter (Signed)
Fax received mdINR PT/INR self testing service Test date/time 05/16/20 621 pm INR 1.8

## 2020-05-20 ENCOUNTER — Other Ambulatory Visit: Payer: Self-pay | Admitting: Family Medicine

## 2020-05-20 DIAGNOSIS — R609 Edema, unspecified: Secondary | ICD-10-CM

## 2020-05-24 ENCOUNTER — Telehealth: Payer: Self-pay | Admitting: *Deleted

## 2020-05-24 DIAGNOSIS — I252 Old myocardial infarction: Secondary | ICD-10-CM | POA: Diagnosis not present

## 2020-05-24 DIAGNOSIS — I110 Hypertensive heart disease with heart failure: Secondary | ICD-10-CM | POA: Diagnosis not present

## 2020-05-24 DIAGNOSIS — I5020 Unspecified systolic (congestive) heart failure: Secondary | ICD-10-CM | POA: Diagnosis not present

## 2020-05-24 DIAGNOSIS — I4901 Ventricular fibrillation: Secondary | ICD-10-CM | POA: Diagnosis not present

## 2020-05-24 DIAGNOSIS — C3411 Malignant neoplasm of upper lobe, right bronchus or lung: Secondary | ICD-10-CM | POA: Diagnosis not present

## 2020-05-24 DIAGNOSIS — I2510 Atherosclerotic heart disease of native coronary artery without angina pectoris: Secondary | ICD-10-CM | POA: Diagnosis not present

## 2020-05-24 DIAGNOSIS — Z86718 Personal history of other venous thrombosis and embolism: Secondary | ICD-10-CM

## 2020-05-24 DIAGNOSIS — C3412 Malignant neoplasm of upper lobe, left bronchus or lung: Secondary | ICD-10-CM | POA: Diagnosis not present

## 2020-05-24 DIAGNOSIS — F0150 Vascular dementia without behavioral disturbance: Secondary | ICD-10-CM | POA: Diagnosis not present

## 2020-05-24 DIAGNOSIS — I255 Ischemic cardiomyopathy: Secondary | ICD-10-CM | POA: Diagnosis not present

## 2020-05-24 DIAGNOSIS — J439 Emphysema, unspecified: Secondary | ICD-10-CM | POA: Diagnosis not present

## 2020-05-24 DIAGNOSIS — L989 Disorder of the skin and subcutaneous tissue, unspecified: Secondary | ICD-10-CM | POA: Diagnosis not present

## 2020-05-24 DIAGNOSIS — I5033 Acute on chronic diastolic (congestive) heart failure: Secondary | ICD-10-CM | POA: Diagnosis not present

## 2020-05-24 DIAGNOSIS — R1310 Dysphagia, unspecified: Secondary | ICD-10-CM | POA: Diagnosis not present

## 2020-05-24 DIAGNOSIS — Z86711 Personal history of pulmonary embolism: Secondary | ICD-10-CM

## 2020-05-24 NOTE — Telephone Encounter (Signed)
lmtcb

## 2020-05-24 NOTE — Telephone Encounter (Signed)
Wife aware of results.

## 2020-05-24 NOTE — Telephone Encounter (Signed)
Fax received mdINR PT/INR self testing service Test date/time 05/23/20 649 pm INR 2.2

## 2020-05-24 NOTE — Telephone Encounter (Signed)
Description   Continue to take 2 mg every Saturday only and then continue on 3 mg every other day of the week  INR 2.2  (goal is 2-3)  Recheck in 1 to 2 weeks       Caryl Pina, MD [facility] 05/24/2020, 8:22 AM

## 2020-05-30 ENCOUNTER — Encounter: Payer: Self-pay | Admitting: Urology

## 2020-05-31 ENCOUNTER — Telehealth: Payer: Self-pay | Admitting: *Deleted

## 2020-05-31 DIAGNOSIS — Z86711 Personal history of pulmonary embolism: Secondary | ICD-10-CM

## 2020-05-31 DIAGNOSIS — Z86718 Personal history of other venous thrombosis and embolism: Secondary | ICD-10-CM

## 2020-05-31 NOTE — Telephone Encounter (Signed)
Fax received mdINR PT/INR self testing service Test date/time 05/30/20 558pm INR 2.0

## 2020-05-31 NOTE — Telephone Encounter (Signed)
Description   Continue to take 2 mg every Saturday only and then continue on 3 mg every other day of the week  INR 2.0  (goal is 2-3)  Recheck in 1 to 2 weeks       Caryl Pina, MD [facility] 05/31/2020, 3:13 PM

## 2020-05-31 NOTE — Telephone Encounter (Signed)
Patient aware and verbalized understanding. °

## 2020-06-04 DIAGNOSIS — J449 Chronic obstructive pulmonary disease, unspecified: Secondary | ICD-10-CM | POA: Diagnosis not present

## 2020-06-07 ENCOUNTER — Telehealth: Payer: Self-pay | Admitting: *Deleted

## 2020-06-07 DIAGNOSIS — Z86718 Personal history of other venous thrombosis and embolism: Secondary | ICD-10-CM

## 2020-06-07 DIAGNOSIS — Z86711 Personal history of pulmonary embolism: Secondary | ICD-10-CM

## 2020-06-07 NOTE — Telephone Encounter (Signed)
Aware. 

## 2020-06-07 NOTE — Telephone Encounter (Signed)
Description   Continue to take 2 mg every Saturday only and then continue on 3 mg every other day of the week  INR 2.0  (goal is 2-3)  Recheck in 1 to 2 weeks       Caryl Pina, MD [facility] 06/07/2020, 8:27 AM

## 2020-06-07 NOTE — Telephone Encounter (Signed)
Fax received mdINR PT/INR self testing service Test date/time 06/06/20 636 pm INR 2.0

## 2020-06-08 ENCOUNTER — Other Ambulatory Visit (HOSPITAL_COMMUNITY): Payer: BC Managed Care – PPO

## 2020-06-08 ENCOUNTER — Other Ambulatory Visit (HOSPITAL_COMMUNITY)
Admission: RE | Admit: 2020-06-08 | Discharge: 2020-06-08 | Disposition: A | Discharge status: Home / Self Care | Payer: BC Managed Care – PPO | Source: Ambulatory Visit | Attending: Primary Care | Admitting: Primary Care

## 2020-06-08 ENCOUNTER — Other Ambulatory Visit: Payer: Self-pay

## 2020-06-08 ENCOUNTER — Other Ambulatory Visit: Payer: Self-pay | Admitting: *Deleted

## 2020-06-08 DIAGNOSIS — J449 Chronic obstructive pulmonary disease, unspecified: Secondary | ICD-10-CM

## 2020-06-08 DIAGNOSIS — Z01812 Encounter for preprocedural laboratory examination: Secondary | ICD-10-CM | POA: Diagnosis not present

## 2020-06-08 DIAGNOSIS — Z20822 Contact with and (suspected) exposure to covid-19: Secondary | ICD-10-CM | POA: Diagnosis not present

## 2020-06-08 NOTE — Progress Notes (Signed)
or

## 2020-06-09 LAB — SARS CORONAVIRUS 2 (TAT 6-24 HRS): SARS Coronavirus 2: NEGATIVE

## 2020-06-11 ENCOUNTER — Other Ambulatory Visit: Payer: Self-pay

## 2020-06-11 ENCOUNTER — Ambulatory Visit (INDEPENDENT_AMBULATORY_CARE_PROVIDER_SITE_OTHER): Payer: BC Managed Care – PPO | Admitting: Critical Care Medicine

## 2020-06-11 DIAGNOSIS — J449 Chronic obstructive pulmonary disease, unspecified: Secondary | ICD-10-CM

## 2020-06-11 LAB — PULMONARY FUNCTION TEST
FEF 25-75 Pre: 0.67 L/sec
FEF2575-%Pred-Pre: 33 %
FEV1-%Pred-Pre: 47 %
FEV1-Pre: 1.28 L
FEV1FVC-%Pred-Pre: 87 %
FEV6-%Pred-Pre: 57 %
FEV6-Pre: 1.98 L
FEV6FVC-%Pred-Pre: 106 %
FVC-%Pred-Pre: 53 %
FVC-Pre: 2 L
Pre FEV1/FVC ratio: 64 %
Pre FEV6/FVC Ratio: 99 %

## 2020-06-11 NOTE — Progress Notes (Signed)
Spirometry only completed patient unable to process direction for DLCO.

## 2020-06-13 ENCOUNTER — Encounter: Payer: Self-pay | Admitting: Family Medicine

## 2020-06-13 DIAGNOSIS — Z86718 Personal history of other venous thrombosis and embolism: Secondary | ICD-10-CM | POA: Diagnosis not present

## 2020-06-13 DIAGNOSIS — Z7901 Long term (current) use of anticoagulants: Secondary | ICD-10-CM | POA: Diagnosis not present

## 2020-06-13 LAB — PROTIME-INR

## 2020-06-14 ENCOUNTER — Telehealth: Payer: Self-pay

## 2020-06-14 DIAGNOSIS — Z86718 Personal history of other venous thrombosis and embolism: Secondary | ICD-10-CM

## 2020-06-14 DIAGNOSIS — Z86711 Personal history of pulmonary embolism: Secondary | ICD-10-CM

## 2020-06-14 NOTE — Telephone Encounter (Signed)
Aware of dosing of Coumadin

## 2020-06-14 NOTE — Telephone Encounter (Signed)
Description   Continue to take 2 mg every Saturday only and then continue on 3 mg every other day of the week  INR 2.1  (goal is 2-3)  Recheck in 1 to 2 weeks       Caryl Pina, MD [facility] 06/14/2020, 1:26 PM

## 2020-06-14 NOTE — Telephone Encounter (Signed)
Fax received mdINR PT/INR self testing service Test date/time 06-13-2020  INR 2.1

## 2020-06-20 ENCOUNTER — Other Ambulatory Visit: Payer: Self-pay | Admitting: Family Medicine

## 2020-06-20 DIAGNOSIS — R609 Edema, unspecified: Secondary | ICD-10-CM

## 2020-06-21 ENCOUNTER — Telehealth: Payer: Self-pay | Admitting: *Deleted

## 2020-06-21 ENCOUNTER — Encounter: Payer: Self-pay | Admitting: Family Medicine

## 2020-06-21 NOTE — Telephone Encounter (Signed)
Indication: DVT Goal INR: 2-3  Recommendations:  Continue current regimen.  Repeat in 1-2 weeks.

## 2020-06-21 NOTE — Telephone Encounter (Signed)
Fax received mdINR PT/INR self testing service Test date/time 06/20/2020/8:35pm  INR 2.4

## 2020-06-22 ENCOUNTER — Other Ambulatory Visit: Payer: Self-pay | Admitting: Family Medicine

## 2020-06-22 ENCOUNTER — Other Ambulatory Visit: Payer: Self-pay | Admitting: Family

## 2020-06-22 DIAGNOSIS — F01518 Vascular dementia, unspecified severity, with other behavioral disturbance: Secondary | ICD-10-CM

## 2020-06-22 DIAGNOSIS — F0151 Vascular dementia with behavioral disturbance: Secondary | ICD-10-CM

## 2020-06-22 MED ORDER — SERTRALINE HCL 50 MG PO TABS: 50.0000 mg | ORAL_TABLET | Freq: Every day | ORAL | 2 refills | Status: DC

## 2020-06-22 NOTE — Telephone Encounter (Signed)
Appointment scheduled.

## 2020-06-22 NOTE — Telephone Encounter (Signed)
30 day supply given Pt needs to follow up 07/27/20 or around then for 3 month follow up per Dr Warrick Parisian OV notes 03/2020

## 2020-06-22 NOTE — Telephone Encounter (Signed)
Notified to continue current dosing.

## 2020-06-27 LAB — POCT INR: INR: 2.1 (ref 2.0–3.0)

## 2020-06-28 ENCOUNTER — Telehealth: Payer: Self-pay | Admitting: *Deleted

## 2020-06-28 DIAGNOSIS — Z86711 Personal history of pulmonary embolism: Secondary | ICD-10-CM

## 2020-06-28 DIAGNOSIS — Z86718 Personal history of other venous thrombosis and embolism: Secondary | ICD-10-CM

## 2020-06-28 NOTE — Telephone Encounter (Signed)
Description   Continue to take 2 mg every Saturday only and then continue on 3 mg every other day of the week  INR 2.1  (goal is 2-3)  Recheck in 1 to 2 weeks        Caryl Pina, MD [facility] 06/28/2020, 12:41 PM

## 2020-06-28 NOTE — Telephone Encounter (Signed)
Fax received mdINR PT/INR self testing service Test date/time 06/27/20 6:20PM INR 2.1

## 2020-06-28 NOTE — Telephone Encounter (Signed)
Wife aware

## 2020-07-04 ENCOUNTER — Ambulatory Visit (INDEPENDENT_AMBULATORY_CARE_PROVIDER_SITE_OTHER): Payer: BC Managed Care – PPO | Admitting: *Deleted

## 2020-07-04 DIAGNOSIS — J449 Chronic obstructive pulmonary disease, unspecified: Secondary | ICD-10-CM | POA: Diagnosis not present

## 2020-07-04 DIAGNOSIS — I255 Ischemic cardiomyopathy: Secondary | ICD-10-CM

## 2020-07-05 ENCOUNTER — Telehealth: Payer: Self-pay | Admitting: *Deleted

## 2020-07-05 DIAGNOSIS — Z86718 Personal history of other venous thrombosis and embolism: Secondary | ICD-10-CM

## 2020-07-05 DIAGNOSIS — Z86711 Personal history of pulmonary embolism: Secondary | ICD-10-CM

## 2020-07-05 LAB — CUP PACEART REMOTE DEVICE CHECK
Battery Remaining Longevity: 38 mo
Battery Remaining Percentage: 38 %
Battery Voltage: 2.87 V
Brady Statistic RV Percent Paced: 1 %
Date Time Interrogation Session: 20210707020017
HighPow Impedance: 78 Ohm
HighPow Impedance: 78 Ohm
Implantable Lead Implant Date: 20140205
Implantable Lead Location: 753860
Implantable Lead Model: 181
Implantable Lead Serial Number: 323859
Implantable Pulse Generator Implant Date: 20140205
Lead Channel Impedance Value: 410 Ohm
Lead Channel Pacing Threshold Amplitude: 0.75 V
Lead Channel Pacing Threshold Pulse Width: 0.5 ms
Lead Channel Sensing Intrinsic Amplitude: 10.8 mV
Lead Channel Setting Pacing Amplitude: 2.5 V
Lead Channel Setting Pacing Pulse Width: 0.5 ms
Lead Channel Setting Sensing Sensitivity: 0.5 mV
Pulse Gen Serial Number: 7040910

## 2020-07-05 NOTE — Telephone Encounter (Signed)
Aware of instructions.

## 2020-07-05 NOTE — Telephone Encounter (Signed)
Description   Continue to take 2 mg every Saturday only and then continue on 3 mg every other day of the week  INR 1.9  (goal is 2-3), borderline, no change because is running the right on the border. Recheck in 1 to 2 weeks        Caryl Pina, MD [facility] 07/05/2020, 3:04 PM

## 2020-07-05 NOTE — Telephone Encounter (Signed)
Fax received mdINR PT/INR self testing service Test date/time 07/04/20 600 pm  INR 1.9

## 2020-07-06 NOTE — Progress Notes (Signed)
Remote ICD transmission.   

## 2020-07-10 ENCOUNTER — Ambulatory Visit (INDEPENDENT_AMBULATORY_CARE_PROVIDER_SITE_OTHER): Payer: BC Managed Care – PPO | Admitting: Critical Care Medicine

## 2020-07-10 ENCOUNTER — Other Ambulatory Visit: Payer: Self-pay

## 2020-07-10 ENCOUNTER — Encounter: Payer: Self-pay | Admitting: Critical Care Medicine

## 2020-07-10 VITALS — BP 104/60 | HR 70 | Temp 98.2°F | Ht 69.0 in | Wt 220.4 lb

## 2020-07-10 DIAGNOSIS — J449 Chronic obstructive pulmonary disease, unspecified: Secondary | ICD-10-CM | POA: Diagnosis not present

## 2020-07-10 DIAGNOSIS — Z23 Encounter for immunization: Secondary | ICD-10-CM | POA: Diagnosis not present

## 2020-07-10 DIAGNOSIS — J9611 Chronic respiratory failure with hypoxia: Secondary | ICD-10-CM

## 2020-07-10 DIAGNOSIS — Z85118 Personal history of other malignant neoplasm of bronchus and lung: Secondary | ICD-10-CM | POA: Diagnosis not present

## 2020-07-10 MED ORDER — TRELEGY ELLIPTA 100-62.5-25 MCG/INH IN AEPB: 1.0000 | INHALATION_SPRAY | Freq: Every day | RESPIRATORY_TRACT | 11 refills | Status: DC

## 2020-07-10 MED ORDER — ALBUTEROL SULFATE HFA 108 (90 BASE) MCG/ACT IN AERS: 2.0000 | INHALATION_SPRAY | RESPIRATORY_TRACT | 11 refills | Status: AC | PRN
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
## 2020-07-10 NOTE — Patient Instructions (Addendum)
Thank you for visiting Dr. Carlis Abbott at [facility]. We recommend the following:  Keep taking Azithromycin three days per week.  Meds ordered this encounter  Medications  . Fluticasone-Umeclidin-Vilant (TRELEGY ELLIPTA) 100-62.5-25 MCG/INH AEPB    Sig: Inhale 1 puff into the lungs daily.    Dispense:  60 each    Refill:  11    Order Specific Question:   Lot Number?    Answer:   [id number]    Order Specific Question:   Expiration Date?    Answer:   06/28/2021    Order Specific Question:   Manufacturer?    Answer:   [employer] [[id number]]    Order Specific Question:   Quantity    Answer:   1  . albuterol (VENTOLIN HFA) 108 (90 Base) MCG/ACT inhaler    Sig: Inhale 2 puffs into the lungs every 4 (four) hours as needed for wheezing or shortness of breath.    Dispense:  18 g    Refill:  11    Return in about 4 months (around 11/10/2020).    Please do your part to reduce the spread of COVID-19.

## 2020-07-11 ENCOUNTER — Telehealth: Payer: Self-pay | Admitting: Critical Care Medicine

## 2020-07-11 DIAGNOSIS — Z86718 Personal history of other venous thrombosis and embolism: Secondary | ICD-10-CM | POA: Diagnosis not present

## 2020-07-11 DIAGNOSIS — Z7901 Long term (current) use of anticoagulants: Secondary | ICD-10-CM | POA: Diagnosis not present

## 2020-07-11 NOTE — Telephone Encounter (Signed)
I called to discuss [name] CT scan with his wife. Per his wife he has had XRT 3 times for nodules. He has had it twice on the right and once on the left. 3-5 episodes per series.   04/19/20 Rad Onc progress note from PA [place]: -10/10/19, 10/14/19, 10/17/19//SBRT:The RUL lungtarget was treated to54Gy in 68fractions of 18Gy  -03/23/2017 to 04/02/2017:The RLL target was treated to 50Gy in 19fractions of 10Gy  -11/07/13-11/16/13: Left upper lobe / 60 Gy in 5 fractions at 12 Gray per fraction  Has 64-month follow up CT scan (around October 2021). No additional imaging needing currently.  Julian Hy, DO 07/11/20 12:59 PM [facility]

## 2020-07-12 ENCOUNTER — Telehealth: Payer: Self-pay | Admitting: Family Medicine

## 2020-07-12 ENCOUNTER — Telehealth: Payer: Self-pay | Admitting: *Deleted

## 2020-07-12 DIAGNOSIS — Z86711 Personal history of pulmonary embolism: Secondary | ICD-10-CM

## 2020-07-12 DIAGNOSIS — R300 Dysuria: Secondary | ICD-10-CM

## 2020-07-12 DIAGNOSIS — Z86718 Personal history of other venous thrombosis and embolism: Secondary | ICD-10-CM

## 2020-07-12 NOTE — Telephone Encounter (Signed)
Description   INR is too thick again, go back to changing and take 3 mg every day  INR 1.8  (goal is 2-3),  Recheck in 1 to 2 weeks       Caryl Pina, MD [place] 07/12/2020, 12:55 PM

## 2020-07-12 NOTE — Telephone Encounter (Signed)
Go back to changing what?

## 2020-07-12 NOTE — Telephone Encounter (Signed)
Sorry he is going back to taking 3 mg every day

## 2020-07-12 NOTE — Telephone Encounter (Signed)
Fax received mdINR PT/INR self testing service Test date/time 07/11/20 607 pm INR 1.8

## 2020-07-13 ENCOUNTER — Ambulatory Visit (INDEPENDENT_AMBULATORY_CARE_PROVIDER_SITE_OTHER): Payer: BC Managed Care – PPO | Admitting: Family Medicine

## 2020-07-13 DIAGNOSIS — R300 Dysuria: Secondary | ICD-10-CM | POA: Diagnosis not present

## 2020-07-13 DIAGNOSIS — I255 Ischemic cardiomyopathy: Secondary | ICD-10-CM

## 2020-07-13 NOTE — Telephone Encounter (Signed)
Pt aware.
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
## 2020-07-13 NOTE — Telephone Encounter (Signed)
Wife unsure if patient is having problems with urine or not.  Sometimes tell her it burns when he urinates and others says it does not.  I advised her to come by and pick up a cup for sample and to bring it back.  Put patient in night clinic for a telephone visit.  Lab order done.

## 2020-07-14 LAB — URINALYSIS, COMPLETE
Bilirubin, UA: NEGATIVE
Glucose, UA: NEGATIVE
Ketones, UA: NEGATIVE
Leukocytes,UA: NEGATIVE
Nitrite, UA: NEGATIVE
Protein,UA: NEGATIVE
RBC, UA: NEGATIVE
Specific Gravity, UA: 1.014 (ref 1.005–1.030)
Urobilinogen, Ur: 0.2 mg/dL (ref 0.2–1.0)
pH, UA: 5.5 (ref 5.0–7.5)

## 2020-07-14 LAB — MICROSCOPIC EXAMINATION
Bacteria, UA: NONE SEEN
Casts: NONE SEEN /lpf
Epithelial Cells (non renal): NONE SEEN /hpf (ref 0–10)
RBC, Urine: NONE SEEN /hpf (ref 0–2)
WBC, UA: NONE SEEN /hpf (ref 0–5)

## 2020-07-15 LAB — URINE CULTURE: Organism ID, Bacteria: NO GROWTH

## 2020-07-17 ENCOUNTER — Telehealth: Payer: Self-pay | Admitting: Critical Care Medicine

## 2020-07-17 NOTE — Telephone Encounter (Signed)
Called patient and made aware that Dr. Carlis Abbott is working in the hospital but would get back to asap. Will close this message. Will follow up in email message.

## 2020-07-17 NOTE — Telephone Encounter (Signed)
Dr. Carlis Abbott, please advise on pt's mychart message as pt's wife has messaged back.

## 2020-07-18 LAB — POCT INR: INR: 2.2 (ref 2.0–3.0)

## 2020-07-19 ENCOUNTER — Telehealth: Payer: Self-pay | Admitting: *Deleted

## 2020-07-19 DIAGNOSIS — Z86718 Personal history of other venous thrombosis and embolism: Secondary | ICD-10-CM

## 2020-07-19 DIAGNOSIS — Z86711 Personal history of pulmonary embolism: Secondary | ICD-10-CM

## 2020-07-19 NOTE — Telephone Encounter (Signed)
Patient aware and verbalized understanding. °

## 2020-07-19 NOTE — Telephone Encounter (Signed)
Dr. Carlis Abbott, we have scheduled an appointment for 08/01/2020 (first available). You said one week, can appointment wait until 08/01/2020.

## 2020-07-19 NOTE — Telephone Encounter (Signed)
Fax received [facility]INR PT/INR self testing service Test date/time 07/18/20 6:33PM INR 2.2

## 2020-07-19 NOTE — Telephone Encounter (Signed)
Per dr. Ainsley Spinner message, I scheduled pt for soonest available APP appt-- wasn't until 8/4. Pt feels they need to be seen sooner. Is there any way to be seen sooner-- is there anyway to work them in for sometime next week?

## 2020-07-19 NOTE — Telephone Encounter (Signed)
Description   Continue to take 3 mg every day  INR 2.2  (goal is 2-3),  Recheck in 1 to 2 weeks       Caryl Pina, MD [facility] 07/19/2020, 2:19 PM

## 2020-07-20 ENCOUNTER — Other Ambulatory Visit: Payer: Self-pay

## 2020-07-20 ENCOUNTER — Telehealth: Payer: Self-pay | Admitting: Critical Care Medicine

## 2020-07-20 ENCOUNTER — Emergency Department (HOSPITAL_COMMUNITY)
Admission: EM | Admit: 2020-07-20 | Discharge: 2020-07-21 | Disposition: A | Discharge status: Home / Self Care | Payer: BC Managed Care – PPO | Attending: Emergency Medicine | Admitting: Emergency Medicine

## 2020-07-20 ENCOUNTER — Ambulatory Visit (INDEPENDENT_AMBULATORY_CARE_PROVIDER_SITE_OTHER): Payer: BC Managed Care – PPO

## 2020-07-20 DIAGNOSIS — R05 Cough: Secondary | ICD-10-CM | POA: Diagnosis not present

## 2020-07-20 DIAGNOSIS — J449 Chronic obstructive pulmonary disease, unspecified: Secondary | ICD-10-CM | POA: Insufficient documentation

## 2020-07-20 DIAGNOSIS — I110 Hypertensive heart disease with heart failure: Secondary | ICD-10-CM | POA: Diagnosis not present

## 2020-07-20 DIAGNOSIS — I509 Heart failure, unspecified: Secondary | ICD-10-CM | POA: Insufficient documentation

## 2020-07-20 DIAGNOSIS — Z20822 Contact with and (suspected) exposure to covid-19: Secondary | ICD-10-CM | POA: Insufficient documentation

## 2020-07-20 DIAGNOSIS — Z87891 Personal history of nicotine dependence: Secondary | ICD-10-CM | POA: Diagnosis not present

## 2020-07-20 DIAGNOSIS — R918 Other nonspecific abnormal finding of lung field: Secondary | ICD-10-CM

## 2020-07-20 DIAGNOSIS — R0602 Shortness of breath: Secondary | ICD-10-CM | POA: Diagnosis not present

## 2020-07-20 LAB — CBC
HCT: 52.6 % — ABNORMAL HIGH (ref 39.0–52.0)
Hemoglobin: 16.3 g/dL (ref 13.0–17.0)
MCH: 27.9 pg (ref 26.0–34.0)
MCHC: 31 g/dL (ref 30.0–36.0)
MCV: 89.9 fL (ref 80.0–100.0)
Platelets: 292 10*3/uL (ref 150–400)
RBC: 5.85 MIL/uL — ABNORMAL HIGH (ref 4.22–5.81)
RDW: 16.4 % — ABNORMAL HIGH (ref 11.5–15.5)
WBC: 9.5 10*3/uL (ref 4.0–10.5)
nRBC: 0 % (ref 0.0–0.2)

## 2020-07-20 LAB — BASIC METABOLIC PANEL
Anion gap: 10 (ref 5–15)
BUN: 20 mg/dL (ref 8–23)
CO2: 28 mmol/L (ref 22–32)
Calcium: 9.5 mg/dL (ref 8.9–10.3)
Chloride: 102 mmol/L (ref 98–111)
Creatinine, Ser: 0.77 mg/dL (ref 0.61–1.24)
GFR calc Af Amer: >60 mL/min (ref >60)
GFR calc non Af Amer: >60 mL/min (ref >60)
Glucose, Bld: 137 mg/dL — ABNORMAL HIGH (ref 70–99)
Potassium: 4.5 mmol/L (ref 3.5–5.1)
Sodium: 140 mmol/L (ref 135–145)

## 2020-07-20 LAB — PROTIME-INR
INR: 2 — ABNORMAL HIGH (ref 0.8–1.2)
Prothrombin Time: 21.5 seconds — ABNORMAL HIGH (ref 11.4–15.2)

## 2020-07-20 MED ORDER — SODIUM CHLORIDE 0.9% FLUSH: 3.0000 mL | Freq: Once | INTRAVENOUS | Status: DC

## 2020-07-20 NOTE — Telephone Encounter (Signed)
Patient wife states that patient breathing is getting worse and he is coughing more. He has had to use his rescue inhaler multiple times and day. She is worried about waiting till 08/01/20 for an appointment. She would like to know if there is any medication we can send in for him.  I am going to have to trust the patient's wife on this one and if she thinks he should be seen sooner then he can also be seen by PCP or urgent care where they can get a CXR. I unfortunately will not be in clinic any before that time.    I told the wife what Dr. Carlis Abbott stated above and she said that the patient has dementia and can not sit in a ED or urgent care for long periods at a time.  She will call pcp but would like Tammy to advise if there is anything we can do.  TP please advise

## 2020-07-20 NOTE — Telephone Encounter (Signed)
Left message for [name] to call back.

## 2020-07-20 NOTE — Telephone Encounter (Signed)
Patient was recently seen for initial pulmonary consult July 10, 2020 at that particular visit note appeared to be stable COPD.  Please asked patient or wife is he having 6 symptoms such as cough congestion.  I am glad for to order an x-ray.  To evaluate.  But if he is sick would recommend an office visit for evaluation   There are multiple I person visit on Monday 7/26 for visit  If unable to wait this long will need to go to ER /Urgent care   Please contact office for sooner follow up if symptoms do not improve or worsen or seek emergency care

## 2020-07-20 NOTE — Telephone Encounter (Signed)
Looked at the schedule for Monday, there are no in person visits. She is willing to bring him in for an xray this afternoon. Order has been placed. Nothing further needed.

## 2020-07-20 NOTE — ED Triage Notes (Signed)
Pt arrives pov with reports of increasing shob and increased coughing. Family reports pt has had to use his rescue inhaler every day this week. Had a CXR today that showed atypical infection vs asymmetric interstitial pulmonary edema. Pulmonologist called after getting result and told them to come to the ED to be seen. Pt in NAD presently. Wears 3L [place] at baseline.

## 2020-07-20 NOTE — Telephone Encounter (Signed)
Wife called back the pcp has nothing available either. Wife would like to know if we can just placed order for CXR and treat him that way.    TP please advise

## 2020-07-21 ENCOUNTER — Emergency Department (HOSPITAL_COMMUNITY): Payer: BC Managed Care – PPO

## 2020-07-21 DIAGNOSIS — R0602 Shortness of breath: Secondary | ICD-10-CM | POA: Diagnosis not present

## 2020-07-21 LAB — BRAIN NATRIURETIC PEPTIDE: B Natriuretic Peptide: 59.3 pg/mL (ref 0.0–100.0)

## 2020-07-21 LAB — SARS CORONAVIRUS 2 BY RT PCR (HOSPITAL ORDER, PERFORMED IN ~~LOC~~ HOSPITAL LAB): SARS Coronavirus 2: NEGATIVE

## 2020-07-21 MED ORDER — DOXYCYCLINE HYCLATE 100 MG PO TABS
100.0000 mg | ORAL_TABLET | Freq: Once | ORAL | Status: AC
  Administered 2020-07-21: 100 mg via ORAL
  Filled 2020-07-21: qty 1

## 2020-07-21 MED ORDER — PREDNISONE 20 MG PO TABS
40.0000 mg | ORAL_TABLET | Freq: Every day | ORAL | 0 refills | Status: DC
Start: 2020-07-21 — End: 2020-07-25

## 2020-07-21 MED ORDER — METHYLPREDNISOLONE SODIUM SUCC 125 MG IJ SOLR
125.0000 mg | Freq: Once | INTRAMUSCULAR | Status: AC
  Administered 2020-07-21: 125 mg via INTRAVENOUS
  Filled 2020-07-21: qty 2

## 2020-07-21 MED ORDER — DOXYCYCLINE HYCLATE 100 MG PO CAPS
100.0000 mg | ORAL_CAPSULE | Freq: Two times a day (BID) | ORAL | 0 refills | Status: DC
Start: 2020-07-21 — End: 2020-08-09

## 2020-07-21 MED ORDER — AEROCHAMBER PLUS FLO-VU LARGE MISC: 1.0000 | Freq: Once | Status: DC

## 2020-07-21 MED ORDER — IOHEXOL 350 MG/ML SOLN
70.0000 mL | Freq: Once | INTRAVENOUS | Status: AC | PRN
  Administered 2020-07-21: 70 mL via INTRAVENOUS

## 2020-07-21 MED ORDER — ALBUTEROL SULFATE HFA 108 (90 BASE) MCG/ACT IN AERS
6.0000 | INHALATION_SPRAY | Freq: Once | RESPIRATORY_TRACT | Status: AC
  Administered 2020-07-21: 6 via RESPIRATORY_TRACT
  Filled 2020-07-21: qty 6.7

## 2020-07-21 MED ORDER — IPRATROPIUM BROMIDE HFA 17 MCG/ACT IN AERS
2.0000 | INHALATION_SPRAY | Freq: Once | RESPIRATORY_TRACT | Status: AC
  Administered 2020-07-21: 2 via RESPIRATORY_TRACT
  Filled 2020-07-21: qty 12.9

## 2020-07-21 NOTE — ED Provider Notes (Signed)
I received this patient in signout from Dr. Leonette Monarch.  Briefly, he had presented with worsening shortness of breath and some cough despite compliance with home medications.  Possible COPD but does also have history of CHF.  At time of signout, awaiting CTA of chest for further evaluation.  CTA negative for PE.  He does have an area of left lower lobe with consolidation versus atelectasis, infectious versus inflammatory.  Because of his pulmonary complaints, will start on doxycycline to cover CAP as well as continue steroid burst for COPD. BNP normal and no evidence of volume overload on chest CT. I have messaged his pulmonologist regarding follow up.   Pt able to ambulate with walker on home oxygen level without significant desaturation, remained in low to mid 90s.  I discussed plan with patient and his wife who prefer discharge.  Patient discharged in satisfactory condition.   Joscelin Fray, Wenda [place], MD 07/21/20 [phone]

## 2020-07-21 NOTE — ED Notes (Signed)
Patient ambulated in hall using walker. Sp02 remained 91-94% while walking and at rest. Patient assisted back to bed after activity. NAD.
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
## 2020-07-23 DIAGNOSIS — J449 Chronic obstructive pulmonary disease, unspecified: Secondary | ICD-10-CM

## 2020-07-23 NOTE — Telephone Encounter (Signed)
Dr. Carlis Abbott Just an FYI being sent to you from patients wife:  Dr Carlis Abbott I am extremely upset and disappointed with the way your office handled Travis Cruz's care.  You had recently seen him in your office on 07-10-20 and told us to contact you if he used his inhaler 2 days in a row. He started coughing on Sunday 07-18 and I sent you a message on Monday 07-19 giving you a heads up about his condition.  A nurse replied and said the message had been forwarded to you and they would let me know what you said.  On Tuesday 07-20 I still had no reply so I called your office. They apologized and said you had been working in the hospital and that someone would definitely call or contact me on Wednesday, 07-21. THAT DID NOT HAPPEN! I called the office again on Thursday 7-22 and they apologized again and said you said he needed an acute appointment. The first available was Aug 4. I told them that was too far out and they said I needed to get on the books and then it would be easier to move me up, also to call them back if I had not heard anything by noon the next day (Friday 07-23). So I called on Friday. This time they said I needed to see if his primary care could see him.  I tried that and they were all booked up. Then I was told to go to urgent care or ER.  I finally convinced them to do an x-ray, hoping since you had recently seen him, you might do a tele-visit or call in more medication.  But, no, after the x-ray I was told to take him to the ER.  This resulted in a 16 1/2 hour ordeal, 10 1/2 of which was in the waiting room ALL NIGHT! This ER ordeal was physically and mentally stressful for Travis Cruz and myself.  Not to mention how it put him at more risk for germs and sickness.  I feel this all could have been avoided if your office had provided prompt instruction and treatment.  Travis Cruz

## 2020-07-23 NOTE — Telephone Encounter (Signed)
Sarah, Since Dr. Carlis Abbott is out of the office this week on vacation, could you address the patient/wife's question about being referred to the health at home program.  Please advise.  Thank you.

## 2020-07-23 NOTE — Telephone Encounter (Signed)
[name] was seen in the ER on 07/24-07/25/21 by Fatima Blank, MD.  He suggested to contact the pulmonologist office, which would be Dr. Carlis Abbott and get him enrolled in the [facility] [place].  This is notification of what the ER doctor's instruction were to me.     Dr. Carlis Abbott, Please advise.  Thank you.

## 2020-07-24 ENCOUNTER — Ambulatory Visit: Payer: BC Managed Care – PPO | Admitting: Adult Health

## 2020-07-25 ENCOUNTER — Other Ambulatory Visit: Payer: Self-pay

## 2020-07-25 ENCOUNTER — Encounter: Payer: Self-pay | Admitting: Family Medicine

## 2020-07-25 ENCOUNTER — Ambulatory Visit (INDEPENDENT_AMBULATORY_CARE_PROVIDER_SITE_OTHER): Payer: BC Managed Care – PPO | Admitting: Family Medicine

## 2020-07-25 VITALS — BP 116/73 | HR 84 | Temp 99.3°F

## 2020-07-25 DIAGNOSIS — J189 Pneumonia, unspecified organism: Secondary | ICD-10-CM
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
## 2020-07-26 ENCOUNTER — Telehealth: Payer: Self-pay

## 2020-07-26 NOTE — Telephone Encounter (Signed)
Faxed received  mdinr PT/INR self testing service  07/25/2020 at 10:46pm  Result: 3.1  Prescribed range between 2-3  Notification range below 1.5 above 4.9

## 2020-07-26 NOTE — Telephone Encounter (Signed)
Continue coumadin as is. 3.1 isn't far enough out of range for concern. Needs to check next week, though

## 2020-07-26 NOTE — Telephone Encounter (Signed)
lmtcb

## 2020-07-26 NOTE — Telephone Encounter (Signed)
Aware to continue current coumadin dose

## 2020-07-27 DIAGNOSIS — G4733 Obstructive sleep apnea (adult) (pediatric): Secondary | ICD-10-CM | POA: Diagnosis not present

## 2020-08-01 ENCOUNTER — Ambulatory Visit: Payer: BC Managed Care – PPO | Admitting: Adult Health

## 2020-08-01 LAB — POCT INR: INR: 2.8 (ref 2.0–3.0)

## 2020-08-02 ENCOUNTER — Telehealth: Payer: Self-pay | Admitting: *Deleted

## 2020-08-02 DIAGNOSIS — Z86718 Personal history of other venous thrombosis and embolism: Secondary | ICD-10-CM

## 2020-08-02 DIAGNOSIS — Z86711 Personal history of pulmonary embolism: Secondary | ICD-10-CM

## 2020-08-02 NOTE — Telephone Encounter (Signed)
Fax received mdINR PT/INR self testing service Test date/time 08/01/20 6:05pm INR 2.8

## 2020-08-02 NOTE — Telephone Encounter (Signed)
Patients wife aware

## 2020-08-02 NOTE — Telephone Encounter (Signed)
Description   Continue to take 3 mg every day  INR 2.8 (goal is 2-3),  Recheck in 1 to 2 weeks       Caryl Pina, MD [facility] 08/02/2020, 1:10 PM

## 2020-08-04 DIAGNOSIS — J449 Chronic obstructive pulmonary disease, unspecified: Secondary | ICD-10-CM | POA: Diagnosis not present

## 2020-08-06 ENCOUNTER — Encounter: Payer: Self-pay | Admitting: Family Medicine

## 2020-08-06 MED ORDER — METOPROLOL TARTRATE 25 MG PO TABS: 37.5000 mg | ORAL_TABLET | Freq: Two times a day (BID) | ORAL | 2 refills | Status: DC

## 2020-08-06 NOTE — Telephone Encounter (Signed)
PCC's I see notes about the DME not doing this program- is there somewhere else we could send this order to?

## 2020-08-07 NOTE — Telephone Encounter (Signed)
Thanks for helping with this Nira Conn!

## 2020-08-07 NOTE — Telephone Encounter (Signed)
I called [facility] and spoke with Lenna Sciara, she stated that [facility] does not do the [facility], but [place] does.  She did not have the number when I called, but stated that the [facility] should have it and can send it as a pulmonary rehab referral.  PCC's, please contact [facility] for the Health at Home program (pulmonary rehab referral).  Thank you.

## 2020-08-07 NOTE — Telephone Encounter (Signed)
You are very welcome.  Glad to help.

## 2020-08-09 ENCOUNTER — Other Ambulatory Visit: Payer: Self-pay

## 2020-08-09 ENCOUNTER — Encounter: Payer: Self-pay | Admitting: Family Medicine

## 2020-08-09 ENCOUNTER — Ambulatory Visit (INDEPENDENT_AMBULATORY_CARE_PROVIDER_SITE_OTHER): Payer: BC Managed Care – PPO | Admitting: Family Medicine

## 2020-08-09 VITALS — BP 107/69 | HR 94 | Temp 98.2°F | Ht 69.0 in | Wt 218.0 lb

## 2020-08-09 DIAGNOSIS — E782 Mixed hyperlipidemia: Secondary | ICD-10-CM | POA: Diagnosis not present

## 2020-08-09 DIAGNOSIS — Z8673 Personal history of transient ischemic attack (TIA), and cerebral infarction without residual deficits: Secondary | ICD-10-CM | POA: Diagnosis not present

## 2020-08-09 DIAGNOSIS — I2510 Atherosclerotic heart disease of native coronary artery without angina pectoris: Secondary | ICD-10-CM

## 2020-08-09 DIAGNOSIS — Z86718 Personal history of other venous thrombosis and embolism: Secondary | ICD-10-CM

## 2020-08-09 DIAGNOSIS — Z7901 Long term (current) use of anticoagulants: Secondary | ICD-10-CM

## 2020-08-09 DIAGNOSIS — Z86711 Personal history of pulmonary embolism: Secondary | ICD-10-CM | POA: Diagnosis not present

## 2020-08-09 DIAGNOSIS — I255 Ischemic cardiomyopathy: Secondary | ICD-10-CM

## 2020-08-09 DIAGNOSIS — I10 Essential (primary) hypertension: Secondary | ICD-10-CM

## 2020-08-09 DIAGNOSIS — I5032 Chronic diastolic (congestive) heart failure: Secondary | ICD-10-CM

## 2020-08-09 DIAGNOSIS — J449 Chronic obstructive pulmonary disease, unspecified: Secondary | ICD-10-CM

## 2020-08-09 NOTE — Progress Notes (Signed)
BP 107/69   Pulse 94   Temp 98.2 F (36.8 C)   Ht _0  (1.753 m)   Wt 218 lb (98.9 kg)   SpO2 94%   BMI 32.19 kg/m    Subjective:   Patient ID: Travis Cruz, male    DOB: 08-Feb-1946, 74 y.o.   MRN: [id number]  HPI: Cedrik Heindl is a 74 y.o. male presenting on 08/09/2020 for Medical Management of Chronic Issues, Hypertension, and chronic anticoagulation   HPI Hypertension Patient is currently on metoprolol and furosemide, and their blood pressure today is 107/69. Patient denies any lightheadedness or dizziness. Patient denies headaches, blurred vision, chest pains, shortness of breath, or weakness. Denies any side effects from medication and is content with current medication.   Hypothyroidism recheck Patient is coming in for thyroid recheck today as well. They deny any issues with hair changes or heat or cold problems or diarrhea or constipation. They deny any chest pain or palpitations. They are currently on no medication currently  Hyperlipidemia Patient is coming in for recheck of his hyperlipidemia. The patient is currently taking atorvastatin. They deny any issues with myalgias or history of liver damage from it. They deny any focal numbness or weakness or chest pain.   COPD Patient is coming in for COPD recheck today.  He is currently on albuterol and Trelegy.  He has a mild chronic cough but denies any major coughing spells or wheezing spells.  He has 1nighttime symptoms per week and 2daytime symptoms per week currently.  Breathing is stable on 3 L nasal cannula today, was treated for pneumonia couple weeks ago but has been doing well since  Coumadin recheck Target goal: 2.0-3.0, did a home INR today and it was 2.7 Reason on anticoagulation: history of DVTs and PEs Patient denies any bruising or bleeding or chest pain or palpitations   Relevant past medical, surgical, family and social history reviewed and updated as indicated. Interim medical history since  our last visit reviewed. Allergies and medications reviewed and updated.  Review of Systems  Constitutional: Negative for chills and fever.  Respiratory: Negative for shortness of breath and wheezing.   Cardiovascular: Negative for chest pain and leg swelling.  Musculoskeletal: Negative for back pain and gait problem.  Skin: Negative for rash.  Neurological: Negative for dizziness, weakness and numbness.  All other systems reviewed and are negative.   Per HPI unless specifically indicated above   Allergies as of 08/09/2020      Reactions   Ativan [lorazepam] Anxiety, Other (See Comments)   Hyper  [name]      Medication List       Accurate as of August 09, 2020  9:37 AM. If you have any questions, ask your nurse or doctor.        STOP taking these medications   doxycycline 100 MG capsule Commonly known as: VIBRAMYCIN Stopped by: Fransisca Kaufmann Chestine Belknap, MD     TAKE these medications   acetaminophen 325 MG tablet Commonly known as: TYLENOL Take 2 tablets (650 mg total) by mouth every 6 (six) hours as needed for mild pain, fever or headache (or Fever >/= 101).   albuterol 108 (90 Base) MCG/ACT inhaler Commonly known as: VENTOLIN HFA Inhale 2 puffs into the lungs every 4 (four) hours as needed for wheezing or shortness of breath.   atorvastatin 80 MG tablet Commonly known as: LIPITOR Take 1 tablet (80 mg total) by mouth every  Monday, Wednesday, and Friday. What changed: how much to take   azithromycin 250 MG tablet Commonly known as: ZITHROMAX Take 1 tablet (250 mg total) by mouth 3 (three) times a week. What changed: when to take this   finasteride 5 MG tablet Commonly known as: PROSCAR Take 1 tablet (5 mg total) by mouth daily.   furosemide 20 MG tablet Commonly known as: LASIX TAKE 2 TABLETS BY MOUTH EVERY DAY What changed: how much to take   metoprolol tartrate 25 MG tablet Commonly known as: LOPRESSOR Take 1.5 tablets  (37.5 mg total) by mouth 2 (two) times daily.   mirabegron ER 50 MG Tb24 tablet Commonly known as: Myrbetriq Take 1 tablet (50 mg total) by mouth every evening.   multivitamin with minerals Tabs tablet Take 1 tablet by mouth daily.   PRESERVISION AREDS PO Take 1 capsule by mouth 2 (two) times daily.   sertraline 50 MG tablet Commonly known as: ZOLOFT Take 1 tablet (50 mg total) by mouth daily.   tamsulosin 0.4 MG Caps capsule Commonly known as: FLOMAX Take 1 capsule (0.4 mg total) by mouth daily after supper. (Needs to be seen before next refill)   Trelegy Ellipta 100-62.5-25 MCG/INH Aepb Generic drug: Fluticasone-Umeclidin-Vilant Inhale 1 puff into the lungs daily.   Vitamin D3 50 MCG (2000 UT) Tabs Take 2,000 Units by mouth daily.   warfarin 3 MG tablet Commonly known as: COUMADIN Take as directed by the anticoagulation clinic. If you are unsure how to take this medication, talk to your nurse or doctor. Original instructions: Take 1 tablet (3 mg total) by mouth See admin instructions. Takes 74m on all days except on Tuesdays Thursdays and Saturdays (Takes 281mon Tuesdays, Thursdays, and Saturdays) What changed:   when to take this  additional instructions   warfarin 2 MG tablet Commonly known as: COUMADIN Take as directed by the anticoagulation clinic. If you are unsure how to take this medication, talk to your nurse or doctor. Original instructions: Take 1 tablet (2 mg total) by mouth See admin instructions. Take 38m66my mouth every tues, thurs, sat. (Takes 3mg81m all other days) What changed: Another medication with the same name was changed. Make sure you understand how and when to take each.        Objective:   BP 107/69   Pulse 94   Temp 98.2 F (36.8 C)   Ht _0  (1.753 m)   Wt 218 lb (98.9 kg)   SpO2 94%   BMI 32.19 kg/m   Wt Readings from Last 3 Encounters:  08/09/20 218 lb (98.9 kg)  07/10/20 220 lb 6.4 oz (100 kg)  04/27/20 208 lb (94.3 kg)      Physical Exam Vitals and nursing note reviewed.  Constitutional:      General: He is not in acute distress.    Appearance: He is well-developed. He is not diaphoretic.  Eyes:     General: No scleral icterus.    Conjunctiva/sclera: Conjunctivae normal.  Neck:     Thyroid: No thyromegaly.  Cardiovascular:     Rate and Rhythm: Normal rate and regular rhythm.     Heart sounds: Normal heart sounds. No murmur heard.   Pulmonary:     Effort: Pulmonary effort is normal. No respiratory distress.     Breath sounds: Normal breath sounds. No wheezing.  Musculoskeletal:        General: Normal range of motion.     Cervical back: Neck supple.  Lymphadenopathy:  Cervical: No cervical adenopathy.  Skin:    General: Skin is warm and dry.     Findings: No rash.  Neurological:     Mental Status: He is alert and oriented to person, place, and time.     Coordination: Coordination normal.  Psychiatric:        Behavior: Behavior normal.       Assessment & Plan:   Problem List Items Addressed This Visit      Cardiovascular and Mediastinum   CAD (coronary artery disease)   Chronic diastolic CHF (congestive heart failure) (HCC)   Hypertension   Relevant Orders   CMP14+EGFR (Completed)     Respiratory   COPD (chronic obstructive pulmonary disease) (HCC)     Other   Hyperlipidemia   Relevant Orders   Lipid panel (Completed)   History of DVT (deep vein thrombosis)   Relevant Orders   CBC with Differential/Platelet (Completed)   History of pulmonary embolus (PE)   Relevant Orders   CBC with Differential/Platelet (Completed)   History of CVA (cerebrovascular accident)   Relevant Orders   CBC with Differential/Platelet (Completed)    Other Visit Diagnoses    Chronic anticoagulation    -  Primary   Relevant Orders   CBC with Differential/Platelet (Completed)      Description   Continue to take 3 mg every day  INR 2.8 (goal is 2-3),  Recheck in 1 to 2 weeks       No  change in medication, will check blood work, seems to be doing okay and is stable on his current oxygen and current medicines.  Follow up plan: Return in about 3 months (around 11/09/2020), or if symptoms worsen or fail to improve, for COPD and anxiety and memory testing.  Counseling provided for all of the vaccine components Orders Placed This Encounter  Procedures  . CBC with Differential/Platelet  . CMP14+EGFR  . Lipid panel    Caryl Pina, MD [facility] 08/09/2020, 9:37 AM

## 2020-08-09 NOTE — Patient Instructions (Signed)
CAPs [place]  Palliative through [facility] or [facility]

## 2020-08-10 ENCOUNTER — Encounter: Payer: Self-pay | Admitting: Family Medicine

## 2020-08-10 DIAGNOSIS — F01518 Vascular dementia, unspecified severity, with other behavioral disturbance: Secondary | ICD-10-CM

## 2020-08-10 DIAGNOSIS — F0151 Vascular dementia with behavioral disturbance: Secondary | ICD-10-CM

## 2020-08-10 LAB — CBC WITH DIFFERENTIAL/PLATELET
Basophils Absolute: 0 10*3/uL (ref 0.0–0.2)
Basos: 0 %
EOS (ABSOLUTE): 0.3 10*3/uL (ref 0.0–0.4)
Eos: 3 %
Hematocrit: 50.4 % (ref 37.5–51.0)
Hemoglobin: 16.6 g/dL (ref 13.0–17.7)
Immature Grans (Abs): 0.1 10*3/uL (ref 0.0–0.1)
Immature Granulocytes: 1 %
Lymphocytes Absolute: 2.6 10*3/uL (ref 0.7–3.1)
Lymphs: 24 %
MCH: 27.9 pg (ref 26.6–33.0)
MCHC: 32.9 g/dL (ref 31.5–35.7)
MCV: 85 fL (ref 79–97)
Monocytes Absolute: 1.5 10*3/uL — ABNORMAL HIGH (ref 0.1–0.9)
Monocytes: 13 %
Neutrophils Absolute: 6.4 10*3/uL (ref 1.4–7.0)
Neutrophils: 59 %
Platelets: 262 10*3/uL (ref 150–450)
RBC: 5.94 x10E6/uL — ABNORMAL HIGH (ref 4.14–5.80)
RDW: 14.5 % (ref 11.6–15.4)
WBC: 10.9 10*3/uL — ABNORMAL HIGH (ref 3.4–10.8)

## 2020-08-10 LAB — LIPID PANEL
Chol/HDL Ratio: 3.2 ratio (ref 0.0–5.0)
Cholesterol, Total: 189 mg/dL (ref 100–199)
HDL: 59 mg/dL (ref >39)
LDL Chol Calc (NIH): 112 mg/dL — ABNORMAL HIGH (ref 0–99)
Triglycerides: 103 mg/dL (ref 0–149)
VLDL Cholesterol Cal: 18 mg/dL (ref 5–40)

## 2020-08-10 LAB — CMP14+EGFR
ALT: 26 IU/L (ref 0–44)
AST: 27 IU/L (ref 0–40)
Albumin/Globulin Ratio: 1.6 (ref 1.2–2.2)
Albumin: 3.9 g/dL (ref 3.7–4.7)
Alkaline Phosphatase: 147 IU/L — ABNORMAL HIGH (ref 48–121)
BUN/Creatinine Ratio: 17 (ref 10–24)
BUN: 16 mg/dL (ref 8–27)
Bilirubin Total: 0.4 mg/dL (ref 0.0–1.2)
CO2: 27 mmol/L (ref 20–29)
Calcium: 9.7 mg/dL (ref 8.6–10.2)
Chloride: 102 mmol/L (ref 96–106)
Creatinine, Ser: 0.95 mg/dL (ref 0.76–1.27)
GFR calc Af Amer: 92 mL/min/{1.73_m2} (ref >59)
GFR calc non Af Amer: 80 mL/min/{1.73_m2} (ref >59)
Globulin, Total: 2.5 g/dL (ref 1.5–4.5)
Glucose: 99 mg/dL (ref 65–99)
Potassium: 4.6 mmol/L (ref 3.5–5.2)
Sodium: 140 mmol/L (ref 134–144)
Total Protein: 6.4 g/dL (ref 6.0–8.5)

## 2020-08-14 MED ORDER — SERTRALINE HCL 100 MG PO TABS: 100.0000 mg | ORAL_TABLET | Freq: Every day | ORAL | 3 refills | Status: DC

## 2020-08-15 LAB — POCT INR: INR: 2.4 (ref 2.0–3.0)

## 2020-08-16 ENCOUNTER — Telehealth: Payer: Self-pay | Admitting: *Deleted

## 2020-08-16 DIAGNOSIS — Z86718 Personal history of other venous thrombosis and embolism: Secondary | ICD-10-CM

## 2020-08-16 DIAGNOSIS — Z86711 Personal history of pulmonary embolism: Secondary | ICD-10-CM

## 2020-08-16 NOTE — Telephone Encounter (Signed)
Fax received mdINR PT/INR self testing service Test date/time 08/15/20 5:57PM INR 2.4

## 2020-08-16 NOTE — Telephone Encounter (Signed)
Description   Continue to take 3 mg every day  INR 2.4 (goal is 2-3),  Recheck in 1 to 2 weeks       Caryl Pina, MD [facility] 08/16/2020, 3:33 PM

## 2020-08-16 NOTE — Telephone Encounter (Signed)
Patient aware and verbalized understanding. °

## 2020-08-22 LAB — POCT INR: INR: 2.5 (ref 2.0–3.0)

## 2020-08-23 ENCOUNTER — Telehealth: Payer: Self-pay | Admitting: *Deleted

## 2020-08-23 DIAGNOSIS — Z86718 Personal history of other venous thrombosis and embolism: Secondary | ICD-10-CM

## 2020-08-23 DIAGNOSIS — Z86711 Personal history of pulmonary embolism: Secondary | ICD-10-CM

## 2020-08-23 NOTE — Telephone Encounter (Signed)
[name] made aware to continue 3mg  qd and re check in 1-2 weeks.

## 2020-08-23 NOTE — Telephone Encounter (Signed)
Description   Continue to take 3 mg every day  INR 2.5 (goal is 2-3),  Recheck in 1 to 2 weeks       Caryl Pina, MD [facility] 08/23/2020, 4:21 PM

## 2020-08-23 NOTE — Telephone Encounter (Signed)
Fax received mdINR PT/INR self testing service Test date/time 08/22/20 5:38 PM INR 2.5

## 2020-08-30 ENCOUNTER — Telehealth: Payer: Self-pay | Admitting: *Deleted

## 2020-08-30 DIAGNOSIS — Z86711 Personal history of pulmonary embolism: Secondary | ICD-10-CM

## 2020-08-30 DIAGNOSIS — Z86718 Personal history of other venous thrombosis and embolism: Secondary | ICD-10-CM

## 2020-08-30 NOTE — Telephone Encounter (Signed)
Fax received mdINR PT/INR self testing service Test date/time 08/29/20 6:15p INR 2.6

## 2020-08-30 NOTE — Telephone Encounter (Signed)
Patient aware and verbalized understanding. °

## 2020-08-30 NOTE — Telephone Encounter (Signed)
Description   Continue to take 3 mg every day  INR 2.6 (goal is 2-3),  Recheck in 1 to 2 weeks       Caryl Pina, MD [facility] 08/30/2020, 4:15 PM

## 2020-09-03 ENCOUNTER — Encounter: Payer: Self-pay | Admitting: Family Medicine

## 2020-09-03 DIAGNOSIS — E785 Hyperlipidemia, unspecified: Secondary | ICD-10-CM

## 2020-09-04 DIAGNOSIS — J449 Chronic obstructive pulmonary disease, unspecified: Secondary | ICD-10-CM | POA: Diagnosis not present

## 2020-09-04 MED ORDER — ATORVASTATIN CALCIUM 80 MG PO TABS: 80.0000 mg | ORAL_TABLET | ORAL | 2 refills | Status: DC

## 2020-09-05 DIAGNOSIS — Z7901 Long term (current) use of anticoagulants: Secondary | ICD-10-CM | POA: Diagnosis not present

## 2020-09-05 DIAGNOSIS — Z86718 Personal history of other venous thrombosis and embolism: Secondary | ICD-10-CM | POA: Diagnosis not present

## 2020-09-06 ENCOUNTER — Telehealth: Payer: Self-pay | Admitting: *Deleted

## 2020-09-06 DIAGNOSIS — Z86718 Personal history of other venous thrombosis and embolism: Secondary | ICD-10-CM

## 2020-09-06 DIAGNOSIS — Z86711 Personal history of pulmonary embolism: Secondary | ICD-10-CM

## 2020-09-06 NOTE — Telephone Encounter (Signed)
Fax received mdINR PT/INR self testing service Test date/time 09/05/20 614 pm INR 1.7

## 2020-09-06 NOTE — Telephone Encounter (Signed)
Description   Take 2 tablets or 6 mg today and then continue to take 3 mg every day  INR 1.7 (goal is 2-3),  Recheck in 1 to 2 weeks       Caryl Pina, MD [facility] 09/06/2020, 10:41 PM

## 2020-09-07 NOTE — Telephone Encounter (Signed)
Patient aware and verbalized understanding. °

## 2020-09-13 ENCOUNTER — Telehealth: Payer: Self-pay | Admitting: *Deleted

## 2020-09-13 DIAGNOSIS — Z86711 Personal history of pulmonary embolism: Secondary | ICD-10-CM

## 2020-09-13 DIAGNOSIS — Z86718 Personal history of other venous thrombosis and embolism: Secondary | ICD-10-CM

## 2020-09-13 NOTE — Telephone Encounter (Signed)
Description   continue to take 3 mg every day  INR 2.9 (goal is 2-3),  Recheck in 1 to 2 weeks

## 2020-09-13 NOTE — Telephone Encounter (Signed)
Fax received  mdINR PT/INR self testing service Test date/time 09/12/20 9:15 PM  INR 2.9

## 2020-09-14 NOTE — Telephone Encounter (Signed)
Patient aware and verbalized understanding. °

## 2020-09-24 ENCOUNTER — Telehealth: Payer: Self-pay | Admitting: *Deleted

## 2020-09-24 DIAGNOSIS — Z86718 Personal history of other venous thrombosis and embolism: Secondary | ICD-10-CM

## 2020-09-24 DIAGNOSIS — Z86711 Personal history of pulmonary embolism: Secondary | ICD-10-CM

## 2020-09-24 NOTE — Telephone Encounter (Signed)
Description   continue to take 3 mg every day  INR 2.7 (goal is 2-3),  Recheck in 1 to 2 weeks        Caryl Pina, MD [facility] 09/24/2020, 8:29 AM

## 2020-09-24 NOTE — Telephone Encounter (Signed)
Fax received mdINR PT/INR self testing service Test date/time 09/22/20 723 pm INR 2.7

## 2020-09-24 NOTE — Telephone Encounter (Signed)
Wife aware of dosage

## 2020-09-25 ENCOUNTER — Other Ambulatory Visit: Payer: Self-pay | Admitting: Primary Care

## 2020-09-27 ENCOUNTER — Telehealth: Payer: Self-pay | Admitting: *Deleted

## 2020-09-27 DIAGNOSIS — Z86711 Personal history of pulmonary embolism: Secondary | ICD-10-CM

## 2020-09-27 DIAGNOSIS — Z86718 Personal history of other venous thrombosis and embolism: Secondary | ICD-10-CM

## 2020-09-27 NOTE — Telephone Encounter (Signed)
Wife aware and verbalizes understanding.  

## 2020-09-27 NOTE — Telephone Encounter (Signed)
Description   continue to take 3 mg every day  INR 3.0 (goal is 2-3),  Recheck in 1 to 2 weeks       Caryl Pina, MD [facility] 09/27/2020, 11:05 AM

## 2020-09-27 NOTE — Telephone Encounter (Signed)
Fax received mdINR PT/INR self testing service Test date/time 09/26/20 742 pm INR 3.0

## 2020-10-01 ENCOUNTER — Ambulatory Visit (INDEPENDENT_AMBULATORY_CARE_PROVIDER_SITE_OTHER): Payer: BC Managed Care – PPO

## 2020-10-01 DIAGNOSIS — I255 Ischemic cardiomyopathy: Secondary | ICD-10-CM

## 2020-10-03 DIAGNOSIS — Z86718 Personal history of other venous thrombosis and embolism: Secondary | ICD-10-CM | POA: Diagnosis not present

## 2020-10-03 DIAGNOSIS — Z7901 Long term (current) use of anticoagulants: Secondary | ICD-10-CM | POA: Diagnosis not present

## 2020-10-03 LAB — CUP PACEART REMOTE DEVICE CHECK
Battery Remaining Longevity: 37 mo
Battery Remaining Percentage: 36 %
Battery Voltage: 2.87 V
Brady Statistic RV Percent Paced: 1 %
Date Time Interrogation Session: 20211005195412
HighPow Impedance: 82 Ohm
HighPow Impedance: 82 Ohm
Implantable Lead Implant Date: 20140205
Implantable Lead Location: 753860
Implantable Lead Model: 181
Implantable Lead Serial Number: 323859
Implantable Pulse Generator Implant Date: 20140205
Lead Channel Impedance Value: 410 Ohm
Lead Channel Pacing Threshold Amplitude: 0.75 V
Lead Channel Pacing Threshold Pulse Width: 0.5 ms
Lead Channel Sensing Intrinsic Amplitude: 10.5 mV
Lead Channel Setting Pacing Amplitude: 2.5 V
Lead Channel Setting Pacing Pulse Width: 0.5 ms
Lead Channel Setting Sensing Sensitivity: 0.5 mV
Pulse Gen Serial Number: 7040910

## 2020-10-04 ENCOUNTER — Telehealth: Payer: Self-pay | Admitting: *Deleted

## 2020-10-04 DIAGNOSIS — Z86718 Personal history of other venous thrombosis and embolism: Secondary | ICD-10-CM

## 2020-10-04 DIAGNOSIS — Z86711 Personal history of pulmonary embolism: Secondary | ICD-10-CM

## 2020-10-04 DIAGNOSIS — J449 Chronic obstructive pulmonary disease, unspecified: Secondary | ICD-10-CM | POA: Diagnosis not present

## 2020-10-04 NOTE — Progress Notes (Signed)
Remote ICD transmission.   

## 2020-10-04 NOTE — Addendum Note (Signed)
Addended by: Douglass Rivers D on: 10/04/2020 03:08 PM   Modules accepted: Level of Service

## 2020-10-04 NOTE — Telephone Encounter (Signed)
Patient aware and verbalized understanding. °

## 2020-10-04 NOTE — Telephone Encounter (Signed)
Description   continue to take 3 mg every day  INR 2.4 (goal is 2-3),  Recheck in 1 to 2 weeks       Caryl Pina, MD [facility] 10/04/2020, 8:21 AM

## 2020-10-04 NOTE — Telephone Encounter (Signed)
Fax received mdINR PT/INR self testing service Test date/time 10/03/20 618 pm INR 2.4

## 2020-10-11 ENCOUNTER — Telehealth: Payer: Self-pay | Admitting: *Deleted

## 2020-10-11 DIAGNOSIS — Z86711 Personal history of pulmonary embolism: Secondary | ICD-10-CM

## 2020-10-11 DIAGNOSIS — Z86718 Personal history of other venous thrombosis and embolism: Secondary | ICD-10-CM

## 2020-10-11 NOTE — Telephone Encounter (Signed)
Fax received mdINR PT/INR self testing service Test date/time 10/10/20 627 pm INR 2.5

## 2020-10-11 NOTE — Telephone Encounter (Signed)
Patient aware and verbalized understanding. °

## 2020-10-11 NOTE — Telephone Encounter (Signed)
Description   continue to take 3 mg every day  INR 2.5 (goal is 2-3),  Recheck in 1 to 2 weeks       Caryl Pina, MD [facility] 10/11/2020, 1:43 PM

## 2020-10-15 ENCOUNTER — Other Ambulatory Visit: Payer: Self-pay | Admitting: Urology

## 2020-10-18 ENCOUNTER — Telehealth: Payer: Self-pay | Admitting: *Deleted

## 2020-10-18 DIAGNOSIS — Z86711 Personal history of pulmonary embolism: Secondary | ICD-10-CM

## 2020-10-18 DIAGNOSIS — Z86718 Personal history of other venous thrombosis and embolism: Secondary | ICD-10-CM

## 2020-10-18 NOTE — Telephone Encounter (Signed)
Description   continue to take 3 mg every day  INR 2.8 (goal is 2-3),  Recheck in 1 to 2 weeks       Caryl Pina, MD [facility] 10/18/2020, 1:09 PM

## 2020-10-18 NOTE — Telephone Encounter (Signed)
Patient aware and verbalized understanding. °

## 2020-10-18 NOTE — Telephone Encounter (Signed)
Fax received mdINR PT/INR self testing service Test date/time 10/17/20 559 pm INR 2.8

## 2020-10-19 ENCOUNTER — Telehealth: Payer: Self-pay | Admitting: *Deleted

## 2020-10-19 NOTE — Telephone Encounter (Signed)
CALLED PATIENT TO INFORM OF STAT LABS ON 10-24-20 @ 3:15 PM @ [place], [place] TO FOLLOW - ARRIVAL TIME- 4:15 PM, PATIENT TO HAVE WATER ONLY- 4 HRS. PRIOR TO TEST, PATIENT TO RECEIVE RESULTS FROM [facility]ING ON 11-01-20 @ 2 PM VIA TELEPHONE, SPOKE WITH PATIENT'S WIFE [name] AND SHE IS AWARE OF THESE APPTS.

## 2020-10-24 ENCOUNTER — Ambulatory Visit (HOSPITAL_COMMUNITY)
Admission: RE | Admit: 2020-10-24 | Discharge: 2020-10-24 | Disposition: A | Discharge status: Home / Self Care | Payer: BC Managed Care – PPO | Source: Ambulatory Visit | Attending: Urology | Admitting: Urology

## 2020-10-24 ENCOUNTER — Other Ambulatory Visit: Payer: Self-pay | Admitting: Radiation Oncology

## 2020-10-24 ENCOUNTER — Other Ambulatory Visit: Payer: Self-pay

## 2020-10-24 ENCOUNTER — Other Ambulatory Visit (HOSPITAL_COMMUNITY)
Admission: RE | Admit: 2020-10-24 | Discharge: 2020-10-24 | Disposition: A | Discharge status: Home / Self Care | Payer: BC Managed Care – PPO | Source: Ambulatory Visit | Attending: Radiation Oncology | Admitting: Radiation Oncology

## 2020-10-24 ENCOUNTER — Ambulatory Visit: Payer: Self-pay | Admitting: Urology

## 2020-10-24 DIAGNOSIS — C3431 Malignant neoplasm of lower lobe, right bronchus or lung: Secondary | ICD-10-CM

## 2020-10-24 DIAGNOSIS — C3411 Malignant neoplasm of upper lobe, right bronchus or lung: Secondary | ICD-10-CM | POA: Insufficient documentation

## 2020-10-24 DIAGNOSIS — J432 Centrilobular emphysema: Secondary | ICD-10-CM | POA: Diagnosis not present

## 2020-10-24 DIAGNOSIS — C3412 Malignant neoplasm of upper lobe, left bronchus or lung: Secondary | ICD-10-CM | POA: Diagnosis not present

## 2020-10-24 LAB — BUN & CREATININE (CHCC)
BUN: 23 mg/dL (ref 8–23)
Creatinine: 0.84 mg/dL (ref 0.61–1.24)
GFR, Estimated: >60 mL/min (ref >60)

## 2020-10-24 MED ORDER — IOHEXOL 300 MG/ML  SOLN
75.0000 mL | Freq: Once | INTRAMUSCULAR | Status: AC | PRN
  Administered 2020-10-24: 75 mL via INTRAVENOUS

## 2020-10-25 ENCOUNTER — Telehealth (INDEPENDENT_AMBULATORY_CARE_PROVIDER_SITE_OTHER): Payer: BC Managed Care – PPO | Admitting: *Deleted

## 2020-10-25 ENCOUNTER — Other Ambulatory Visit: Payer: Self-pay | Admitting: Family Medicine

## 2020-10-25 ENCOUNTER — Telehealth: Payer: Self-pay

## 2020-10-25 DIAGNOSIS — Z86711 Personal history of pulmonary embolism: Secondary | ICD-10-CM

## 2020-10-25 DIAGNOSIS — R609 Edema, unspecified: Secondary | ICD-10-CM

## 2020-10-25 DIAGNOSIS — Z86718 Personal history of other venous thrombosis and embolism: Secondary | ICD-10-CM

## 2020-10-25 NOTE — Telephone Encounter (Signed)
Called patients wife, states she is not home but to call [place] ([phone]).  Caregiver reports patient was sleeping this morning and suddenly woke up yelling that his chest was hurting. Patient reports of pain eased and has now returned. Patient reports of shortness of breath and palpitations during first episode. Patient reports pain is still present and radiating into both arms. Patient is compliant with medications per caregiver.    Manual transmission received. Presenting VS 70. No shocks or episodes noted. Normal device function.  Advised Veronica patient will need to go to ED for evaluation of symptoms. Agreeable to plan. Advised that patient should not drive. Verbalized understanding. Shock plan reviewed.

## 2020-10-25 NOTE — Telephone Encounter (Signed)
The pt wife states the pt caregiver said he is having chest pains. She asked if his ICD shocked him. I ask them to send a manual transmission with his home monitor. I told her as soon as the nurse see the transmission she will review it and give them a call back.

## 2020-10-25 NOTE — Telephone Encounter (Signed)
Fax received mdINR PT/INR self testing service Test date/time 10/24/20 723 pm INR 1.9

## 2020-10-26 NOTE — Addendum Note (Signed)
Addended by: Caryl Pina on: 10/26/2020 08:00 AM   Modules accepted: Level of Service

## 2020-10-26 NOTE — Telephone Encounter (Signed)
Wife aware and verbalizes understanding per dpr.  °

## 2020-10-26 NOTE — Telephone Encounter (Signed)
Description   continue to take 3 mg every day  INR 1.9 (goal is 2-3),  Recheck in 1 to 2 weeks        No change in medicine, only slightly off on the bottom line and Caryl Pina, MD [facility] 10/26/2020, 7:58 AM

## 2020-10-27 NOTE — Telephone Encounter (Signed)
Noted And agree w advice

## 2020-10-29 ENCOUNTER — Ambulatory Visit (INDEPENDENT_AMBULATORY_CARE_PROVIDER_SITE_OTHER): Payer: BC Managed Care – PPO | Admitting: Family Medicine

## 2020-10-29 ENCOUNTER — Encounter: Payer: Self-pay | Admitting: Family Medicine

## 2020-10-29 ENCOUNTER — Other Ambulatory Visit: Payer: Self-pay

## 2020-10-29 VITALS — BP 109/72 | HR 85 | Temp 98.4°F | Ht 69.0 in | Wt 222.6 lb

## 2020-10-29 DIAGNOSIS — I5032 Chronic diastolic (congestive) heart failure: Secondary | ICD-10-CM

## 2020-10-29 DIAGNOSIS — G4733 Obstructive sleep apnea (adult) (pediatric): Secondary | ICD-10-CM | POA: Diagnosis not present

## 2020-10-29 DIAGNOSIS — I10 Essential (primary) hypertension: Secondary | ICD-10-CM | POA: Diagnosis not present

## 2020-10-29 DIAGNOSIS — Z23 Encounter for immunization: Secondary | ICD-10-CM

## 2020-10-29 DIAGNOSIS — E782 Mixed hyperlipidemia: Secondary | ICD-10-CM

## 2020-10-29 DIAGNOSIS — R609 Edema, unspecified: Secondary | ICD-10-CM

## 2020-10-29 DIAGNOSIS — J449 Chronic obstructive pulmonary disease, unspecified: Secondary | ICD-10-CM | POA: Diagnosis not present

## 2020-10-29 LAB — CBC WITH DIFFERENTIAL/PLATELET
Basophils Absolute: 0.1 10*3/uL (ref 0.0–0.2)
Basos: 0 %
EOS (ABSOLUTE): 0.2 10*3/uL (ref 0.0–0.4)
Eos: 1 %
Hematocrit: 51.4 % — ABNORMAL HIGH (ref 37.5–51.0)
Hemoglobin: 16.8 g/dL (ref 13.0–17.7)
Immature Grans (Abs): 0.1 10*3/uL (ref 0.0–0.1)
Immature Granulocytes: 1 %
Lymphocytes Absolute: 3 10*3/uL (ref 0.7–3.1)
Lymphs: 25 %
MCH: 29 pg (ref 26.6–33.0)
MCHC: 32.7 g/dL (ref 31.5–35.7)
MCV: 89 fL (ref 79–97)
Monocytes Absolute: 1.7 10*3/uL — ABNORMAL HIGH (ref 0.1–0.9)
Monocytes: 14 %
Neutrophils Absolute: 7 10*3/uL (ref 1.4–7.0)
Neutrophils: 59 %
Platelets: 307 10*3/uL (ref 150–450)
RBC: 5.8 x10E6/uL (ref 4.14–5.80)
RDW: 15 % (ref 11.6–15.4)
WBC: 12 10*3/uL — ABNORMAL HIGH (ref 3.4–10.8)

## 2020-10-29 LAB — CMP14+EGFR
ALT: 19 IU/L (ref 0–44)
AST: 26 IU/L (ref 0–40)
Albumin/Globulin Ratio: 1.7 (ref 1.2–2.2)
Albumin: 4.1 g/dL (ref 3.7–4.7)
Alkaline Phosphatase: 147 IU/L — ABNORMAL HIGH (ref 44–121)
BUN/Creatinine Ratio: 18 (ref 10–24)
BUN: 16 mg/dL (ref 8–27)
Bilirubin Total: 0.5 mg/dL (ref 0.0–1.2)
CO2: 28 mmol/L (ref 20–29)
Calcium: 9.8 mg/dL (ref 8.6–10.2)
Chloride: 99 mmol/L (ref 96–106)
Creatinine, Ser: 0.91 mg/dL (ref 0.76–1.27)
GFR calc Af Amer: 97 mL/min/{1.73_m2} (ref >59)
GFR calc non Af Amer: 84 mL/min/{1.73_m2} (ref >59)
Globulin, Total: 2.4 g/dL (ref 1.5–4.5)
Glucose: 90 mg/dL (ref 65–99)
Potassium: 4.6 mmol/L (ref 3.5–5.2)
Sodium: 140 mmol/L (ref 134–144)
Total Protein: 6.5 g/dL (ref 6.0–8.5)

## 2020-10-29 LAB — LIPID PANEL
Chol/HDL Ratio: 3.3 ratio (ref 0.0–5.0)
Cholesterol, Total: 182 mg/dL (ref 100–199)
HDL: 56 mg/dL (ref >39)
LDL Chol Calc (NIH): 104 mg/dL — ABNORMAL HIGH (ref 0–99)
Triglycerides: 127 mg/dL (ref 0–149)
VLDL Cholesterol Cal: 22 mg/dL (ref 5–40)

## 2020-10-29 MED ORDER — FUROSEMIDE 20 MG PO TABS: 40.0000 mg | ORAL_TABLET | Freq: Every day | ORAL | 3 refills | Status: DC

## 2020-10-29 MED ORDER — METOPROLOL TARTRATE 25 MG PO TABS: 37.5000 mg | ORAL_TABLET | Freq: Two times a day (BID) | ORAL | 3 refills | Status: DC

## 2020-10-29 NOTE — Addendum Note (Signed)
Addended by: Baldomero Lamy B on: 10/29/2020 09:49 AM   Modules accepted: Orders

## 2020-10-29 NOTE — Progress Notes (Signed)
BP 109/72   Pulse 85   Temp 98.4 F (36.9 C)   Ht '5\' 9"'  (1.753 m)   Wt 222 lb 9.6 oz (101 kg)   SpO2 96% Comment: 3 liters O2  BMI 32.87 kg/m    Subjective:   Patient ID: Travis Cruz, male    DOB: 09-May-1946, 74 y.o.   MRN: [id number]  HPI: Travis Cruz is a 74 y.o. male presenting on 10/29/2020 for Medical Management of Chronic Issues   HPI COPD and lung cancer Patient is coming in for COPD recheck today.  He is currently on Trelegy and albuterol as needed and oxygen.  He has a mild chronic cough but denies any major coughing spells or wheezing spells.  He has 0 nighttime symptoms per week and 0daytime symptoms per week currently.  Patient is still on oxygen nasal cannula constantly, has with him today.  He has been stable and denies any new issues and has not had to use albuterol at all  Hypertension Patient is currently on MeToprol and furosemide, and their blood pressure today is 109/72. Patient denies any lightheadedness or dizziness. Patient denies headaches, blurred vision, chest pains, shortness of breath, or weakness. Denies any side effects from medication and is content with current medication.   Hyperlipidemia Patient is coming in for recheck of his hyperlipidemia. The patient is currently taking atorvastatin. They deny any issues with myalgias or history of liver damage from it. They deny any focal numbness or weakness or chest pain.   Patient also has CHF managed by cardiology as well and has a pacemaker in place.  Patient does home INR checks.  Relevant past medical, surgical, family and social history reviewed and updated as indicated. Interim medical history since our last visit reviewed. Allergies and medications reviewed and updated.  Review of Systems  Constitutional: Negative for chills and fever.  Eyes: Negative for visual disturbance.  Respiratory: Positive for shortness of breath. Negative for cough and chest tightness.   Cardiovascular:  Negative for chest pain and leg swelling.  Musculoskeletal: Negative for back pain and gait problem.  Skin: Negative for rash.  Neurological: Negative for dizziness, weakness and light-headedness.  All other systems reviewed and are negative.   Per HPI unless specifically indicated above   Allergies as of 10/29/2020      Reactions   Ativan [lorazepam] Anxiety, Other (See Comments)   Hyper  [name]      Medication List       Accurate as of October 29, 2020  8:10 AM. If you have any questions, ask your nurse or doctor.        acetaminophen 325 MG tablet Commonly known as: TYLENOL Take 2 tablets (650 mg total) by mouth every 6 (six) hours as needed for mild pain, fever or headache (or Fever >/= 101).   albuterol 108 (90 Base) MCG/ACT inhaler Commonly known as: VENTOLIN HFA Inhale 2 puffs into the lungs every 4 (four) hours as needed for wheezing or shortness of breath.   atorvastatin 80 MG tablet Commonly known as: LIPITOR Take 1 tablet (80 mg total) by mouth every Monday, Wednesday, and Friday.   azithromycin 250 MG tablet Commonly known as: ZITHROMAX Take 1 tablet (250 mg total) by mouth every Monday, Wednesday, and Friday.   finasteride 5 MG tablet Commonly known as: PROSCAR Take 1 tablet (5 mg total) by mouth daily.   furosemide 20 MG tablet Commonly known as: LASIX TAKE  2 TABLETS BY MOUTH EVERY DAY   metoprolol tartrate 25 MG tablet Commonly known as: LOPRESSOR Take 1.5 tablets (37.5 mg total) by mouth 2 (two) times daily.   multivitamin with minerals Tabs tablet Take 1 tablet by mouth daily.   Myrbetriq 50 MG Tb24 tablet Generic drug: mirabegron ER TAKE 1 TABLET BY MOUTH EVERY DAY   PRESERVISION AREDS PO Take 1 capsule by mouth 2 (two) times daily.   sertraline 100 MG tablet Commonly known as: ZOLOFT Take 1 tablet (100 mg total) by mouth daily.   tamsulosin 0.4 MG Caps capsule Commonly known as: FLOMAX Take 1  capsule (0.4 mg total) by mouth daily after supper. (Needs to be seen before next refill)   Trelegy Ellipta 100-62.5-25 MCG/INH Aepb Generic drug: Fluticasone-Umeclidin-Vilant Inhale 1 puff into the lungs daily.   Vitamin D3 50 MCG (2000 UT) Tabs Take 2,000 Units by mouth daily.   warfarin 3 MG tablet Commonly known as: COUMADIN Take as directed by the anticoagulation clinic. If you are unsure how to take this medication, talk to your nurse or doctor. Original instructions: Take 1 tablet (3 mg total) by mouth See admin instructions. Takes 73m on all days except on Tuesdays Thursdays and Saturdays (Takes 223mon Tuesdays, Thursdays, and Saturdays) What changed:   when to take this  additional instructions   warfarin 2 MG tablet Commonly known as: COUMADIN Take as directed by the anticoagulation clinic. If you are unsure how to take this medication, talk to your nurse or doctor. Original instructions: Take 1 tablet (2 mg total) by mouth See admin instructions. Take 53m63my mouth every tues, thurs, sat. (Takes 3mg8m all other days) What changed: Another medication with the same name was changed. Make sure you understand how and when to take each.        Objective:   BP 109/72   Pulse 85   Temp 98.4 F (36.9 C)   Ht '5\' 9"'  (1.753 m)   Wt 222 lb 9.6 oz (101 kg)   SpO2 96% Comment: 3 liters O2  BMI 32.87 kg/m   Wt Readings from Last 3 Encounters:  10/29/20 222 lb 9.6 oz (101 kg)  08/09/20 218 lb (98.9 kg)  07/10/20 220 lb 6.4 oz (100 kg)    Physical Exam Vitals and nursing note reviewed.  Constitutional:      General: He is not in acute distress.    Appearance: He is well-developed. He is not diaphoretic.  Eyes:     General: No scleral icterus.    Conjunctiva/sclera: Conjunctivae normal.  Neck:     Thyroid: No thyromegaly.  Cardiovascular:     Rate and Rhythm: Normal rate and regular rhythm.     Heart sounds: Normal heart sounds. No murmur heard.   Pulmonary:      Effort: Pulmonary effort is normal. No respiratory distress.     Breath sounds: Normal breath sounds. Decreased air movement (Decreased movement in lower lungs) present. No wheezing, rhonchi or rales.  Musculoskeletal:        General: Normal range of motion.     Cervical back: Neck supple.  Lymphadenopathy:     Cervical: No cervical adenopathy.  Skin:    General: Skin is warm and dry.     Findings: No rash.  Neurological:     Mental Status: He is alert and oriented to person, place, and time.     Coordination: Coordination normal.  Psychiatric:        Behavior: Behavior  normal.       Assessment & Plan:   Problem List Items Addressed This Visit      Cardiovascular and Mediastinum   Chronic diastolic CHF (congestive heart failure) (HCC)   Relevant Medications   furosemide (LASIX) 20 MG tablet   metoprolol tartrate (LOPRESSOR) 25 MG tablet   Hypertension - Primary   Relevant Medications   furosemide (LASIX) 20 MG tablet   metoprolol tartrate (LOPRESSOR) 25 MG tablet   Other Relevant Orders   CBC with Differential/Platelet     Respiratory   COPD (chronic obstructive pulmonary disease) (HCC)   Relevant Orders   CBC with Differential/Platelet     Other   Hyperlipidemia   Relevant Medications   furosemide (LASIX) 20 MG tablet   metoprolol tartrate (LOPRESSOR) 25 MG tablet   Other Relevant Orders   CMP14+EGFR   Lipid panel    Other Visit Diagnoses    Fluid retention       Relevant Medications   furosemide (LASIX) 20 MG tablet      Patient did have a CT scan that showed tracheomalacia, he already has a pulmonology so discussed that with them.  It did not show any new pulmonary nodules to be concerned about.  He did have some gallstones on it I do not think anything urgently needs to be done about that especially since he would not be a good surgical candidate.  Patient follows with pulmonology and cardiology, will check blood work and see see the cardiologist and  pulmonologist and the results. Follow up plan: Return in about 6 months (around 04/28/2021), or if symptoms worsen or fail to improve, for Follow-up COPD and CHF and hypertension and cholesterol.  Counseling provided for all of the vaccine components No orders of the defined types were placed in this encounter.   Caryl Pina, MD [facility] 10/29/2020, 8:10 AM

## 2020-10-30 ENCOUNTER — Encounter: Payer: Self-pay | Admitting: Family Medicine

## 2020-10-30 DIAGNOSIS — Z86718 Personal history of other venous thrombosis and embolism: Secondary | ICD-10-CM

## 2020-10-30 DIAGNOSIS — Z86711 Personal history of pulmonary embolism: Secondary | ICD-10-CM

## 2020-10-31 DIAGNOSIS — Z86718 Personal history of other venous thrombosis and embolism: Secondary | ICD-10-CM | POA: Diagnosis not present

## 2020-10-31 DIAGNOSIS — Z7901 Long term (current) use of anticoagulants: Secondary | ICD-10-CM | POA: Diagnosis not present

## 2020-10-31 MED ORDER — WARFARIN SODIUM 3 MG PO TABS: 3.0000 mg | ORAL_TABLET | Freq: Every day | ORAL | 3 refills | Status: DC

## 2020-10-31 NOTE — Telephone Encounter (Signed)
Patient takes 3 mg daily, have sent prescription for him

## 2020-11-01 ENCOUNTER — Telehealth: Payer: Self-pay

## 2020-11-01 ENCOUNTER — Other Ambulatory Visit: Payer: Self-pay

## 2020-11-01 ENCOUNTER — Encounter: Payer: Self-pay | Admitting: Urology

## 2020-11-01 ENCOUNTER — Ambulatory Visit
Admission: RE | Admit: 2020-11-01 | Discharge: 2020-11-01 | Disposition: A | Discharge status: Home / Self Care | Payer: BC Managed Care – PPO | Source: Ambulatory Visit | Attending: Urology | Admitting: Urology

## 2020-11-01 DIAGNOSIS — C3412 Malignant neoplasm of upper lobe, left bronchus or lung: Secondary | ICD-10-CM

## 2020-11-01 DIAGNOSIS — Z85118 Personal history of other malignant neoplasm of bronchus and lung: Secondary | ICD-10-CM | POA: Diagnosis not present

## 2020-11-01 DIAGNOSIS — C3411 Malignant neoplasm of upper lobe, right bronchus or lung: Secondary | ICD-10-CM

## 2020-11-01 DIAGNOSIS — Z9981 Dependence on supplemental oxygen: Secondary | ICD-10-CM | POA: Diagnosis not present

## 2020-11-01 DIAGNOSIS — Z08 Encounter for follow-up examination after completed treatment for malignant neoplasm: Secondary | ICD-10-CM | POA: Diagnosis not present

## 2020-11-01 DIAGNOSIS — F0390 Unspecified dementia without behavioral disturbance: Secondary | ICD-10-CM | POA: Diagnosis not present

## 2020-11-01 DIAGNOSIS — Z86718 Personal history of other venous thrombosis and embolism: Secondary | ICD-10-CM

## 2020-11-01 DIAGNOSIS — Z86711 Personal history of pulmonary embolism: Secondary | ICD-10-CM

## 2020-11-01 DIAGNOSIS — C3431 Malignant neoplasm of lower lobe, right bronchus or lung: Secondary | ICD-10-CM

## 2020-11-01 NOTE — Telephone Encounter (Signed)
Fax received mdINR PT/INR self testing service Test date/time [id number] 6:13pm INR 2.3

## 2020-11-01 NOTE — Progress Notes (Signed)
Radiation Oncology         [phone] ________________________________  Name: Travis Cruz MRN: [id number]  Date: 11/01/2020  DOB: March 31, 1946  Outpatient Follow up Visit  CC: Dettinger, Fransisca Kaufmann, MD  Grace Isaac, MD  Diagnosis:  Putative metachronous stage IA NSCLC of the right upper lobe lung in patient with a history of left upper lobe NSCLC, squamous cell carcinoma, and putative NSCLC of the right lower lung - Clinical stage IA     Interval Since Last Radiation:  1 year s/p SBRT to the RUL  10/10/19, 10/14/19, 10/17/19//SBRT:  The RUL lung target was treated to 54 Gy in 3 fractions of 18 Gy  03/23/2017 to 04/02/2017:  The RLL target was treated to 50 Gy in 5 fractions of 10 Gy    11/07/13-11/16/13:  Left upper lobe / 60 Gy in 5 fractions at 12 Gray per fraction  Narrative:  I spoke with the patient's wife, Travis Cruz, due to his significant, progressive dementia, to conduct his routine 6 month follow up visit to review results from recent CT Chest scan performed on 10/24/20 via telephone to spare the patient unnecessary potential exposure in the healthcare setting during the current COVID-19 pandemic.  The patient was notified in advance and gave permission to proceed with this visit format.  He was hospitalized in 02/3030 for acute hypoxic respiratory failure secondary to acute exacerbation of COPD. He was treated with multiple rounds of antibiotics and his inhaler was changed which his wife feels has made an improvement in his breathing.  He is followed with [facility], c/o Dr. Noemi Chapel and recently seen in 07/2020.  Since his discharge home, he has remained clinically stable.  He did have a recent episode of chest pain that was evaluated with his PCP and cardiology. His recent CT Chest showed an overall stable appearance of the lungs with chronic postradiation changes of mass-like fibrosis in the lungs bilaterally, similar to the most recent prior examination and  no definitive findings to suggest recurrent or metastatic disease in the thorax. There was severe tracheobronchomalacia and mild diffuse bronchial wall thickening with mild centrilobular and moderate paraseptal emphysema, consistent with his known underlying COPD. I have reviewed these finding with his wife, Travis Cruz, today.  She reports that he has a follow up visit with his pulmonologist, Dr. Carlis Abbott, in 2 weeks.  On review of systems, obtained from wife, Travis Cruz, due to patient's dementia, the patient states that he is doing well and is currently without complaints.  He specifically denies dysphagia, chest pain, productive cough, hemoptysis, fever, chills, N/V.  He reports a decent appetite and is working on maintaining his weight.  His energy level is low but stable.  He continues on 3-4 L of oxygen via nasal cannula 24/7.  He denies fever, chills or night sweats.  ALLERGIES:  is allergic to ativan [lorazepam].  Meds: Current Outpatient Medications  Medication Sig Dispense Refill  . albuterol (VENTOLIN HFA) 108 (90 Base) MCG/ACT inhaler Inhale 2 puffs into the lungs every 4 (four) hours as needed for wheezing or shortness of breath. 18 g 11  . atorvastatin (LIPITOR) 80 MG tablet Take 1 tablet (80 mg total) by mouth every Monday, Wednesday, and Friday. 15 tablet 2  . azithromycin (ZITHROMAX) 250 MG tablet Take 1 tablet (250 mg total) by mouth every Monday, Wednesday, and Friday. 12 tablet 5  . Cholecalciferol (VITAMIN D3) 2000 units TABS Take 2,000 Units by mouth daily.     . finasteride (  PROSCAR) 5 MG tablet Take 1 tablet (5 mg total) by mouth daily. 90 tablet 3  . Fluticasone-Umeclidin-Vilant (TRELEGY ELLIPTA) 100-62.5-25 MCG/INH AEPB Inhale 1 puff into the lungs daily. 60 each 11  . furosemide (LASIX) 20 MG tablet Take 2 tablets (40 mg total) by mouth daily. 180 tablet 3  . metoprolol tartrate (LOPRESSOR) 25 MG tablet Take 1.5 tablets (37.5 mg total) by mouth 2 (two) times daily. 90 tablet 3  .  Multiple Vitamin (MULTIVITAMIN WITH MINERALS) TABS tablet Take 1 tablet by mouth daily.    . Multiple Vitamins-Minerals (PRESERVISION AREDS PO) Take 1 capsule by mouth 2 (two) times daily.    [name] MYRBETRIQ 50 MG TB24 tablet TAKE 1 TABLET BY MOUTH EVERY DAY 30 tablet 11  . sertraline (ZOLOFT) 100 MG tablet Take 1 tablet (100 mg total) by mouth daily. 90 tablet 3  . tamsulosin (FLOMAX) 0.4 MG CAPS capsule Take 1 capsule (0.4 mg total) by mouth daily after supper. (Needs to be seen before next refill) 30 capsule 3  . warfarin (COUMADIN) 3 MG tablet Take 1 tablet (3 mg total) by mouth daily at 4 PM. 90 tablet 3  . acetaminophen (TYLENOL) 325 MG tablet Take 2 tablets (650 mg total) by mouth every 6 (six) hours as needed for mild pain, fever or headache (or Fever >/= 101). (Patient not taking: Reported on 11/01/2020) 12 tablet 9  . warfarin (COUMADIN) 2 MG tablet Take 1 tablet (2 mg total) by mouth See admin instructions. Take 2mg  by mouth every tues, thurs, sat. (Takes 3mg  on all other days) (Patient not taking: Reported on 11/01/2020) 90 tablet 2   No current facility-administered medications for this encounter.    Physical Findings:  vitals were not taken for this visit.   /Unable to assess due to telephone follow-up visit format.  Lab Findings: Lab Results  Component Value Date   WBC 12.0 (H) 10/29/2020   HGB 16.8 10/29/2020   HCT 51.4 (H) 10/29/2020   MCV 89 10/29/2020   PLT 307 10/29/2020     Radiographic Findings: CT Chest W Contrast  Result Date: 10/25/2020 CLINICAL DATA:  74 year old male with history of non-small cell lung cancer. Status post radiation therapy in 2014 and 2018. Follow-up evaluation. EXAM: CT CHEST WITH CONTRAST TECHNIQUE: Multidetector CT imaging of the chest was performed during intravenous contrast administration. CONTRAST:  106mL OMNIPAQUE IOHEXOL 300 MG/ML  SOLN COMPARISON:  Chest CT 07/21/2020. FINDINGS: Cardiovascular: Heart size is borderline enlarged. There is  no significant pericardial fluid, thickening or pericardial calcification. There is aortic atherosclerosis, as well as atherosclerosis of the great vessels of the mediastinum and the coronary arteries, including calcified atherosclerotic plaque in the left main, left anterior descending, left circumflex and right coronary arteries. Right-sided pacemaker/AICD device with lead entering via left subclavian approach, terminating in the right ventricular apex. Mediastinum/Nodes: No pathologically enlarged mediastinal or hilar lymph nodes. Severe collapse of the trachea and mainstem bronchi, indicative of tracheobronchomalacia. Esophagus is unremarkable in appearance. No axillary lymphadenopathy. Lungs/Pleura: Chronic mass-like areas of architectural distortion and volume loss are again noted in the central aspect of the left upper lobe, and in the posterior aspect of the right upper lobe as well as the adjacent superior segment of the right lower lobe, presumably areas of chronic postradiation mass-like fibrosis at sites of prior radiation therapy. These findings are very similar to the prior examination. No other new suspicious appearing pulmonary nodules or masses are noted. No definite acute consolidative airspace disease. No pleural  effusions. Diffuse bronchial wall thickening with mild centrilobular and moderate paraseptal emphysema. Upper Abdomen: Numerous small calcified gallstones lying dependently in the gallbladder. Musculoskeletal: Well-circumscribed low-attenuation lesion measuring 2.3 x 1.7 cm in the superficial subcutaneous tissues anterior to the lower sternum, similar to the prior study, incompletely characterize, but likely a sebaceous cyst or other benign lesion. A smaller high attenuation (54 HU) lesion is also noted in the anterior subcutaneous fat adjacent to the mid sternum (axial image 69 of series 2), also similar to prior studies, also favored to be benign. Multiple old healed bilateral rib  fractures. Chronic compression fractures of T6, T10 and L1 are again noted, most severe at T6 where there is complete loss of central vertebral body height. There are no aggressive appearing lytic or blastic lesions noted in the visualized portions of the skeleton. IMPRESSION: 1. Chronic postradiation changes of mass-like fibrosis in the lungs bilaterally, similar to the most recent prior examination. No definitive findings to suggest recurrent or metastatic disease in the thorax. 2. Severe tracheobronchomalacia. 3. Mild diffuse bronchial wall thickening with mild centrilobular and moderate paraseptal emphysema; imaging findings suggestive of underlying COPD. 4. Aortic atherosclerosis, in addition to left main and 3 vessel coronary artery disease. Assessment for potential risk factor modification, dietary therapy or pharmacologic therapy may be warranted, if clinically indicated. 5. Cholelithiasis. 6. Additional incidental findings, as above. Aortic Atherosclerosis (ICD10-I70.0) and Emphysema (ICD10-J43.9). Electronically Signed   By: Vinnie Langton M.D.   On: 10/25/2020 07:39   CUP PACEART REMOTE DEVICE CHECK  Result Date: 10/03/2020 Scheduled remote reviewed. Normal device function.  Next remote 91 days.   Impression/Plan: 1. 74 y/o male with putative stage IA NSCLC in the right upper lobe lung and a history of left upper lobe NSCLC, squamous cell carcinoma, and putative NSCLC of the right lower lung - Clinical stage IA. He has recovered well from the effects of his recent radiotherapy and is currently without complaints.  His recent CT chest shows overall stable appearance with no evidence of new or progressive lesions or lymphadenopathy.  Therefore, we will continue with serial CT chest scans every 6 months at [facility] for convenience, with a follow-up visit or phone call thereafter to review results and recommendations.  They are comfortable with and in agreement with this plan and know to  call at any time with any questions or concerns in the interim. I will forward a copy of my note today and recent scan results to Dr. Servando Snare, Dr. Warrick Parisian, Dr. Carlis Abbott and Dr. Caryl Comes for their records as well.    Given current concerns for patient exposure during the COVID-19 pandemic, this encounter was conducted via telephone. The patient and his wife, Travis Cruz, were notified in advance and offered a [place] meeting to allow for face to face communication but unfortunately reported that they did not have the appropriate resources/technology to support such a visit and instead preferred to proceed with telephone follow up. The patient/wife has given verbal consent for this type of encounter. The time spent during this encounter was 15 minutes. The attendants for this meeting include Muskan Bolla PA-C, patient, Travis Cruz and his wife, Travis Cruz. During the encounter, Raye Slyter PA-C, was located at [facility] Radiation Oncology Department.  Patient, Travis Cruz and his wife, Travis Cruz, were located at home.   Travis Johns, PA-C    Tyler Pita, MD  [facility] Direct Dial: [phone]  Fax: [phone] [email or web address]  Skype  LinkedIn

## 2020-11-01 NOTE — Telephone Encounter (Signed)
Description   continue to take 3 mg every day  INR 2.3 (goal is 2-3),  Recheck in 1 to 2 weeks       Caryl Pina, MD [facility] 11/01/2020, 10:38 AM

## 2020-11-01 NOTE — Telephone Encounter (Signed)
Patients wife aware

## 2020-11-02 ENCOUNTER — Other Ambulatory Visit: Payer: Self-pay | Admitting: Urology

## 2020-11-02 DIAGNOSIS — C3412 Malignant neoplasm of upper lobe, left bronchus or lung: Secondary | ICD-10-CM

## 2020-11-02 DIAGNOSIS — C3411 Malignant neoplasm of upper lobe, right bronchus or lung: Secondary | ICD-10-CM

## 2020-11-04 DIAGNOSIS — J449 Chronic obstructive pulmonary disease, unspecified: Secondary | ICD-10-CM | POA: Diagnosis not present

## 2020-11-08 ENCOUNTER — Telehealth: Payer: Self-pay | Admitting: *Deleted

## 2020-11-08 DIAGNOSIS — Z86718 Personal history of other venous thrombosis and embolism: Secondary | ICD-10-CM

## 2020-11-08 DIAGNOSIS — Z86711 Personal history of pulmonary embolism: Secondary | ICD-10-CM

## 2020-11-08 NOTE — Telephone Encounter (Signed)
Description   continue to take 3 mg every day  INR 2.6 (goal is 2-3),  Recheck in 1 to 2 weeks        Caryl Pina, MD [facility] 11/08/2020, 11:31 AM

## 2020-11-08 NOTE — Telephone Encounter (Signed)
Fax received mdINR PT/INR self testing service Test date/time 11/07/20 603 pm INR 2.6

## 2020-11-13 ENCOUNTER — Ambulatory Visit (INDEPENDENT_AMBULATORY_CARE_PROVIDER_SITE_OTHER): Payer: BC Managed Care – PPO | Admitting: Critical Care Medicine

## 2020-11-13 ENCOUNTER — Other Ambulatory Visit: Payer: Self-pay

## 2020-11-13 ENCOUNTER — Encounter: Payer: Self-pay | Admitting: Critical Care Medicine

## 2020-11-13 VITALS — BP 112/72 | HR 82 | Temp 98.3°F | Ht 69.0 in | Wt 220.8 lb

## 2020-11-13 DIAGNOSIS — Z85118 Personal history of other malignant neoplasm of bronchus and lung: Secondary | ICD-10-CM

## 2020-11-13 DIAGNOSIS — J9611 Chronic respiratory failure with hypoxia: Secondary | ICD-10-CM

## 2020-11-13 DIAGNOSIS — J398 Other specified diseases of upper respiratory tract: Secondary | ICD-10-CM

## 2020-11-13 DIAGNOSIS — J449 Chronic obstructive pulmonary disease, unspecified: Secondary | ICD-10-CM
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
## 2020-11-13 NOTE — Patient Instructions (Addendum)
Thank you for visiting Dr. Carlis Abbott at [facility]. We recommend the following:  Keep all of your medications the same. Keep using your CPAP at night and whenever you are sleeping.  Keep up with your regular physical activity.    Return in about 3 months (around 02/13/2021). with Dr. Silas Flood (30 minutes visit).    Please do your part to reduce the spread of COVID-19.

## 2020-11-15 ENCOUNTER — Telehealth: Payer: Self-pay | Admitting: *Deleted

## 2020-11-15 DIAGNOSIS — Z86718 Personal history of other venous thrombosis and embolism: Secondary | ICD-10-CM

## 2020-11-15 DIAGNOSIS — Z86711 Personal history of pulmonary embolism: Secondary | ICD-10-CM

## 2020-11-15 NOTE — Telephone Encounter (Signed)
Description   continue to take 3 mg every day  INR 3.0 (goal is 2-3),  Recheck in 1 to 2 weeks       Caryl Pina, MD [facility] 11/15/2020, 9:05 AM

## 2020-11-15 NOTE — Telephone Encounter (Signed)
Fax received mdINR PT/INR self testing service Test date/time 11/14/20 746 pm INR 3.0

## 2020-11-15 NOTE — Telephone Encounter (Signed)
Pt called and aware

## 2020-11-20 DIAGNOSIS — I429 Cardiomyopathy, unspecified: Secondary | ICD-10-CM | POA: Insufficient documentation

## 2020-11-21 ENCOUNTER — Encounter: Payer: Self-pay | Admitting: Internal Medicine

## 2020-11-21 ENCOUNTER — Ambulatory Visit (INDEPENDENT_AMBULATORY_CARE_PROVIDER_SITE_OTHER): Payer: BC Managed Care – PPO | Admitting: Internal Medicine

## 2020-11-21 ENCOUNTER — Other Ambulatory Visit: Payer: Self-pay

## 2020-11-21 VITALS — BP 106/68 | HR 78 | Ht 69.0 in | Wt 224.0 lb

## 2020-11-21 DIAGNOSIS — Z95810 Presence of automatic (implantable) cardiac defibrillator: Secondary | ICD-10-CM | POA: Diagnosis not present

## 2020-11-21 DIAGNOSIS — I5032 Chronic diastolic (congestive) heart failure: Secondary | ICD-10-CM | POA: Diagnosis not present

## 2020-11-21 DIAGNOSIS — I428 Other cardiomyopathies: Secondary | ICD-10-CM | POA: Diagnosis not present

## 2020-11-21 LAB — CUP PACEART INCLINIC DEVICE CHECK
Battery Remaining Longevity: 36 mo
Brady Statistic RV Percent Paced: 0.02 %
Date Time Interrogation Session: 20211124144742
HighPow Impedance: 90 Ohm
Implantable Lead Implant Date: 20140205
Implantable Lead Location: 753860
Implantable Lead Model: 181
Implantable Lead Serial Number: 323859
Implantable Pulse Generator Implant Date: 20140205
Lead Channel Impedance Value: 450 Ohm
Lead Channel Pacing Threshold Amplitude: 1 V
Lead Channel Pacing Threshold Pulse Width: 0.5 ms
Lead Channel Sensing Intrinsic Amplitude: 11.8 mV
Lead Channel Setting Pacing Amplitude: 2.5 V
Lead Channel Setting Pacing Pulse Width: 0.5 ms
Lead Channel Setting Sensing Sensitivity: 0.5 mV
Pulse Gen Serial Number: 7040910

## 2020-11-21 NOTE — Patient Instructions (Signed)
Medication Instructions:  Your physician recommends that you continue on your current medications as directed. Please refer to the Current Medication list given to you today.  If you need a refill on your cardiac medications before your next appointment, please call your pharmacy.   Lab work: None Ordered  If you have labs (blood work) drawn today and your tests are completely normal, you will receive your results only by: [name] MyChart Message (if you have MyChart) OR . A paper copy in the mail If you have any lab test that is abnormal or we need to change your treatment, we will call you to review the results.  Testing/Procedures: None ordered  Follow-Up: Remote monitoring is used to monitor your ICD from home. This monitoring reduces the number of office visits required to check your device to one time per year. It allows [place] to keep an eye on the functioning of your device to ensure it is working properly. You are scheduled for a device check from home on 12/31/20. You may send your transmission at any time that day. If you have a wireless device, the transmission will be sent automatically. After your physician reviews your transmission, you will receive a postcard with your next transmission date.    At [facility], you and your health needs are our priority.  As part of our continuing mission to provide you with exceptional heart care, we have created designated Provider Care Teams.  These Care Teams include your primary Cardiologist (physician) and Advanced Practice Providers (APPs -  Physician Assistants and Nurse Practitioners) who all work together to provide you with the care you need, when you need it. You will need a follow up appointment in 1 year with Dr. Caryl Comes.  Please call our office 2 months in advance to schedule this appointment. Any Other Special Instructions Will Be Listed Below (If Applicable).
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
## 2020-11-26 ENCOUNTER — Telehealth: Payer: Self-pay | Admitting: *Deleted

## 2020-11-26 DIAGNOSIS — Z86711 Personal history of pulmonary embolism: Secondary | ICD-10-CM

## 2020-11-26 DIAGNOSIS — Z86718 Personal history of other venous thrombosis and embolism: Secondary | ICD-10-CM

## 2020-11-26 NOTE — Telephone Encounter (Signed)
Description   continue to take 3 mg every day  INR 2.9 (goal is 2-3),  Recheck in 1 to 2 weeks       Caryl Pina, MD [facility] 11/26/2020, 9:55 AM

## 2020-11-26 NOTE — Telephone Encounter (Signed)
Fax received mdINR PT/INR self testing service Test date/time 11/21/20 557 pm INR 2.9

## 2020-11-26 NOTE — Telephone Encounter (Signed)
Wife aware and verbalizes understanding per dpr.  °

## 2020-11-28 DIAGNOSIS — Z86718 Personal history of other venous thrombosis and embolism: Secondary | ICD-10-CM | POA: Diagnosis not present

## 2020-11-28 DIAGNOSIS — Z7901 Long term (current) use of anticoagulants: Secondary | ICD-10-CM | POA: Diagnosis not present

## 2020-11-29 ENCOUNTER — Ambulatory Visit: Payer: BC Managed Care – PPO

## 2020-11-29 ENCOUNTER — Telehealth: Payer: Self-pay | Admitting: *Deleted

## 2020-11-29 ENCOUNTER — Ambulatory Visit
Admission: RE | Admit: 2020-11-29 | Discharge: 2020-11-29 | Disposition: A | Discharge status: Home / Self Care | Payer: BC Managed Care – PPO | Source: Ambulatory Visit | Attending: Emergency Medicine | Admitting: Emergency Medicine

## 2020-11-29 ENCOUNTER — Other Ambulatory Visit: Payer: Self-pay

## 2020-11-29 VITALS — BP 116/77 | HR 90 | Temp 97.5°F | Resp 20

## 2020-11-29 DIAGNOSIS — R410 Disorientation, unspecified: Secondary | ICD-10-CM

## 2020-11-29 DIAGNOSIS — R8290 Unspecified abnormal findings in urine: Secondary | ICD-10-CM | POA: Diagnosis not present

## 2020-11-29 DIAGNOSIS — Z86711 Personal history of pulmonary embolism: Secondary | ICD-10-CM

## 2020-11-29 DIAGNOSIS — Z86718 Personal history of other venous thrombosis and embolism: Secondary | ICD-10-CM

## 2020-11-29 LAB — POCT URINALYSIS DIP (MANUAL ENTRY)
Bilirubin, UA: NEGATIVE
Blood, UA: NEGATIVE
Glucose, UA: NEGATIVE mg/dL
Leukocytes, UA: NEGATIVE
Nitrite, UA: NEGATIVE
Protein Ur, POC: NEGATIVE mg/dL
Spec Grav, UA: >=1.03 — AB (ref 1.010–1.025)
Urobilinogen, UA: 0.2 E.U./dL
pH, UA: 5.5 (ref 5.0–8.0)

## 2020-11-29 NOTE — Discharge Instructions (Signed)
Urine without infection Urine culture sent.  We will call you with the results.   Push fluids and get plenty of rest.   Follow up with PCP for recheck Return here or go to ER if you have any new or worsening symptoms such as fever, abdominal pain, nausea/vomiting, flank pain, etc..[name]

## 2020-11-29 NOTE — Telephone Encounter (Signed)
Fax received mdINR PT/INR self testing service Test date/time 11/28/20 608 pm INR 3.4

## 2020-11-29 NOTE — ED Triage Notes (Signed)
t brought in by CG with c/o foul smelling urine and more confused than usual. Pt has dementia and is poor historian but states he doesn't feel well

## 2020-11-29 NOTE — Telephone Encounter (Signed)
Description   Hold for 1 day and then continue to take 3 mg every day  INR 3.4 (goal is 2-3),  Recheck in 1 to 2 weeks       Caryl Pina, MD [facility] 11/29/2020, 4:58 PM
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
## 2020-11-30 NOTE — Telephone Encounter (Signed)
Wife aware, will hold tonight.

## 2020-12-01 LAB — URINE CULTURE

## 2020-12-02 ENCOUNTER — Ambulatory Visit (INDEPENDENT_AMBULATORY_CARE_PROVIDER_SITE_OTHER): Payer: BC Managed Care – PPO

## 2020-12-02 ENCOUNTER — Ambulatory Visit
Admission: EM | Admit: 2020-12-02 | Discharge: 2020-12-02 | Disposition: A | Discharge status: Home / Self Care | Payer: BC Managed Care – PPO | Attending: Emergency Medicine | Admitting: Emergency Medicine

## 2020-12-02 DIAGNOSIS — R509 Fever, unspecified: Secondary | ICD-10-CM

## 2020-12-02 DIAGNOSIS — R059 Cough, unspecified: Secondary | ICD-10-CM | POA: Diagnosis not present

## 2020-12-02 DIAGNOSIS — R0602 Shortness of breath: Secondary | ICD-10-CM | POA: Diagnosis not present

## 2020-12-02 DIAGNOSIS — Z1152 Encounter for screening for COVID-19: Secondary | ICD-10-CM

## 2020-12-02 MED ORDER — BENZONATATE 100 MG PO CAPS: 100.0000 mg | ORAL_CAPSULE | Freq: Three times a day (TID) | ORAL | 0 refills | Status: DC

## 2020-12-02 MED ORDER — ACETAMINOPHEN 325 MG PO TABS: 975.0000 mg | ORAL_TABLET | Freq: Once | ORAL | Status: DC

## 2020-12-02 MED ORDER — ACETAMINOPHEN 325 MG PO TABS
650.0000 mg | ORAL_TABLET | Freq: Once | ORAL | Status: AC
  Administered 2020-12-02: 650 mg via ORAL

## 2020-12-02 NOTE — ED Triage Notes (Signed)
Pt presents with complaints of cough, fever, and fatigue x 1 week. Reports he was here this week for possible UTI but then developed a cough. Pt has hx of lung disease. Pt in no distress during intake. Wife reports he sounds similar to when he has had pneumonia in the past.

## 2020-12-02 NOTE — Discharge Instructions (Addendum)
Continue Trelegy as prescribed and directed Continue Ventolin HFA as prescribed and directed Continue azithromycin as prescribed and directed by pulmonologist Follow-up with your pulmonologist Return or go to ED if you develop any new or worsening of symptoms
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
## 2020-12-03 ENCOUNTER — Other Ambulatory Visit: Payer: Self-pay | Admitting: Family Medicine

## 2020-12-03 ENCOUNTER — Other Ambulatory Visit: Payer: Self-pay

## 2020-12-03 ENCOUNTER — Encounter (HOSPITAL_COMMUNITY): Payer: Self-pay | Admitting: Emergency Medicine

## 2020-12-03 ENCOUNTER — Inpatient Hospital Stay (HOSPITAL_COMMUNITY)
Admission: EM | Admit: 2020-12-03 | Discharge: 2020-12-16 | DRG: 177 | Disposition: A | Discharge status: Skilled Nursing Facility | Payer: BC Managed Care – PPO | Attending: Family Medicine | Admitting: Family Medicine

## 2020-12-03 ENCOUNTER — Emergency Department (HOSPITAL_COMMUNITY): Payer: BC Managed Care – PPO

## 2020-12-03 DIAGNOSIS — I2510 Atherosclerotic heart disease of native coronary artery without angina pectoris: Secondary | ICD-10-CM | POA: Diagnosis present

## 2020-12-03 DIAGNOSIS — Z86718 Personal history of other venous thrombosis and embolism: Secondary | ICD-10-CM

## 2020-12-03 DIAGNOSIS — M1990 Unspecified osteoarthritis, unspecified site: Secondary | ICD-10-CM | POA: Diagnosis not present

## 2020-12-03 DIAGNOSIS — Z8674 Personal history of sudden cardiac arrest: Secondary | ICD-10-CM

## 2020-12-03 DIAGNOSIS — Q211 Atrial septal defect: Secondary | ICD-10-CM

## 2020-12-03 DIAGNOSIS — R0602 Shortness of breath: Secondary | ICD-10-CM | POA: Diagnosis not present

## 2020-12-03 DIAGNOSIS — Z888 Allergy status to other drugs, medicaments and biological substances status: Secondary | ICD-10-CM

## 2020-12-03 DIAGNOSIS — I10 Essential (primary) hypertension: Secondary | ICD-10-CM

## 2020-12-03 DIAGNOSIS — I252 Old myocardial infarction: Secondary | ICD-10-CM | POA: Diagnosis not present

## 2020-12-03 DIAGNOSIS — F419 Anxiety disorder, unspecified: Secondary | ICD-10-CM | POA: Diagnosis present

## 2020-12-03 DIAGNOSIS — R791 Abnormal coagulation profile: Secondary | ICD-10-CM | POA: Diagnosis present

## 2020-12-03 DIAGNOSIS — C3490 Malignant neoplasm of unspecified part of unspecified bronchus or lung: Secondary | ICD-10-CM | POA: Diagnosis not present

## 2020-12-03 DIAGNOSIS — F0390 Unspecified dementia without behavioral disturbance: Secondary | ICD-10-CM | POA: Diagnosis not present

## 2020-12-03 DIAGNOSIS — J439 Emphysema, unspecified: Secondary | ICD-10-CM | POA: Diagnosis present

## 2020-12-03 DIAGNOSIS — Z7901 Long term (current) use of anticoagulants: Secondary | ICD-10-CM

## 2020-12-03 DIAGNOSIS — G4733 Obstructive sleep apnea (adult) (pediatric): Secondary | ICD-10-CM | POA: Diagnosis present

## 2020-12-03 DIAGNOSIS — I5042 Chronic combined systolic (congestive) and diastolic (congestive) heart failure: Secondary | ICD-10-CM | POA: Diagnosis not present

## 2020-12-03 DIAGNOSIS — Z8673 Personal history of transient ischemic attack (TIA), and cerebral infarction without residual deficits: Secondary | ICD-10-CM

## 2020-12-03 DIAGNOSIS — R2681 Unsteadiness on feet: Secondary | ICD-10-CM | POA: Diagnosis not present

## 2020-12-03 DIAGNOSIS — J441 Chronic obstructive pulmonary disease with (acute) exacerbation: Secondary | ICD-10-CM

## 2020-12-03 DIAGNOSIS — E785 Hyperlipidemia, unspecified: Secondary | ICD-10-CM

## 2020-12-03 DIAGNOSIS — Z95810 Presence of automatic (implantable) cardiac defibrillator: Secondary | ICD-10-CM | POA: Diagnosis present

## 2020-12-03 DIAGNOSIS — C3410 Malignant neoplasm of upper lobe, unspecified bronchus or lung: Secondary | ICD-10-CM | POA: Diagnosis present

## 2020-12-03 DIAGNOSIS — G9341 Metabolic encephalopathy: Secondary | ICD-10-CM | POA: Diagnosis present

## 2020-12-03 DIAGNOSIS — Z85118 Personal history of other malignant neoplasm of bronchus and lung: Secondary | ICD-10-CM

## 2020-12-03 DIAGNOSIS — R0902 Hypoxemia: Secondary | ICD-10-CM | POA: Diagnosis not present

## 2020-12-03 DIAGNOSIS — F32A Depression, unspecified: Secondary | ICD-10-CM | POA: Diagnosis present

## 2020-12-03 DIAGNOSIS — U071 COVID-19: Principal | ICD-10-CM | POA: Diagnosis present

## 2020-12-03 DIAGNOSIS — J1282 Pneumonia due to coronavirus disease 2019: Secondary | ICD-10-CM | POA: Diagnosis not present

## 2020-12-03 DIAGNOSIS — Z86711 Personal history of pulmonary embolism: Secondary | ICD-10-CM | POA: Diagnosis not present

## 2020-12-03 DIAGNOSIS — Z923 Personal history of irradiation: Secondary | ICD-10-CM

## 2020-12-03 DIAGNOSIS — I255 Ischemic cardiomyopathy: Secondary | ICD-10-CM | POA: Diagnosis not present

## 2020-12-03 DIAGNOSIS — Z801 Family history of malignant neoplasm of trachea, bronchus and lung: Secondary | ICD-10-CM

## 2020-12-03 DIAGNOSIS — Z8249 Family history of ischemic heart disease and other diseases of the circulatory system: Secondary | ICD-10-CM

## 2020-12-03 DIAGNOSIS — Z9081 Acquired absence of spleen: Secondary | ICD-10-CM

## 2020-12-03 DIAGNOSIS — M6281 Muscle weakness (generalized): Secondary | ICD-10-CM | POA: Diagnosis not present

## 2020-12-03 DIAGNOSIS — I5032 Chronic diastolic (congestive) heart failure: Secondary | ICD-10-CM

## 2020-12-03 DIAGNOSIS — R262 Difficulty in walking, not elsewhere classified: Secondary | ICD-10-CM | POA: Diagnosis not present

## 2020-12-03 DIAGNOSIS — R0689 Other abnormalities of breathing: Secondary | ICD-10-CM | POA: Diagnosis not present

## 2020-12-03 DIAGNOSIS — J9621 Acute and chronic respiratory failure with hypoxia: Secondary | ICD-10-CM | POA: Diagnosis not present

## 2020-12-03 DIAGNOSIS — Z79899 Other long term (current) drug therapy: Secondary | ICD-10-CM

## 2020-12-03 DIAGNOSIS — I110 Hypertensive heart disease with heart failure: Secondary | ICD-10-CM | POA: Diagnosis not present

## 2020-12-03 DIAGNOSIS — C3412 Malignant neoplasm of upper lobe, left bronchus or lung: Secondary | ICD-10-CM | POA: Diagnosis present

## 2020-12-03 DIAGNOSIS — N401 Enlarged prostate with lower urinary tract symptoms: Secondary | ICD-10-CM | POA: Diagnosis present

## 2020-12-03 DIAGNOSIS — J449 Chronic obstructive pulmonary disease, unspecified: Secondary | ICD-10-CM | POA: Diagnosis not present

## 2020-12-03 DIAGNOSIS — R059 Cough, unspecified: Secondary | ICD-10-CM | POA: Diagnosis not present

## 2020-12-03 DIAGNOSIS — R000 Tachycardia, unspecified: Secondary | ICD-10-CM | POA: Diagnosis not present

## 2020-12-03 DIAGNOSIS — F339 Major depressive disorder, recurrent, unspecified: Secondary | ICD-10-CM | POA: Diagnosis not present

## 2020-12-03 DIAGNOSIS — Z87891 Personal history of nicotine dependence: Secondary | ICD-10-CM

## 2020-12-03 LAB — CBC WITH DIFFERENTIAL/PLATELET
Abs Immature Granulocytes: 0.21 10*3/uL — ABNORMAL HIGH (ref 0.00–0.07)
Basophils Absolute: 0.1 10*3/uL (ref 0.0–0.1)
Basophils Relative: 1 %
Eosinophils Absolute: 0 10*3/uL (ref 0.0–0.5)
Eosinophils Relative: 0 %
HCT: 47.5 % (ref 39.0–52.0)
Hemoglobin: 15.1 g/dL (ref 13.0–17.0)
Immature Granulocytes: 1 %
Lymphocytes Relative: 22 %
Lymphs Abs: 3.3 10*3/uL (ref 0.7–4.0)
MCH: 28.9 pg (ref 26.0–34.0)
MCHC: 31.8 g/dL (ref 30.0–36.0)
MCV: 90.8 fL (ref 80.0–100.0)
Monocytes Absolute: 2.9 10*3/uL — ABNORMAL HIGH (ref 0.1–1.0)
Monocytes Relative: 19 %
Neutro Abs: 8.7 10*3/uL — ABNORMAL HIGH (ref 1.7–7.7)
Neutrophils Relative %: 57 %
Platelets: 349 10*3/uL (ref 150–400)
RBC: 5.23 MIL/uL (ref 4.22–5.81)
RDW: 15.9 % — ABNORMAL HIGH (ref 11.5–15.5)
WBC: 15.2 10*3/uL — ABNORMAL HIGH (ref 4.0–10.5)
nRBC: 0 % (ref 0.0–0.2)

## 2020-12-03 LAB — LACTATE DEHYDROGENASE: LDH: 148 U/L (ref 98–192)

## 2020-12-03 LAB — PROTIME-INR
INR: 2.8 — ABNORMAL HIGH (ref 0.8–1.2)
Prothrombin Time: 28.2 seconds — ABNORMAL HIGH (ref 11.4–15.2)

## 2020-12-03 LAB — COMPREHENSIVE METABOLIC PANEL
ALT: 24 U/L (ref 0–44)
AST: 28 U/L (ref 15–41)
Albumin: 3.2 g/dL — ABNORMAL LOW (ref 3.5–5.0)
Alkaline Phosphatase: 120 U/L (ref 38–126)
Anion gap: 7 (ref 5–15)
BUN: 14 mg/dL (ref 8–23)
CO2: 29 mmol/L (ref 22–32)
Calcium: 8.9 mg/dL (ref 8.9–10.3)
Chloride: 100 mmol/L (ref 98–111)
Creatinine, Ser: 0.82 mg/dL (ref 0.61–1.24)
GFR, Estimated: >60 mL/min (ref >60)
Glucose, Bld: 119 mg/dL — ABNORMAL HIGH (ref 70–99)
Potassium: 4 mmol/L (ref 3.5–5.1)
Sodium: 136 mmol/L (ref 135–145)
Total Bilirubin: 0.4 mg/dL (ref 0.3–1.2)
Total Protein: 7 g/dL (ref 6.5–8.1)

## 2020-12-03 LAB — LACTIC ACID, PLASMA: Lactic Acid, Venous: 0.7 mmol/L (ref 0.5–1.9)

## 2020-12-03 LAB — FIBRINOGEN: Fibrinogen: >800 mg/dL — ABNORMAL HIGH (ref 210–475)

## 2020-12-03 LAB — BRAIN NATRIURETIC PEPTIDE: B Natriuretic Peptide: 115 pg/mL — ABNORMAL HIGH (ref 0.0–100.0)

## 2020-12-03 LAB — PROCALCITONIN: Procalcitonin: 0.11 ng/mL

## 2020-12-03 LAB — RESP PANEL BY RT-PCR (FLU A&B, COVID) ARPGX2
Influenza A by PCR: NEGATIVE
Influenza B by PCR: NEGATIVE
SARS Coronavirus 2 by RT PCR: POSITIVE — AB

## 2020-12-03 LAB — TRIGLYCERIDES: Triglycerides: 55 mg/dL (ref <150)

## 2020-12-03 LAB — C-REACTIVE PROTEIN: CRP: 2.4 mg/dL — ABNORMAL HIGH (ref <1.0)

## 2020-12-03 LAB — FERRITIN: Ferritin: 185 ng/mL (ref 24–336)

## 2020-12-03 LAB — D-DIMER, QUANTITATIVE: D-Dimer, Quant: 0.54 ug/mL-FEU — ABNORMAL HIGH (ref 0.00–0.50)

## 2020-12-03 MED ORDER — TAMSULOSIN HCL 0.4 MG PO CAPS
0.4000 mg | ORAL_CAPSULE | Freq: Every day | ORAL | Status: DC
  Administered 2020-12-03 – 2020-12-15 (×13): 0.4 mg via ORAL
  Filled 2020-12-03 (×13): qty 1

## 2020-12-03 MED ORDER — ONDANSETRON HCL 4 MG/2ML IJ SOLN: 4.0000 mg | Freq: Four times a day (QID) | INTRAMUSCULAR | Status: DC | PRN

## 2020-12-03 MED ORDER — SODIUM CHLORIDE 0.9 % IV SOLN
1.0000 g | INTRAVENOUS | Status: DC
  Administered 2020-12-03 – 2020-12-04 (×2): 1 g via INTRAVENOUS
  Filled 2020-12-03 (×4): qty 10

## 2020-12-03 MED ORDER — SODIUM CHLORIDE 0.9 % IV SOLN: 100.0000 mg | INTRAVENOUS | Status: DC

## 2020-12-03 MED ORDER — ASCORBIC ACID 500 MG PO TABS
500.0000 mg | ORAL_TABLET | Freq: Every day | ORAL | Status: DC
  Administered 2020-12-03 – 2020-12-16 (×14): 500 mg via ORAL
  Filled 2020-12-03 (×15): qty 1

## 2020-12-03 MED ORDER — FINASTERIDE 5 MG PO TABS
5.0000 mg | ORAL_TABLET | Freq: Every day | ORAL | Status: DC
  Administered 2020-12-03 – 2020-12-16 (×14): 5 mg via ORAL
  Filled 2020-12-03 (×17): qty 1

## 2020-12-03 MED ORDER — ZINC SULFATE 220 (50 ZN) MG PO CAPS
220.0000 mg | ORAL_CAPSULE | Freq: Every day | ORAL | Status: DC
  Administered 2020-12-03 – 2020-12-16 (×14): 220 mg via ORAL
  Filled 2020-12-03 (×14): qty 1

## 2020-12-03 MED ORDER — SODIUM CHLORIDE 0.9 % IV SOLN
500.0000 mg | Freq: Once | INTRAVENOUS | Status: AC
  Administered 2020-12-03: 500 mg via INTRAVENOUS
  Filled 2020-12-03: qty 500

## 2020-12-03 MED ORDER — DEXAMETHASONE SODIUM PHOSPHATE 10 MG/ML IJ SOLN
6.0000 mg | Freq: Once | INTRAMUSCULAR | Status: AC
  Administered 2020-12-03: 6 mg via INTRAVENOUS
  Filled 2020-12-03: qty 1

## 2020-12-03 MED ORDER — SODIUM CHLORIDE 0.9 % IV SOLN
100.0000 mg | Freq: Every day | INTRAVENOUS | Status: AC
  Administered 2020-12-04 – 2020-12-07 (×4): 100 mg via INTRAVENOUS
  Filled 2020-12-03 (×4): qty 20

## 2020-12-03 MED ORDER — METHYLPREDNISOLONE SODIUM SUCC 125 MG IJ SOLR
60.0000 mg | Freq: Two times a day (BID) | INTRAMUSCULAR | Status: DC
  Administered 2020-12-03 – 2020-12-08 (×12): 60 mg via INTRAVENOUS
  Filled 2020-12-03 (×13): qty 2

## 2020-12-03 MED ORDER — ONDANSETRON HCL 4 MG PO TABS: 4.0000 mg | ORAL_TABLET | Freq: Four times a day (QID) | ORAL | Status: DC | PRN

## 2020-12-03 MED ORDER — AZITHROMYCIN 250 MG PO TABS
500.0000 mg | ORAL_TABLET | Freq: Every day | ORAL | Status: DC
  Administered 2020-12-03 – 2020-12-04 (×2): 500 mg via ORAL
  Filled 2020-12-03 (×3): qty 2

## 2020-12-03 MED ORDER — ENOXAPARIN SODIUM 40 MG/0.4ML ~~LOC~~ SOLN
40.0000 mg | SUBCUTANEOUS | Status: DC
  Administered 2020-12-03: 40 mg via SUBCUTANEOUS
  Filled 2020-12-03: qty 0.4

## 2020-12-03 MED ORDER — ATORVASTATIN CALCIUM 40 MG PO TABS
80.0000 mg | ORAL_TABLET | ORAL | Status: DC
  Administered 2020-12-03 – 2020-12-14 (×6): 80 mg via ORAL
  Filled 2020-12-03 (×12): qty 2

## 2020-12-03 MED ORDER — ALBUTEROL SULFATE HFA 108 (90 BASE) MCG/ACT IN AERS
2.0000 | INHALATION_SPRAY | RESPIRATORY_TRACT | Status: DC | PRN
  Administered 2020-12-03: 2 via RESPIRATORY_TRACT

## 2020-12-03 MED ORDER — ACETAMINOPHEN 500 MG PO TABS
1000.0000 mg | ORAL_TABLET | Freq: Once | ORAL | Status: AC
  Administered 2020-12-03: 1000 mg via ORAL
  Filled 2020-12-03: qty 2

## 2020-12-03 MED ORDER — SERTRALINE HCL 50 MG PO TABS
100.0000 mg | ORAL_TABLET | Freq: Every day | ORAL | Status: DC
  Administered 2020-12-03 – 2020-12-16 (×14): 100 mg via ORAL
  Filled 2020-12-03 (×14): qty 2

## 2020-12-03 MED ORDER — WARFARIN SODIUM 2 MG PO TABS
3.0000 mg | ORAL_TABLET | Freq: Once | ORAL | Status: AC
  Administered 2020-12-03: 2.5 mg via ORAL
  Filled 2020-12-03: qty 1.5

## 2020-12-03 MED ORDER — SODIUM CHLORIDE 0.9 % IV SOLN: 100.0000 mg | Freq: Every day | INTRAVENOUS | Status: DC

## 2020-12-03 MED ORDER — VITAMIN D 25 MCG (1000 UNIT) PO TABS
2000.0000 [IU] | ORAL_TABLET | Freq: Every day | ORAL | Status: DC
  Administered 2020-12-03 – 2020-12-16 (×14): 2000 [IU] via ORAL
  Filled 2020-12-03 (×14): qty 2

## 2020-12-03 MED ORDER — IPRATROPIUM-ALBUTEROL 20-100 MCG/ACT IN AERS
2.0000 | INHALATION_SPRAY | Freq: Four times a day (QID) | RESPIRATORY_TRACT | Status: DC
  Administered 2020-12-03 – 2020-12-06 (×11): 2 via RESPIRATORY_TRACT
  Filled 2020-12-03: qty 4

## 2020-12-03 MED ORDER — ADULT MULTIVITAMIN W/MINERALS CH
1.0000 | ORAL_TABLET | Freq: Every day | ORAL | Status: DC
  Administered 2020-12-03 – 2020-12-16 (×14): 1 via ORAL
  Filled 2020-12-03 (×14): qty 1

## 2020-12-03 MED ORDER — SODIUM CHLORIDE 0.9 % IV SOLN
100.0000 mg | INTRAVENOUS | Status: AC
  Administered 2020-12-03 (×2): 100 mg via INTRAVENOUS
  Filled 2020-12-03 (×2): qty 20

## 2020-12-03 MED ORDER — AEROCHAMBER Z-STAT PLUS/MEDIUM MISC
1.0000 | Freq: Once | Status: DC
  Filled 2020-12-03: qty 1

## 2020-12-03 MED ORDER — METOPROLOL TARTRATE 25 MG PO TABS
37.5000 mg | ORAL_TABLET | Freq: Two times a day (BID) | ORAL | Status: DC
  Administered 2020-12-03 – 2020-12-16 (×26): 37.5 mg via ORAL
  Filled 2020-12-03 (×27): qty 2

## 2020-12-03 MED ORDER — FUROSEMIDE 20 MG PO TABS
20.0000 mg | ORAL_TABLET | Freq: Every day | ORAL | Status: DC
  Administered 2020-12-03 – 2020-12-16 (×14): 20 mg via ORAL
  Filled 2020-12-03 (×14): qty 1

## 2020-12-03 MED ORDER — DM-GUAIFENESIN ER 30-600 MG PO TB12
1.0000 | ORAL_TABLET | Freq: Two times a day (BID) | ORAL | Status: DC
  Administered 2020-12-03 – 2020-12-16 (×27): 1 via ORAL
  Filled 2020-12-03 (×27): qty 1

## 2020-12-03 MED ORDER — WARFARIN - PHARMACIST DOSING INPATIENT: Freq: Every day | Status: DC

## 2020-12-03 MED ORDER — ALBUTEROL SULFATE HFA 108 (90 BASE) MCG/ACT IN AERS
6.0000 | INHALATION_SPRAY | Freq: Once | RESPIRATORY_TRACT | Status: AC
  Administered 2020-12-03: 6 via RESPIRATORY_TRACT
  Filled 2020-12-03: qty 6.7

## 2020-12-03 MED ORDER — MIRABEGRON ER 25 MG PO TB24
50.0000 mg | ORAL_TABLET | Freq: Every day | ORAL | Status: DC
  Administered 2020-12-03 – 2020-12-16 (×14): 50 mg via ORAL
  Filled 2020-12-03 (×16): qty 2
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
## 2020-12-03 NOTE — Progress Notes (Addendum)
PROGRESS NOTE  Travis Cruz EZM:[id number] DOB: 12/13/1946 DOA: 12/03/2020 PCP: Dettinger, Fransisca Kaufmann, MD  Brief History:  74 year old male with a history of dementia, systolic and diastolic CHF, DVT/PE on warfarin, COPD, V. fib arrest status post AICD, hypertension, ischemic cardiomyopathy, lung cancer, chronic respiratory failure on 3 L, and stroke presenting with 5 to 6-day history of increasing confusion, low-grade fever and progressive dyspnea on exertion.  The patient is unable to provide any history.  All of history is obtained from review of the medical record and speaking with patient's spouse.  The patient has had increasing somnolence and decreasing oral intake.  The patient spouse took him to urgent care on 11/29/20 at which time a UA was unremarkable.  Home Covid test on 12/01/2020 was negative.  However, the patient continued to have low-grade fevers up to 100.9 F at home.  In addition, the patient's spouse also noted his oxygen saturation running in the low 90s, 90-92% on his usual 3 L at home. In the emergency department, the patient was febrile up to 1 to 2.4 F with tachycardia.  Oxygen saturation was 94-96% on 4 L.  WBC 15.2, hemoglobin 15.1, platelets 349,000.  Assessment/Plan: Acute on chronic respiratory failure with hypoxia--secondary to COVID-19 pneumonia -Currently stable on 4 L nasal cannula -Patient is on 3 L nasal cannula at home -Wean oxygen as tolerated -CRP 2.4>>2.0 -D-dimer 0.54>>0.47 -Ferritin 185>>217 -PCT 5.03-->TWS  Acute metabolic encephalopathy -Secondary to COVID-19 infection -12/2 UA--no pyuria -overall improved, near baseline now  Systolic and diastolic CHF-chronic -Appears clinically compensated -03/03/2019 echo EF >60 percent -Daily weights -Continue home dose furosemide  COPD exacerbation -Patient has mild wheezing on examination -Started IV steroids -Started Combivent  Dementia without behavior disturbance -at risk for  hospital delirium  Coronary artery disease -No chest pain presently -Personally reviewed EKG--sinus rhythm, no ST-T wave change  Hyperlipidemia -Continue statin  History of DVT and PE -Restarted warfarin  Essential hypertension -Continue metoprolol tartrate  Depression/anxiety -Continue Zoloft  History of V. fib arrest January 2014/ischemic cardiomyopathy -Status post AICD  Deconditioning -PT eval>>SNF       Status is: Inpatient  Remains inpatient appropriate because:IV treatments appropriate due to intensity of illness or inability to take PO   Dispo: The patient is from: Home              Anticipated d/c is to: Home              Anticipated d/c date is: 1 day              Patient currently is not medically stable to d/c.        Family Communication:   Spouse updated 12/7  Consultants:  none  Code Status:  FULL  DVT Prophylaxis:  warfarin   Procedures: As Listed in Progress Note Above  Antibiotics: Ceftriaxone 12/6>>12/7 azithro 12/6>.12/7      Subjective: Patient is pleasantly confused but more interactive.  Denies f/c, cp, sob.  No reports of vomiting  Objective: Vitals:   12/03/20 0700 12/03/20 0730 12/03/20 0800 12/03/20 0830  BP: 126/75 124/76 125/75 130/76  Pulse: 99 (!) 101 98 100  Resp: 16 20 18 20   Temp:      TempSrc:      SpO2: 96% 95% 95% 96%  Weight:      Height:       No intake or output data in the 24 hours ending 12/03/20 0835  Weight change:  Exam:   General:  Pt is alert, follows commands appropriately, not in acute distress  HEENT: No icterus, No thrush, No neck mass, [place]/AT  Cardiovascular: RRR, S1/S2, no rubs, no gallops  Respiratory: bibasilar rales no wheeze  Abdomen: Soft/+BS, non tender, non distended, no guarding  Extremities: No edema, No lymphangitis, No petechiae, No rashes, no synovitis   Data Reviewed: I have personally reviewed following labs and imaging studies Basic Metabolic  Panel: Recent Labs  Lab 12/03/20 0139  NA 136  K 4.0  CL 100  CO2 29  GLUCOSE 119*  BUN 14  CREATININE 0.82  CALCIUM 8.9   Liver Function Tests: Recent Labs  Lab 12/03/20 0139  AST 28  ALT 24  ALKPHOS 120  BILITOT 0.4  PROT 7.0  ALBUMIN 3.2*   No results for input(s): LIPASE, AMYLASE in the last 168 hours. No results for input(s): AMMONIA in the last 168 hours. Coagulation Profile: No results for input(s): INR, PROTIME in the last 168 hours. CBC: Recent Labs  Lab 12/03/20 0139  WBC 15.2*  NEUTROABS 8.7*  HGB 15.1  HCT 47.5  MCV 90.8  PLT 349   Cardiac Enzymes: No results for input(s): CKTOTAL, CKMB, CKMBINDEX, TROPONINI in the last 168 hours. BNP: Invalid input(s): POCBNP CBG: No results for input(s): GLUCAP in the last 168 hours. HbA1C: No results for input(s): HGBA1C in the last 72 hours. Urine analysis:    Component Value Date/Time   COLORURINE YELLOW 02/27/2020 1417   APPEARANCEUR Clear 07/13/2020 0000   LABSPEC 1.017 02/27/2020 1417   PHURINE 5.0 02/27/2020 1417   GLUCOSEU Negative 07/13/2020 0000   HGBUR NEGATIVE 02/27/2020 1417   BILIRUBINUR negative 11/29/2020 1313   BILIRUBINUR Negative 07/13/2020 0000   KETONESUR trace (5) (A) 11/29/2020 1313   KETONESUR NEGATIVE 02/27/2020 1417   PROTEINUR negative 11/29/2020 1313   PROTEINUR Negative 07/13/2020 0000   PROTEINUR NEGATIVE 02/27/2020 1417   UROBILINOGEN 0.2 11/29/2020 1313   UROBILINOGEN 1.0 01/31/2013 1035   NITRITE Negative 11/29/2020 1313   NITRITE Negative 07/13/2020 0000   NITRITE NEGATIVE 02/27/2020 1417   LEUKOCYTESUR Negative 11/29/2020 1313   LEUKOCYTESUR Negative 07/13/2020 0000   LEUKOCYTESUR TRACE (A) 02/27/2020 1417   Sepsis Labs: @LABRCNTIP (procalcitonin:4,lacticidven:4) ) Recent Results (from the past 240 hour(s))  Urine Culture     Status: Abnormal   Collection Time: 11/29/20  1:13 PM   Specimen: Urine, Clean Catch  Result Value Ref Range Status   Specimen  Description   Final    URINE, CLEAN CATCH Performed at [facility], [address], [place], [place] [address]    Special Requests   Final    NONE Performed at [facility], [address], [place], [place] [address]    Culture MULTIPLE SPECIES PRESENT, SUGGEST RECOLLECTION (A)  Final   Report Status 12/01/2020 FINAL  Final  Resp Panel by RT-PCR (Flu A&B, Covid) Nasopharyngeal Swab     Status: Abnormal   Collection Time: 12/03/20  1:59 AM   Specimen: Nasopharyngeal Swab; Nasopharyngeal(NP) swabs in vial transport medium  Result Value Ref Range Status   SARS Coronavirus 2 by RT PCR POSITIVE (A) NEGATIVE Final    Comment: RESULT CALLED TO, READ BACK BY AND VERIFIED WITH: EASTER,T @ 0316 ON 12/03/20 BY JUW (NOTE) SARS-CoV-2 target nucleic acids are DETECTED.  The SARS-CoV-2 RNA is generally detectable in upper respiratory specimens during the acute phase of infection. Positive results are indicative of the presence of the identified virus, but do  not rule out bacterial infection or co-infection with other pathogens not detected by the test. Clinical correlation with patient history and other diagnostic information is necessary to determine patient infection status. The expected result is Negative.  Fact Sheet for Patients: EntrepreneurPulse.com.au  Fact Sheet for Healthcare Providers: IncredibleEmployment.be  This test is not yet approved or cleared by the [place] FDA and  has been authorized for detection and/or diagnosis of SARS-CoV-2 by FDA under an Emergency Use Authorization (EUA).  This EUA will remain in effect (meaning this test can be  used) for the duration of  the COVID-19 declaration under Section 564(b)(1) of the Act, 21 U.S.C. section 360bbb-3(b)(1), unless the authorization is terminated or revoked sooner.     Influenza A by PCR NEGATIVE NEGATIVE Final   Influenza B by PCR NEGATIVE NEGATIVE Final    Comment: (NOTE) The  Xpert Xpress SARS-CoV-2/FLU/RSV plus assay is intended as an aid in the diagnosis of influenza from Nasopharyngeal swab specimens and should not be used as a sole basis for treatment. Nasal washings and aspirates are unacceptable for Xpert Xpress SARS-CoV-2/FLU/RSV testing.  Fact Sheet for Patients: EntrepreneurPulse.com.au  Fact Sheet for Healthcare Providers: IncredibleEmployment.be  This test is not yet approved or cleared by the [place] FDA and has been authorized for detection and/or diagnosis of SARS-CoV-2 by FDA under an Emergency Use Authorization (EUA). This EUA will remain in effect (meaning this test can be used) for the duration of the COVID-19 declaration under Section 564(b)(1) of the Act, 21 U.S.C. section 360bbb-3(b)(1), unless the authorization is terminated or revoked.  Performed at [facility], [address] [place], [place] [address]   Blood Culture (routine x 2)     Status: None (Preliminary result)   Collection Time: 12/03/20  2:58 AM   Specimen: Right Antecubital; Blood  Result Value Ref Range Status   Specimen Description RIGHT ANTECUBITAL  Final   Special Requests   Final    BOTTLES DRAWN AEROBIC ONLY Blood Culture results may not be optimal due to an inadequate volume of blood received in culture bottles Performed at [facility], [address], [place], [place] [address]    Culture PENDING  Incomplete   Report Status PENDING  Incomplete     Scheduled Meds: . aerochamber Z-Stat Plus/medium  1 each Other Once  . dextromethorphan-guaiFENesin  1 tablet Oral BID   Continuous Infusions: . remdesivir 100 mg in NS 100 mL    . [START ON 12/04/2020] remdesivir 100 mg in NS 100 mL      Procedures/Studies: DG Chest 2 View  Result Date: 12/02/2020 CLINICAL DATA:  Cough and shortness of breath for 3 days EXAM: CHEST - 2 VIEW COMPARISON:  07/20/2020 FINDINGS: Cardiac shadow is enlarged but stable. Defibrillator is  again noted and stable. Bilateral scarring is seen with interstitial prominence stable from the prior study. No new focal infiltrate is seen. No bony abnormality is noted. IMPRESSION: Chronic scarring bilaterally.  No acute abnormality seen. Electronically Signed   By: Inez Catalina M.D.   On: 12/02/2020 10:02   DG Chest Port 1 View  Result Date: 12/03/2020 CLINICAL DATA:  Shortness of breath and cough. EXAM: PORTABLE CHEST 1 VIEW COMPARISON:  December 02, 2020 FINDINGS: There is an unchanged appearance of the right-sided ICD. The heart size is enlarged. The lung volumes are low. Linear airspace opacities are again noted bilaterally. There is no pneumothorax. There is no large pleural effusion. No acute osseous abnormality. There are multiple old left-sided  rib fractures. IMPRESSION: No active disease. Electronically Signed   By: Constance Holster M.D.   On: 12/03/2020 02:23   CUP PACEART INCLINIC DEVICE CHECK  Result Date: 11/21/2020 ICD check in clinic. Normal device function. Thresholds and sensing consistent with previous device measurements. Impedance trends stable over time.  2 NSVT episode, seen on previous remotes, < 20 beats. Histogram distribution appropriate for patient and  level of activity. No changes made this session. Device programmed at appropriate safety margins. Device programmed to optimize intrinsic conduction. Estimated longevity 3 years. Pt enrolled in remote follow-up 12/31/20. Patient education completed. ROV w/ SK.Lavenia Atlas, BSN, RN   Orson Eva, DO  [facility]  If 7PM-7AM, please contact night-coverage [email or web address] Password [email or web address] 12/03/2020, 8:35 AM   LOS: 0 days
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
## 2020-12-03 NOTE — ED Triage Notes (Signed)
Pt with c/o increased SOB. Pt seen at Urgent Care today. PT on home O2 @ 3L. Sats 92%. EMS increased O2 to 4L and gave Albuterol neb tx in route.
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
## 2020-12-03 NOTE — ED Notes (Signed)
Date and time results received: 12/03/20 1516 (use smartphrase ".now" to insert current time)  Test: covid Critical Value: positive  Name of Provider Notified: Tomi Bamberger, MD  Orders Received? Or Actions Taken?:

## 2020-12-04 LAB — CBC WITH DIFFERENTIAL/PLATELET
Abs Immature Granulocytes: 0.09 10*3/uL — ABNORMAL HIGH (ref 0.00–0.07)
Basophils Absolute: 0 10*3/uL (ref 0.0–0.1)
Basophils Relative: 0 %
Eosinophils Absolute: 0 10*3/uL (ref 0.0–0.5)
Eosinophils Relative: 0 %
HCT: 50.1 % (ref 39.0–52.0)
Hemoglobin: 15.5 g/dL (ref 13.0–17.0)
Immature Granulocytes: 1 %
Lymphocytes Relative: 12 %
Lymphs Abs: 1.6 10*3/uL (ref 0.7–4.0)
MCH: 28.7 pg (ref 26.0–34.0)
MCHC: 30.9 g/dL (ref 30.0–36.0)
MCV: 92.8 fL (ref 80.0–100.0)
Monocytes Absolute: 1.1 10*3/uL — ABNORMAL HIGH (ref 0.1–1.0)
Monocytes Relative: 8 %
Neutro Abs: 10.4 10*3/uL — ABNORMAL HIGH (ref 1.7–7.7)
Neutrophils Relative %: 79 %
Platelets: 357 10*3/uL (ref 150–400)
RBC: 5.4 MIL/uL (ref 4.22–5.81)
RDW: 15.9 % — ABNORMAL HIGH (ref 11.5–15.5)
WBC: 13.2 10*3/uL — ABNORMAL HIGH (ref 4.0–10.5)
nRBC: 0 % (ref 0.0–0.2)

## 2020-12-04 LAB — URINALYSIS, COMPLETE (UACMP) WITH MICROSCOPIC
Bacteria, UA: NONE SEEN
Bilirubin Urine: NEGATIVE
Glucose, UA: NEGATIVE mg/dL
Hgb urine dipstick: NEGATIVE
Ketones, ur: NEGATIVE mg/dL
Leukocytes,Ua: NEGATIVE
Nitrite: NEGATIVE
Protein, ur: NEGATIVE mg/dL
Specific Gravity, Urine: 1.013 (ref 1.005–1.030)
pH: 5 (ref 5.0–8.0)

## 2020-12-04 LAB — COMPREHENSIVE METABOLIC PANEL
ALT: 25 U/L (ref 0–44)
AST: 38 U/L (ref 15–41)
Albumin: 3.1 g/dL — ABNORMAL LOW (ref 3.5–5.0)
Alkaline Phosphatase: 110 U/L (ref 38–126)
Anion gap: 8 (ref 5–15)
BUN: 17 mg/dL (ref 8–23)
CO2: 30 mmol/L (ref 22–32)
Calcium: 9.1 mg/dL (ref 8.9–10.3)
Chloride: 101 mmol/L (ref 98–111)
Creatinine, Ser: 0.68 mg/dL (ref 0.61–1.24)
GFR, Estimated: >60 mL/min (ref >60)
Glucose, Bld: 114 mg/dL — ABNORMAL HIGH (ref 70–99)
Potassium: 4.8 mmol/L (ref 3.5–5.1)
Sodium: 139 mmol/L (ref 135–145)
Total Bilirubin: 0.4 mg/dL (ref 0.3–1.2)
Total Protein: 7 g/dL (ref 6.5–8.1)

## 2020-12-04 LAB — COVID-19, FLU A+B AND RSV
Influenza A, NAA: NOT DETECTED
Influenza B, NAA: NOT DETECTED
RSV, NAA: NOT DETECTED
SARS-CoV-2, NAA: DETECTED — AB

## 2020-12-04 LAB — D-DIMER, QUANTITATIVE: D-Dimer, Quant: 0.47 ug/mL-FEU (ref 0.00–0.50)

## 2020-12-04 LAB — PROTIME-INR
INR: 3.3 — ABNORMAL HIGH (ref 0.8–1.2)
Prothrombin Time: 32.7 seconds — ABNORMAL HIGH (ref 11.4–15.2)

## 2020-12-04 LAB — FERRITIN: Ferritin: 217 ng/mL (ref 24–336)

## 2020-12-04 LAB — PROCALCITONIN: Procalcitonin: <0.1 ng/mL

## 2020-12-04 LAB — C-REACTIVE PROTEIN: CRP: 2 mg/dL — ABNORMAL HIGH (ref <1.0)

## 2020-12-04 NOTE — TOC Initial Note (Signed)
Transition of Care [facility]) - Initial/Assessment Note    Patient Details  Name: Travis Cruz MRN: [id number] Date of Birth: 10-12-1946  [facility]) CM/SW Contact:    Ihor Gully, LCSW Phone Number: 12/04/2020, 4:49 PM  Clinical Narrative:                 Patient from home with spouse. Admitted COVID+. At baseline uses RW, has wc also. On home oxygen. Requires assistance with ADLs, feeds self. Can put his shirt on/off, requires assistance with pants/shoes. PT evaluation discussed. Spouse agreeable to short term rehab. COVID accepting facilities discussed and referrals sent.   Expected Discharge Plan: Skilled Nursing Facility Barriers to Discharge: Continued Medical Work up   Patient Goals and CMS Choice Patient states their goals for this hospitalization and ongoing recovery are:: rehab and home   Choice offered to / list presented to : Spouse  Expected Discharge Plan and Services Expected Discharge Plan: [place]       Living arrangements for the past 2 months: Single Family Home                                      Prior Living Arrangements/Services Living arrangements for the past 2 months: Single Family Home Lives with:: Spouse Patient language and need for interpreter reviewed:: Yes Do you feel safe going back to the place where you live?: Yes      Need for Family Participation in Patient Care: Yes (Comment) Care giver support system in place?: Yes (comment) Current home services: DME Criminal Activity/Legal Involvement Pertinent to Current Situation/Hospitalization: No - Comment as needed  Activities of Daily Living Home Assistive Devices/Equipment: None ADL Screening (condition at time of admission) Patient's cognitive ability adequate to safely complete daily activities?: Yes Is the patient deaf or have difficulty hearing?: No Does the patient have difficulty seeing, even when wearing glasses/contacts?: No Does the  patient have difficulty concentrating, remembering, or making decisions?: Yes Patient able to express need for assistance with ADLs?: Yes Does the patient have difficulty dressing or bathing?: Yes Independently performs ADLs?: No Does the patient have difficulty walking or climbing stairs?: Yes Weakness of Legs: Both Weakness of Arms/Hands: Both  Permission Sought/Granted Permission sought to share information with : Family Supports    Share Information with NAME: Pier Bosher     Permission granted to share info w Relationship: wife     Emotional Assessment Appearance:: Appears younger than stated age   Affect (typically observed): Appropriate Orientation: : Oriented to Self   Psych Involvement: No (comment)  Admission diagnosis:  Shortness of breath [R06.02] COPD exacerbation ([place]) [J44.1] Acute on chronic respiratory failure with hypoxia ([place]) [J96.21] COVID-19 virus infection [U07.1] Patient Active Problem List   Diagnosis Date Noted  . COVID-19 virus infection 12/03/2020  . Acute on chronic respiratory failure with hypoxia ([place]) 12/03/2020  . Systolic and diastolic CHF, chronic ([place]) 12/03/2020  . Cardiomyopathy ([place]) Iscemic and Non-Ischemic 11/20/2020  . Chronic respiratory failure with hypoxia ([place]) 01/17/2020  . Primary cancer of right upper lobe of lung ([place]) 10/04/2019  . Memory deficit following unspecified cerebrovascular disease 09/08/2019  . Dementia with behavioral disturbance ([place]) 09/08/2019  . Nodule of upper lobe of right lung 09/07/2019  . Senile purpura ([place]) 11/02/2017  . Chronic diastolic CHF (congestive heart failure) ([place])   . Cancer of bronchus of right lower lobe ([place]) 02/25/2017  .  History of DVT (deep vein thrombosis) 12/04/2016  . History of pulmonary embolus (PE) 12/04/2016  . History of CVA (cerebrovascular accident) 12/04/2016  . Macular degeneration, dry 12/20/2015  . COPD (chronic obstructive pulmonary disease) ([place])   . Implantable  cardioverter-defibrillator single chamber [employer] 10/05/2013  . Malignant neoplasm of upper lobe of lung ([place]) 09/16/2013  . COPD exacerbation ([place]) 05/13/2013  . Hyperlipidemia 05/13/2013  . CAD (coronary artery disease) 05/13/2013  . History of ventricular fibrillation 03/28/2013  . Generalized ischemic myocardial dysfunction 03/15/2013  . History of non-ST elevation myocardial infarction (NSTEMI) 01/27/2013  . Patent arterial duct 03/10/2012  . Obstructive sleep apnea syndrome in adult 03/10/2012  . Chronic obstructive pulmonary disease ([place]) 03/24/2011  . Hypertension 03/24/2011  . Deep vein thrombosis ([place]) 09/23/2008   PCP:  Dettinger, Fransisca Kaufmann, MD Pharmacy:   [id number] [place], [place] [address] Phone: [phone] Fax: [phone]     Social Determinants of Health (SDOH) Interventions    Readmission Risk Interventions No flowsheet data found.

## 2020-12-04 NOTE — NC FL2 (Signed)
[place] LEVEL OF CARE SCREENING TOOL     IDENTIFICATION  Patient Name: Travis Cruz Birthdate: Nov 13, 1946 Sex: male Admission Date (Current Location): 12/03/2020  [facility] and [place] Number:  [employer] and Address:  [place] [address], [place]      Provider Number: [id number]  Attending Physician Name and Address:  Orson Eva, MD  Relative Name and Phone Number:  Sevin, Langenbach (Spouse) [phone] [facility])    Current Level of Care: Hospital Recommended Level of Care: [place] Prior Approval Number:    Date Approved/Denied:   PASRR Number: [id number]  Discharge Plan: SNF    Current Diagnoses: Patient Active Problem List   Diagnosis Date Noted  . COVID-19 virus infection 12/03/2020  . Acute on chronic respiratory failure with hypoxia ([place]) 12/03/2020  . Systolic and diastolic CHF, chronic ([place]) 12/03/2020  . Cardiomyopathy ([place]) Iscemic and Non-Ischemic 11/20/2020  . Chronic respiratory failure with hypoxia ([place]) 01/17/2020  . Primary cancer of right upper lobe of lung ([place]) 10/04/2019  . Memory deficit following unspecified cerebrovascular disease 09/08/2019  . Dementia with behavioral disturbance ([place]) 09/08/2019  . Nodule of upper lobe of right lung 09/07/2019  . Senile purpura ([place]) 11/02/2017  . Chronic diastolic CHF (congestive heart failure) ([place])   . Cancer of bronchus of right lower lobe ([place]) 02/25/2017  . History of DVT (deep vein thrombosis) 12/04/2016  . History of pulmonary embolus (PE) 12/04/2016  . History of CVA (cerebrovascular accident) 12/04/2016  . Macular degeneration, dry 12/20/2015  . COPD (chronic obstructive pulmonary disease) ([place])   . Implantable cardioverter-defibrillator single chamber [employer] [facility] 10/05/2013  . Malignant neoplasm of upper lobe of lung ([place]) 09/16/2013  . COPD exacerbation ([place]) 05/13/2013  . Hyperlipidemia 05/13/2013  . CAD (coronary artery  disease) 05/13/2013  . History of ventricular fibrillation 03/28/2013  . Generalized ischemic myocardial dysfunction 03/15/2013  . History of non-ST elevation myocardial infarction (NSTEMI) 01/27/2013  . Patent arterial duct 03/10/2012  . Obstructive sleep apnea syndrome in adult 03/10/2012  . Chronic obstructive pulmonary disease ([place]) 03/24/2011  . Hypertension 03/24/2011  . Deep vein thrombosis ([place]) 09/23/2008    Orientation RESPIRATION BLADDER Height & Weight     Self  O2 (4L) Continent Weight: 224 lb 13.9 oz (102 kg) Height:  5\' 9"  (175.3 cm)  BEHAVIORAL SYMPTOMS/MOOD NEUROLOGICAL BOWEL NUTRITION STATUS      Continent Diet (Heart healthy)  AMBULATORY STATUS COMMUNICATION OF NEEDS Skin   Limited Assist Verbally Normal                       Personal Care Assistance Level of Assistance  Bathing, Feeding, Dressing Bathing Assistance: Limited assistance Feeding assistance: Independent Dressing Assistance: Limited assistance     Functional Limitations Info  Sight, Hearing, Speech Sight Info: Adequate Hearing Info: Adequate Speech Info: Adequate    SPECIAL CARE FACTORS FREQUENCY  PT (By licensed PT)     PT Frequency: 5x/week              Contractures      Additional Factors Info  Code Status, Allergies Code Status Info: Full Allergies Info: Ativan           Current Medications (12/04/2020):  This is the current hospital active medication list Current Facility-Administered Medications  Medication Dose Route Frequency Provider Last Rate Last Admin  . aerochamber Z-Stat Plus/medium 1 each  1 each Other Once Rolland Porter, MD      .  albuterol (VENTOLIN HFA) 108 (90 Base) MCG/ACT inhaler 2 puff  2 puff Inhalation Q4H PRN Orson Eva, MD   2 puff at 12/03/20 1820  . ascorbic acid (VITAMIN C) tablet 500 mg  500 mg Oral Daily Tat, David, MD   500 mg at 12/04/20 1037  . atorvastatin (LIPITOR) tablet 80 mg  80 mg Oral Q M,W,F Orson Eva, MD   80 mg at 12/03/20 1042   . azithromycin (ZITHROMAX) tablet 500 mg  500 mg Oral Daily Tat, David, MD   500 mg at 12/04/20 1037  . cefTRIAXone (ROCEPHIN) 1 g in sodium chloride 0.9 % 100 mL IVPB  1 g Intravenous Q24H Tat, David, MD 200 mL/hr at 12/04/20 1038 1 g at 12/04/20 1038  . cholecalciferol (VITAMIN D3) tablet 2,000 Units  2,000 Units Oral Daily Tat, David, MD   2,000 Units at 12/04/20 1038  . dextromethorphan-guaiFENesin (MUCINEX DM) 30-600 MG per 12 hr tablet 1 tablet  1 tablet Oral BID Rolland Porter, MD   1 tablet at 12/04/20 1038  . finasteride (PROSCAR) tablet 5 mg  5 mg Oral Daily Tat, David, MD   5 mg at 12/04/20 1037  . furosemide (LASIX) tablet 20 mg  20 mg Oral Daily Tat, David, MD   20 mg at 12/04/20 1037  . Ipratropium-Albuterol (COMBIVENT) respimat 2 puff  2 puff Inhalation Q6H Orson Eva, MD   2 puff at 12/04/20 1548  . methylPREDNISolone sodium succinate (SOLU-MEDROL) 125 mg/2 mL injection 60 mg  60 mg Intravenous Therisa Doyne, MD   60 mg at 12/04/20 1038  . metoprolol tartrate (LOPRESSOR) tablet 37.5 mg  37.5 mg Oral BID Tat, Shanon Brow, MD   37.5 mg at 12/04/20 1037  . mirabegron ER (MYRBETRIQ) tablet 50 mg  50 mg Oral Daily Tat, David, MD   50 mg at 12/04/20 1037  . multivitamin with minerals tablet 1 tablet  1 tablet Oral Daily Tat, David, MD   1 tablet at 12/04/20 1051  . ondansetron (ZOFRAN) tablet 4 mg  4 mg Oral Q6H PRN Tat, Shanon Brow, MD       Or  . ondansetron (ZOFRAN) injection 4 mg  4 mg Intravenous Q6H PRN Tat, Shanon Brow, MD      . remdesivir 100 mg in sodium chloride 0.9 % 100 mL IVPB  100 mg Intravenous Daily Tat, David, MD 200 mL/hr at 12/04/20 1054 100 mg at 12/04/20 1054  . sertraline (ZOLOFT) tablet 100 mg  100 mg Oral Daily Tat, David, MD   100 mg at 12/04/20 1037  . tamsulosin (FLOMAX) capsule 0.4 mg  0.4 mg Oral QPC supper Tat, Shanon Brow, MD   0.4 mg at 12/03/20 1821  . Warfarin - Pharmacist Dosing Inpatient   Does not apply q1600 Tat, David, MD      . zinc sulfate capsule 220 mg  220 mg Oral  Daily Tat, David, MD   220 mg at 12/04/20 1037     Discharge Medications: Please see discharge summary for a list of discharge medications.  Relevant Imaging Results:  Relevant Lab Results:   Additional Information SSN: [id number]. COVID + 12/02/20  Mallory Enriques, Clydene Pugh, LCSW
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
## 2020-12-04 NOTE — Plan of Care (Signed)
  Problem: Acute Rehab PT Goals(only PT should resolve) Goal: Pt Will Go Supine/Side To Sit Outcome: Progressing Flowsheets (Taken 12/04/2020 1439) Pt will go Supine/Side to Sit: with supervision Goal: Patient Will Transfer Sit To/From Stand Outcome: Progressing Flowsheets (Taken 12/04/2020 1439) Patient will transfer sit to/from stand:  with min guard assist  with minimal assist Goal: Pt Will Transfer Bed To Chair/Chair To Bed Outcome: Progressing Flowsheets (Taken 12/04/2020 1439) Pt will Transfer Bed to Chair/Chair to Bed: with min assist Goal: Pt Will Ambulate Outcome: Progressing Flowsheets (Taken 12/04/2020 1439) Pt will Ambulate:  50 feet  with minimal assist  with rolling walker   2:39 PM, 12/04/20 [name], MPT Physical Therapist with [facility] [phone] office [phone] mobile phone

## 2020-12-04 NOTE — Evaluation (Signed)
Physical Therapy Evaluation Patient Details Name: Travis Cruz MRN: [id number] DOB: Jan 05, 1946 Today's Date: 12/04/2020   History of Present Illness  Travis Cruz is a 74 y.o. male with medical history of dementia, systolic and diastolic CHF, DVT/PE on warfarin, COPD, V. fib arrest status post AICD, hypertension, ischemic cardiomyopathy, lung cancer, chronic respiratory failure on 3 L, and stroke presenting with 5 to 6-day history of increasing confusion, low-grade fever and progressive dyspnea on exertion.  The patient is unable to provide any history.  All of history is obtained from review of the medical record and speaking with patient's spouse.  The patient has had increasing somnolence and decreasing oral intake.  The patient spouse took him to urgent care on 11/29/20 at which time a UA was unremarkable.  Home Covid test on 12/01/2020 was negative.  However, the patient continued to have low-grade fevers up to 100.9 F at home.  In addition, the patient's spouse also noted his oxygen saturation running in the low 90s, 90-92% on his usual 3 L at home.In the emergency department, the patient was febrile up to 102.4 F with tachycardia.  Oxygen saturation was 94-96% on 4 L.  WBC 15.2, hemoglobin 15.1, platelets 349,000.  BMP was essentially unremarkable with serum creatinine 0.82.  LFTs were unremarkable.  Chest x-ray showed right lower lobe, right lower lobe, left lower lobe opacities which have been chronic.  The patient was started on dexamethasone and remdesivir.    Clinical Impression  Patient demonstrates labored movement for sitting up at bedside, unsteady on feet demonstrating slow labored cadence during ambulation requiring increased time to make turns, frequent verbal/tactile cueing to avoid loss of balance and limit mostly due to fatigue.  Patient tolerated sitting up in chair after therapy - RN notified.  Patient will benefit from continued physical therapy in hospital and  recommended venue below to increase strength, balance, endurance for safe ADLs and gait.      Follow Up Recommendations SNF;Supervision for mobility/OOB;Supervision - Intermittent    Equipment Recommendations  None recommended by PT    Recommendations for Other Services       Precautions / Restrictions Precautions Precautions: Fall Restrictions Weight Bearing Restrictions: No      Mobility  Bed Mobility Overal bed mobility: Needs Assistance Bed Mobility: Supine to Sit     Supine to sit: Min assist     General bed mobility comments: increased time, labored movement    Transfers Overall transfer level: Needs assistance Equipment used: Rolling walker (2 wheeled) Transfers: Sit to/from [facility] Sit to Stand: Min assist Stand pivot transfers: Min assist;Mod assist       General transfer comment: slow labored movement  Ambulation/Gait Ambulation/Gait assistance: Min assist;Mod assist Gait Distance (Feet): 20 Feet Assistive device: Rolling walker (2 wheeled) Gait Pattern/deviations: Decreased step length - right;Decreased step length - left;Decreased stride length Gait velocity: decreased   General Gait Details: slow labored cadence requiring increased time to make turns, near loss of balance when stepping backwards to transfer to chair, limited secondary to fatigue/poor standing balance  Stairs            Wheelchair Mobility    Modified Rankin (Stroke Patients Only)       Balance Overall balance assessment: Needs assistance Sitting-balance support: Feet supported;No upper extremity supported Sitting balance-Leahy Scale: Fair Sitting balance - Comments: fair/good seated at EOB   Standing balance support: During functional activity;Bilateral upper extremity supported Standing balance-Leahy Scale: Poor Standing balance comment: fair/poor using  RW                             Pertinent Vitals/Pain Pain Assessment:  No/denies pain    Home Living Family/patient expects to be discharged to:: Private residence   Available Help at Discharge: Family;Available 24 hours/day Type of Home: House Home Access: Ramped entrance     Home Layout: One level Home Equipment: Walker - 2 wheels;Wheelchair - manual;Bedside commode;Walker - 4 wheels      Prior Function Level of Independence: Needs assistance   Gait / Transfers Assistance Needed: [employer]chant [employer], on Home O2  ADL's / Homemaking Assistance Needed: assisted by family        Hand Dominance   Dominant Hand: Right    Extremity/Trunk Assessment   Upper Extremity Assessment Upper Extremity Assessment: Generalized weakness    Lower Extremity Assessment Lower Extremity Assessment: Generalized weakness    Cervical / Trunk Assessment Cervical / Trunk Assessment: Normal  Communication   Communication: No difficulties  Cognition Arousal/Alertness: Awake/alert Behavior During Therapy: WFL for tasks assessed/performed Overall Cognitive Status: History of cognitive impairments - at baseline                                        General Comments      Exercises     Assessment/Plan    PT Assessment Patient needs continued PT services  PT Problem List Decreased strength;Decreased activity tolerance;Decreased balance;Decreased mobility       PT Treatment Interventions DME instruction;Stair training;Functional mobility training;Therapeutic activities;Therapeutic exercise;Patient/family education    PT Goals (Current goals can be found in the Care Plan section)  Acute Rehab PT Goals Patient Stated Goal: return home with family to assist PT Goal Formulation: With patient Time For Goal Achievement: 12/18/20 Potential to Achieve Goals: Good    Frequency Min 3X/week   Barriers to discharge        Co-evaluation               AM-PAC PT "6 Clicks" Mobility  Outcome Measure Help needed turning  from your back to your side while in a flat bed without using bedrails?: A Little Help needed moving from lying on your back to sitting on the side of a flat bed without using bedrails?: A Little Help needed moving to and from a bed to a chair (including a wheelchair)?: A Lot Help needed standing up from a chair using your arms (e.g., wheelchair or bedside chair)?: A Lot Help needed to walk in hospital room?: A Lot Help needed climbing 3-5 steps with a railing? : A Lot 6 Click Score: 14    End of Session Equipment Utilized During Treatment: Oxygen Activity Tolerance: Patient tolerated treatment well;Patient limited by fatigue Patient left: in chair;with call bell/phone within reach;with chair alarm set Nurse Communication: Mobility status PT Visit Diagnosis: Unsteadiness on feet (R26.81);Other abnormalities of gait and mobility (R26.89);Muscle weakness (generalized) (M62.81)    Time: 5909-3112 PT Time Calculation (min) (ACUTE ONLY): 22 min   Charges:   PT Evaluation $PT Eval Moderate Complexity: 1 Mod PT Treatments $Therapeutic Activity: 8-22 mins        2:38 PM, 12/04/20 [name], MPT Physical Therapist with [facility] [phone] office [phone] mobile phone

## 2020-12-05 DIAGNOSIS — R791 Abnormal coagulation profile: Secondary | ICD-10-CM

## 2020-12-05 LAB — CBC WITH DIFFERENTIAL/PLATELET
Abs Immature Granulocytes: 0.09 10*3/uL — ABNORMAL HIGH (ref 0.00–0.07)
Basophils Absolute: 0 10*3/uL (ref 0.0–0.1)
Basophils Relative: 0 %
Eosinophils Absolute: 0 10*3/uL (ref 0.0–0.5)
Eosinophils Relative: 0 %
HCT: 51.1 % (ref 39.0–52.0)
Hemoglobin: 15.8 g/dL (ref 13.0–17.0)
Immature Granulocytes: 1 %
Lymphocytes Relative: 9 %
Lymphs Abs: 1.4 10*3/uL (ref 0.7–4.0)
MCH: 28.7 pg (ref 26.0–34.0)
MCHC: 30.9 g/dL (ref 30.0–36.0)
MCV: 92.9 fL (ref 80.0–100.0)
Monocytes Absolute: 1.2 10*3/uL — ABNORMAL HIGH (ref 0.1–1.0)
Monocytes Relative: 7 %
Neutro Abs: 13.6 10*3/uL — ABNORMAL HIGH (ref 1.7–7.7)
Neutrophils Relative %: 83 %
Platelets: 379 10*3/uL (ref 150–400)
RBC: 5.5 MIL/uL (ref 4.22–5.81)
RDW: 15.9 % — ABNORMAL HIGH (ref 11.5–15.5)
WBC: 16.2 10*3/uL — ABNORMAL HIGH (ref 4.0–10.5)
nRBC: 0.1 % (ref 0.0–0.2)

## 2020-12-05 LAB — COMPREHENSIVE METABOLIC PANEL
ALT: 28 U/L (ref 0–44)
AST: 33 U/L (ref 15–41)
Albumin: 2.9 g/dL — ABNORMAL LOW (ref 3.5–5.0)
Alkaline Phosphatase: 104 U/L (ref 38–126)
Anion gap: 8 (ref 5–15)
BUN: 22 mg/dL (ref 8–23)
CO2: 33 mmol/L — ABNORMAL HIGH (ref 22–32)
Calcium: 9.1 mg/dL (ref 8.9–10.3)
Chloride: 97 mmol/L — ABNORMAL LOW (ref 98–111)
Creatinine, Ser: 0.73 mg/dL (ref 0.61–1.24)
GFR, Estimated: >60 mL/min (ref >60)
Glucose, Bld: 127 mg/dL — ABNORMAL HIGH (ref 70–99)
Potassium: 4.5 mmol/L (ref 3.5–5.1)
Sodium: 138 mmol/L (ref 135–145)
Total Bilirubin: 0.3 mg/dL (ref 0.3–1.2)
Total Protein: 6.6 g/dL (ref 6.5–8.1)

## 2020-12-05 LAB — PROTIME-INR
INR: 4.2 (ref 0.8–1.2)
Prothrombin Time: 39 seconds — ABNORMAL HIGH (ref 11.4–15.2)

## 2020-12-05 LAB — C-REACTIVE PROTEIN: CRP: 0.8 mg/dL (ref <1.0)

## 2020-12-05 LAB — D-DIMER, QUANTITATIVE: D-Dimer, Quant: 0.38 ug/mL-FEU (ref 0.00–0.50)

## 2020-12-05 LAB — FERRITIN: Ferritin: 232 ng/mL (ref 24–336)

## 2020-12-05 NOTE — Progress Notes (Signed)
Pt's wife updated on pt condition via phone.
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
## 2020-12-05 NOTE — Progress Notes (Signed)
PROGRESS NOTE  Revis Whalin VZC:[id number] DOB: 29-May-1946 DOA: 12/03/2020 PCP: Dettinger, Fransisca Kaufmann, MD  Brief History:  74 year old male with a history of dementia, systolic and diastolic CHF, DVT/PE on warfarin, COPD, V. fib arrest status post AICD, hypertension, ischemic cardiomyopathy, lung cancer, chronic respiratory failure on 3 L, and stroke presenting with 5 to 6-day history of increasing confusion, low-grade fever and progressive dyspnea on exertion.  The patient is unable to provide any history.  All of history is obtained from review of the medical record and speaking with patient's spouse.  The patient has had increasing somnolence and decreasing oral intake.  The patient spouse took him to urgent care on 11/29/20 at which time a UA was unremarkable.  Home Covid test on 12/01/2020 was negative.  However, the patient continued to have low-grade fevers up to 100.9 F at home.  In addition, the patient's spouse also noted his oxygen saturation running in the low 90s, 90-92% on his usual 3 L at home. In the emergency department, the patient was febrile up to 1 to 2.4 F with tachycardia.  Oxygen saturation was 94-96% on 4 L.  WBC 15.2, hemoglobin 15.1, platelets 349,000.  Assessment/Plan: Acute on chronic respiratory failure with hypoxia--secondary to COVID-19 pneumonia -Currently stable on 4-4.5 L nasal cannula -Patient is on 3 L nasal cannula at home -Continue to wean oxygen supplementation to baseline as tolerated.   -Continue vitamin C and zinc. -Continue treatment with steroids and remdesivir; day 3 out of 5. -CRP 2.4>>2.0 -D-dimer 0.54>>0.47 -Ferritin 185>>217 -PCT 0.11-->NEG; antibiotic has been discontinued.  Acute metabolic encephalopathy -Secondary to COVID-19 infection -12/2 UA--no pyuria -overall improved, near baseline now -Continue constant reorientation.  Systolic and diastolic CHF-chronic -Appears clinically compensated -03/03/2019 echo EF >60  percent -Continue to follow Daily weights -Continue furosemide -Follow strict I's and O's  COPD exacerbation -Intermittent wheezing has been described -Reporting intermittent coughing spells and very little difficulty speaking in full sentences currently. -Continue steroids and bronchodilator therapy.  Dementia without behavior disturbance -at high risk for hospital delirium -Continue constant reorientation and supportive care.    Coronary artery disease -No chest pain presently -Personally reviewed EKG--sinus rhythm, no ST-T wave change  Hyperlipidemia -Continue statin -Heart healthy diet has been encouraged.  History of DVT and PE -Pharmacy to dose  Essential hypertension -Continue metoprolol tartrate -Stable blood pressure.  Depression/anxiety -Continue Zoloft -Continue constant reorientation -Mood stable currently.  History of V. fib arrest January 2014/ischemic cardiomyopathy -Status post AICD -Continue telemetry monitoring -Patient denies palpitations.  Deconditioning -PT eval>>SNF -Family in agreement -Patient will most likely be clinically stable for discharge to skilled nursing facility on 12/07/2020.  Supratherapeutic INR -INR 4.16 -No Coumadin today -Continue to follow trend -Pharmacy helping with dose adjustment.  Status is: Inpatient    Dispo: The patient is from: Home              Anticipated d/c is to: Home              Anticipated d/c date is: 1 day              Patient currently is not medically stable to d/c.  Still short of breath, generally weak/deconditioned requiring 4-4.5 nasal cannula supplementation.  Still with intermittent coughing spells and reported decreased appetite.    Family Communication:   No family at bedside; patient's wife was updated on 12/04/2020.  Consultants:  none  Code Status:  FULL  DVT Prophylaxis:  warfarin   Procedures: As Listed in Progress Note Above  Antibiotics: Ceftriaxone  12/6>>12/7 azithro 12/6>.12/7   Subjective: Afebrile currently; following commands appropriately and expressing no chest pain, no nausea, no vomiting.  Still complaining of being short winded with activity, requiring 4-4.5 L nasal cannula supplementation and reporting decreased appetite.  Objective: Vitals:   12/05/20 0545 12/05/20 0849 12/05/20 0909 12/05/20 1441  BP: 124/80 139/86    Pulse: 75 78    Resp: 18 18    Temp: 97.6 F (36.4 C)     TempSrc: Oral     SpO2: 94% 96% 93% 92%  Weight:      Height:        Intake/Output Summary (Last 24 hours) at 12/05/2020 1454 Last data filed at 12/05/2020 1331 Gross per 24 hour  Intake 1280 ml  Output 550 ml  Net 730 ml   Weight change:  Exam: General exam: Alert, awake, oriented x 2 intermittently; following commands appropriately.  Denies chest pain, no nausea or vomiting.  Still having intermittent coughing spells, requiring 4-4.5 L nasal cannula oxygen supplementation and feeling short winded with activity. Respiratory system: No wheezing, diffuse rhonchi bilaterally; no using accessory muscles. Cardiovascular system: Rate controlled, no rubs, no gallops, no JVD on exam. Gastrointestinal system: Abdomen is nondistended, soft and nontender. No organomegaly or masses felt. Normal bowel sounds heard. Central nervous system: No focal neurological deficits. Extremities: No cyanosis or clubbing. Skin: No petechiae. Psychiatry: Mood & affect appropriate.    Data Reviewed: I have personally reviewed following labs and imaging studies  Basic Metabolic Panel: Recent Labs  Lab 12/03/20 0139 12/04/20 0429 12/05/20 0414  NA 136 139 138  K 4.0 4.8 4.5  CL 100 101 97*  CO2 29 30 33*  GLUCOSE 119* 114* 127*  BUN 14 17 22   CREATININE 0.82 0.68 0.73  CALCIUM 8.9 9.1 9.1   Liver Function Tests: Recent Labs  Lab 12/03/20 0139 12/04/20 0429 12/05/20 0414  AST 28 38 33  ALT 24 25 28   ALKPHOS 120 110 104  BILITOT 0.4 0.4 0.3   PROT 7.0 7.0 6.6  ALBUMIN 3.2* 3.1* 2.9*   Coagulation Profile: Recent Labs  Lab 12/03/20 1354 12/04/20 0429 12/05/20 0414  INR 2.8* 3.3* 4.2*   CBC: Recent Labs  Lab 12/03/20 0139 12/04/20 0429 12/05/20 0414  WBC 15.2* 13.2* 16.2*  NEUTROABS 8.7* 10.4* 13.6*  HGB 15.1 15.5 15.8  HCT 47.5 50.1 51.1  MCV 90.8 92.8 92.9  PLT 349 357 379   Urine analysis:    Component Value Date/Time   COLORURINE YELLOW 12/04/2020 1700   APPEARANCEUR CLEAR 12/04/2020 1700   APPEARANCEUR Clear 07/13/2020 0000   LABSPEC 1.013 12/04/2020 1700   PHURINE 5.0 12/04/2020 1700   GLUCOSEU NEGATIVE 12/04/2020 1700   HGBUR NEGATIVE 12/04/2020 1700   BILIRUBINUR NEGATIVE 12/04/2020 1700   BILIRUBINUR negative 11/29/2020 1313   BILIRUBINUR Negative 07/13/2020 0000   KETONESUR NEGATIVE 12/04/2020 1700   PROTEINUR NEGATIVE 12/04/2020 1700   UROBILINOGEN 0.2 11/29/2020 1313   UROBILINOGEN 1.0 01/31/2013 1035   NITRITE NEGATIVE 12/04/2020 1700   LEUKOCYTESUR NEGATIVE 12/04/2020 1700   Sepsis Labs:  Recent Results (from the past 240 hour(s))  Urine Culture     Status: Abnormal   Collection Time: 11/29/20  1:13 PM   Specimen: Urine, Clean Catch  Result Value Ref Range Status   Specimen Description   Final    URINE, CLEAN CATCH Performed at [facility], [place]  [address] [place], [place] [address]    Special Requests   Final    NONE Performed at [facility], [address], [place], [place] [address]    Culture MULTIPLE SPECIES PRESENT, SUGGEST RECOLLECTION (A)  Final   Report Status 12/01/2020 FINAL  Final  COVID-19, Flu A+B and RSV ([facility])     Status: Abnormal   Collection Time: 12/02/20 10:19 AM  Result Value Ref Range Status   SARS-CoV-2, NAA Detected (A) Not Detected Final    Comment: Patients who have a positive COVID-19 test result may now have treatment options. Treatment options are available for patients with mild to moderate symptoms and for hospitalized patients. Visit our  website at [email or web address] for resources and information. This nucleic acid amplification test was developed and its performance characteristics determined by [employer]. Nucleic acid amplification tests include RT-PCR and TMA. This test has not been FDA cleared or approved. This test has been authorized by FDA under an Emergency Use Authorization (EUA). This test is only authorized for the duration of time the declaration that circumstances exist justifying the authorization of the emergency use of in vitro diagnostic tests for detection of SARS-CoV-2 virus and/or diagnosis of COVID-19 infection under section 564(b)(1) of the Act, 21 U.S.C. 433IRJ-1(O) (1), unless the authorization is terminated or revoked sooner. When diagnostic testing is negativ e, the possibility of a false negative result should be considered in the context of a patient's recent exposures and the presence of clinical signs and symptoms consistent with COVID-19. An individual without symptoms of COVID-19 and who is not shedding SARS-CoV-2 virus would expect to have a negative (not detected) result in this assay.    Influenza A, NAA Not Detected Not Detected Final   Influenza B, NAA Not Detected Not Detected Final   RSV, NAA Not Detected Not Detected Final  Resp Panel by RT-PCR (Flu A&B, Covid) Nasopharyngeal Swab     Status: Abnormal   Collection Time: 12/03/20  1:59 AM   Specimen: Nasopharyngeal Swab; Nasopharyngeal(NP) swabs in vial transport medium  Result Value Ref Range Status   SARS Coronavirus 2 by RT PCR POSITIVE (A) NEGATIVE Final    Comment: RESULT CALLED TO, READ BACK BY AND VERIFIED WITH: EASTER,T @ 0316 ON 12/03/20 BY JUW (NOTE) SARS-CoV-2 target nucleic acids are DETECTED.  The SARS-CoV-2 RNA is generally detectable in upper respiratory specimens during the acute phase of infection. Positive results are indicative of the presence of the identified virus, but do not  rule out bacterial infection or co-infection with other pathogens not detected by the test. Clinical correlation with patient history and other diagnostic information is necessary to determine patient infection status. The expected result is Negative.  Fact Sheet for Patients: EntrepreneurPulse.com.au  Fact Sheet for Healthcare Providers: IncredibleEmployment.be  This test is not yet approved or cleared by the [place] FDA and  has been authorized for detection and/or diagnosis of SARS-CoV-2 by FDA under an Emergency Use Authorization (EUA).  This EUA will remain in effect (meaning this test can be  used) for the duration of  the COVID-19 declaration under Section 564(b)(1) of the Act, 21 U.S.C. section 360bbb-3(b)(1), unless the authorization is terminated or revoked sooner.     Influenza A by PCR NEGATIVE NEGATIVE Final   Influenza B by PCR NEGATIVE NEGATIVE Final    Comment: (NOTE) The Xpert Xpress SARS-CoV-2/FLU/RSV plus assay is intended as an aid in the diagnosis of influenza from Nasopharyngeal swab specimens and should not be used as a sole  basis for treatment. Nasal washings and aspirates are unacceptable for Xpert Xpress SARS-CoV-2/FLU/RSV testing.  Fact Sheet for Patients: EntrepreneurPulse.com.au  Fact Sheet for Healthcare Providers: IncredibleEmployment.be  This test is not yet approved or cleared by the [place] FDA and has been authorized for detection and/or diagnosis of SARS-CoV-2 by FDA under an Emergency Use Authorization (EUA). This EUA will remain in effect (meaning this test can be used) for the duration of the COVID-19 declaration under Section 564(b)(1) of the Act, 21 U.S.C. section 360bbb-3(b)(1), unless the authorization is terminated or revoked.  Performed at [facility], [address] [place], [place] [address]   Blood Culture (routine x 2)     Status: None  (Preliminary result)   Collection Time: 12/03/20  2:58 AM   Specimen: Right Antecubital; Blood  Result Value Ref Range Status   Specimen Description RIGHT ANTECUBITAL  Final   Special Requests   Final    BOTTLES DRAWN AEROBIC ONLY Blood Culture results may not be optimal due to an inadequate volume of blood received in culture bottles   Culture   Final    NO GROWTH < 12 HOURS Performed at [facility], [address] [place], [place] [address]    Report Status PENDING  Incomplete  Blood Culture (routine x 2)     Status: None (Preliminary result)   Collection Time: 12/03/20  3:45 AM   Specimen: BLOOD RIGHT HAND  Result Value Ref Range Status   Specimen Description BLOOD RIGHT HAND  Final   Special Requests   Final    BOTTLES DRAWN AEROBIC AND ANAEROBIC Blood Culture adequate volume   Culture   Final    NO GROWTH < 12 HOURS Performed at [facility], [address], [place], [place] [address]    Report Status PENDING  Incomplete     Scheduled Meds: . aerochamber Z-Stat Plus/medium  1 each Other Once  . vitamin C  500 mg Oral Daily  . atorvastatin  80 mg Oral Q M,W,F  . cholecalciferol  2,000 Units Oral Daily  . dextromethorphan-guaiFENesin  1 tablet Oral BID  . finasteride  5 mg Oral Daily  . furosemide  20 mg Oral Daily  . Ipratropium-Albuterol  2 puff Inhalation Q6H  . methylPREDNISolone (SOLU-MEDROL) injection  60 mg Intravenous Q12H  . metoprolol tartrate  37.5 mg Oral BID  . mirabegron ER  50 mg Oral Daily  . multivitamin with minerals  1 tablet Oral Daily  . sertraline  100 mg Oral Daily  . tamsulosin  0.4 mg Oral QPC supper  . Warfarin - Pharmacist Dosing Inpatient   Does not apply q1600  . zinc sulfate  220 mg Oral Daily   Continuous Infusions: . remdesivir 100 mg in NS 100 mL 100 mg (12/05/20 1133)    Procedures/Studies: DG Chest 2 View  Result Date: 12/02/2020 CLINICAL DATA:  Cough and shortness of breath for 3 days EXAM: CHEST - 2 VIEW COMPARISON:   07/20/2020 FINDINGS: Cardiac shadow is enlarged but stable. Defibrillator is again noted and stable. Bilateral scarring is seen with interstitial prominence stable from the prior study. No new focal infiltrate is seen. No bony abnormality is noted. IMPRESSION: Chronic scarring bilaterally.  No acute abnormality seen. Electronically Signed   By: Inez Catalina M.D.   On: 12/02/2020 10:02   DG Chest Port 1 View  Result Date: 12/03/2020 CLINICAL DATA:  Shortness of breath and cough. EXAM: PORTABLE CHEST 1 VIEW COMPARISON:  December 02, 2020 FINDINGS: There is  an unchanged appearance of the right-sided ICD. The heart size is enlarged. The lung volumes are low. Linear airspace opacities are again noted bilaterally. There is no pneumothorax. There is no large pleural effusion. No acute osseous abnormality. There are multiple old left-sided rib fractures. IMPRESSION: No active disease. Electronically Signed   By: Constance Holster M.D.   On: 12/03/2020 02:23   CUP PACEART INCLINIC DEVICE CHECK  Result Date: 11/21/2020 ICD check in clinic. Normal device function. Thresholds and sensing consistent with previous device measurements. Impedance trends stable over time.  2 NSVT episode, seen on previous remotes, < 20 beats. Histogram distribution appropriate for patient and  level of activity. No changes made this session. Device programmed at appropriate safety margins. Device programmed to optimize intrinsic conduction. Estimated longevity 3 years. Pt enrolled in remote follow-up 12/31/20. Patient education completed. ROV w/ SK.Lavenia Atlas, BSN, RN   Barton Dubois, MD [facility]  If 7PM-7AM, please contact night-coverage [email or web address] Password [email or web address] 12/05/2020, 2:54 PM   LOS: 2 days

## 2020-12-05 NOTE — Progress Notes (Signed)
MD notified of patients critical INR of 4.16. No new orders obtained day shift nurse made aware.

## 2020-12-06 LAB — PROTIME-INR
INR: 3.1 — ABNORMAL HIGH (ref 0.8–1.2)
Prothrombin Time: 30.9 seconds — ABNORMAL HIGH (ref 11.4–15.2)

## 2020-12-06 LAB — CBC WITH DIFFERENTIAL/PLATELET
Abs Immature Granulocytes: 0.06 10*3/uL (ref 0.00–0.07)
Basophils Absolute: 0 10*3/uL (ref 0.0–0.1)
Basophils Relative: 0 %
Eosinophils Absolute: 0 10*3/uL (ref 0.0–0.5)
Eosinophils Relative: 0 %
HCT: 52.3 % — ABNORMAL HIGH (ref 39.0–52.0)
Hemoglobin: 16.3 g/dL (ref 13.0–17.0)
Immature Granulocytes: 0 %
Lymphocytes Relative: 11 %
Lymphs Abs: 1.5 10*3/uL (ref 0.7–4.0)
MCH: 29 pg (ref 26.0–34.0)
MCHC: 31.2 g/dL (ref 30.0–36.0)
MCV: 92.9 fL (ref 80.0–100.0)
Monocytes Absolute: 1.1 10*3/uL — ABNORMAL HIGH (ref 0.1–1.0)
Monocytes Relative: 8 %
Neutro Abs: 11.3 10*3/uL — ABNORMAL HIGH (ref 1.7–7.7)
Neutrophils Relative %: 81 %
Platelets: 385 10*3/uL (ref 150–400)
RBC: 5.63 MIL/uL (ref 4.22–5.81)
RDW: 16.4 % — ABNORMAL HIGH (ref 11.5–15.5)
WBC: 14 10*3/uL — ABNORMAL HIGH (ref 4.0–10.5)
nRBC: 0.1 % (ref 0.0–0.2)

## 2020-12-06 LAB — URINE CULTURE: Culture: NO GROWTH

## 2020-12-06 LAB — COMPREHENSIVE METABOLIC PANEL
ALT: 29 U/L (ref 0–44)
AST: 30 U/L (ref 15–41)
Albumin: 2.8 g/dL — ABNORMAL LOW (ref 3.5–5.0)
Alkaline Phosphatase: 99 U/L (ref 38–126)
Anion gap: 6 (ref 5–15)
BUN: 25 mg/dL — ABNORMAL HIGH (ref 8–23)
CO2: 36 mmol/L — ABNORMAL HIGH (ref 22–32)
Calcium: 9 mg/dL (ref 8.9–10.3)
Chloride: 96 mmol/L — ABNORMAL LOW (ref 98–111)
Creatinine, Ser: 0.77 mg/dL (ref 0.61–1.24)
GFR, Estimated: >60 mL/min (ref >60)
Glucose, Bld: 134 mg/dL — ABNORMAL HIGH (ref 70–99)
Potassium: 4.6 mmol/L (ref 3.5–5.1)
Sodium: 138 mmol/L (ref 135–145)
Total Bilirubin: 0.6 mg/dL (ref 0.3–1.2)
Total Protein: 6.4 g/dL — ABNORMAL LOW (ref 6.5–8.1)

## 2020-12-06 LAB — FERRITIN: Ferritin: 256 ng/mL (ref 24–336)

## 2020-12-06 LAB — D-DIMER, QUANTITATIVE: D-Dimer, Quant: 0.28 ug/mL-FEU (ref 0.00–0.50)

## 2020-12-06 LAB — C-REACTIVE PROTEIN: CRP: 0.6 mg/dL (ref <1.0)

## 2020-12-06 MED ORDER — WARFARIN SODIUM 1 MG PO TABS
1.0000 mg | ORAL_TABLET | Freq: Once | ORAL | Status: AC
  Administered 2020-12-06: 1 mg via ORAL
  Filled 2020-12-06: qty 1

## 2020-12-06 MED ORDER — IPRATROPIUM-ALBUTEROL 20-100 MCG/ACT IN AERS
1.0000 | INHALATION_SPRAY | Freq: Two times a day (BID) | RESPIRATORY_TRACT | Status: DC
  Administered 2020-12-06 – 2020-12-13 (×14): 1 via RESPIRATORY_TRACT

## 2020-12-06 NOTE — Progress Notes (Signed)
PROGRESS NOTE  Tayron Hunnell BHA:[id number] DOB: [birth year] DOA: 12/03/2020 PCP: Dettinger, Fransisca Kaufmann, MD  Brief History:  74 year old male with a history of dementia, systolic and diastolic CHF, DVT/PE on warfarin, COPD, V. fib arrest status post AICD, hypertension, ischemic cardiomyopathy, lung cancer, chronic respiratory failure on 3 L, and stroke presenting with 5 to 6-day history of increasing confusion, low-grade fever and progressive dyspnea on exertion.  The patient is unable to provide any history.  All of history is obtained from review of the medical record and speaking with patient's spouse.  The patient has had increasing somnolence and decreasing oral intake.  The patient spouse took him to urgent care on 11/29/20 at which time a UA was unremarkable.  Home Covid test on 12/01/2020 was negative.  However, the patient continued to have low-grade fevers up to 100.9 F at home.  In addition, the patient's spouse also noted his oxygen saturation running in the low 90s, 90-92% on his usual 3 L at home. In the emergency department, the patient was febrile up to 1 to 2.4 F with tachycardia.  Oxygen saturation was 94-96% on 4 L.  WBC 15.2, hemoglobin 15.1, platelets 349,000.  Assessment/Plan: Acute on chronic respiratory failure with hypoxia--secondary to COVID-19 pneumonia -Currently stable on 4-4.5 L nasal cannula -Patient is on 3 L nasal cannula at home -Continue to wean oxygen supplementation to baseline as tolerated.   -Continue vitamin C and zinc. -Continue treatment with steroids and remdesivir; day 4 out of 5. -CRP 2.4>>2.0>> 0.6 -D-dimer 0.54>>0.47>> 0.28 -Ferritin 185>>217>> 256 -PCT 0.11-->NEG; antibiotic has been discontinued.  Acute metabolic encephalopathy -Secondary to COVID-19 infection -12/2 UA--no pyuria -overall improved, near baseline now -Continue constant reorientation.  Systolic and diastolic CHF-chronic -Appears clinically compensated -03/03/2019  echo EF >60 percent -Continue to follow Daily weights -Continue furosemide -Follow strict I's and O's  COPD exacerbation -Intermittent wheezing has been described -Reporting intermittent coughing spells and very little difficulty speaking in full sentences currently. -Continue steroids and bronchodilator therapy.  Dementia without behavior disturbance -at high risk for hospital delirium -Continue constant reorientation and supportive care.    Coronary artery disease -No chest pain presently -Personally reviewed EKG--sinus rhythm, no ST-T wave change  Hyperlipidemia -Continue statin -Heart healthy diet has been encouraged.  History of DVT and PE -Pharmacy to dose  Essential hypertension -Continue metoprolol tartrate -Stable blood pressure.  Depression/anxiety -Continue Zoloft -Continue constant reorientation -Mood stable currently.  History of V. fib arrest January 2014/ischemic cardiomyopathy -Status post AICD -Continue telemetry monitoring -Patient denies palpitations.  Deconditioning -PT eval>>SNF -Family in agreement -Patient will most likely be clinically stable for discharge to skilled nursing facility on 12/07/2020.  Supratherapeutic INR -INR 3.1 today -Continue to follow trend -Pharmacy helping with dose adjustment.  Status is: Inpatient    Dispo: The patient is from: Home              Anticipated d/c is to: Home              Anticipated d/c date is: 1 day              Patient currently is not medically stable to d/c.  Still short of breath, generally weak/deconditioned requiring 4 L nasal cannula supplementation.  Still with intermittent coughing spells and reported short winded sensation with activity.  Patient will finish his remdesivir infusion on 12/07/2020; physical therapy has recommended skilled nursing facility and family in agreement.  TOC  is aware and working on placement.   Family Communication:   No family at bedside; patient's  wife was updated on 12/04/2020.  Consultants:  none  Code Status:  FULL  DVT Prophylaxis:  warfarin   Procedures: As Listed in Progress Note Above  Antibiotics: Ceftriaxone 12/6>>12/7 azithro 12/6>.12/7   Subjective: No fever, following commands appropriately and expressing no chest pain, no nausea, no vomiting.  Feeling weak and deconditioned.  Short winded with exertion and with intermittent coughing spells.  Patient is using 4 L nasal cannula supplementation at this time.  Objective: Vitals:   12/06/20 0222 12/06/20 0512 12/06/20 0700 12/06/20 0950  BP:  127/78  127/78  Pulse:  63  94  Resp:  18  20  Temp:  97.8 F (36.6 C)    TempSrc:  Oral    SpO2: 93% 93% 94% 94%  Weight:      Height:        Intake/Output Summary (Last 24 hours) at 12/06/2020 1439 Last data filed at 12/06/2020 0325 Gross per 24 hour  Intake 240 ml  Output 2650 ml  Net -2410 ml   Weight change:   Exam: General exam: Alert, awake, oriented x 2; in no acute distress.  Reports improvement in his breathing denies chest pain, nausea and vomiting.  Patient is following commands appropriately.  Feeling weak and deconditioned.  Some intermittent coughing spells and short winded sensation with activity has been reported. Respiratory system: Scattered rhonchi bilaterally; for movement without active wheezing at this time.  No using accessory muscle.  4 L nasal cannula in place. Cardiovascular system:RRR. No rubs or gallops.  No JVD. Gastrointestinal system: Abdomen is nondistended, soft and nontender. No organomegaly or masses felt. Normal bowel sounds heard. Central nervous system: Alert and oriented. No focal neurological deficits. Extremities: No cyanosis or clubbing. Skin: No petechiae. Psychiatry: Mood & affect appropriate.   Data Reviewed: I have personally reviewed following labs and imaging studies  Basic Metabolic Panel: Recent Labs  Lab 12/03/20 0139 12/04/20 0429 12/05/20 0414  12/06/20 0452  NA 136 139 138 138  K 4.0 4.8 4.5 4.6  CL 100 101 97* 96*  CO2 29 30 33* 36*  GLUCOSE 119* 114* 127* 134*  BUN 14 17 22  25*  CREATININE 0.82 0.68 0.73 0.77  CALCIUM 8.9 9.1 9.1 9.0   Liver Function Tests: Recent Labs  Lab 12/03/20 0139 12/04/20 0429 12/05/20 0414 12/06/20 0452  AST 28 38 33 30  ALT 24 25 28 29   ALKPHOS 120 110 104 99  BILITOT 0.4 0.4 0.3 0.6  PROT 7.0 7.0 6.6 6.4*  ALBUMIN 3.2* 3.1* 2.9* 2.8*   Coagulation Profile: Recent Labs  Lab 12/03/20 1354 12/04/20 0429 12/05/20 0414 12/06/20 0452  INR 2.8* 3.3* 4.2* 3.1*   CBC: Recent Labs  Lab 12/03/20 0139 12/04/20 0429 12/05/20 0414 12/06/20 0452  WBC 15.2* 13.2* 16.2* 14.0*  NEUTROABS 8.7* 10.4* 13.6* 11.3*  HGB 15.1 15.5 15.8 16.3  HCT 47.5 50.1 51.1 52.3*  MCV 90.8 92.8 92.9 92.9  PLT 349 357 379 385   Urine analysis:    Component Value Date/Time   COLORURINE YELLOW 12/04/2020 1700   APPEARANCEUR CLEAR 12/04/2020 1700   APPEARANCEUR Clear 07/13/2020 0000   LABSPEC 1.013 12/04/2020 1700   PHURINE 5.0 12/04/2020 1700   GLUCOSEU NEGATIVE 12/04/2020 1700   HGBUR NEGATIVE 12/04/2020 1700   BILIRUBINUR NEGATIVE 12/04/2020 1700   BILIRUBINUR negative 11/29/2020 1313   BILIRUBINUR Negative 07/13/2020 0000   KETONESUR NEGATIVE  12/04/2020 1700   PROTEINUR NEGATIVE 12/04/2020 1700   UROBILINOGEN 0.2 11/29/2020 1313   UROBILINOGEN 1.0 01/31/2013 1035   NITRITE NEGATIVE 12/04/2020 1700   LEUKOCYTESUR NEGATIVE 12/04/2020 1700   Sepsis Labs:  Recent Results (from the past 240 hour(s))  Urine Culture     Status: Abnormal   Collection Time: 11/29/20  1:13 PM   Specimen: Urine, Clean Catch  Result Value Ref Range Status   Specimen Description   Final    URINE, CLEAN CATCH Performed at [facility], [address] [place], [place] [address]    Special Requests   Final    NONE Performed at [facility], [address] [place], [place] [address]    Culture MULTIPLE SPECIES  PRESENT, SUGGEST RECOLLECTION (A)  Final   Report Status 12/01/2020 FINAL  Final  COVID-19, Flu A+B and RSV ([facility])     Status: Abnormal   Collection Time: 12/02/20 10:19 AM  Result Value Ref Range Status   SARS-CoV-2, NAA Detected (A) Not Detected Final    Comment: Patients who have a positive COVID-19 test result may now have treatment options. Treatment options are available for patients with mild to moderate symptoms and for hospitalized patients. Visit our website at [email or web address] for resources and information. This nucleic acid amplification test was developed and its performance characteristics determined by [employer]. Nucleic acid amplification tests include RT-PCR and TMA. This test has not been FDA cleared or approved. This test has been authorized by FDA under an Emergency Use Authorization (EUA). This test is only authorized for the duration of time the declaration that circumstances exist justifying the authorization of the emergency use of in vitro diagnostic tests for detection of SARS-CoV-2 virus and/or diagnosis of COVID-19 infection under section 564(b)(1) of the Act, 21 U.S.C. 701XBL-3(J) (1), unless the authorization is terminated or revoked sooner. When diagnostic testing is negativ e, the possibility of a false negative result should be considered in the context of a patient's recent exposures and the presence of clinical signs and symptoms consistent with COVID-19. An individual without symptoms of COVID-19 and who is not shedding SARS-CoV-2 virus would expect to have a negative (not detected) result in this assay.    Influenza A, NAA Not Detected Not Detected Final   Influenza B, NAA Not Detected Not Detected Final   RSV, NAA Not Detected Not Detected Final  Resp Panel by RT-PCR (Flu A&B, Covid) Nasopharyngeal Swab     Status: Abnormal   Collection Time: 12/03/20  1:59 AM   Specimen: Nasopharyngeal Swab; Nasopharyngeal(NP) swabs  in vial transport medium  Result Value Ref Range Status   SARS Coronavirus 2 by RT PCR POSITIVE (A) NEGATIVE Final    Comment: RESULT CALLED TO, READ BACK BY AND VERIFIED WITH: EASTER,T @ 0316 ON 12/03/20 BY JUW (NOTE) SARS-CoV-2 target nucleic acids are DETECTED.  The SARS-CoV-2 RNA is generally detectable in upper respiratory specimens during the acute phase of infection. Positive results are indicative of the presence of the identified virus, but do not rule out bacterial infection or co-infection with other pathogens not detected by the test. Clinical correlation with patient history and other diagnostic information is necessary to determine patient infection status. The expected result is Negative.  Fact Sheet for Patients: EntrepreneurPulse.com.au  Fact Sheet for Healthcare Providers: IncredibleEmployment.be  This test is not yet approved or cleared by the [place] FDA and  has been authorized for detection and/or diagnosis of SARS-CoV-2 by FDA under an Emergency Use Authorization (EUA).  This EUA will remain in effect (meaning this test can be  used) for the duration of  the COVID-19 declaration under Section 564(b)(1) of the Act, 21 U.S.C. section 360bbb-3(b)(1), unless the authorization is terminated or revoked sooner.     Influenza A by PCR NEGATIVE NEGATIVE Final   Influenza B by PCR NEGATIVE NEGATIVE Final    Comment: (NOTE) The Xpert Xpress SARS-CoV-2/FLU/RSV plus assay is intended as an aid in the diagnosis of influenza from Nasopharyngeal swab specimens and should not be used as a sole basis for treatment. Nasal washings and aspirates are unacceptable for Xpert Xpress SARS-CoV-2/FLU/RSV testing.  Fact Sheet for Patients: EntrepreneurPulse.com.au  Fact Sheet for Healthcare Providers: IncredibleEmployment.be  This test is not yet approved or cleared by the [place] FDA and has  been authorized for detection and/or diagnosis of SARS-CoV-2 by FDA under an Emergency Use Authorization (EUA). This EUA will remain in effect (meaning this test can be used) for the duration of the COVID-19 declaration under Section 564(b)(1) of the Act, 21 U.S.C. section 360bbb-3(b)(1), unless the authorization is terminated or revoked.  Performed at [facility], [address], [place], [place] [address]   Blood Culture (routine x 2)     Status: None (Preliminary result)   Collection Time: 12/03/20  2:58 AM   Specimen: Right Antecubital; Blood  Result Value Ref Range Status   Specimen Description RIGHT ANTECUBITAL  Final   Special Requests   Final    BOTTLES DRAWN AEROBIC ONLY Blood Culture results may not be optimal due to an inadequate volume of blood received in culture bottles   Culture   Final    NO GROWTH 3 DAYS Performed at [facility], [address], [place], [place] [address]    Report Status PENDING  Incomplete  Blood Culture (routine x 2)     Status: None (Preliminary result)   Collection Time: 12/03/20  3:45 AM   Specimen: BLOOD RIGHT HAND  Result Value Ref Range Status   Specimen Description BLOOD RIGHT HAND  Final   Special Requests   Final    BOTTLES DRAWN AEROBIC AND ANAEROBIC Blood Culture adequate volume   Culture   Final    NO GROWTH 3 DAYS Performed at [facility], [address], [place], [place] [address]    Report Status PENDING  Incomplete  Culture, Urine     Status: None   Collection Time: 12/04/20  5:00 PM   Specimen: Urine, Random  Result Value Ref Range Status   Specimen Description   Final    URINE, RANDOM Performed at [facility], [address] [place], [place] [address]    Special Requests   Final    NONE Performed at [facility], [address] [place], [place] [address]    Culture   Final    NO GROWTH Performed at [facility], [place] [address], [place], [place] [address]    Report Status 12/06/2020 FINAL  Final      Scheduled Meds: . aerochamber Z-Stat Plus/medium  1 each Other Once  . vitamin C  500 mg Oral Daily  . atorvastatin  80 mg Oral Q M,W,F  . cholecalciferol  2,000 Units Oral Daily  . dextromethorphan-guaiFENesin  1 tablet Oral BID  . finasteride  5 mg Oral Daily  . furosemide  20 mg Oral Daily  . Ipratropium-Albuterol  1 puff Inhalation BID  . methylPREDNISolone (SOLU-MEDROL) injection  60 mg Intravenous Q12H  . metoprolol tartrate  37.5 mg  Oral BID  . mirabegron ER  50 mg Oral Daily  . multivitamin with minerals  1 tablet Oral Daily  . sertraline  100 mg Oral Daily  . tamsulosin  0.4 mg Oral QPC supper  . warfarin  1 mg Oral ONCE-1600  . Warfarin - Pharmacist Dosing Inpatient   Does not apply q1600  . zinc sulfate  220 mg Oral Daily   Continuous Infusions: . remdesivir 100 mg in NS 100 mL 100 mg (12/06/20 1004)    Procedures/Studies: DG Chest 2 View  Result Date: 12/02/2020 CLINICAL DATA:  Cough and shortness of breath for 3 days EXAM: CHEST - 2 VIEW COMPARISON:  07/20/2020 FINDINGS: Cardiac shadow is enlarged but stable. Defibrillator is again noted and stable. Bilateral scarring is seen with interstitial prominence stable from the prior study. No new focal infiltrate is seen. No bony abnormality is noted. IMPRESSION: Chronic scarring bilaterally.  No acute abnormality seen. Electronically Signed   By: Inez Catalina M.D.   On: 12/02/2020 10:02   DG Chest Port 1 View  Result Date: 12/03/2020 CLINICAL DATA:  Shortness of breath and cough. EXAM: PORTABLE CHEST 1 VIEW COMPARISON:  December 02, 2020 FINDINGS: There is an unchanged appearance of the right-sided ICD. The heart size is enlarged. The lung volumes are low. Linear airspace opacities are again noted bilaterally. There is no pneumothorax. There is no large pleural effusion. No acute osseous abnormality. There are multiple old left-sided rib fractures. IMPRESSION: No active disease. Electronically Signed   By: Constance Holster  M.D.   On: 12/03/2020 02:23   CUP PACEART INCLINIC DEVICE CHECK  Result Date: 11/21/2020 ICD check in clinic. Normal device function. Thresholds and sensing consistent with previous device measurements. Impedance trends stable over time.  2 NSVT episode, seen on previous remotes, < 20 beats. Histogram distribution appropriate for patient and  level of activity. No changes made this session. Device programmed at appropriate safety margins. Device programmed to optimize intrinsic conduction. Estimated longevity 3 years. Pt enrolled in remote follow-up 12/31/20. Patient education completed. ROV w/ SK.Lavenia Atlas, BSN, RN   Barton Dubois, MD [facility]  If 7PM-7AM, please contact night-coverage [email or web address] Password [email or web address] 12/06/2020, 2:39 PM   LOS: 3 days
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
## 2020-12-06 NOTE — Progress Notes (Signed)
MD notified that patient had a 7 beat run of Vtach per central telemetry. vitals were obtained and as followed b/p 127/78 HR 63. Patient reports feeling fine. No new orders obtained at this time.

## 2020-12-07 LAB — CBC WITH DIFFERENTIAL/PLATELET
Abs Immature Granulocytes: 0.1 10*3/uL — ABNORMAL HIGH (ref 0.00–0.07)
Basophils Absolute: 0.1 10*3/uL (ref 0.0–0.1)
Basophils Relative: 0 %
Eosinophils Absolute: 0 10*3/uL (ref 0.0–0.5)
Eosinophils Relative: 0 %
HCT: 54.3 % — ABNORMAL HIGH (ref 39.0–52.0)
Hemoglobin: 17.2 g/dL — ABNORMAL HIGH (ref 13.0–17.0)
Immature Granulocytes: 1 %
Lymphocytes Relative: 13 %
Lymphs Abs: 1.7 10*3/uL (ref 0.7–4.0)
MCH: 29 pg (ref 26.0–34.0)
MCHC: 31.7 g/dL (ref 30.0–36.0)
MCV: 91.6 fL (ref 80.0–100.0)
Monocytes Absolute: 1.5 10*3/uL — ABNORMAL HIGH (ref 0.1–1.0)
Monocytes Relative: 11 %
Neutro Abs: 10.3 10*3/uL — ABNORMAL HIGH (ref 1.7–7.7)
Neutrophils Relative %: 75 %
Platelets: 405 10*3/uL — ABNORMAL HIGH (ref 150–400)
RBC: 5.93 MIL/uL — ABNORMAL HIGH (ref 4.22–5.81)
RDW: 16.7 % — ABNORMAL HIGH (ref 11.5–15.5)
WBC: 13.7 10*3/uL — ABNORMAL HIGH (ref 4.0–10.5)
nRBC: 0.1 % (ref 0.0–0.2)

## 2020-12-07 LAB — COMPREHENSIVE METABOLIC PANEL
ALT: 29 U/L (ref 0–44)
AST: 27 U/L (ref 15–41)
Albumin: 3.1 g/dL — ABNORMAL LOW (ref 3.5–5.0)
Alkaline Phosphatase: 99 U/L (ref 38–126)
Anion gap: 9 (ref 5–15)
BUN: 28 mg/dL — ABNORMAL HIGH (ref 8–23)
CO2: 36 mmol/L — ABNORMAL HIGH (ref 22–32)
Calcium: 9.2 mg/dL (ref 8.9–10.3)
Chloride: 94 mmol/L — ABNORMAL LOW (ref 98–111)
Creatinine, Ser: 0.8 mg/dL (ref 0.61–1.24)
GFR, Estimated: >60 mL/min (ref >60)
Glucose, Bld: 137 mg/dL — ABNORMAL HIGH (ref 70–99)
Potassium: 4.4 mmol/L (ref 3.5–5.1)
Sodium: 139 mmol/L (ref 135–145)
Total Bilirubin: 0.6 mg/dL (ref 0.3–1.2)
Total Protein: 6.8 g/dL (ref 6.5–8.1)

## 2020-12-07 LAB — PROTIME-INR
INR: 2.4 — ABNORMAL HIGH (ref 0.8–1.2)
Prothrombin Time: 25.2 seconds — ABNORMAL HIGH (ref 11.4–15.2)

## 2020-12-07 LAB — FERRITIN: Ferritin: 202 ng/mL (ref 24–336)

## 2020-12-07 LAB — D-DIMER, QUANTITATIVE: D-Dimer, Quant: 0.35 ug/mL-FEU (ref 0.00–0.50)

## 2020-12-07 LAB — C-REACTIVE PROTEIN: CRP: 0.5 mg/dL (ref <1.0)

## 2020-12-07 MED ORDER — WARFARIN SODIUM 2.5 MG PO TABS
2.5000 mg | ORAL_TABLET | Freq: Once | ORAL | Status: AC
  Administered 2020-12-07: 2.5 mg via ORAL
  Filled 2020-12-07: qty 1

## 2020-12-07 NOTE — Plan of Care (Signed)
  Problem: Education: Goal: Knowledge of General Education information will improve Description: Including pain rating scale, medication(s)/side effects and non-pharmacologic comfort measures 12/07/2020 1033 by Adam Phenix, RN Outcome: Progressing 12/07/2020 1032 by Adam Phenix, RN Outcome: Progressing   Problem: Health Behavior/Discharge Planning: Goal: Ability to manage health-related needs will improve 12/07/2020 1033 by Adam Phenix, RN Outcome: Progressing 12/07/2020 1032 by Adam Phenix, RN Outcome: Progressing   Problem: Clinical Measurements: Goal: Ability to maintain clinical measurements within normal limits will improve 12/07/2020 1033 by Adam Phenix, RN Outcome: Progressing 12/07/2020 1032 by Adam Phenix, RN Outcome: Progressing Goal: Will remain free from infection 12/07/2020 1033 by Adam Phenix, RN Outcome: Progressing 12/07/2020 1032 by Adam Phenix, RN Outcome: Progressing Goal: Diagnostic test results will improve 12/07/2020 1033 by Adam Phenix, RN Outcome: Progressing 12/07/2020 1032 by Adam Phenix, RN Outcome: Progressing Goal: Respiratory complications will improve 12/07/2020 1033 by Adam Phenix, RN Outcome: Progressing 12/07/2020 1032 by Adam Phenix, RN Outcome: Progressing Goal: Cardiovascular complication will be avoided 12/07/2020 1033 by Adam Phenix, RN Outcome: Progressing 12/07/2020 1032 by Adam Phenix, RN Outcome: Progressing   Problem: Activity: Goal: Risk for activity intolerance will decrease 12/07/2020 1033 by Adam Phenix, RN Outcome: Progressing 12/07/2020 1032 by Adam Phenix, RN Outcome: Progressing   Problem: Nutrition: Goal: Adequate nutrition will be maintained 12/07/2020 1033 by Adam Phenix, RN Outcome: Progressing 12/07/2020 1032 by Adam Phenix, RN Outcome:  Progressing   Problem: Coping: Goal: Level of anxiety will decrease 12/07/2020 1033 by Adam Phenix, RN Outcome: Progressing 12/07/2020 1032 by Adam Phenix, RN Outcome: Progressing   Problem: Elimination: Goal: Will not experience complications related to bowel motility 12/07/2020 1033 by Adam Phenix, RN Outcome: Progressing 12/07/2020 1032 by Adam Phenix, RN Outcome: Progressing Goal: Will not experience complications related to urinary retention 12/07/2020 1033 by Adam Phenix, RN Outcome: Progressing 12/07/2020 1032 by Adam Phenix, RN Outcome: Progressing   Problem: Pain Managment: Goal: General experience of comfort will improve 12/07/2020 1033 by Adam Phenix, RN Outcome: Progressing 12/07/2020 1032 by Adam Phenix, RN Outcome: Progressing   Problem: Safety: Goal: Ability to remain free from injury will improve 12/07/2020 1033 by Adam Phenix, RN Outcome: Progressing 12/07/2020 1032 by Adam Phenix, RN Outcome: Progressing   Problem: Skin Integrity: Goal: Risk for impaired skin integrity will decrease 12/07/2020 1033 by Adam Phenix, RN Outcome: Progressing 12/07/2020 1032 by Adam Phenix, RN Outcome: Progressing   Problem: Education: Goal: Knowledge of risk factors and measures for prevention of condition will improve 12/07/2020 1033 by Adam Phenix, RN Outcome: Progressing 12/07/2020 1032 by Adam Phenix, RN Outcome: Progressing   Problem: Coping: Goal: Psychosocial and spiritual needs will be supported 12/07/2020 1033 by Adam Phenix, RN Outcome: Progressing 12/07/2020 1032 by Adam Phenix, RN Outcome: Progressing   Problem: Respiratory: Goal: Will maintain a patent airway 12/07/2020 1033 by Adam Phenix, RN Outcome: Progressing 12/07/2020 1032 by Adam Phenix, RN Outcome: Progressing Goal:  Complications related to the disease process, condition or treatment will be avoided or minimized 12/07/2020 1033 by Adam Phenix, RN Outcome: Progressing 12/07/2020 1032 by Adam Phenix, RN Outcome: Progressing

## 2020-12-07 NOTE — Progress Notes (Signed)
Physical Therapy Treatment Patient Details Name: Travis Cruz MRN: [id number] DOB: 1946/11/06 Today's Date: 12/07/2020    History of Present Illness Travis Cruz is a 74 y.o. male with medical history of dementia, systolic and diastolic CHF, DVT/PE on warfarin, COPD, V. fib arrest status post AICD, hypertension, ischemic cardiomyopathy, lung cancer, chronic respiratory failure on 3 L, and stroke presenting with 5 to 6-day history of increasing confusion, low-grade fever and progressive dyspnea on exertion.  The patient is unable to provide any history.  All of history is obtained from review of the medical record and speaking with patient's spouse.  The patient has had increasing somnolence and decreasing oral intake.  The patient spouse took him to urgent care on 11/29/20 at which time a UA was unremarkable.  Home Covid test on 12/01/2020 was negative.  However, the patient continued to have low-grade fevers up to 100.9 F at home.  In addition, the patient's spouse also noted his oxygen saturation running in the low 90s, 90-92% on his usual 3 L at home.In the emergency department, the patient was febrile up to 102.4 F with tachycardia.  Oxygen saturation was 94-96% on 4 L.  WBC 15.2, hemoglobin 15.1, platelets 349,000.  BMP was essentially unremarkable with serum creatinine 0.82.  LFTs were unremarkable.  Chest x-ray showed right lower lobe, right lower lobe, left lower lobe opacities which have been chronic.  The patient was started on dexamethasone and remdesivir.    PT Comments    Patient demonstrates increased BUE strength for sitting up at bedside, very unsteady feet requiring extended time to transfer to chair due to poor carryover for following instructions and limited to a few steps at bedside due to fall risk and generalized weakness.  Patient tolerated sitting up in chair after therapy - RN aware.  Patient will benefit from continued physical therapy in hospital and recommended  venue below to increase strength, balance, endurance for safe ADLs and gait.    Follow Up Recommendations  SNF;Supervision for mobility/OOB;Supervision - Intermittent     Equipment Recommendations  None recommended by PT    Recommendations for Other Services       Precautions / Restrictions Precautions Precautions: Fall Restrictions Weight Bearing Restrictions: No    Mobility  Bed Mobility Overal bed mobility: Needs Assistance Bed Mobility: Supine to Sit     Supine to sit: Min guard;Min assist     General bed mobility comments: demonstrates increased strength for sitting up at bedside  Transfers Overall transfer level: Needs assistance Equipment used: Rolling walker (2 wheeled) Transfers: Sit to/from [facility] Sit to Stand: Min assist Stand pivot transfers: Mod assist       General transfer comment: slow labored movement  Ambulation/Gait Ambulation/Gait assistance: Mod assist Gait Distance (Feet): 5 Feet Assistive device: Rolling walker (2 wheeled) Gait Pattern/deviations: Decreased step length - right;Decreased step length - left;Decreased stride length Gait velocity: decreased   General Gait Details: limited to 5-6 slow labored steps at bedside due to weakness and fair/poor carryover for following instructions   Stairs             Wheelchair Mobility    Modified Rankin (Stroke Patients Only)       Balance Overall balance assessment: Needs assistance Sitting-balance support: Feet supported;No upper extremity supported Sitting balance-Leahy Scale: Fair Sitting balance - Comments: fair/good seated at EOB   Standing balance support: During functional activity;Bilateral upper extremity supported Standing balance-Leahy Scale: Poor Standing balance comment: fair/poor using RW  Cognition Arousal/Alertness: Awake/alert Behavior During Therapy: WFL for tasks assessed/performed Overall  Cognitive Status: History of cognitive impairments - at baseline                                        Exercises General Exercises - Lower Extremity Long Arc Quad: Seated;AROM;AAROM;Both;10 reps;Strengthening Heel Raises: Seated;AROM;Strengthening;Both;15 reps    General Comments        Pertinent Vitals/Pain Pain Assessment: No/denies pain    Home Living                      Prior Function            PT Goals (current goals can now be found in the care plan section) Acute Rehab PT Goals Patient Stated Goal: return home with family to assist PT Goal Formulation: With patient Time For Goal Achievement: 12/18/20 Potential to Achieve Goals: Good Progress towards PT goals: Progressing toward goals    Frequency    Min 3X/week      PT Plan      Co-evaluation              AM-PAC PT "6 Clicks" Mobility   Outcome Measure  Help needed turning from your back to your side while in a flat bed without using bedrails?: A Little Help needed moving from lying on your back to sitting on the side of a flat bed without using bedrails?: A Little Help needed moving to and from a bed to a chair (including a wheelchair)?: A Lot Help needed standing up from a chair using your arms (e.g., wheelchair or bedside chair)?: A Lot Help needed to walk in hospital room?: A Lot Help needed climbing 3-5 steps with a railing? : Total 6 Click Score: 13    End of Session Equipment Utilized During Treatment: Oxygen Activity Tolerance: Patient tolerated treatment well;Patient limited by fatigue Patient left: in chair;with call bell/phone within reach;with chair alarm set Nurse Communication: Mobility status PT Visit Diagnosis: Unsteadiness on feet (R26.81);Other abnormalities of gait and mobility (R26.89);Muscle weakness (generalized) (M62.81)     Time: 4967-5916 PT Time Calculation (min) (ACUTE ONLY): 26 min  Charges:  $Therapeutic Exercise: 8-22  mins $Therapeutic Activity: 8-22 mins                     3:36 PM, 12/07/20 [name], MPT Physical Therapist with [facility] [phone] office [phone] mobile phone

## 2020-12-07 NOTE — TOC Progression Note (Signed)
Transition of Care [facility]) - Progression Note    Patient Details  Name: Travis Cruz MRN: [id number] Date of Birth: [birth year]  Transition of [facility]) CM/SW Contact  Ihor Gully, LCSW Phone Number: 12/07/2020, 3:03 PM  Clinical Narrative:    TOC continues to seek placement at a facility. [facility] has no bed availability for the next 8 days. Referral made to [facility] as they are also accepting COVID patients.    Expected Discharge Plan: [place] Barriers to Discharge: Continued Medical Work up  Expected Discharge Plan and Services Expected Discharge Plan: [place] arrangements for the past 2 months: Single Family Home                                       Social Determinants of Health (SDOH) Interventions    Readmission Risk Interventions No flowsheet data found.

## 2020-12-07 NOTE — Progress Notes (Signed)
PROGRESS NOTE  Markanthony Gedney NTZ:[id number] DOB: 03/19/1946 DOA: 12/03/2020 PCP: Dettinger, Fransisca Kaufmann, MD  Brief History:  74 year old male with a history of dementia, systolic and diastolic CHF, DVT/PE on warfarin, COPD, V. fib arrest status post AICD, hypertension, ischemic cardiomyopathy, lung cancer, chronic respiratory failure on 3 L, and stroke presenting with 5 to 6-day history of increasing confusion, low-grade fever and progressive dyspnea on exertion.  The patient is unable to provide any history.  All of history is obtained from review of the medical record and speaking with patient's spouse.  The patient has had increasing somnolence and decreasing oral intake.  The patient spouse took him to urgent care on 11/29/20 at which time a UA was unremarkable.  Home Covid test on 12/01/2020 was negative.  However, the patient continued to have low-grade fevers up to 100.9 F at home.  In addition, the patient's spouse also noted his oxygen saturation running in the low 90s, 90-92% on his usual 3 L at home. In the emergency department, the patient was febrile up to 1 to 2.4 F with tachycardia.  Oxygen saturation was 94-96% on 4 L.  WBC 15.2, hemoglobin 15.1, platelets 349,000.  Assessment/Plan: Acute on chronic respiratory failure with hypoxia--secondary to COVID-19 pneumonia -Currently stable on 4-4.5 L nasal cannula -Patient is on 3 L nasal cannula at home -Continue to wean oxygen supplementation to baseline as tolerated.   -Continue vitamin C and zinc. -Continue treatment with steroids and remdesivir; day 5 out of 5. -CRP 2.4>>2.0>> 0.6 -D-dimer 0.54>>0.47>> 0.28 -Ferritin 185>>217>> 256 -PCT 0.11-->NEG; antibiotic has been discontinued.  Acute metabolic encephalopathy -Secondary to COVID-19 infection -12/2 UA--no pyuria -overall improved, near baseline now -Continue constant reorientation.  Systolic and diastolic CHF-chronic -Appears clinically compensated -03/03/2019  echo EF >60 percent -Continue to follow Daily weights -Continue furosemide -Follow strict I's and O's  COPD exacerbation -Intermittent wheezing has been described -Reporting intermittent coughing spells and very little difficulty speaking in full sentences currently. -Continue steroids and bronchodilator therapy.  Dementia without behavior disturbance -at high risk for hospital delirium -Continue constant reorientation and supportive care.    Coronary artery disease -No chest pain presently -Personally reviewed EKG--sinus rhythm, no ST-T wave change  Hyperlipidemia -Continue statin -Heart healthy diet has been encouraged.  History of DVT and PE -Pharmacy to dose  Essential hypertension -Continue metoprolol tartrate -Stable blood pressure.  Depression/anxiety -Continue Zoloft -Continue constant reorientation -Mood stable currently.  History of V. fib arrest January 2014/ischemic cardiomyopathy -Status post AICD -Continue telemetry monitoring -Patient denies palpitations.  Deconditioning -PT eval>>SNF -Family in agreement -Patient will most likely be clinically stable for discharge to skilled nursing facility on 12/07/2020.  Supratherapeutic INR -INR 3.1 today -Continue to follow trend -Pharmacy helping with dose adjustment.  Status is: Inpatient    Dispo: The patient is from: Home              Anticipated d/c is to: Home              Anticipated d/c date is: 1 day (waiting on insurance authorization/nursing home availability).              Patient currently medically stable for discharge to skilled nursing facility.  Still short of breath, generally weak/deconditioned requiring 3-4 L nasal cannula supplementation.  Still with intermittent coughing spells and reported short winded sensation with activity.  Patient will finish his remdesivir infusion today 12/07/2020; physical therapy has recommended skilled  nursing facility and family in agreement.  TOC  is aware and working on placement.   Family Communication:   No family at bedside; patient's wife was updated on 12/04/2020.  Consultants:  none  Code Status:  FULL  DVT Prophylaxis:  warfarin   Procedures: As Listed in Progress Note Above  Antibiotics: Ceftriaxone 12/6>>12/7 azithro 12/6>.12/7   Subjective: No fever, no chest pain, no nausea or vomiting.  Following commands appropriately. Feeling weak and tired.  Good oxygen saturation on 3-4 L; no acute distress.  Objective: Vitals:   12/06/20 2038 12/07/20 0449 12/07/20 0820 12/07/20 1407  BP:  135/86  132/77  Pulse:  73  72  Resp:  18  19  Temp:  98.8 F (37.1 C)  98.3 F (36.8 C)  TempSrc:    Oral  SpO2: 94% 93% 94% 93%  Weight:      Height:        Intake/Output Summary (Last 24 hours) at 12/07/2020 1548 Last data filed at 12/07/2020 1223 Gross per 24 hour  Intake 240 ml  Output 1100 ml  Net -860 ml   Weight change:   Exam: General exam: Alert, awake, oriented x 2; following commands and in no acute distress.  Patient is afebrile.  Expressed breathing to be better and have good oxygen saturation on 3-4 L nasal cannula supplementation.  Patient continues to feel weak and tired.  No other complaints. Respiratory system: Positive scattered rhonchi; no wheezing, no crackles. Cardiovascular system:RRR.  No rubs, no gallops; no JVD on examination. Gastrointestinal system: Abdomen is nondistended, soft and nontender. No organomegaly or masses felt. Normal bowel sounds heard. Central nervous system: Alert and oriented. No focal neurological deficits. Extremities: No cyanosis or clubbing. Skin: No petechiae. Psychiatry: Mood & affect appropriate.    Data Reviewed: I have personally reviewed following labs and imaging studies  Basic Metabolic Panel: Recent Labs  Lab 12/03/20 0139 12/04/20 0429 12/05/20 0414 12/06/20 0452 12/07/20 0609  NA 136 139 138 138 139  K 4.0 4.8 4.5 4.6 4.4  CL 100 101 97* 96*  94*  CO2 29 30 33* 36* 36*  GLUCOSE 119* 114* 127* 134* 137*  BUN 14 17 22  25* 28*  CREATININE 0.82 0.68 0.73 0.77 0.80  CALCIUM 8.9 9.1 9.1 9.0 9.2   Liver Function Tests: Recent Labs  Lab 12/03/20 0139 12/04/20 0429 12/05/20 0414 12/06/20 0452 12/07/20 0609  AST 28 38 33 30 27  ALT 24 25 28 29 29   ALKPHOS 120 110 104 99 99  BILITOT 0.4 0.4 0.3 0.6 0.6  PROT 7.0 7.0 6.6 6.4* 6.8  ALBUMIN 3.2* 3.1* 2.9* 2.8* 3.1*   Coagulation Profile: Recent Labs  Lab 12/03/20 1354 12/04/20 0429 12/05/20 0414 12/06/20 0452 12/07/20 0609  INR 2.8* 3.3* 4.2* 3.1* 2.4*   CBC: Recent Labs  Lab 12/03/20 0139 12/04/20 0429 12/05/20 0414 12/06/20 0452 12/07/20 0609  WBC 15.2* 13.2* 16.2* 14.0* 13.7*  NEUTROABS 8.7* 10.4* 13.6* 11.3* 10.3*  HGB 15.1 15.5 15.8 16.3 17.2*  HCT 47.5 50.1 51.1 52.3* 54.3*  MCV 90.8 92.8 92.9 92.9 91.6  PLT 349 357 379 385 405*   Urine analysis:    Component Value Date/Time   COLORURINE YELLOW 12/04/2020 1700   APPEARANCEUR CLEAR 12/04/2020 1700   APPEARANCEUR Clear 07/13/2020 0000   LABSPEC 1.013 12/04/2020 1700   PHURINE 5.0 12/04/2020 1700   GLUCOSEU NEGATIVE 12/04/2020 1700   HGBUR NEGATIVE 12/04/2020 1700   BILIRUBINUR NEGATIVE 12/04/2020 1700   BILIRUBINUR negative  11/29/2020 1313   BILIRUBINUR Negative 07/13/2020 0000   KETONESUR NEGATIVE 12/04/2020 1700   PROTEINUR NEGATIVE 12/04/2020 1700   UROBILINOGEN 0.2 11/29/2020 1313   UROBILINOGEN 1.0 01/31/2013 1035   NITRITE NEGATIVE 12/04/2020 1700   LEUKOCYTESUR NEGATIVE 12/04/2020 1700   Sepsis Labs:  Recent Results (from the past 240 hour(s))  Urine Culture     Status: Abnormal   Collection Time: 11/29/20  1:13 PM   Specimen: Urine, Clean Catch  Result Value Ref Range Status   Specimen Description   Final    URINE, CLEAN CATCH Performed at [facility], [address], [place], [place] [address]    Special Requests   Final    NONE Performed at [facility], [address],  [place], [place] [address]    Culture MULTIPLE SPECIES PRESENT, SUGGEST RECOLLECTION (A)  Final   Report Status 12/01/2020 FINAL  Final  COVID-19, Flu A+B and RSV ([facility])     Status: Abnormal   Collection Time: 12/02/20 10:19 AM  Result Value Ref Range Status   SARS-CoV-2, NAA Detected (A) Not Detected Final    Comment: Patients who have a positive COVID-19 test result may now have treatment options. Treatment options are available for patients with mild to moderate symptoms and for hospitalized patients. Visit our website at [email or web address] for resources and information. This nucleic acid amplification test was developed and its performance characteristics determined by [employer]. Nucleic acid amplification tests include RT-PCR and TMA. This test has not been FDA cleared or approved. This test has been authorized by FDA under an Emergency Use Authorization (EUA). This test is only authorized for the duration of time the declaration that circumstances exist justifying the authorization of the emergency use of in vitro diagnostic tests for detection of SARS-CoV-2 virus and/or diagnosis of COVID-19 infection under section 564(b)(1) of the Act, 21 U.S.C. 034VQQ-5(Z) (1), unless the authorization is terminated or revoked sooner. When diagnostic testing is negativ e, the possibility of a false negative result should be considered in the context of a patient's recent exposures and the presence of clinical signs and symptoms consistent with COVID-19. An individual without symptoms of COVID-19 and who is not shedding SARS-CoV-2 virus would expect to have a negative (not detected) result in this assay.    Influenza A, NAA Not Detected Not Detected Final   Influenza B, NAA Not Detected Not Detected Final   RSV, NAA Not Detected Not Detected Final  Resp Panel by RT-PCR (Flu A&B, Covid) Nasopharyngeal Swab     Status: Abnormal   Collection Time: 12/03/20  1:59 AM    Specimen: Nasopharyngeal Swab; Nasopharyngeal(NP) swabs in vial transport medium  Result Value Ref Range Status   SARS Coronavirus 2 by RT PCR POSITIVE (A) NEGATIVE Final    Comment: RESULT CALLED TO, READ BACK BY AND VERIFIED WITH: EASTER,T @ 0316 ON 12/03/20 BY JUW (NOTE) SARS-CoV-2 target nucleic acids are DETECTED.  The SARS-CoV-2 RNA is generally detectable in upper respiratory specimens during the acute phase of infection. Positive results are indicative of the presence of the identified virus, but do not rule out bacterial infection or co-infection with other pathogens not detected by the test. Clinical correlation with patient history and other diagnostic information is necessary to determine patient infection status. The expected result is Negative.  Fact Sheet for Patients: EntrepreneurPulse.com.au  Fact Sheet for Healthcare Providers: IncredibleEmployment.be  This test is not yet approved or cleared by the [place] FDA and  has been authorized for detection  and/or diagnosis of SARS-CoV-2 by FDA under an Emergency Use Authorization (EUA).  This EUA will remain in effect (meaning this test can be  used) for the duration of  the COVID-19 declaration under Section 564(b)(1) of the Act, 21 U.S.C. section 360bbb-3(b)(1), unless the authorization is terminated or revoked sooner.     Influenza A by PCR NEGATIVE NEGATIVE Final   Influenza B by PCR NEGATIVE NEGATIVE Final    Comment: (NOTE) The Xpert Xpress SARS-CoV-2/FLU/RSV plus assay is intended as an aid in the diagnosis of influenza from Nasopharyngeal swab specimens and should not be used as a sole basis for treatment. Nasal washings and aspirates are unacceptable for Xpert Xpress SARS-CoV-2/FLU/RSV testing.  Fact Sheet for Patients: EntrepreneurPulse.com.au  Fact Sheet for Healthcare Providers: IncredibleEmployment.be  This test is not yet  approved or cleared by the [place] FDA and has been authorized for detection and/or diagnosis of SARS-CoV-2 by FDA under an Emergency Use Authorization (EUA). This EUA will remain in effect (meaning this test can be used) for the duration of the COVID-19 declaration under Section 564(b)(1) of the Act, 21 U.S.C. section 360bbb-3(b)(1), unless the authorization is terminated or revoked.  Performed at [facility], [address], [place], [place] [address]   Blood Culture (routine x 2)     Status: None (Preliminary result)   Collection Time: 12/03/20  2:58 AM   Specimen: Right Antecubital; Blood  Result Value Ref Range Status   Specimen Description RIGHT ANTECUBITAL  Final   Special Requests   Final    BOTTLES DRAWN AEROBIC ONLY Blood Culture results may not be optimal due to an inadequate volume of blood received in culture bottles   Culture   Final    NO GROWTH 4 DAYS Performed at [facility], [address], [place], [place] [address]    Report Status PENDING  Incomplete  Blood Culture (routine x 2)     Status: None (Preliminary result)   Collection Time: 12/03/20  3:45 AM   Specimen: BLOOD RIGHT HAND  Result Value Ref Range Status   Specimen Description BLOOD RIGHT HAND  Final   Special Requests   Final    BOTTLES DRAWN AEROBIC AND ANAEROBIC Blood Culture adequate volume   Culture   Final    NO GROWTH 4 DAYS Performed at [facility], [address], [place], [place] [address]    Report Status PENDING  Incomplete  Culture, Urine     Status: None   Collection Time: 12/04/20  5:00 PM   Specimen: Urine, Random  Result Value Ref Range Status   Specimen Description   Final    URINE, RANDOM Performed at [facility], [address], [place], [place] [address]    Special Requests   Final    NONE Performed at [facility], [address], [place], [place] [address]    Culture   Final    NO GROWTH Performed at [facility], [place] [address] [place],  [address]     Report Status 12/06/2020 FINAL  Final     Scheduled Meds: . aerochamber Z-Stat Plus/medium  1 each Other Once  . vitamin C  500 mg Oral Daily  . atorvastatin  80 mg Oral Q M,W,F  . cholecalciferol  2,000 Units Oral Daily  . dextromethorphan-guaiFENesin  1 tablet Oral BID  . finasteride  5 mg Oral Daily  . furosemide  20 mg Oral Daily  . Ipratropium-Albuterol  1 puff Inhalation BID  . methylPREDNISolone (SOLU-MEDROL)  injection  60 mg Intravenous Q12H  . metoprolol tartrate  37.5 mg Oral BID  . mirabegron ER  50 mg Oral Daily  . multivitamin with minerals  1 tablet Oral Daily  . sertraline  100 mg Oral Daily  . tamsulosin  0.4 mg Oral QPC supper  . warfarin  2.5 mg Oral ONCE-1600  . Warfarin - Pharmacist Dosing Inpatient   Does not apply q1600  . zinc sulfate  220 mg Oral Daily   Continuous Infusions:   Procedures/Studies: DG Chest 2 View  Result Date: 12/02/2020 CLINICAL DATA:  Cough and shortness of breath for 3 days EXAM: CHEST - 2 VIEW COMPARISON:  07/20/2020 FINDINGS: Cardiac shadow is enlarged but stable. Defibrillator is again noted and stable. Bilateral scarring is seen with interstitial prominence stable from the prior study. No new focal infiltrate is seen. No bony abnormality is noted. IMPRESSION: Chronic scarring bilaterally.  No acute abnormality seen. Electronically Signed   By: Inez Catalina M.D.   On: 12/02/2020 10:02   DG Chest Port 1 View  Result Date: 12/03/2020 CLINICAL DATA:  Shortness of breath and cough. EXAM: PORTABLE CHEST 1 VIEW COMPARISON:  December 02, 2020 FINDINGS: There is an unchanged appearance of the right-sided ICD. The heart size is enlarged. The lung volumes are low. Linear airspace opacities are again noted bilaterally. There is no pneumothorax. There is no large pleural effusion. No acute osseous abnormality. There are multiple old left-sided rib fractures. IMPRESSION: No active disease. Electronically Signed   By: Constance Holster M.D.   On:  12/03/2020 02:23   CUP PACEART INCLINIC DEVICE CHECK  Result Date: 11/21/2020 ICD check in clinic. Normal device function. Thresholds and sensing consistent with previous device measurements. Impedance trends stable over time.  2 NSVT episode, seen on previous remotes, < 20 beats. Histogram distribution appropriate for patient and  level of activity. No changes made this session. Device programmed at appropriate safety margins. Device programmed to optimize intrinsic conduction. Estimated longevity 3 years. Pt enrolled in remote follow-up 12/31/20. Patient education completed. ROV w/ SK.Lavenia Atlas, BSN, RN   Barton Dubois, MD [facility]  If 7PM-7AM, please contact night-coverage [email or web address] Password [email or web address] 12/07/2020, 3:48 PM   LOS: 4 days
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
## 2020-12-07 NOTE — Plan of Care (Signed)
  Problem: Education: Goal: Knowledge of General Education information will improve Description: Including pain rating scale, medication(s)/side effects and non-pharmacologic comfort measures Outcome: Not Progressing   Problem: Health Behavior/Discharge Planning: Goal: Ability to manage health-related needs will improve Outcome: Not Progressing   Problem: Nutrition: Goal: Adequate nutrition will be maintained Outcome: Not Progressing   Problem: Education: Goal: Knowledge of risk factors and measures for prevention of condition will improve Outcome: Not Progressing

## 2020-12-07 NOTE — Plan of Care (Signed)

## 2020-12-08 LAB — CBC WITH DIFFERENTIAL/PLATELET
Abs Immature Granulocytes: 0.15 10*3/uL — ABNORMAL HIGH (ref 0.00–0.07)
Basophils Absolute: 0.1 10*3/uL (ref 0.0–0.1)
Basophils Relative: 0 %
Eosinophils Absolute: 0 10*3/uL (ref 0.0–0.5)
Eosinophils Relative: 0 %
HCT: 55 % — ABNORMAL HIGH (ref 39.0–52.0)
Hemoglobin: 17.4 g/dL — ABNORMAL HIGH (ref 13.0–17.0)
Immature Granulocytes: 1 %
Lymphocytes Relative: 9 %
Lymphs Abs: 1.5 10*3/uL (ref 0.7–4.0)
MCH: 28.8 pg (ref 26.0–34.0)
MCHC: 31.6 g/dL (ref 30.0–36.0)
MCV: 91.1 fL (ref 80.0–100.0)
Monocytes Absolute: 1.6 10*3/uL — ABNORMAL HIGH (ref 0.1–1.0)
Monocytes Relative: 10 %
Neutro Abs: 12.6 10*3/uL — ABNORMAL HIGH (ref 1.7–7.7)
Neutrophils Relative %: 80 %
Platelets: 420 10*3/uL — ABNORMAL HIGH (ref 150–400)
RBC: 6.04 MIL/uL — ABNORMAL HIGH (ref 4.22–5.81)
RDW: 16.9 % — ABNORMAL HIGH (ref 11.5–15.5)
WBC: 15.9 10*3/uL — ABNORMAL HIGH (ref 4.0–10.5)
nRBC: 0 % (ref 0.0–0.2)

## 2020-12-08 LAB — COMPREHENSIVE METABOLIC PANEL
ALT: 28 U/L (ref 0–44)
AST: 25 U/L (ref 15–41)
Albumin: 3 g/dL — ABNORMAL LOW (ref 3.5–5.0)
Alkaline Phosphatase: 98 U/L (ref 38–126)
Anion gap: 7 (ref 5–15)
BUN: 35 mg/dL — ABNORMAL HIGH (ref 8–23)
CO2: 34 mmol/L — ABNORMAL HIGH (ref 22–32)
Calcium: 8.9 mg/dL (ref 8.9–10.3)
Chloride: 98 mmol/L (ref 98–111)
Creatinine, Ser: 0.83 mg/dL (ref 0.61–1.24)
GFR, Estimated: >60 mL/min (ref >60)
Glucose, Bld: 150 mg/dL — ABNORMAL HIGH (ref 70–99)
Potassium: 4.5 mmol/L (ref 3.5–5.1)
Sodium: 139 mmol/L (ref 135–145)
Total Bilirubin: 0.4 mg/dL (ref 0.3–1.2)
Total Protein: 6.6 g/dL (ref 6.5–8.1)

## 2020-12-08 LAB — CULTURE, BLOOD (ROUTINE X 2)
Culture: NO GROWTH
Culture: NO GROWTH
Special Requests: ADEQUATE

## 2020-12-08 LAB — D-DIMER, QUANTITATIVE: D-Dimer, Quant: <0.27 ug/mL-FEU (ref 0.00–0.50)

## 2020-12-08 LAB — PROTIME-INR
INR: 2.8 — ABNORMAL HIGH (ref 0.8–1.2)
Prothrombin Time: 28.2 seconds — ABNORMAL HIGH (ref 11.4–15.2)

## 2020-12-08 LAB — FERRITIN: Ferritin: 188 ng/mL (ref 24–336)

## 2020-12-08 LAB — C-REACTIVE PROTEIN: CRP: <0.5 mg/dL (ref <1.0)

## 2020-12-08 MED ORDER — WARFARIN SODIUM 1 MG PO TABS
1.5000 mg | ORAL_TABLET | Freq: Once | ORAL | Status: AC
  Administered 2020-12-08: 1.5 mg via ORAL
  Filled 2020-12-08: qty 1
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
## 2020-12-08 NOTE — Progress Notes (Signed)
PROGRESS NOTE  Karla Vines QIO:[id number] DOB: 03-26-1946 DOA: 12/03/2020 PCP: Dettinger, Fransisca Kaufmann, MD  Brief History:  74 year old male with a history of dementia, systolic and diastolic CHF, DVT/PE on warfarin, COPD, V. fib arrest status post AICD, hypertension, ischemic cardiomyopathy, lung cancer, chronic respiratory failure on 3 L, and stroke presenting with 5 to 6-day history of increasing confusion, low-grade fever and progressive dyspnea on exertion.  The patient is unable to provide any history.  All of history is obtained from review of the medical record and speaking with patient's spouse.  The patient has had increasing somnolence and decreasing oral intake.  The patient spouse took him to urgent care on 11/29/20 at which time a UA was unremarkable.  Home Covid test on 12/01/2020 was negative.  However, the patient continued to have low-grade fevers up to 100.9 F at home.  In addition, the patient's spouse also noted his oxygen saturation running in the low 90s, 90-92% on his usual 3 L at home. In the emergency department, the patient was febrile up to 1 to 2.4 F with tachycardia.  Oxygen saturation was 94-96% on 4 L.  WBC 15.2, hemoglobin 15.1, platelets 349,000.  Assessment/Plan: Acute on chronic respiratory failure with hypoxia--secondary to COVID-19 pneumonia -Currently back to his chronic 3 L nasal cannula supplementation with good saturation -Still with intermittent coughing spells (no sputum production); feeling short winded with activity. -Patient has completed 5 days of remdesivir infusion on 12/07/2020 -Will continue oral steroids; with anticipation to start tapering process on 12/09/2020. -Continue vitamin C and zinc. -PCT 0.11-->NEG; antibiotic has been discontinued. -Continue supportive care, incentive respirometer, flutter valve and as needed antitussive medication.  Acute metabolic encephalopathy -Secondary to COVID-19 infection -12/2 UA--no  pyuria -overall improved, near baseline now -Continue constant reorientation.  Systolic and diastolic CHF-chronic -Appears clinically compensated -03/03/2019 echo EF >60 percent -Continue to follow Daily weights -Continue furosemide -Follow strict I's and O's  COPD exacerbation -Intermittent wheezing has been described -Reporting intermittent coughing spells and very little difficulty speaking in full sentences currently. -Continue steroids and bronchodilator therapy.  Dementia without behavior disturbance -at high risk for hospital delirium -Continue constant reorientation and supportive care.    Coronary artery disease -No chest pain presently -Personally reviewed EKG--sinus rhythm, no ST-T wave change  Hyperlipidemia -Continue statin -Heart healthy diet has been encouraged.  History of DVT and PE -Pharmacy to dose  Essential hypertension -Continue metoprolol tartrate -Stable blood pressure.  Depression/anxiety -Continue Zoloft -Continue constant reorientation -Mood stable currently.  History of V. fib arrest January 2014/ischemic cardiomyopathy -Status post AICD -Continue telemetry monitoring -Patient denies palpitations.  Deconditioning -PT eval>>SNF -Family in agreement -Patient will most likely be clinically stable for discharge to skilled nursing facility on 12/07/2020.  Supratherapeutic INR -INR 3.1 today -Continue to follow trend -Pharmacy helping with dose adjustment.  Status is: Inpatient    Dispo: The patient is from: Home              Anticipated d/c is to: Home              Anticipated d/c date is: 1 day (waiting on insurance authorization/nursing home availability).              Patient currently medically stable for discharge to skilled nursing facility.  Still mildly short of breath with exertion; but down to his baseline 3 L chronic oxygen supplementation.  Patient is in no distress.  He has  completed 5 days of remdesivir infusion  and is tolerating oral steroids with anticipated tapering plan. TOC assisting with patient's discharge.  Family Communication:   No family at bedside; patient's wife was updated on 12/04/2020.  Consultants:  none  Code Status:  FULL  DVT Prophylaxis:  warfarin   Procedures: As Listed in Progress Note Above  Antibiotics: Ceftriaxone 12/6>>12/7 azithro 12/6>.12/7   Subjective: No fever, no chest pain, no nausea, no vomiting.  Following commands appropriately and expressing improvement in his breathing.  Right now back to his baseline of 3 L nasal cannula supplementation.  Expressed feeling weak and deconditioned.  Objective: Vitals:   12/08/20 0536 12/08/20 0713 12/08/20 0927 12/08/20 0931  BP: 132/64   124/82  Pulse: 75  85   Resp: 18     Temp: 98 F (36.7 C)     TempSrc:      SpO2: 93% 94% 95%   Weight:      Height:        Intake/Output Summary (Last 24 hours) at 12/08/2020 1123 Last data filed at 12/08/2020 0941 Gross per 24 hour  Intake 460 ml  Output 1000 ml  Net -540 ml   Weight change:   Exam: General exam: Alert, awake, oriented x 2; following commands appropriately, reporting improvement in his breathing.  No chest pain, no nausea, no vomiting, no fever.  Patient expressed feeling weak and deconditioned. Respiratory system: Improved air movement bilaterally; positive rhonchi, no wheezing, no crackles. Cardiovascular system:RRR. No murmurs, rubs or gallops appreciated.  No JVD Gastrointestinal system: Abdomen is nondistended, soft and nontender. No organomegaly or masses felt. Normal bowel sounds heard. Central nervous system: No focal neurological deficits. Extremities: No cyanosis or clubbing. Skin: No petechiae Psychiatry: Mood & affect appropriate.    Data Reviewed: I have personally reviewed following labs and imaging studies  Basic Metabolic Panel: Recent Labs  Lab 12/04/20 0429 12/05/20 0414 12/06/20 0452 12/07/20 0609 12/08/20 0752  NA  139 138 138 139 139  K 4.8 4.5 4.6 4.4 4.5  CL 101 97* 96* 94* 98  CO2 30 33* 36* 36* 34*  GLUCOSE 114* 127* 134* 137* 150*  BUN 17 22 25* 28* 35*  CREATININE 0.68 0.73 0.77 0.80 0.83  CALCIUM 9.1 9.1 9.0 9.2 8.9   Liver Function Tests: Recent Labs  Lab 12/04/20 0429 12/05/20 0414 12/06/20 0452 12/07/20 0609 12/08/20 0752  AST 38 33 30 27 25   ALT 25 28 29 29 28   ALKPHOS 110 104 99 99 98  BILITOT 0.4 0.3 0.6 0.6 0.4  PROT 7.0 6.6 6.4* 6.8 6.6  ALBUMIN 3.1* 2.9* 2.8* 3.1* 3.0*   Coagulation Profile: Recent Labs  Lab 12/04/20 0429 12/05/20 0414 12/06/20 0452 12/07/20 0609 12/08/20 0752  INR 3.3* 4.2* 3.1* 2.4* 2.8*   CBC: Recent Labs  Lab 12/04/20 0429 12/05/20 0414 12/06/20 0452 12/07/20 0609 12/08/20 0752  WBC 13.2* 16.2* 14.0* 13.7* 15.9*  NEUTROABS 10.4* 13.6* 11.3* 10.3* 12.6*  HGB 15.5 15.8 16.3 17.2* 17.4*  HCT 50.1 51.1 52.3* 54.3* 55.0*  MCV 92.8 92.9 92.9 91.6 91.1  PLT 357 379 385 405* 420*   Urine analysis:    Component Value Date/Time   COLORURINE YELLOW 12/04/2020 1700   APPEARANCEUR CLEAR 12/04/2020 1700   APPEARANCEUR Clear 07/13/2020 0000   LABSPEC 1.013 12/04/2020 1700   PHURINE 5.0 12/04/2020 1700   GLUCOSEU NEGATIVE 12/04/2020 1700   HGBUR NEGATIVE 12/04/2020 1700   BILIRUBINUR NEGATIVE 12/04/2020 1700   BILIRUBINUR negative 11/29/2020 1313  BILIRUBINUR Negative 07/13/2020 0000   KETONESUR NEGATIVE 12/04/2020 1700   PROTEINUR NEGATIVE 12/04/2020 1700   UROBILINOGEN 0.2 11/29/2020 1313   UROBILINOGEN 1.0 01/31/2013 1035   NITRITE NEGATIVE 12/04/2020 1700   LEUKOCYTESUR NEGATIVE 12/04/2020 1700   Sepsis Labs:  Recent Results (from the past 240 hour(s))  Urine Culture     Status: Abnormal   Collection Time: 11/29/20  1:13 PM   Specimen: Urine, Clean Catch  Result Value Ref Range Status   Specimen Description   Final    URINE, CLEAN CATCH Performed at [facility], [address], [place], [place] [address]    Special  Requests   Final    NONE Performed at [facility], [address], [place], [place] [address]    Culture MULTIPLE SPECIES PRESENT, SUGGEST RECOLLECTION (A)  Final   Report Status 12/01/2020 FINAL  Final  COVID-19, Flu A+B and RSV ([facility])     Status: Abnormal   Collection Time: 12/02/20 10:19 AM  Result Value Ref Range Status   SARS-CoV-2, NAA Detected (A) Not Detected Final    Comment: Patients who have a positive COVID-19 test result may now have treatment options. Treatment options are available for patients with mild to moderate symptoms and for hospitalized patients. Visit our website at [email or web address] for resources and information. This nucleic acid amplification test was developed and its performance characteristics determined by [employer]. Nucleic acid amplification tests include RT-PCR and TMA. This test has not been FDA cleared or approved. This test has been authorized by FDA under an Emergency Use Authorization (EUA). This test is only authorized for the duration of time the declaration that circumstances exist justifying the authorization of the emergency use of in vitro diagnostic tests for detection of SARS-CoV-2 virus and/or diagnosis of COVID-19 infection under section 564(b)(1) of the Act, 21 U.S.C. 932IZT-2(W) (1), unless the authorization is terminated or revoked sooner. When diagnostic testing is negativ e, the possibility of a false negative result should be considered in the context of a patient's recent exposures and the presence of clinical signs and symptoms consistent with COVID-19. An individual without symptoms of COVID-19 and who is not shedding SARS-CoV-2 virus would expect to have a negative (not detected) result in this assay.    Influenza A, NAA Not Detected Not Detected Final   Influenza B, NAA Not Detected Not Detected Final   RSV, NAA Not Detected Not Detected Final  Resp Panel by RT-PCR (Flu A&B, Covid)  Nasopharyngeal Swab     Status: Abnormal   Collection Time: 12/03/20  1:59 AM   Specimen: Nasopharyngeal Swab; Nasopharyngeal(NP) swabs in vial transport medium  Result Value Ref Range Status   SARS Coronavirus 2 by RT PCR POSITIVE (A) NEGATIVE Final    Comment: RESULT CALLED TO, READ BACK BY AND VERIFIED WITH: EASTER,T @ 0316 ON 12/03/20 BY JUW (NOTE) SARS-CoV-2 target nucleic acids are DETECTED.  The SARS-CoV-2 RNA is generally detectable in upper respiratory specimens during the acute phase of infection. Positive results are indicative of the presence of the identified virus, but do not rule out bacterial infection or co-infection with other pathogens not detected by the test. Clinical correlation with patient history and other diagnostic information is necessary to determine patient infection status. The expected result is Negative.  Fact Sheet for Patients: EntrepreneurPulse.com.au  Fact Sheet for Healthcare Providers: IncredibleEmployment.be  This test is not yet approved or cleared by the [place] FDA and  has been authorized for detection and/or diagnosis of SARS-CoV-2  by FDA under an Emergency Use Authorization (EUA).  This EUA will remain in effect (meaning this test can be  used) for the duration of  the COVID-19 declaration under Section 564(b)(1) of the Act, 21 U.S.C. section 360bbb-3(b)(1), unless the authorization is terminated or revoked sooner.     Influenza A by PCR NEGATIVE NEGATIVE Final   Influenza B by PCR NEGATIVE NEGATIVE Final    Comment: (NOTE) The Xpert Xpress SARS-CoV-2/FLU/RSV plus assay is intended as an aid in the diagnosis of influenza from Nasopharyngeal swab specimens and should not be used as a sole basis for treatment. Nasal washings and aspirates are unacceptable for Xpert Xpress SARS-CoV-2/FLU/RSV testing.  Fact Sheet for Patients: EntrepreneurPulse.com.au  Fact Sheet for  Healthcare Providers: IncredibleEmployment.be  This test is not yet approved or cleared by the [place] FDA and has been authorized for detection and/or diagnosis of SARS-CoV-2 by FDA under an Emergency Use Authorization (EUA). This EUA will remain in effect (meaning this test can be used) for the duration of the COVID-19 declaration under Section 564(b)(1) of the Act, 21 U.S.C. section 360bbb-3(b)(1), unless the authorization is terminated or revoked.  Performed at [facility], [address], [place], [place] [address]   Blood Culture (routine x 2)     Status: None   Collection Time: 12/03/20  2:58 AM   Specimen: Right Antecubital; Blood  Result Value Ref Range Status   Specimen Description RIGHT ANTECUBITAL  Final   Special Requests   Final    BOTTLES DRAWN AEROBIC ONLY Blood Culture results may not be optimal due to an inadequate volume of blood received in culture bottles   Culture   Final    NO GROWTH 5 DAYS Performed at [facility], [address], [place], [place] [address]    Report Status 12/08/2020 FINAL  Final  Blood Culture (routine x 2)     Status: None   Collection Time: 12/03/20  3:45 AM   Specimen: BLOOD RIGHT HAND  Result Value Ref Range Status   Specimen Description BLOOD RIGHT HAND  Final   Special Requests   Final    BOTTLES DRAWN AEROBIC AND ANAEROBIC Blood Culture adequate volume   Culture   Final    NO GROWTH 5 DAYS Performed at [facility], [address] [place], [place] [address]    Report Status 12/08/2020 FINAL  Final  Culture, Urine     Status: None   Collection Time: 12/04/20  5:00 PM   Specimen: Urine, Random  Result Value Ref Range Status   Specimen Description   Final    URINE, RANDOM Performed at [facility], [address], [place], [place] [address]    Special Requests   Final    NONE Performed at [facility], [address], [place], [place] [address]    Culture   Final    NO GROWTH Performed at [facility], [place] [address], [place], [place] [address]    Report Status 12/06/2020 FINAL  Final     Scheduled Meds: . aerochamber Z-Stat Plus/medium  1 each Other Once  . vitamin C  500 mg Oral Daily  . atorvastatin  80 mg Oral Q M,W,F  . cholecalciferol  2,000 Units Oral Daily  . dextromethorphan-guaiFENesin  1 tablet Oral BID  . finasteride  5 mg Oral Daily  . furosemide  20 mg Oral Daily  . Ipratropium-Albuterol  1 puff Inhalation BID  . methylPREDNISolone (SOLU-MEDROL) injection  60 mg Intravenous Q12H  .  metoprolol tartrate  37.5 mg Oral BID  . mirabegron ER  50 mg Oral Daily  . multivitamin with minerals  1 tablet Oral Daily  . sertraline  100 mg Oral Daily  . tamsulosin  0.4 mg Oral QPC supper  . warfarin  1.5 mg Oral ONCE-1600  . Warfarin - Pharmacist Dosing Inpatient   Does not apply q1600  . zinc sulfate  220 mg Oral Daily   Continuous Infusions:   Procedures/Studies: DG Chest 2 View  Result Date: 12/02/2020 CLINICAL DATA:  Cough and shortness of breath for 3 days EXAM: CHEST - 2 VIEW COMPARISON:  07/20/2020 FINDINGS: Cardiac shadow is enlarged but stable. Defibrillator is again noted and stable. Bilateral scarring is seen with interstitial prominence stable from the prior study. No new focal infiltrate is seen. No bony abnormality is noted. IMPRESSION: Chronic scarring bilaterally.  No acute abnormality seen. Electronically Signed   By: Inez Catalina M.D.   On: 12/02/2020 10:02   DG Chest Port 1 View  Result Date: 12/03/2020 CLINICAL DATA:  Shortness of breath and cough. EXAM: PORTABLE CHEST 1 VIEW COMPARISON:  December 02, 2020 FINDINGS: There is an unchanged appearance of the right-sided ICD. The heart size is enlarged. The lung volumes are low. Linear airspace opacities are again noted bilaterally. There is no pneumothorax. There is no large pleural effusion. No acute osseous abnormality. There are multiple old left-sided rib fractures. IMPRESSION: No active disease.  Electronically Signed   By: Constance Holster M.D.   On: 12/03/2020 02:23   CUP PACEART INCLINIC DEVICE CHECK  Result Date: 11/21/2020 ICD check in clinic. Normal device function. Thresholds and sensing consistent with previous device measurements. Impedance trends stable over time.  2 NSVT episode, seen on previous remotes, < 20 beats. Histogram distribution appropriate for patient and  level of activity. No changes made this session. Device programmed at appropriate safety margins. Device programmed to optimize intrinsic conduction. Estimated longevity 3 years. Pt enrolled in remote follow-up 12/31/20. Patient education completed. ROV w/ SK.Lavenia Atlas, BSN, RN   Barton Dubois, MD [facility]   12/08/2020, 11:23 AM   LOS: 5 days

## 2020-12-08 NOTE — Plan of Care (Signed)

## 2020-12-09 LAB — PROTIME-INR
INR: 3 — ABNORMAL HIGH (ref 0.8–1.2)
Prothrombin Time: 30.5 seconds — ABNORMAL HIGH (ref 11.4–15.2)

## 2020-12-09 MED ORDER — WARFARIN SODIUM 1 MG PO TABS
1.0000 mg | ORAL_TABLET | Freq: Once | ORAL | Status: AC
  Administered 2020-12-09: 1 mg via ORAL
  Filled 2020-12-09: qty 1

## 2020-12-09 MED ORDER — PREDNISONE 20 MG PO TABS
50.0000 mg | ORAL_TABLET | Freq: Every day | ORAL | Status: DC
  Administered 2020-12-10: 50 mg via ORAL
  Filled 2020-12-09: qty 1

## 2020-12-09 NOTE — Plan of Care (Signed)
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
## 2020-12-09 NOTE — Progress Notes (Signed)
PROGRESS NOTE  Travis Cruz YQM:[id number] DOB: 09-02-1946 DOA: 12/03/2020 PCP: Dettinger, Fransisca Kaufmann, MD  Brief History:  74 year old male with a history of dementia, systolic and diastolic CHF, DVT/PE on warfarin, COPD, V. fib arrest status post AICD, hypertension, ischemic cardiomyopathy, lung cancer, chronic respiratory failure on 3 L, and stroke presenting with 5 to 6-day history of increasing confusion, low-grade fever and progressive dyspnea on exertion.  The patient is unable to provide any history.  All of history is obtained from review of the medical record and speaking with patient's spouse.  The patient has had increasing somnolence and decreasing oral intake.  The patient spouse took him to urgent care on 11/29/20 at which time a UA was unremarkable.  Home Covid test on 12/01/2020 was negative.  However, the patient continued to have low-grade fevers up to 100.9 F at home.  In addition, the patient's spouse also noted his oxygen saturation running in the low 90s, 90-92% on his usual 3 L at home. In the emergency department, the patient was febrile up to 1 to 2.4 F with tachycardia.  Oxygen saturation was 94-96% on 4 L.  WBC 15.2, hemoglobin 15.1, platelets 349,000.  Assessment/Plan: Acute on chronic respiratory failure with hypoxia--secondary to COVID-19 pneumonia -Currently back to his chronic 3 L nasal cannula supplementation with good saturation -Still with intermittent coughing spells (no sputum production); feeling short winded with activity. -Patient has completed 5 days of remdesivir infusion on 12/07/2020 -Will continue oral steroids; starting tapering process.. -Continue vitamin C and zinc. -PCT 0.11-->NEG; antibiotic has been discontinued. -Continue supportive care, incentive respirometer, flutter valve and as needed antitussive medication.  Acute metabolic encephalopathy -Secondary to COVID-19 infection -12/2 UA--no pyuria -overall improved, near baseline  now -Continue constant reorientation.  Systolic and diastolic CHF-chronic -Appears clinically compensated -03/03/2019 echo EF >60 percent -Continue to follow Daily weights -Continue furosemide -Follow strict I's and O's  COPD exacerbation -Intermittent wheezing has been described -Reporting intermittent coughing spells and very little difficulty speaking in full sentences currently. -Continue steroids and bronchodilator therapy.  Dementia without behavior disturbance -at high risk for hospital delirium -Continue constant reorientation and supportive care.    Coronary artery disease -No chest pain presently -Personally reviewed EKG--sinus rhythm, no ST-T wave change  Hyperlipidemia -Continue statin -Heart healthy diet has been encouraged.  History of DVT and PE -Pharmacy to dose  Essential hypertension -Continue metoprolol tartrate -Stable blood pressure.  Depression/anxiety -Continue Zoloft -Continue constant reorientation -Mood stable currently.  History of V. fib arrest January 2014/ischemic cardiomyopathy -Status post AICD -Continue telemetry monitoring -Patient denies palpitations.  Physical Deconditioning -PT eval>>SNF -Family in agreement -Patient will most likely be clinically stable for discharge to skilled nursing facility on 12/07/2020.  Supratherapeutic INR -INR 3.0 today -Continue to follow trend -Pharmacy helping with dose adjustment.  Status is: Inpatient    Dispo: The patient is from: Home              Anticipated d/c is to: Home              Anticipated d/c date is: 1 day (waiting on insurance authorization/nursing home availability).              Patient currently medically stable for discharge to skilled nursing facility.  Still mildly short of breath with exertion; but down to his baseline 3 L chronic oxygen supplementation.  Patient is in no distress.  He has completed 5 days of  remdesivir infusion and is tolerating oral steroids  with tapering plan. TOC assisting with patient's discharge.  Family Communication:   No family at bedside; patient's wife was updated over the phone 12/09/20.  Consultants:  none  Code Status:  FULL  DVT Prophylaxis:  warfarin   Procedures: As Listed in Progress Note Above  Antibiotics: Ceftriaxone 12/6>>12/7 azithro 12/6>.12/7   Subjective: Patient is feeling weak and deconditioned. Denies CP, nausea, vomiting and abd pain. demonstrating good O2 sat on his chronic oxygen supplementation (3L). No fever.  Objective: Vitals:   12/08/20 2009 12/08/20 2118 12/09/20 0545 12/09/20 0821  BP: 126/62  131/79   Pulse: (!) 44 89 (!) 58   Resp: 19  19   Temp: 97.8 F (36.6 C)  (!) 97.4 F (36.3 C)   TempSrc:      SpO2: 93%  92% 94%  Weight:      Height:        Intake/Output Summary (Last 24 hours) at 12/09/2020 0823 Last data filed at 12/08/2020 1807 Gross per 24 hour  Intake 840 ml  Output 450 ml  Net 390 ml   Weight change:   Exam: General exam: Alert, awake, oriented x 2; no CP, no nausea, no vomiting, no fever. Respiratory system: no using accessory muscles, good O2 sat on 3L chronic supplementation. Positive rhonchi on exam and patient expressed intermittent dry coughing spells.  Cardiovascular system:RRR. No murmurs, rubs, gallops. No JVD Gastrointestinal system: Abdomen is nondistended, soft and nontender. No organomegaly or masses felt. Normal bowel sounds heard. Central nervous system: No new focal neurological deficits. Extremities: No cyanosis, no clubbing. Skin: No petechiae. Psychiatry: Mood & affect appropriate.    Data Reviewed: I have personally reviewed following labs and imaging studies  Basic Metabolic Panel: Recent Labs  Lab 12/04/20 0429 12/05/20 0414 12/06/20 0452 12/07/20 0609 12/08/20 0752  NA 139 138 138 139 139  K 4.8 4.5 4.6 4.4 4.5  CL 101 97* 96* 94* 98  CO2 30 33* 36* 36* 34*  GLUCOSE 114* 127* 134* 137* 150*  BUN 17 22 25*  28* 35*  CREATININE 0.68 0.73 0.77 0.80 0.83  CALCIUM 9.1 9.1 9.0 9.2 8.9   Liver Function Tests: Recent Labs  Lab 12/04/20 0429 12/05/20 0414 12/06/20 0452 12/07/20 0609 12/08/20 0752  AST 38 33 30 27 25   ALT 25 28 29 29 28   ALKPHOS 110 104 99 99 98  BILITOT 0.4 0.3 0.6 0.6 0.4  PROT 7.0 6.6 6.4* 6.8 6.6  ALBUMIN 3.1* 2.9* 2.8* 3.1* 3.0*   Coagulation Profile: Recent Labs  Lab 12/04/20 0429 12/05/20 0414 12/06/20 0452 12/07/20 0609 12/08/20 0752  INR 3.3* 4.2* 3.1* 2.4* 2.8*   CBC: Recent Labs  Lab 12/04/20 0429 12/05/20 0414 12/06/20 0452 12/07/20 0609 12/08/20 0752  WBC 13.2* 16.2* 14.0* 13.7* 15.9*  NEUTROABS 10.4* 13.6* 11.3* 10.3* 12.6*  HGB 15.5 15.8 16.3 17.2* 17.4*  HCT 50.1 51.1 52.3* 54.3* 55.0*  MCV 92.8 92.9 92.9 91.6 91.1  PLT 357 379 385 405* 420*   Urine analysis:    Component Value Date/Time   COLORURINE YELLOW 12/04/2020 1700   APPEARANCEUR CLEAR 12/04/2020 1700   APPEARANCEUR Clear 07/13/2020 0000   LABSPEC 1.013 12/04/2020 1700   PHURINE 5.0 12/04/2020 1700   GLUCOSEU NEGATIVE 12/04/2020 1700   HGBUR NEGATIVE 12/04/2020 1700   BILIRUBINUR NEGATIVE 12/04/2020 1700   BILIRUBINUR negative 11/29/2020 1313   BILIRUBINUR Negative 07/13/2020 0000   KETONESUR NEGATIVE 12/04/2020 1700   PROTEINUR NEGATIVE  12/04/2020 1700   UROBILINOGEN 0.2 11/29/2020 1313   UROBILINOGEN 1.0 01/31/2013 1035   NITRITE NEGATIVE 12/04/2020 1700   LEUKOCYTESUR NEGATIVE 12/04/2020 1700   Sepsis Labs:  Recent Results (from the past 240 hour(s))  Urine Culture     Status: Abnormal   Collection Time: 11/29/20  1:13 PM   Specimen: Urine, Clean Catch  Result Value Ref Range Status   Specimen Description   Final    URINE, CLEAN CATCH Performed at [facility], [address], [place], [place] [address]    Special Requests   Final    NONE Performed at [facility], [address], [place], [place] [address]    Culture MULTIPLE SPECIES PRESENT, SUGGEST  RECOLLECTION (A)  Final   Report Status 12/01/2020 FINAL  Final  COVID-19, Flu A+B and RSV ([facility])     Status: Abnormal   Collection Time: 12/02/20 10:19 AM  Result Value Ref Range Status   SARS-CoV-2, NAA Detected (A) Not Detected Final    Comment: Patients who have a positive COVID-19 test result may now have treatment options. Treatment options are available for patients with mild to moderate symptoms and for hospitalized patients. Visit our website at [email or web address] for resources and information. This nucleic acid amplification test was developed and its performance characteristics determined by [employer]. Nucleic acid amplification tests include RT-PCR and TMA. This test has not been FDA cleared or approved. This test has been authorized by FDA under an Emergency Use Authorization (EUA). This test is only authorized for the duration of time the declaration that circumstances exist justifying the authorization of the emergency use of in vitro diagnostic tests for detection of SARS-CoV-2 virus and/or diagnosis of COVID-19 infection under section 564(b)(1) of the Act, 21 U.S.C. 315QMG-8(Q) (1), unless the authorization is terminated or revoked sooner. When diagnostic testing is negativ e, the possibility of a false negative result should be considered in the context of a patient's recent exposures and the presence of clinical signs and symptoms consistent with COVID-19. An individual without symptoms of COVID-19 and who is not shedding SARS-CoV-2 virus would expect to have a negative (not detected) result in this assay.    Influenza A, NAA Not Detected Not Detected Final   Influenza B, NAA Not Detected Not Detected Final   RSV, NAA Not Detected Not Detected Final  Resp Panel by RT-PCR (Flu A&B, Covid) Nasopharyngeal Swab     Status: Abnormal   Collection Time: 12/03/20  1:59 AM   Specimen: Nasopharyngeal Swab; Nasopharyngeal(NP) swabs in vial  transport medium  Result Value Ref Range Status   SARS Coronavirus 2 by RT PCR POSITIVE (A) NEGATIVE Final    Comment: RESULT CALLED TO, READ BACK BY AND VERIFIED WITH: EASTER,T @ 0316 ON 12/03/20 BY JUW (NOTE) SARS-CoV-2 target nucleic acids are DETECTED.  The SARS-CoV-2 RNA is generally detectable in upper respiratory specimens during the acute phase of infection. Positive results are indicative of the presence of the identified virus, but do not rule out bacterial infection or co-infection with other pathogens not detected by the test. Clinical correlation with patient history and other diagnostic information is necessary to determine patient infection status. The expected result is Negative.  Fact Sheet for Patients: EntrepreneurPulse.com.au  Fact Sheet for Healthcare Providers: IncredibleEmployment.be  This test is not yet approved or cleared by the [place] FDA and  has been authorized for detection and/or diagnosis of SARS-CoV-2 by FDA under an Emergency Use Authorization (EUA).  This EUA will remain in  effect (meaning this test can be  used) for the duration of  the COVID-19 declaration under Section 564(b)(1) of the Act, 21 U.S.C. section 360bbb-3(b)(1), unless the authorization is terminated or revoked sooner.     Influenza A by PCR NEGATIVE NEGATIVE Final   Influenza B by PCR NEGATIVE NEGATIVE Final    Comment: (NOTE) The Xpert Xpress SARS-CoV-2/FLU/RSV plus assay is intended as an aid in the diagnosis of influenza from Nasopharyngeal swab specimens and should not be used as a sole basis for treatment. Nasal washings and aspirates are unacceptable for Xpert Xpress SARS-CoV-2/FLU/RSV testing.  Fact Sheet for Patients: EntrepreneurPulse.com.au  Fact Sheet for Healthcare Providers: IncredibleEmployment.be  This test is not yet approved or cleared by the [place] FDA and has been  authorized for detection and/or diagnosis of SARS-CoV-2 by FDA under an Emergency Use Authorization (EUA). This EUA will remain in effect (meaning this test can be used) for the duration of the COVID-19 declaration under Section 564(b)(1) of the Act, 21 U.S.C. section 360bbb-3(b)(1), unless the authorization is terminated or revoked.  Performed at [facility], [address], [place], [place] [address]   Blood Culture (routine x 2)     Status: None   Collection Time: 12/03/20  2:58 AM   Specimen: Right Antecubital; Blood  Result Value Ref Range Status   Specimen Description RIGHT ANTECUBITAL  Final   Special Requests   Final    BOTTLES DRAWN AEROBIC ONLY Blood Culture results may not be optimal due to an inadequate volume of blood received in culture bottles   Culture   Final    NO GROWTH 5 DAYS Performed at [facility], [address] [place], [place] [address]    Report Status 12/08/2020 FINAL  Final  Blood Culture (routine x 2)     Status: None   Collection Time: 12/03/20  3:45 AM   Specimen: BLOOD RIGHT HAND  Result Value Ref Range Status   Specimen Description BLOOD RIGHT HAND  Final   Special Requests   Final    BOTTLES DRAWN AEROBIC AND ANAEROBIC Blood Culture adequate volume   Culture   Final    NO GROWTH 5 DAYS Performed at [facility], [address] [place], [place] [address]    Report Status 12/08/2020 FINAL  Final  Culture, Urine     Status: None   Collection Time: 12/04/20  5:00 PM   Specimen: Urine, Random  Result Value Ref Range Status   Specimen Description   Final    URINE, RANDOM Performed at [facility], [address], [place], [place] [address]    Special Requests   Final    NONE Performed at [facility], [address] [place], [place] [address]    Culture   Final    NO GROWTH Performed at [facility], [place] [address] [place], [place] [address]    Report Status 12/06/2020 FINAL  Final     Scheduled Meds: . aerochamber Z-Stat  Plus/medium  1 each Other Once  . vitamin C  500 mg Oral Daily  . atorvastatin  80 mg Oral Q M,W,F  . cholecalciferol  2,000 Units Oral Daily  . dextromethorphan-guaiFENesin  1 tablet Oral BID  . finasteride  5 mg Oral Daily  . furosemide  20 mg Oral Daily  . Ipratropium-Albuterol  1 puff Inhalation BID  . methylPREDNISolone (SOLU-MEDROL) injection  60 mg Intravenous Q12H  . metoprolol tartrate  37.5 mg Oral BID  . mirabegron ER  50 mg Oral Daily  . multivitamin with minerals  1 tablet Oral Daily  . sertraline  100 mg Oral Daily  . tamsulosin  0.4 mg Oral QPC supper  . Warfarin - Pharmacist Dosing Inpatient   Does not apply q1600  . zinc sulfate  220 mg Oral Daily   Continuous Infusions:   Procedures/Studies: DG Chest 2 View  Result Date: 12/02/2020 CLINICAL DATA:  Cough and shortness of breath for 3 days EXAM: CHEST - 2 VIEW COMPARISON:  07/20/2020 FINDINGS: Cardiac shadow is enlarged but stable. Defibrillator is again noted and stable. Bilateral scarring is seen with interstitial prominence stable from the prior study. No new focal infiltrate is seen. No bony abnormality is noted. IMPRESSION: Chronic scarring bilaterally.  No acute abnormality seen. Electronically Signed   By: Inez Catalina M.D.   On: 12/02/2020 10:02   DG Chest Port 1 View  Result Date: 12/03/2020 CLINICAL DATA:  Shortness of breath and cough. EXAM: PORTABLE CHEST 1 VIEW COMPARISON:  December 02, 2020 FINDINGS: There is an unchanged appearance of the right-sided ICD. The heart size is enlarged. The lung volumes are low. Linear airspace opacities are again noted bilaterally. There is no pneumothorax. There is no large pleural effusion. No acute osseous abnormality. There are multiple old left-sided rib fractures. IMPRESSION: No active disease. Electronically Signed   By: Constance Holster M.D.   On: 12/03/2020 02:23   CUP PACEART INCLINIC DEVICE CHECK  Result Date: 11/21/2020 ICD check in clinic. Normal device  function. Thresholds and sensing consistent with previous device measurements. Impedance trends stable over time.  2 NSVT episode, seen on previous remotes, < 20 beats. Histogram distribution appropriate for patient and  level of activity. No changes made this session. Device programmed at appropriate safety margins. Device programmed to optimize intrinsic conduction. Estimated longevity 3 years. Pt enrolled in remote follow-up 12/31/20. Patient education completed. ROV w/ SK.Lavenia Atlas, BSN, RN   Barton Dubois, MD [facility]   12/09/2020, 8:23 AM   LOS: 6 days

## 2020-12-09 NOTE — Plan of Care (Signed)
  Problem: Education: Goal: Knowledge of General Education information will improve Description: Including pain rating scale, medication(s)/side effects and non-pharmacologic comfort measures 12/09/2020 0150 by Baldemar Friday, RN Outcome: Not Progressing 12/09/2020 0150 by Baldemar Friday, RN Outcome: Progressing   Problem: Health Behavior/Discharge Planning: Goal: Ability to manage health-related needs will improve 12/09/2020 0150 by Baldemar Friday, RN Outcome: Not Progressing 12/09/2020 0150 by Baldemar Friday, RN Outcome: Progressing   Problem: Clinical Measurements: Goal: Ability to maintain clinical measurements within normal limits will improve 12/09/2020 0150 by Baldemar Friday, RN Outcome: Not Progressing 12/09/2020 0150 by Baldemar Friday, RN Outcome: Progressing Goal: Will remain free from infection 12/09/2020 0150 by Baldemar Friday, RN Outcome: Not Progressing 12/09/2020 0150 by Baldemar Friday, RN Outcome: Progressing Goal: Diagnostic test results will improve 12/09/2020 0150 by Baldemar Friday, RN Outcome: Not Progressing 12/09/2020 0150 by Baldemar Friday, RN Outcome: Progressing Goal: Respiratory complications will improve 12/09/2020 0150 by Baldemar Friday, RN Outcome: Not Progressing 12/09/2020 0150 by Baldemar Friday, RN Outcome: Progressing Goal: Cardiovascular complication will be avoided 12/09/2020 0150 by Baldemar Friday, RN Outcome: Not Progressing 12/09/2020 0150 by Baldemar Friday, RN Outcome: Progressing   Problem: Activity: Goal: Risk for activity intolerance will decrease 12/09/2020 0150 by Baldemar Friday, RN Outcome: Not Progressing 12/09/2020 0150 by Baldemar Friday, RN Outcome: Progressing   Problem: Nutrition: Goal: Adequate nutrition will be maintained 12/09/2020 0150 by Baldemar Friday, RN Outcome: Not Progressing 12/09/2020 0150 by Baldemar Friday, RN Outcome: Progressing   Problem: Coping: Goal: Level of anxiety will decrease 12/09/2020  0150 by Baldemar Friday, RN Outcome: Not Progressing 12/09/2020 0150 by Baldemar Friday, RN Outcome: Progressing   Problem: Elimination: Goal: Will not experience complications related to bowel motility 12/09/2020 0150 by Baldemar Friday, RN Outcome: Not Progressing 12/09/2020 0150 by Baldemar Friday, RN Outcome: Progressing Goal: Will not experience complications related to urinary retention 12/09/2020 0150 by Baldemar Friday, RN Outcome: Not Progressing 12/09/2020 0150 by Baldemar Friday, RN Outcome: Progressing   Problem: Pain Managment: Goal: General experience of comfort will improve 12/09/2020 0150 by Baldemar Friday, RN Outcome: Not Progressing 12/09/2020 0150 by Baldemar Friday, RN Outcome: Progressing   Problem: Safety: Goal: Ability to remain free from injury will improve 12/09/2020 0150 by Baldemar Friday, RN Outcome: Not Progressing 12/09/2020 0150 by Baldemar Friday, RN Outcome: Progressing   Problem: Skin Integrity: Goal: Risk for impaired skin integrity will decrease 12/09/2020 0150 by Baldemar Friday, RN Outcome: Not Progressing 12/09/2020 0150 by Baldemar Friday, RN Outcome: Progressing   Problem: Education: Goal: Knowledge of risk factors and measures for prevention of condition will improve 12/09/2020 0150 by Baldemar Friday, RN Outcome: Not Progressing 12/09/2020 0150 by Baldemar Friday, RN Outcome: Progressing   Problem: Coping: Goal: Psychosocial and spiritual needs will be supported 12/09/2020 0150 by Baldemar Friday, RN Outcome: Not Progressing 12/09/2020 0150 by Baldemar Friday, RN Outcome: Progressing   Problem: Respiratory: Goal: Will maintain a patent airway 12/09/2020 0150 by Baldemar Friday, RN Outcome: Not Progressing 12/09/2020 0150 by Baldemar Friday, RN Outcome: Progressing Goal: Complications related to the disease process, condition or treatment will be avoided or minimized 12/09/2020 0150 by Baldemar Friday, RN Outcome: Not  Progressing 12/09/2020 0150 by Baldemar Friday, RN Outcome: Progressing

## 2020-12-10 LAB — BASIC METABOLIC PANEL
Anion gap: 9 (ref 5–15)
BUN: 39 mg/dL — ABNORMAL HIGH (ref 8–23)
CO2: 36 mmol/L — ABNORMAL HIGH (ref 22–32)
Calcium: 8.9 mg/dL (ref 8.9–10.3)
Chloride: 96 mmol/L — ABNORMAL LOW (ref 98–111)
Creatinine, Ser: 1.06 mg/dL (ref 0.61–1.24)
GFR, Estimated: >60 mL/min (ref >60)
Glucose, Bld: 110 mg/dL — ABNORMAL HIGH (ref 70–99)
Potassium: 4.2 mmol/L (ref 3.5–5.1)
Sodium: 141 mmol/L (ref 135–145)

## 2020-12-10 LAB — PROTIME-INR
INR: 4 — ABNORMAL HIGH (ref 0.8–1.2)
Prothrombin Time: 37.8 seconds — ABNORMAL HIGH (ref 11.4–15.2)

## 2020-12-10 LAB — CBC
HCT: 58.5 % — ABNORMAL HIGH (ref 39.0–52.0)
Hemoglobin: 18.4 g/dL — ABNORMAL HIGH (ref 13.0–17.0)
MCH: 28.7 pg (ref 26.0–34.0)
MCHC: 31.5 g/dL (ref 30.0–36.0)
MCV: 91.1 fL (ref 80.0–100.0)
Platelets: 493 10*3/uL — ABNORMAL HIGH (ref 150–400)
RBC: 6.42 MIL/uL — ABNORMAL HIGH (ref 4.22–5.81)
RDW: 17.2 % — ABNORMAL HIGH (ref 11.5–15.5)
WBC: 22.2 10*3/uL — ABNORMAL HIGH (ref 4.0–10.5)
nRBC: 0.1 % (ref 0.0–0.2)

## 2020-12-10 MED ORDER — PREDNISONE 20 MG PO TABS
40.0000 mg | ORAL_TABLET | Freq: Every day | ORAL | Status: DC
  Administered 2020-12-11 – 2020-12-14 (×4): 40 mg via ORAL
  Filled 2020-12-10 (×4): qty 2

## 2020-12-10 NOTE — TOC Progression Note (Signed)
Transition of Care [facility]) - Progression Note    Patient Details  Name: Travis Cruz MRN: [id number] Date of Birth: Sep 29, 1946  Transition of Care [facility]) CM/SW Contact  Ihor Gully, LCSW Phone Number: 12/10/2020, 2:03 PM  Clinical Narrative:    Spoke with Ms. Wynonia Lawman. Discussed difficulty locating placement accepting COVID+ patients. Discussed facility, [facility], in [place], [place]. Ms. Palmero is agreeable to referral being made.  Spoke with Arbie Cookey at [facility] at [place] and manually faxed patient's clinicals for review.    Expected Discharge Plan: [place] Barriers to Discharge: Continued Medical Work up  Expected Discharge Plan and Services Expected Discharge Plan: [place] arrangements for the past 2 months: Single Family Home                                       Social Determinants of Health (SDOH) Interventions    Readmission Risk Interventions No flowsheet data found.

## 2020-12-10 NOTE — Progress Notes (Signed)
PROGRESS NOTE  Corey Laski YYT:[id number] DOB: [birth year] DOA: 12/03/2020 PCP: Dettinger, Fransisca Kaufmann, MD  Brief History:  74 year old male with a history of dementia, systolic and diastolic CHF, DVT/PE on warfarin, COPD, V. fib arrest status post AICD, hypertension, ischemic cardiomyopathy, lung cancer, chronic respiratory failure on 3 L, and stroke presenting with 5 to 6-day history of increasing confusion, low-grade fever and progressive dyspnea on exertion.  The patient is unable to provide any history.  All of history is obtained from review of the medical record and speaking with patient's spouse.  The patient has had increasing somnolence and decreasing oral intake.  The patient spouse took him to urgent care on 11/29/20 at which time a UA was unremarkable.  Home Covid test on 12/01/2020 was negative.  However, the patient continued to have low-grade fevers up to 100.9 F at home.  In addition, the patient's spouse also noted his oxygen saturation running in the low 90s, 90-92% on his usual 3 L at home. In the emergency department, the patient was febrile up to 1 to 2.4 F with tachycardia.  Oxygen saturation was 94-96% on 4 L.  WBC 15.2, hemoglobin 15.1, platelets 349,000.  Assessment/Plan: Acute on chronic respiratory failure with hypoxia--secondary to COVID-19 pneumonia -Currently back to his chronic 3 L nasal cannula supplementation with good saturation -Still with intermittent coughing spells (no sputum production); feeling short winded with activity. -Patient has completed 5 days of remdesivir infusion on 12/07/2020 -Will continue steroids tapering. -Continue vitamin C and zinc. -PCT 0.11-->NEG; antibiotic has been discontinued. -Continue supportive care, incentive respirometer, flutter valve and as needed antitussive medication.  Acute metabolic encephalopathy -Secondary to COVID-19 infection -12/2 UA--no pyuria -overall improved, near baseline now -Continue constant  reorientation.  Systolic and diastolic CHF-chronic -Appears clinically compensated -03/03/2019 echo EF >60 percent -Continue to follow Daily weights -Continue furosemide -Follow strict I's and O's  COPD exacerbation -Intermittent wheezing has been described -Reporting intermittent coughing spells and very little difficulty speaking in full sentences currently. -Continue steroids and bronchodilator therapy.  Dementia without behavior disturbance -at high risk for hospital delirium -Continue constant reorientation and supportive care.    Coronary artery disease -No chest pain presently -Personally reviewed EKG--sinus rhythm, no ST-T wave change  Hyperlipidemia -Continue statin -Heart healthy diet has been encouraged.  History of DVT and PE -Pharmacy to dose  Essential hypertension -Continue metoprolol tartrate -Stable blood pressure.  Depression/anxiety -Continue Zoloft -Continue constant reorientation -Mood stable currently.  History of V. fib arrest January 2014/ischemic cardiomyopathy -Status post AICD -Continue telemetry monitoring -Patient denies palpitations.  Physical Deconditioning -PT eval>>SNF -Family in agreement -Patient will most likely be clinically stable for discharge to skilled nursing facility on 12/07/2020.  Supratherapeutic INR -INR 3.0 today -Continue to follow trend -Pharmacy helping with dose adjustment.  Status is: Inpatient    Dispo: The patient is from: Home              Anticipated d/c is to: Home              Anticipated d/c date is: 1 day (waiting on insurance authorization/nursing home availability).              Patient currently medically stable for discharge to skilled nursing facility.  Still mildly short of breath with exertion; but down to his baseline 3 L chronic oxygen supplementation.  Patient is in no distress.  He has completed 5 days of remdesivir infusion and  is tolerating oral steroids with tapering  plan. TOC assisting with patient's discharge.  Family Communication:   No family at bedside; patient's wife was updated over the phone 12/09/20.  Consultants:  none  Code Status:  FULL  DVT Prophylaxis:  warfarin   Procedures: As Listed in Progress Note Above  Antibiotics: Ceftriaxone 12/6>>12/7 azithro 12/6>.12/7   Subjective: Patient reports feeling weak, tired and deconditioned; expressed breathing is a stable.  No overnight events.  Denies chest pain, nausea, vomiting or fever.  Objective: Vitals:   12/09/20 2000 12/09/20 2058 12/10/20 0537 12/10/20 0726  BP:  122/78 122/74   Pulse:  84 75   Resp:  18 18   Temp:  98.2 F (36.8 C) 97.7 F (36.5 C)   TempSrc:  Oral Oral   SpO2: 94% 92% 94% 94%  Weight:      Height:       No intake or output data in the 24 hours ending 12/10/20 1329 Weight change:   Exam: General exam: Alert, awake, oriented x 2, following commands appropriately and expressing no acute complaints.  Expressing his breathing is a stable and just feels weak and deconditioned.  No fever, no chest pain, no nausea, no vomiting. Respiratory system: Good air movement bilaterally; no wheezing, no crackles, no using accessory muscles.  Good oxygen saturation on 3 L nasal cannula supplementation. Cardiovascular system:RRR. No rubs or gallops; no JVD. Gastrointestinal system: Abdomen is nondistended, soft and nontender. No organomegaly or masses felt. Normal bowel sounds heard. Central nervous system: Alert and oriented. No focal neurological deficits. Extremities: No cyanosis or clubbing. Skin: No petechiae. Psychiatry: Mood & affect appropriate.    Data Reviewed: I have personally reviewed following labs and imaging studies  Basic Metabolic Panel: Recent Labs  Lab 12/05/20 0414 12/06/20 0452 12/07/20 0609 12/08/20 0752 12/10/20 0536  NA 138 138 139 139 141  K 4.5 4.6 4.4 4.5 4.2  CL 97* 96* 94* 98 96*  CO2 33* 36* 36* 34* 36*  GLUCOSE 127*  134* 137* 150* 110*  BUN 22 25* 28* 35* 39*  CREATININE 0.73 0.77 0.80 0.83 1.06  CALCIUM 9.1 9.0 9.2 8.9 8.9   Liver Function Tests: Recent Labs  Lab 12/04/20 0429 12/05/20 0414 12/06/20 0452 12/07/20 0609 12/08/20 0752  AST 38 33 30 27 25   ALT 25 28 29 29 28   ALKPHOS 110 104 99 99 98  BILITOT 0.4 0.3 0.6 0.6 0.4  PROT 7.0 6.6 6.4* 6.8 6.6  ALBUMIN 3.1* 2.9* 2.8* 3.1* 3.0*   Coagulation Profile: Recent Labs  Lab 12/06/20 0452 12/07/20 0609 12/08/20 0752 12/09/20 0755 12/10/20 0536  INR 3.1* 2.4* 2.8* 3.0* 4.0*   CBC: Recent Labs  Lab 12/04/20 0429 12/05/20 0414 12/06/20 0452 12/07/20 0609 12/08/20 0752 12/10/20 0536  WBC 13.2* 16.2* 14.0* 13.7* 15.9* 22.2*  NEUTROABS 10.4* 13.6* 11.3* 10.3* 12.6*  --   HGB 15.5 15.8 16.3 17.2* 17.4* 18.4*  HCT 50.1 51.1 52.3* 54.3* 55.0* 58.5*  MCV 92.8 92.9 92.9 91.6 91.1 91.1  PLT 357 379 385 405* 420* 493*   Urine analysis:    Component Value Date/Time   COLORURINE YELLOW 12/04/2020 1700   APPEARANCEUR CLEAR 12/04/2020 1700   APPEARANCEUR Clear 07/13/2020 0000   LABSPEC 1.013 12/04/2020 1700   PHURINE 5.0 12/04/2020 1700   GLUCOSEU NEGATIVE 12/04/2020 1700   HGBUR NEGATIVE 12/04/2020 1700   BILIRUBINUR NEGATIVE 12/04/2020 1700   BILIRUBINUR negative 11/29/2020 1313   BILIRUBINUR Negative 07/13/2020 0000   KETONESUR NEGATIVE  12/04/2020 1700   PROTEINUR NEGATIVE 12/04/2020 1700   UROBILINOGEN 0.2 11/29/2020 1313   UROBILINOGEN 1.0 01/31/2013 1035   NITRITE NEGATIVE 12/04/2020 1700   LEUKOCYTESUR NEGATIVE 12/04/2020 1700   Sepsis Labs:  Recent Results (from the past 240 hour(s))  COVID-19, Flu A+B and RSV ([facility])     Status: Abnormal   Collection Time: 12/02/20 10:19 AM  Result Value Ref Range Status   SARS-CoV-2, NAA Detected (A) Not Detected Final    Comment: Patients who have a positive COVID-19 test result may now have treatment options. Treatment options are available for patients with mild to  moderate symptoms and for hospitalized patients. Visit our website at [email or web address] for resources and information. This nucleic acid amplification test was developed and its performance characteristics determined by [employer]. Nucleic acid amplification tests include RT-PCR and TMA. This test has not been FDA cleared or approved. This test has been authorized by FDA under an Emergency Use Authorization (EUA). This test is only authorized for the duration of time the declaration that circumstances exist justifying the authorization of the emergency use of in vitro diagnostic tests for detection of SARS-CoV-2 virus and/or diagnosis of COVID-19 infection under section 564(b)(1) of the Act, 21 U.S.C. 295AOZ-3(Y) (1), unless the authorization is terminated or revoked sooner. When diagnostic testing is negativ e, the possibility of a false negative result should be considered in the context of a patient's recent exposures and the presence of clinical signs and symptoms consistent with COVID-19. An individual without symptoms of COVID-19 and who is not shedding SARS-CoV-2 virus would expect to have a negative (not detected) result in this assay.    Influenza A, NAA Not Detected Not Detected Final   Influenza B, NAA Not Detected Not Detected Final   RSV, NAA Not Detected Not Detected Final  Resp Panel by RT-PCR (Flu A&B, Covid) Nasopharyngeal Swab     Status: Abnormal   Collection Time: 12/03/20  1:59 AM   Specimen: Nasopharyngeal Swab; Nasopharyngeal(NP) swabs in vial transport medium  Result Value Ref Range Status   SARS Coronavirus 2 by RT PCR POSITIVE (A) NEGATIVE Final    Comment: RESULT CALLED TO, READ BACK BY AND VERIFIED WITH: EASTER,T @ 0316 ON 12/03/20 BY JUW (NOTE) SARS-CoV-2 target nucleic acids are DETECTED.  The SARS-CoV-2 RNA is generally detectable in upper respiratory specimens during the acute phase of infection. Positive results  are indicative of the presence of the identified virus, but do not rule out bacterial infection or co-infection with other pathogens not detected by the test. Clinical correlation with patient history and other diagnostic information is necessary to determine patient infection status. The expected result is Negative.  Fact Sheet for Patients: EntrepreneurPulse.com.au  Fact Sheet for Healthcare Providers: IncredibleEmployment.be  This test is not yet approved or cleared by the [place] FDA and  has been authorized for detection and/or diagnosis of SARS-CoV-2 by FDA under an Emergency Use Authorization (EUA).  This EUA will remain in effect (meaning this test can be  used) for the duration of  the COVID-19 declaration under Section 564(b)(1) of the Act, 21 U.S.C. section 360bbb-3(b)(1), unless the authorization is terminated or revoked sooner.     Influenza A by PCR NEGATIVE NEGATIVE Final   Influenza B by PCR NEGATIVE NEGATIVE Final    Comment: (NOTE) The Xpert Xpress SARS-CoV-2/FLU/RSV plus assay is intended as an aid in the diagnosis of influenza from Nasopharyngeal swab specimens and should not be used as a sole basis for treatment.  Nasal washings and aspirates are unacceptable for Xpert Xpress SARS-CoV-2/FLU/RSV testing.  Fact Sheet for Patients: EntrepreneurPulse.com.au  Fact Sheet for Healthcare Providers: IncredibleEmployment.be  This test is not yet approved or cleared by the [place] FDA and has been authorized for detection and/or diagnosis of SARS-CoV-2 by FDA under an Emergency Use Authorization (EUA). This EUA will remain in effect (meaning this test can be used) for the duration of the COVID-19 declaration under Section 564(b)(1) of the Act, 21 U.S.C. section 360bbb-3(b)(1), unless the authorization is terminated or revoked.  Performed at [facility], [address]  [place], [place] [address]   Blood Culture (routine x 2)     Status: None   Collection Time: 12/03/20  2:58 AM   Specimen: Right Antecubital; Blood  Result Value Ref Range Status   Specimen Description RIGHT ANTECUBITAL  Final   Special Requests   Final    BOTTLES DRAWN AEROBIC ONLY Blood Culture results may not be optimal due to an inadequate volume of blood received in culture bottles   Culture   Final    NO GROWTH 5 DAYS Performed at [facility], [address] [place], [place] [address]    Report Status 12/08/2020 FINAL  Final  Blood Culture (routine x 2)     Status: None   Collection Time: 12/03/20  3:45 AM   Specimen: BLOOD RIGHT HAND  Result Value Ref Range Status   Specimen Description BLOOD RIGHT HAND  Final   Special Requests   Final    BOTTLES DRAWN AEROBIC AND ANAEROBIC Blood Culture adequate volume   Culture   Final    NO GROWTH 5 DAYS Performed at [facility], [address], [place], [place] [address]    Report Status 12/08/2020 FINAL  Final  Culture, Urine     Status: None   Collection Time: 12/04/20  5:00 PM   Specimen: Urine, Random  Result Value Ref Range Status   Specimen Description   Final    URINE, RANDOM Performed at [facility], [address], [place], [place] [address]    Special Requests   Final    NONE Performed at [facility], [address] [place], [place] [address]    Culture   Final    NO GROWTH Performed at [facility], [place] [address], [place],  [address]    Report Status 12/06/2020 FINAL  Final     Scheduled Meds: . aerochamber Z-Stat Plus/medium  1 each Other Once  . vitamin C  500 mg Oral Daily  . atorvastatin  80 mg Oral Q M,W,F  . cholecalciferol  2,000 Units Oral Daily  . dextromethorphan-guaiFENesin  1 tablet Oral BID  . finasteride  5 mg Oral Daily  . furosemide  20 mg Oral Daily  . Ipratropium-Albuterol  1 puff Inhalation BID  . metoprolol tartrate  37.5 mg Oral BID  . mirabegron ER  50 mg Oral Daily  .  multivitamin with minerals  1 tablet Oral Daily  . predniSONE  50 mg Oral Q breakfast  . sertraline  100 mg Oral Daily  . tamsulosin  0.4 mg Oral QPC supper  . Warfarin - Pharmacist Dosing Inpatient   Does not apply q1600  . zinc sulfate  220 mg Oral Daily   Continuous Infusions:   Procedures/Studies: DG Chest 2 View  Result Date: 12/02/2020 CLINICAL DATA:  Cough and shortness of breath for 3 days EXAM: CHEST - 2 VIEW COMPARISON:  07/20/2020 FINDINGS: Cardiac shadow is  enlarged but stable. Defibrillator is again noted and stable. Bilateral scarring is seen with interstitial prominence stable from the prior study. No new focal infiltrate is seen. No bony abnormality is noted. IMPRESSION: Chronic scarring bilaterally.  No acute abnormality seen. Electronically Signed   By: Inez Catalina M.D.   On: 12/02/2020 10:02   DG Chest Port 1 View  Result Date: 12/03/2020 CLINICAL DATA:  Shortness of breath and cough. EXAM: PORTABLE CHEST 1 VIEW COMPARISON:  December 02, 2020 FINDINGS: There is an unchanged appearance of the right-sided ICD. The heart size is enlarged. The lung volumes are low. Linear airspace opacities are again noted bilaterally. There is no pneumothorax. There is no large pleural effusion. No acute osseous abnormality. There are multiple old left-sided rib fractures. IMPRESSION: No active disease. Electronically Signed   By: Constance Holster M.D.   On: 12/03/2020 02:23   CUP PACEART INCLINIC DEVICE CHECK  Result Date: 11/21/2020 ICD check in clinic. Normal device function. Thresholds and sensing consistent with previous device measurements. Impedance trends stable over time.  2 NSVT episode, seen on previous remotes, < 20 beats. Histogram distribution appropriate for patient and  level of activity. No changes made this session. Device programmed at appropriate safety margins. Device programmed to optimize intrinsic conduction. Estimated longevity 3 years. Pt enrolled in remote follow-up  12/31/20. Patient education completed. ROV w/ SK.Lavenia Atlas, BSN, RN   Barton Dubois, MD [facility]   12/10/2020, 1:29 PM   LOS: 7 days

## 2020-12-10 NOTE — Plan of Care (Signed)
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
## 2020-12-11 ENCOUNTER — Encounter: Payer: Self-pay | Admitting: Family Medicine

## 2020-12-11 ENCOUNTER — Encounter (HOSPITAL_COMMUNITY): Payer: Self-pay

## 2020-12-11 LAB — PROTIME-INR
INR: 3.4 — ABNORMAL HIGH (ref 0.8–1.2)
Prothrombin Time: 33.1 seconds — ABNORMAL HIGH (ref 11.4–15.2)

## 2020-12-11 NOTE — TOC Progression Note (Signed)
Transition of Care [facility]) - Progression Note    Patient Details  Name: Travis Cruz MRN: [id number] Date of Birth: 12-31-1945  Transition of Care [facility]) CM/SW Contact  Ihor Gully, LCSW Phone Number: 12/11/2020, 2:17 PM  Clinical Narrative:    Arbie Cookey with [facility] in [place] makes a bed offer. Facility will start insurance authorization. Spouse notified.    Expected Discharge Plan: [place] Barriers to Discharge: Continued Medical Work up  Expected Discharge Plan and Services Expected Discharge Plan: [place] arrangements for the past 2 months: Single Family Home                                       Social Determinants of Health (SDOH) Interventions    Readmission Risk Interventions No flowsheet data found.

## 2020-12-11 NOTE — Progress Notes (Signed)
Physical Therapy Treatment Patient Details Name: Travis Cruz MRN: [id number] DOB: 1946-06-16 Today's Date: 12/11/2020    History of Present Illness Travis Cruz is a 74 y.o. male with medical history of dementia, systolic and diastolic CHF, DVT/PE on warfarin, COPD, V. fib arrest status post AICD, hypertension, ischemic cardiomyopathy, lung cancer, chronic respiratory failure on 3 L, and stroke presenting with 5 to 6-day history of increasing confusion, low-grade fever and progressive dyspnea on exertion.  The patient is unable to provide any history.  All of history is obtained from review of the medical record and speaking with patient's spouse.  The patient has had increasing somnolence and decreasing oral intake.  The patient spouse took him to urgent care on 11/29/20 at which time a UA was unremarkable.  Home Covid test on 12/01/2020 was negative.  However, the patient continued to have low-grade fevers up to 100.9 F at home.  In addition, the patient's spouse also noted his oxygen saturation running in the low 90s, 90-92% on his usual 3 L at home.In the emergency department, the patient was febrile up to 102.4 F with tachycardia.  Oxygen saturation was 94-96% on 4 L.  WBC 15.2, hemoglobin 15.1, platelets 349,000.  BMP was essentially unremarkable with serum creatinine 0.82.  LFTs were unremarkable.  Chest x-ray showed right lower lobe, right lower lobe, left lower lobe opacities which have been chronic.  The patient was started on dexamethasone and remdesivir.    PT Comments    Patient demonstrates slow labored movement for sitting up at bedside requiring use of bed rail, had most difficulty scooting forward due to generalized weakness, requires repeated verbal/tactile cueing for completing BLE ROM/strengthening exercises with fair/poor carryover, very unsteady on feet and limited to a few side steps before having to sit due to fatigue and mild SOB.  Patient tolerated sitting up in  chair after therapy - RN notified.  Patient will benefit from continued physical therapy in hospital and recommended venue below to increase strength, balance, endurance for safe ADLs and gait.     Follow Up Recommendations  SNF;Supervision for mobility/OOB;Supervision - Intermittent     Equipment Recommendations  None recommended by PT    Recommendations for Other Services       Precautions / Restrictions Precautions Precautions: Fall Restrictions Weight Bearing Restrictions: No    Mobility  Bed Mobility Overal bed mobility: Needs Assistance Bed Mobility: Supine to Sit     Supine to sit: Min assist;Min guard     General bed mobility comments: increased time, labored movement, required most assistance for scooting forward  Transfers Overall transfer level: Needs assistance Equipment used: Rolling walker (2 wheeled) Transfers: Sit to/from [facility] Sit to Stand: Min assist;Mod assist Stand pivot transfers: Mod assist       General transfer comment: slow labored movement  Ambulation/Gait Ambulation/Gait assistance: Mod assist Gait Distance (Feet): 5 Feet Assistive device: Rolling walker (2 wheeled) Gait Pattern/deviations: Decreased step length - right;Decreased step length - left;Decreased stride length Gait velocity: decreased   General Gait Details: limited to 5-6 slow unsteady labored side steps at bedside, requires much time to turn with frequent verbal/tactile cueing, limited secondary to fatigue and mild SOB   Stairs             Wheelchair Mobility    Modified Rankin (Stroke Patients Only)       Balance  Cognition Arousal/Alertness: Awake/alert Behavior During Therapy: WFL for tasks assessed/performed Overall Cognitive Status: History of cognitive impairments - at baseline                                        Exercises General Exercises -  Lower Extremity Ankle Circles/Pumps: Seated;AROM;Strengthening;Both;5 reps Long Arc Quad: Seated;AROM;AAROM;Both;Strengthening;5 reps    General Comments        Pertinent Vitals/Pain Pain Assessment: No/denies pain    Home Living                      Prior Function            PT Goals (current goals can now be found in the care plan section) Acute Rehab PT Goals Patient Stated Goal: return home with family to assist PT Goal Formulation: With patient Time For Goal Achievement: 12/18/20 Potential to Achieve Goals: Good Progress towards PT goals: Progressing toward goals    Frequency    Min 3X/week      PT Plan      Co-evaluation              AM-PAC PT "6 Clicks" Mobility   Outcome Measure  Help needed turning from your back to your side while in a flat bed without using bedrails?: A Little Help needed moving from lying on your back to sitting on the side of a flat bed without using bedrails?: A Little Help needed moving to and from a bed to a chair (including a wheelchair)?: A Lot Help needed standing up from a chair using your arms (e.g., wheelchair or bedside chair)?: A Lot Help needed to walk in hospital room?: A Lot Help needed climbing 3-5 steps with a railing? : Total 6 Click Score: 13    End of Session Equipment Utilized During Treatment: Oxygen Activity Tolerance: Patient tolerated treatment well;Patient limited by fatigue Patient left: in chair;with call bell/phone within reach;with chair alarm set Nurse Communication: Mobility status PT Visit Diagnosis: Unsteadiness on feet (R26.81);Other abnormalities of gait and mobility (R26.89);Muscle weakness (generalized) (M62.81)     Time: 8841-6606 PT Time Calculation (min) (ACUTE ONLY): 27 min  Charges:  $Therapeutic Activity: 23-37 mins                     11:46 AM, 12/11/20 [name], MPT Physical Therapist with [facility] [phone] office [phone] mobile  phone

## 2020-12-11 NOTE — Progress Notes (Signed)
PROGRESS NOTE  Travis Cruz DGL:[id number] DOB: [birth year] DOA: 12/03/2020 PCP: Dettinger, Fransisca Kaufmann, MD  Brief History:  74 year old male with a history of dementia, systolic and diastolic CHF, DVT/PE on warfarin, COPD, V. fib arrest status post AICD, hypertension, ischemic cardiomyopathy, lung cancer, chronic respiratory failure on 3 L, and stroke presenting with 5 to 6-day history of increasing confusion, low-grade fever and progressive dyspnea on exertion.  The patient is unable to provide any history.  All of history is obtained from review of the medical record and speaking with patient's spouse.  The patient has had increasing somnolence and decreasing oral intake.  The patient spouse took him to urgent care on 11/29/20 at which time a UA was unremarkable.  Home Covid test on 12/01/2020 was negative.  However, the patient continued to have low-grade fevers up to 100.9 F at home.  In addition, the patient's spouse also noted his oxygen saturation running in the low 90s, 90-92% on his usual 3 L at home. In the emergency department, the patient was febrile up to 1 to 2.4 F with tachycardia.  Oxygen saturation was 94-96% on 4 L.  WBC 15.2, hemoglobin 15.1, platelets 349,000.  Assessment/Plan: Acute on chronic respiratory failure with hypoxia--secondary to COVID-19 pneumonia -Currently back to his chronic 3 L nasal cannula supplementation with good saturation -Still with intermittent coughing spells (no sputum production); feeling short winded with activity. -Patient has completed 5 days of remdesivir infusion on 12/07/2020 -Will continue steroids tapering. -Continue vitamin C and zinc. -PCT 0.11-->NEG; antibiotic has been discontinued. -Continue supportive care, incentive respirometer, flutter valve and as needed antitussive medication.  Acute metabolic encephalopathy -Secondary to COVID-19 infection -12/2 UA--no pyuria -overall improved, near baseline now -Continue constant  reorientation.  Systolic and diastolic CHF-chronic -Appears clinically compensated -03/03/2019 echo EF >60 percent -Continue to follow Daily weights -Continue furosemide -Follow strict I's and O's  COPD exacerbation -Intermittent wheezing has been described -Reporting intermittent coughing spells and very little difficulty speaking in full sentences currently. -Continue steroids and bronchodilator therapy.  Dementia without behavior disturbance -at high risk for hospital delirium -Continue constant reorientation and supportive care.    Coronary artery disease -No chest pain presently -Personally reviewed EKG--sinus rhythm, no ST-T wave change  Hyperlipidemia -Continue statin -Heart healthy diet has been encouraged.  History of DVT and PE -Pharmacy to dose  Essential hypertension -Continue metoprolol tartrate -Stable blood pressure.  Depression/anxiety -Continue Zoloft -Continue constant reorientation -Mood stable currently.  History of V. fib arrest January 2014/ischemic cardiomyopathy -Status post AICD -Continue telemetry monitoring -Patient denies palpitations.  Physical Deconditioning -PT eval>>SNF -Family in agreement -Patient will most likely be clinically stable for discharge to skilled nursing facility on 12/07/2020.  Supratherapeutic INR -INR 3.0 today -Continue to follow trend -Pharmacy helping with dose adjustment.  Status is: Inpatient    Dispo: The patient is from: Home              Anticipated d/c is to: Home              Anticipated d/c date is: 1 day (waiting on insurance authorization/nursing home availability).              Patient currently medically stable for discharge to skilled nursing facility.  Still mildly short of breath with exertion; but down to his baseline 3 L chronic oxygen supplementation.  Patient is in no distress.  He has completed 5 days of remdesivir infusion and  is tolerating oral steroids with tapering  plan. TOC assisting with patient's discharge.  Family Communication:   No family at bedside; patient's wife was updated over the phone 12/09/20.  Consultants:  none  Code Status:  FULL  DVT Prophylaxis:  warfarin   Procedures: As Listed in Progress Note Above  Antibiotics: Ceftriaxone 12/6>>12/7 azithro 12/6>.12/7   Subjective: No events overnight.  Patient feeling weak, tired and deconditioned.  Reports breathing is a stable.  No chest pain, no nausea, no vomiting.  Objective: Vitals:   12/11/20 0454 12/11/20 0727 12/11/20 0729 12/11/20 1433  BP: 116/64  130/74 119/81  Pulse: 69  74 78  Resp: 18  19 18   Temp: (!) 97.5 F (36.4 C)  97.7 F (36.5 C) 97.8 F (36.6 C)  TempSrc: Oral  Oral Oral  SpO2: 93% 93% 93% 92%  Weight:      Height:        Intake/Output Summary (Last 24 hours) at 12/11/2020 1718 Last data filed at 12/11/2020 1209 Gross per 24 hour  Intake 360 ml  Output 350 ml  Net 10 ml   Weight change:   Exam: General exam: Alert, awake, oriented x 2; resting comfortable and in no acute distress.  Patient denies chest pain, no nausea, no vomiting, no fever.  Expressed that his breathing is a stable.  Good saturation on chronic 3 L supplementation. Respiratory system: Positive scattered rhonchi bilaterally appreciated on examination; no wheezing, no crackles.  No using accessory muscles. Cardiovascular system:RRR. No rubs or gallops; no JVD. Gastrointestinal system: Abdomen is nondistended, soft and nontender. No organomegaly or masses felt. Normal bowel sounds heard. Central nervous system: Alert and oriented. No focal neurological deficits. Extremities: No cyanosis or clubbing. Skin: No petechiae. Psychiatry: Judgement and insight appear normal. Mood & affect appropriate.    Data Reviewed: I have personally reviewed following labs and imaging studies  Basic Metabolic Panel: Recent Labs  Lab 12/05/20 0414 12/06/20 0452 12/07/20 0609  12/08/20 0752 12/10/20 0536  NA 138 138 139 139 141  K 4.5 4.6 4.4 4.5 4.2  CL 97* 96* 94* 98 96*  CO2 33* 36* 36* 34* 36*  GLUCOSE 127* 134* 137* 150* 110*  BUN 22 25* 28* 35* 39*  CREATININE 0.73 0.77 0.80 0.83 1.06  CALCIUM 9.1 9.0 9.2 8.9 8.9   Liver Function Tests: Recent Labs  Lab 12/05/20 0414 12/06/20 0452 12/07/20 0609 12/08/20 0752  AST 33 30 27 25   ALT 28 29 29 28   ALKPHOS 104 99 99 98  BILITOT 0.3 0.6 0.6 0.4  PROT 6.6 6.4* 6.8 6.6  ALBUMIN 2.9* 2.8* 3.1* 3.0*   Coagulation Profile: Recent Labs  Lab 12/07/20 0609 12/08/20 0752 12/09/20 0755 12/10/20 0536 12/11/20 0736  INR 2.4* 2.8* 3.0* 4.0* 3.4*   CBC: Recent Labs  Lab 12/05/20 0414 12/06/20 0452 12/07/20 0609 12/08/20 0752 12/10/20 0536  WBC 16.2* 14.0* 13.7* 15.9* 22.2*  NEUTROABS 13.6* 11.3* 10.3* 12.6*  --   HGB 15.8 16.3 17.2* 17.4* 18.4*  HCT 51.1 52.3* 54.3* 55.0* 58.5*  MCV 92.9 92.9 91.6 91.1 91.1  PLT 379 385 405* 420* 493*   Urine analysis:    Component Value Date/Time   COLORURINE YELLOW 12/04/2020 1700   APPEARANCEUR CLEAR 12/04/2020 1700   APPEARANCEUR Clear 07/13/2020 0000   LABSPEC 1.013 12/04/2020 1700   PHURINE 5.0 12/04/2020 1700   GLUCOSEU NEGATIVE 12/04/2020 1700   HGBUR NEGATIVE 12/04/2020 1700   BILIRUBINUR NEGATIVE 12/04/2020 1700   BILIRUBINUR negative 11/29/2020  [employer] Negative 07/13/2020 0000   KETONESUR NEGATIVE 12/04/2020 1700   PROTEINUR NEGATIVE 12/04/2020 1700   UROBILINOGEN 0.2 11/29/2020 1313   UROBILINOGEN 1.0 01/31/2013 1035   NITRITE NEGATIVE 12/04/2020 1700   LEUKOCYTESUR NEGATIVE 12/04/2020 1700   Sepsis Labs:  Recent Results (from the past 240 hour(s))  COVID-19, Flu A+B and RSV ([facility])     Status: Abnormal   Collection Time: 12/02/20 10:19 AM  Result Value Ref Range Status   SARS-CoV-2, NAA Detected (A) Not Detected Final    Comment: Patients who have a positive COVID-19 test result may now have treatment options.  Treatment options are available for patients with mild to moderate symptoms and for hospitalized patients. Visit our website at [email or web address] for resources and information. This nucleic acid amplification test was developed and its performance characteristics determined by [employer]. Nucleic acid amplification tests include RT-PCR and TMA. This test has not been FDA cleared or approved. This test has been authorized by FDA under an Emergency Use Authorization (EUA). This test is only authorized for the duration of time the declaration that circumstances exist justifying the authorization of the emergency use of in vitro diagnostic tests for detection of SARS-CoV-2 virus and/or diagnosis of COVID-19 infection under section 564(b)(1) of the Act, 21 U.S.C. 947SJG-2(E) (1), unless the authorization is terminated or revoked sooner. When diagnostic testing is negativ e, the possibility of a false negative result should be considered in the context of a patient's recent exposures and the presence of clinical signs and symptoms consistent with COVID-19. An individual without symptoms of COVID-19 and who is not shedding SARS-CoV-2 virus would expect to have a negative (not detected) result in this assay.    Influenza A, NAA Not Detected Not Detected Final   Influenza B, NAA Not Detected Not Detected Final   RSV, NAA Not Detected Not Detected Final  Resp Panel by RT-PCR (Flu A&B, Covid) Nasopharyngeal Swab     Status: Abnormal   Collection Time: 12/03/20  1:59 AM   Specimen: Nasopharyngeal Swab; Nasopharyngeal(NP) swabs in vial transport medium  Result Value Ref Range Status   SARS Coronavirus 2 by RT PCR POSITIVE (A) NEGATIVE Final    Comment: RESULT CALLED TO, READ BACK BY AND VERIFIED WITH: EASTER,T @ 0316 ON 12/03/20 BY JUW (NOTE) SARS-CoV-2 target nucleic acids are DETECTED.  The SARS-CoV-2 RNA is generally detectable in upper respiratory specimens during  the acute phase of infection. Positive results are indicative of the presence of the identified virus, but do not rule out bacterial infection or co-infection with other pathogens not detected by the test. Clinical correlation with patient history and other diagnostic information is necessary to determine patient infection status. The expected result is Negative.  Fact Sheet for Patients: EntrepreneurPulse.com.au  Fact Sheet for Healthcare Providers: IncredibleEmployment.be  This test is not yet approved or cleared by the [place] FDA and  has been authorized for detection and/or diagnosis of SARS-CoV-2 by FDA under an Emergency Use Authorization (EUA).  This EUA will remain in effect (meaning this test can be  used) for the duration of  the COVID-19 declaration under Section 564(b)(1) of the Act, 21 U.S.C. section 360bbb-3(b)(1), unless the authorization is terminated or revoked sooner.     Influenza A by PCR NEGATIVE NEGATIVE Final   Influenza B by PCR NEGATIVE NEGATIVE Final    Comment: (NOTE) The Xpert Xpress SARS-CoV-2/FLU/RSV plus assay is intended as an aid in the diagnosis of influenza from Nasopharyngeal swab specimens  and should not be used as a sole basis for treatment. Nasal washings and aspirates are unacceptable for Xpert Xpress SARS-CoV-2/FLU/RSV testing.  Fact Sheet for Patients: EntrepreneurPulse.com.au  Fact Sheet for Healthcare Providers: IncredibleEmployment.be  This test is not yet approved or cleared by the [place] FDA and has been authorized for detection and/or diagnosis of SARS-CoV-2 by FDA under an Emergency Use Authorization (EUA). This EUA will remain in effect (meaning this test can be used) for the duration of the COVID-19 declaration under Section 564(b)(1) of the Act, 21 U.S.C. section 360bbb-3(b)(1), unless the authorization is terminated  or revoked.  Performed at [facility], [address], [place], [place] [address]   Blood Culture (routine x 2)     Status: None   Collection Time: 12/03/20  2:58 AM   Specimen: Right Antecubital; Blood  Result Value Ref Range Status   Specimen Description RIGHT ANTECUBITAL  Final   Special Requests   Final    BOTTLES DRAWN AEROBIC ONLY Blood Culture results may not be optimal due to an inadequate volume of blood received in culture bottles   Culture   Final    NO GROWTH 5 DAYS Performed at [facility], [address], [place], [place] [address]    Report Status 12/08/2020 FINAL  Final  Blood Culture (routine x 2)     Status: None   Collection Time: 12/03/20  3:45 AM   Specimen: BLOOD RIGHT HAND  Result Value Ref Range Status   Specimen Description BLOOD RIGHT HAND  Final   Special Requests   Final    BOTTLES DRAWN AEROBIC AND ANAEROBIC Blood Culture adequate volume   Culture   Final    NO GROWTH 5 DAYS Performed at [facility], [address] [place], [place] [address]    Report Status 12/08/2020 FINAL  Final  Culture, Urine     Status: None   Collection Time: 12/04/20  5:00 PM   Specimen: Urine, Random  Result Value Ref Range Status   Specimen Description   Final    URINE, RANDOM Performed at [facility], [address] [place], [place] [address]    Special Requests   Final    NONE Performed at [facility], [address] [place], [place] [address]    Culture   Final    NO GROWTH Performed at [facility], [place] [address], [place], [place] [address]    Report Status 12/06/2020 FINAL  Final     Scheduled Meds: . aerochamber Z-Stat Plus/medium  1 each Other Once  . vitamin C  500 mg Oral Daily  . atorvastatin  80 mg Oral Q M,W,F  . cholecalciferol  2,000 Units Oral Daily  . dextromethorphan-guaiFENesin  1 tablet Oral BID  . finasteride  5 mg Oral Daily  . furosemide  20 mg Oral Daily  . Ipratropium-Albuterol  1 puff Inhalation BID  . metoprolol  tartrate  37.5 mg Oral BID  . mirabegron ER  50 mg Oral Daily  . multivitamin with minerals  1 tablet Oral Daily  . predniSONE  40 mg Oral Q breakfast  . sertraline  100 mg Oral Daily  . tamsulosin  0.4 mg Oral QPC supper  . Warfarin - Pharmacist Dosing Inpatient   Does not apply q1600  . zinc sulfate  220 mg Oral Daily   Continuous Infusions:   Procedures/Studies: DG Chest 2 View  Result Date: 12/02/2020 CLINICAL DATA:  Cough and shortness of breath for 3 days EXAM:  CHEST - 2 VIEW COMPARISON:  07/20/2020 FINDINGS: Cardiac shadow is enlarged but stable. Defibrillator is again noted and stable. Bilateral scarring is seen with interstitial prominence stable from the prior study. No new focal infiltrate is seen. No bony abnormality is noted. IMPRESSION: Chronic scarring bilaterally.  No acute abnormality seen. Electronically Signed   By: Inez Catalina M.D.   On: 12/02/2020 10:02   DG Chest Port 1 View  Result Date: 12/03/2020 CLINICAL DATA:  Shortness of breath and cough. EXAM: PORTABLE CHEST 1 VIEW COMPARISON:  December 02, 2020 FINDINGS: There is an unchanged appearance of the right-sided ICD. The heart size is enlarged. The lung volumes are low. Linear airspace opacities are again noted bilaterally. There is no pneumothorax. There is no large pleural effusion. No acute osseous abnormality. There are multiple old left-sided rib fractures. IMPRESSION: No active disease. Electronically Signed   By: Constance Holster M.D.   On: 12/03/2020 02:23   CUP PACEART INCLINIC DEVICE CHECK  Result Date: 11/21/2020 ICD check in clinic. Normal device function. Thresholds and sensing consistent with previous device measurements. Impedance trends stable over time.  2 NSVT episode, seen on previous remotes, < 20 beats. Histogram distribution appropriate for patient and  level of activity. No changes made this session. Device programmed at appropriate safety margins. Device programmed to optimize intrinsic  conduction. Estimated longevity 3 years. Pt enrolled in remote follow-up 12/31/20. Patient education completed. ROV w/ SK.Lavenia Atlas, BSN, RN   Barton Dubois, MD [facility]   12/11/2020, 5:18 PM   LOS: 8 days
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
## 2020-12-11 NOTE — Plan of Care (Signed)

## 2020-12-12 LAB — PROTIME-INR
INR: 3 — ABNORMAL HIGH (ref 0.8–1.2)
Prothrombin Time: 30.1 seconds — ABNORMAL HIGH (ref 11.4–15.2)

## 2020-12-12 MED ORDER — WARFARIN SODIUM 1 MG PO TABS
1.0000 mg | ORAL_TABLET | Freq: Once | ORAL | Status: AC
  Administered 2020-12-12: 1 mg via ORAL
  Filled 2020-12-12: qty 1

## 2020-12-12 NOTE — Progress Notes (Signed)
Pt fed self breakfast this am after set-up by staff. Ate 75%. Pt has been dozing this am since breakfast. In for rounds, pt agreeable to getting OOB and up in chair. Pt able to maneuver self to side of bed and using walker, stood with and ambulated 2 feet to chair with only standby assistance. Eased self into chair. Tolerated well, no increased resp diff noted. Pt says, "That feels good!". Chair alarm intact and activated. Phone, call bell, water and tissues on over bed table sitting across pt's lap. Pt watching TV at this time, denies c/o.

## 2020-12-12 NOTE — Progress Notes (Signed)
PROGRESS NOTE  Brayan Votaw OEV:[id number] DOB: [birth year] DOA: 12/03/2020 PCP: Dettinger, Fransisca Kaufmann, MD  Brief History:  74 year old male with a history of dementia, systolic and diastolic CHF, DVT/PE on warfarin, COPD, V. fib arrest status post AICD, hypertension, ischemic cardiomyopathy, lung cancer, chronic respiratory failure on 3 L, and stroke presenting with 5 to 6-day history of increasing confusion, low-grade fever and progressive dyspnea on exertion.  The patient is unable to provide any history.  All of history is obtained from review of the medical record and speaking with patient's spouse.  The patient has had increasing somnolence and decreasing oral intake.  The patient spouse took him to urgent care on 11/29/20 at which time a UA was unremarkable.  Home Covid test on 12/01/2020 was negative.  However, the patient continued to have low-grade fevers up to 100.9 F at home.  In addition, the patient's spouse also noted his oxygen saturation running in the low 90s, 90-92% on his usual 3 L at home. In the emergency department, the patient was febrile up to 1 to 2.4 F with tachycardia.  Oxygen saturation was 94-96% on 4 L.  WBC 15.2, hemoglobin 15.1, platelets 349,000.  Assessment/Plan: Acute on chronic respiratory failure with hypoxia--secondary to COVID-19 pneumonia -Currently back to his chronic 3 L nasal cannula supplementation with good saturation -Still with intermittent coughing spells (no sputum production); feeling short winded with activity. -Patient has completed 5 days of remdesivir infusion on 12/07/2020 -Will continue a slow steroids tapering. -Continue vitamin C and zinc. -PCT 0.11-->NEG; antibiotic has been discontinued. -Continue supportive care, incentive respirometer, flutter valve and as needed antitussive medication.  Acute metabolic encephalopathy -Secondary to COVID-19 infection -12/2 UA--no pyuria -overall improved, near baseline now -Continue  constant reorientation.  Systolic and diastolic CHF-chronic -Appears clinically compensated -03/03/2019 echo EF >60 percent -Continue to follow Daily weights -Continue furosemide -Follow strict I's and O's  COPD exacerbation -Intermittent wheezing has been described -Reporting intermittent coughing spells and very little difficulty speaking in full sentences currently. -Continue steroids and bronchodilator therapy.  Dementia without behavior disturbance -at high risk for hospital delirium -Continue constant reorientation and supportive care.    Coronary artery disease -No chest pain presently -Personally reviewed EKG--sinus rhythm, no ST-T wave change  Hyperlipidemia -Continue statin -Heart healthy diet has been encouraged.  History of DVT and PE -Pharmacy to dose  Essential hypertension -Continue metoprolol tartrate -Stable blood pressure.  Depression/anxiety -Continue Zoloft -Continue constant reorientation -Mood stable currently.  History of V. fib arrest January 2014/ischemic cardiomyopathy -Status post AICD -Continue telemetry monitoring -Patient denies palpitations.  Physical Deconditioning -PT eval>>SNF -Family in agreement -Patient has been clinically stable for discharge since 12/07/2020; at this time awaiting insurance authorization.  Supratherapeutic INR -INR  Trend: 4.2>3.1> 2.4> 2.8> 3> 4> 3.4 > 3.0  theraputic today -Continue to follow trend -Pharmacy helping with dose adjustment.  Status is: Inpatient    Dispo: The patient is from: Home              Anticipated d/c is to: Home              Anticipated d/c date is: 1 day (waiting on insurance authorization/nursing home availability).              Patient currently medically stable for discharge to skilled nursing facility.  Still mildly short of breath with exertion; but down to his baseline 3 L chronic oxygen supplementation.  Patient is  in no distress.  He has completed 5 days of  remdesivir infusion and is tolerating oral steroids with tapering plan. TOC assisting with patient's discharge.  Family Communication:   No family at bedside; patient's wife was updated over the phone 12/09/20.  Consultants:  none  Code Status:  FULL  DVT Prophylaxis:  warfarin   Procedures: As Listed in Progress Note Above  Antibiotics: Ceftriaxone 12/6>>12/7 azithro 12/6>.12/7   Subjective: No overnight events; continue to feel weak and deconditioned.  No chest pain, no nausea, no vomiting.  Patient is afebrile good oxygen saturation on 3 L nasal supplementation.  Objective: Vitals:   12/11/20 2142 12/12/20 0428 12/12/20 0732 12/12/20 0935  BP: 121/74 130/73  132/66  Pulse: (!) 57 80  85  Resp: 19 17  18   Temp: 98.6 F (37 C) (!) 97.5 F (36.4 C)    TempSrc:  Oral    SpO2: 94% 97% 97% 94%  Weight:      Height:        Intake/Output Summary (Last 24 hours) at 12/12/2020 1539 Last data filed at 12/12/2020 1045 Gross per 24 hour  Intake 360 ml  Output 350 ml  Net 10 ml   Weight change:   Exam: General exam: Afebrile, no acute complaints; no overnight events.  Denies chest pain, no nausea, no vomiting, no fever.  Good saturation on 3 L supplementation. Respiratory system: Positive scattered rhonchi; no wheezing, no crackles, no using accessory muscles. Cardiovascular system:RRR. No murmurs, rubs, gallops.  No JVD on exam. Gastrointestinal system: Abdomen is nondistended, soft and nontender. No organomegaly or masses felt. Normal bowel sounds heard. Central nervous system: No focal neurological deficits. Extremities: No cyanosis or clubbing. Skin: No petechiae. Psychiatry: Judgement and insight appear normal. Mood & affect appropriate.    Data Reviewed: I have personally reviewed following labs and imaging studies  Basic Metabolic Panel: Recent Labs  Lab 12/06/20 0452 12/07/20 0609 12/08/20 0752 12/10/20 0536  NA 138 139 139 141  K 4.6 4.4 4.5 4.2  CL  96* 94* 98 96*  CO2 36* 36* 34* 36*  GLUCOSE 134* 137* 150* 110*  BUN 25* 28* 35* 39*  CREATININE 0.77 0.80 0.83 1.06  CALCIUM 9.0 9.2 8.9 8.9   Liver Function Tests: Recent Labs  Lab 12/06/20 0452 12/07/20 0609 12/08/20 0752  AST 30 27 25   ALT 29 29 28   ALKPHOS 99 99 98  BILITOT 0.6 0.6 0.4  PROT 6.4* 6.8 6.6  ALBUMIN 2.8* 3.1* 3.0*   Coagulation Profile: Recent Labs  Lab 12/08/20 0752 12/09/20 0755 12/10/20 0536 12/11/20 0736 12/12/20 0423  INR 2.8* 3.0* 4.0* 3.4* 3.0*   CBC: Recent Labs  Lab 12/06/20 0452 12/07/20 0609 12/08/20 0752 12/10/20 0536  WBC 14.0* 13.7* 15.9* 22.2*  NEUTROABS 11.3* 10.3* 12.6*  --   HGB 16.3 17.2* 17.4* 18.4*  HCT 52.3* 54.3* 55.0* 58.5*  MCV 92.9 91.6 91.1 91.1  PLT 385 405* 420* 493*   Urine analysis:    Component Value Date/Time   COLORURINE YELLOW 12/04/2020 1700   APPEARANCEUR CLEAR 12/04/2020 1700   APPEARANCEUR Clear 07/13/2020 0000   LABSPEC 1.013 12/04/2020 1700   PHURINE 5.0 12/04/2020 1700   GLUCOSEU NEGATIVE 12/04/2020 1700   HGBUR NEGATIVE 12/04/2020 1700   BILIRUBINUR NEGATIVE 12/04/2020 1700   BILIRUBINUR negative 11/29/2020 1313   BILIRUBINUR Negative 07/13/2020 0000   KETONESUR NEGATIVE 12/04/2020 1700   PROTEINUR NEGATIVE 12/04/2020 1700   UROBILINOGEN 0.2 11/29/2020 1313   UROBILINOGEN 1.0 01/31/2013  [place] 12/04/2020 [place] 12/04/2020 1700   Sepsis Labs:  Recent Results (from the past 240 hour(s))  Resp Panel by RT-PCR (Flu A&B, Covid) Nasopharyngeal Swab     Status: Abnormal   Collection Time: 12/03/20  1:59 AM   Specimen: Nasopharyngeal Swab; Nasopharyngeal(NP) swabs in vial transport medium  Result Value Ref Range Status   SARS Coronavirus 2 by RT PCR POSITIVE (A) NEGATIVE Final    Comment: RESULT CALLED TO, READ BACK BY AND VERIFIED WITH: EASTER,T @ 0316 ON 12/03/20 BY JUW (NOTE) SARS-CoV-2 target nucleic acids are DETECTED.  The SARS-CoV-2 RNA is  generally detectable in upper respiratory specimens during the acute phase of infection. Positive results are indicative of the presence of the identified virus, but do not rule out bacterial infection or co-infection with other pathogens not detected by the test. Clinical correlation with patient history and other diagnostic information is necessary to determine patient infection status. The expected result is Negative.  Fact Sheet for Patients: EntrepreneurPulse.com.au  Fact Sheet for Healthcare Providers: IncredibleEmployment.be  This test is not yet approved or cleared by the [place] FDA and  has been authorized for detection and/or diagnosis of SARS-CoV-2 by FDA under an Emergency Use Authorization (EUA).  This EUA will remain in effect (meaning this test can be  used) for the duration of  the COVID-19 declaration under Section 564(b)(1) of the Act, 21 U.S.C. section 360bbb-3(b)(1), unless the authorization is terminated or revoked sooner.     Influenza A by PCR NEGATIVE NEGATIVE Final   Influenza B by PCR NEGATIVE NEGATIVE Final    Comment: (NOTE) The Xpert Xpress SARS-CoV-2/FLU/RSV plus assay is intended as an aid in the diagnosis of influenza from Nasopharyngeal swab specimens and should not be used as a sole basis for treatment. Nasal washings and aspirates are unacceptable for Xpert Xpress SARS-CoV-2/FLU/RSV testing.  Fact Sheet for Patients: EntrepreneurPulse.com.au  Fact Sheet for Healthcare Providers: IncredibleEmployment.be  This test is not yet approved or cleared by the [place] FDA and has been authorized for detection and/or diagnosis of SARS-CoV-2 by FDA under an Emergency Use Authorization (EUA). This EUA will remain in effect (meaning this test can be used) for the duration of the COVID-19 declaration under Section 564(b)(1) of the Act, 21 U.S.C. section 360bbb-3(b)(1),  unless the authorization is terminated or revoked.  Performed at [facility], [address] [place], [place] [address]   Blood Culture (routine x 2)     Status: None   Collection Time: 12/03/20  2:58 AM   Specimen: Right Antecubital; Blood  Result Value Ref Range Status   Specimen Description RIGHT ANTECUBITAL  Final   Special Requests   Final    BOTTLES DRAWN AEROBIC ONLY Blood Culture results may not be optimal due to an inadequate volume of blood received in culture bottles   Culture   Final    NO GROWTH 5 DAYS Performed at [facility], [address], [place], [place] [address]    Report Status 12/08/2020 FINAL  Final  Blood Culture (routine x 2)     Status: None   Collection Time: 12/03/20  3:45 AM   Specimen: BLOOD RIGHT HAND  Result Value Ref Range Status   Specimen Description BLOOD RIGHT HAND  Final   Special Requests   Final    BOTTLES DRAWN AEROBIC AND ANAEROBIC Blood Culture adequate volume   Culture   Final    NO GROWTH 5 DAYS Performed at  [facility], [address] [place], [place] [address]    Report Status 12/08/2020 FINAL  Final  Culture, Urine     Status: None   Collection Time: 12/04/20  5:00 PM   Specimen: Urine, Random  Result Value Ref Range Status   Specimen Description   Final    URINE, RANDOM Performed at [facility], [address] [place], [place] [address]    Special Requests   Final    NONE Performed at [facility], [address] [place], [place] [address]    Culture   Final    NO GROWTH Performed at [facility], [place] [address] [place], [place] [address]    Report Status 12/06/2020 FINAL  Final     Scheduled Meds: . aerochamber Z-Stat Plus/medium  1 each Other Once  . vitamin C  500 mg Oral Daily  . atorvastatin  80 mg Oral Q M,W,F  . cholecalciferol  2,000 Units Oral Daily  . dextromethorphan-guaiFENesin  1 tablet Oral BID  . finasteride  5 mg Oral Daily  . furosemide  20 mg Oral Daily  . Ipratropium-Albuterol  1  puff Inhalation BID  . metoprolol tartrate  37.5 mg Oral BID  . mirabegron ER  50 mg Oral Daily  . multivitamin with minerals  1 tablet Oral Daily  . predniSONE  40 mg Oral Q breakfast  . sertraline  100 mg Oral Daily  . tamsulosin  0.4 mg Oral QPC supper  . warfarin  1 mg Oral ONCE-1600  . Warfarin - Pharmacist Dosing Inpatient   Does not apply q1600  . zinc sulfate  220 mg Oral Daily   Continuous Infusions:   Procedures/Studies: DG Chest 2 View  Result Date: 12/02/2020 CLINICAL DATA:  Cough and shortness of breath for 3 days EXAM: CHEST - 2 VIEW COMPARISON:  07/20/2020 FINDINGS: Cardiac shadow is enlarged but stable. Defibrillator is again noted and stable. Bilateral scarring is seen with interstitial prominence stable from the prior study. No new focal infiltrate is seen. No bony abnormality is noted. IMPRESSION: Chronic scarring bilaterally.  No acute abnormality seen. Electronically Signed   By: Inez Catalina M.D.   On: 12/02/2020 10:02   DG Chest Port 1 View  Result Date: 12/03/2020 CLINICAL DATA:  Shortness of breath and cough. EXAM: PORTABLE CHEST 1 VIEW COMPARISON:  December 02, 2020 FINDINGS: There is an unchanged appearance of the right-sided ICD. The heart size is enlarged. The lung volumes are low. Linear airspace opacities are again noted bilaterally. There is no pneumothorax. There is no large pleural effusion. No acute osseous abnormality. There are multiple old left-sided rib fractures. IMPRESSION: No active disease. Electronically Signed   By: Constance Holster M.D.   On: 12/03/2020 02:23   CUP PACEART INCLINIC DEVICE CHECK  Result Date: 11/21/2020 ICD check in clinic. Normal device function. Thresholds and sensing consistent with previous device measurements. Impedance trends stable over time.  2 NSVT episode, seen on previous remotes, < 20 beats. Histogram distribution appropriate for patient and  level of activity. No changes made this session. Device programmed at  appropriate safety margins. Device programmed to optimize intrinsic conduction. Estimated longevity 3 years. Pt enrolled in remote follow-up 12/31/20. Patient education completed. ROV w/ SK.Lavenia Atlas, BSN, RN   Barton Dubois, MD [facility]   12/12/2020, 3:39 PM   LOS: 9 days

## 2020-12-12 NOTE — Plan of Care (Signed)
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
## 2020-12-13 LAB — GLUCOSE, CAPILLARY: Glucose-Capillary: 139 mg/dL — ABNORMAL HIGH (ref 70–99)

## 2020-12-13 LAB — PROTIME-INR
INR: 2.3 — ABNORMAL HIGH (ref 0.8–1.2)
Prothrombin Time: 24.2 seconds — ABNORMAL HIGH (ref 11.4–15.2)

## 2020-12-13 MED ORDER — WARFARIN SODIUM 1 MG PO TABS
1.0000 mg | ORAL_TABLET | Freq: Once | ORAL | Status: AC
  Administered 2020-12-13: 1 mg via ORAL
  Filled 2020-12-13: qty 1
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
## 2020-12-13 NOTE — Progress Notes (Signed)
Pt assisted OOB and into chair. Pt able to move self from lying position to sitting on side of bed with only standby assist. Pt used FWW and 2 standby assist, able to stand and make 4 shuffling steps and turn to sit in chair. Tolerated well.

## 2020-12-13 NOTE — Progress Notes (Signed)
Physical Therapy Treatment Patient Details Name: Travis Cruz MRN: [id number] DOB: 1946-08-20 Today's Date: 12/13/2020    History of Present Illness Travis Cruz is a 73 y.o. male with medical history of dementia, systolic and diastolic CHF, DVT/PE on warfarin, COPD, V. fib arrest status post AICD, hypertension, ischemic cardiomyopathy, lung cancer, chronic respiratory failure on 3 L, and stroke presenting with 5 to 6-day history of increasing confusion, low-grade fever and progressive dyspnea on exertion.  The patient is unable to provide any history.  All of history is obtained from review of the medical record and speaking with patient's spouse.  The patient has had increasing somnolence and decreasing oral intake.  The patient spouse took him to urgent care on 11/29/20 at which time a UA was unremarkable.  Home Covid test on 12/01/2020 was negative.  However, the patient continued to have low-grade fevers up to 100.9 F at home.  In addition, the patient's spouse also noted his oxygen saturation running in the low 90s, 90-92% on his usual 3 L at home.In the emergency department, the patient was febrile up to 102.4 F with tachycardia.  Oxygen saturation was 94-96% on 4 L.  WBC 15.2, hemoglobin 15.1, platelets 349,000.  BMP was essentially unremarkable with serum creatinine 0.82.  LFTs were unremarkable.  Chest x-ray showed right lower lobe, right lower lobe, left lower lobe opacities which have been chronic.  The patient was started on dexamethasone and remdesivir.    PT Comments    Patient presents up in chair (assisted by nursing staff) and agreeable for therapy.  Patient very unsteady on feet and limited to taking steps at bedside, requires repeated verbal/tactile cueing to follow most instructions and unable to lift legs against gravity when getting back in bed.  Patient required Mod/max assist to reposition self when put back to bed.  Patient will benefit from continued physical  therapy in hospital and recommended venue below to increase strength, balance, endurance for safe ADLs and gait.    Follow Up Recommendations  SNF;Supervision for mobility/OOB;Supervision - Intermittent     Equipment Recommendations  None recommended by PT    Recommendations for Other Services       Precautions / Restrictions Precautions Precautions: Fall Restrictions Weight Bearing Restrictions: No    Mobility  Bed Mobility Overal bed mobility: Needs Assistance Bed Mobility: Sit to Supine     Supine to sit: Min assist     General bed mobility comments: required Min assist to move BLE when getting back in bed  Transfers Overall transfer level: Needs assistance Equipment used: Rolling walker (2 wheeled) Transfers: Sit to/from [facility] Sit to Stand: Min assist Stand pivot transfers: Min assist;Mod assist       General transfer comment: increased time, labored movement  Ambulation/Gait Ambulation/Gait assistance: Mod assist Gait Distance (Feet): 10 Feet Assistive device: Rolling walker (2 wheeled) Gait Pattern/deviations: Decreased step length - right;Decreased step length - left;Decreased stride length Gait velocity: decreased   General Gait Details: limited to taking slow labored steps at bedside due to fatigue and poor standing balance   Stairs             Wheelchair Mobility    Modified Rankin (Stroke Patients Only)       Balance Overall balance assessment: Needs assistance Sitting-balance support: Feet supported;No upper extremity supported Sitting balance-Leahy Scale: Fair Sitting balance - Comments: fair/good seated at EOB   Standing balance support: During functional activity;Bilateral upper extremity supported Standing balance-Leahy Scale: Fair  Standing balance comment: fair/poor using RW                            Cognition Arousal/Alertness: Awake/alert Behavior During Therapy: WFL for tasks  assessed/performed Overall Cognitive Status: History of cognitive impairments - at baseline                                        Exercises General Exercises - Lower Extremity Long Arc Quad: Seated;AROM;Both;Strengthening;10 reps Hip Flexion/Marching: Seated;AROM;Strengthening;Both;10 reps Heel Raises: Seated;AROM;Strengthening;Both;10 reps    General Comments        Pertinent Vitals/Pain Pain Assessment: No/denies pain    Home Living                      Prior Function            PT Goals (current goals can now be found in the care plan section) Acute Rehab PT Goals Patient Stated Goal: return home with family to assist PT Goal Formulation: With patient Time For Goal Achievement: 12/18/20 Potential to Achieve Goals: Good Progress towards PT goals: Progressing toward goals    Frequency    Min 3X/week      PT Plan      Co-evaluation              AM-PAC PT "6 Clicks" Mobility   Outcome Measure  Help needed turning from your back to your side while in a flat bed without using bedrails?: A Little Help needed moving from lying on your back to sitting on the side of a flat bed without using bedrails?: A Little Help needed moving to and from a bed to a chair (including a wheelchair)?: A Lot Help needed standing up from a chair using your arms (e.g., wheelchair or bedside chair)?: A Lot Help needed to walk in hospital room?: A Lot Help needed climbing 3-5 steps with a railing? : Total 6 Click Score: 13    End of Session Equipment Utilized During Treatment: Oxygen Activity Tolerance: Patient tolerated treatment well;Patient limited by fatigue Patient left: in chair;with call bell/phone within reach;with chair alarm set Nurse Communication: Mobility status PT Visit Diagnosis: Unsteadiness on feet (R26.81);Other abnormalities of gait and mobility (R26.89);Muscle weakness (generalized) (M62.81)     Time: 6599-3570 PT Time  Calculation (min) (ACUTE ONLY): 27 min  Charges:  $Therapeutic Exercise: 8-22 mins $Therapeutic Activity: 8-22 mins                     3:47 PM, 12/13/20 [name], MPT Physical Therapist with [facility] 336 [phone] office [phone] mobile phone

## 2020-12-13 NOTE — Progress Notes (Signed)
PROGRESS NOTE  Adolph Clutter VEL:[id number] DOB: 08/20/1946 DOA: 12/03/2020 PCP: Dettinger, Fransisca Kaufmann, MD  Brief History:  74 year old male with a history of dementia, systolic and diastolic CHF, DVT/PE on warfarin, COPD, V. fib arrest status post AICD, hypertension, ischemic cardiomyopathy, lung cancer, chronic respiratory failure on 3 L, and stroke presenting with 5 to 6-day history of increasing confusion, low-grade fever and progressive dyspnea on exertion.  The patient is unable to provide any history.  All of history is obtained from review of the medical record and speaking with patient's spouse.  The patient has had increasing somnolence and decreasing oral intake.  The patient spouse took him to urgent care on 11/29/20 at which time a UA was unremarkable.  Home Covid test on 12/01/2020 was negative.  However, the patient continued to have low-grade fevers up to 100.9 F at home.  In addition, the patient's spouse also noted his oxygen saturation running in the low 90s, 90-92% on his usual 3 L at home. In the emergency department, the patient was febrile up to 1 to 2.4 F with tachycardia.  Oxygen saturation was 94-96% on 4 L.  WBC 15.2, hemoglobin 15.1, platelets 349,000. --Awaiting insurance authorization for transfer to SNF rehab  Assessment/Plan: Acute on chronic respiratory failure with hypoxia--secondary to COVID-19 pneumonia -Currently back to his chronic 3 L nasal cannula supplementation with good saturation -Still with intermittent coughing spells (no sputum production); feeling short winded with activity. -Patient has completed 5 days of remdesivir infusion on 12/07/2020 continue a slow steroids tapering. -Continue vitamin C and zinc. -PCT 0.11-->NEG; antibiotic has been discontinued. -Continue supportive care, incentive respirometer, flutter valve and as needed antitussive medication.  Acute metabolic encephalopathy -Secondary to COVID-19 infection -12/2 UA--no  pyuria -overall improved, near baseline now -Continue constant reorientation.  Systolic and diastolic CHF-chronic -Appears clinically compensated -03/03/2019 echo EF >60 percent -Continue to follow Daily weights -Continue furosemide -Follow strict I's and O's  COPD exacerbation --Reporting intermittent coughing spells and very little difficulty speaking in full sentences currently. -Continue steroids and bronchodilator therapy. -Overall improved  Dementia without behavior disturbance -at high risk for hospital delirium -Continue constant reorientation and supportive care.    Coronary artery disease -No chest pain presently - EKG--sinus rhythm, no ST-T wave change  Hyperlipidemia -Continue statin -Heart healthy diet has been encouraged.  History of DVT and PE -Coumadin as per pharmacy    Essential hypertension -Continue metoprolol tartrate -Stable blood pressure.  Depression/anxiety -Continue Zoloft -Continue constant reorientation -Mood stable currently.  History of V. fib arrest January 2014/ischemic cardiomyopathy -Status post AICD -Continue telemetry monitoring -Patient denies palpitations.  Physical Deconditioning -PT eval>>SNF -Family in agreement -Patient has been clinically stable for discharge since 12/07/2020; at this time awaiting insurance authorization. .  Status is: Inpatient    Dispo: The patient is from: Home              Anticipated d/c is to: Home              Anticipated d/c date is: 1 day (waiting on insurance authorization/nursing home availability).              Patient currently medically stable for discharge to skilled nursing facility.  Still mildly short of breath with exertion; but down to his baseline 3 L chronic oxygen supplementation.  Patient is in no distress.  He has completed 5 days of remdesivir infusion and is tolerating oral steroids with tapering  plan. TOC assisting with patient's discharge. ---Awaiting insurance  authorization for transfer to SNF rehab  Family Communication:   No family at bedside; patient's wife was updated over the phone 12/09/20.  Consultants:  none  Code Status:  FULL  DVT Prophylaxis:  warfarin   Procedures: As Listed in Progress Note Above  Antibiotics: Ceftriaxone 12/6>>12/7 azithro 12/6>.12/7   Subjective:  -No new concerns, Oral intake is fair --Awaiting insurance authorization for transfer to SNF rehab  Objective: Vitals:   12/13/20 0633 12/13/20 1043 12/13/20 1109 12/13/20 1422  BP: 104/82  108/68 114/78  Pulse: 74  75 80  Resp: 17   18  Temp: (!) 97.5 F (36.4 C)   (!) 97.5 F (36.4 C)  TempSrc:    Oral  SpO2: 97% 96%  93%  Weight:      Height:        Intake/Output Summary (Last 24 hours) at 12/13/2020 1702 Last data filed at 12/13/2020 1300 Gross per 24 hour  Intake 240 ml  Output 375 ml  Net -135 ml   Weight change:   Exam: General exam: Afebrile, no acute complaints; no overnight events.   Nose-[place] 3L/min  Respiratory system: Fair air movement, few scattered rhonchi  cardiovascular system:RRR. No murmurs, rubs, gallops.  No JVD on exam. Gastrointestinal system: Abdomen is nondistended, soft and nontender.   Normal bowel sounds heard. Central nervous system: No focal neurological deficits. Extremities: No cyanosis or clubbing. Skin: No petechiae. Psychiatry: Some baseline memory and cognitive deficits, occasional episodes of disorientation, no agitation.    Data Reviewed: I have personally reviewed following labs and imaging studies  Basic Metabolic Panel: Recent Labs  Lab 12/07/20 0609 12/08/20 0752 12/10/20 0536  NA 139 139 141  K 4.4 4.5 4.2  CL 94* 98 96*  CO2 36* 34* 36*  GLUCOSE 137* 150* 110*  BUN 28* 35* 39*  CREATININE 0.80 0.83 1.06  CALCIUM 9.2 8.9 8.9   Liver Function Tests: Recent Labs  Lab 12/07/20 0609 12/08/20 0752  AST 27 25  ALT 29 28  ALKPHOS 99 98  BILITOT 0.6 0.4  PROT 6.8 6.6  ALBUMIN  3.1* 3.0*   Coagulation Profile: Recent Labs  Lab 12/09/20 0755 12/10/20 0536 12/11/20 0736 12/12/20 0423 12/13/20 0547  INR 3.0* 4.0* 3.4* 3.0* 2.3*   CBC: Recent Labs  Lab 12/07/20 0609 12/08/20 0752 12/10/20 0536  WBC 13.7* 15.9* 22.2*  NEUTROABS 10.3* 12.6*  --   HGB 17.2* 17.4* 18.4*  HCT 54.3* 55.0* 58.5*  MCV 91.6 91.1 91.1  PLT 405* 420* 493*   Urine analysis:    Component Value Date/Time   COLORURINE YELLOW 12/04/2020 1700   APPEARANCEUR CLEAR 12/04/2020 1700   APPEARANCEUR Clear 07/13/2020 0000   LABSPEC 1.013 12/04/2020 1700   PHURINE 5.0 12/04/2020 1700   GLUCOSEU NEGATIVE 12/04/2020 1700   HGBUR NEGATIVE 12/04/2020 1700   BILIRUBINUR NEGATIVE 12/04/2020 1700   BILIRUBINUR negative 11/29/2020 1313   BILIRUBINUR Negative 07/13/2020 0000   KETONESUR NEGATIVE 12/04/2020 1700   PROTEINUR NEGATIVE 12/04/2020 1700   UROBILINOGEN 0.2 11/29/2020 1313   UROBILINOGEN 1.0 01/31/2013 1035   NITRITE NEGATIVE 12/04/2020 1700   LEUKOCYTESUR NEGATIVE 12/04/2020 1700   Sepsis Labs:  Recent Results (from the past 240 hour(s))  Culture, Urine     Status: None   Collection Time: 12/04/20  5:00 PM   Specimen: Urine, Random  Result Value Ref Range Status   Specimen Description   Final    URINE, RANDOM Performed  at [facility], [address], [place], [place] [address]    Special Requests   Final    NONE Performed at [facility], [address], [place], [place] [address]    Culture   Final    NO GROWTH Performed at [facility], [place] [address] [place], [place] [address]    Report Status 12/06/2020 FINAL  Final     Scheduled Meds: . aerochamber Z-Stat Plus/medium  1 each Other Once  . vitamin C  500 mg Oral Daily  . atorvastatin  80 mg Oral Q M,W,F  . cholecalciferol  2,000 Units Oral Daily  . dextromethorphan-guaiFENesin  1 tablet Oral BID  . finasteride  5 mg Oral Daily  . furosemide  20 mg Oral Daily  . metoprolol tartrate  37.5 mg Oral BID   . mirabegron ER  50 mg Oral Daily  . multivitamin with minerals  1 tablet Oral Daily  . predniSONE  40 mg Oral Q breakfast  . sertraline  100 mg Oral Daily  . tamsulosin  0.4 mg Oral QPC supper  . warfarin  1 mg Oral ONCE-1600  . Warfarin - Pharmacist Dosing Inpatient   Does not apply q1600  . zinc sulfate  220 mg Oral Daily   Continuous Infusions:   Procedures/Studies: DG Chest 2 View  Result Date: 12/02/2020 CLINICAL DATA:  Cough and shortness of breath for 3 days EXAM: CHEST - 2 VIEW COMPARISON:  07/20/2020 FINDINGS: Cardiac shadow is enlarged but stable. Defibrillator is again noted and stable. Bilateral scarring is seen with interstitial prominence stable from the prior study. No new focal infiltrate is seen. No bony abnormality is noted. IMPRESSION: Chronic scarring bilaterally.  No acute abnormality seen. Electronically Signed   By: Inez Catalina M.D.   On: 12/02/2020 10:02   DG Chest Port 1 View  Result Date: 12/03/2020 CLINICAL DATA:  Shortness of breath and cough. EXAM: PORTABLE CHEST 1 VIEW COMPARISON:  December 02, 2020 FINDINGS: There is an unchanged appearance of the right-sided ICD. The heart size is enlarged. The lung volumes are low. Linear airspace opacities are again noted bilaterally. There is no pneumothorax. There is no large pleural effusion. No acute osseous abnormality. There are multiple old left-sided rib fractures. IMPRESSION: No active disease. Electronically Signed   By: Constance Holster M.D.   On: 12/03/2020 02:23   CUP PACEART INCLINIC DEVICE CHECK  Result Date: 11/21/2020 ICD check in clinic. Normal device function. Thresholds and sensing consistent with previous device measurements. Impedance trends stable over time.  2 NSVT episode, seen on previous remotes, < 20 beats. Histogram distribution appropriate for patient and  level of activity. No changes made this session. Device programmed at appropriate safety margins. Device programmed to optimize intrinsic  conduction. Estimated longevity 3 years. Pt enrolled in remote follow-up 12/31/20. Patient education completed. ROV w/ SK.Lavenia Atlas, BSN, RN   Roxan Hockey, MD [facility]   12/13/2020, 5:02 PM   LOS: 10 days

## 2020-12-14 LAB — PROTIME-INR
INR: 1.9 — ABNORMAL HIGH (ref 0.8–1.2)
Prothrombin Time: 20.7 seconds — ABNORMAL HIGH (ref 11.4–15.2)

## 2020-12-14 MED ORDER — PREDNISONE 20 MG PO TABS
30.0000 mg | ORAL_TABLET | Freq: Every day | ORAL | Status: DC
  Administered 2020-12-15 – 2020-12-16 (×2): 30 mg via ORAL
  Filled 2020-12-14 (×2): qty 1

## 2020-12-14 MED ORDER — WARFARIN SODIUM 1 MG PO TABS
1.5000 mg | ORAL_TABLET | Freq: Once | ORAL | Status: AC
  Administered 2020-12-14: 1.5 mg via ORAL
  Filled 2020-12-14: qty 2
  Filled 2020-12-14: qty 1.5
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
## 2020-12-14 NOTE — Progress Notes (Signed)
PROGRESS NOTE  Travis Cruz HBZ:[id number] DOB: 12-20-1946 DOA: 12/03/2020 PCP: Dettinger, Fransisca Kaufmann, MD  Brief History:  74 year old male with a history of dementia, systolic and diastolic CHF, DVT/PE on warfarin, COPD, V. fib arrest status post AICD, hypertension, ischemic cardiomyopathy, lung cancer, chronic respiratory failure on 3 L, and stroke presenting with 5 to 6-day history of increasing confusion, low-grade fever and progressive dyspnea on exertion.  The patient is unable to provide any history.  All of history is obtained from review of the medical record and speaking with patient's spouse.  The patient has had increasing somnolence and decreasing oral intake.  The patient spouse took him to urgent care on 11/29/20 at which time a UA was unremarkable.  Home Covid test on 12/01/2020 was negative.  However, the patient continued to have low-grade fevers up to 100.9 F at home.  In addition, the patient's spouse also noted his oxygen saturation running in the low 90s, 90-92% on his usual 3 L at home. In the emergency department, the patient was febrile up to 1 to 2.4 F with tachycardia.  Oxygen saturation was 94-96% on 4 L.  WBC 15.2, hemoglobin 15.1, platelets 349,000. --Awaiting insurance authorization for transfer to SNF rehab  Assessment/Plan: Acute on chronic respiratory failure with hypoxia--secondary to COVID-19 pneumonia -Currently back to his chronic 3 L nasal cannula supplementation with good saturation -Still with intermittent coughing spells (no sputum production); feeling short winded with activity. -Patient has completed 5 days of remdesivir infusion on 12/07/2020 continue a slow steroids tapering. -Continue vitamin C and zinc. -PCT 0.11-->NEG; antibiotic has been discontinued. -Continue supportive care, incentive respirometer, flutter valve and as needed antitussive medication.  Acute metabolic encephalopathy -Secondary to COVID-19 infection -12/2 UA--no  pyuria -overall improved, near baseline now -Continue constant reorientation.  Systolic and diastolic CHF-chronic -Appears clinically compensated -03/03/2019 echo EF >60 percent -Continue to follow Daily weights -Continue furosemide -Follow strict I's and O's  COPD exacerbation --Reporting intermittent coughing spells and very little difficulty speaking in full sentences currently. -Continue steroids and bronchodilator therapy. -Overall improved  Dementia without behavior disturbance -at high risk for hospital delirium -Continue constant reorientation and supportive care.    Coronary artery disease -No chest pain presently - EKG--sinus rhythm, no ST-T wave change  Hyperlipidemia -Continue statin -Heart healthy diet has been encouraged.  History of DVT and PE -Coumadin as per pharmacy    Essential hypertension -Continue metoprolol tartrate -Stable blood pressure.  Depression/anxiety -Continue Zoloft -Continue constant reorientation -Mood stable currently.  History of V. fib arrest January 2014/ischemic cardiomyopathy -Status post AICD -Continue telemetry monitoring -Patient denies palpitations.  Physical Deconditioning -PT eval>>SNF -Family in agreement -Patient has been clinically stable for discharge since 12/07/2020; at this time awaiting insurance authorization. .  Status is: Inpatient    Dispo: The patient is from: Home              Anticipated d/c is to: Home              Anticipated d/c date is: 1 day (waiting on insurance authorization/nursing home availability).              Patient currently medically stable for discharge to skilled nursing facility.  Still mildly short of breath with exertion; but down to his baseline 3 L chronic oxygen supplementation.  Patient is in no distress.  He has completed 5 days of remdesivir infusion and is tolerating oral steroids with tapering  plan. TOC assisting with patient's discharge. ---Awaiting insurance  authorization for transfer to SNF rehab  Family Communication:   I called and updated his wife by phone-- Ms [name] [phone]  Consultants:  none  Code Status:  FULL  DVT Prophylaxis:  warfarin   Procedures: As Listed in Progress Note Above  Antibiotics: Ceftriaxone 12/6>>12/7 azithro 12/6>.12/7   Subjective:  -No new concerns, Oral intake is fair I called and updated his wife by phone-- Ms [name] Notz---[phone]  --Awaiting insurance authorization for transfer to SNF rehab  Objective: Vitals:   12/13/20 2039 12/14/20 0628 12/14/20 0803 12/14/20 1358  BP: 114/64 119/83 117/79 116/74  Pulse: 79 62 63 72  Resp: 18 15 16 20   Temp: 97.9 F (36.6 C) 97.6 F (36.4 C) 97.7 F (36.5 C) 97.8 F (36.6 C)  TempSrc: Oral Oral Oral Oral  SpO2: 94% 94% 93% 95%  Weight:      Height:        Intake/Output Summary (Last 24 hours) at 12/14/2020 1537 Last data filed at 12/14/2020 1430 Gross per 24 hour  Intake 720 ml  Output 1900 ml  Net -1180 ml   Weight change:   Exam: General exam: Afebrile, no acute complaints; no overnight events.   Nose-[name] 3L/min  Respiratory system: Fair air movement, few scattered rhonchi  cardiovascular system:RRR. No murmurs, rubs, gallops.  No JVD on exam. Gastrointestinal system: Abdomen is nondistended, soft and nontender.   Normal bowel sounds heard. Central nervous system: No focal neurological deficits. Extremities: No cyanosis or clubbing. Skin: No petechiae. Psychiatry: Some baseline memory and cognitive deficits, occasional episodes of disorientation, no agitation.    Data Reviewed: I have personally reviewed following labs and imaging studies  Basic Metabolic Panel: Recent Labs  Lab 12/08/20 0752 12/10/20 0536  NA 139 141  K 4.5 4.2  CL 98 96*  CO2 34* 36*  GLUCOSE 150* 110*  BUN 35* 39*  CREATININE 0.83 1.06  CALCIUM 8.9 8.9   Liver Function Tests: Recent Labs  Lab 12/08/20 0752  AST 25  ALT 28   ALKPHOS 98  BILITOT 0.4  PROT 6.6  ALBUMIN 3.0*   Coagulation Profile: Recent Labs  Lab 12/10/20 0536 12/11/20 0736 12/12/20 0423 12/13/20 0547 12/14/20 0615  INR 4.0* 3.4* 3.0* 2.3* 1.9*   CBC: Recent Labs  Lab 12/08/20 0752 12/10/20 0536  WBC 15.9* 22.2*  NEUTROABS 12.6*  --   HGB 17.4* 18.4*  HCT 55.0* 58.5*  MCV 91.1 91.1  PLT 420* 493*   Urine analysis:    Component Value Date/Time   COLORURINE YELLOW 12/04/2020 1700   APPEARANCEUR CLEAR 12/04/2020 1700   APPEARANCEUR Clear 07/13/2020 0000   LABSPEC 1.013 12/04/2020 1700   PHURINE 5.0 12/04/2020 1700   GLUCOSEU NEGATIVE 12/04/2020 1700   HGBUR NEGATIVE 12/04/2020 1700   BILIRUBINUR NEGATIVE 12/04/2020 1700   BILIRUBINUR negative 11/29/2020 1313   BILIRUBINUR Negative 07/13/2020 0000   KETONESUR NEGATIVE 12/04/2020 1700   PROTEINUR NEGATIVE 12/04/2020 1700   UROBILINOGEN 0.2 11/29/2020 1313   UROBILINOGEN 1.0 01/31/2013 1035   NITRITE NEGATIVE 12/04/2020 1700   LEUKOCYTESUR NEGATIVE 12/04/2020 1700   Sepsis Labs:  Recent Results (from the past 240 hour(s))  Culture, Urine     Status: None   Collection Time: 12/04/20  5:00 PM   Specimen: Urine, Random  Result Value Ref Range Status   Specimen Description   Final    URINE, RANDOM Performed at [facility], [address], [place], [place] [address]  Special Requests   Final    NONE Performed at [facility], [address], [place], [place] [address]    Culture   Final    NO GROWTH Performed at [facility], [place] [address] [place], [place] [address]    Report Status 12/06/2020 FINAL  Final     Scheduled Meds: . aerochamber Z-Stat Plus/medium  1 each Other Once  . vitamin C  500 mg Oral Daily  . atorvastatin  80 mg Oral Q M,W,F  . cholecalciferol  2,000 Units Oral Daily  . dextromethorphan-guaiFENesin  1 tablet Oral BID  . finasteride  5 mg Oral Daily  . furosemide  20 mg Oral Daily  . metoprolol tartrate  37.5 mg Oral BID  .  mirabegron ER  50 mg Oral Daily  . multivitamin with minerals  1 tablet Oral Daily  . predniSONE  40 mg Oral Q breakfast  . sertraline  100 mg Oral Daily  . tamsulosin  0.4 mg Oral QPC supper  . warfarin  1.5 mg Oral ONCE-1600  . Warfarin - Pharmacist Dosing Inpatient   Does not apply q1600  . zinc sulfate  220 mg Oral Daily   Continuous Infusions:   Procedures/Studies: DG Chest 2 View  Result Date: 12/02/2020 CLINICAL DATA:  Cough and shortness of breath for 3 days EXAM: CHEST - 2 VIEW COMPARISON:  07/20/2020 FINDINGS: Cardiac shadow is enlarged but stable. Defibrillator is again noted and stable. Bilateral scarring is seen with interstitial prominence stable from the prior study. No new focal infiltrate is seen. No bony abnormality is noted. IMPRESSION: Chronic scarring bilaterally.  No acute abnormality seen. Electronically Signed   By: Inez Catalina M.D.   On: 12/02/2020 10:02   DG Chest Port 1 View  Result Date: 12/03/2020 CLINICAL DATA:  Shortness of breath and cough. EXAM: PORTABLE CHEST 1 VIEW COMPARISON:  December 02, 2020 FINDINGS: There is an unchanged appearance of the right-sided ICD. The heart size is enlarged. The lung volumes are low. Linear airspace opacities are again noted bilaterally. There is no pneumothorax. There is no large pleural effusion. No acute osseous abnormality. There are multiple old left-sided rib fractures. IMPRESSION: No active disease. Electronically Signed   By: Constance Holster M.D.   On: 12/03/2020 02:23   CUP PACEART INCLINIC DEVICE CHECK  Result Date: 11/21/2020 ICD check in clinic. Normal device function. Thresholds and sensing consistent with previous device measurements. Impedance trends stable over time.  2 NSVT episode, seen on previous remotes, < 20 beats. Histogram distribution appropriate for patient and  level of activity. No changes made this session. Device programmed at appropriate safety margins. Device programmed to optimize intrinsic  conduction. Estimated longevity 3 years. Pt enrolled in remote follow-up 12/31/20. Patient education completed. ROV w/ SK.Lavenia Atlas, BSN, RN   Roxan Hockey, MD [facility]   12/14/2020, 3:37 PM   LOS: 11 days

## 2020-12-14 NOTE — TOC Progression Note (Signed)
Transition of Care [facility]) - Progression Note    Patient Details  Name: Travis Cruz MRN: [id number] Date of Birth: 1946/09/05  [facility] CM/SW Contact  Natasha Bence, LCSW Phone Number: 12/14/2020, 2:40 PM  Clinical Narrative:    CSW was able to speak with Estill Bamberg from [facility] admissions. Estill Bamberg reported that [name] was started on 12/13/2020 due to a delay with [employer] comm. Estill Bamberg also stated that [place] had not been received yet but if it was not received by the end of 12/14/2020, the auth would most likely not be obtained until Monday 12/17/2020. In the event that auth was received over the weekend, Estill Bamberg reported that they would be able to take a weekend admission. TOC to follow.   Expected Discharge Plan: [place] Barriers to Discharge: Continued Medical Work up  Expected Discharge Plan and Services Expected Discharge Plan: [place] arrangements for the past 2 months: Single Family Home                                       Social Determinants of Health (SDOH) Interventions    Readmission Risk Interventions Readmission Risk Prevention Plan 12/13/2020  Transportation Screening Complete  Medication Review Press photographer) Complete  PCP or Specialist appointment within 3-5 days of discharge Not Complete  PCP/Specialist Appt Not Complete comments Patient will see SNF medical director  [place] or [place] Complete  SW Recovery Care/Counseling Consult Complete  Palliative Care Screening Not [place] Complete  Some recent data might be hidden

## 2020-12-15 LAB — BASIC METABOLIC PANEL
Anion gap: 7 (ref 5–15)
BUN: 32 mg/dL — ABNORMAL HIGH (ref 8–23)
CO2: 33 mmol/L — ABNORMAL HIGH (ref 22–32)
Calcium: 9 mg/dL (ref 8.9–10.3)
Chloride: 98 mmol/L (ref 98–111)
Creatinine, Ser: 0.82 mg/dL (ref 0.61–1.24)
GFR, Estimated: >60 mL/min (ref >60)
Glucose, Bld: 90 mg/dL (ref 70–99)
Potassium: 4.4 mmol/L (ref 3.5–5.1)
Sodium: 138 mmol/L (ref 135–145)

## 2020-12-15 LAB — PROTIME-INR
INR: 1.4 — ABNORMAL HIGH (ref 0.8–1.2)
Prothrombin Time: 17 seconds — ABNORMAL HIGH (ref 11.4–15.2)

## 2020-12-15 LAB — CBC
HCT: 54 % — ABNORMAL HIGH (ref 39.0–52.0)
Hemoglobin: 17.1 g/dL — ABNORMAL HIGH (ref 13.0–17.0)
MCH: 28.7 pg (ref 26.0–34.0)
MCHC: 31.7 g/dL (ref 30.0–36.0)
MCV: 90.6 fL (ref 80.0–100.0)
Platelets: 437 10*3/uL — ABNORMAL HIGH (ref 150–400)
RBC: 5.96 MIL/uL — ABNORMAL HIGH (ref 4.22–5.81)
RDW: 17.5 % — ABNORMAL HIGH (ref 11.5–15.5)
WBC: 19.6 10*3/uL — ABNORMAL HIGH (ref 4.0–10.5)
nRBC: 0 % (ref 0.0–0.2)

## 2020-12-15 MED ORDER — WARFARIN SODIUM 1 MG PO TABS
3.0000 mg | ORAL_TABLET | Freq: Once | ORAL | Status: AC
  Administered 2020-12-15: 3 mg via ORAL
  Filled 2020-12-15: qty 3

## 2020-12-15 NOTE — Progress Notes (Signed)
PROGRESS NOTE  Travis Cruz VHQ:[id number] DOB: July 02, 1946 DOA: 12/03/2020 PCP: Dettinger, Fransisca Kaufmann, MD  Brief History:  74 year old male with a history of dementia, systolic and diastolic CHF, DVT/PE on warfarin, COPD, V. fib arrest status post AICD, hypertension, ischemic cardiomyopathy, lung cancer, chronic respiratory failure on 3 L, and stroke presenting with 5 to 6-day history of increasing confusion, low-grade fever and progressive dyspnea on exertion.  The patient is unable to provide any history.  All of history is obtained from review of the medical record and speaking with patient's spouse.  The patient has had increasing somnolence and decreasing oral intake.  The patient spouse took him to urgent care on 11/29/20 at which time a UA was unremarkable.  Home Covid test on 12/01/2020 was negative.  However, the patient continued to have low-grade fevers up to 100.9 F at home.  In addition, the patient's spouse also noted his oxygen saturation running in the low 90s, 90-92% on his usual 3 L at home. In the emergency department, the patient was febrile up to 1 to 2.4 F with tachycardia.  Oxygen saturation was 94-96% on 4 L.  WBC 15.2, hemoglobin 15.1, platelets 349,000. -Patient remains medically stable for discharge --Awaiting insurance authorization for transfer to SNF rehab  Assessment/Plan: Acute on chronic respiratory failure with hypoxia--secondary to COVID-19 pneumonia -Currently back to his chronic 3 L nasal cannula supplementation with good saturation --Patient has completed 5 days of remdesivir infusion on 12/07/2020 continue a slow steroids tapering. -Continue vitamin C and zinc. -PCT 0.11-->NEG; antibiotic has been discontinued. -Continue supportive care, incentive respirometer, flutter valve and as needed antitussive medication.  Acute metabolic encephalopathy -Secondary to COVID-19 infection -12/2 UA--no pyuria -overall improved, mental status appears back  to baseline -Continue constant reorientation.  Systolic and diastolic CHF-chronic -Appears clinically compensated -03/03/2019 echo EF >60 percent -Continue to follow Daily weights -Continue furosemide -Follow strict I's and O's  COPD exacerbation -No acute exacerbation  -speaking in full sentences currently. -Continue steroids and bronchodilator therapy. -Overall improved  Dementia without behavior disturbance -at high risk for hospital delirium -Continue constant reorientation and supportive care.    Coronary artery disease -No chest pain presently - EKG--sinus rhythm, no ST-T wave change  Hyperlipidemia -Continue statin -Heart healthy diet has been encouraged.  History of DVT and PE -Coumadin as per pharmacy   -On 12/15/2020 sub-therapeutic INR noted  Essential hypertension -Continue metoprolol tartrate -Stable blood pressure.  Depression/anxiety -Continue Zoloft -Continue constant reorientation -Mood stable currently.  History of V. fib arrest January 2014/ischemic cardiomyopathy -Status post AICD -Continue telemetry monitoring -Patient denies palpitations.  Physical Deconditioning -PT eval>>SNF -Family in agreement -Patient has been clinically stable for discharge since 12/07/2020; at this time awaiting insurance authorization. .  Status is: Inpatient    Dispo: The patient is from: Home              Anticipated d/c is to: Home              Anticipated d/c date is: 1 day (waiting on insurance authorization/nursing home availability).              Patient currently medically stable for discharge to skilled nursing facility.  --Patient remains medically stable for discharge --Awaiting insurance authorization for transfer to SNF rehab  Family Communication:   I called and updated his wife by phone-- Ms [name] [phone]  Consultants:  none  Code Status:  FULL  DVT Prophylaxis:  warfarin   Procedures: As Listed in Progress Note  Above  Antibiotics: Ceftriaxone 12/6>>12/7 azithro 12/6>.12/7   Subjective:  -Patient remains medically stable for discharge --Awaiting insurance authorization for transfer to SNF rehab I called and updated his wife by phone-- Ms [name] [phone] -No new complaints   Objective: Vitals:   12/14/20 1358 12/14/20 2141 12/15/20 0554 12/15/20 1442  BP: 116/74 125/82 131/83 128/83  Pulse: 72 77 75 75  Resp: 20 20 17 19   Temp: 97.8 F (36.6 C) 98.1 F (36.7 C) 97.8 F (36.6 C) 98.6 F (37 C)  TempSrc: Oral Oral Oral Oral  SpO2: 95% 94% 94% 93%  Weight:      Height:        Intake/Output Summary (Last 24 hours) at 12/15/2020 1611 Last data filed at 12/15/2020 1500 Gross per 24 hour  Intake 480 ml  Output --  Net 480 ml   Weight change:   Exam: General exam: Afebrile, no acute complaints;    Nose-[place] 3L/min  Respiratory system: Fair air movement, no wheezing  cardiovascular system:RRR. No murmurs, rubs, gallops.  No JVD on exam. Gastrointestinal system: Abdomen is nondistended, soft and nontender.   Normal bowel sounds heard. Central nervous system: No focal neurological deficits. Extremities: No cyanosis or clubbing. Skin: No petechiae. Psychiatry: Some baseline memory and cognitive deficits, occasional episodes of disorientation, no agitation.    Data Reviewed: I have personally reviewed following labs and imaging studies  Basic Metabolic Panel: Recent Labs  Lab 12/10/20 0536 12/15/20 0632  NA 141 138  K 4.2 4.4  CL 96* 98  CO2 36* 33*  GLUCOSE 110* 90  BUN 39* 32*  CREATININE 1.06 0.82  CALCIUM 8.9 9.0   Liver Function Tests: No results for input(s): AST, ALT, ALKPHOS, BILITOT, PROT, ALBUMIN in the last 168 hours. Coagulation Profile: Recent Labs  Lab 12/11/20 0736 12/12/20 0423 12/13/20 0547 12/14/20 0615 12/15/20 0632  INR 3.4* 3.0* 2.3* 1.9* 1.4*   CBC: Recent Labs  Lab 12/10/20 0536 12/15/20 0632  WBC 22.2* 19.6*  HGB 18.4*  17.1*  HCT 58.5* 54.0*  MCV 91.1 90.6  PLT 493* 437*   Urine analysis:    Component Value Date/Time   COLORURINE YELLOW 12/04/2020 1700   APPEARANCEUR CLEAR 12/04/2020 1700   APPEARANCEUR Clear 07/13/2020 0000   LABSPEC 1.013 12/04/2020 1700   PHURINE 5.0 12/04/2020 1700   GLUCOSEU NEGATIVE 12/04/2020 1700   HGBUR NEGATIVE 12/04/2020 1700   BILIRUBINUR NEGATIVE 12/04/2020 1700   BILIRUBINUR negative 11/29/2020 1313   BILIRUBINUR Negative 07/13/2020 0000   KETONESUR NEGATIVE 12/04/2020 1700   PROTEINUR NEGATIVE 12/04/2020 1700   UROBILINOGEN 0.2 11/29/2020 1313   UROBILINOGEN 1.0 01/31/2013 1035   NITRITE NEGATIVE 12/04/2020 1700   LEUKOCYTESUR NEGATIVE 12/04/2020 1700   Sepsis Labs:  No results found for this or any previous visit (from the past 240 hour(s)).   Scheduled Meds: . aerochamber Z-Stat Plus/medium  1 each Other Once  . vitamin C  500 mg Oral Daily  . atorvastatin  80 mg Oral Q M,W,F  . cholecalciferol  2,000 Units Oral Daily  . dextromethorphan-guaiFENesin  1 tablet Oral BID  . finasteride  5 mg Oral Daily  . furosemide  20 mg Oral Daily  . metoprolol tartrate  37.5 mg Oral BID  . mirabegron ER  50 mg Oral Daily  . multivitamin with minerals  1 tablet Oral Daily  . predniSONE  30 mg Oral Q breakfast  . sertraline  100 mg Oral  Daily  . tamsulosin  0.4 mg Oral QPC supper  . warfarin  3 mg Oral ONCE-1600  . Warfarin - Pharmacist Dosing Inpatient   Does not apply q1600  . zinc sulfate  220 mg Oral Daily   Continuous Infusions:   Procedures/Studies: DG Chest 2 View  Result Date: 12/02/2020 CLINICAL DATA:  Cough and shortness of breath for 3 days EXAM: CHEST - 2 VIEW COMPARISON:  07/20/2020 FINDINGS: Cardiac shadow is enlarged but stable. Defibrillator is again noted and stable. Bilateral scarring is seen with interstitial prominence stable from the prior study. No new focal infiltrate is seen. No bony abnormality is noted. IMPRESSION: Chronic scarring  bilaterally.  No acute abnormality seen. Electronically Signed   By: Inez Catalina M.D.   On: 12/02/2020 10:02   DG Chest Port 1 View  Result Date: 12/03/2020 CLINICAL DATA:  Shortness of breath and cough. EXAM: PORTABLE CHEST 1 VIEW COMPARISON:  December 02, 2020 FINDINGS: There is an unchanged appearance of the right-sided ICD. The heart size is enlarged. The lung volumes are low. Linear airspace opacities are again noted bilaterally. There is no pneumothorax. There is no large pleural effusion. No acute osseous abnormality. There are multiple old left-sided rib fractures. IMPRESSION: No active disease. Electronically Signed   By: Constance Holster M.D.   On: 12/03/2020 02:23   CUP PACEART INCLINIC DEVICE CHECK  Result Date: 11/21/2020 ICD check in clinic. Normal device function. Thresholds and sensing consistent with previous device measurements. Impedance trends stable over time.  2 NSVT episode, seen on previous remotes, < 20 beats. Histogram distribution appropriate for patient and  level of activity. No changes made this session. Device programmed at appropriate safety margins. Device programmed to optimize intrinsic conduction. Estimated longevity 3 years. Pt enrolled in remote follow-up 12/31/20. Patient education completed. ROV w/ SK.Lavenia Atlas, BSN, RN   Roxan Hockey, MD [facility]   12/15/2020, 4:11 PM   LOS: 12 days
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
## 2020-12-16 DIAGNOSIS — F339 Major depressive disorder, recurrent, unspecified: Secondary | ICD-10-CM | POA: Diagnosis not present

## 2020-12-16 DIAGNOSIS — M6281 Muscle weakness (generalized): Secondary | ICD-10-CM | POA: Diagnosis not present

## 2020-12-16 DIAGNOSIS — U071 COVID-19: Secondary | ICD-10-CM | POA: Diagnosis not present

## 2020-12-16 DIAGNOSIS — M1990 Unspecified osteoarthritis, unspecified site: Secondary | ICD-10-CM | POA: Diagnosis not present

## 2020-12-16 DIAGNOSIS — J449 Chronic obstructive pulmonary disease, unspecified: Secondary | ICD-10-CM | POA: Diagnosis not present

## 2020-12-16 DIAGNOSIS — F419 Anxiety disorder, unspecified: Secondary | ICD-10-CM | POA: Diagnosis not present

## 2020-12-16 DIAGNOSIS — J984 Other disorders of lung: Secondary | ICD-10-CM | POA: Diagnosis not present

## 2020-12-16 DIAGNOSIS — I110 Hypertensive heart disease with heart failure: Secondary | ICD-10-CM | POA: Diagnosis not present

## 2020-12-16 DIAGNOSIS — J9621 Acute and chronic respiratory failure with hypoxia: Secondary | ICD-10-CM | POA: Diagnosis not present

## 2020-12-16 DIAGNOSIS — I5042 Chronic combined systolic (congestive) and diastolic (congestive) heart failure: Secondary | ICD-10-CM | POA: Diagnosis not present

## 2020-12-16 DIAGNOSIS — I428 Other cardiomyopathies: Secondary | ICD-10-CM | POA: Diagnosis not present

## 2020-12-16 DIAGNOSIS — J441 Chronic obstructive pulmonary disease with (acute) exacerbation: Secondary | ICD-10-CM | POA: Diagnosis not present

## 2020-12-16 DIAGNOSIS — E785 Hyperlipidemia, unspecified: Secondary | ICD-10-CM | POA: Diagnosis not present

## 2020-12-16 DIAGNOSIS — R262 Difficulty in walking, not elsewhere classified: Secondary | ICD-10-CM | POA: Diagnosis not present

## 2020-12-16 DIAGNOSIS — C3490 Malignant neoplasm of unspecified part of unspecified bronchus or lung: Secondary | ICD-10-CM | POA: Diagnosis not present

## 2020-12-16 DIAGNOSIS — R2681 Unsteadiness on feet: Secondary | ICD-10-CM | POA: Diagnosis not present

## 2020-12-16 DIAGNOSIS — I255 Ischemic cardiomyopathy: Secondary | ICD-10-CM | POA: Diagnosis not present

## 2020-12-16 DIAGNOSIS — Z95810 Presence of automatic (implantable) cardiac defibrillator: Secondary | ICD-10-CM | POA: Diagnosis not present

## 2020-12-16 DIAGNOSIS — F0390 Unspecified dementia without behavioral disturbance: Secondary | ICD-10-CM | POA: Diagnosis not present

## 2020-12-16 LAB — PROTIME-INR
INR: 1.8 — ABNORMAL HIGH (ref 0.8–1.2)
Prothrombin Time: 19.8 seconds — ABNORMAL HIGH (ref 11.4–15.2)

## 2020-12-16 MED ORDER — WARFARIN SODIUM 1 MG PO TABS
1.5000 mg | ORAL_TABLET | Freq: Once | ORAL | Status: DC
  Filled 2020-12-16: qty 0.5

## 2020-12-16 MED ORDER — ATORVASTATIN CALCIUM 40 MG PO TABS: 40.0000 mg | ORAL_TABLET | ORAL | 2 refills | Status: DC

## 2020-12-16 MED ORDER — ASCORBIC ACID 500 MG PO TABS: 500.0000 mg | ORAL_TABLET | Freq: Every day | ORAL | 0 refills | Status: AC

## 2020-12-16 MED ORDER — ZINC SULFATE 220 (50 ZN) MG PO CAPS: 220.0000 mg | ORAL_CAPSULE | Freq: Every day | ORAL | 0 refills | Status: DC

## 2020-12-16 MED ORDER — WARFARIN SODIUM 2 MG PO TABS: 2.0000 mg | ORAL_TABLET | Freq: Every evening | ORAL | 2 refills | Status: DC

## 2020-12-16 MED ORDER — DM-GUAIFENESIN ER 30-600 MG PO TB12: 1.0000 | ORAL_TABLET | Freq: Two times a day (BID) | ORAL | 0 refills | Status: DC

## 2020-12-16 MED ORDER — ONDANSETRON HCL 4 MG PO TABS: 4.0000 mg | ORAL_TABLET | Freq: Four times a day (QID) | ORAL | 0 refills | Status: DC | PRN

## 2020-12-16 MED ORDER — FUROSEMIDE 20 MG PO TABS: 20.0000 mg | ORAL_TABLET | Freq: Every day | ORAL | 2 refills | Status: DC

## 2020-12-16 MED ORDER — PANTOPRAZOLE SODIUM 40 MG PO TBEC: 40.0000 mg | DELAYED_RELEASE_TABLET | Freq: Every day | ORAL | 1 refills | Status: DC

## 2020-12-16 MED ORDER — METOPROLOL TARTRATE 25 MG PO TABS: 37.5000 mg | ORAL_TABLET | Freq: Two times a day (BID) | ORAL | 3 refills | Status: DC

## 2020-12-16 MED ORDER — PREDNISONE 20 MG PO TABS
20.0000 mg | ORAL_TABLET | Freq: Every day | ORAL | 0 refills | Status: DC
Start: 2020-12-16 — End: 2021-02-13

## 2020-12-16 NOTE — Progress Notes (Signed)
PROGRESS NOTE  Travis Cruz OXB:[id number] DOB: Oct 14, 1946 DOA: 12/03/2020 PCP: Dettinger, Fransisca Kaufmann, MD  Brief History:  74 year old male with a history of dementia, systolic and diastolic CHF, DVT/PE on warfarin, COPD, V. fib arrest status post AICD, hypertension, ischemic cardiomyopathy, lung cancer, chronic respiratory failure on 3 L, and stroke presenting with 5 to 6-day history of increasing confusion, low-grade fever and progressive dyspnea on exertion.  The patient is unable to provide any history.  All of history is obtained from review of the medical record and speaking with patient's spouse.  The patient has had increasing somnolence and decreasing oral intake.  The patient spouse took him to urgent care on 11/29/20 at which time a UA was unremarkable.  Home Covid test on 12/01/2020 was negative.  However, the patient continued to have low-grade fevers up to 100.9 F at home.  In addition, the patient's spouse also noted his oxygen saturation running in the low 90s, 90-92% on his usual 3 L at home. In the emergency department, the patient was febrile up to 1 to 2.4 F with tachycardia.  Oxygen saturation was 94-96% on 4 L.  WBC 15.2, hemoglobin 15.1, platelets 349,000. - -Patient remains medically stable for discharge --Awaiting insurance authorization for transfer to SNF rehab  Assessment/Plan: Acute on chronic respiratory failure with hypoxia--secondary to COVID-19 pneumonia -Tested positive for Covid on 12/02/2020  -Currently back to his chronic 3 L nasal cannula supplementation with good saturation --Patient has completed 5 days of remdesivir infusion on 12/07/2020 continue a slow steroids tapering. -Continue vitamin C and zinc. -PCT 0.11-->NEG; antibiotic has been discontinued. -Continue supportive care, incentive respirometer, flutter valve and as needed antitussive medication.  Acute metabolic encephalopathy -Secondary to COVID-19 infection -12/2 UA--no  pyuria -overall improved, mental status appears back to baseline -Continue constant reorientation.  Systolic and diastolic CHF-chronic -Appears clinically compensated -03/03/2019 echo EF >60 percent -Continue to follow Daily weights -Continue furosemide -Follow strict I's and O's  COPD exacerbation -No acute exacerbation  -speaking in full sentences currently. -Continue steroids and bronchodilator therapy. -Overall improved  Dementia without behavior disturbance --Cooperative, -Continue  reorientation and supportive care.    Coronary artery disease -No chest pain presently - EKG--sinus rhythm, no ST-T wave change  Hyperlipidemia -Continue statin -Heart healthy diet has been encouraged.  History of DVT and PE -Coumadin as per pharmacy   -On 12/16/2020 sub-therapeutic INR noted  Essential Hypertension -Continue metoprolol tartrate -Stable blood pressure.  Depression/anxiety -Continue Zoloft -Continue constant reorientation -Mood stable currently.  History of V. fib arrest January 2014/ischemic cardiomyopathy -Status post AICD -Continue telemetry monitoring -Patient denies palpitations.  Physical Deconditioning -PT eval>>SNF -Family in agreement -Patient has been clinically stable for discharge since 12/07/2020; at this time awaiting insurance authorization. .  Status is: Inpatient    Dispo: The patient is from: Home              Anticipated d/c is to: Home              Anticipated d/c date is: 1 day (Awaiting on insurance authorization for SNF).              Patient currently medically stable for discharge to skilled nursing facility.  --Patient remains medically stable for discharge --Awaiting insurance authorization for transfer to SNF rehab  Family Communication:   I called and updated his wife by phone-- Ms [name] [phone]  Consultants:  none  Code Status:  FULL  DVT Prophylaxis:  warfarin   Procedures: As Listed in Progress  Note Above  Antibiotics: Ceftriaxone 12/6>>12/7 azithro 12/6>.12/7   Subjective:  -Patient remains medically stable for discharge --Awaiting insurance authorization for transfer to SNF rehab I called and updated his wife by phone-- Ms [name] [phone] -No new complaints -Oral intake is inconsistent -Needs help at times with feeding   Objective: Vitals:   12/15/20 0554 12/15/20 1442 12/15/20 2111 12/16/20 0604  BP: 131/83 128/83 (!) 142/95 117/83  Pulse: 75 75 87 85  Resp: 17 19 18 19   Temp: 97.8 F (36.6 C) 98.6 F (37 C) 98.1 F (36.7 C) 97.6 F (36.4 C)  TempSrc: Oral Oral    SpO2: 94% 93% 92% 95%  Weight:      Height:        Intake/Output Summary (Last 24 hours) at 12/16/2020 1123 Last data filed at 12/15/2020 1700 Gross per 24 hour  Intake 240 ml  Output --  Net 240 ml   Weight change:   Exam: General exam: Afebrile, no acute complaints;    Nose-[place] 3L/min  Respiratory system: Fair air movement, no wheezing  cardiovascular system:RRR. No murmurs, rubs, gallops.  No JVD on exam. Gastrointestinal system: Abdomen is nondistended, soft and nontender.   Normal bowel sounds heard. Central nervous system: No focal neurological deficits. Extremities: No cyanosis or clubbing. Skin: No petechiae. Psychiatry: Some baseline memory and cognitive deficits, occasional episodes of disorientation, no agitation.    Data Reviewed: I have personally reviewed following labs and imaging studies  Basic Metabolic Panel: Recent Labs  Lab 12/10/20 0536 12/15/20 0632  NA 141 138  K 4.2 4.4  CL 96* 98  CO2 36* 33*  GLUCOSE 110* 90  BUN 39* 32*  CREATININE 1.06 0.82  CALCIUM 8.9 9.0   Liver Function Tests: No results for input(s): AST, ALT, ALKPHOS, BILITOT, PROT, ALBUMIN in the last 168 hours. Coagulation Profile: Recent Labs  Lab 12/12/20 0423 12/13/20 0547 12/14/20 0615 12/15/20 0632 12/16/20 0655  INR 3.0* 2.3* 1.9* 1.4* 1.8*   CBC: Recent Labs   Lab 12/10/20 0536 12/15/20 0632  WBC 22.2* 19.6*  HGB 18.4* 17.1*  HCT 58.5* 54.0*  MCV 91.1 90.6  PLT 493* 437*   Urine analysis:    Component Value Date/Time   COLORURINE YELLOW 12/04/2020 1700   APPEARANCEUR CLEAR 12/04/2020 1700   APPEARANCEUR Clear 07/13/2020 0000   LABSPEC 1.013 12/04/2020 1700   PHURINE 5.0 12/04/2020 1700   GLUCOSEU NEGATIVE 12/04/2020 1700   HGBUR NEGATIVE 12/04/2020 1700   BILIRUBINUR NEGATIVE 12/04/2020 1700   BILIRUBINUR negative 11/29/2020 1313   BILIRUBINUR Negative 07/13/2020 0000   KETONESUR NEGATIVE 12/04/2020 1700   PROTEINUR NEGATIVE 12/04/2020 1700   UROBILINOGEN 0.2 11/29/2020 1313   UROBILINOGEN 1.0 01/31/2013 1035   NITRITE NEGATIVE 12/04/2020 1700   LEUKOCYTESUR NEGATIVE 12/04/2020 1700   Sepsis Labs:  No results found for this or any previous visit (from the past 240 hour(s)).   Scheduled Meds: . aerochamber Z-Stat Plus/medium  1 each Other Once  . vitamin C  500 mg Oral Daily  . atorvastatin  80 mg Oral Q M,W,F  . cholecalciferol  2,000 Units Oral Daily  . dextromethorphan-guaiFENesin  1 tablet Oral BID  . finasteride  5 mg Oral Daily  . furosemide  20 mg Oral Daily  . metoprolol tartrate  37.5 mg Oral BID  . mirabegron ER  50 mg Oral Daily  . multivitamin with minerals  1 tablet Oral Daily  .  predniSONE  30 mg Oral Q breakfast  . sertraline  100 mg Oral Daily  . tamsulosin  0.4 mg Oral QPC supper  . warfarin  1.5 mg Oral ONCE-1600  . Warfarin - Pharmacist Dosing Inpatient   Does not apply q1600  . zinc sulfate  220 mg Oral Daily   Continuous Infusions:   Procedures/Studies: DG Chest 2 View  Result Date: 12/02/2020 CLINICAL DATA:  Cough and shortness of breath for 3 days EXAM: CHEST - 2 VIEW COMPARISON:  07/20/2020 FINDINGS: Cardiac shadow is enlarged but stable. Defibrillator is again noted and stable. Bilateral scarring is seen with interstitial prominence stable from the prior study. No new focal infiltrate is  seen. No bony abnormality is noted. IMPRESSION: Chronic scarring bilaterally.  No acute abnormality seen. Electronically Signed   By: Inez Catalina M.D.   On: 12/02/2020 10:02   DG Chest Port 1 View  Result Date: 12/03/2020 CLINICAL DATA:  Shortness of breath and cough. EXAM: PORTABLE CHEST 1 VIEW COMPARISON:  December 02, 2020 FINDINGS: There is an unchanged appearance of the right-sided ICD. The heart size is enlarged. The lung volumes are low. Linear airspace opacities are again noted bilaterally. There is no pneumothorax. There is no large pleural effusion. No acute osseous abnormality. There are multiple old left-sided rib fractures. IMPRESSION: No active disease. Electronically Signed   By: Constance Holster M.D.   On: 12/03/2020 02:23   CUP PACEART INCLINIC DEVICE CHECK  Result Date: 11/21/2020 ICD check in clinic. Normal device function. Thresholds and sensing consistent with previous device measurements. Impedance trends stable over time.  2 NSVT episode, seen on previous remotes, < 20 beats. Histogram distribution appropriate for patient and  level of activity. No changes made this session. Device programmed at appropriate safety margins. Device programmed to optimize intrinsic conduction. Estimated longevity 3 years. Pt enrolled in remote follow-up 12/31/20. Patient education completed. ROV w/ SK.Lavenia Atlas, BSN, RN   Roxan Hockey, MD [facility]   12/16/2020, 11:23 AM   LOS: 13 days
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
## 2020-12-16 NOTE — TOC Transition Note (Signed)
Transition of Care [facility]) - CM/SW Discharge Note   Patient Details  Name: Travis Cruz MRN: [id number] Date of Birth: 11-Jun-1946  [facility]) CM/SW Contact:  Natasha Bence, LCSW Phone Number: 12/16/2020, 12:21 PM   Clinical Narrative:    CSW notified of patient's request for a local SNF facility due to patient's Covid window ending on Monday. CSW consulted TOC supervisor Janett Billow who reported that we are not able to hold the bed for the patient for a new SNF authorization due to the amount of time it would require for SNF auth cancellation, new referral and time needed for BCBS comm SNF approval. CSW received call from [place] with [facility] stating that [place] had been approved on 12/16/2020. CSW notified patient's wife and discussed options to seek a closer facility with staff at [facility]. Wife agreeable to [facility] of [facility].  notified [place]. CSW completed med necessity, faxed discharge summary and contacted EMS. TOC signing off.    Final next level of care: Skilled Nursing Facility Barriers to Discharge: Barriers Resolved   Patient Goals and CMS Choice Patient states their goals for this hospitalization and ongoing recovery are:: Rehab with SNF CMS Medicare.gov Compare Post Acute Care list provided to:: Patient Choice offered to / list presented to : Patient  Discharge Placement              Patient chooses bed at: [facility] Patient to be transferred to facility by: [facility] Name of family member notified: Marguerite Olea Patient and family notified of of transfer: 12/16/20  Discharge Plan and Services                DME Arranged: N/A DME Agency: NA         HH Agency: NA        Social Determinants of Health (SDOH) Interventions     Readmission Risk Interventions Readmission Risk Prevention Plan 12/13/2020  Transportation Screening Complete  Medication Review Human resources officer) Complete  PCP or Specialist appointment within 3-5 days of discharge Not Complete  PCP/Specialist Appt Not Complete comments Patient will see SNF medical director  [place] or [place] Complete  SW Recovery Care/Counseling Consult Complete  Palliative Care Screening Not [place] Complete  Some recent data might be hidden

## 2020-12-16 NOTE — Discharge Instructions (Signed)
1)Avoid ibuprofen/Advil/Aleve/Motrin/Goody Powders/Naproxen/BC powders/Meloxicam/Diclofenac/Indomethacin and other Nonsteroidal anti-inflammatory medications as these will make you more likely to bleed and can cause stomach ulcers, can also cause Kidney problems.   2)Repeat PT/INR on Tuesday 12/18/20  3)Repeat CBC and BMP test on Thursday, 12/20/2020

## 2020-12-16 NOTE — Progress Notes (Signed)
Report called and given to Alcus Dad at [facility]. All questions were answered and no further questions at this time. Patient in stable condition and in no distress at this time. Patient's wife has been updated on transfer to [place]. Patient will be transported via EMS.

## 2020-12-17 DIAGNOSIS — J449 Chronic obstructive pulmonary disease, unspecified: Secondary | ICD-10-CM | POA: Diagnosis not present

## 2020-12-17 DIAGNOSIS — C3490 Malignant neoplasm of unspecified part of unspecified bronchus or lung: Secondary | ICD-10-CM | POA: Diagnosis not present

## 2020-12-17 DIAGNOSIS — U071 COVID-19: Secondary | ICD-10-CM | POA: Diagnosis not present

## 2020-12-19 ENCOUNTER — Ambulatory Visit: Payer: BC Managed Care – PPO

## 2020-12-31 ENCOUNTER — Ambulatory Visit (INDEPENDENT_AMBULATORY_CARE_PROVIDER_SITE_OTHER): Payer: BC Managed Care – PPO

## 2020-12-31 DIAGNOSIS — I428 Other cardiomyopathies: Secondary | ICD-10-CM | POA: Diagnosis not present

## 2021-01-01 DIAGNOSIS — J449 Chronic obstructive pulmonary disease, unspecified: Secondary | ICD-10-CM | POA: Diagnosis not present

## 2021-01-01 DIAGNOSIS — C3490 Malignant neoplasm of unspecified part of unspecified bronchus or lung: Secondary | ICD-10-CM | POA: Diagnosis not present

## 2021-01-01 DIAGNOSIS — U071 COVID-19: Secondary | ICD-10-CM | POA: Diagnosis not present

## 2021-01-01 LAB — CUP PACEART REMOTE DEVICE CHECK
Battery Remaining Longevity: 34 mo
Battery Remaining Percentage: 32 %
Battery Voltage: 2.84 V
Brady Statistic RV Percent Paced: 1 %
Date Time Interrogation Session: 20220103150609
HighPow Impedance: 100 Ohm
HighPow Impedance: 100 Ohm
Implantable Lead Implant Date: 20140205
Implantable Lead Location: 753860
Implantable Lead Model: 181
Implantable Lead Serial Number: 323859
Implantable Pulse Generator Implant Date: 20140205
Lead Channel Impedance Value: 450 Ohm
Lead Channel Pacing Threshold Amplitude: 1 V
Lead Channel Pacing Threshold Pulse Width: 0.5 ms
Lead Channel Sensing Intrinsic Amplitude: 11.8 mV
Lead Channel Setting Pacing Amplitude: 2.5 V
Lead Channel Setting Pacing Pulse Width: 0.5 ms
Lead Channel Setting Sensing Sensitivity: 0.5 mV
Pulse Gen Serial Number: 7040910

## 2021-01-15 NOTE — Progress Notes (Signed)
Remote ICD transmission.   

## 2021-01-17 ENCOUNTER — Telehealth: Payer: Self-pay

## 2021-01-17 ENCOUNTER — Encounter: Payer: Self-pay | Admitting: Family Medicine

## 2021-01-18 NOTE — Telephone Encounter (Signed)
Appointment has already been scheduled and patient's wife is aware.

## 2021-01-19 DIAGNOSIS — U071 COVID-19: Secondary | ICD-10-CM | POA: Diagnosis not present

## 2021-01-19 DIAGNOSIS — Z86718 Personal history of other venous thrombosis and embolism: Secondary | ICD-10-CM | POA: Diagnosis not present

## 2021-01-19 DIAGNOSIS — J449 Chronic obstructive pulmonary disease, unspecified: Secondary | ICD-10-CM | POA: Diagnosis not present

## 2021-01-19 DIAGNOSIS — I252 Old myocardial infarction: Secondary | ICD-10-CM | POA: Diagnosis not present

## 2021-01-19 DIAGNOSIS — I255 Ischemic cardiomyopathy: Secondary | ICD-10-CM | POA: Diagnosis not present

## 2021-01-19 DIAGNOSIS — E119 Type 2 diabetes mellitus without complications: Secondary | ICD-10-CM | POA: Diagnosis not present

## 2021-01-19 DIAGNOSIS — I2510 Atherosclerotic heart disease of native coronary artery without angina pectoris: Secondary | ICD-10-CM | POA: Diagnosis not present

## 2021-01-19 DIAGNOSIS — Z8673 Personal history of transient ischemic attack (TIA), and cerebral infarction without residual deficits: Secondary | ICD-10-CM | POA: Diagnosis not present

## 2021-01-19 DIAGNOSIS — I110 Hypertensive heart disease with heart failure: Secondary | ICD-10-CM | POA: Diagnosis not present

## 2021-01-19 DIAGNOSIS — I509 Heart failure, unspecified: Secondary | ICD-10-CM | POA: Diagnosis not present

## 2021-01-19 DIAGNOSIS — J9690 Respiratory failure, unspecified, unspecified whether with hypoxia or hypercapnia: Secondary | ICD-10-CM | POA: Diagnosis not present

## 2021-01-19 DIAGNOSIS — Z85118 Personal history of other malignant neoplasm of bronchus and lung: Secondary | ICD-10-CM | POA: Diagnosis not present

## 2021-01-19 DIAGNOSIS — G4733 Obstructive sleep apnea (adult) (pediatric): Secondary | ICD-10-CM | POA: Diagnosis not present

## 2021-01-21 ENCOUNTER — Encounter: Payer: Self-pay | Admitting: Family Medicine

## 2021-01-21 DIAGNOSIS — G4733 Obstructive sleep apnea (adult) (pediatric): Secondary | ICD-10-CM | POA: Diagnosis not present

## 2021-01-21 DIAGNOSIS — U071 COVID-19: Secondary | ICD-10-CM | POA: Diagnosis not present

## 2021-01-21 DIAGNOSIS — J9690 Respiratory failure, unspecified, unspecified whether with hypoxia or hypercapnia: Secondary | ICD-10-CM | POA: Diagnosis not present

## 2021-01-21 DIAGNOSIS — E119 Type 2 diabetes mellitus without complications: Secondary | ICD-10-CM | POA: Diagnosis not present

## 2021-01-21 DIAGNOSIS — Z86718 Personal history of other venous thrombosis and embolism: Secondary | ICD-10-CM | POA: Diagnosis not present

## 2021-01-21 DIAGNOSIS — I252 Old myocardial infarction: Secondary | ICD-10-CM | POA: Diagnosis not present

## 2021-01-21 DIAGNOSIS — I255 Ischemic cardiomyopathy: Secondary | ICD-10-CM | POA: Diagnosis not present

## 2021-01-21 DIAGNOSIS — I509 Heart failure, unspecified: Secondary | ICD-10-CM | POA: Diagnosis not present

## 2021-01-21 DIAGNOSIS — I2510 Atherosclerotic heart disease of native coronary artery without angina pectoris: Secondary | ICD-10-CM | POA: Diagnosis not present

## 2021-01-21 DIAGNOSIS — J449 Chronic obstructive pulmonary disease, unspecified: Secondary | ICD-10-CM | POA: Diagnosis not present

## 2021-01-21 DIAGNOSIS — Z85118 Personal history of other malignant neoplasm of bronchus and lung: Secondary | ICD-10-CM | POA: Diagnosis not present

## 2021-01-21 DIAGNOSIS — Z8673 Personal history of transient ischemic attack (TIA), and cerebral infarction without residual deficits: Secondary | ICD-10-CM | POA: Diagnosis not present

## 2021-01-21 DIAGNOSIS — I110 Hypertensive heart disease with heart failure: Secondary | ICD-10-CM | POA: Diagnosis not present

## 2021-01-22 DIAGNOSIS — Z85118 Personal history of other malignant neoplasm of bronchus and lung: Secondary | ICD-10-CM | POA: Diagnosis not present

## 2021-01-22 DIAGNOSIS — Z8673 Personal history of transient ischemic attack (TIA), and cerebral infarction without residual deficits: Secondary | ICD-10-CM | POA: Diagnosis not present

## 2021-01-22 DIAGNOSIS — U071 COVID-19: Secondary | ICD-10-CM | POA: Diagnosis not present

## 2021-01-22 DIAGNOSIS — Z86718 Personal history of other venous thrombosis and embolism: Secondary | ICD-10-CM | POA: Diagnosis not present

## 2021-01-22 DIAGNOSIS — J9690 Respiratory failure, unspecified, unspecified whether with hypoxia or hypercapnia: Secondary | ICD-10-CM | POA: Diagnosis not present

## 2021-01-22 DIAGNOSIS — I2510 Atherosclerotic heart disease of native coronary artery without angina pectoris: Secondary | ICD-10-CM | POA: Diagnosis not present

## 2021-01-22 DIAGNOSIS — I252 Old myocardial infarction: Secondary | ICD-10-CM | POA: Diagnosis not present

## 2021-01-22 DIAGNOSIS — I110 Hypertensive heart disease with heart failure: Secondary | ICD-10-CM | POA: Diagnosis not present

## 2021-01-22 DIAGNOSIS — J449 Chronic obstructive pulmonary disease, unspecified: Secondary | ICD-10-CM | POA: Diagnosis not present

## 2021-01-22 DIAGNOSIS — G4733 Obstructive sleep apnea (adult) (pediatric): Secondary | ICD-10-CM | POA: Diagnosis not present

## 2021-01-22 DIAGNOSIS — I255 Ischemic cardiomyopathy: Secondary | ICD-10-CM | POA: Diagnosis not present

## 2021-01-22 DIAGNOSIS — E119 Type 2 diabetes mellitus without complications: Secondary | ICD-10-CM | POA: Diagnosis not present

## 2021-01-22 DIAGNOSIS — I509 Heart failure, unspecified: Secondary | ICD-10-CM | POA: Diagnosis not present

## 2021-01-23 DIAGNOSIS — J449 Chronic obstructive pulmonary disease, unspecified: Secondary | ICD-10-CM | POA: Diagnosis not present

## 2021-01-23 DIAGNOSIS — I509 Heart failure, unspecified: Secondary | ICD-10-CM | POA: Diagnosis not present

## 2021-01-23 DIAGNOSIS — Z8673 Personal history of transient ischemic attack (TIA), and cerebral infarction without residual deficits: Secondary | ICD-10-CM | POA: Diagnosis not present

## 2021-01-23 DIAGNOSIS — U071 COVID-19: Secondary | ICD-10-CM | POA: Diagnosis not present

## 2021-01-23 DIAGNOSIS — E119 Type 2 diabetes mellitus without complications: Secondary | ICD-10-CM | POA: Diagnosis not present

## 2021-01-23 DIAGNOSIS — I252 Old myocardial infarction: Secondary | ICD-10-CM | POA: Diagnosis not present

## 2021-01-23 DIAGNOSIS — I110 Hypertensive heart disease with heart failure: Secondary | ICD-10-CM | POA: Diagnosis not present

## 2021-01-23 DIAGNOSIS — Z85118 Personal history of other malignant neoplasm of bronchus and lung: Secondary | ICD-10-CM | POA: Diagnosis not present

## 2021-01-23 DIAGNOSIS — I255 Ischemic cardiomyopathy: Secondary | ICD-10-CM | POA: Diagnosis not present

## 2021-01-23 DIAGNOSIS — Z86718 Personal history of other venous thrombosis and embolism: Secondary | ICD-10-CM | POA: Diagnosis not present

## 2021-01-23 DIAGNOSIS — G4733 Obstructive sleep apnea (adult) (pediatric): Secondary | ICD-10-CM | POA: Diagnosis not present

## 2021-01-23 DIAGNOSIS — I2510 Atherosclerotic heart disease of native coronary artery without angina pectoris: Secondary | ICD-10-CM | POA: Diagnosis not present

## 2021-01-23 DIAGNOSIS — J9690 Respiratory failure, unspecified, unspecified whether with hypoxia or hypercapnia: Secondary | ICD-10-CM | POA: Diagnosis not present

## 2021-01-24 ENCOUNTER — Telehealth: Payer: Self-pay | Admitting: *Deleted

## 2021-01-24 DIAGNOSIS — Z8673 Personal history of transient ischemic attack (TIA), and cerebral infarction without residual deficits: Secondary | ICD-10-CM | POA: Diagnosis not present

## 2021-01-24 DIAGNOSIS — I2510 Atherosclerotic heart disease of native coronary artery without angina pectoris: Secondary | ICD-10-CM | POA: Diagnosis not present

## 2021-01-24 DIAGNOSIS — I252 Old myocardial infarction: Secondary | ICD-10-CM | POA: Diagnosis not present

## 2021-01-24 DIAGNOSIS — I255 Ischemic cardiomyopathy: Secondary | ICD-10-CM | POA: Diagnosis not present

## 2021-01-24 DIAGNOSIS — Z86718 Personal history of other venous thrombosis and embolism: Secondary | ICD-10-CM

## 2021-01-24 DIAGNOSIS — Z86711 Personal history of pulmonary embolism: Secondary | ICD-10-CM

## 2021-01-24 DIAGNOSIS — E119 Type 2 diabetes mellitus without complications: Secondary | ICD-10-CM | POA: Diagnosis not present

## 2021-01-24 DIAGNOSIS — J9690 Respiratory failure, unspecified, unspecified whether with hypoxia or hypercapnia: Secondary | ICD-10-CM | POA: Diagnosis not present

## 2021-01-24 DIAGNOSIS — G4733 Obstructive sleep apnea (adult) (pediatric): Secondary | ICD-10-CM | POA: Diagnosis not present

## 2021-01-24 DIAGNOSIS — Z85118 Personal history of other malignant neoplasm of bronchus and lung: Secondary | ICD-10-CM | POA: Diagnosis not present

## 2021-01-24 DIAGNOSIS — I110 Hypertensive heart disease with heart failure: Secondary | ICD-10-CM | POA: Diagnosis not present

## 2021-01-24 DIAGNOSIS — J449 Chronic obstructive pulmonary disease, unspecified: Secondary | ICD-10-CM | POA: Diagnosis not present

## 2021-01-24 DIAGNOSIS — U071 COVID-19: Secondary | ICD-10-CM | POA: Diagnosis not present

## 2021-01-24 DIAGNOSIS — I509 Heart failure, unspecified: Secondary | ICD-10-CM | POA: Diagnosis not present

## 2021-01-24 NOTE — Telephone Encounter (Signed)
Fax received mdINR PT/INR self testing service Test date/time 01/23/21 556 pm INR 2.8

## 2021-01-24 NOTE — Telephone Encounter (Signed)
RETURNING NURSE CALL

## 2021-01-24 NOTE — Telephone Encounter (Signed)
Lmtcb.

## 2021-01-24 NOTE — Telephone Encounter (Signed)
Description    continue to take 3 mg every day  INR 2.8 (goal is 2-3),  Recheck in 1 to 2 weeks       Caryl Pina, MD [facility] 01/24/2021, 8:59 AM

## 2021-01-25 DIAGNOSIS — I252 Old myocardial infarction: Secondary | ICD-10-CM | POA: Diagnosis not present

## 2021-01-25 DIAGNOSIS — U071 COVID-19: Secondary | ICD-10-CM | POA: Diagnosis not present

## 2021-01-25 DIAGNOSIS — E119 Type 2 diabetes mellitus without complications: Secondary | ICD-10-CM | POA: Diagnosis not present

## 2021-01-25 DIAGNOSIS — I2510 Atherosclerotic heart disease of native coronary artery without angina pectoris: Secondary | ICD-10-CM | POA: Diagnosis not present

## 2021-01-25 DIAGNOSIS — I509 Heart failure, unspecified: Secondary | ICD-10-CM | POA: Diagnosis not present

## 2021-01-25 DIAGNOSIS — J449 Chronic obstructive pulmonary disease, unspecified: Secondary | ICD-10-CM | POA: Diagnosis not present

## 2021-01-25 DIAGNOSIS — Z85118 Personal history of other malignant neoplasm of bronchus and lung: Secondary | ICD-10-CM | POA: Diagnosis not present

## 2021-01-25 DIAGNOSIS — I255 Ischemic cardiomyopathy: Secondary | ICD-10-CM | POA: Diagnosis not present

## 2021-01-25 DIAGNOSIS — I110 Hypertensive heart disease with heart failure: Secondary | ICD-10-CM | POA: Diagnosis not present

## 2021-01-25 DIAGNOSIS — Z8673 Personal history of transient ischemic attack (TIA), and cerebral infarction without residual deficits: Secondary | ICD-10-CM | POA: Diagnosis not present

## 2021-01-25 DIAGNOSIS — J9690 Respiratory failure, unspecified, unspecified whether with hypoxia or hypercapnia: Secondary | ICD-10-CM | POA: Diagnosis not present

## 2021-01-25 DIAGNOSIS — Z86718 Personal history of other venous thrombosis and embolism: Secondary | ICD-10-CM | POA: Diagnosis not present

## 2021-01-25 DIAGNOSIS — G4733 Obstructive sleep apnea (adult) (pediatric): Secondary | ICD-10-CM | POA: Diagnosis not present

## 2021-01-28 DIAGNOSIS — I110 Hypertensive heart disease with heart failure: Secondary | ICD-10-CM | POA: Diagnosis not present

## 2021-01-28 DIAGNOSIS — I252 Old myocardial infarction: Secondary | ICD-10-CM | POA: Diagnosis not present

## 2021-01-28 DIAGNOSIS — U071 COVID-19: Secondary | ICD-10-CM | POA: Diagnosis not present

## 2021-01-28 DIAGNOSIS — J449 Chronic obstructive pulmonary disease, unspecified: Secondary | ICD-10-CM | POA: Diagnosis not present

## 2021-01-28 DIAGNOSIS — Z86718 Personal history of other venous thrombosis and embolism: Secondary | ICD-10-CM | POA: Diagnosis not present

## 2021-01-28 DIAGNOSIS — J9690 Respiratory failure, unspecified, unspecified whether with hypoxia or hypercapnia: Secondary | ICD-10-CM | POA: Diagnosis not present

## 2021-01-28 DIAGNOSIS — Z85118 Personal history of other malignant neoplasm of bronchus and lung: Secondary | ICD-10-CM | POA: Diagnosis not present

## 2021-01-28 DIAGNOSIS — Z8673 Personal history of transient ischemic attack (TIA), and cerebral infarction without residual deficits: Secondary | ICD-10-CM | POA: Diagnosis not present

## 2021-01-28 DIAGNOSIS — I255 Ischemic cardiomyopathy: Secondary | ICD-10-CM | POA: Diagnosis not present

## 2021-01-28 DIAGNOSIS — E119 Type 2 diabetes mellitus without complications: Secondary | ICD-10-CM | POA: Diagnosis not present

## 2021-01-28 DIAGNOSIS — I2510 Atherosclerotic heart disease of native coronary artery without angina pectoris: Secondary | ICD-10-CM | POA: Diagnosis not present

## 2021-01-28 DIAGNOSIS — G4733 Obstructive sleep apnea (adult) (pediatric): Secondary | ICD-10-CM | POA: Diagnosis not present

## 2021-01-28 DIAGNOSIS — I509 Heart failure, unspecified: Secondary | ICD-10-CM | POA: Diagnosis not present

## 2021-01-30 DIAGNOSIS — I509 Heart failure, unspecified: Secondary | ICD-10-CM | POA: Diagnosis not present

## 2021-01-30 DIAGNOSIS — E119 Type 2 diabetes mellitus without complications: Secondary | ICD-10-CM | POA: Diagnosis not present

## 2021-01-30 DIAGNOSIS — J9690 Respiratory failure, unspecified, unspecified whether with hypoxia or hypercapnia: Secondary | ICD-10-CM | POA: Diagnosis not present

## 2021-01-30 DIAGNOSIS — J449 Chronic obstructive pulmonary disease, unspecified: Secondary | ICD-10-CM | POA: Diagnosis not present

## 2021-01-30 DIAGNOSIS — I110 Hypertensive heart disease with heart failure: Secondary | ICD-10-CM | POA: Diagnosis not present

## 2021-01-30 DIAGNOSIS — Z8673 Personal history of transient ischemic attack (TIA), and cerebral infarction without residual deficits: Secondary | ICD-10-CM | POA: Diagnosis not present

## 2021-01-30 DIAGNOSIS — I255 Ischemic cardiomyopathy: Secondary | ICD-10-CM | POA: Diagnosis not present

## 2021-01-30 DIAGNOSIS — I2510 Atherosclerotic heart disease of native coronary artery without angina pectoris: Secondary | ICD-10-CM | POA: Diagnosis not present

## 2021-01-30 DIAGNOSIS — G4733 Obstructive sleep apnea (adult) (pediatric): Secondary | ICD-10-CM | POA: Diagnosis not present

## 2021-01-30 DIAGNOSIS — Z86718 Personal history of other venous thrombosis and embolism: Secondary | ICD-10-CM | POA: Diagnosis not present

## 2021-01-30 DIAGNOSIS — Z85118 Personal history of other malignant neoplasm of bronchus and lung: Secondary | ICD-10-CM | POA: Diagnosis not present

## 2021-01-30 DIAGNOSIS — U071 COVID-19: Secondary | ICD-10-CM | POA: Diagnosis not present

## 2021-01-30 DIAGNOSIS — I252 Old myocardial infarction: Secondary | ICD-10-CM | POA: Diagnosis not present

## 2021-01-31 DIAGNOSIS — Z8673 Personal history of transient ischemic attack (TIA), and cerebral infarction without residual deficits: Secondary | ICD-10-CM | POA: Diagnosis not present

## 2021-01-31 DIAGNOSIS — U071 COVID-19: Secondary | ICD-10-CM | POA: Diagnosis not present

## 2021-01-31 DIAGNOSIS — I255 Ischemic cardiomyopathy: Secondary | ICD-10-CM | POA: Diagnosis not present

## 2021-01-31 DIAGNOSIS — Z86718 Personal history of other venous thrombosis and embolism: Secondary | ICD-10-CM | POA: Diagnosis not present

## 2021-01-31 DIAGNOSIS — G4733 Obstructive sleep apnea (adult) (pediatric): Secondary | ICD-10-CM | POA: Diagnosis not present

## 2021-01-31 DIAGNOSIS — E119 Type 2 diabetes mellitus without complications: Secondary | ICD-10-CM | POA: Diagnosis not present

## 2021-01-31 DIAGNOSIS — I252 Old myocardial infarction: Secondary | ICD-10-CM | POA: Diagnosis not present

## 2021-01-31 DIAGNOSIS — Z85118 Personal history of other malignant neoplasm of bronchus and lung: Secondary | ICD-10-CM | POA: Diagnosis not present

## 2021-01-31 DIAGNOSIS — I509 Heart failure, unspecified: Secondary | ICD-10-CM | POA: Diagnosis not present

## 2021-01-31 DIAGNOSIS — J449 Chronic obstructive pulmonary disease, unspecified: Secondary | ICD-10-CM | POA: Diagnosis not present

## 2021-01-31 DIAGNOSIS — J9690 Respiratory failure, unspecified, unspecified whether with hypoxia or hypercapnia: Secondary | ICD-10-CM | POA: Diagnosis not present

## 2021-01-31 DIAGNOSIS — I2510 Atherosclerotic heart disease of native coronary artery without angina pectoris: Secondary | ICD-10-CM | POA: Diagnosis not present

## 2021-01-31 DIAGNOSIS — I110 Hypertensive heart disease with heart failure: Secondary | ICD-10-CM | POA: Diagnosis not present

## 2021-02-01 ENCOUNTER — Telehealth: Payer: Self-pay | Admitting: *Deleted

## 2021-02-01 ENCOUNTER — Ambulatory Visit (INDEPENDENT_AMBULATORY_CARE_PROVIDER_SITE_OTHER): Payer: BC Managed Care – PPO | Admitting: Family Medicine

## 2021-02-01 ENCOUNTER — Other Ambulatory Visit: Payer: Self-pay

## 2021-02-01 ENCOUNTER — Encounter: Payer: Self-pay | Admitting: Family Medicine

## 2021-02-01 VITALS — BP 113/72 | HR 83 | Ht 69.0 in | Wt 209.0 lb

## 2021-02-01 DIAGNOSIS — N400 Enlarged prostate without lower urinary tract symptoms: Secondary | ICD-10-CM

## 2021-02-01 DIAGNOSIS — F0391 Unspecified dementia with behavioral disturbance: Secondary | ICD-10-CM | POA: Diagnosis not present

## 2021-02-01 DIAGNOSIS — Z86711 Personal history of pulmonary embolism: Secondary | ICD-10-CM

## 2021-02-01 DIAGNOSIS — J1282 Pneumonia due to coronavirus disease 2019: Secondary | ICD-10-CM | POA: Diagnosis not present

## 2021-02-01 DIAGNOSIS — U071 COVID-19: Secondary | ICD-10-CM | POA: Diagnosis not present

## 2021-02-01 DIAGNOSIS — Z86718 Personal history of other venous thrombosis and embolism: Secondary | ICD-10-CM

## 2021-02-01 MED ORDER — TAMSULOSIN HCL 0.4 MG PO CAPS: 0.4000 mg | ORAL_CAPSULE | Freq: Every day | ORAL | 3 refills | Status: DC

## 2021-02-01 NOTE — Telephone Encounter (Signed)
It was in the other note, I got sent this twice, I also just saw the patient and gave him instructions in person.

## 2021-02-01 NOTE — Telephone Encounter (Signed)
Patient has an appointment with Dr. Keturah Barre today- this encounter will be closed.

## 2021-02-01 NOTE — Telephone Encounter (Signed)
Fax received mdINR PT/INR self testing service Test date/time 01/30/2021 INR 1.9

## 2021-02-01 NOTE — Progress Notes (Signed)
BP 113/72   Pulse 83   Ht '5\' 9"'  (1.753 m)   Wt 209 lb (94.8 kg)   SpO2 92%   BMI 30.86 kg/m    Subjective:   Patient ID: Travis Cruz, male    DOB: Feb 29, 1946, 75 y.o.   MRN: [id number]  HPI: Travis Cruz is a 75 y.o. male presenting on 02/01/2021 for Hospitalization Follow-up (Skilled nursing facility discharged)   HPI Patient was in hospital from 12/03/20 -12/19 and then went to [facility] until 01/17/21. Now he has [facility] PT. His breathing is back to baseline at 3Lnc.  He still gets winded if he walks 30 feet. He was treated for Pneumonia. He had allergy rash cefuroxime. He is on prednisone taper and feels like he is done.  He is struggling a little bit more memory wise and that has been gradually worsening and is especially worse after this hospital stay.  His wife care takes for him a lot.  He has dementia with some behavioral but it does seem like dementia is worsening.  Relevant past medical, surgical, family and social history reviewed and updated as indicated. Interim medical history since our last visit reviewed. Allergies and medications reviewed and updated.  Review of Systems  Constitutional: Negative for chills and fever.  Eyes: Negative for visual disturbance.  Respiratory: Negative for shortness of breath and wheezing.   Cardiovascular: Negative for chest pain and leg swelling.  Musculoskeletal: Negative for back pain and gait problem.  Skin: Negative for rash.  Neurological: Negative for dizziness, weakness and numbness.  Psychiatric/Behavioral: Positive for confusion.  All other systems reviewed and are negative.   Per HPI unless specifically indicated above   Allergies as of 02/01/2021      Reactions   Ativan [lorazepam] Anxiety, Other (See Comments)   Hyper  [name]   Zinacef [cefuroxime] Rash      Medication List       Accurate as of February 01, 2021  4:04 PM. If you have any questions,  ask your nurse or doctor.        STOP taking these medications   benzonatate 100 MG capsule Commonly known as: TESSALON Stopped by: Worthy Rancher, MD   dextromethorphan-guaiFENesin 30-600 MG 12hr tablet Commonly known as: [place] DM Stopped by: Fransisca Kaufmann Rebeckah Masih, MD   ondansetron 4 MG tablet Commonly known as: ZOFRAN Stopped by: Fransisca Kaufmann Leonel Mccollum, MD   pantoprazole 40 MG tablet Commonly known as: Protonix Stopped by: Worthy Rancher, MD   zinc sulfate 220 (50 Zn) MG capsule Stopped by: Fransisca Kaufmann Matisse Salais, MD     TAKE these medications   acetaminophen 325 MG tablet Commonly known as: TYLENOL Take 2 tablets (650 mg total) by mouth every 6 (six) hours as needed for mild pain, fever or headache (or Fever >/= 101).   albuterol 108 (90 Base) MCG/ACT inhaler Commonly known as: VENTOLIN HFA Inhale 2 puffs into the lungs every 4 (four) hours as needed for wheezing or shortness of breath.   ascorbic acid 500 MG tablet Commonly known as: VITAMIN C Take 1 tablet (500 mg total) by mouth daily.   atorvastatin 40 MG tablet Commonly known as: LIPITOR Take 1 tablet (40 mg total) by mouth every Monday, Wednesday, and Friday.   finasteride 5 MG tablet Commonly known as: PROSCAR Take 1 tablet (5 mg total) by mouth daily.   furosemide 20 MG tablet Commonly known as: LASIX Take  1 tablet (20 mg total) by mouth daily.   metoprolol tartrate 25 MG tablet Commonly known as: LOPRESSOR Take 1.5 tablets (37.5 mg total) by mouth 2 (two) times daily.   multivitamin with minerals Tabs tablet Take 1 tablet by mouth daily.   OXYGEN Inhale 3 L/L into the lungs.   predniSONE 20 MG tablet Commonly known as: Deltasone Take 1 tablet (20 mg total) by mouth daily with breakfast.   PRESERVISION AREDS PO Take 1 capsule by mouth 2 (two) times daily.   sertraline 100 MG tablet Commonly known as: ZOLOFT Take 1 tablet (100 mg total) by mouth daily.   tamsulosin 0.4 MG Caps  capsule Commonly known as: FLOMAX Take 1 capsule (0.4 mg total) by mouth daily after supper. (Needs to be seen before next refill)   Trelegy Ellipta 100-62.5-25 MCG/INH Aepb Generic drug: Fluticasone-Umeclidin-Vilant Inhale 1 puff into the lungs daily.   Vitamin D3 50 MCG (2000 UT) Tabs Take 2,000 Units by mouth daily.   warfarin 2 MG tablet Commonly known as: COUMADIN Take as directed by the anticoagulation clinic. If you are unsure how to take this medication, talk to your nurse or doctor. Original instructions: Take 1 tablet (2 mg total) by mouth every evening.        Objective:   BP 113/72   Pulse 83   Ht '5\' 9"'  (1.753 m)   Wt 209 lb (94.8 kg)   SpO2 92%   BMI 30.86 kg/m   Wt Readings from Last 3 Encounters:  02/01/21 209 lb (94.8 kg)  12/03/20 224 lb 13.9 oz (102 kg)  11/21/20 224 lb (101.6 kg)    Physical Exam Vitals and nursing note reviewed.  Constitutional:      General: He is not in acute distress.    Appearance: He is well-developed and well-nourished. He is not diaphoretic.  Eyes:     General: No scleral icterus.    Extraocular Movements: EOM normal.     Conjunctiva/sclera: Conjunctivae normal.  Neck:     Thyroid: No thyromegaly.  Cardiovascular:     Rate and Rhythm: Normal rate and regular rhythm.     Pulses: Intact distal pulses.     Heart sounds: Normal heart sounds. No murmur heard.   Pulmonary:     Effort: Pulmonary effort is normal. No respiratory distress.     Breath sounds: Normal breath sounds. No wheezing.  Musculoskeletal:        General: No edema. Normal range of motion.     Cervical back: Neck supple.  Lymphadenopathy:     Cervical: No cervical adenopathy.  Skin:    General: Skin is warm and dry.     Findings: No rash.  Neurological:     Mental Status: He is alert and oriented to person, place, and time.     Coordination: Coordination normal.  Psychiatric:        Mood and Affect: Mood and affect normal.        Behavior:  Behavior normal.        Cognition and Memory: Cognition is impaired. Memory is impaired. He exhibits impaired recent memory.       Assessment & Plan:   Problem List Items Addressed This Visit      Nervous and Auditory   Dementia with behavioral disturbance ([place]) - Primary   Relevant Orders   CBC with Differential/Platelet   CMP14+EGFR    Other Visit Diagnoses    Benign prostatic hyperplasia, unspecified whether lower urinary tract symptoms present  Relevant Medications   tamsulosin (FLOMAX) 0.4 MG CAPS capsule   Other Relevant Orders   CBC with Differential/Platelet   CMP14+EGFR   Pneumonia due to COVID-19 virus          Will continue current medications, will have discussion about hospice with him family Follow up plan: Return if symptoms worsen or fail to improve, for Recheck dementia.  Counseling provided for all of the vaccine components No orders of the defined types were placed in this encounter.   Caryl Pina, MD [facility] 02/01/2021, 4:04 PM

## 2021-02-01 NOTE — Telephone Encounter (Signed)
What are the instructions for his Coumadin dosing?

## 2021-02-02 LAB — CBC WITH DIFFERENTIAL/PLATELET
Basophils Absolute: 0.1 10*3/uL (ref 0.0–0.2)
Basos: 1 %
EOS (ABSOLUTE): 0 10*3/uL (ref 0.0–0.4)
Eos: 0 %
Hematocrit: 47.7 % (ref 37.5–51.0)
Hemoglobin: 15.9 g/dL (ref 13.0–17.7)
Immature Grans (Abs): 0.3 10*3/uL — ABNORMAL HIGH (ref 0.0–0.1)
Immature Granulocytes: 2 %
Lymphocytes Absolute: 2.4 10*3/uL (ref 0.7–3.1)
Lymphs: 15 %
MCH: 29.1 pg (ref 26.6–33.0)
MCHC: 33.3 g/dL (ref 31.5–35.7)
MCV: 87 fL (ref 79–97)
Monocytes Absolute: 1.6 10*3/uL — ABNORMAL HIGH (ref 0.1–0.9)
Monocytes: 10 %
Neutrophils Absolute: 11.4 10*3/uL — ABNORMAL HIGH (ref 1.4–7.0)
Neutrophils: 72 %
Platelets: 318 10*3/uL (ref 150–450)
RBC: 5.47 x10E6/uL (ref 4.14–5.80)
RDW: 14.6 % (ref 11.6–15.4)
WBC: 15.7 10*3/uL — ABNORMAL HIGH (ref 3.4–10.8)

## 2021-02-02 LAB — CMP14+EGFR
ALT: 20 IU/L (ref 0–44)
AST: 31 IU/L (ref 0–40)
Albumin/Globulin Ratio: 1.6 (ref 1.2–2.2)
Albumin: 3.6 g/dL — ABNORMAL LOW (ref 3.7–4.7)
Alkaline Phosphatase: 111 IU/L (ref 44–121)
BUN/Creatinine Ratio: 26 — ABNORMAL HIGH (ref 10–24)
BUN: 19 mg/dL (ref 8–27)
Bilirubin Total: 0.3 mg/dL (ref 0.0–1.2)
CO2: 24 mmol/L (ref 20–29)
Calcium: 9.6 mg/dL (ref 8.6–10.2)
Chloride: 106 mmol/L (ref 96–106)
Creatinine, Ser: 0.73 mg/dL — ABNORMAL LOW (ref 0.76–1.27)
GFR calc Af Amer: 107 mL/min/{1.73_m2} (ref >59)
GFR calc non Af Amer: 93 mL/min/{1.73_m2} (ref >59)
Globulin, Total: 2.3 g/dL (ref 1.5–4.5)
Glucose: 115 mg/dL — ABNORMAL HIGH (ref 65–99)
Potassium: 4.9 mmol/L (ref 3.5–5.2)
Sodium: 145 mmol/L — ABNORMAL HIGH (ref 134–144)
Total Protein: 5.9 g/dL — ABNORMAL LOW (ref 6.0–8.5)

## 2021-02-04 DIAGNOSIS — I110 Hypertensive heart disease with heart failure: Secondary | ICD-10-CM | POA: Diagnosis not present

## 2021-02-04 DIAGNOSIS — G4733 Obstructive sleep apnea (adult) (pediatric): Secondary | ICD-10-CM | POA: Diagnosis not present

## 2021-02-04 DIAGNOSIS — I252 Old myocardial infarction: Secondary | ICD-10-CM | POA: Diagnosis not present

## 2021-02-04 DIAGNOSIS — Z86718 Personal history of other venous thrombosis and embolism: Secondary | ICD-10-CM | POA: Diagnosis not present

## 2021-02-04 DIAGNOSIS — Z8673 Personal history of transient ischemic attack (TIA), and cerebral infarction without residual deficits: Secondary | ICD-10-CM | POA: Diagnosis not present

## 2021-02-04 DIAGNOSIS — Z85118 Personal history of other malignant neoplasm of bronchus and lung: Secondary | ICD-10-CM | POA: Diagnosis not present

## 2021-02-04 DIAGNOSIS — E119 Type 2 diabetes mellitus without complications: Secondary | ICD-10-CM | POA: Diagnosis not present

## 2021-02-04 DIAGNOSIS — I255 Ischemic cardiomyopathy: Secondary | ICD-10-CM | POA: Diagnosis not present

## 2021-02-04 DIAGNOSIS — J449 Chronic obstructive pulmonary disease, unspecified: Secondary | ICD-10-CM | POA: Diagnosis not present

## 2021-02-04 DIAGNOSIS — I509 Heart failure, unspecified: Secondary | ICD-10-CM | POA: Diagnosis not present

## 2021-02-04 DIAGNOSIS — U071 COVID-19: Secondary | ICD-10-CM | POA: Diagnosis not present

## 2021-02-04 DIAGNOSIS — I2510 Atherosclerotic heart disease of native coronary artery without angina pectoris: Secondary | ICD-10-CM | POA: Diagnosis not present

## 2021-02-04 DIAGNOSIS — J9690 Respiratory failure, unspecified, unspecified whether with hypoxia or hypercapnia: Secondary | ICD-10-CM | POA: Diagnosis not present

## 2021-02-06 ENCOUNTER — Other Ambulatory Visit: Payer: Self-pay

## 2021-02-06 ENCOUNTER — Ambulatory Visit (INDEPENDENT_AMBULATORY_CARE_PROVIDER_SITE_OTHER): Payer: BC Managed Care – PPO

## 2021-02-06 DIAGNOSIS — I255 Ischemic cardiomyopathy: Secondary | ICD-10-CM

## 2021-02-06 DIAGNOSIS — Z9981 Dependence on supplemental oxygen: Secondary | ICD-10-CM

## 2021-02-06 DIAGNOSIS — Z86718 Personal history of other venous thrombosis and embolism: Secondary | ICD-10-CM

## 2021-02-06 DIAGNOSIS — Z8781 Personal history of (healed) traumatic fracture: Secondary | ICD-10-CM

## 2021-02-06 DIAGNOSIS — I110 Hypertensive heart disease with heart failure: Secondary | ICD-10-CM

## 2021-02-06 DIAGNOSIS — I509 Heart failure, unspecified: Secondary | ICD-10-CM

## 2021-02-06 DIAGNOSIS — Z95810 Presence of automatic (implantable) cardiac defibrillator: Secondary | ICD-10-CM

## 2021-02-06 DIAGNOSIS — Z8673 Personal history of transient ischemic attack (TIA), and cerebral infarction without residual deficits: Secondary | ICD-10-CM

## 2021-02-06 DIAGNOSIS — Z8616 Personal history of COVID-19: Secondary | ICD-10-CM | POA: Diagnosis not present

## 2021-02-06 DIAGNOSIS — I252 Old myocardial infarction: Secondary | ICD-10-CM

## 2021-02-06 DIAGNOSIS — J449 Chronic obstructive pulmonary disease, unspecified: Secondary | ICD-10-CM

## 2021-02-06 DIAGNOSIS — J9690 Respiratory failure, unspecified, unspecified whether with hypoxia or hypercapnia: Secondary | ICD-10-CM | POA: Diagnosis not present

## 2021-02-06 DIAGNOSIS — Z7901 Long term (current) use of anticoagulants: Secondary | ICD-10-CM | POA: Diagnosis not present

## 2021-02-06 DIAGNOSIS — Z720 Tobacco use: Secondary | ICD-10-CM

## 2021-02-06 DIAGNOSIS — Z7952 Long term (current) use of systemic steroids: Secondary | ICD-10-CM

## 2021-02-06 DIAGNOSIS — Z85118 Personal history of other malignant neoplasm of bronchus and lung: Secondary | ICD-10-CM

## 2021-02-06 DIAGNOSIS — G4733 Obstructive sleep apnea (adult) (pediatric): Secondary | ICD-10-CM

## 2021-02-06 DIAGNOSIS — I2510 Atherosclerotic heart disease of native coronary artery without angina pectoris: Secondary | ICD-10-CM

## 2021-02-06 DIAGNOSIS — E119 Type 2 diabetes mellitus without complications: Secondary | ICD-10-CM

## 2021-02-07 ENCOUNTER — Telehealth: Payer: Self-pay | Admitting: *Deleted

## 2021-02-07 DIAGNOSIS — I509 Heart failure, unspecified: Secondary | ICD-10-CM | POA: Diagnosis not present

## 2021-02-07 DIAGNOSIS — I255 Ischemic cardiomyopathy: Secondary | ICD-10-CM | POA: Diagnosis not present

## 2021-02-07 DIAGNOSIS — I252 Old myocardial infarction: Secondary | ICD-10-CM | POA: Diagnosis not present

## 2021-02-07 DIAGNOSIS — Z86711 Personal history of pulmonary embolism: Secondary | ICD-10-CM

## 2021-02-07 DIAGNOSIS — I2510 Atherosclerotic heart disease of native coronary artery without angina pectoris: Secondary | ICD-10-CM | POA: Diagnosis not present

## 2021-02-07 DIAGNOSIS — E119 Type 2 diabetes mellitus without complications: Secondary | ICD-10-CM | POA: Diagnosis not present

## 2021-02-07 DIAGNOSIS — Z85118 Personal history of other malignant neoplasm of bronchus and lung: Secondary | ICD-10-CM | POA: Diagnosis not present

## 2021-02-07 DIAGNOSIS — I110 Hypertensive heart disease with heart failure: Secondary | ICD-10-CM | POA: Diagnosis not present

## 2021-02-07 DIAGNOSIS — J9690 Respiratory failure, unspecified, unspecified whether with hypoxia or hypercapnia: Secondary | ICD-10-CM | POA: Diagnosis not present

## 2021-02-07 DIAGNOSIS — J449 Chronic obstructive pulmonary disease, unspecified: Secondary | ICD-10-CM | POA: Diagnosis not present

## 2021-02-07 DIAGNOSIS — U071 COVID-19: Secondary | ICD-10-CM | POA: Diagnosis not present

## 2021-02-07 DIAGNOSIS — Z86718 Personal history of other venous thrombosis and embolism: Secondary | ICD-10-CM

## 2021-02-07 DIAGNOSIS — G4733 Obstructive sleep apnea (adult) (pediatric): Secondary | ICD-10-CM | POA: Diagnosis not present

## 2021-02-07 DIAGNOSIS — Z8673 Personal history of transient ischemic attack (TIA), and cerebral infarction without residual deficits: Secondary | ICD-10-CM | POA: Diagnosis not present

## 2021-02-07 NOTE — Telephone Encounter (Signed)
Wife aware of results.

## 2021-02-07 NOTE — Telephone Encounter (Signed)
Spoke with wife, she said he has been taking Prednisone and for the last week has been on 1.5 mg of Coumadin.  Do you want him to increase to 3 mg?

## 2021-02-07 NOTE — Telephone Encounter (Signed)
Fax received mdINR PT/INR self testing service Test date/time 02/06/21 722pm INR 1.3

## 2021-02-07 NOTE — Telephone Encounter (Signed)
yes I had forgotten he was on prednisone on a lower dose, yes go ahead and increase back to his normal dose of 3 mg.

## 2021-02-07 NOTE — Telephone Encounter (Signed)
Description   I do not know what is changed all of a sudden because he has been very stable on his current dose, has he had a procedure or has he missed doses, why the sudden change?,  Please let me know but as for now I am not going to change his 3 mg dose until we figure out what happened.  Recheck in 1 week  INR 1.3 (goal is 2-3), Recheck in 1 to 2 weeks        Caryl Pina, MD [facility] 02/07/2021, 8:18 AM

## 2021-02-13 ENCOUNTER — Ambulatory Visit (INDEPENDENT_AMBULATORY_CARE_PROVIDER_SITE_OTHER): Payer: BC Managed Care – PPO | Admitting: Pulmonary Disease

## 2021-02-13 ENCOUNTER — Other Ambulatory Visit: Payer: Self-pay

## 2021-02-13 ENCOUNTER — Encounter: Payer: Self-pay | Admitting: Pulmonary Disease

## 2021-02-13 VITALS — BP 114/78 | HR 92 | Temp 97.1°F | Ht 69.0 in | Wt 206.0 lb

## 2021-02-13 DIAGNOSIS — J9611 Chronic respiratory failure with hypoxia: Secondary | ICD-10-CM

## 2021-02-13 MED ORDER — AZITHROMYCIN 250 MG PO TABS: 250.0000 mg | ORAL_TABLET | ORAL | 4 refills | Status: DC

## 2021-02-13 NOTE — Patient Instructions (Addendum)
Nice to meet you  Continue Trelegy 1 puff daily for now, if you get in the donut hole later in the year we can try to get creative.  We will make sure you get the medicines you need for your breathing.  Stop prednisone 5 mg daily  Resume azithromycin 1 pill every Monday, Wednesday, Friday.  A new prescription for this was sent.  We will walk you today and sent a new prescription to your oxygen company, [employer], so that you do not have a lapse in your oxygen.  Follow-up with Dr. Silas Flood in 3 months.  Please send a MyChart message or call the office with any questions or concerns.  Happy to see you sooner as needed.

## 2021-02-14 ENCOUNTER — Telehealth: Payer: Self-pay | Admitting: *Deleted

## 2021-02-14 DIAGNOSIS — Z86711 Personal history of pulmonary embolism: Secondary | ICD-10-CM

## 2021-02-14 DIAGNOSIS — Z86718 Personal history of other venous thrombosis and embolism: Secondary | ICD-10-CM

## 2021-02-14 NOTE — Telephone Encounter (Signed)
Description   Hold today's dose, take a half a tablet of the 3 mg tablets every Friday and continue 3 mg tablets the rest of the week.  INR 4.0 (goal is 2-3), Recheck in 1 to 2 weeks       Caryl Pina, MD [facility] 02/14/2021, 9:23 AM

## 2021-02-14 NOTE — Telephone Encounter (Signed)
Wife aware and verbalizes understanding per dpr.  °

## 2021-02-14 NOTE — Telephone Encounter (Signed)
Fax received mdINR PT/INR self testing service Test date/time 02/13/21 640 pm INR 4.0

## 2021-02-14 NOTE — Telephone Encounter (Signed)
Lmtcb.

## 2021-02-21 ENCOUNTER — Telehealth: Payer: Self-pay | Admitting: *Deleted

## 2021-02-21 ENCOUNTER — Telehealth: Payer: Self-pay

## 2021-02-21 DIAGNOSIS — Z86711 Personal history of pulmonary embolism: Secondary | ICD-10-CM

## 2021-02-21 DIAGNOSIS — Z86718 Personal history of other venous thrombosis and embolism: Secondary | ICD-10-CM

## 2021-02-21 NOTE — Telephone Encounter (Signed)
Fax received mdINR PT/INR self testing service Test date/time 02/20/21 611 pm INR 3.3

## 2021-02-21 NOTE — Telephone Encounter (Signed)
Description   Hold today's dose, take a half a tablet of the 3 mg tablets every Monday and Friday and continue 3 mg tablets the rest of the week.  INR 3.3 (goal is 2-3), Recheck in 1 to 2 weeks       Caryl Pina, MD [facility] 02/21/2021, 10:51 AM

## 2021-02-21 NOTE — Telephone Encounter (Signed)
Pts wife returned missed call regarding INR results. Reviewed results and Dr Neldon Mc advise per Dr Neldon Mc notes. Pts wife voiced understanding.

## 2021-02-21 NOTE — Telephone Encounter (Signed)
Lmtcb.
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
## 2021-02-28 ENCOUNTER — Telehealth: Payer: Self-pay | Admitting: *Deleted

## 2021-02-28 DIAGNOSIS — Z86718 Personal history of other venous thrombosis and embolism: Secondary | ICD-10-CM

## 2021-02-28 DIAGNOSIS — Z86711 Personal history of pulmonary embolism: Secondary | ICD-10-CM

## 2021-02-28 NOTE — Telephone Encounter (Signed)
Fax received mdINR PT/INR self testing service Test date/time 02/27/21 at 6:30 pm INR 2.8

## 2021-02-28 NOTE — Telephone Encounter (Signed)
Wife called and aware

## 2021-02-28 NOTE — Telephone Encounter (Signed)
Description   Continue to take half a tablet of the 3 mg tablets every Monday and Friday and continue 3 mg tablets the rest of the week.  INR 2.8 (goal is 2-3), Recheck in 1 to 2 weeks        Caryl Pina, MD [facility] 02/28/2021, 12:44 PM

## 2021-03-04 DIAGNOSIS — J449 Chronic obstructive pulmonary disease, unspecified: Secondary | ICD-10-CM | POA: Diagnosis not present

## 2021-03-06 DIAGNOSIS — Z86718 Personal history of other venous thrombosis and embolism: Secondary | ICD-10-CM | POA: Diagnosis not present

## 2021-03-06 DIAGNOSIS — Z7901 Long term (current) use of anticoagulants: Secondary | ICD-10-CM | POA: Diagnosis not present

## 2021-03-07 ENCOUNTER — Telehealth: Payer: Self-pay | Admitting: *Deleted

## 2021-03-07 DIAGNOSIS — Z86711 Personal history of pulmonary embolism: Secondary | ICD-10-CM

## 2021-03-07 DIAGNOSIS — Z86718 Personal history of other venous thrombosis and embolism: Secondary | ICD-10-CM

## 2021-03-07 NOTE — Telephone Encounter (Signed)
Description   Continue to take half a tablet of the 3 mg tablets every Monday and Friday and continue 3 mg tablets the rest of the week.  INR 2.6 (goal is 2-3), Recheck in 1 to 2 weeks       Caryl Pina, MD [facility] 03/07/2021, 9:09 AM

## 2021-03-07 NOTE — Telephone Encounter (Signed)
Butch Penny informed. No concerns at this time.

## 2021-03-07 NOTE — Telephone Encounter (Signed)
Fax received mdINR PT/INR self testing service Test date/time 03/06/21 627 pm INR 2.6

## 2021-03-14 ENCOUNTER — Telehealth: Payer: Self-pay | Admitting: *Deleted

## 2021-03-14 DIAGNOSIS — Z86718 Personal history of other venous thrombosis and embolism: Secondary | ICD-10-CM

## 2021-03-14 DIAGNOSIS — Z86711 Personal history of pulmonary embolism: Secondary | ICD-10-CM

## 2021-03-14 NOTE — Telephone Encounter (Signed)
Lmtcb.

## 2021-03-14 NOTE — Telephone Encounter (Signed)
Pts wife returned missed call. Reviewed Dr Neldon Mc notes with wife. Wife voiced understanding.

## 2021-03-14 NOTE — Telephone Encounter (Signed)
Fax received mdINR PT/INR self testing service Test date/time 03/14/21 605 am INR 2.4

## 2021-03-14 NOTE — Telephone Encounter (Signed)
Description   Continue to take half a tablet of the 3 mg tablets every Monday and Friday and continue 3 mg tablets the rest of the week.  INR 2.4 (goal is 2-3), Recheck in 1 to 2 weeks       Caryl Pina, MD [facility] 03/14/2021, 9:57 AM

## 2021-03-21 ENCOUNTER — Telehealth: Payer: Self-pay | Admitting: *Deleted

## 2021-03-21 DIAGNOSIS — Z86711 Personal history of pulmonary embolism: Secondary | ICD-10-CM

## 2021-03-21 DIAGNOSIS — Z86718 Personal history of other venous thrombosis and embolism: Secondary | ICD-10-CM

## 2021-03-21 NOTE — Telephone Encounter (Signed)
Fax received mdINR PT/INR self testing service Test date/time 03/20/21 642 pm INR 2.0

## 2021-03-21 NOTE — Telephone Encounter (Signed)
Description   Continue to take half a tablet of the 3 mg tablets every Monday and Friday and continue 3 mg tablets the rest of the week.  INR 2.0 (goal is 2-3), Recheck in 1 to 2 weeks       Caryl Pina, MD [facility] 03/21/2021, 12:13 PM

## 2021-03-21 NOTE — Telephone Encounter (Signed)
Wife aware and verbalizes understanding per dpr.  °

## 2021-03-28 ENCOUNTER — Telehealth: Payer: Self-pay | Admitting: *Deleted

## 2021-03-28 DIAGNOSIS — Z86718 Personal history of other venous thrombosis and embolism: Secondary | ICD-10-CM

## 2021-03-28 DIAGNOSIS — Z86711 Personal history of pulmonary embolism: Secondary | ICD-10-CM

## 2021-03-28 NOTE — Telephone Encounter (Signed)
Fax received mdINR PT/INR self testing service Test date/time 03/27/21 613 pm INR 2.1

## 2021-03-28 NOTE — Telephone Encounter (Signed)
Description   Continue to take half a tablet of the 3 mg tablets every Monday and Friday and continue 3 mg tablets the rest of the week.  INR 2.1 (goal is 2-3), Recheck in 1 to 2 weeks        Caryl Pina, MD [facility] 03/28/2021, 4:03 PM

## 2021-03-28 NOTE — Telephone Encounter (Signed)
Wife aware of Coumadin dosing directions

## 2021-04-01 ENCOUNTER — Ambulatory Visit (INDEPENDENT_AMBULATORY_CARE_PROVIDER_SITE_OTHER): Payer: BC Managed Care – PPO

## 2021-04-01 DIAGNOSIS — I428 Other cardiomyopathies: Secondary | ICD-10-CM

## 2021-04-02 LAB — CUP PACEART REMOTE DEVICE CHECK
Battery Remaining Longevity: 32 mo
Battery Remaining Percentage: 31 %
Battery Voltage: 2.84 V
Brady Statistic RV Percent Paced: 1 %
Date Time Interrogation Session: 20220404040016
HighPow Impedance: 81 Ohm
HighPow Impedance: 81 Ohm
Implantable Lead Implant Date: 20140205
Implantable Lead Location: 753860
Implantable Lead Model: 181
Implantable Lead Serial Number: 323859
Implantable Pulse Generator Implant Date: 20140205
Lead Channel Impedance Value: 430 Ohm
Lead Channel Pacing Threshold Amplitude: 1 V
Lead Channel Pacing Threshold Pulse Width: 0.5 ms
Lead Channel Sensing Intrinsic Amplitude: 11.2 mV
Lead Channel Setting Pacing Amplitude: 2.5 V
Lead Channel Setting Pacing Pulse Width: 0.5 ms
Lead Channel Setting Sensing Sensitivity: 0.5 mV
Pulse Gen Serial Number: 7040910

## 2021-04-03 DIAGNOSIS — I252 Old myocardial infarction: Secondary | ICD-10-CM | POA: Diagnosis not present

## 2021-04-03 DIAGNOSIS — I2510 Atherosclerotic heart disease of native coronary artery without angina pectoris: Secondary | ICD-10-CM | POA: Diagnosis not present

## 2021-04-03 DIAGNOSIS — E669 Obesity, unspecified: Secondary | ICD-10-CM | POA: Diagnosis not present

## 2021-04-03 DIAGNOSIS — G629 Polyneuropathy, unspecified: Secondary | ICD-10-CM | POA: Diagnosis not present

## 2021-04-03 DIAGNOSIS — H3530 Unspecified macular degeneration: Secondary | ICD-10-CM | POA: Diagnosis not present

## 2021-04-03 DIAGNOSIS — I10 Essential (primary) hypertension: Secondary | ICD-10-CM | POA: Diagnosis not present

## 2021-04-03 DIAGNOSIS — J449 Chronic obstructive pulmonary disease, unspecified: Secondary | ICD-10-CM | POA: Diagnosis not present

## 2021-04-03 DIAGNOSIS — Z86718 Personal history of other venous thrombosis and embolism: Secondary | ICD-10-CM | POA: Diagnosis not present

## 2021-04-03 DIAGNOSIS — E785 Hyperlipidemia, unspecified: Secondary | ICD-10-CM | POA: Diagnosis not present

## 2021-04-03 DIAGNOSIS — G3184 Mild cognitive impairment, so stated: Secondary | ICD-10-CM | POA: Diagnosis not present

## 2021-04-03 DIAGNOSIS — R69 Illness, unspecified: Secondary | ICD-10-CM | POA: Diagnosis not present

## 2021-04-03 DIAGNOSIS — Z7901 Long term (current) use of anticoagulants: Secondary | ICD-10-CM | POA: Diagnosis not present

## 2021-04-04 ENCOUNTER — Telehealth: Payer: Self-pay | Admitting: *Deleted

## 2021-04-04 DIAGNOSIS — Z86711 Personal history of pulmonary embolism: Secondary | ICD-10-CM

## 2021-04-04 DIAGNOSIS — J449 Chronic obstructive pulmonary disease, unspecified: Secondary | ICD-10-CM | POA: Diagnosis not present

## 2021-04-04 DIAGNOSIS — Z86718 Personal history of other venous thrombosis and embolism: Secondary | ICD-10-CM

## 2021-04-04 NOTE — Telephone Encounter (Signed)
Description   Continue to take half a tablet of the 3 mg tablets every Monday and Friday and continue 3 mg tablets the rest of the week.  INR 1.9 (goal is 2-3), slightly off but no change Recheck in 1 to 2 weeks        Caryl Pina, MD [facility] 04/04/2021, 2:58 PM

## 2021-04-04 NOTE — Telephone Encounter (Signed)
Patient aware and verbalized understanding. °

## 2021-04-04 NOTE — Telephone Encounter (Signed)
Fax received mdINR PT/INR self testing service Test date/time 04/03/21 614 pm INR 1.9

## 2021-04-11 ENCOUNTER — Telehealth: Payer: Self-pay

## 2021-04-11 DIAGNOSIS — Z86718 Personal history of other venous thrombosis and embolism: Secondary | ICD-10-CM

## 2021-04-11 DIAGNOSIS — Z86711 Personal history of pulmonary embolism: Secondary | ICD-10-CM

## 2021-04-11 NOTE — Telephone Encounter (Signed)
Description   Continue to take half a tablet of the 3 mg tablets every Monday and Friday and continue 3 mg tablets the rest of the week.  INR 2.7 (goal is 2-3), slightly off but no change Recheck in 1 to 2 weeks       Caryl Pina, MD [facility] 04/11/2021, 7:53 AM

## 2021-04-11 NOTE — Telephone Encounter (Signed)
Fax received mdINR PT/INR self testing service Test date/time 04/10/21 7:13 pm INR 2.7

## 2021-04-11 NOTE — Progress Notes (Signed)
Remote ICD transmission.   

## 2021-04-11 NOTE — Telephone Encounter (Signed)
Pt aware.

## 2021-04-18 ENCOUNTER — Telehealth: Payer: Self-pay | Admitting: *Deleted

## 2021-04-18 DIAGNOSIS — Z86711 Personal history of pulmonary embolism: Secondary | ICD-10-CM

## 2021-04-18 DIAGNOSIS — Z86718 Personal history of other venous thrombosis and embolism: Secondary | ICD-10-CM

## 2021-04-18 NOTE — Telephone Encounter (Signed)
[name] made aware.

## 2021-04-18 NOTE — Telephone Encounter (Signed)
Fax received mdINR PT/INR self testing service Test date/time 04/17/21 811 pm INR 2.4

## 2021-04-18 NOTE — Telephone Encounter (Signed)
Continue to take half a tablet of the 3 mg tablets every Monday and Friday and continue 3 mg tablets the rest of the week.

## 2021-04-25 ENCOUNTER — Telehealth: Payer: Self-pay | Admitting: *Deleted

## 2021-04-25 DIAGNOSIS — Z86718 Personal history of other venous thrombosis and embolism: Secondary | ICD-10-CM

## 2021-04-25 DIAGNOSIS — Z86711 Personal history of pulmonary embolism: Secondary | ICD-10-CM

## 2021-04-25 NOTE — Telephone Encounter (Signed)
Description   Continue to take half a tablet of the 3 mg tablets every Monday and Friday and continue 3 mg tablets the rest of the week.  INR 2.3 (goal is 2-3), slightly off but no change Recheck in 1 to 2 weeks       Caryl Pina, MD [facility] 04/25/2021, 10:20 AM

## 2021-04-25 NOTE — Telephone Encounter (Signed)
Fax received mdINR PT/INR self testing service Test date/time 04/24/21 700 pm INR 2.3

## 2021-04-25 NOTE — Telephone Encounter (Signed)
Pts wife [name] informed of results and how to administer Coumadin.

## 2021-04-29 ENCOUNTER — Encounter: Payer: Self-pay | Admitting: Urology

## 2021-04-29 ENCOUNTER — Telehealth: Payer: Self-pay | Admitting: *Deleted

## 2021-04-29 NOTE — Telephone Encounter (Signed)
CALLED PATIENT TO INFORM OF LABS AND CT ON 05-01-21- ARRIVAL TIME- 4:45 PM @ [facility], PATIENT TO HAVE WATER ONLY- 4 HRS. PRIOR TO TEST, ASHLYN BRUNING TO CALL PATIENT WITH RESULTS ON 05-02-21 @ 1PM, LVM FOR A RETURN CALL

## 2021-04-30 ENCOUNTER — Telehealth: Payer: Self-pay | Admitting: *Deleted

## 2021-04-30 NOTE — Telephone Encounter (Signed)
Called patient's wife - Travis Cruz to inform that they need to arrive @ 3:45 pm for Stat labs on 05-01-21 @ [facility] and his CT to follow on 05-01-21 - arrival time - 4:45 pm @ [facility], lvm for a return call

## 2021-05-01 ENCOUNTER — Ambulatory Visit (HOSPITAL_COMMUNITY)
Admission: RE | Admit: 2021-05-01 | Discharge: 2021-05-01 | Disposition: A | Discharge status: Home / Self Care | Payer: Medicare HMO | Source: Ambulatory Visit | Attending: Urology | Admitting: Urology

## 2021-05-01 ENCOUNTER — Ambulatory Visit (INDEPENDENT_AMBULATORY_CARE_PROVIDER_SITE_OTHER): Payer: Medicare HMO | Admitting: Family Medicine

## 2021-05-01 ENCOUNTER — Other Ambulatory Visit: Payer: Self-pay

## 2021-05-01 ENCOUNTER — Encounter: Payer: Self-pay | Admitting: Family Medicine

## 2021-05-01 ENCOUNTER — Encounter (HOSPITAL_COMMUNITY): Payer: Self-pay

## 2021-05-01 VITALS — BP 112/63 | HR 94 | Ht 69.0 in | Wt 221.0 lb

## 2021-05-01 DIAGNOSIS — C3411 Malignant neoplasm of upper lobe, right bronchus or lung: Secondary | ICD-10-CM | POA: Diagnosis not present

## 2021-05-01 DIAGNOSIS — E785 Hyperlipidemia, unspecified: Secondary | ICD-10-CM | POA: Diagnosis not present

## 2021-05-01 DIAGNOSIS — I10 Essential (primary) hypertension: Secondary | ICD-10-CM | POA: Diagnosis not present

## 2021-05-01 DIAGNOSIS — I5042 Chronic combined systolic (congestive) and diastolic (congestive) heart failure: Secondary | ICD-10-CM | POA: Diagnosis not present

## 2021-05-01 DIAGNOSIS — F0391 Unspecified dementia with behavioral disturbance: Secondary | ICD-10-CM | POA: Diagnosis not present

## 2021-05-01 DIAGNOSIS — N400 Enlarged prostate without lower urinary tract symptoms: Secondary | ICD-10-CM | POA: Diagnosis not present

## 2021-05-01 DIAGNOSIS — I5032 Chronic diastolic (congestive) heart failure: Secondary | ICD-10-CM

## 2021-05-01 DIAGNOSIS — C3412 Malignant neoplasm of upper lobe, left bronchus or lung: Secondary | ICD-10-CM

## 2021-05-01 DIAGNOSIS — J449 Chronic obstructive pulmonary disease, unspecified: Secondary | ICD-10-CM | POA: Diagnosis not present

## 2021-05-01 DIAGNOSIS — R69 Illness, unspecified: Secondary | ICD-10-CM | POA: Diagnosis not present

## 2021-05-01 LAB — POCT I-STAT CREATININE: Creatinine, Ser: 1 mg/dL (ref 0.61–1.24)

## 2021-05-01 MED ORDER — FINASTERIDE 5 MG PO TABS: 5.0000 mg | ORAL_TABLET | Freq: Every day | ORAL | 3 refills | Status: DC

## 2021-05-01 MED ORDER — IOHEXOL 300 MG/ML  SOLN
75.0000 mL | Freq: Once | INTRAMUSCULAR | Status: AC | PRN
  Administered 2021-05-01: 75 mL via INTRAVENOUS

## 2021-05-01 NOTE — Progress Notes (Signed)
BP 112/63   Pulse 94   Ht '5\' 9"'  (1.753 m)   Wt 221 lb (100.2 kg)   SpO2 95%   BMI 32.64 kg/m    Subjective:   Patient ID: Travis Cruz, male    DOB: [birth year], 75 y.o.   MRN: [id number]  HPI: Travis Cruz is a 75 y.o. male presenting on 05/01/2021 for Medical Management of Chronic Issues, Hyperlipidemia, and Hypertension   HPI Hypertension and CHF recheck Patient is currently on furosemide and metoprolol, and their blood pressure today is 112/63. Patient denies any lightheadedness or dizziness. Patient denies headaches, blurred vision, chest pains, shortness of breath, or weakness. Denies any side effects from medication and is content with current medication.   Hyperlipidemia Patient is coming in for recheck of his hyperlipidemia. The patient is currently taking atorvastatin. They deny any issues with myalgias or history of liver damage from it. They deny any focal numbness or weakness or chest pain.   COPD Patient is coming in for COPD recheck today.  He is currently on albuterol and Trelegy, follows with pulmonology, also on oxygen 3 L nasal cannula.  He has a mild chronic cough but denies any major coughing spells or wheezing spells.  He has 2nighttime symptoms per week and 5daytime symptoms per week currently.   BPH Patient is coming in for recheck on BPH Symptoms: None currently, seems to be doing well per wife Medication: Finasteride and Flomax   Dementia with behavioral disturbance recheck Patient is coming in for dementia and behavior and wife says he is doing really well on the Zoloft.  She says is been not very anxious for been stable and is very happy with where he is doing.  Relevant past medical, surgical, family and social history reviewed and updated as indicated. Interim medical history since our last visit reviewed. Allergies and medications reviewed and updated.  Review of Systems  Constitutional: Negative for chills and fever.  Eyes: Negative  for visual disturbance.  Respiratory: Positive for shortness of breath (On oxygen, stable). Negative for wheezing.   Cardiovascular: Negative for chest pain and leg swelling.  Musculoskeletal: Positive for gait problem (Mostly wheelchair-bound). Negative for back pain.  Skin: Negative for rash.  Neurological: Negative for dizziness, weakness and light-headedness.  All other systems reviewed and are negative.   Per HPI unless specifically indicated above   Allergies as of 05/01/2021      Reactions   Ativan [lorazepam] Anxiety, Other (See Comments)   Hyper  [name]   Zinacef [cefuroxime] Rash      Medication List       Accurate as of May 01, 2021  8:41 AM. If you have any questions, ask your nurse or doctor.        acetaminophen 325 MG tablet Commonly known as: TYLENOL Take 2 tablets (650 mg total) by mouth every 6 (six) hours as needed for mild pain, fever or headache (or Fever >/= 101).   albuterol 108 (90 Base) MCG/ACT inhaler Commonly known as: VENTOLIN HFA Inhale 2 puffs into the lungs every 4 (four) hours as needed for wheezing or shortness of breath.   ascorbic acid 500 MG tablet Commonly known as: VITAMIN C Take 1 tablet (500 mg total) by mouth daily.   atorvastatin 40 MG tablet Commonly known as: LIPITOR Take 1 tablet (40 mg total) by mouth every Monday, Wednesday, and Friday.   azithromycin 250 MG tablet Commonly known as: ZITHROMAX Take  1 tablet (250 mg total) by mouth 3 (three) times a week. Monday Wednesday Friday.   finasteride 5 MG tablet Commonly known as: PROSCAR Take 1 tablet (5 mg total) by mouth daily.   furosemide 20 MG tablet Commonly known as: LASIX Take 1 tablet (20 mg total) by mouth daily.   metoprolol tartrate 25 MG tablet Commonly known as: LOPRESSOR Take 1.5 tablets (37.5 mg total) by mouth 2 (two) times daily.   multivitamin with minerals Tabs tablet Take 1 tablet by mouth daily.    OXYGEN Inhale 3 L/L into the lungs.   PRESERVISION AREDS PO Take 1 capsule by mouth 2 (two) times daily.   sertraline 100 MG tablet Commonly known as: ZOLOFT Take 1 tablet (100 mg total) by mouth daily.   tamsulosin 0.4 MG Caps capsule Commonly known as: FLOMAX Take 1 capsule (0.4 mg total) by mouth daily after supper. (Needs to be seen before next refill)   Trelegy Ellipta 100-62.5-25 MCG/INH Aepb Generic drug: Fluticasone-Umeclidin-Vilant Inhale 1 puff into the lungs daily.   Vitamin D3 50 MCG (2000 UT) Tabs Take 2,000 Units by mouth daily.   warfarin 2 MG tablet Commonly known as: COUMADIN Take as directed by the anticoagulation clinic. If you are unsure how to take this medication, talk to your nurse or doctor. Original instructions: Take 1 tablet (2 mg total) by mouth every evening. What changed: how much to take        Objective:   BP 112/63   Pulse 94   Ht '5\' 9"'  (1.753 m)   Wt 221 lb (100.2 kg)   SpO2 95%   BMI 32.64 kg/m   Wt Readings from Last 3 Encounters:  05/01/21 221 lb (100.2 kg)  02/13/21 206 lb (93.4 kg)  02/01/21 209 lb (94.8 kg)    Physical Exam Vitals and nursing note reviewed.  Constitutional:      General: He is not in acute distress.    Appearance: He is well-developed. He is not diaphoretic.  Eyes:     General: No scleral icterus.    Conjunctiva/sclera: Conjunctivae normal.  Neck:     Thyroid: No thyromegaly.  Cardiovascular:     Rate and Rhythm: Normal rate and regular rhythm.     Heart sounds: Normal heart sounds. No murmur heard.   Pulmonary:     Effort: Pulmonary effort is normal. No respiratory distress.     Breath sounds: Normal breath sounds. No wheezing, rhonchi or rales.  Musculoskeletal:        General: Normal range of motion.  Skin:    General: Skin is warm and dry.     Findings: No rash.  Neurological:     Mental Status: He is alert and oriented to person, place, and time.     Coordination: Coordination normal.   Psychiatric:        Behavior: Behavior normal.       Assessment & Plan:   Problem List Items Addressed This Visit      Cardiovascular and Mediastinum   Hypertension   Systolic and diastolic CHF, chronic (HCC)   RESOLVED: Chronic diastolic CHF (congestive heart failure) (HCC)     Respiratory   COPD (chronic obstructive pulmonary disease) (HCC)     Nervous and Auditory   Dementia with behavioral disturbance (HCC) - Primary     Other   Hyperlipidemia   Relevant Orders   Lipid panel   CMP14+EGFR    Other Visit Diagnoses    Benign prostatic hyperplasia, unspecified  whether lower urinary tract symptoms present       Relevant Medications   finasteride (PROSCAR) 5 MG tablet      Continue current medication, seems to be doing well, continue to follow-up with pulmonology and cardiology. Follow up plan: Return in about 6 months (around 11/01/2021), or if symptoms worsen or fail to improve, for Hypertension and COPD and dementia.  Counseling provided for all of the vaccine components Orders Placed This Encounter  Procedures  . Lipid panel  . CMP14+EGFR    Caryl Pina, MD [place] 05/01/2021, 8:41 AM

## 2021-05-02 ENCOUNTER — Telehealth: Payer: Self-pay | Admitting: *Deleted

## 2021-05-02 ENCOUNTER — Ambulatory Visit
Admission: RE | Admit: 2021-05-02 | Discharge: 2021-05-02 | Disposition: A | Discharge status: Home / Self Care | Payer: Medicare HMO | Source: Ambulatory Visit | Attending: Urology | Admitting: Urology

## 2021-05-02 DIAGNOSIS — Z08 Encounter for follow-up examination after completed treatment for malignant neoplasm: Secondary | ICD-10-CM | POA: Diagnosis not present

## 2021-05-02 DIAGNOSIS — Z86718 Personal history of other venous thrombosis and embolism: Secondary | ICD-10-CM

## 2021-05-02 DIAGNOSIS — C3412 Malignant neoplasm of upper lobe, left bronchus or lung: Secondary | ICD-10-CM | POA: Diagnosis not present

## 2021-05-02 DIAGNOSIS — Z7901 Long term (current) use of anticoagulants: Secondary | ICD-10-CM | POA: Diagnosis not present

## 2021-05-02 DIAGNOSIS — Z86711 Personal history of pulmonary embolism: Secondary | ICD-10-CM

## 2021-05-02 LAB — CMP14+EGFR
ALT: 18 IU/L (ref 0–44)
AST: 23 IU/L (ref 0–40)
Albumin/Globulin Ratio: 1.5 (ref 1.2–2.2)
Albumin: 4 g/dL (ref 3.7–4.7)
Alkaline Phosphatase: 155 IU/L — ABNORMAL HIGH (ref 44–121)
BUN/Creatinine Ratio: 22 (ref 10–24)
BUN: 18 mg/dL (ref 8–27)
Bilirubin Total: 0.4 mg/dL (ref 0.0–1.2)
CO2: 29 mmol/L (ref 20–29)
Calcium: 9.8 mg/dL (ref 8.6–10.2)
Chloride: 100 mmol/L (ref 96–106)
Creatinine, Ser: 0.81 mg/dL (ref 0.76–1.27)
Globulin, Total: 2.7 g/dL (ref 1.5–4.5)
Glucose: 100 mg/dL — ABNORMAL HIGH (ref 65–99)
Potassium: 4.3 mmol/L (ref 3.5–5.2)
Sodium: 141 mmol/L (ref 134–144)
Total Protein: 6.7 g/dL (ref 6.0–8.5)
eGFR: 93 mL/min/{1.73_m2} (ref >59)

## 2021-05-02 LAB — LIPID PANEL
Chol/HDL Ratio: 2.9 ratio (ref 0.0–5.0)
Cholesterol, Total: 173 mg/dL (ref 100–199)
HDL: 59 mg/dL (ref >39)
LDL Chol Calc (NIH): 95 mg/dL (ref 0–99)
Triglycerides: 107 mg/dL (ref 0–149)
VLDL Cholesterol Cal: 19 mg/dL (ref 5–40)

## 2021-05-02 NOTE — Telephone Encounter (Signed)
Fax received mdINR PT/INR self testing service Test date/time 05/02/21 1216 am INR 2.2

## 2021-05-02 NOTE — Progress Notes (Signed)
Radiation Oncology         [phone] ________________________________  Name: Travis Cruz MRN: [id number]  Date: 05/02/2021  DOB: Mar 11, 1946  Outpatient Follow up Visit  CC: Dettinger, Fransisca Kaufmann, MD  Grace Isaac, MD  Diagnosis:  Putative metachronous stage IA NSCLC of the right upper lobe lung in patient with a history of left upper lobe NSCLC, squamous cell carcinoma, and putative NSCLC of the right lower lung - Clinical stage IA     Interval Since Last Radiation:  1.5 year s/p SBRT to the RUL  10/10/19, 10/14/19, 10/17/19//SBRT:  The RUL lung target was treated to 54 Gy in 3 fractions of 18 Gy  03/23/2017 to 04/02/2017:  The RLL target was treated to 50 Gy in 5 fractions of 10 Gy    11/07/13-11/16/13:  Left upper lobe / 60 Gy in 5 fractions at 12 Gray per fraction  Narrative:  I spoke with the patient's wife, Travis Cruz, due to his significant, progressive dementia, to conduct his routine 6 month follow up visit to review results from recent CT Chest scan performed on 05/01/21 via telephone to spare the patient unnecessary potential exposure in the healthcare setting during the current COVID-19 pandemic.  The patient was notified in advance and gave permission to proceed with this visit format.  He was hospitalized in 02/2020 and again in 11/2020 for acute hypoxic respiratory failure secondary to acute exacerbation of COPD and Covid in 11/2020. He was treated with multiple rounds of antibiotics and steroids which his wife feels has made an improvement in his breathing. He is followed with [facility], c/o Dr. Silas Flood and previously with Dr. Noemi Chapel.  His most recent visit with Dr. Silas Flood was 02/13/21 and has a follow up visit scheduled for 05/08/21 at 4:30pm.  Since his most recent discharge home from the hospital, he has remained clinically stable.   His recent CT Chest showed an overall stable appearance of the lungs with chronic postradiation changes of mass-like fibrosis  in the lungs bilaterally, similar to the most recent prior examination and no definitive findings to suggest recurrent or metastatic disease in the thorax. This was discussed with his wife, Travis Cruz, today.  On review of systems, obtained from wife, Travis Cruz, due to patient's dementia, the patient states that he is doing well and is currently without complaints.  He specifically denies dysphagia, chest pain, productive cough, hemoptysis, fever, chills, N/V.  He reports a decent appetite and is working on maintaining his weight.  His energy level is low but stable.  He continues on 3-4 L of oxygen via nasal cannula 24/7.  He denies fever, chills or night sweats.  ALLERGIES:  is allergic to ativan [lorazepam], cefuroxime axetil, and zinacef [cefuroxime].  Meds: Current Outpatient Medications  Medication Sig Dispense Refill  . acetaminophen (TYLENOL) 325 MG tablet Take 2 tablets (650 mg total) by mouth every 6 (six) hours as needed for mild pain, fever or headache (or Fever >/= 101). 12 tablet 9  . albuterol (VENTOLIN HFA) 108 (90 Base) MCG/ACT inhaler Inhale 2 puffs into the lungs every 4 (four) hours as needed for wheezing or shortness of breath. 18 g 11  . ascorbic acid (VITAMIN C) 500 MG tablet Take 1 tablet (500 mg total) by mouth daily. 30 tablet 0  . atorvastatin (LIPITOR) 40 MG tablet Take 1 tablet (40 mg total) by mouth every Monday, Wednesday, and Friday. 15 tablet 2  . azithromycin (ZITHROMAX) 250 MG tablet Take 1 tablet (250 mg  total) by mouth 3 (three) times a week. Monday Wednesday Friday. 30 tablet 4  . Cholecalciferol (VITAMIN D3) 2000 units TABS Take 2,000 Units by mouth daily.     . finasteride (PROSCAR) 5 MG tablet Take 1 tablet (5 mg total) by mouth daily. 90 tablet 3  . Fluticasone-Umeclidin-Vilant (TRELEGY ELLIPTA) 100-62.5-25 MCG/INH AEPB Inhale 1 puff into the lungs daily. 60 each 11  . furosemide (LASIX) 20 MG tablet Take 1 tablet (20 mg total) by mouth daily. 30 tablet 2  .  metoprolol tartrate (LOPRESSOR) 25 MG tablet Take 1.5 tablets (37.5 mg total) by mouth 2 (two) times daily. 90 tablet 3  . Multiple Vitamin (MULTIVITAMIN WITH MINERALS) TABS tablet Take 1 tablet by mouth daily.    . Multiple Vitamins-Minerals (PRESERVISION AREDS PO) Take 1 capsule by mouth 2 (two) times daily.    . OXYGEN Inhale 3 L/L into the lungs.    . sertraline (ZOLOFT) 100 MG tablet Take 1 tablet (100 mg total) by mouth daily. 90 tablet 3  . tamsulosin (FLOMAX) 0.4 MG CAPS capsule Take 1 capsule (0.4 mg total) by mouth daily after supper. (Needs to be seen before next refill) 90 capsule 3  . warfarin (COUMADIN) 2 MG tablet Take 1 tablet (2 mg total) by mouth every evening. (Patient taking differently: Take 3 mg by mouth every evening.) 30 tablet 2   No current facility-administered medications for this encounter.    Physical Findings:  vitals were not taken for this visit.   /Unable to assess due to telephone follow-up visit format.  Lab Findings: Lab Results  Component Value Date   WBC 15.7 (H) 02/01/2021   HGB 15.9 02/01/2021   HCT 47.7 02/01/2021   MCV 87 02/01/2021   PLT 318 02/01/2021     Radiographic Findings: No results found.  Impression/Plan: 1. 75 y/o male with putative stage IA NSCLC in the right upper lobe lung and a history of left upper lobe NSCLC, squamous cell carcinoma, and putative NSCLC of the right lower lung - Clinical stage IA. He has recovered well from the effects of his recent radiotherapy and is currently without complaints.  His recent CT chest shows overall stable appearance with no evidence of new or progressive lesions or lymphadenopathy.  Therefore, we will continue with serial CT chest scans every 6 months at [facility] for convenience, with a follow-up visit or phone call thereafter to review results and recommendations.  They are comfortable with and in agreement with this plan and know to call at any time with any questions or concerns  in the interim. I will forward a copy of my note today and recent scan results to Dr. Servando Snare, Dr. Warrick Parisian, Dr. Silas Flood and Dr. Caryl Comes for their records as well.    Given current concerns for patient exposure during the COVID-19 pandemic, this encounter was conducted via telephone. The patient and his wife, Travis Cruz, were notified in advance and offered a [place] meeting to allow for face to face communication but unfortunately reported that they did not have the appropriate resources/technology to support such a visit and instead preferred to proceed with telephone follow up. The patient/wife has given verbal consent for this type of encounter. The time spent during this encounter was 15 minutes. The attendants for this meeting include Jacoby Zanni PA-C, patient, Meshulem Onorato and his wife, Travis Cruz. During the encounter, Gryphon Vanderveen PA-C, was located at [facility] Rad[facility] On[facility] Department.  Patient, Ingram Onnen and his wife, Travis Cruz,  were located at home.   Nicholos Johns, PA-C    Tyler Pita, MD  [facility] Direct Dial: [phone]  Fax: [phone] [email or web address]  Skype  LinkedIn

## 2021-05-02 NOTE — Telephone Encounter (Signed)
Description   Continue to take half a tablet of the 3 mg tablets every Monday and Friday and continue 3 mg tablets the rest of the week.  INR 2.2 (goal is 2-3), slightly off but no change Recheck in 1 to 2 weeks       Caryl Pina, MD [facility] 05/02/2021, 9:31 AM

## 2021-05-02 NOTE — Telephone Encounter (Signed)
Pts wife [name] informed. No concerns.

## 2021-05-04 DIAGNOSIS — J449 Chronic obstructive pulmonary disease, unspecified: Secondary | ICD-10-CM | POA: Diagnosis not present

## 2021-05-08 ENCOUNTER — Ambulatory Visit: Payer: Medicare HMO | Admitting: Pulmonary Disease

## 2021-05-08 ENCOUNTER — Other Ambulatory Visit: Payer: Self-pay

## 2021-05-08 ENCOUNTER — Encounter: Payer: Self-pay | Admitting: Pulmonary Disease

## 2021-05-08 VITALS — BP 118/68 | HR 81 | Temp 98.2°F | Ht 69.0 in | Wt 225.0 lb

## 2021-05-08 DIAGNOSIS — J9611 Chronic respiratory failure with hypoxia: Secondary | ICD-10-CM | POA: Diagnosis not present

## 2021-05-08 DIAGNOSIS — J449 Chronic obstructive pulmonary disease, unspecified: Secondary | ICD-10-CM

## 2021-05-08 NOTE — Patient Instructions (Signed)
Glad you are doing well  Continue all medicines - no changes today  Return for follow up in 6 months or sooner as needed

## 2021-05-09 ENCOUNTER — Telehealth: Payer: Self-pay | Admitting: *Deleted

## 2021-05-09 DIAGNOSIS — Z86718 Personal history of other venous thrombosis and embolism: Secondary | ICD-10-CM

## 2021-05-09 DIAGNOSIS — Z86711 Personal history of pulmonary embolism: Secondary | ICD-10-CM

## 2021-05-09 NOTE — Telephone Encounter (Signed)
Fax received mdINR PT/INR self testing service Test date/time 05/08/21 703 pm INR 2.4

## 2021-05-09 NOTE — Telephone Encounter (Signed)
Patient aware and verbalized understanding. °

## 2021-05-09 NOTE — Telephone Encounter (Signed)
Description   Continue to take half a tablet of the 3 mg tablets every Monday and Friday and continue 3 mg tablets the rest of the week.  INR 2.4 (goal is 2-3), slightly off but no change Recheck in 1 to 2 weeks       Caryl Pina, MD [facility] 05/09/2021, 4:34 PM
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
## 2021-05-14 DIAGNOSIS — G4733 Obstructive sleep apnea (adult) (pediatric): Secondary | ICD-10-CM | POA: Diagnosis not present

## 2021-05-16 ENCOUNTER — Telehealth: Payer: Self-pay | Admitting: *Deleted

## 2021-05-16 DIAGNOSIS — Z86711 Personal history of pulmonary embolism: Secondary | ICD-10-CM

## 2021-05-16 DIAGNOSIS — Z86718 Personal history of other venous thrombosis and embolism: Secondary | ICD-10-CM

## 2021-05-16 NOTE — Telephone Encounter (Signed)
Patients wife aware

## 2021-05-16 NOTE — Telephone Encounter (Signed)
Description   Continue to take half a tablet of the 3 mg tablets every Monday and Friday and continue 3 mg tablets the rest of the week.  INR 2.3 (goal is 2-3),  Recheck in 1 to 2 weeks       Caryl Pina, MD [facility] 05/16/2021, 3:56 PM

## 2021-05-16 NOTE — Telephone Encounter (Signed)
Fax received mdINR PT/INR self testing service Test date/time 05/15/21 558 pm INR 2.3

## 2021-05-23 ENCOUNTER — Telehealth: Payer: Self-pay | Admitting: *Deleted

## 2021-05-23 DIAGNOSIS — Z86718 Personal history of other venous thrombosis and embolism: Secondary | ICD-10-CM

## 2021-05-23 DIAGNOSIS — Z86711 Personal history of pulmonary embolism: Secondary | ICD-10-CM

## 2021-05-23 NOTE — Telephone Encounter (Signed)
Description   Continue to take half a tablet of the 3 mg tablets every Monday and Friday and continue 3 mg tablets the rest of the week.  INR 2.0 (goal is 2-3),  Recheck in 1 to 2 weeks       Caryl Pina, MD [facility] 05/23/2021, 12:36 PM

## 2021-05-23 NOTE — Telephone Encounter (Signed)
Fax received mdINR PT/INR self testing service Test date/time 05/22/21 557pm INR 2.0

## 2021-05-23 NOTE — Telephone Encounter (Signed)
Patient aware and verbalized understanding. °

## 2021-05-29 DIAGNOSIS — Z86718 Personal history of other venous thrombosis and embolism: Secondary | ICD-10-CM | POA: Diagnosis not present

## 2021-05-29 DIAGNOSIS — Z7901 Long term (current) use of anticoagulants: Secondary | ICD-10-CM | POA: Diagnosis not present

## 2021-05-30 ENCOUNTER — Telehealth: Payer: Self-pay | Admitting: *Deleted

## 2021-05-30 DIAGNOSIS — Z86718 Personal history of other venous thrombosis and embolism: Secondary | ICD-10-CM

## 2021-05-30 DIAGNOSIS — Z86711 Personal history of pulmonary embolism: Secondary | ICD-10-CM

## 2021-05-30 NOTE — Telephone Encounter (Signed)
Fax received mdINR PT/INR self testing service Test date/time 05/29/21 606 pm INR 2.1

## 2021-05-30 NOTE — Telephone Encounter (Signed)
Pt aware.

## 2021-05-30 NOTE — Telephone Encounter (Signed)
Description   Continue to take half a tablet of the 3 mg tablets every Monday and Friday and continue 3 mg tablets the rest of the week.  INR 2.1 (goal is 2-3),  Recheck in 1 to 2 weeks       Caryl Pina, MD [facility] 05/30/2021, 9:06 AM

## 2021-06-04 DIAGNOSIS — J449 Chronic obstructive pulmonary disease, unspecified: Secondary | ICD-10-CM | POA: Diagnosis not present

## 2021-06-06 ENCOUNTER — Telehealth: Payer: Self-pay

## 2021-06-06 DIAGNOSIS — Z86711 Personal history of pulmonary embolism: Secondary | ICD-10-CM

## 2021-06-06 DIAGNOSIS — Z86718 Personal history of other venous thrombosis and embolism: Secondary | ICD-10-CM

## 2021-06-06 NOTE — Telephone Encounter (Signed)
Fax received mdINR PT/INR self testing service Test date/time 06/05/21 6:21 pm INR 2.1

## 2021-06-06 NOTE — Telephone Encounter (Signed)
Description   Continue to take half a tablet of the 3 mg tablets every Monday and Friday and continue 3 mg tablets the rest of the week.  INR 2.1 (goal is 2-3),  Recheck in 1 to 2 weeks        Caryl Pina, MD [facility] 06/06/2021, 10:40 AM

## 2021-06-13 ENCOUNTER — Telehealth: Payer: Self-pay | Admitting: *Deleted

## 2021-06-13 DIAGNOSIS — Z86718 Personal history of other venous thrombosis and embolism: Secondary | ICD-10-CM

## 2021-06-13 DIAGNOSIS — Z86711 Personal history of pulmonary embolism: Secondary | ICD-10-CM

## 2021-06-13 NOTE — Telephone Encounter (Signed)
Description   Continue to take half a tablet of the 3 mg tablets every Monday and Friday and continue 3 mg tablets the rest of the week.  INR 2.0 (goal is 2-3),  Recheck in 1 to 2 weeks       Caryl Pina, MD [facility] 06/13/2021, 8:44 AM

## 2021-06-13 NOTE — Telephone Encounter (Signed)
Fax received mdINR PT/INR self testing service Test date/time 06/12/21 602 pm INR 2.0

## 2021-06-13 NOTE — Telephone Encounter (Signed)
Patients wife aware and verbalized understanding

## 2021-06-16 ENCOUNTER — Encounter: Payer: Self-pay | Admitting: Family Medicine

## 2021-06-17 ENCOUNTER — Other Ambulatory Visit: Payer: Self-pay | Admitting: *Deleted

## 2021-06-17 DIAGNOSIS — I5032 Chronic diastolic (congestive) heart failure: Secondary | ICD-10-CM

## 2021-06-17 DIAGNOSIS — I10 Essential (primary) hypertension: Secondary | ICD-10-CM

## 2021-06-17 MED ORDER — METOPROLOL TARTRATE 25 MG PO TABS: 37.5000 mg | ORAL_TABLET | Freq: Two times a day (BID) | ORAL | 3 refills | Status: DC

## 2021-06-20 ENCOUNTER — Telehealth: Payer: Self-pay | Admitting: *Deleted

## 2021-06-20 DIAGNOSIS — Z86718 Personal history of other venous thrombosis and embolism: Secondary | ICD-10-CM

## 2021-06-20 DIAGNOSIS — Z86711 Personal history of pulmonary embolism: Secondary | ICD-10-CM

## 2021-06-20 NOTE — Telephone Encounter (Signed)
Patients wife aware

## 2021-06-20 NOTE — Telephone Encounter (Signed)
Fax received mdINR PT/INR self testing service Test date/time 06/19/21 601 pm INR 2.2

## 2021-06-20 NOTE — Telephone Encounter (Signed)
Description   Continue to take half a tablet of the 3 mg tablets every Monday and Friday and continue 3 mg tablets the rest of the week.  INR 2.2 (goal is 2-3),  Recheck in 1 to 2 weeks       Caryl Pina, MD [facility] 06/20/2021, 12:26 PM

## 2021-06-26 DIAGNOSIS — Z86718 Personal history of other venous thrombosis and embolism: Secondary | ICD-10-CM | POA: Diagnosis not present

## 2021-06-26 DIAGNOSIS — Z7901 Long term (current) use of anticoagulants: Secondary | ICD-10-CM | POA: Diagnosis not present

## 2021-06-27 ENCOUNTER — Telehealth: Payer: Self-pay | Admitting: *Deleted

## 2021-06-27 DIAGNOSIS — Z86711 Personal history of pulmonary embolism: Secondary | ICD-10-CM

## 2021-06-27 DIAGNOSIS — Z86718 Personal history of other venous thrombosis and embolism: Secondary | ICD-10-CM

## 2021-06-27 NOTE — Telephone Encounter (Signed)
Description   Continue to take half a tablet of the 3 mg tablets every Monday and Friday and continue 3 mg tablets the rest of the week.  INR 2.3 (goal is 2-3),  Recheck in 1 to 2 weeks       Caryl Pina, MD [facility] 06/27/2021, 9:01 AM

## 2021-06-27 NOTE — Telephone Encounter (Signed)
[name] called and aware -jhb 06/27/21.

## 2021-06-27 NOTE — Telephone Encounter (Signed)
Fax received mdINR PT/INR self testing service Test date/time 06/26/21 616 pm INR 2.3

## 2021-07-03 LAB — CUP PACEART REMOTE DEVICE CHECK
Battery Remaining Longevity: 30 mo
Battery Remaining Percentage: 29 %
Battery Voltage: 2.83 V
Brady Statistic RV Percent Paced: 1 %
Date Time Interrogation Session: 20220704040017
HighPow Impedance: 86 Ohm
HighPow Impedance: 86 Ohm
Implantable Lead Implant Date: 20140205
Implantable Lead Location: 753860
Implantable Lead Model: 181
Implantable Lead Serial Number: 323859
Implantable Pulse Generator Implant Date: 20140205
Lead Channel Impedance Value: 440 Ohm
Lead Channel Pacing Threshold Amplitude: 1 V
Lead Channel Pacing Threshold Pulse Width: 0.5 ms
Lead Channel Sensing Intrinsic Amplitude: 11.7 mV
Lead Channel Setting Pacing Amplitude: 2.5 V
Lead Channel Setting Pacing Pulse Width: 0.5 ms
Lead Channel Setting Sensing Sensitivity: 0.5 mV
Pulse Gen Serial Number: 7040910

## 2021-07-04 ENCOUNTER — Telehealth: Payer: Self-pay | Admitting: *Deleted

## 2021-07-04 ENCOUNTER — Ambulatory Visit (INDEPENDENT_AMBULATORY_CARE_PROVIDER_SITE_OTHER): Payer: Medicare HMO

## 2021-07-04 DIAGNOSIS — I428 Other cardiomyopathies: Secondary | ICD-10-CM

## 2021-07-04 DIAGNOSIS — Z86718 Personal history of other venous thrombosis and embolism: Secondary | ICD-10-CM

## 2021-07-04 DIAGNOSIS — J449 Chronic obstructive pulmonary disease, unspecified: Secondary | ICD-10-CM | POA: Diagnosis not present

## 2021-07-04 DIAGNOSIS — Z86711 Personal history of pulmonary embolism: Secondary | ICD-10-CM

## 2021-07-04 NOTE — Telephone Encounter (Signed)
Description   Slightly off but no change in dosing, continue to take half a tablet of the 3 mg tablets every Monday and Friday and continue 3 mg tablets the rest of the week.  INR 1.9 (goal is 2-3),  Recheck in 1 to 2 weeks

## 2021-07-04 NOTE — Telephone Encounter (Signed)
Lmtcb.

## 2021-07-04 NOTE — Telephone Encounter (Signed)
Pts wife returned missed call. Reviewed Dr Neldon Mc note with her. Wife voiced understanding and said she would update [place] in 1-2 wks with next INR.

## 2021-07-04 NOTE — Telephone Encounter (Signed)
Fax received mdINR PT/INR self testing service Test date/time 07/03/21 at [facility] INR 1.9  Call wife at [phone]

## 2021-07-11 ENCOUNTER — Telehealth: Payer: Self-pay | Admitting: *Deleted

## 2021-07-11 DIAGNOSIS — Z86711 Personal history of pulmonary embolism: Secondary | ICD-10-CM

## 2021-07-11 DIAGNOSIS — Z86718 Personal history of other venous thrombosis and embolism: Secondary | ICD-10-CM

## 2021-07-11 NOTE — Telephone Encounter (Signed)
[name] made aware and understood.

## 2021-07-11 NOTE — Telephone Encounter (Signed)
Fax received mdINR PT/INR self testing service Test date/time 07/10/21 612 pm INR 2.0

## 2021-07-11 NOTE — Telephone Encounter (Signed)
Description   continue to take half a tablet of the 3 mg tablets every Monday and Friday and continue 3 mg tablets the rest of the week.  INR 2.0 (goal is 2-3),  Recheck in 1 to 2 weeks       Caryl Pina, MD [facility] 07/11/2021, 1:12 PM

## 2021-07-18 ENCOUNTER — Telehealth: Payer: Self-pay | Admitting: *Deleted

## 2021-07-18 DIAGNOSIS — Z86711 Personal history of pulmonary embolism: Secondary | ICD-10-CM

## 2021-07-18 DIAGNOSIS — Z86718 Personal history of other venous thrombosis and embolism: Secondary | ICD-10-CM

## 2021-07-18 NOTE — Telephone Encounter (Signed)
Description   Only slightly off, continue to take half a tablet of the 3 mg tablets every Monday and Friday and continue 3 mg tablets the rest of the week.  INR 1.9 (goal is 2-3),  Recheck in 1 to 2 weeks       Caryl Pina, MD [facility] 07/18/2021, 8:52 AM

## 2021-07-18 NOTE — Telephone Encounter (Signed)
Called and aware

## 2021-07-18 NOTE — Telephone Encounter (Signed)
Fax received mdINR PT/INR self testing service Test date/time 7/20/2 640 pm INR 1.9

## 2021-07-24 DIAGNOSIS — Z86718 Personal history of other venous thrombosis and embolism: Secondary | ICD-10-CM | POA: Diagnosis not present

## 2021-07-24 DIAGNOSIS — Z7901 Long term (current) use of anticoagulants: Secondary | ICD-10-CM | POA: Diagnosis not present

## 2021-07-25 ENCOUNTER — Telehealth: Payer: Self-pay | Admitting: *Deleted

## 2021-07-25 DIAGNOSIS — Z86711 Personal history of pulmonary embolism: Secondary | ICD-10-CM

## 2021-07-25 DIAGNOSIS — Z86718 Personal history of other venous thrombosis and embolism: Secondary | ICD-10-CM

## 2021-07-25 NOTE — Telephone Encounter (Signed)
Fax received mdINR PT/INR self testing service Test date/time 07/24/2021 INR 2.1

## 2021-07-25 NOTE — Telephone Encounter (Signed)
Description   Continue to take half a tablet of the 3 mg tablets every Monday and Friday and continue 3 mg tablets the rest of the week.  INR 2.1 (goal is 2-3),  Recheck in 4 weeks

## 2021-07-25 NOTE — Telephone Encounter (Signed)
[name] made aware and understood.

## 2021-07-26 NOTE — Progress Notes (Signed)
Remote ICD transmission.   

## 2021-08-01 ENCOUNTER — Telehealth: Payer: Self-pay | Admitting: *Deleted

## 2021-08-01 DIAGNOSIS — Z86711 Personal history of pulmonary embolism: Secondary | ICD-10-CM

## 2021-08-01 DIAGNOSIS — Z86718 Personal history of other venous thrombosis and embolism: Secondary | ICD-10-CM

## 2021-08-01 NOTE — Telephone Encounter (Signed)
Fax received mdINR PT/INR self testing service Test date/time 07/31/21 622 pm INR 2.2

## 2021-08-01 NOTE — Telephone Encounter (Signed)
[name] informed and understood.

## 2021-08-01 NOTE — Telephone Encounter (Signed)
Description   Continue to take half a tablet of the 3 mg tablets every Monday and Friday and continue 3 mg tablets the rest of the week.  INR 2.2 (goal is 2-3),  Recheck in 4 weeks       Caryl Pina, MD [facility] 08/01/2021, 1:55 PM

## 2021-08-04 DIAGNOSIS — J449 Chronic obstructive pulmonary disease, unspecified: Secondary | ICD-10-CM | POA: Diagnosis not present

## 2021-08-07 MED ORDER — TRELEGY ELLIPTA 100-62.5-25 MCG/INH IN AEPB: 1.0000 | INHALATION_SPRAY | Freq: Every day | RESPIRATORY_TRACT | 11 refills | Status: DC

## 2021-08-08 ENCOUNTER — Telehealth: Payer: Self-pay | Admitting: *Deleted

## 2021-08-08 DIAGNOSIS — Z86718 Personal history of other venous thrombosis and embolism: Secondary | ICD-10-CM

## 2021-08-08 DIAGNOSIS — Z86711 Personal history of pulmonary embolism: Secondary | ICD-10-CM

## 2021-08-08 NOTE — Telephone Encounter (Signed)
Fax received mdINR PT/INR self testing service Test date/time 08/07/21 750 pm INR 2.2

## 2021-08-08 NOTE — Telephone Encounter (Signed)
[name] informed and understood.

## 2021-08-08 NOTE — Telephone Encounter (Signed)
Description   Continue to take half a tablet of the 3 mg tablets every Monday and Friday and continue 3 mg tablets the rest of the week.  INR 2.2 (goal is 2-3),  Recheck in 4 weeks       Caryl Pina, MD [facility] 08/08/2021, 3:15 PM

## 2021-08-14 DIAGNOSIS — G4733 Obstructive sleep apnea (adult) (pediatric): Secondary | ICD-10-CM | POA: Diagnosis not present

## 2021-08-15 ENCOUNTER — Telehealth: Payer: Self-pay | Admitting: *Deleted

## 2021-08-15 DIAGNOSIS — Z86718 Personal history of other venous thrombosis and embolism: Secondary | ICD-10-CM

## 2021-08-15 DIAGNOSIS — Z86711 Personal history of pulmonary embolism: Secondary | ICD-10-CM

## 2021-08-15 NOTE — Telephone Encounter (Signed)
Fax received mdINR PT/INR self testing service Test date/time 8/18/2 621 am INR 2.1

## 2021-08-15 NOTE — Telephone Encounter (Signed)
Description   Continue to take half a tablet of the 3 mg tablets every Monday and Friday and continue 3 mg tablets the rest of the week.  INR 2.1 (goal is 2-3),  Recheck in 4 weeks       Caryl Pina, MD [facility] 08/15/2021, 7:50 AM

## 2021-08-15 NOTE — Telephone Encounter (Signed)
Patient's wife aware of instruction

## 2021-08-21 DIAGNOSIS — Z86718 Personal history of other venous thrombosis and embolism: Secondary | ICD-10-CM | POA: Diagnosis not present

## 2021-08-21 DIAGNOSIS — Z7901 Long term (current) use of anticoagulants: Secondary | ICD-10-CM | POA: Diagnosis not present

## 2021-08-22 ENCOUNTER — Telehealth: Payer: Self-pay | Admitting: *Deleted

## 2021-08-22 DIAGNOSIS — Z86718 Personal history of other venous thrombosis and embolism: Secondary | ICD-10-CM

## 2021-08-22 DIAGNOSIS — Z86711 Personal history of pulmonary embolism: Secondary | ICD-10-CM

## 2021-08-22 NOTE — Telephone Encounter (Signed)
Description   Continue to take half a tablet of the 3 mg tablets every Monday and Friday and continue 3 mg tablets the rest of the week.  INR 2.1 (goal is 2-3),  Recheck in 4 weeks       Caryl Pina, MD [facility] 08/22/2021, 10:32 AM

## 2021-08-22 NOTE — Telephone Encounter (Signed)
Patients wife aware

## 2021-08-22 NOTE — Telephone Encounter (Signed)
Fax received mdINR PT/INR self testing service Test date/time 08/21/21 638 pm INR 2.1

## 2021-08-29 ENCOUNTER — Telehealth: Payer: Self-pay | Admitting: *Deleted

## 2021-08-29 DIAGNOSIS — Z86711 Personal history of pulmonary embolism: Secondary | ICD-10-CM

## 2021-08-29 DIAGNOSIS — Z86718 Personal history of other venous thrombosis and embolism: Secondary | ICD-10-CM

## 2021-08-29 NOTE — Telephone Encounter (Signed)
Description   Continue to take half a tablet of the 3 mg tablets every Monday and Friday and continue 3 mg tablets the rest of the week.  INR 2.2 (goal is 2-3),  Recheck in 4 weeks       Caryl Pina, MD [facility] 08/29/2021, 1:08 PM

## 2021-08-29 NOTE — Telephone Encounter (Signed)
Fax received mdINR PT/INR self testing service Test date/time 08/28/21 617 pm INR 2.2

## 2021-08-29 NOTE — Telephone Encounter (Signed)
Patients wife aware

## 2021-09-04 DIAGNOSIS — J449 Chronic obstructive pulmonary disease, unspecified: Secondary | ICD-10-CM | POA: Diagnosis not present

## 2021-09-05 ENCOUNTER — Telehealth: Payer: Self-pay | Admitting: *Deleted

## 2021-09-05 DIAGNOSIS — Z86711 Personal history of pulmonary embolism: Secondary | ICD-10-CM

## 2021-09-05 DIAGNOSIS — Z86718 Personal history of other venous thrombosis and embolism: Secondary | ICD-10-CM

## 2021-09-05 NOTE — Telephone Encounter (Signed)
Wife aware

## 2021-09-05 NOTE — Telephone Encounter (Signed)
Fax received mdINR PT/INR self testing service Test date/time 09/04/21 630 pm INR 2.1

## 2021-09-05 NOTE — Telephone Encounter (Signed)
Description   Continue to take half a tablet of the 3 mg tablets every Monday and Friday and continue 3 mg tablets the rest of the week.  INR 2.1 (goal is 2-3),  Recheck in 4 weeks       Caryl Pina, MD [facility] 09/05/2021, 8:09 AM

## 2021-09-12 ENCOUNTER — Telehealth: Payer: Self-pay

## 2021-09-12 DIAGNOSIS — Z86711 Personal history of pulmonary embolism: Secondary | ICD-10-CM

## 2021-09-12 DIAGNOSIS — Z86718 Personal history of other venous thrombosis and embolism: Secondary | ICD-10-CM

## 2021-09-12 NOTE — Telephone Encounter (Signed)
[name] informed and understood.

## 2021-09-12 NOTE — Telephone Encounter (Signed)
Description   Continue to take half a tablet of the 3 mg tablets every Monday and Friday and continue 3 mg tablets the rest of the week.  INR 2.9 (goal is 2-3),  Recheck in 4 weeks       Caryl Pina, MD [facility] 09/12/2021, 9:00 AM

## 2021-09-12 NOTE — Telephone Encounter (Signed)
Fax received mdINR PT/INR self testing service Test date/time [id number] 556 pm INR 2.9

## 2021-09-18 DIAGNOSIS — Z86718 Personal history of other venous thrombosis and embolism: Secondary | ICD-10-CM | POA: Diagnosis not present

## 2021-09-18 DIAGNOSIS — Z7901 Long term (current) use of anticoagulants: Secondary | ICD-10-CM | POA: Diagnosis not present

## 2021-09-19 ENCOUNTER — Telehealth: Payer: Self-pay | Admitting: *Deleted

## 2021-09-19 DIAGNOSIS — Z86711 Personal history of pulmonary embolism: Secondary | ICD-10-CM

## 2021-09-19 DIAGNOSIS — Z86718 Personal history of other venous thrombosis and embolism: Secondary | ICD-10-CM

## 2021-09-19 NOTE — Telephone Encounter (Signed)
Description   Take an extra 3 mg tablet today and then continue to take half a tablet of the 3 mg tablets every Monday and Friday and continue 3 mg tablets the rest of the week.  INR 1.4 (goal is 2-3),  Recheck in 1-2 weeks       Caryl Pina, MD [facility] 09/19/2021, 10:01 AM

## 2021-09-19 NOTE — Telephone Encounter (Signed)
Patient aware and verbalized understanding. °

## 2021-09-19 NOTE — Telephone Encounter (Signed)
Fax received mdINR PT/INR self testing service Test date/time 09/18/21 610pm INR 1.4

## 2021-09-26 ENCOUNTER — Telehealth: Payer: Self-pay | Admitting: *Deleted

## 2021-09-26 DIAGNOSIS — Z86711 Personal history of pulmonary embolism: Secondary | ICD-10-CM

## 2021-09-26 DIAGNOSIS — Z86718 Personal history of other venous thrombosis and embolism: Secondary | ICD-10-CM

## 2021-09-26 NOTE — Telephone Encounter (Signed)
Description   continue to take half a tablet of the 3 mg tablets every Monday and Friday and continue 3 mg tablets the rest of the week.  INR  2.9(goal is 2-3),  Recheck in 1-2 weeks       Caryl Pina, MD [facility] 09/26/2021, 7:47 AM

## 2021-09-26 NOTE — Telephone Encounter (Signed)
[name] informed and understood

## 2021-09-26 NOTE — Telephone Encounter (Signed)
Fax received mdINR PT/INR self testing service Test date/time 09/25/21 606 pm INR 2.9

## 2021-10-01 LAB — CUP PACEART REMOTE DEVICE CHECK
Battery Remaining Longevity: 25 mo
Battery Remaining Percentage: 24 %
Battery Voltage: 2.8 V
Brady Statistic RV Percent Paced: 1 %
Date Time Interrogation Session: 20221003040016
HighPow Impedance: 86 Ohm
HighPow Impedance: 86 Ohm
Implantable Lead Implant Date: 20140205
Implantable Lead Location: 753860
Implantable Lead Model: 181
Implantable Lead Serial Number: 323859
Implantable Pulse Generator Implant Date: 20140205
Lead Channel Impedance Value: 430 Ohm
Lead Channel Pacing Threshold Amplitude: 1 V
Lead Channel Pacing Threshold Pulse Width: 0.5 ms
Lead Channel Sensing Intrinsic Amplitude: 11.8 mV
Lead Channel Setting Pacing Amplitude: 2.5 V
Lead Channel Setting Pacing Pulse Width: 0.5 ms
Lead Channel Setting Sensing Sensitivity: 0.5 mV
Pulse Gen Serial Number: 7040910

## 2021-10-03 ENCOUNTER — Ambulatory Visit (INDEPENDENT_AMBULATORY_CARE_PROVIDER_SITE_OTHER): Payer: Medicare HMO

## 2021-10-03 ENCOUNTER — Telehealth: Payer: Self-pay | Admitting: *Deleted

## 2021-10-03 DIAGNOSIS — I255 Ischemic cardiomyopathy: Secondary | ICD-10-CM | POA: Diagnosis not present

## 2021-10-03 DIAGNOSIS — Z86718 Personal history of other venous thrombosis and embolism: Secondary | ICD-10-CM

## 2021-10-03 DIAGNOSIS — Z86711 Personal history of pulmonary embolism: Secondary | ICD-10-CM

## 2021-10-03 NOTE — Telephone Encounter (Signed)
LMOM with instructions for wife

## 2021-10-03 NOTE — Telephone Encounter (Signed)
Description   Take 1 extra pill today and then continue to take half a tablet of the 3 mg tablets every Monday and Friday and continue 3 mg tablets the rest of the week.  INR  1.9(goal is 2-3),  Recheck in 1-2 weeks        Caryl Pina, MD [facility] 10/03/2021, 11:30 AM

## 2021-10-03 NOTE — Telephone Encounter (Signed)
Fax received mdINR PT/INR self testing service Test date/time 10/02/21 658 pm INR 1.9

## 2021-10-04 DIAGNOSIS — J449 Chronic obstructive pulmonary disease, unspecified: Secondary | ICD-10-CM | POA: Diagnosis not present

## 2021-10-07 ENCOUNTER — Encounter: Payer: Self-pay | Admitting: Family Medicine

## 2021-10-07 DIAGNOSIS — E785 Hyperlipidemia, unspecified: Secondary | ICD-10-CM

## 2021-10-07 MED ORDER — ATORVASTATIN CALCIUM 40 MG PO TABS: 40.0000 mg | ORAL_TABLET | ORAL | 2 refills | Status: DC

## 2021-10-07 NOTE — Telephone Encounter (Signed)
Dr. Warrick Parisian,  Are you ok refilling Lipitor. You are not the prescriber listed. Pt has not had labs or an OV since May. Pt does have a follow up scheduled 11/4

## 2021-10-10 ENCOUNTER — Telehealth: Payer: Self-pay | Admitting: *Deleted

## 2021-10-10 DIAGNOSIS — Z86718 Personal history of other venous thrombosis and embolism: Secondary | ICD-10-CM

## 2021-10-10 DIAGNOSIS — Z86711 Personal history of pulmonary embolism: Secondary | ICD-10-CM

## 2021-10-10 NOTE — Telephone Encounter (Signed)
Fax received mdINR PT/INR self testing service Test date/time 10/09/21 637 pm INR 2.4

## 2021-10-10 NOTE — Telephone Encounter (Signed)
Patient aware and verbalized understanding. °

## 2021-10-10 NOTE — Telephone Encounter (Signed)
Description   continue to take half a tablet of the 3 mg tablets every Monday and Friday and continue 3 mg tablets the rest of the week.  INR  2.4(goal is 2-3),  Recheck in 1-2 weeks       Caryl Pina, MD [facility] 10/10/2021, 12:57 PM

## 2021-10-13 ENCOUNTER — Other Ambulatory Visit: Payer: Self-pay | Admitting: Family Medicine

## 2021-10-13 DIAGNOSIS — I5032 Chronic diastolic (congestive) heart failure: Secondary | ICD-10-CM

## 2021-10-13 DIAGNOSIS — I10 Essential (primary) hypertension: Secondary | ICD-10-CM

## 2021-10-14 NOTE — Progress Notes (Signed)
Remote ICD transmission.   

## 2021-10-16 DIAGNOSIS — Z7901 Long term (current) use of anticoagulants: Secondary | ICD-10-CM | POA: Diagnosis not present

## 2021-10-16 DIAGNOSIS — Z86718 Personal history of other venous thrombosis and embolism: Secondary | ICD-10-CM | POA: Diagnosis not present

## 2021-10-17 ENCOUNTER — Telehealth: Payer: Self-pay | Admitting: *Deleted

## 2021-10-17 DIAGNOSIS — Z86718 Personal history of other venous thrombosis and embolism: Secondary | ICD-10-CM

## 2021-10-17 DIAGNOSIS — Z86711 Personal history of pulmonary embolism: Secondary | ICD-10-CM

## 2021-10-17 NOTE — Telephone Encounter (Signed)
Description   Hold today's dose and then continue to take half a tablet of the 3 mg tablets every Monday and Friday and continue 3 mg tablets the rest of the week.  INR  3.2(goal is 2-3),  Recheck in 1-2 weeks       Caryl Pina, MD [facility] 10/17/2021, 12:58 PM

## 2021-10-17 NOTE — Telephone Encounter (Signed)
Fax received mdINR PT/INR self testing service Test date/time 10/16/21 550p  INR 3.2

## 2021-10-18 NOTE — Telephone Encounter (Signed)
[name] has been informed and understood instructions.

## 2021-10-18 NOTE — Telephone Encounter (Signed)
Just tell him to hold for 1 day, but that is today then have him hold today, if he already took today's and have him hold tomorrow's.  Just hold for 1 day and then go back to the schedule that I have on there.

## 2021-10-18 NOTE — Telephone Encounter (Signed)
We did not get to call on time. What should we tell him today?

## 2021-10-24 ENCOUNTER — Other Ambulatory Visit: Payer: Self-pay | Admitting: Family Medicine

## 2021-10-24 ENCOUNTER — Telehealth: Payer: Self-pay | Admitting: *Deleted

## 2021-10-24 DIAGNOSIS — Z86711 Personal history of pulmonary embolism: Secondary | ICD-10-CM

## 2021-10-24 DIAGNOSIS — Z86718 Personal history of other venous thrombosis and embolism: Secondary | ICD-10-CM

## 2021-10-24 DIAGNOSIS — F01518 Vascular dementia, unspecified severity, with other behavioral disturbance: Secondary | ICD-10-CM

## 2021-10-24 NOTE — Telephone Encounter (Signed)
Description   continue to take half a tablet of the 3 mg tablets every Monday and Friday and continue 3 mg tablets the rest of the week.  INR  2.2(goal is 2-3),  Recheck in 1-2 weeks        Caryl Pina, MD [facility] 10/24/2021, 10:34 AM

## 2021-10-24 NOTE — Telephone Encounter (Signed)
Fax received mdINR PT/INR self testing service Test date/time 10/23/21 646 pm INR 2.2

## 2021-10-24 NOTE — Telephone Encounter (Signed)
Patient aware and verbalized understanding. °

## 2021-10-30 ENCOUNTER — Other Ambulatory Visit: Payer: Self-pay | Admitting: Urology

## 2021-10-30 DIAGNOSIS — C3431 Malignant neoplasm of lower lobe, right bronchus or lung: Secondary | ICD-10-CM

## 2021-11-01 ENCOUNTER — Ambulatory Visit (INDEPENDENT_AMBULATORY_CARE_PROVIDER_SITE_OTHER): Payer: Medicare HMO | Admitting: Family Medicine

## 2021-11-01 ENCOUNTER — Telehealth: Payer: Self-pay | Admitting: *Deleted

## 2021-11-01 ENCOUNTER — Other Ambulatory Visit: Payer: Self-pay

## 2021-11-01 ENCOUNTER — Encounter: Payer: Self-pay | Admitting: Family Medicine

## 2021-11-01 VITALS — BP 124/81 | HR 88 | Ht 69.0 in | Wt 221.0 lb

## 2021-11-01 DIAGNOSIS — Z23 Encounter for immunization: Secondary | ICD-10-CM

## 2021-11-01 DIAGNOSIS — R69 Illness, unspecified: Secondary | ICD-10-CM | POA: Diagnosis not present

## 2021-11-01 DIAGNOSIS — I10 Essential (primary) hypertension: Secondary | ICD-10-CM | POA: Diagnosis not present

## 2021-11-01 DIAGNOSIS — Z86718 Personal history of other venous thrombosis and embolism: Secondary | ICD-10-CM | POA: Diagnosis not present

## 2021-11-01 DIAGNOSIS — Z86711 Personal history of pulmonary embolism: Secondary | ICD-10-CM

## 2021-11-01 DIAGNOSIS — R531 Weakness: Secondary | ICD-10-CM

## 2021-11-01 DIAGNOSIS — I5042 Chronic combined systolic (congestive) and diastolic (congestive) heart failure: Secondary | ICD-10-CM

## 2021-11-01 DIAGNOSIS — F03918 Unspecified dementia, unspecified severity, with other behavioral disturbance: Secondary | ICD-10-CM | POA: Diagnosis not present

## 2021-11-01 DIAGNOSIS — E782 Mixed hyperlipidemia: Secondary | ICD-10-CM | POA: Diagnosis not present

## 2021-11-01 DIAGNOSIS — J449 Chronic obstructive pulmonary disease, unspecified: Secondary | ICD-10-CM | POA: Diagnosis not present

## 2021-11-01 NOTE — Telephone Encounter (Signed)
Fax received mdINR PT/INR self testing service Test date/time 10/31/21 609 pm INR 2.8

## 2021-11-01 NOTE — Progress Notes (Signed)
BP 124/81   Pulse 88   Ht '5\' 9"'  (1.753 m)   Wt 221 lb (100.2 kg)   SpO2 96%   BMI 32.64 kg/m    Subjective:   Patient ID: Travis Cruz, male    DOB: 1946/08/31, 75 y.o.   MRN: [id number]  HPI: Travis Cruz is a 75 y.o. male presenting on 11/01/2021 for Medical Management of Chronic Issues, Hypertension, and chronic anticoagulation   HPI Hypertension and CHF Patient is currently on furosemide and metoprolol, and their blood pressure today is 124/81. Patient denies any lightheadedness or dizziness. Patient denies headaches, blurred vision, chest pains, shortness of breath, or weakness. Denies any side effects from medication and is content with current medication.   Hyperlipidemia Patient is coming in for recheck of his hyperlipidemia. The patient is currently taking atorvastatin. They deny any issues with myalgias or history of liver damage from it. They deny any focal numbness or weakness or chest pain.   COPD Patient is coming in for COPD recheck today.  He is currently on albuterol and Trelegy and oxygen 3 L and furosemide.  He has a mild chronic cough but denies any major coughing spells or wheezing spells.  He has 7nighttime symptoms per week and 7daytime symptoms per week currently.  Wife says he is stable he gets a little more short of breath on exertion.  Dementia with behavioral disturbance. Memory slightly worsening per wife but his mood has been stable and he seems to be happy.  Wife's biggest concern is that she is having more more difficulty lifting him and they do have a lift belt but they have trouble getting them up and off the toilet and out of some chairs and sometimes out of bed.  She is wondering if there is something that can help lift him.  They do have a nurse that says a stand-up lift might help.  He has become increasingly deconditioned and weak in his legs and has difficulty even using his walker at times.  He has more difficulty following  instructions as well because of his memory.  Relevant past medical, surgical, family and social history reviewed and updated as indicated. Interim medical history since our last visit reviewed. Allergies and medications reviewed and updated.  Review of Systems  Constitutional:  Negative for chills and fever.  Eyes:  Negative for visual disturbance.  Respiratory:  Positive for shortness of breath. Negative for cough and wheezing.   Cardiovascular:  Negative for chest pain and leg swelling.  Musculoskeletal:  Positive for gait problem. Negative for back pain.  Skin:  Negative for rash.  Neurological:  Positive for weakness. Negative for dizziness and light-headedness.  Psychiatric/Behavioral:  Positive for confusion.   All other systems reviewed and are negative.  Per HPI unless specifically indicated above   Allergies as of 11/01/2021       Reactions   Ativan [lorazepam] Anxiety, Other (See Comments)   Hyper  [name]   Cefuroxime Axetil Rash   Zinacef [cefuroxime] Rash        Medication List        Accurate as of November 01, 2021  8:35 AM. If you have any questions, ask your nurse or doctor.          acetaminophen 325 MG tablet Commonly known as: TYLENOL Take 2 tablets (650 mg total) by mouth every 6 (six) hours as needed for mild pain, fever or headache (or Fever >/=  101).   albuterol 108 (90 Base) MCG/ACT inhaler Commonly known as: VENTOLIN HFA Inhale 2 puffs into the lungs every 4 (four) hours as needed for wheezing or shortness of breath.   ascorbic acid 500 MG tablet Commonly known as: VITAMIN C Take 1 tablet (500 mg total) by mouth daily.   atorvastatin 40 MG tablet Commonly known as: LIPITOR Take 1 tablet (40 mg total) by mouth every Monday, Wednesday, and Friday.   azithromycin 250 MG tablet Commonly known as: ZITHROMAX Take 1 tablet (250 mg total) by mouth 3 (three) times a week. Monday Wednesday Friday.    finasteride 5 MG tablet Commonly known as: PROSCAR Take 1 tablet (5 mg total) by mouth daily.   furosemide 20 MG tablet Commonly known as: LASIX Take 1 tablet (20 mg total) by mouth daily.   metoprolol tartrate 25 MG tablet Commonly known as: LOPRESSOR TAKE 1.5 TABLETS (37.5 MG TOTAL) BY MOUTH 2 (TWO) TIMES DAILY.   multivitamin with minerals Tabs tablet Take 1 tablet by mouth daily.   OXYGEN Inhale 3 L/L into the lungs.   PRESERVISION AREDS PO Take 1 capsule by mouth 2 (two) times daily.   sertraline 100 MG tablet Commonly known as: ZOLOFT TAKE 1 TABLET BY MOUTH EVERY DAY   tamsulosin 0.4 MG Caps capsule Commonly known as: FLOMAX Take 1 capsule (0.4 mg total) by mouth daily after supper. (Needs to be seen before next refill)   Trelegy Ellipta 100-62.5-25 MCG/ACT Aepb Generic drug: Fluticasone-Umeclidin-Vilant Inhale 1 puff into the lungs daily.   Vitamin D3 50 MCG (2000 UT) Tabs Take 2,000 Units by mouth daily.   warfarin 2 MG tablet Commonly known as: COUMADIN Take as directed by the anticoagulation clinic. If you are unsure how to take this medication, talk to your nurse or doctor. Original instructions: Take 1 tablet (2 mg total) by mouth every evening. What changed: how much to take               Durable Medical Equipment  (From admission, onward)           Start     Ordered   11/01/21 0000  For home use only DME Other see comment       Comments: Sit to stand lift Diagnosis weakness and difficulty with get up and go  Question:  Length of Need  Answer:  Lifetime   11/01/21 0835             Objective:   BP 124/81   Pulse 88   Ht '5\' 9"'  (1.753 m)   Wt 221 lb (100.2 kg)   SpO2 96%   BMI 32.64 kg/m   Wt Readings from Last 3 Encounters:  11/01/21 221 lb (100.2 kg)  05/08/21 225 lb (102.1 kg)  05/01/21 221 lb (100.2 kg)    Physical Exam Vitals and nursing note reviewed.  Constitutional:      General: He is not in acute distress.     Appearance: He is well-developed. He is not diaphoretic.  Eyes:     General: No scleral icterus.    Conjunctiva/sclera: Conjunctivae normal.  Neck:     Thyroid: No thyromegaly.  Cardiovascular:     Rate and Rhythm: Normal rate and regular rhythm.     Heart sounds: Normal heart sounds. No murmur heard. Pulmonary:     Effort: Pulmonary effort is normal. No respiratory distress.     Breath sounds: Rhonchi (small amount of rhonchi throughout, at baseline for him) present. No  wheezing.  Musculoskeletal:        General: Swelling present. Normal range of motion.     Cervical back: Neck supple.  Lymphadenopathy:     Cervical: No cervical adenopathy.  Skin:    General: Skin is warm and dry.     Findings: No rash.  Neurological:     Mental Status: He is alert. Mental status is at baseline. He is disoriented.     Gait: Gait abnormal (Mostly wheelchair-bound, difficulty walking even with a walker.).  Psychiatric:        Behavior: Behavior normal.    Description   continue to take half a tablet of the 3 mg tablets every Monday and Friday and continue 3 mg tablets the rest of the week.  INR  2.8(goal is 2-3),  Recheck in 1-2 weeks, checks at home         Assessment & Plan:   Problem List Items Addressed This Visit       Cardiovascular and Mediastinum   Hypertension   Relevant Orders   CBC with Differential/Platelet   CMP14+EGFR   Lipid panel   Systolic and diastolic CHF, chronic (HCC)   Relevant Orders   CBC with Differential/Platelet   CMP14+EGFR   Lipid panel   For home use only DME Other see comment     Respiratory   COPD (chronic obstructive pulmonary disease) (HCC)     Nervous and Auditory   Dementia with behavioral disturbance   Relevant Orders   For home use only DME Other see comment     Other   History of DVT (deep vein thrombosis)   Relevant Orders   CBC with Differential/Platelet   CMP14+EGFR   Lipid panel   History of pulmonary embolus (PE)    Relevant Orders   CBC with Differential/Platelet   CMP14+EGFR   Lipid panel   Hyperlipidemia - Primary   Relevant Orders   Lipid panel   Other Visit Diagnoses     Weakness       Relevant Orders   For home use only DME Other see comment       Patient has increased weakness especially getting up and out of the toilet and chairs and has been an increased fall risk and has even more difficulty using a walker.  Family would like a lift.  We will do prescription for it. Follow up plan: Return in about 6 months (around 05/01/2022), or if symptoms worsen or fail to improve, for Continue current medicine, seems to be stable..  Counseling provided for all of the vaccine components Orders Placed This Encounter  Procedures   For home use only DME Other see comment   CBC with Differential/Platelet   CMP14+EGFR   Lipid panel    Caryl Pina, MD [facility] 11/01/2021, 8:35 AM

## 2021-11-04 DIAGNOSIS — J449 Chronic obstructive pulmonary disease, unspecified: Secondary | ICD-10-CM | POA: Diagnosis not present

## 2021-11-05 ENCOUNTER — Ambulatory Visit: Payer: Medicare HMO | Admitting: Pulmonary Disease

## 2021-11-05 ENCOUNTER — Encounter: Payer: Self-pay | Admitting: Pulmonary Disease

## 2021-11-05 ENCOUNTER — Other Ambulatory Visit: Payer: Self-pay

## 2021-11-05 VITALS — BP 118/78 | HR 80 | Temp 97.8°F | Ht 69.0 in | Wt 230.0 lb

## 2021-11-05 DIAGNOSIS — J9611 Chronic respiratory failure with hypoxia: Secondary | ICD-10-CM | POA: Diagnosis not present

## 2021-11-05 DIAGNOSIS — J449 Chronic obstructive pulmonary disease, unspecified: Secondary | ICD-10-CM | POA: Diagnosis not present

## 2021-11-05 NOTE — Patient Instructions (Addendum)
Nice to see you again  Continue Trelegy and Azithromycin as prescribed.  I recommend the new Covid booster - make sure it is the bivalent vaccine.  Return to clinic in 6 months or sooner as needed
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
## 2021-11-07 ENCOUNTER — Telehealth: Payer: Self-pay | Admitting: *Deleted

## 2021-11-07 DIAGNOSIS — Z86718 Personal history of other venous thrombosis and embolism: Secondary | ICD-10-CM

## 2021-11-07 DIAGNOSIS — Z86711 Personal history of pulmonary embolism: Secondary | ICD-10-CM

## 2021-11-07 NOTE — Telephone Encounter (Signed)
Wife aware

## 2021-11-07 NOTE — Telephone Encounter (Signed)
Description   continue to take half a tablet of the 3 mg tablets every Monday and Friday and continue 3 mg tablets the rest of the week.  INR  2.5(goal is 2-3),  Recheck in 1-2 weeks, checks at home       Caryl Pina, MD [place] 11/07/2021, 1:05 PM

## 2021-11-07 NOTE — Telephone Encounter (Signed)
Fax received mdINR PT/INR self testing service Test date/time 11/06/21 627 pm INR 2.5

## 2021-11-12 ENCOUNTER — Encounter: Payer: Self-pay | Admitting: Family Medicine

## 2021-11-13 DIAGNOSIS — Z7901 Long term (current) use of anticoagulants: Secondary | ICD-10-CM | POA: Diagnosis not present

## 2021-11-13 DIAGNOSIS — Z86718 Personal history of other venous thrombosis and embolism: Secondary | ICD-10-CM | POA: Diagnosis not present

## 2021-11-14 ENCOUNTER — Telehealth: Payer: Self-pay | Admitting: *Deleted

## 2021-11-14 DIAGNOSIS — Z86711 Personal history of pulmonary embolism: Secondary | ICD-10-CM

## 2021-11-14 DIAGNOSIS — Z86718 Personal history of other venous thrombosis and embolism: Secondary | ICD-10-CM

## 2021-11-14 NOTE — Telephone Encounter (Signed)
  Description   continue to take half a tablet of the 3 mg tablets every Monday and Friday and continue 3 mg tablets the rest of the week.  INR  2.2(goal is 2-3),  Recheck in 1-2 weeks, checks at home        Caryl Pina, MD [place] 11/14/2021, 1:26 PM

## 2021-11-14 NOTE — Telephone Encounter (Signed)
Called and aware

## 2021-11-14 NOTE — Telephone Encounter (Signed)
Fax received mdINR PT/INR self testing service Test date/time 11/13/21 635 pm INR 2.2

## 2021-11-15 DIAGNOSIS — G4733 Obstructive sleep apnea (adult) (pediatric): Secondary | ICD-10-CM | POA: Diagnosis not present

## 2021-11-19 ENCOUNTER — Telehealth: Payer: Self-pay | Admitting: *Deleted

## 2021-11-19 NOTE — Telephone Encounter (Signed)
RETURNED PATIENT'S WIFE'S PHONE CALL, SPOKE WITH [name] Quezada

## 2021-11-20 ENCOUNTER — Ambulatory Visit (HOSPITAL_COMMUNITY)
Admission: RE | Admit: 2021-11-20 | Discharge: 2021-11-20 | Disposition: A | Discharge status: Home / Self Care | Payer: Medicare HMO | Source: Ambulatory Visit | Attending: Urology | Admitting: Urology

## 2021-11-20 ENCOUNTER — Encounter (HOSPITAL_COMMUNITY): Payer: Self-pay | Admitting: Radiology

## 2021-11-20 ENCOUNTER — Other Ambulatory Visit: Payer: Self-pay

## 2021-11-20 DIAGNOSIS — C3490 Malignant neoplasm of unspecified part of unspecified bronchus or lung: Secondary | ICD-10-CM | POA: Diagnosis not present

## 2021-11-20 DIAGNOSIS — I7121 Aneurysm of the ascending aorta, without rupture: Secondary | ICD-10-CM | POA: Diagnosis not present

## 2021-11-20 DIAGNOSIS — J439 Emphysema, unspecified: Secondary | ICD-10-CM | POA: Diagnosis not present

## 2021-11-20 DIAGNOSIS — I700 Atherosclerosis of aorta: Secondary | ICD-10-CM | POA: Diagnosis not present

## 2021-11-20 DIAGNOSIS — C3431 Malignant neoplasm of lower lobe, right bronchus or lung: Secondary | ICD-10-CM | POA: Insufficient documentation

## 2021-11-20 DIAGNOSIS — I712 Thoracic aortic aneurysm, without rupture, unspecified: Secondary | ICD-10-CM | POA: Diagnosis not present

## 2021-11-20 LAB — POCT I-STAT CREATININE: Creatinine, Ser: 1 mg/dL (ref 0.61–1.24)

## 2021-11-20 MED ORDER — IOHEXOL 300 MG/ML  SOLN
100.0000 mL | Freq: Once | INTRAMUSCULAR | Status: AC | PRN
  Administered 2021-11-20: 75 mL via INTRAVENOUS

## 2021-11-25 ENCOUNTER — Telehealth: Payer: Self-pay | Admitting: *Deleted

## 2021-11-25 DIAGNOSIS — Z86711 Personal history of pulmonary embolism: Secondary | ICD-10-CM

## 2021-11-25 DIAGNOSIS — Z86718 Personal history of other venous thrombosis and embolism: Secondary | ICD-10-CM

## 2021-11-25 NOTE — Telephone Encounter (Signed)
Fax received mdINR PT/INR self testing service Test date/time 11/21/21 653 am INR 2.6

## 2021-11-25 NOTE — Telephone Encounter (Signed)
Description   continue to take half a tablet of the 3 mg tablets every Monday and Friday and continue 3 mg tablets the rest of the week.  INR  2.6(goal is 2-3),  Recheck in 1-2 weeks, checks at home       Caryl Pina, MD [place] 11/25/2021, 8:45 AM

## 2021-11-25 NOTE — Telephone Encounter (Signed)
Wife aware

## 2021-11-26 ENCOUNTER — Encounter: Payer: Self-pay | Admitting: Urology

## 2021-11-26 ENCOUNTER — Ambulatory Visit
Admission: RE | Admit: 2021-11-26 | Discharge: 2021-11-26 | Disposition: A | Discharge status: Home / Self Care | Payer: Medicare HMO | Source: Ambulatory Visit | Attending: Urology | Admitting: Urology

## 2021-11-26 DIAGNOSIS — C3431 Malignant neoplasm of lower lobe, right bronchus or lung: Secondary | ICD-10-CM

## 2021-11-26 DIAGNOSIS — C3411 Malignant neoplasm of upper lobe, right bronchus or lung: Secondary | ICD-10-CM

## 2021-11-26 DIAGNOSIS — C3412 Malignant neoplasm of upper lobe, left bronchus or lung: Secondary | ICD-10-CM | POA: Diagnosis not present

## 2021-11-26 DIAGNOSIS — Z08 Encounter for follow-up examination after completed treatment for malignant neoplasm: Secondary | ICD-10-CM | POA: Diagnosis not present

## 2021-11-26 NOTE — Progress Notes (Signed)
Spoke w/ patient's spouse Mrs. Marguerite Olea, who is cleared to speak on behalf of MR. Waddington and was identified using 2 identifiers. Mrs. Trumbull reports that patient is doing well. No symptoms reported at this time.  Meaningful use complete.  Mrs. Wynonia Lawman notified of Mr. Banbury's 1:00pm-11/26/21 telephone appointment and verbalized understanding.  Patient preferred contact # [phone]

## 2021-11-26 NOTE — Progress Notes (Signed)
Radiation Oncology         [phone]) [phone] ________________________________  Name: Travis Cruz MRN: [id number]  Date: 11/26/2021  DOB: 03-16-1946  Outpatient Follow up Visit  CC: Dettinger, Fransisca Kaufmann, MD  Grace Isaac, MD  Diagnosis:  Putative metachronous stage IA NSCLC of the right upper lobe lung in patient with a history of left upper lobe NSCLC, squamous cell carcinoma, and putative NSCLC of the right lower lung - Clinical stage IA     Interval Since Last Radiation:  2 years s/p SBRT to the RUL  10/10/19, 10/14/19, 10/17/19//SBRT:  The RUL lung target was treated to 54 Gy in 3 fractions of 18 Gy  03/23/2017 to 04/02/2017:  The RLL target was treated to 50 Gy in 5 fractions of 10 Gy    11/07/13-11/16/13:  Left upper lobe / 60 Gy in 5 fractions at 12 Gray per fraction  Narrative:  I spoke with the patient's wife, Travis Cruz, due to his significant, progressive dementia, to conduct his routine 6 month follow up visit to review results from recent CT Chest scan performed on 11/20/21 via telephone to spare the patient unnecessary potential exposure in the healthcare setting during the current COVID-19 pandemic.  The patient was notified in advance and gave permission to proceed with this visit format.  He was hospitalized in 02/2020 and again in 11/2020 for acute hypoxic respiratory failure secondary to acute exacerbation of COPD and Covid in 11/2020. He was treated with multiple rounds of antibiotics and steroids which his wife feels has made an improvement in his breathing. He is followed with [facility], c/o Dr. Silas Flood and previously with Dr. Noemi Chapel.  His most recent visit with Dr. Silas Flood was 11/05/21 and his next follow up will be in 4-6 months.  Since we saw him last in 04/2021, he has remained clinically stable.   His recent CT Chest showed an overall stable appearance of the lungs with chronic postradiation changes of mass-like fibrosis in the lungs bilaterally,  similar to the most recent prior examination and no definitive findings to suggest recurrent or metastatic disease in the thorax. This was discussed with his wife, Travis Cruz, today.  On review of systems, obtained from wife, Travis Cruz, due to patient's dementia, the patient states that he is doing well and is currently without complaints.  He specifically denies dysphagia, chest pain, productive cough, hemoptysis, fever, chills, N/V.  He reports a decent appetite and is working on maintaining his weight.  His energy level is low but stable.  He continues on 3-4 L of oxygen via nasal cannula 24/7.  He denies fever, chills or night sweats.  ALLERGIES:  is allergic to ativan [lorazepam], cefuroxime axetil, and zinacef [cefuroxime].  Meds: Current Outpatient Medications  Medication Sig Dispense Refill   acetaminophen (TYLENOL) 325 MG tablet Take 2 tablets (650 mg total) by mouth every 6 (six) hours as needed for mild pain, fever or headache (or Fever >/= 101). 12 tablet 9   albuterol (VENTOLIN HFA) 108 (90 Base) MCG/ACT inhaler Inhale 2 puffs into the lungs every 4 (four) hours as needed for wheezing or shortness of breath. 18 g 11   ascorbic acid (VITAMIN C) 500 MG tablet Take 1 tablet (500 mg total) by mouth daily. 30 tablet 0   atorvastatin (LIPITOR) 40 MG tablet Take 1 tablet (40 mg total) by mouth every Monday, Wednesday, and Friday. 15 tablet 2   azithromycin (ZITHROMAX) 250 MG tablet Take 1 tablet (250 mg total) by mouth  3 (three) times a week. Monday Wednesday Friday. 30 tablet 4   Cholecalciferol (VITAMIN D3) 2000 units TABS Take 2,000 Units by mouth daily.      finasteride (PROSCAR) 5 MG tablet Take 1 tablet (5 mg total) by mouth daily. 90 tablet 3   Fluticasone-Umeclidin-Vilant (TRELEGY ELLIPTA) 100-62.5-25 MCG/INH AEPB Inhale 1 puff into the lungs daily. 60 each 11   furosemide (LASIX) 20 MG tablet Take 1 tablet (20 mg total) by mouth daily. 30 tablet 2   metoprolol tartrate (LOPRESSOR) 25 MG tablet  TAKE 1.5 TABLETS (37.5 MG TOTAL) BY MOUTH 2 (TWO) TIMES DAILY. 270 tablet 0   Multiple Vitamin (MULTIVITAMIN WITH MINERALS) TABS tablet Take 1 tablet by mouth daily.     Multiple Vitamins-Minerals (PRESERVISION AREDS PO) Take 1 capsule by mouth 2 (two) times daily.     OXYGEN Inhale 3 L/L into the lungs.     sertraline (ZOLOFT) 100 MG tablet TAKE 1 TABLET BY MOUTH EVERY DAY 90 tablet 0   tamsulosin (FLOMAX) 0.4 MG CAPS capsule Take 1 capsule (0.4 mg total) by mouth daily after supper. (Needs to be seen before next refill) 90 capsule 3   warfarin (COUMADIN) 2 MG tablet Take 1 tablet (2 mg total) by mouth every evening. (Patient taking differently: Take 3 mg by mouth every evening.) 30 tablet 2   No current facility-administered medications for this encounter.    Physical Findings:  vitals were not taken for this visit.  Pain Assessment Pain Score: 0-No pain/Unable to assess due to telephone follow-up visit format.  Lab Findings: Lab Results  Component Value Date   WBC 15.7 (H) 02/01/2021   HGB 15.9 02/01/2021   HCT 47.7 02/01/2021   MCV 87 02/01/2021   PLT 318 02/01/2021     Radiographic Findings: CT Chest W Contrast  Result Date: 11/21/2021 CLINICAL DATA:  History of small cell lung cancer restaging. Follow-up right upper lobe and left upper lobe non-small cell lung cancer and squamous cell post SBRT. EXAM: CT CHEST WITH CONTRAST TECHNIQUE: Multidetector CT imaging of the chest was performed during intravenous contrast administration. CONTRAST:  33mL OMNIPAQUE IOHEXOL 300 MG/ML  SOLN COMPARISON:  05/01/2021 FINDINGS: Cardiovascular: Borderline cardiomegaly. Cardiac pacer leads are present. Calcified plaque over the left main and 3 vessel coronary arteries. Stable ascending thoracic aortic aneurysm measuring 4 cm in AP diameter. Mild calcified plaque over the descending thoracic aorta. Pulmonary arterial system is unremarkable. Remaining vascular structures are unremarkable.  Mediastinum/Nodes: No mediastinal or hilar adenopathy. Remaining mediastinal structures are unremarkable. Lungs/Pleura: Lungs are adequately inflated and demonstrate mild-to-moderate emphysematous disease over the upper lungs. Stable linear density over the lingula likely due in part to radiation fibrosis/scarring. There is a new 1 cm subpleural nodular component (image 51) to this linear density likely atelectasis/fibrosis. Previously seen nodular area of presumed fibrosis over the posterior right lower lobe has resolved. Continued opacification over the posterior right upper lobe and lower lobes likely due to post radiation fibrosis. Decreased AP diameter of the trachea and central airways unchanged. Upper Abdomen: Mild calcified plaque over the abdominal aorta which is normal caliber. Minimal cholelithiasis. Spleen not visualized. Musculoskeletal: Couple small midline cystic lesions abutting the skin over the anterior chest wall likely dermatologic in nature such as sebaceous cyst and unchanged. Severe compression fracture over the mid to upper thoracic spine unchanged. Couple other mild to moderate compression fractures over the lower thoracic spine unchanged. IMPRESSION: 1. Bilateral pulmonary changes compatible with post radiation fibrosis. New 1 cm subpleural  nodular component to lingular fibrotic change likely nodular fibrosis or atelectasis. Previously seen nodular area of presumed fibrosis over the posterior right lower lobe has resolved. No adenopathy. No evidence of metastatic disease. 2. Emphysema. 3. Stable 4 cm ascending thoracic aortic aneurysm. Atherosclerotic coronary artery disease. Borderline cardiomegaly. Recommend annual imaging followup by CTA or MRA. This recommendation follows 2010 ACCF/AHA/AATS/ACR/ASA/SCA/SCAI/SIR/STS/SVM Guidelines for the Diagnosis and Management of Patients with Thoracic Aortic Disease. Circulation. 2010; 121: Z610-R604. Aortic aneurysm NOS (ICD10-I71.9). 4.  Cholelithiasis. 5. Stable compression fractures over the thoracic spine. 6. Couple small midline cystic lesions abutting the skin over the anterior chest wall likely dermatologic in nature such as sebaceous cyst and unchanged. Aortic Atherosclerosis (ICD10-I70.0) and Emphysema (ICD10-J43.9). Electronically Signed   By: Marin Olp M.D.   On: 11/21/2021 14:37    Impression/Plan: 1. 75 y/o male with putative stage IA NSCLC in the right upper lobe lung and a history of left upper lobe NSCLC, squamous cell carcinoma, and putative NSCLC of the right lower lung - Clinical stage IA. He has recovered well from the effects of his recent radiotherapy and is currently without complaints aside from his chronic shortness of breath with exertion related to his COPD.  His recent CT chest shows overall stable appearance with no evidence of new or progressive lesions or lymphadenopathy.  Therefore, we will continue with serial CT chest scans every 6 months at [facility] for convenience, with a follow-up visit or phone call thereafter to review results and recommendations.  They are comfortable with and in agreement with this plan and know to call at any time with any questions or concerns in the interim. I will forward a copy of my note today and recent scan results to Dr. Warrick Parisian, Dr. Silas Flood and Dr. Caryl Comes for their records as well.    Given current concerns for patient exposure during the COVID-19 pandemic, this encounter was conducted via telephone. The patient and his wife, Travis Cruz, were notified in advance and offered a [place] meeting to allow for face to face communication but unfortunately reported that they did not have the appropriate resources/technology to support such a visit and instead preferred to proceed with telephone follow up. The patient/wife has given verbal consent for this type of encounter. The time spent during this encounter was 15 minutes. The attendants for this meeting include Travis Olivera PA-C, patient, Travis Cruz and his wife, Travis Cruz. During the encounter, Janzen Sacks PA-C, was located at [facility] Radiation Oncology Department.  Patient, Travis Cruz and his wife, Travis Cruz, were located at home.   Travis Johns, PA-C    Tyler Pita, MD  [facility] Direct Dial: [phone]  Fax: [phone] [email or web address]  Skype  LinkedIn

## 2021-11-28 ENCOUNTER — Telehealth: Payer: Self-pay | Admitting: *Deleted

## 2021-11-28 DIAGNOSIS — Z86718 Personal history of other venous thrombosis and embolism: Secondary | ICD-10-CM

## 2021-11-28 DIAGNOSIS — Z86711 Personal history of pulmonary embolism: Secondary | ICD-10-CM

## 2021-11-28 NOTE — Telephone Encounter (Signed)
Description   continue to take half a tablet of the 3 mg tablets every Monday and Friday and continue 3 mg tablets the rest of the week.  INR  2.8(goal is 2-3),  Recheck in 1-2 weeks, checks at home

## 2021-11-28 NOTE — Telephone Encounter (Signed)
Fax received mdINR PT/INR self testing service Test date/time 11/27/21 656 pm INR 2.8

## 2021-11-28 NOTE — Telephone Encounter (Signed)
Family aware

## 2021-12-04 DIAGNOSIS — J449 Chronic obstructive pulmonary disease, unspecified: Secondary | ICD-10-CM | POA: Diagnosis not present

## 2021-12-05 ENCOUNTER — Telehealth: Payer: Self-pay | Admitting: *Deleted

## 2021-12-05 DIAGNOSIS — Z86711 Personal history of pulmonary embolism: Secondary | ICD-10-CM

## 2021-12-05 DIAGNOSIS — Z86718 Personal history of other venous thrombosis and embolism: Secondary | ICD-10-CM

## 2021-12-05 NOTE — Telephone Encounter (Signed)
Description   continue to take half a tablet of the 3 mg tablets every Monday and Friday and continue 3 mg tablets the rest of the week.  INR  2.7(goal is 2-3),  Recheck in 1-2 weeks, checks at home       Caryl Pina, MD [place] 12/05/2021, 7:48 AM

## 2021-12-05 NOTE — Telephone Encounter (Signed)
Wife aware and verbalized understanding

## 2021-12-05 NOTE — Telephone Encounter (Signed)
Fax received mdINR PT/INR self testing service Test date/time 12/04/21 630 pm INR 2.7

## 2021-12-11 DIAGNOSIS — Z7901 Long term (current) use of anticoagulants: Secondary | ICD-10-CM | POA: Diagnosis not present

## 2021-12-11 DIAGNOSIS — Z86718 Personal history of other venous thrombosis and embolism: Secondary | ICD-10-CM | POA: Diagnosis not present

## 2021-12-12 ENCOUNTER — Telehealth: Payer: Self-pay | Admitting: *Deleted

## 2021-12-12 DIAGNOSIS — Z86718 Personal history of other venous thrombosis and embolism: Secondary | ICD-10-CM

## 2021-12-12 DIAGNOSIS — Z86711 Personal history of pulmonary embolism: Secondary | ICD-10-CM

## 2021-12-12 NOTE — Telephone Encounter (Signed)
Description   continue to take half a tablet of the 3 mg tablets every Monday and Friday and continue 3 mg tablets the rest of the week.  INR  2.9(goal is 2-3),  Recheck in 1-2 weeks, checks at home       Caryl Pina, MD [place] 12/12/2021, 9:18 AM

## 2021-12-12 NOTE — Telephone Encounter (Signed)
Contacted patient. Notified patient. Patient verbalized understanding

## 2021-12-12 NOTE — Telephone Encounter (Signed)
Fax received mdINR PT/INR self testing service Test date/time 12/11/21 555 pm INR 2.9

## 2021-12-19 ENCOUNTER — Telehealth: Payer: Self-pay | Admitting: *Deleted

## 2021-12-19 DIAGNOSIS — Z86711 Personal history of pulmonary embolism: Secondary | ICD-10-CM

## 2021-12-19 DIAGNOSIS — Z86718 Personal history of other venous thrombosis and embolism: Secondary | ICD-10-CM

## 2021-12-19 NOTE — Telephone Encounter (Signed)
Patient aware and verbalized understanding. °

## 2021-12-19 NOTE — Telephone Encounter (Signed)
Description   continue to take half a tablet of the 3 mg tablets every Monday and Friday and continue 3 mg tablets the rest of the week.  INR  2.1(goal is 2-3),  Recheck in 1-2 weeks, checks at home       Caryl Pina, MD [place] 12/19/2021, 12:32 PM

## 2021-12-19 NOTE — Telephone Encounter (Signed)
Fax received mdINR PT/INR self testing service Test date/time 12/18/21 639 pm INR 2.1

## 2021-12-26 ENCOUNTER — Telehealth: Payer: Self-pay | Admitting: *Deleted

## 2021-12-26 ENCOUNTER — Other Ambulatory Visit: Payer: Self-pay | Admitting: Family Medicine

## 2021-12-26 DIAGNOSIS — Z86718 Personal history of other venous thrombosis and embolism: Secondary | ICD-10-CM

## 2021-12-26 DIAGNOSIS — Z86711 Personal history of pulmonary embolism: Secondary | ICD-10-CM

## 2021-12-26 LAB — POCT INR: INR: 2.9 (ref 2–3)

## 2021-12-26 NOTE — Telephone Encounter (Signed)
[name] made aware

## 2021-12-26 NOTE — Telephone Encounter (Signed)
Continue coumadin as is

## 2021-12-26 NOTE — Telephone Encounter (Signed)
Fax received mdINR PT/INR self testing service Test date/time 12/25/21 631 pm INR 2.9

## 2022-01-02 ENCOUNTER — Telehealth: Payer: Self-pay | Admitting: *Deleted

## 2022-01-02 ENCOUNTER — Ambulatory Visit (INDEPENDENT_AMBULATORY_CARE_PROVIDER_SITE_OTHER): Payer: Medicare HMO

## 2022-01-02 DIAGNOSIS — I255 Ischemic cardiomyopathy: Secondary | ICD-10-CM

## 2022-01-02 DIAGNOSIS — I428 Other cardiomyopathies: Secondary | ICD-10-CM

## 2022-01-02 DIAGNOSIS — Z86718 Personal history of other venous thrombosis and embolism: Secondary | ICD-10-CM

## 2022-01-02 DIAGNOSIS — Z86711 Personal history of pulmonary embolism: Secondary | ICD-10-CM

## 2022-01-02 LAB — CUP PACEART REMOTE DEVICE CHECK
Battery Remaining Longevity: 23 mo
Battery Remaining Percentage: 22 %
Battery Voltage: 2.78 V
Brady Statistic RV Percent Paced: 1 %
Date Time Interrogation Session: 20230105020015
HighPow Impedance: 86 Ohm
HighPow Impedance: 86 Ohm
Implantable Lead Implant Date: 20140205
Implantable Lead Location: 753860
Implantable Lead Model: 181
Implantable Lead Serial Number: 323859
Implantable Pulse Generator Implant Date: 20140205
Lead Channel Impedance Value: 410 Ohm
Lead Channel Pacing Threshold Amplitude: 1 V
Lead Channel Pacing Threshold Pulse Width: 0.5 ms
Lead Channel Sensing Intrinsic Amplitude: 11.1 mV
Lead Channel Setting Pacing Amplitude: 2.5 V
Lead Channel Setting Pacing Pulse Width: 0.5 ms
Lead Channel Setting Sensing Sensitivity: 0.5 mV
Pulse Gen Serial Number: 7040910

## 2022-01-02 NOTE — Telephone Encounter (Signed)
Patient 's wife aware and verbalized understanding.

## 2022-01-02 NOTE — Telephone Encounter (Signed)
Fax received mdINR PT/INR self testing service Test date/time 01/01/22 609 pm INR 3.0

## 2022-01-02 NOTE — Telephone Encounter (Signed)
Description   continue to take half a tablet of the 3 mg tablets every Monday and Friday and continue 3 mg tablets the rest of the week.  INR  3.0(goal is 2-3),  Recheck in 1-2 weeks, checks at home       Caryl Pina, MD [place] 01/02/2022, 10:27 AM

## 2022-01-04 DIAGNOSIS — J449 Chronic obstructive pulmonary disease, unspecified: Secondary | ICD-10-CM | POA: Diagnosis not present

## 2022-01-05 ENCOUNTER — Other Ambulatory Visit: Payer: Self-pay | Admitting: Family Medicine

## 2022-01-05 DIAGNOSIS — I10 Essential (primary) hypertension: Secondary | ICD-10-CM

## 2022-01-05 DIAGNOSIS — I5032 Chronic diastolic (congestive) heart failure: Secondary | ICD-10-CM

## 2022-01-08 DIAGNOSIS — Z7901 Long term (current) use of anticoagulants: Secondary | ICD-10-CM | POA: Diagnosis not present

## 2022-01-08 DIAGNOSIS — Z86718 Personal history of other venous thrombosis and embolism: Secondary | ICD-10-CM | POA: Diagnosis not present

## 2022-01-09 ENCOUNTER — Telehealth: Payer: Self-pay | Admitting: *Deleted

## 2022-01-09 DIAGNOSIS — Z86711 Personal history of pulmonary embolism: Secondary | ICD-10-CM

## 2022-01-09 DIAGNOSIS — Z86718 Personal history of other venous thrombosis and embolism: Secondary | ICD-10-CM

## 2022-01-09 NOTE — Telephone Encounter (Signed)
Left message to call back  

## 2022-01-09 NOTE — Telephone Encounter (Signed)
Pts wife returned missed call. Reviewed Dr Neldon Mc note with her. She said she doesn't understand why INR is up either but says they will hold for today and take the medicine as Dr Dettinger suggested.

## 2022-01-09 NOTE — Telephone Encounter (Signed)
Fax received mdINR PT/INR self testing service Test date/time 01/08/22 618 pm INR 3.3

## 2022-01-09 NOTE — Telephone Encounter (Signed)
Description   I do not know why all of a sudden elevated, hold for today and then continue to take half a tablet of the 3 mg tablets every Monday and Friday and continue 3 mg tablets the rest of the week.  INR  3.3(goal is 2-3),  Recheck in 1-2 weeks, checks at home       Caryl Pina, MD [place] 01/09/2022, 7:48 AM

## 2022-01-13 NOTE — Progress Notes (Signed)
Remote ICD transmission.   

## 2022-01-14 ENCOUNTER — Other Ambulatory Visit: Payer: Self-pay | Admitting: Family Medicine

## 2022-01-14 DIAGNOSIS — F01518 Vascular dementia, unspecified severity, with other behavioral disturbance: Secondary | ICD-10-CM

## 2022-01-14 DIAGNOSIS — R609 Edema, unspecified: Secondary | ICD-10-CM

## 2022-01-14 DIAGNOSIS — N400 Enlarged prostate without lower urinary tract symptoms: Secondary | ICD-10-CM

## 2022-01-16 ENCOUNTER — Telehealth: Payer: Self-pay | Admitting: *Deleted

## 2022-01-16 DIAGNOSIS — Z86718 Personal history of other venous thrombosis and embolism: Secondary | ICD-10-CM

## 2022-01-16 DIAGNOSIS — Z86711 Personal history of pulmonary embolism: Secondary | ICD-10-CM

## 2022-01-16 NOTE — Telephone Encounter (Signed)
Description   continue to take half a tablet of the 3 mg tablets every Monday and Friday and continue 3 mg tablets the rest of the week.  INR  2.1(goal is 2-3),  Recheck in 1-2 weeks, checks at home       Caryl Pina, MD [place] 01/16/2022, 8:05 AM

## 2022-01-16 NOTE — Telephone Encounter (Signed)
Wife aware

## 2022-01-16 NOTE — Telephone Encounter (Signed)
Fax received mdINR PT/INR self testing service Test date/time 01/15/22 628 pm INR 2.1

## 2022-01-23 ENCOUNTER — Telehealth: Payer: Self-pay | Admitting: *Deleted

## 2022-01-23 DIAGNOSIS — Z86718 Personal history of other venous thrombosis and embolism: Secondary | ICD-10-CM

## 2022-01-23 DIAGNOSIS — Z86711 Personal history of pulmonary embolism: Secondary | ICD-10-CM

## 2022-01-23 NOTE — Telephone Encounter (Signed)
Description   continue to take half a tablet of the 3 mg tablets every Monday and Friday and continue 3 mg tablets the rest of the week.  INR  2.5(goal is 2-3),  Recheck in 1-2 weeks, checks at home       Caryl Pina, MD [facility] 01/23/2022, 8:52 AM

## 2022-01-23 NOTE — Telephone Encounter (Signed)
Fax received mdINR PT/INR self testing service Test date/time 01/22/22 657 pm INR 2.5

## 2022-01-23 NOTE — Telephone Encounter (Signed)
Pt aware of provider feedback and voiced understanding. 

## 2022-01-27 ENCOUNTER — Other Ambulatory Visit: Payer: Self-pay | Admitting: Family Medicine

## 2022-01-27 DIAGNOSIS — E785 Hyperlipidemia, unspecified: Secondary | ICD-10-CM

## 2022-01-30 ENCOUNTER — Telehealth: Payer: Self-pay | Admitting: *Deleted

## 2022-01-30 DIAGNOSIS — Z86718 Personal history of other venous thrombosis and embolism: Secondary | ICD-10-CM

## 2022-01-30 DIAGNOSIS — Z86711 Personal history of pulmonary embolism: Secondary | ICD-10-CM

## 2022-01-30 NOTE — Telephone Encounter (Signed)
Description   continue to take half a tablet of the 3 mg tablets every Monday and Friday and continue 3 mg tablets the rest of the week.  INR  2.9(goal is 2-3),  Recheck in 1-2 weeks, checks at home       Caryl Pina, MD [place] 01/30/2022, 12:34 PM

## 2022-01-30 NOTE — Telephone Encounter (Signed)
Fax received mdINR PT/INR self testing service Test date/time 01/29/22 620 pm INR 2.9

## 2022-01-30 NOTE — Telephone Encounter (Signed)
Wife aware of instruction

## 2022-02-04 DIAGNOSIS — J449 Chronic obstructive pulmonary disease, unspecified: Secondary | ICD-10-CM | POA: Diagnosis not present

## 2022-02-05 DIAGNOSIS — Z7901 Long term (current) use of anticoagulants: Secondary | ICD-10-CM | POA: Diagnosis not present

## 2022-02-05 DIAGNOSIS — Z86718 Personal history of other venous thrombosis and embolism: Secondary | ICD-10-CM | POA: Diagnosis not present

## 2022-02-06 ENCOUNTER — Telehealth: Payer: Self-pay | Admitting: *Deleted

## 2022-02-06 DIAGNOSIS — Z86711 Personal history of pulmonary embolism: Secondary | ICD-10-CM

## 2022-02-06 DIAGNOSIS — Z86718 Personal history of other venous thrombosis and embolism: Secondary | ICD-10-CM

## 2022-02-06 NOTE — Telephone Encounter (Signed)
Description   Hold for 2 days and then continue to take half a tablet of the 3 mg tablets every Monday and Friday and continue 3 mg tablets the rest of the week.  INR  4.4(goal is 2-3),  Recheck in 1-2 weeks, checks at home        Caryl Pina, MD [facility] 02/06/2022, 7:52 AM

## 2022-02-06 NOTE — Telephone Encounter (Signed)
[name] has been informed and understands the changes.

## 2022-02-06 NOTE — Telephone Encounter (Signed)
Fax received mdINR PT/INR self testing service Test date/time 02/05/22 620 pm INR 4.4

## 2022-02-13 ENCOUNTER — Telehealth: Payer: Self-pay | Admitting: Family Medicine

## 2022-02-13 DIAGNOSIS — Z86718 Personal history of other venous thrombosis and embolism: Secondary | ICD-10-CM

## 2022-02-13 DIAGNOSIS — Z86711 Personal history of pulmonary embolism: Secondary | ICD-10-CM

## 2022-02-13 NOTE — Telephone Encounter (Signed)
Fax received mdINR PT/INR self testing service Test date/time 02/12/2022 7:06pm INR 2.2

## 2022-02-14 NOTE — Telephone Encounter (Signed)
Left message informing [name].

## 2022-02-14 NOTE — Telephone Encounter (Signed)
Description   Continue to take half a tablet of the 3 mg tablets every Monday and Friday and continue 3 mg tablets the rest of the week.  INR  2.2(goal is 2-3),  Recheck in 1-2 weeks, checks at home        Caryl Pina, MD [facility] 02/14/2022, 7:52 AM

## 2022-02-20 ENCOUNTER — Telehealth: Payer: Self-pay | Admitting: *Deleted

## 2022-02-20 DIAGNOSIS — Z86718 Personal history of other venous thrombosis and embolism: Secondary | ICD-10-CM

## 2022-02-20 DIAGNOSIS — Z86711 Personal history of pulmonary embolism: Secondary | ICD-10-CM

## 2022-02-20 NOTE — Telephone Encounter (Signed)
Description   Continue to take half a tablet of the 3 mg tablets every Monday and Friday and continue 3 mg tablets the rest of the week.  INR  2.4(goal is 2-3),  Recheck in 1-2 weeks, checks at home       Caryl Pina, MD [facility] 02/20/2022, 12:42 PM

## 2022-02-20 NOTE — Telephone Encounter (Signed)
[name] informed

## 2022-02-20 NOTE — Telephone Encounter (Signed)
Fax received mdINR PT/INR self testing service Test date/time 02/19/22 653 pm INR 2.4

## 2022-02-27 ENCOUNTER — Telehealth: Payer: Self-pay | Admitting: Family Medicine

## 2022-02-27 DIAGNOSIS — Z86718 Personal history of other venous thrombosis and embolism: Secondary | ICD-10-CM

## 2022-02-27 DIAGNOSIS — Z86711 Personal history of pulmonary embolism: Secondary | ICD-10-CM

## 2022-02-27 NOTE — Telephone Encounter (Signed)
Fax received ?mdINR PT/INR self testing service ?Test date/time 02/26/22 3:16pm ?INR 2.8  ?

## 2022-02-28 NOTE — Telephone Encounter (Signed)
Description   ?Continue to take half a tablet of the 3 mg tablets every Monday and Friday and continue 3 mg tablets the rest of the week. ? ?INR  2.8(goal is 2-3),  ?Recheck in 1-2 weeks, checks at home ? ? ? ?  ? ?Caryl Pina, MD ?[place] ?02/28/2022, 11:26 AM ? ? ?

## 2022-02-28 NOTE — Telephone Encounter (Signed)
[name] informed and understood. ?

## 2022-03-04 DIAGNOSIS — J449 Chronic obstructive pulmonary disease, unspecified: Secondary | ICD-10-CM | POA: Diagnosis not present

## 2022-03-05 ENCOUNTER — Telehealth: Payer: Self-pay | Admitting: *Deleted

## 2022-03-05 DIAGNOSIS — Z86718 Personal history of other venous thrombosis and embolism: Secondary | ICD-10-CM

## 2022-03-05 DIAGNOSIS — Z7901 Long term (current) use of anticoagulants: Secondary | ICD-10-CM | POA: Diagnosis not present

## 2022-03-05 DIAGNOSIS — Z86711 Personal history of pulmonary embolism: Secondary | ICD-10-CM

## 2022-03-05 NOTE — Telephone Encounter (Signed)
Travis Cruz has been informed and understood. No concerns at this time. ?

## 2022-03-05 NOTE — Telephone Encounter (Signed)
Description   ?Hold for 1 day and continue to take half a tablet of the 3 mg tablets every Monday and Friday and continue 3 mg tablets the rest of the week. ? ?INR  3.1(goal is 2-3),  ?Recheck in 1-2 weeks, checks at home ? ? ? ?  ? ?Caryl Pina, MD ?[place] ?03/05/2022, 3:37 PM ? ? ?

## 2022-03-05 NOTE — Telephone Encounter (Signed)
Fax received ?mdINR PT/INR self testing service ?Test date/time 03/05/22 622 am ?INR 3.1 ?

## 2022-03-07 DIAGNOSIS — G4733 Obstructive sleep apnea (adult) (pediatric): Secondary | ICD-10-CM | POA: Diagnosis not present

## 2022-03-12 ENCOUNTER — Telehealth (INDEPENDENT_AMBULATORY_CARE_PROVIDER_SITE_OTHER): Payer: Medicare HMO | Admitting: *Deleted

## 2022-03-12 DIAGNOSIS — Z86718 Personal history of other venous thrombosis and embolism: Secondary | ICD-10-CM

## 2022-03-12 DIAGNOSIS — Z86711 Personal history of pulmonary embolism: Secondary | ICD-10-CM

## 2022-03-12 NOTE — Addendum Note (Signed)
Addended by: Caryl Pina on: 03/12/2022 10:03 AM ? ? Modules accepted: Level of Service ? ?

## 2022-03-12 NOTE — Telephone Encounter (Signed)
Fax received ?mdINR PT/INR self testing service ?Test date/time 03/12/22 634 am ?INR 3.2 ?

## 2022-03-12 NOTE — Telephone Encounter (Signed)
Description   ?Hold for 1 day and reduce dose to take half a tablet of the 3 mg tablets every Monday, Wednesday  and Friday and continue 3 mg tablets the rest of the week. ? ?INR  3.2(goal is 2-3),  ?Recheck in 1-2 weeks, checks at home ? ? ? ?  ? ?Caryl Pina, MD ?[place] ?03/12/2022, 10:02 AM ? ? ?

## 2022-03-12 NOTE — Telephone Encounter (Signed)
[name] has been informed and understands Dettinger's recommendations. ?

## 2022-03-19 ENCOUNTER — Telehealth: Payer: Self-pay | Admitting: *Deleted

## 2022-03-19 ENCOUNTER — Other Ambulatory Visit: Payer: Self-pay | Admitting: Family Medicine

## 2022-03-19 DIAGNOSIS — N400 Enlarged prostate without lower urinary tract symptoms: Secondary | ICD-10-CM

## 2022-03-19 DIAGNOSIS — Z86711 Personal history of pulmonary embolism: Secondary | ICD-10-CM

## 2022-03-19 DIAGNOSIS — Z86718 Personal history of other venous thrombosis and embolism: Secondary | ICD-10-CM

## 2022-03-19 NOTE — Telephone Encounter (Signed)
Fax received ?mdINR PT/INR self testing service ?Test date/time 03/19/22 625 am ?INR 3.1 ?

## 2022-03-19 NOTE — Telephone Encounter (Signed)
Description   ?Hold for 1 day and continue dose to take half a tablet of the 3 mg tablets every Monday, Wednesday  and Friday and continue 3 mg tablets the rest of the week. ? ?INR  3.1(goal is 2-3),  ?Recheck in 1-2 weeks, checks at home ? ? ? ?  ? ?Caryl Pina, MD ?[place] ?03/19/2022, 8:39 AM ? ? ?

## 2022-03-19 NOTE — Telephone Encounter (Signed)
Left message to call back  

## 2022-03-26 ENCOUNTER — Telehealth: Payer: Self-pay | Admitting: *Deleted

## 2022-03-26 DIAGNOSIS — Z86711 Personal history of pulmonary embolism: Secondary | ICD-10-CM

## 2022-03-26 DIAGNOSIS — Z86718 Personal history of other venous thrombosis and embolism: Secondary | ICD-10-CM

## 2022-03-26 NOTE — Telephone Encounter (Signed)
Description   ?continue dose to take half a tablet of the 3 mg tablets every Monday, Wednesday  and Friday and continue 3 mg tablets the rest of the week. ? ?INR  2.6(goal is 2-3),  ?Recheck in 1-2 weeks, checks at home ? ? ? ?  ? ?Caryl Pina, MD ?[place] ?03/26/2022, 8:59 AM ? ? ?

## 2022-03-26 NOTE — Telephone Encounter (Signed)
[name] informed ?

## 2022-03-26 NOTE — Telephone Encounter (Signed)
Fax received ?mdINR PT/INR self testing service ?Test date/time 03/26/22 722 am ?INR 2.6 ?

## 2022-04-02 ENCOUNTER — Telehealth: Payer: Self-pay | Admitting: *Deleted

## 2022-04-02 DIAGNOSIS — Z86718 Personal history of other venous thrombosis and embolism: Secondary | ICD-10-CM | POA: Diagnosis not present

## 2022-04-02 DIAGNOSIS — Z7901 Long term (current) use of anticoagulants: Secondary | ICD-10-CM | POA: Diagnosis not present

## 2022-04-02 DIAGNOSIS — Z86711 Personal history of pulmonary embolism: Secondary | ICD-10-CM

## 2022-04-02 NOTE — Telephone Encounter (Signed)
[name] has been made aware. ?

## 2022-04-02 NOTE — Telephone Encounter (Signed)
Description   ?continue dose to take half a tablet of the 3 mg tablets every Monday, Wednesday  and Friday and continue 3 mg tablets the rest of the week. ? ?INR  3.0(goal is 2-3),  ?Recheck in 1-2 weeks, checks at home ? ? ? ?  ? ?Caryl Pina, MD ?[place] ?04/02/2022, 8:32 AM ? ? ?

## 2022-04-02 NOTE — Telephone Encounter (Signed)
Fax received ?mdINR PT/INR self testing service ?Test date/time 04/02/22 626 am ?INR 3.0 ?

## 2022-04-03 ENCOUNTER — Ambulatory Visit (INDEPENDENT_AMBULATORY_CARE_PROVIDER_SITE_OTHER): Payer: Medicare HMO

## 2022-04-03 DIAGNOSIS — I255 Ischemic cardiomyopathy: Secondary | ICD-10-CM

## 2022-04-03 DIAGNOSIS — I5032 Chronic diastolic (congestive) heart failure: Secondary | ICD-10-CM

## 2022-04-03 LAB — CUP PACEART REMOTE DEVICE CHECK
Battery Remaining Longevity: 18 mo
Battery Remaining Percentage: 17 %
Battery Voltage: 2.75 V
Brady Statistic RV Percent Paced: 1 %
Date Time Interrogation Session: 20230406020016
HighPow Impedance: 91 Ohm
HighPow Impedance: 91 Ohm
Implantable Lead Implant Date: 20140205
Implantable Lead Location: 753860
Implantable Lead Model: 181
Implantable Lead Serial Number: 323859
Implantable Pulse Generator Implant Date: 20140205
Lead Channel Impedance Value: 450 Ohm
Lead Channel Pacing Threshold Amplitude: 1 V
Lead Channel Pacing Threshold Pulse Width: 0.5 ms
Lead Channel Sensing Intrinsic Amplitude: 11.8 mV
Lead Channel Setting Pacing Amplitude: 2.5 V
Lead Channel Setting Pacing Pulse Width: 0.5 ms
Lead Channel Setting Sensing Sensitivity: 0.5 mV
Pulse Gen Serial Number: 7040910

## 2022-04-04 DIAGNOSIS — J449 Chronic obstructive pulmonary disease, unspecified: Secondary | ICD-10-CM | POA: Diagnosis not present

## 2022-04-09 ENCOUNTER — Telehealth: Payer: Self-pay | Admitting: *Deleted

## 2022-04-09 DIAGNOSIS — Z86711 Personal history of pulmonary embolism: Secondary | ICD-10-CM

## 2022-04-09 DIAGNOSIS — Z86718 Personal history of other venous thrombosis and embolism: Secondary | ICD-10-CM

## 2022-04-09 LAB — POCT INR: INR: 3.3 — AB (ref 2–3)

## 2022-04-09 NOTE — Telephone Encounter (Signed)
Fax received ?mdINR PT/INR self testing service ?Test date/time 04/09/22 634 am ?INR 3.3 ?

## 2022-04-09 NOTE — Telephone Encounter (Signed)
Continue coumadin as is. Recheck in one week. Could eat extra greens this week. ?

## 2022-04-09 NOTE — Telephone Encounter (Signed)
[name] has been informed. She has no concerns at this time. ?

## 2022-04-10 ENCOUNTER — Encounter: Payer: Self-pay | Admitting: Pulmonary Disease

## 2022-04-10 MED ORDER — AZITHROMYCIN 250 MG PO TABS: 250.0000 mg | ORAL_TABLET | ORAL | 4 refills | Status: AC

## 2022-04-12 ENCOUNTER — Other Ambulatory Visit: Payer: Self-pay | Admitting: Family Medicine

## 2022-04-12 DIAGNOSIS — I10 Essential (primary) hypertension: Secondary | ICD-10-CM

## 2022-04-12 DIAGNOSIS — I5032 Chronic diastolic (congestive) heart failure: Secondary | ICD-10-CM

## 2022-04-16 ENCOUNTER — Telehealth: Payer: Self-pay | Admitting: *Deleted

## 2022-04-16 ENCOUNTER — Ambulatory Visit (INDEPENDENT_AMBULATORY_CARE_PROVIDER_SITE_OTHER): Payer: Medicare HMO

## 2022-04-16 VITALS — Ht 69.0 in | Wt 230.0 lb

## 2022-04-16 DIAGNOSIS — Z86711 Personal history of pulmonary embolism: Secondary | ICD-10-CM

## 2022-04-16 DIAGNOSIS — Z86718 Personal history of other venous thrombosis and embolism: Secondary | ICD-10-CM

## 2022-04-16 DIAGNOSIS — Z0000 Encounter for general adult medical examination without abnormal findings: Secondary | ICD-10-CM

## 2022-04-16 NOTE — Patient Instructions (Signed)
Travis Cruz , ?Thank you for taking time to come for your Medicare Wellness Visit. I appreciate your ongoing commitment to your health goals. Please review the following plan we discussed and let me know if I can assist you in the future.  ? ?Screening recommendations/referrals: ?Colonoscopy: Done 06/30/2013. No longer required. ? ?Recommended yearly ophthalmology/optometry visit for glaucoma screening and checkup ?Recommended yearly dental visit for hygiene and checkup ? ?Vaccinations: ?Influenza vaccine: Done 11/01/2021 Repeat annually ? ?Pneumococcal vaccine: Done 12/02/2014 and 07/10/2020 ?Tdap vaccine: Done 10/29/2020 Repeat in 10 years ? ?Shingles vaccine: Discussed.   ?Covid-19: Done 02/05/2021, 03/21/2020, 02/22/2020 ? ?Advanced directives: Please bring a copy of your health care power of attorney and living will to the office to be added to your chart at your convenience. ? ? ?Conditions/risks identified: Aim for 30 minutes of exercise, 6-8 glasses of water, and 5 servings of fruits and vegetables each day. ? ? ?Next appointment: Follow up in one year for your annual wellness visit. 2024. ? ?Preventive Care 12 Years and Older, Male ? ?Preventive care refers to lifestyle choices and visits with your health care provider that can promote health and wellness. ?What does preventive care include? ?A yearly physical exam. This is also called an annual well check. ?Dental exams once or twice a year. ?Routine eye exams. Ask your health care provider how often you should have your eyes checked. ?Personal lifestyle choices, including: ?Daily care of your teeth and gums. ?Regular physical activity. ?Eating a healthy diet. ?Avoiding tobacco and drug use. ?Limiting alcohol use. ?Practicing safe sex. ?Taking low doses of aspirin every day. ?Taking vitamin and mineral supplements as recommended by your health care provider. ?What happens during an annual well check? ?The services and screenings done by your health care provider  during your annual well check will depend on your age, overall health, lifestyle risk factors, and family history of disease. ?Counseling  ?Your health care provider may ask you questions about your: ?Alcohol use. ?Tobacco use. ?Drug use. ?Emotional well-being. ?Home and relationship well-being. ?Sexual activity. ?Eating habits. ?History of falls. ?Memory and ability to understand (cognition). ?Work and work Statistician. ?Screening  ?You may have the following tests or measurements: ?Height, weight, and BMI. ?Blood pressure. ?Lipid and cholesterol levels. These may be checked every 5 years, or more frequently if you are over 50 years old. ?Skin check. ?Lung cancer screening. You may have this screening every year starting at age 91 if you have a 30-pack-year history of smoking and currently smoke or have quit within the past 15 years. ?Fecal occult blood test (FOBT) of the stool. You may have this test every year starting at age 42. ?Flexible sigmoidoscopy or colonoscopy. You may have a sigmoidoscopy every 5 years or a colonoscopy every 10 years starting at age 25. ?Prostate cancer screening. Recommendations will vary depending on your family history and other risks. ?Hepatitis C blood test. ?Hepatitis B blood test. ?Sexually transmitted disease (STD) testing. ?Diabetes screening. This is done by checking your blood sugar (glucose) after you have not eaten for a while (fasting). You may have this done every 1-3 years. ?Abdominal aortic aneurysm (AAA) screening. You may need this if you are a current or former smoker. ?Osteoporosis. You may be screened starting at age 21 if you are at high risk. ?Talk with your health care provider about your test results, treatment options, and if necessary, the need for more tests. ?Vaccines  ?Your health care provider may recommend certain vaccines, such as: ?Influenza  vaccine. This is recommended every year. ?Tetanus, diphtheria, and acellular pertussis (Tdap, Td) vaccine. You  may need a Td booster every 10 years. ?Zoster vaccine. You may need this after age 27. ?Pneumococcal 13-valent conjugate (PCV13) vaccine. One dose is recommended after age 59. ?Pneumococcal polysaccharide (PPSV23) vaccine. One dose is recommended after age 14. ?Talk to your health care provider about which screenings and vaccines you need and how often you need them. ?This information is not intended to replace advice given to you by your health care provider. Make sure you discuss any questions you have with your health care provider. ?Document Released: 01/11/2016 Document Revised: 09/03/2016 Document Reviewed: 10/16/2015 ?Elsevier Interactive Patient Education ? 2017 [place]. ? ?Fall Prevention in the Home ?Falls can cause injuries. They can happen to people of all ages. There are many things you can do to make your home safe and to help prevent falls. ?What can I do on the outside of my home? ?Regularly fix the edges of walkways and driveways and fix any cracks. ?Remove anything that might make you trip as you walk through a door, such as a raised step or threshold. ?Trim any bushes or trees on the path to your home. ?Use bright outdoor lighting. ?Clear any walking paths of anything that might make someone trip, such as rocks or tools. ?Regularly check to see if handrails are loose or broken. Make sure that both sides of any steps have handrails. ?Any raised decks and porches should have guardrails on the edges. ?Have any leaves, snow, or ice cleared regularly. ?Use sand or salt on walking paths during winter. ?Clean up any spills in your garage right away. This includes oil or grease spills. ?What can I do in the bathroom? ?Use night lights. ?Install grab bars by the toilet and in the tub and shower. Do not use towel bars as grab bars. ?Use non-skid mats or decals in the tub or shower. ?If you need to sit down in the shower, use a plastic, non-slip stool. ?Keep the floor dry. Clean up any water that spills  on the floor as soon as it happens. ?Remove soap buildup in the tub or shower regularly. ?Attach bath mats securely with double-sided non-slip rug tape. ?Do not have throw rugs and other things on the floor that can make you trip. ?What can I do in the bedroom? ?Use night lights. ?Make sure that you have a light by your bed that is easy to reach. ?Do not use any sheets or blankets that are too big for your bed. They should not hang down onto the floor. ?Have a firm chair that has side arms. You can use this for support while you get dressed. ?Do not have throw rugs and other things on the floor that can make you trip. ?What can I do in the kitchen? ?Clean up any spills right away. ?Avoid walking on wet floors. ?Keep items that you use a lot in easy-to-reach places. ?If you need to reach something above you, use a strong step stool that has a grab bar. ?Keep electrical cords out of the way. ?Do not use floor polish or wax that makes floors slippery. If you must use wax, use non-skid floor wax. ?Do not have throw rugs and other things on the floor that can make you trip. ?What can I do with my stairs? ?Do not leave any items on the stairs. ?Make sure that there are handrails on both sides of the stairs and use them. Fix  handrails that are broken or loose. Make sure that handrails are as long as the stairways. ?Check any carpeting to make sure that it is firmly attached to the stairs. Fix any carpet that is loose or worn. ?Avoid having throw rugs at the top or bottom of the stairs. If you do have throw rugs, attach them to the floor with carpet tape. ?Make sure that you have a light switch at the top of the stairs and the bottom of the stairs. If you do not have them, ask someone to add them for you. ?What else can I do to help prevent falls? ?Wear shoes that: ?Do not have high heels. ?Have rubber bottoms. ?Are comfortable and fit you well. ?Are closed at the toe. Do not wear sandals. ?If you use a stepladder: ?Make  sure that it is fully opened. Do not climb a closed stepladder. ?Make sure that both sides of the stepladder are locked into place. ?Ask someone to hold it for you, if possible. ?Clearly mark and make sure t
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
## 2022-04-16 NOTE — Telephone Encounter (Signed)
Fax received ?mdINR PT/INR self testing service ?Test date/time 04/16/22 1019 am ?INR 2.3 ?

## 2022-04-16 NOTE — Telephone Encounter (Signed)
Description   ?continue dose to take half a tablet of the 3 mg tablets every Monday, Wednesday  and Friday and continue 3 mg tablets the rest of the week. ? ?INR  2.3(goal is 2-3),  ?Recheck in 1-2 weeks, checks at home ? ? ? ?  ? ?Caryl Pina, MD ?[place] ?04/16/2022, 1:56 PM ? ? ?

## 2022-04-16 NOTE — Telephone Encounter (Signed)
Patient aware.

## 2022-04-18 NOTE — Progress Notes (Signed)
Remote ICD transmission.   

## 2022-04-24 ENCOUNTER — Telehealth: Payer: Self-pay | Admitting: *Deleted

## 2022-04-24 DIAGNOSIS — Z86718 Personal history of other venous thrombosis and embolism: Secondary | ICD-10-CM

## 2022-04-24 DIAGNOSIS — Z86711 Personal history of pulmonary embolism: Secondary | ICD-10-CM

## 2022-04-24 NOTE — Telephone Encounter (Signed)
Therapeutic. Continue current regimen ?

## 2022-04-24 NOTE — Telephone Encounter (Signed)
Fax received ?mdINR PT/INR self testing service ?Test date/time 04/23/22 708 pm ?INR 2.8 ?

## 2022-04-30 ENCOUNTER — Telehealth: Payer: Self-pay | Admitting: *Deleted

## 2022-04-30 DIAGNOSIS — Z86711 Personal history of pulmonary embolism: Secondary | ICD-10-CM

## 2022-04-30 DIAGNOSIS — Z86718 Personal history of other venous thrombosis and embolism: Secondary | ICD-10-CM | POA: Diagnosis not present

## 2022-04-30 DIAGNOSIS — Z7901 Long term (current) use of anticoagulants: Secondary | ICD-10-CM | POA: Diagnosis not present

## 2022-04-30 NOTE — Telephone Encounter (Signed)
Description   ?Elevated today, hold for 1 day and then continue dose to take half a tablet of the 3 mg tablets every Monday, Wednesday  and Friday and continue 3 mg tablets the rest of the week. ? ?INR  3.5(goal is 2-3),  ?Recheck in 1-2 weeks, checks at home ? ? ? ?  ? ?Caryl Pina, MD ?[place] ?04/30/2022, 10:00 AM ? ? ?

## 2022-04-30 NOTE — Telephone Encounter (Signed)
Fax received ?mdINR PT/INR self testing service ?Test date/time 04/30/22 635 am ?INR 3.5 ?

## 2022-04-30 NOTE — Telephone Encounter (Signed)
Pt wife verbalized understanding ?

## 2022-05-01 ENCOUNTER — Ambulatory Visit (INDEPENDENT_AMBULATORY_CARE_PROVIDER_SITE_OTHER): Payer: Medicare HMO | Admitting: Family Medicine

## 2022-05-01 ENCOUNTER — Encounter: Payer: Self-pay | Admitting: Family Medicine

## 2022-05-01 VITALS — BP 118/73 | HR 84 | Ht 69.0 in | Wt 228.0 lb

## 2022-05-01 DIAGNOSIS — J9611 Chronic respiratory failure with hypoxia: Secondary | ICD-10-CM | POA: Diagnosis not present

## 2022-05-01 DIAGNOSIS — Z86718 Personal history of other venous thrombosis and embolism: Secondary | ICD-10-CM | POA: Diagnosis not present

## 2022-05-01 DIAGNOSIS — F03918 Unspecified dementia, unspecified severity, with other behavioral disturbance: Secondary | ICD-10-CM

## 2022-05-01 DIAGNOSIS — I10 Essential (primary) hypertension: Secondary | ICD-10-CM | POA: Diagnosis not present

## 2022-05-01 DIAGNOSIS — N400 Enlarged prostate without lower urinary tract symptoms: Secondary | ICD-10-CM | POA: Diagnosis not present

## 2022-05-01 DIAGNOSIS — E782 Mixed hyperlipidemia: Secondary | ICD-10-CM | POA: Diagnosis not present

## 2022-05-01 DIAGNOSIS — J449 Chronic obstructive pulmonary disease, unspecified: Secondary | ICD-10-CM | POA: Diagnosis not present

## 2022-05-01 DIAGNOSIS — Z86711 Personal history of pulmonary embolism: Secondary | ICD-10-CM

## 2022-05-01 DIAGNOSIS — I5042 Chronic combined systolic (congestive) and diastolic (congestive) heart failure: Secondary | ICD-10-CM

## 2022-05-01 DIAGNOSIS — R69 Illness, unspecified: Secondary | ICD-10-CM | POA: Diagnosis not present

## 2022-05-01 MED ORDER — WARFARIN SODIUM 3 MG PO TABS: ORAL_TABLET | ORAL | 1 refills | Status: DC

## 2022-05-01 MED ORDER — FINASTERIDE 5 MG PO TABS: 5.0000 mg | ORAL_TABLET | Freq: Every day | ORAL | 3 refills | Status: AC

## 2022-05-01 NOTE — Progress Notes (Signed)
? ?BP 118/73   Pulse 84   Ht '5\' 9"'  (1.753 m)   Wt 228 lb (103.4 kg)   SpO2 95%   BMI 33.67 kg/m?   ? ?Subjective:  ? ?Patient ID: Travis Cruz, male    DOB: 10-12-1946, 76 y.o.   MRN: [id number] ? ?HPI: ?Travis Cruz is a 76 y.o. male presenting on 05/01/2022 for Medical Management of Chronic Issues, Hyperlipidemia, and Hypertension ? ? ?HPI ?Hypertension ?Patient is currently on metoprolol and furosemide, and their blood pressure today is 118/73. Patient denies any lightheadedness or dizziness. Patient denies headaches, blurred vision, chest pains, shortness of breath, or weakness. Denies any side effects from medication and is content with current medication.  ? ?Hyperlipidemia ?Patient is coming in for recheck of his hyperlipidemia. The patient is currently taking atorvastatin. They deny any issues with myalgias or history of liver damage from it. They deny any focal numbness or weakness or chest pain.  ? ?COPD ?Patient is coming in for COPD recheck today.  He is currently on Trelegy and albuterol.  He has a mild chronic cough but denies any major coughing spells or wheezing spells.  He has 0nighttime symptoms per week and 0daytime symptoms per week currently.  She denies him having any major wheezing or shortness of breath besides baseline.  He is currently on 3 L nasal cannula and has been stable on that for quite some time. ? ?CHF recheck ?Patient has congestive heart failure and says that he has been having more stamina issues per wife.  Patient has dementia so she provides most the history.  She denies any fluid increase that she is noted.  She does weigh him frequently and his weight has been very consistent within 1 or 2 pounds. ? ?His memory has been consistently worsening and she says gets a point where he is having trouble even following some basic commands at times. ? ?Relevant past medical, surgical, family and social history reviewed and updated as indicated. Interim medical history  since our last visit reviewed. ?Allergies and medications reviewed and updated. ? ?Review of Systems  ?Constitutional:  Positive for fatigue. Negative for chills and fever.  ?HENT:  Negative for congestion.   ?Eyes:  Negative for visual disturbance.  ?Respiratory:  Negative for cough, shortness of breath and wheezing.   ?Cardiovascular:  Negative for chest pain, palpitations and leg swelling.  ?Musculoskeletal:  Negative for back pain and gait problem.  ?Skin:  Negative for rash.  ?Neurological:  Negative for dizziness and light-headedness.  ?Psychiatric/Behavioral:  Positive for confusion.   ?All other systems reviewed and are negative. ? ?Per HPI unless specifically indicated above ? ? ?Allergies as of 05/01/2022   ? ?   Reactions  ? Ativan [lorazepam] Anxiety, Other (See Comments)  ? Hyper  ?[name]  ? Cefuroxime Axetil Rash  ? Zinacef [cefuroxime] Rash  ? ?  ? ?  ?Medication List  ?  ? ?  ? Accurate as of May 01, 2022  9:18 AM. If you have any questions, ask your nurse or doctor.  ?  ?  ? ?  ? ?acetaminophen 325 MG tablet ?Commonly known as: TYLENOL ?Take 2 tablets (650 mg total) by mouth every 6 (six) hours as needed for mild pain, fever or headache (or Fever >/= 101). ?  ?albuterol 108 (90 Base) MCG/ACT inhaler ?Commonly known as: VENTOLIN HFA ?Inhale 2 puffs into the lungs every 4 (four) hours as needed for  wheezing or shortness of breath. ?  ?ascorbic acid 500 MG tablet ?Commonly known as: VITAMIN C ?Take 1 tablet (500 mg total) by mouth daily. ?  ?atorvastatin 40 MG tablet ?Commonly known as: LIPITOR ?TAKE 1 TABLET (40 MG TOTAL) BY MOUTH EVERY MONDAY, WEDNESDAY, AND FRIDAY. ?  ?azithromycin 250 MG tablet ?Commonly known as: ZITHROMAX ?Take 1 tablet (250 mg total) by mouth 3 (three) times a week. Monday Wednesday Friday. ?  ?finasteride 5 MG tablet ?Commonly known as: PROSCAR ?Take 1 tablet (5 mg total) by mouth daily. ?  ?furosemide 20 MG tablet ?Commonly known as:  LASIX ?TAKE 2 TABLETS BY MOUTH EVERY DAY ?  ?metoprolol tartrate 25 MG tablet ?Commonly known as: LOPRESSOR ?TAKE 1.5 TABLETS (37.5 MG TOTAL) BY MOUTH TWICE A DAY ?  ?Moderna COVID-19 Bival Booster 50 MCG/0.5ML injection ?Generic drug: COVID-19 mRNA bivalent vaccine (Moderna) ?  ?multivitamin with minerals Tabs tablet ?Take 1 tablet by mouth daily. ?  ?OXYGEN ?Inhale 3 L/L into the lungs. ?  ?PRESERVISION AREDS PO ?Take 1 capsule by mouth 2 (two) times daily. ?  ?sertraline 100 MG tablet ?Commonly known as: ZOLOFT ?TAKE 1 TABLET BY MOUTH EVERY DAY ?  ?tamsulosin 0.4 MG Caps capsule ?Commonly known as: FLOMAX ?Take 1 capsule (0.4 mg total) by mouth daily after supper. ?  ?Trelegy Ellipta 100-62.5-25 MCG/ACT Aepb ?Generic drug: Fluticasone-Umeclidin-Vilant ?Inhale 1 puff into the lungs daily. ?  ?Vitamin D3 50 MCG (2000 UT) Tabs ?Take 2,000 Units by mouth daily. ?  ?warfarin 3 MG tablet ?Commonly known as: COUMADIN ?Take as directed by the anticoagulation clinic. If you are unsure how to take this medication, talk to your nurse or doctor. ?Original instructions: Take 1/2 tab on Mon Wednesday & Fri, and 1 tab (3 mg) on all other days ?What changed:  ?additional instructions ?Another medication with the same name was removed. Continue taking this medication, and follow the directions you see here. ?Changed by: Worthy Rancher, MD ?  ? ?  ? ? ? ?Objective:  ? ?BP 118/73   Pulse 84   Ht '5\' 9"'  (1.753 m)   Wt 228 lb (103.4 kg)   SpO2 95%   BMI 33.67 kg/m?   ?Wt Readings from Last 3 Encounters:  ?05/01/22 228 lb (103.4 kg)  ?04/16/22 230 lb (104.3 kg)  ?11/05/21 230 lb (104.3 kg)  ?  ?Physical Exam ?Vitals and nursing note reviewed.  ?Constitutional:   ?   General: He is not in acute distress. ?   Appearance: He is well-developed. He is not diaphoretic.  ?Eyes:  ?   General: No scleral icterus. ?   Conjunctiva/sclera: Conjunctivae normal.  ?Neck:  ?   Thyroid: No thyromegaly.  ?Cardiovascular:  ?   Rate and Rhythm:  Normal rate and regular rhythm.  ?   Heart sounds: Normal heart sounds. No murmur heard. ?Pulmonary:  ?   Effort: Pulmonary effort is normal. No respiratory distress.  ?   Breath sounds: Normal breath sounds. No wheezing.  ?Musculoskeletal:     ?   General: No swelling. Normal range of motion.  ?   Cervical back: Neck supple.  ?Lymphadenopathy:  ?   Cervical: No cervical adenopathy.  ?Skin: ?   General: Skin is warm and dry.  ?   Findings: No rash.  ?Neurological:  ?   Mental Status: He is alert and oriented to person, place, and time.  ?   Coordination: Coordination normal.  ?Psychiatric:     ?  Behavior: Behavior normal.  ? ? ? ? ?Assessment & Plan:  ? ?Problem List Items Addressed This Visit   ? ?  ? Cardiovascular and Mediastinum  ? Hypertension  ? Relevant Medications  ? warfarin (COUMADIN) 3 MG tablet  ? Other Relevant Orders  ? CBC with Differential/Platelet  ? CMP14+EGFR  ? Lipid panel  ? Systolic and diastolic CHF, chronic ([place])  ? Relevant Medications  ? warfarin (COUMADIN) 3 MG tablet  ?  ? Respiratory  ? COPD (chronic obstructive pulmonary disease) ([name])  ? Chronic respiratory failure with hypoxia ([facility])  ?  ? Nervous and Auditory  ? Dementia with behavioral disturbance  ? Relevant Orders  ? CBC with Differential/Platelet  ? CMP14+EGFR  ? Lipid panel  ?  ? Other  ? Hyperlipidemia - Primary  ? Relevant Medications  ? warfarin (COUMADIN) 3 MG tablet  ? Other Relevant Orders  ? CBC with Differential/Platelet  ? CMP14+EGFR  ? Lipid panel  ? History of DVT (deep vein thrombosis)  ? Relevant Medications  ? warfarin (COUMADIN) 3 MG tablet  ? History of pulmonary embolus ([place])  ? Relevant Medications  ? warfarin (COUMADIN) 3 MG tablet  ? ?Other Visit Diagnoses   ? ? Benign prostatic hyperplasia, unspecified whether lower urinary tract symptoms present      ? Relevant Medications  ? finasteride (PROSCAR) 5 MG tablet  ? ?  ?  ?Description   ?Elevated yesterday, hold for 1 day and then continue dose to take half a  tablet of the 3 mg tablets every Monday, Wednesday  and Friday and continue 3 mg tablets the rest of the week. ? ?INR  3.5(goal is 2-3),  ?Recheck in 1-2 weeks, checks at home ? ? ? ?  ? ? ?Patient does home INRs, he alre

## 2022-05-02 LAB — LIPID PANEL
Chol/HDL Ratio: 2.9 ratio (ref 0.0–5.0)
Cholesterol, Total: 154 mg/dL (ref 100–199)
HDL: 53 mg/dL (ref >39)
LDL Chol Calc (NIH): 82 mg/dL (ref 0–99)
Triglycerides: 107 mg/dL (ref 0–149)
VLDL Cholesterol Cal: 19 mg/dL (ref 5–40)

## 2022-05-02 LAB — CBC WITH DIFFERENTIAL/PLATELET
Basophils Absolute: 0.1 10*3/uL (ref 0.0–0.2)
Basos: 0 %
EOS (ABSOLUTE): 0 10*3/uL (ref 0.0–0.4)
Eos: 0 %
Hematocrit: 49.3 % (ref 37.5–51.0)
Hemoglobin: 16.1 g/dL (ref 13.0–17.7)
Immature Grans (Abs): 0.1 10*3/uL (ref 0.0–0.1)
Immature Granulocytes: 1 %
Lymphocytes Absolute: 2 10*3/uL (ref 0.7–3.1)
Lymphs: 14 %
MCH: 28.9 pg (ref 26.6–33.0)
MCHC: 32.7 g/dL (ref 31.5–35.7)
MCV: 88 fL (ref 79–97)
Monocytes Absolute: 1.8 10*3/uL — ABNORMAL HIGH (ref 0.1–0.9)
Monocytes: 12 %
Neutrophils Absolute: 10.9 10*3/uL — ABNORMAL HIGH (ref 1.4–7.0)
Neutrophils: 73 %
Platelets: 325 10*3/uL (ref 150–450)
RBC: 5.58 x10E6/uL (ref 4.14–5.80)
RDW: 13.9 % (ref 11.6–15.4)
WBC: 14.9 10*3/uL — ABNORMAL HIGH (ref 3.4–10.8)

## 2022-05-02 LAB — CMP14+EGFR
ALT: 22 IU/L (ref 0–44)
AST: 25 IU/L (ref 0–40)
Albumin/Globulin Ratio: 1.3 (ref 1.2–2.2)
Albumin: 3.8 g/dL (ref 3.7–4.7)
Alkaline Phosphatase: 154 IU/L — ABNORMAL HIGH (ref 44–121)
BUN/Creatinine Ratio: 17 (ref 10–24)
BUN: 15 mg/dL (ref 8–27)
Bilirubin Total: 0.4 mg/dL (ref 0.0–1.2)
CO2: 28 mmol/L (ref 20–29)
Calcium: 9.5 mg/dL (ref 8.6–10.2)
Chloride: 101 mmol/L (ref 96–106)
Creatinine, Ser: 0.86 mg/dL (ref 0.76–1.27)
Globulin, Total: 2.9 g/dL (ref 1.5–4.5)
Glucose: 118 mg/dL — ABNORMAL HIGH (ref 70–99)
Potassium: 4.1 mmol/L (ref 3.5–5.2)
Sodium: 143 mmol/L (ref 134–144)
Total Protein: 6.7 g/dL (ref 6.0–8.5)
eGFR: 91 mL/min/{1.73_m2} (ref >59)

## 2022-05-04 DIAGNOSIS — J449 Chronic obstructive pulmonary disease, unspecified: Secondary | ICD-10-CM | POA: Diagnosis not present

## 2022-05-07 ENCOUNTER — Telehealth: Payer: Self-pay | Admitting: *Deleted

## 2022-05-07 DIAGNOSIS — Z86711 Personal history of pulmonary embolism: Secondary | ICD-10-CM

## 2022-05-07 DIAGNOSIS — Z86718 Personal history of other venous thrombosis and embolism: Secondary | ICD-10-CM

## 2022-05-07 NOTE — Telephone Encounter (Signed)
Patient aware.

## 2022-05-07 NOTE — Telephone Encounter (Signed)
Description   ? continue dose to take half a tablet of the 3 mg tablets every Monday, Wednesday  and Friday and continue 3 mg tablets the rest of the week. ? ?INR  2.8(goal is 2-3),  ?Recheck in 1-2 weeks, checks at home ? ? ? ?  ? ?Caryl Pina, MD ?[place] ?05/07/2022, 1:06 PM ? ? ?

## 2022-05-07 NOTE — Telephone Encounter (Signed)
Fax received ?mdINR PT/INR self testing service ?Test date/time 05/07/22 628 am ?INR 2.8 ?

## 2022-05-13 ENCOUNTER — Encounter: Payer: Self-pay | Admitting: Pulmonary Disease

## 2022-05-13 ENCOUNTER — Ambulatory Visit: Payer: Medicare HMO | Admitting: Pulmonary Disease

## 2022-05-13 VITALS — BP 126/68 | HR 88 | Temp 98.2°F | Ht 69.0 in | Wt 228.0 lb

## 2022-05-13 DIAGNOSIS — J9611 Chronic respiratory failure with hypoxia: Secondary | ICD-10-CM | POA: Diagnosis not present

## 2022-05-13 DIAGNOSIS — J449 Chronic obstructive pulmonary disease, unspecified: Secondary | ICD-10-CM | POA: Diagnosis not present
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
## 2022-05-13 NOTE — Patient Instructions (Signed)
Nice to see you again ? ?Glad you are doing well ? ?No medication changes ? ?Return to clinic in 6 months or sooner as needed ?

## 2022-05-14 ENCOUNTER — Telehealth: Payer: Self-pay | Admitting: *Deleted

## 2022-05-14 DIAGNOSIS — Z86718 Personal history of other venous thrombosis and embolism: Secondary | ICD-10-CM

## 2022-05-14 DIAGNOSIS — Z86711 Personal history of pulmonary embolism: Secondary | ICD-10-CM

## 2022-05-14 NOTE — Telephone Encounter (Signed)
Fax received ?mdINR PT/INR self testing service ?Test date/time 05/14/22 840 am ?INR 2.8 ?

## 2022-05-14 NOTE — Telephone Encounter (Signed)
Patient aware.

## 2022-05-14 NOTE — Telephone Encounter (Signed)
CALLED PATIENT TO INFORM OF CT FOR 05-22-22 - ARRIVAL TIME- 2 PM @ [facility], PATIENT TO HAVE WATER ONLY- 4 HRS. PRIOR TO TEST, PATIENT TO RECEIVE RESULTS FROM ASHLYN BRUNING ON 05-28-22 @ 11 AM VIA TELEPHONE, SPOKE WITH PATIENT'S WIFE- [name] AND SHE IS AWARE OF THESE APPTS. AND THE INSTRUCTIONS ?

## 2022-05-14 NOTE — Telephone Encounter (Signed)
Description   ? continue dose to take half a tablet of the 3 mg tablets every Monday, Wednesday  and Friday and continue 3 mg tablets the rest of the week. ? ?INR  2.8(goal is 2-3),  ?Recheck in 1-2 weeks, checks at home ? ? ? ?  ? ?Caryl Pina, MD ?[place] ?05/14/2022, 11:59 AM ? ? ?

## 2022-05-17 ENCOUNTER — Other Ambulatory Visit: Payer: Self-pay | Admitting: Family Medicine

## 2022-05-17 DIAGNOSIS — E785 Hyperlipidemia, unspecified: Secondary | ICD-10-CM

## 2022-05-21 ENCOUNTER — Telehealth: Payer: Self-pay | Admitting: *Deleted

## 2022-05-21 DIAGNOSIS — Z86711 Personal history of pulmonary embolism: Secondary | ICD-10-CM

## 2022-05-21 DIAGNOSIS — Z86718 Personal history of other venous thrombosis and embolism: Secondary | ICD-10-CM

## 2022-05-21 NOTE — Telephone Encounter (Signed)
Fax received mdINR PT/INR self testing service Test date/time 05/21/22 626 am INR 3.0

## 2022-05-21 NOTE — Telephone Encounter (Signed)
[name] has been informed. She has no concerns at this time.

## 2022-05-21 NOTE — Telephone Encounter (Signed)
Description    continue dose to take half a tablet of the 3 mg tablets every Monday, Wednesday  and Friday and continue 3 mg tablets the rest of the week.  INR  3.0(goal is 2-3),  Recheck in 1-2 weeks, checks at home       Caryl Pina, MD [place] 05/21/2022, 8:38 AM

## 2022-05-22 ENCOUNTER — Ambulatory Visit (HOSPITAL_COMMUNITY)
Admission: RE | Admit: 2022-05-22 | Discharge: 2022-05-22 | Disposition: A | Discharge status: Home / Self Care | Payer: Medicare HMO | Source: Ambulatory Visit | Attending: Urology | Admitting: Urology

## 2022-05-22 DIAGNOSIS — C3431 Malignant neoplasm of lower lobe, right bronchus or lung: Secondary | ICD-10-CM | POA: Diagnosis not present

## 2022-05-22 DIAGNOSIS — R918 Other nonspecific abnormal finding of lung field: Secondary | ICD-10-CM | POA: Diagnosis not present

## 2022-05-22 DIAGNOSIS — C3411 Malignant neoplasm of upper lobe, right bronchus or lung: Secondary | ICD-10-CM | POA: Insufficient documentation

## 2022-05-22 DIAGNOSIS — J439 Emphysema, unspecified: Secondary | ICD-10-CM | POA: Diagnosis not present

## 2022-05-22 MED ORDER — IOHEXOL 300 MG/ML  SOLN
75.0000 mL | Freq: Once | INTRAMUSCULAR | Status: AC | PRN
  Administered 2022-05-22: 75 mL via INTRAVENOUS

## 2022-05-27 ENCOUNTER — Encounter: Payer: Self-pay | Admitting: Urology

## 2022-05-27 NOTE — Progress Notes (Incomplete)
Radiation Oncology         [phone] ________________________________  Name: Travis Cruz MRN: [id number]  Date: 05/28/2022  DOB: [birth year]  Outpatient Follow up Visit  CC: Dettinger, Fransisca Kaufmann, MD  Grace Isaac, MD  Diagnosis:  Putative metachronous stage IA NSCLC of the right upper lobe lung in patient with a history of left upper lobe NSCLC, squamous cell carcinoma, and putative NSCLC of the right lower lung - Clinical stage IA     Interval Since Last Radiation:  2.5 years s/p SBRT to the RUL  10/10/19, 10/14/19, 10/17/19//SBRT:  The RUL lung target was treated to 54 Gy in 3 fractions of 18 Gy  03/23/2017 to 04/02/2017:  The RLL target was treated to 50 Gy in 5 fractions of 10 Gy    11/07/13-11/16/13:  Left upper lobe / 60 Gy in 5 fractions at 12 Gray per fraction  Narrative:  I spoke with the patient's wife, Travis Cruz, due to his significant, progressive dementia, to conduct his routine 6 month follow up visit to review results from recent CT Chest scan performed on 15/25/23 via telephone to spare the patient unnecessary potential exposure in the healthcare setting during the current COVID-19 pandemic.  The patient was notified in advance and gave permission to proceed with this visit format.  He was hospitalized in 02/2020 and again in 11/2020 for acute hypoxic respiratory failure secondary to acute exacerbation of COPD and Covid in 11/2020. He was treated with multiple rounds of antibiotics and steroids which his wife feels has made an improvement in his breathing. He is followed with [facility], c/o Dr. Silas Flood and previously with Dr. Noemi Chapel.  His most recent visit with Dr. Silas Flood was 05/13/22 and his next follow up will be in 6 months.  Since we saw him last in 10/2021, he has remained clinically stable.   His recent CT Chest showed a 2.5 cm lobular nodule in the left upper lobe lung, concerning for recurrent disease.  There is an additional 1.2 cm subpleural  nodule in the left upper lobe lung that could be postinfectious/postinflammatory.  Otherwise, the bilateral radiation fibrosis appears grossly stable.  This was discussed with his wife, Travis Cruz, today.  On review of systems, obtained from wife, Travis Cruz, due to patient's dementia, the patient states that he is doing well and is currently without complaints.  He specifically denies dysphagia, chest pain, productive cough, hemoptysis, fever, chills, N/V.  He reports a decent appetite and is working on maintaining his weight.  His energy level is low but stable.  He continues on 3-4 L of oxygen via nasal cannula 24/7.  He denies fever, chills or night sweats.  ALLERGIES:  is allergic to ativan [lorazepam], cefuroxime axetil, and zinacef [cefuroxime].  Meds: Current Outpatient Medications  Medication Sig Dispense Refill   acetaminophen (TYLENOL) 325 MG tablet Take 2 tablets (650 mg total) by mouth every 6 (six) hours as needed for mild pain, fever or headache (or Fever >/= 101). 12 tablet 9   albuterol (VENTOLIN HFA) 108 (90 Base) MCG/ACT inhaler Inhale 2 puffs into the lungs every 4 (four) hours as needed for wheezing or shortness of breath. 18 g 11   ascorbic acid (VITAMIN C) 500 MG tablet Take 1 tablet (500 mg total) by mouth daily. 30 tablet 0   atorvastatin (LIPITOR) 40 MG tablet TAKE 1 TABLET (40 MG TOTAL) BY MOUTH EVERY MONDAY, WEDNESDAY, AND FRIDAY. 45 tablet 1   azithromycin (ZITHROMAX) 250 MG tablet Take 1  tablet (250 mg total) by mouth 3 (three) times a week. Monday Wednesday Friday. 30 tablet 4   Cholecalciferol (VITAMIN D3) 2000 units TABS Take 2,000 Units by mouth daily.      finasteride (PROSCAR) 5 MG tablet Take 1 tablet (5 mg total) by mouth daily. 90 tablet 3   Fluticasone-Umeclidin-Vilant (TRELEGY ELLIPTA) 100-62.5-25 MCG/INH AEPB Inhale 1 puff into the lungs daily. 60 each 11   furosemide (LASIX) 20 MG tablet TAKE 2 TABLETS BY MOUTH EVERY DAY 180 tablet 1   metoprolol tartrate (LOPRESSOR)  25 MG tablet TAKE 1.5 TABLETS (37.5 MG TOTAL) BY MOUTH TWICE A DAY 270 tablet 0   MODERNA COVID-19 BIVAL BOOSTER 50 MCG/0.5ML injection      Multiple Vitamin (MULTIVITAMIN WITH MINERALS) TABS tablet Take 1 tablet by mouth daily.     Multiple Vitamins-Minerals (PRESERVISION AREDS PO) Take 1 capsule by mouth 2 (two) times daily.     OXYGEN Inhale 3 L/L into the lungs.     sertraline (ZOLOFT) 100 MG tablet TAKE 1 TABLET BY MOUTH EVERY DAY 90 tablet 1   tamsulosin (FLOMAX) 0.4 MG CAPS capsule Take 1 capsule (0.4 mg total) by mouth daily after supper. 90 capsule 1   warfarin (COUMADIN) 3 MG tablet Take 1/2 tab on Mon Wednesday & Fri, and 1 tab (3 mg) on all other days 90 tablet 1   No current facility-administered medications for this encounter.    Physical Findings:  vitals were not taken for this visit.  Pain Assessment Pain Score: 2 /Unable to assess due to telephone follow-up visit format.  Lab Findings: Lab Results  Component Value Date   WBC 14.9 (H) 05/01/2022   HGB 16.1 05/01/2022   HCT 49.3 05/01/2022   MCV 88 05/01/2022   PLT 325 05/01/2022     Radiographic Findings: CT Chest W Contrast  Result Date: 05/24/2022 CLINICAL DATA:  History of small cell cancer. Restaging. * Tracking Code: BO * EXAM: CT CHEST WITH CONTRAST TECHNIQUE: Multidetector CT imaging of the chest was performed during intravenous contrast administration. RADIATION DOSE REDUCTION: This exam was performed according to the departmental dose-optimization program which includes automated exposure control, adjustment of the mA and/or kV according to patient size and/or use of iterative reconstruction technique. CONTRAST:  34mL OMNIPAQUE IOHEXOL 300 MG/ML  SOLN COMPARISON:  CT chest 11/21/2021. FINDINGS: Cardiovascular: Heart is enlarged. Ascending thoracic aorta measures 4.1 cm. Coronary arterial and thoracic aortic vascular calcifications. AICD device. Mediastinum/Nodes: No enlarged axillary, mediastinal or hilar  lymphadenopathy. Normal appearance of the esophagus. Lungs/Pleura: Expiratory phase of imaging. Redemonstrated postradiation changes within the left upper lobe and right upper lobe. Similar-appearing bilateral lower lobe patchy ground-glass and consolidative opacities. No definite pleural effusion or pneumothorax. Within the anterior left upper lobe there is a 2.5 x 2.5 cm nodule (image 53; series 4). Additionally within the left upper lobe there is a 1.2 cm irregular nodule (image 75; series 5). Extensive emphysematous change. Upper Abdomen: No acute process. Musculoskeletal: Thoracic spine degenerative changes. No aggressive or acute appearing osseous lesions. Similar T6 vertebra plana. Similar T10 compression deformity. Scattered stable sebaceous cyst. IMPRESSION: 1. There is a 2.5 cm lobular nodule within the left upper lobe concerning for recurrent disease. 2. There is an additional 1.2 cm nodule within the subpleural left upper lobe which may be postinfectious/inflammatory in etiology. Malignant process not excluded. 3. The bandlike regions of radiation fibrosis within the lungs bilaterally are otherwise grossly stable in appearance. 4. Cardiomegaly. 5. Aortic Atherosclerosis (ICD10-I70.0) and  Emphysema (ICD10-J43.9). 6. These results will be called to the ordering clinician or representative by the Radiologist Assistant, and communication documented in the PACS or [employer]. Electronically Signed   By: Lovey Newcomer M.D.   On: 05/24/2022 15:31    Impression/Plan: 1. 76 y/o male with putative stage IA NSCLC in the right upper lobe lung and a history of left upper lobe NSCLC, squamous cell carcinoma, and putative NSCLC of the right lower lung - Clinical stage IA. He has recovered well from the effects of his previous radiotherapy and remains without complaints aside from his chronic shortness of breath with exertion related to his COPD.  His recent CT chest shows a 2.5 cm lobular nodule in the left  upper lobe lung, concerning for recurrent disease as well as an additional 1.2 cm subpleural nodule in the left upper lobe lung that could be postinfectious/postinflammatory.  We discussed the recommendation for PET scan for further evaluation of the left upper lobe nodules.  We will arrange for this scan to be performed at [facility] for convenience, with a follow-up visit or phone call thereafter to review results and recommendations.  They are comfortable with and in agreement with this plan and know to call at any time with any questions or concerns in the interim. I will forward a copy of my note today and recent scan results to Dr. Warrick Parisian, Dr. Silas Flood and Dr. Caryl Comes for their records as well.    Given current concerns for patient exposure during the COVID-19 pandemic, this encounter was conducted via telephone. The patient and his wife, Travis Cruz, were notified in advance and offered a [facility] meeting to allow for face to face communication but unfortunately reported that they did not have the appropriate resources/technology to support such a visit and instead preferred to proceed with telephone follow up. The patient/wife has given verbal consent for this type of encounter. The time spent during this encounter was 25 minutes. The attendants for this meeting include Travis Hershberger PA-C, patient, Travis Cruz and his wife, Travis Cruz. During the encounter, Lee-Ann Gal PA-C, was located at [facility] Radiation Oncology Department.  Patient, Travis Cruz and his wife, Travis Cruz, were located at home.   Travis Johns, PA-C    Tyler Pita, MD  [facility] Direct Dial: [phone]  Fax: [phone] [email or web address]  Skype  LinkedIn

## 2022-05-27 NOTE — Progress Notes (Signed)
Telephone appointment. I spoke w/ patient's spouse Travis Cruz, verified her identity and began nursing interview. She reports that patient [name] is having some occasional back pain 2/10 and some SOB w/ exertion. No other issues reported at this time.  Meaningful use complete.  Reminded spouse of patient's 11:00am-05/28/22 telephone appointment w/ Ashlyn Bruning PA-C. I left my extension [phone] in case patient needs anything. [name] verbalized understanding.   Patient preferred contact [phone]

## 2022-05-28 ENCOUNTER — Ambulatory Visit
Admission: RE | Admit: 2022-05-28 | Discharge: 2022-05-28 | Disposition: A | Discharge status: Home / Self Care | Payer: Medicare HMO | Source: Ambulatory Visit | Attending: Urology | Admitting: Urology

## 2022-05-28 ENCOUNTER — Telehealth: Payer: Self-pay | Admitting: *Deleted

## 2022-05-28 DIAGNOSIS — Z7901 Long term (current) use of anticoagulants: Secondary | ICD-10-CM | POA: Diagnosis not present

## 2022-05-28 DIAGNOSIS — C3411 Malignant neoplasm of upper lobe, right bronchus or lung: Secondary | ICD-10-CM

## 2022-05-28 DIAGNOSIS — C3431 Malignant neoplasm of lower lobe, right bronchus or lung: Secondary | ICD-10-CM

## 2022-05-28 DIAGNOSIS — Z86718 Personal history of other venous thrombosis and embolism: Secondary | ICD-10-CM | POA: Diagnosis not present

## 2022-05-28 DIAGNOSIS — Z86711 Personal history of pulmonary embolism: Secondary | ICD-10-CM

## 2022-05-28 DIAGNOSIS — R918 Other nonspecific abnormal finding of lung field: Secondary | ICD-10-CM

## 2022-05-28 NOTE — Progress Notes (Signed)
Radiation Oncology         [phone] ________________________________  Name: Travis Cruz MRN: [id number]  Date: 05/28/2022  DOB: January 09, 1946  Outpatient Follow up Visit  CC: Dettinger, Travis Kaufmann, MD  Travis Isaac, MD  Diagnosis:  Putative metachronous stage IA NSCLC of the right upper lobe lung in patient with a history of left upper lobe NSCLC, squamous cell carcinoma, and putative NSCLC of the right lower lung - Clinical stage IA     Interval Since Last Radiation:  2.5 years s/p SBRT to the RUL  10/10/19, 10/14/19, 10/17/19//SBRT:  The RUL lung target was treated to 54 Gy in 3 fractions of 18 Gy  03/23/2017 to 04/02/2017:  The RLL target was treated to 50 Gy in 5 fractions of 10 Gy    11/07/13-11/16/13:  Left upper lobe / 60 Gy in 5 fractions at 12 Gray per fraction  Narrative:  I spoke with the patient's wife, Travis Cruz, due to his significant, progressive dementia, to conduct his routine 6 month follow up visit to review results from recent CT Chest scan performed on 05/22/22 via telephone to spare the patient unnecessary potential exposure in the healthcare setting during the current COVID-19 pandemic.  The patient was notified in advance and gave permission to proceed with this visit format.  In summary, he has a history of  NSCLC, squamous cell carcinoma in the left upper lobe treated with a 5 fraction course of SBRT in 2014.  He tolerated this treatment well and remained disease-free on follow-up CT chest scans until 2018 when he was noted to have a new lesion in the right lower lobe lung, presumed to be a new, stage I NSCLC.due to his significant underlying COPD, he was not felt to be a good candidate for biopsy so the decision was made to treat this lesion with a 5 fraction course of SBRT, completed in 03/2017.  He again tolerated this treatment well and remained disease-free until September 2020 when he was noted to have a new, 1.8 cm solid nodule in the posterior right upper  lobe lung, suspicious for metastasis or metachronous primary bronchogenic carcinoma.  There was no thoracic adenopathy or other potential findings of metastatic disease in the chest.  PET scan was completed on 09/26/19 and demonstrated hypermetabolic activity in the posterior right upper lobe 1.8 cm pulmonary nodule compatible with pulmonary malignancy. Fortunately, there was no hypermetabolic thoracic adenopathy or distant metastatic disease and no hypermetabolic recurrence at the sites of previous radiation therapy in the perihilar left upper and right lower lung lobes.  Again, he was not felt to be a surgical candidate and biopsy was felt to be high risk given his underlying severe COPD, so the decision was to proceed with a 3 fraction course of SBRT to the RUL lung nodule, completed in October 2020.  Thereafter, he continued in routine surveillance with serial CT chest scans every 3-6 months without any evidence of disease recurrence.  He was hospitalized in 02/2020 and again in 11/2020 for acute hypoxic respiratory failure secondary to acute exacerbation of COPD and Covid in 11/2020. He was treated with multiple rounds of antibiotics and steroids which his wife feels has made an improvement in his breathing. He is followed with Travis Cruz pulmonology, c/o Travis Cruz and previously with Travis Cruz.  His most recent visit with Travis Cruz was 05/13/22 and his next follow up will be in 6 months.  Since we saw him last in 10/2021, he has remained  clinically stable.   His most recent CT Chest from 05/22/2022 showed a 2.5 cm lobular nodule in the left upper lobe lung, concerning for recurrent disease.  There is an additional 1.2 cm subpleural nodule in the left upper lobe lung that could be postinfectious/postinflammatory.  Otherwise, the bilateral radiation fibrosis appears grossly stable.  This was discussed with his wife, Travis Cruz, today.  On review of systems, obtained from wife, Travis Cruz, due to patient's  dementia, the patient states that he is doing fair and is currently without any new respiratory complaints.  She reports that his cognition and performance status have continued to decline gradually.  He specifically denies dysphagia, chest pain, productive cough, hemoptysis, fever, chills, N/V.  He reports a decent appetite and is working on maintaining his weight.  His energy level is low but overall stable.  He continues on 3-4 L of oxygen via nasal cannula 24/7.  He denies fever, chills or night sweats.  ALLERGIES:  is allergic to ativan [lorazepam], cefuroxime axetil, and zinacef [cefuroxime].  Meds: Current Outpatient Medications  Medication Sig Dispense Refill   acetaminophen (TYLENOL) 325 MG tablet Take 2 tablets (650 mg total) by mouth every 6 (six) hours as needed for mild pain, fever or headache (or Fever >/= 101). 12 tablet 9   albuterol (VENTOLIN HFA) 108 (90 Base) MCG/ACT inhaler Inhale 2 puffs into the lungs every 4 (four) hours as needed for wheezing or shortness of breath. 18 g 11   ascorbic acid (VITAMIN C) 500 MG tablet Take 1 tablet (500 mg total) by mouth daily. 30 tablet 0   atorvastatin (LIPITOR) 40 MG tablet TAKE 1 TABLET (40 MG TOTAL) BY MOUTH EVERY MONDAY, WEDNESDAY, AND FRIDAY. 45 tablet 1   azithromycin (ZITHROMAX) 250 MG tablet Take 1 tablet (250 mg total) by mouth 3 (three) times a week. Monday Wednesday Friday. 30 tablet 4   Cholecalciferol (VITAMIN D3) 2000 units TABS Take 2,000 Units by mouth daily.      finasteride (PROSCAR) 5 MG tablet Take 1 tablet (5 mg total) by mouth daily. 90 tablet 3   Fluticasone-Umeclidin-Vilant (TRELEGY ELLIPTA) 100-62.5-25 MCG/INH AEPB Inhale 1 puff into the lungs daily. 60 each 11   furosemide (LASIX) 20 MG tablet TAKE 2 TABLETS BY MOUTH EVERY DAY 180 tablet 1   metoprolol tartrate (LOPRESSOR) 25 MG tablet TAKE 1.5 TABLETS (37.5 MG TOTAL) BY MOUTH TWICE A DAY 270 tablet 0   MODERNA COVID-19 BIVAL BOOSTER 50 MCG/0.5ML injection       Multiple Vitamin (MULTIVITAMIN WITH MINERALS) TABS tablet Take 1 tablet by mouth daily.     Multiple Vitamins-Minerals (PRESERVISION AREDS PO) Take 1 capsule by mouth 2 (two) times daily.     OXYGEN Inhale 3 L/L into the lungs.     sertraline (ZOLOFT) 100 MG tablet TAKE 1 TABLET BY MOUTH EVERY DAY 90 tablet 1   tamsulosin (FLOMAX) 0.4 MG CAPS capsule Take 1 capsule (0.4 mg total) by mouth daily after supper. 90 capsule 1   warfarin (COUMADIN) 3 MG tablet Take 1/2 tab on Mon Wednesday & Fri, and 1 tab (3 mg) on all other days 90 tablet 1   No current facility-administered medications for this encounter.    Physical Findings:  vitals were not taken for this visit.   /Unable to assess due to telephone follow-up visit format.  Lab Findings: Lab Results  Component Value Date   WBC 14.9 (H) 05/01/2022   HGB 16.1 05/01/2022   HCT 49.3 05/01/2022   MCV  88 05/01/2022   PLT 325 05/01/2022     Radiographic Findings: CT Chest W Contrast  Result Date: 05/24/2022 CLINICAL DATA:  History of small cell cancer. Restaging. * Tracking Code: BO * EXAM: CT CHEST WITH CONTRAST TECHNIQUE: Multidetector CT imaging of the chest was performed during intravenous contrast administration. RADIATION DOSE REDUCTION: This exam was performed according to the departmental dose-optimization program which includes automated exposure control, adjustment of the mA and/or kV according to patient size and/or use of iterative reconstruction technique. CONTRAST:  16mL OMNIPAQUE IOHEXOL 300 MG/ML  SOLN COMPARISON:  CT chest 11/21/2021. FINDINGS: Cardiovascular: Heart is enlarged. Ascending thoracic aorta measures 4.1 cm. Coronary arterial and thoracic aortic vascular calcifications. AICD device. Mediastinum/Nodes: No enlarged axillary, mediastinal or hilar lymphadenopathy. Normal appearance of the esophagus. Lungs/Pleura: Expiratory phase of imaging. Redemonstrated postradiation changes within the left upper lobe and right upper  lobe. Similar-appearing bilateral lower lobe patchy ground-glass and consolidative opacities. No definite pleural effusion or pneumothorax. Within the anterior left upper lobe there is a 2.5 x 2.5 cm nodule (image 53; series 4). Additionally within the left upper lobe there is a 1.2 cm irregular nodule (image 75; series 5). Extensive emphysematous change. Upper Abdomen: No acute process. Musculoskeletal: Thoracic spine degenerative changes. No aggressive or acute appearing osseous lesions. Similar T6 vertebra plana. Similar T10 compression deformity. Scattered stable sebaceous cyst. IMPRESSION: 1. There is a 2.5 cm lobular nodule within the left upper lobe concerning for recurrent disease. 2. There is an additional 1.2 cm nodule within the subpleural left upper lobe which may be postinfectious/inflammatory in etiology. Malignant process not excluded. 3. The bandlike regions of radiation fibrosis within the lungs bilaterally are otherwise grossly stable in appearance. 4. Cardiomegaly. 5. Aortic Atherosclerosis (ICD10-I70.0) and Emphysema (ICD10-J43.9). 6. These results will be called to the ordering clinician or representative by the Radiologist Assistant, and communication documented in the PACS or [employer]. Electronically Signed   By: Travis Cruz M.D.   On: 05/24/2022 15:31    Impression/Plan: 1. 76 y/o male with putative stage IA NSCLC in the right upper lobe lung and a history of left upper lobe NSCLC, squamous cell carcinoma, and putative NSCLC of the right lower lung - Clinical stage IA. He has recovered well from the effects of his previous radiotherapy and remains without complaints aside from his chronic shortness of breath with exertion related to his COPD.  His recent CT chest shows a 2.5 cm lobular nodule in the left upper lobe lung, concerning for recurrent disease as well as an additional 1.2 cm subpleural nodule in the left upper lobe lung that could be postinfectious/postinflammatory.  We  discussed the recommendation for PET scan for further evaluation of the left upper lobe nodules.  We will arrange for this scan to be performed at [facility] for convenience, with a follow-up visit or phone call thereafter to review results and recommendations.  They are comfortable with and in agreement with this plan and know to call at any time with any questions or concerns in the interim. I will forward a copy of my note today and recent scan results to Travis Cruz, Travis Cruz and Travis Cruz to keep them informed as well.    Given current concerns for patient exposure during the COVID-19 pandemic, this encounter was conducted via telephone. The patient and his wife, Travis Cruz, were notified in advance and offered a [place] meeting to allow for face to face communication but unfortunately reported that they did not have  the appropriate resources/technology to support such a visit and instead preferred to proceed with telephone follow up. The patient/wife has given verbal consent for this type of encounter. The time spent during this encounter was 25 minutes. The attendants for this meeting include Travis Cruz, patient, Travis Cruz and his wife, Travis Cruz. During the encounter, Travis Cruz, was located at [facility] Radiation Oncology Department.  Patient, Travis Cruz and his wife, Travis Cruz, were located at home.   Nicholos Johns, Cruz    Travis Pita, MD  [facility] Direct Dial: [phone]  Fax: [phone] [email or web address]  Skype  LinkedIn

## 2022-05-28 NOTE — Telephone Encounter (Signed)
Fax received mdINR PT/INR self testing service Test date/time 05/28/22 619 am INR 2.8

## 2022-05-28 NOTE — Telephone Encounter (Signed)
Description    continue dose to take half a tablet of the 3 mg tablets every Monday, Wednesday  and Friday and continue 3 mg tablets the rest of the week.  INR  2.8(goal is 2-3),  Recheck in 1-2 weeks, checks at home       Caryl Pina, MD [place] 05/28/2022, 9:11 AM

## 2022-05-28 NOTE — Telephone Encounter (Signed)
CALLED PATIENT TO INFORM OF PET SCAN FOR 06-12-22- ARRIVAL TIME- 8:45 AM @ [facility], PATIENT TO BE NPO- 6 HRS. PRIOR TO TEST, PATIENT TO RECEIVE RESULTS FROM ASHLYN BRUNING ON 06-20-22 @ 9 AM, VIA TELEPHONE, SPOKE WITH PATIENT'S WIFE [name] AND SHE IS AWARE OF THESE APPTS.

## 2022-05-28 NOTE — Telephone Encounter (Signed)
Wife aware and verbalizes understanding per dpr.  °

## 2022-06-04 ENCOUNTER — Telehealth: Payer: Self-pay | Admitting: *Deleted

## 2022-06-04 DIAGNOSIS — Z86711 Personal history of pulmonary embolism: Secondary | ICD-10-CM

## 2022-06-04 DIAGNOSIS — Z86718 Personal history of other venous thrombosis and embolism: Secondary | ICD-10-CM

## 2022-06-04 DIAGNOSIS — J449 Chronic obstructive pulmonary disease, unspecified: Secondary | ICD-10-CM | POA: Diagnosis not present

## 2022-06-04 NOTE — Telephone Encounter (Signed)
Travis Cruz informed and understood. She has no concerns.

## 2022-06-04 NOTE — Telephone Encounter (Signed)
Fax received mdINR PT/INR self testing service Test date/time 06/04/22 603 am INR 2.9

## 2022-06-04 NOTE — Telephone Encounter (Signed)
Description    continue dose to take half a tablet of the 3 mg tablets every Monday, Wednesday  and Friday and continue 3 mg tablets the rest of the week.  INR  2.9(goal is 2-3),  Recheck in 1-2 weeks, checks at home       Caryl Pina, MD [place] 06/04/2022, 8:23 AM

## 2022-06-10 DIAGNOSIS — G4733 Obstructive sleep apnea (adult) (pediatric): Secondary | ICD-10-CM | POA: Diagnosis not present

## 2022-06-12 ENCOUNTER — Ambulatory Visit (HOSPITAL_COMMUNITY)
Admission: RE | Admit: 2022-06-12 | Discharge: 2022-06-12 | Disposition: A | Discharge status: Home / Self Care | Payer: Medicare HMO | Source: Ambulatory Visit | Attending: Urology | Admitting: Urology

## 2022-06-12 ENCOUNTER — Telehealth: Payer: Self-pay

## 2022-06-12 ENCOUNTER — Telehealth: Payer: Self-pay | Admitting: *Deleted

## 2022-06-12 ENCOUNTER — Encounter: Payer: Self-pay | Admitting: Urology

## 2022-06-12 DIAGNOSIS — Z86711 Personal history of pulmonary embolism: Secondary | ICD-10-CM

## 2022-06-12 DIAGNOSIS — R918 Other nonspecific abnormal finding of lung field: Secondary | ICD-10-CM

## 2022-06-12 DIAGNOSIS — Z86718 Personal history of other venous thrombosis and embolism: Secondary | ICD-10-CM

## 2022-06-12 DIAGNOSIS — C3431 Malignant neoplasm of lower lobe, right bronchus or lung: Secondary | ICD-10-CM

## 2022-06-12 DIAGNOSIS — C3411 Malignant neoplasm of upper lobe, right bronchus or lung: Secondary | ICD-10-CM

## 2022-06-12 NOTE — Telephone Encounter (Signed)
Pt aware of provider feedback.

## 2022-06-12 NOTE — Telephone Encounter (Signed)
CALLED PATIENT TO INFORM OF PET SCAN FOR 06-18-22- ARRIVAL TIME- 12:30 PM @ [facility], PATIENT TO HAVE WATER ONLY- 6 HRS. PRIOR TO TEST, PATIENT TO RECEIVE RESULTS FROM [facility] BRUNING ON 06-20-22 @ 9 AM , VIA TELEPHONE, SPOKE WITH PATIENT'S WIFE [name] AND SHE IS AWARE OF THIS SCAN  AND THE INSTRUCTIONS

## 2022-06-12 NOTE — Telephone Encounter (Signed)
Continue coumadin as is.

## 2022-06-12 NOTE — Telephone Encounter (Signed)
Spoke wife wife was concerned about not be able to get patient PET scan done at [facility] due to equipment being mobile unit.  Will need to reschedule at [facility].  Spoke with nurse secretary and [name], PA-C about rescheduling.

## 2022-06-12 NOTE — Telephone Encounter (Signed)
Fax received mdINR PT/INR self testing service Test date/time 06/11/22 445 pm INR 2.9

## 2022-06-18 ENCOUNTER — Telehealth: Payer: Self-pay | Admitting: *Deleted

## 2022-06-18 ENCOUNTER — Ambulatory Visit (HOSPITAL_COMMUNITY)
Admission: RE | Admit: 2022-06-18 | Discharge: 2022-06-18 | Disposition: A | Discharge status: Home / Self Care | Payer: Medicare HMO | Source: Ambulatory Visit | Attending: Urology | Admitting: Urology

## 2022-06-18 DIAGNOSIS — R918 Other nonspecific abnormal finding of lung field: Secondary | ICD-10-CM | POA: Insufficient documentation

## 2022-06-18 DIAGNOSIS — Z86718 Personal history of other venous thrombosis and embolism: Secondary | ICD-10-CM

## 2022-06-18 DIAGNOSIS — J432 Centrilobular emphysema: Secondary | ICD-10-CM | POA: Diagnosis not present

## 2022-06-18 DIAGNOSIS — Z86711 Personal history of pulmonary embolism: Secondary | ICD-10-CM

## 2022-06-18 DIAGNOSIS — C3431 Malignant neoplasm of lower lobe, right bronchus or lung: Secondary | ICD-10-CM | POA: Diagnosis not present

## 2022-06-18 DIAGNOSIS — J9811 Atelectasis: Secondary | ICD-10-CM | POA: Diagnosis not present

## 2022-06-18 DIAGNOSIS — C3490 Malignant neoplasm of unspecified part of unspecified bronchus or lung: Secondary | ICD-10-CM | POA: Diagnosis not present

## 2022-06-18 DIAGNOSIS — C3411 Malignant neoplasm of upper lobe, right bronchus or lung: Secondary | ICD-10-CM | POA: Diagnosis not present

## 2022-06-18 LAB — GLUCOSE, CAPILLARY: Glucose-Capillary: 100 mg/dL — ABNORMAL HIGH (ref 70–99)

## 2022-06-18 MED ORDER — FLUDEOXYGLUCOSE F - 18 (FDG) INJECTION
11.4000 | Freq: Once | INTRAVENOUS | Status: AC
  Administered 2022-06-18: 11.4 via INTRAVENOUS

## 2022-06-18 NOTE — Telephone Encounter (Signed)
Fax received mdINR PT/INR self testing service Test date/time 06/18/22 739 am INR 3.4

## 2022-06-18 NOTE — Telephone Encounter (Signed)
Patient's wife aware of instruction.

## 2022-06-18 NOTE — Telephone Encounter (Signed)
Description   Hold for 1 day and then continue dose to take half a tablet of the 3 mg tablets every Monday, Wednesday  and Friday and continue 3 mg tablets the rest of the week.  INR  3.4(goal is 2-3),  Recheck in 1-2 weeks, checks at home       Caryl Pina, MD [facility] 06/18/2022, 10:12 AM

## 2022-06-19 ENCOUNTER — Encounter: Payer: Self-pay | Admitting: Urology

## 2022-06-19 NOTE — Progress Notes (Signed)
Telephone appointment. I spoke w/ patient's spouse Mrs. Marguerite Olea, verified her identity and began nursing interview. She reports that patient has some mild SOB w/ exertion and occasional, bilateral hip pain. No other issues reported at this time.  Meaningful use complete.  Reminded spouse of patient's 9:00am-06/20/22 telephone appointment w/ Ashlyn Bruning PA-C. I left my extension [phone] in case patient needs anything. Mrs. Glace verbalized understanding.  Patient contact [phone]

## 2022-06-20 ENCOUNTER — Encounter: Payer: Self-pay | Admitting: Internal Medicine

## 2022-06-20 ENCOUNTER — Ambulatory Visit
Admission: RE | Admit: 2022-06-20 | Discharge: 2022-06-20 | Disposition: A | Discharge status: Home / Self Care | Payer: Medicare HMO | Source: Ambulatory Visit | Attending: Urology | Admitting: Urology

## 2022-06-20 DIAGNOSIS — C3412 Malignant neoplasm of upper lobe, left bronchus or lung: Secondary | ICD-10-CM

## 2022-06-20 DIAGNOSIS — C3411 Malignant neoplasm of upper lobe, right bronchus or lung: Secondary | ICD-10-CM | POA: Diagnosis not present

## 2022-06-23 ENCOUNTER — Ambulatory Visit
Admission: RE | Admit: 2022-06-23 | Discharge: 2022-06-23 | Disposition: A | Discharge status: Home / Self Care | Payer: Medicare HMO | Source: Ambulatory Visit | Attending: Urology | Admitting: Urology

## 2022-06-23 DIAGNOSIS — C3412 Malignant neoplasm of upper lobe, left bronchus or lung: Secondary | ICD-10-CM | POA: Diagnosis not present

## 2022-06-23 DIAGNOSIS — C3411 Malignant neoplasm of upper lobe, right bronchus or lung: Secondary | ICD-10-CM | POA: Diagnosis not present

## 2022-06-23 NOTE — Progress Notes (Addendum)
  Radiation Oncology         [phone] ________________________________  Name: Travis Cruz MRN: [id number]  Date: 06/23/2022  DOB: 1946/04/07  STEREOTACTIC BODY RADIOTHERAPY SIMULATION AND TREATMENT PLANNING NOTE    ICD-10-CM   1. Primary cancer of left upper lobe of lung (HCC)  C34.12       DIAGNOSIS:  Putative Metachronous Stage IA NSCLC of the left upper lobe lung in patient with a history of putative NSCLC of the right upper lobe, left upper lobe NSCLC, squamous cell carcinoma, and putative NSCLC of the right lower lung - Clinical stage IA     NARRATIVE:  The patient was brought to the [facility].  Identity was confirmed.  All relevant records and images related to the planned course of therapy were reviewed.  The patient freely provided informed written consent to proceed with treatment after reviewing the details related to the planned course of therapy. The consent form was witnessed and verified by the simulation staff.  Then, the patient was set-up in a stable reproducible  supine position for radiation therapy.  A BodyFix immobilization pillow was fabricated for reproducible positioning.  Surface markings were placed.  The CT images were loaded into the planning software.  The gross target volumes (GTV) and planning target volumes (PTV) were delinieated, and avoidance structures were contoured.  Treatment planning then occurred.  The radiation prescription was entered and confirmed.  A total of two complex treatment devices were fabricated in the form of the BodyFix immobilization pillow and a neck accuform cushion.  I have requested : 3D Simulation  I have requested a DVH of the following structures: targets and all normal structures near the target including left lung, right lung, spinal cord, rib cage and others as noted on the radiation plan to maintain doses in adherence with established limits  SPECIAL TREATMENT PROCEDURE:  The planned course of therapy  using radiation constitutes a special treatment procedure. Special care is required in the management of this patient for the following reasons. High dose per fraction requiring special monitoring for increased toxicities of treatment including daily imaging..  The special nature of the planned course of radiotherapy will require increased physician supervision and oversight to ensure patient's safety with optimal treatment outcomes.    This requires extended time and effort.    PLAN:  The patient will receive 60 Gy in 5 fractions.  ________________________________  Sheral Apley Tammi Klippel, M.D.

## 2022-06-25 ENCOUNTER — Telehealth: Payer: Self-pay | Admitting: *Deleted

## 2022-06-25 DIAGNOSIS — Z86718 Personal history of other venous thrombosis and embolism: Secondary | ICD-10-CM | POA: Diagnosis not present

## 2022-06-25 DIAGNOSIS — Z86711 Personal history of pulmonary embolism: Secondary | ICD-10-CM

## 2022-06-25 DIAGNOSIS — Z7901 Long term (current) use of anticoagulants: Secondary | ICD-10-CM | POA: Diagnosis not present

## 2022-06-25 NOTE — Telephone Encounter (Signed)
Fax received mdINR PT/INR self testing service Test date/time 06/25/22 629 am INR 2.9

## 2022-06-25 NOTE — Telephone Encounter (Signed)
[name] made aware. She has no concerns at this time.

## 2022-06-25 NOTE — Telephone Encounter (Signed)
Description   Hold for 1 day and then continue dose to take half a tablet of the 3 mg tablets every Monday, Wednesday  and Friday and continue 3 mg tablets the rest of the week.  INR  2.9(goal is 2-3),  Recheck in 1-2 weeks, checks at home       Caryl Pina, MD [facility] 06/25/2022, 4:42 PM

## 2022-07-02 ENCOUNTER — Telehealth: Payer: Self-pay | Admitting: *Deleted

## 2022-07-02 DIAGNOSIS — C3412 Malignant neoplasm of upper lobe, left bronchus or lung: Secondary | ICD-10-CM | POA: Insufficient documentation

## 2022-07-02 DIAGNOSIS — C3411 Malignant neoplasm of upper lobe, right bronchus or lung: Secondary | ICD-10-CM | POA: Diagnosis not present

## 2022-07-02 DIAGNOSIS — Z86718 Personal history of other venous thrombosis and embolism: Secondary | ICD-10-CM

## 2022-07-02 DIAGNOSIS — Z86711 Personal history of pulmonary embolism: Secondary | ICD-10-CM

## 2022-07-02 NOTE — Telephone Encounter (Signed)
Fax received mdINR PT/INR self testing service Test date/time 07/02/22 1226 pm INR 2.7

## 2022-07-02 NOTE — Telephone Encounter (Signed)
Description   Hold for 1 day and then continue dose to take half a tablet of the 3 mg tablets every Monday, Wednesday  and Friday and continue 3 mg tablets the rest of the week.  INR  2.7(goal is 2-3),  Recheck in 1-2 weeks, checks at home       Caryl Pina, MD [facility] 07/02/2022, 4:22 PM

## 2022-07-02 NOTE — Telephone Encounter (Signed)
Travis Cruz has been informed and understood. She has no concerns at this time.

## 2022-07-03 ENCOUNTER — Ambulatory Visit (INDEPENDENT_AMBULATORY_CARE_PROVIDER_SITE_OTHER): Payer: Medicare HMO

## 2022-07-03 DIAGNOSIS — C3411 Malignant neoplasm of upper lobe, right bronchus or lung: Secondary | ICD-10-CM | POA: Diagnosis not present

## 2022-07-03 DIAGNOSIS — I255 Ischemic cardiomyopathy: Secondary | ICD-10-CM | POA: Diagnosis not present

## 2022-07-03 LAB — CUP PACEART REMOTE DEVICE CHECK
Battery Remaining Longevity: 14 mo
Battery Remaining Percentage: 14 %
Battery Voltage: 2.72 V
Brady Statistic RV Percent Paced: 1 %
Date Time Interrogation Session: 20230706020015
HighPow Impedance: 93 Ohm
HighPow Impedance: 93 Ohm
Implantable Lead Implant Date: 20140205
Implantable Lead Location: 753860
Implantable Lead Model: 181
Implantable Lead Serial Number: 323859
Implantable Pulse Generator Implant Date: 20140205
Lead Channel Impedance Value: 450 Ohm
Lead Channel Pacing Threshold Amplitude: 1 V
Lead Channel Pacing Threshold Pulse Width: 0.5 ms
Lead Channel Sensing Intrinsic Amplitude: 11.8 mV
Lead Channel Setting Pacing Amplitude: 2.5 V
Lead Channel Setting Pacing Pulse Width: 0.5 ms
Lead Channel Setting Sensing Sensitivity: 0.5 mV
Pulse Gen Serial Number: 7040910

## 2022-07-04 DIAGNOSIS — J449 Chronic obstructive pulmonary disease, unspecified: Secondary | ICD-10-CM | POA: Diagnosis not present

## 2022-07-09 ENCOUNTER — Ambulatory Visit
Admission: RE | Admit: 2022-07-09 | Discharge: 2022-07-09 | Disposition: A | Discharge status: Home / Self Care | Payer: Medicare HMO | Source: Ambulatory Visit | Attending: Urology | Admitting: Urology

## 2022-07-09 ENCOUNTER — Telehealth: Payer: Self-pay | Admitting: *Deleted

## 2022-07-09 ENCOUNTER — Other Ambulatory Visit: Payer: Self-pay

## 2022-07-09 ENCOUNTER — Inpatient Hospital Stay: Payer: Medicare HMO | Attending: Radiation Oncology

## 2022-07-09 DIAGNOSIS — C3412 Malignant neoplasm of upper lobe, left bronchus or lung: Secondary | ICD-10-CM | POA: Diagnosis not present

## 2022-07-09 DIAGNOSIS — C3411 Malignant neoplasm of upper lobe, right bronchus or lung: Secondary | ICD-10-CM | POA: Diagnosis not present

## 2022-07-09 DIAGNOSIS — Z86718 Personal history of other venous thrombosis and embolism: Secondary | ICD-10-CM

## 2022-07-09 DIAGNOSIS — Z86711 Personal history of pulmonary embolism: Secondary | ICD-10-CM

## 2022-07-09 LAB — RAD ONC ARIA SESSION SUMMARY
Course Elapsed Days: 0
Plan Fractions Treated to Date: 1
Plan Prescribed Dose Per Fraction: 12 Gy
Plan Total Fractions Prescribed: 5
Plan Total Prescribed Dose: 60 Gy
Reference Point Dosage Given to Date: 12 Gy
Reference Point Session Dosage Given: 12 Gy
Session Number: 1

## 2022-07-09 NOTE — Telephone Encounter (Signed)
[name] informed and understood.

## 2022-07-09 NOTE — Telephone Encounter (Signed)
Fax received mdINR PT/INR self testing service Test date/time 07/09/22 1252 pm INR 3.2

## 2022-07-09 NOTE — Telephone Encounter (Signed)
Description   Hold for 1 day and then continue dose to take half a tablet of the 3 mg tablets every Monday, Wednesday  and Friday and continue 3 mg tablets the rest of the week.  INR  3.2(goal is 2-3),  Recheck in 1-2 weeks, checks at home       Caryl Pina, MD [facility] 07/09/2022, 2:27 PM

## 2022-07-10 ENCOUNTER — Other Ambulatory Visit: Payer: Self-pay | Admitting: Family Medicine

## 2022-07-10 ENCOUNTER — Ambulatory Visit: Payer: Medicare HMO | Admitting: Radiation Oncology

## 2022-07-10 DIAGNOSIS — Z86711 Personal history of pulmonary embolism: Secondary | ICD-10-CM

## 2022-07-10 DIAGNOSIS — I10 Essential (primary) hypertension: Secondary | ICD-10-CM

## 2022-07-10 DIAGNOSIS — Z86718 Personal history of other venous thrombosis and embolism: Secondary | ICD-10-CM

## 2022-07-10 DIAGNOSIS — I5032 Chronic diastolic (congestive) heart failure: Secondary | ICD-10-CM

## 2022-07-11 ENCOUNTER — Ambulatory Visit: Payer: Medicare HMO

## 2022-07-11 ENCOUNTER — Other Ambulatory Visit: Payer: Self-pay

## 2022-07-11 ENCOUNTER — Ambulatory Visit
Admission: RE | Admit: 2022-07-11 | Discharge: 2022-07-11 | Disposition: A | Discharge status: Home / Self Care | Payer: Medicare HMO | Source: Ambulatory Visit | Attending: Radiation Oncology | Admitting: Radiation Oncology

## 2022-07-11 ENCOUNTER — Inpatient Hospital Stay: Payer: Medicare HMO

## 2022-07-11 DIAGNOSIS — C3411 Malignant neoplasm of upper lobe, right bronchus or lung: Secondary | ICD-10-CM | POA: Diagnosis not present

## 2022-07-11 DIAGNOSIS — C3412 Malignant neoplasm of upper lobe, left bronchus or lung: Secondary | ICD-10-CM

## 2022-07-11 LAB — RAD ONC ARIA SESSION SUMMARY
Course Elapsed Days: 2
Plan Fractions Treated to Date: 2
Plan Prescribed Dose Per Fraction: 12 Gy
Plan Total Fractions Prescribed: 5
Plan Total Prescribed Dose: 60 Gy
Reference Point Dosage Given to Date: 24 Gy
Reference Point Session Dosage Given: 12 Gy
Session Number: 2

## 2022-07-13 ENCOUNTER — Other Ambulatory Visit: Payer: Self-pay | Admitting: Family Medicine

## 2022-07-13 DIAGNOSIS — F01518 Vascular dementia, unspecified severity, with other behavioral disturbance: Secondary | ICD-10-CM

## 2022-07-13 DIAGNOSIS — N400 Enlarged prostate without lower urinary tract symptoms: Secondary | ICD-10-CM

## 2022-07-14 ENCOUNTER — Inpatient Hospital Stay: Payer: Medicare HMO

## 2022-07-14 ENCOUNTER — Ambulatory Visit: Admission: RE | Admit: 2022-07-14 | Payer: Medicare HMO | Source: Ambulatory Visit | Admitting: Radiation Oncology

## 2022-07-16 ENCOUNTER — Ambulatory Visit
Admission: RE | Admit: 2022-07-16 | Discharge: 2022-07-16 | Disposition: A | Discharge status: Home / Self Care | Payer: Medicare HMO | Source: Ambulatory Visit | Attending: Radiation Oncology | Admitting: Radiation Oncology

## 2022-07-16 ENCOUNTER — Other Ambulatory Visit: Payer: Self-pay

## 2022-07-16 ENCOUNTER — Telehealth: Payer: Self-pay | Admitting: *Deleted

## 2022-07-16 ENCOUNTER — Inpatient Hospital Stay: Payer: Medicare HMO

## 2022-07-16 DIAGNOSIS — Z86718 Personal history of other venous thrombosis and embolism: Secondary | ICD-10-CM

## 2022-07-16 DIAGNOSIS — C3412 Malignant neoplasm of upper lobe, left bronchus or lung: Secondary | ICD-10-CM | POA: Diagnosis not present

## 2022-07-16 DIAGNOSIS — Z86711 Personal history of pulmonary embolism: Secondary | ICD-10-CM

## 2022-07-16 DIAGNOSIS — C3411 Malignant neoplasm of upper lobe, right bronchus or lung: Secondary | ICD-10-CM | POA: Diagnosis not present

## 2022-07-16 LAB — RAD ONC ARIA SESSION SUMMARY
Course Elapsed Days: 7
Plan Fractions Treated to Date: 3
Plan Prescribed Dose Per Fraction: 12 Gy
Plan Total Fractions Prescribed: 5
Plan Total Prescribed Dose: 60 Gy
Reference Point Dosage Given to Date: 36 Gy
Reference Point Session Dosage Given: 12 Gy
Session Number: 3

## 2022-07-16 NOTE — Telephone Encounter (Signed)
Fax received mdINR PT/INR self testing service Test date/time 07/16/2022 @ 1202pm  INR 3.1 Normal  2-3  Call pt with changes

## 2022-07-16 NOTE — Telephone Encounter (Signed)
Description   continue dose to take half a tablet of the 3 mg tablets every Monday, Wednesday  and Friday and continue 3 mg tablets the rest of the week.  INR  3.1(goal is 2-3),  Recheck in 1-2 weeks, checks at home       Caryl Pina, MD [facility] 07/16/2022, 3:23 PM

## 2022-07-16 NOTE — Telephone Encounter (Signed)
Patient informed of recommendations

## 2022-07-17 ENCOUNTER — Other Ambulatory Visit: Payer: Self-pay | Admitting: Emergency Medicine

## 2022-07-17 DIAGNOSIS — E785 Hyperlipidemia, unspecified: Secondary | ICD-10-CM

## 2022-07-17 MED ORDER — ATORVASTATIN CALCIUM 40 MG PO TABS: 40.0000 mg | ORAL_TABLET | ORAL | 1 refills | Status: AC

## 2022-07-18 ENCOUNTER — Other Ambulatory Visit: Payer: Self-pay

## 2022-07-18 ENCOUNTER — Ambulatory Visit
Admission: RE | Admit: 2022-07-18 | Discharge: 2022-07-18 | Disposition: A | Discharge status: Home / Self Care | Payer: Medicare HMO | Source: Ambulatory Visit | Attending: Radiation Oncology | Admitting: Radiation Oncology

## 2022-07-18 ENCOUNTER — Ambulatory Visit: Payer: Medicare HMO

## 2022-07-18 ENCOUNTER — Inpatient Hospital Stay: Payer: Medicare HMO

## 2022-07-18 DIAGNOSIS — C3412 Malignant neoplasm of upper lobe, left bronchus or lung: Secondary | ICD-10-CM

## 2022-07-18 DIAGNOSIS — C3411 Malignant neoplasm of upper lobe, right bronchus or lung: Secondary | ICD-10-CM | POA: Diagnosis not present

## 2022-07-18 LAB — RAD ONC ARIA SESSION SUMMARY
Course Elapsed Days: 9
Plan Fractions Treated to Date: 4
Plan Prescribed Dose Per Fraction: 12 Gy
Plan Total Fractions Prescribed: 5
Plan Total Prescribed Dose: 60 Gy
Reference Point Dosage Given to Date: 48 Gy
Reference Point Session Dosage Given: 12 Gy
Session Number: 4

## 2022-07-21 ENCOUNTER — Ambulatory Visit: Payer: Medicare HMO | Admitting: Radiation Oncology

## 2022-07-22 ENCOUNTER — Encounter: Payer: Self-pay | Admitting: Urology

## 2022-07-22 ENCOUNTER — Inpatient Hospital Stay: Payer: Medicare HMO

## 2022-07-22 ENCOUNTER — Other Ambulatory Visit: Payer: Self-pay

## 2022-07-22 ENCOUNTER — Ambulatory Visit
Admission: RE | Admit: 2022-07-22 | Discharge: 2022-07-22 | Disposition: A | Discharge status: Home / Self Care | Payer: Medicare HMO | Source: Ambulatory Visit | Attending: Radiation Oncology | Admitting: Radiation Oncology

## 2022-07-22 DIAGNOSIS — C3412 Malignant neoplasm of upper lobe, left bronchus or lung: Secondary | ICD-10-CM | POA: Diagnosis not present

## 2022-07-22 DIAGNOSIS — C3411 Malignant neoplasm of upper lobe, right bronchus or lung: Secondary | ICD-10-CM

## 2022-07-22 DIAGNOSIS — Z510 Encounter for antineoplastic radiation therapy: Secondary | ICD-10-CM | POA: Diagnosis not present

## 2022-07-22 LAB — RAD ONC ARIA SESSION SUMMARY
Course Elapsed Days: 13
Plan Fractions Treated to Date: 5
Plan Prescribed Dose Per Fraction: 12 Gy
Plan Total Fractions Prescribed: 5
Plan Total Prescribed Dose: 60 Gy
Reference Point Dosage Given to Date: 60 Gy
Reference Point Session Dosage Given: 12 Gy
Session Number: 5

## 2022-07-22 NOTE — Progress Notes (Signed)
Remote ICD transmission.   

## 2022-07-23 ENCOUNTER — Ambulatory Visit: Payer: Self-pay | Admitting: Family Medicine

## 2022-07-23 ENCOUNTER — Telehealth: Payer: Self-pay | Admitting: *Deleted

## 2022-07-23 DIAGNOSIS — Z86718 Personal history of other venous thrombosis and embolism: Secondary | ICD-10-CM

## 2022-07-23 DIAGNOSIS — Z86711 Personal history of pulmonary embolism: Secondary | ICD-10-CM

## 2022-07-23 DIAGNOSIS — Z7901 Long term (current) use of anticoagulants: Secondary | ICD-10-CM | POA: Diagnosis not present

## 2022-07-23 LAB — POCT INR: INR: 3.5 — AB (ref 2.0–3.0)

## 2022-07-23 NOTE — Progress Notes (Signed)
Hold tonights dose and then continue dose to take half a tablet of the 3 mg tablets every Monday, Wednesday  and Friday and continue 3 mg tablets the rest of the week.  INR  3.5 not at goal (goal is 2-3)

## 2022-07-23 NOTE — Telephone Encounter (Signed)
Travis Cruz made aware and understood instructions. She is not sure why INR is elevated. States that they have not changed anything.

## 2022-07-23 NOTE — Telephone Encounter (Signed)
Fax received mdINR PT/INR self testing service Test date/time 07/23/22 640 am INR 3.5

## 2022-07-24 ENCOUNTER — Ambulatory Visit: Payer: Medicare HMO | Admitting: Radiation Oncology

## 2022-07-29 ENCOUNTER — Encounter: Payer: Self-pay | Admitting: Family Medicine

## 2022-07-29 ENCOUNTER — Telehealth: Payer: Self-pay | Admitting: *Deleted

## 2022-07-29 ENCOUNTER — Other Ambulatory Visit: Payer: Self-pay | Admitting: Nurse Practitioner

## 2022-07-29 DIAGNOSIS — Z86718 Personal history of other venous thrombosis and embolism: Secondary | ICD-10-CM

## 2022-07-29 DIAGNOSIS — L723 Sebaceous cyst: Secondary | ICD-10-CM

## 2022-07-29 DIAGNOSIS — Z86711 Personal history of pulmonary embolism: Secondary | ICD-10-CM

## 2022-07-29 MED ORDER — SULFAMETHOXAZOLE-TRIMETHOPRIM 800-160 MG PO TABS: 1.0000 | ORAL_TABLET | Freq: Two times a day (BID) | ORAL | 0 refills | Status: DC

## 2022-07-29 NOTE — Telephone Encounter (Signed)
Called family about inr

## 2022-07-29 NOTE — Addendum Note (Signed)
Addended by: Chevis Pretty on: 07/29/2022 01:51 PM   Modules accepted: Orders

## 2022-07-29 NOTE — Telephone Encounter (Signed)
Fax received mdINR PT/INR self testing service Test date/time 07/29/22 556 am INR 3.3

## 2022-07-29 NOTE — Telephone Encounter (Signed)
Daughter says he has sebaceosu cyst on his mid upper back. Has turned red and angry and has slight drainage.  Check INR on Thursday or Friday due to new antibiotic. Mary-Margaret Hassell Done, FNP

## 2022-08-04 DIAGNOSIS — J449 Chronic obstructive pulmonary disease, unspecified: Secondary | ICD-10-CM | POA: Diagnosis not present

## 2022-08-06 ENCOUNTER — Telehealth: Payer: Self-pay | Admitting: *Deleted

## 2022-08-06 DIAGNOSIS — Z86711 Personal history of pulmonary embolism: Secondary | ICD-10-CM

## 2022-08-06 DIAGNOSIS — Z86718 Personal history of other venous thrombosis and embolism: Secondary | ICD-10-CM

## 2022-08-06 NOTE — Telephone Encounter (Signed)
[name] has been informed and understood.

## 2022-08-06 NOTE — Telephone Encounter (Signed)
Description   Hold tonights dose and then lower weekly dose to take half a tablet of the 3 mg tablets every Monday,, Tuesday, Wednesday  and Friday and continue 3 mg tablets the rest of the week.  INR  3.3 not at goal (goal is 2-3) Recheck in 1-2 weeks, checks at home       Caryl Pina, MD [facility] 08/06/2022, 9:21 AM

## 2022-08-06 NOTE — Telephone Encounter (Signed)
Fax received mdINR PT/INR self testing service Test date/time 08/06/22 638 am INR 3.3

## 2022-08-13 ENCOUNTER — Telehealth: Payer: Self-pay | Admitting: *Deleted

## 2022-08-13 DIAGNOSIS — Z86711 Personal history of pulmonary embolism: Secondary | ICD-10-CM

## 2022-08-13 DIAGNOSIS — Z86718 Personal history of other venous thrombosis and embolism: Secondary | ICD-10-CM

## 2022-08-13 NOTE — Telephone Encounter (Signed)
Description   Continue weekly dose to take half a tablet of the 3 mg tablets every Monday,, Tuesday, Wednesday  and Friday and continue 3 mg tablets the rest of the week.  INR  2.6 not at goal (goal is 2-3) Recheck in 1-2 weeks, checks at home       Caryl Pina, MD [place] 08/13/2022, 2:57 PM

## 2022-08-13 NOTE — Telephone Encounter (Signed)
Fax received mdINR PT/INR self testing service Test date/time 08/13/22 638 am INR 2.6

## 2022-08-14 NOTE — Telephone Encounter (Signed)
Left message informing [name] or recommendations for pt. Advised to call back with any concerns.

## 2022-08-20 ENCOUNTER — Telehealth: Payer: Self-pay | Admitting: *Deleted

## 2022-08-20 DIAGNOSIS — Z86718 Personal history of other venous thrombosis and embolism: Secondary | ICD-10-CM

## 2022-08-20 DIAGNOSIS — Z86711 Personal history of pulmonary embolism: Secondary | ICD-10-CM

## 2022-08-20 DIAGNOSIS — Z7901 Long term (current) use of anticoagulants: Secondary | ICD-10-CM | POA: Diagnosis not present

## 2022-08-20 NOTE — Telephone Encounter (Signed)
Description   Continue weekly dose to take half a tablet of the 3 mg tablets every Monday,, Tuesday, Wednesday  and Friday and continue 3 mg tablets the rest of the week.  INR  2.6 not at goal (goal is 2-3) Recheck in 1-2 weeks, checks at home       Caryl Pina, MD [place] 08/20/2022, 2:19 PM

## 2022-08-20 NOTE — Telephone Encounter (Signed)
Aware and verbalizes understanding.  

## 2022-08-20 NOTE — Telephone Encounter (Signed)
Fax received mdINR PT/INR self testing service Test date/time 08/20/22 909 am INR 2.6

## 2022-08-27 ENCOUNTER — Telehealth: Payer: Self-pay | Admitting: *Deleted

## 2022-08-27 DIAGNOSIS — Z86711 Personal history of pulmonary embolism: Secondary | ICD-10-CM

## 2022-08-27 DIAGNOSIS — Z86718 Personal history of other venous thrombosis and embolism: Secondary | ICD-10-CM

## 2022-08-27 NOTE — Telephone Encounter (Signed)
Description   Continue weekly dose to take half a tablet of the 3 mg tablets every Monday,, Tuesday, Wednesday  and Friday and continue 3 mg tablets the rest of the week.  INR  2.2 not at goal (goal is 2-3) Recheck in 1-2 weeks, checks at home       Caryl Pina, MD [place] 08/27/2022, 1:43 PM

## 2022-08-27 NOTE — Telephone Encounter (Signed)
Fax received mdINR PT/INR self testing service Test date/time 08/27/22 1218 pm INR 2.2

## 2022-08-27 NOTE — Telephone Encounter (Signed)
[name] informed and understood. She has no concerns at this time.

## 2022-08-29 ENCOUNTER — Encounter: Payer: Self-pay | Admitting: Pulmonary Disease

## 2022-09-02 ENCOUNTER — Other Ambulatory Visit (HOSPITAL_COMMUNITY): Payer: Self-pay

## 2022-09-02 ENCOUNTER — Encounter: Payer: Self-pay | Admitting: Urology

## 2022-09-02 NOTE — Progress Notes (Signed)
Telephone appointment. I spoke w/ spouse Mrs. Marguerite Olea, verified her identity and began nursing interview. She reports patient doing well. No issues reported at this time.  Meaningful use complete.  Reminded spouse of patient's 9:30am-09/03/22 telephone appointment w/ Ashlyn Bruning PA-C. I left my extension [phone] in case patient needs anything. Spouse verbalized understanding.  Patient contact [phone]

## 2022-09-02 NOTE — Telephone Encounter (Signed)
Pharmacy,  Are there any other covered alternatives to Trelegy? Thanks.

## 2022-09-03 ENCOUNTER — Other Ambulatory Visit (HOSPITAL_COMMUNITY): Payer: Self-pay

## 2022-09-03 ENCOUNTER — Ambulatory Visit
Admission: RE | Admit: 2022-09-03 | Discharge: 2022-09-03 | Disposition: A | Discharge status: Home / Self Care | Payer: Medicare HMO | Source: Ambulatory Visit | Attending: Urology | Admitting: Urology

## 2022-09-03 DIAGNOSIS — C3412 Malignant neoplasm of upper lobe, left bronchus or lung: Secondary | ICD-10-CM

## 2022-09-03 NOTE — Addendum Note (Signed)
Encounter addended by: [name], PA-C on: 09/03/2022 3:09 PM  Actions taken: Clinical Note Signed

## 2022-09-03 NOTE — Progress Notes (Signed)
Radiation Oncology         [phone] ________________________________  Name: Travis Cruz MRN: [id number]  Date: 09/03/2022  DOB: 06-12-1946  Outpatient Follow up Visit  CC: Dettinger, Fransisca Kaufmann, MD  Grace Isaac, MD  Diagnosis:  Putative metachronous stage IA NSCLC of the left upper lobe lung in patient with a history of putative NSCLC of the right upper lobe, left upper lobe NSCLC, squamous cell carcinoma, and putative NSCLC of the right lower lung - Clinical stage IA     Interval Since Last Radiation: 6 weeks   07/09/22 - 07/22/22:   The target in the LUL lung was treated to 60 Gy in 5 fractions of 12 Gy each  10/10/19, 10/14/19, 10/17/19//SBRT:  The RUL lung target was treated to 54 Gy in 3 fractions of 18 Gy  03/23/2017 to 04/02/2017:  The RLL target was treated to 50 Gy in 5 fractions of 10 Gy    11/07/13-11/16/13:  Left upper lobe / 60 Gy in 5 fractions at 12 Gray per fraction  Narrative:  I spoke with the patient's wife, Travis Cruz, due to his significant, progressive dementia, to conduct his routine scheduled 1 month follow up via telephone to spare the patient unnecessary potential exposure in the healthcare setting during the current COVID-19 pandemic.  The patient was notified in advance and gave permission to proceed with this visit format.   In summary, he has a history of  NSCLC, squamous cell carcinoma in the left upper lobe treated with a 5 fraction course of SBRT in 2014.  He tolerated this treatment well and remained disease-free on follow-up CT chest scans until 2018 when he was noted to have a new lesion in the right lower lobe lung, presumed to be a new, stage I NSCLC.due to his significant underlying COPD, he was not felt to be a good candidate for biopsy so the decision was made to treat this lesion with a 5 fraction course of SBRT, completed in 03/2017.  He again tolerated this treatment well and remained disease-free until September 2020 when he was noted to have  a new, 1.8 cm solid nodule in the posterior right upper lobe lung, suspicious for metastasis or metachronous primary bronchogenic carcinoma.  There was no thoracic adenopathy or other potential findings of metastatic disease in the chest.  PET scan was completed on 09/26/19 and demonstrated hypermetabolic activity in the posterior right upper lobe 1.8 cm pulmonary nodule compatible with pulmonary malignancy. Fortunately, there was no hypermetabolic thoracic adenopathy or distant metastatic disease and no hypermetabolic recurrence at the sites of previous radiation therapy in the perihilar left upper and right lower lung lobes.  Again, he was not felt to be high risk for surgery or biopsy given his underlying severe COPD, so the decision was to proceed with a 3 fraction course of SBRT to the RUL lung nodule, completed in October 2020.  Since that time, he has continued in routine surveillance with serial CT chest scans every 3-6 months.  He was hospitalized in 02/2020 and again in 11/2020 for acute hypoxic respiratory failure secondary to acute exacerbation of COPD and Covid in 11/2020. He was treated with multiple rounds of antibiotics and steroids which his wife feels has made an improvement in his breathing. He is followed with [facility], c/o Dr. Silas Flood and previously with Dr. Noemi Chapel.  His most recent visit with Dr. Silas Flood was 05/13/22 and his next follow up will be in 6 months.  Since his  visit in 10/2021, he has remained clinically stable.   His most recent CT Chest from 05/22/2022 showed a 2.5 cm lobular nodule in the left upper lobe lung, concerning for recurrent disease.  There is an additional 1.2 cm subpleural nodule in the left upper lobe lung that could be postinfectious/postinflammatory.  Otherwise, the bilateral radiation fibrosis appeared grossly stable.  This was discussed with his wife, Travis Cruz, on 05/28/22 and the decision was made to proceed with PET imaging for further evaluation.  The PET was completed on 06/18/22 and confirms intense hypermetabolism in the LUL lung nodule, consistent with lung cancer recurrence. There were no hypermetabolic mediastinal nodal metastasis or distant metastatic disease. We reviewed the results and the recommendation for a 3 fraction course of SBRT to the left upper lobe lung lesion which they were in agreement with.  His treatments were completed on 07/22/2022 and he tolerated these very well with only mild to moderate fatigue.  On review of systems, obtained from his wife, Travis Cruz, due to patient's dementia, the patient states that he is doing fair and is currently without any new respiratory complaints.  She reports that his cognition and performance status have continued to decline gradually related to his progressive dementia.  He specifically denies dysphagia, chest pain, productive cough, hemoptysis, fever, chills, N/V.  He reports a decent appetite and is working on maintaining his weight.  His energy level is low but overall stable.  He continues on 3-4 L of oxygen via nasal cannula 24/7.  He denies fever, chills or night sweats.  ALLERGIES:  is allergic to ativan [lorazepam], cefuroxime axetil, and zinacef [cefuroxime].  Meds: Current Outpatient Medications  Medication Sig Dispense Refill   acetaminophen (TYLENOL) 325 MG tablet Take 2 tablets (650 mg total) by mouth every 6 (six) hours as needed for mild pain, fever or headache (or Fever >/= 101). 12 tablet 9   albuterol (VENTOLIN HFA) 108 (90 Base) MCG/ACT inhaler Inhale 2 puffs into the lungs every 4 (four) hours as needed for wheezing or shortness of breath. 18 g 11   ascorbic acid (VITAMIN C) 500 MG tablet Take 1 tablet (500 mg total) by mouth daily. 30 tablet 0   atorvastatin (LIPITOR) 40 MG tablet Take 1 tablet (40 mg total) by mouth 2 (two) times a week. 45 tablet 1   azithromycin (ZITHROMAX) 250 MG tablet Take 1 tablet (250 mg total) by mouth 3 (three) times a week. Monday Wednesday  Friday. 30 tablet 4   Cholecalciferol (VITAMIN D3) 2000 units TABS Take 2,000 Units by mouth daily.      finasteride (PROSCAR) 5 MG tablet Take 1 tablet (5 mg total) by mouth daily. 90 tablet 3   Fluticasone-Umeclidin-Vilant (TRELEGY ELLIPTA) 100-62.5-25 MCG/INH AEPB Inhale 1 puff into the lungs daily. 60 each 11   furosemide (LASIX) 20 MG tablet TAKE 2 TABLETS BY MOUTH EVERY DAY 180 tablet 1   metoprolol tartrate (LOPRESSOR) 25 MG tablet TAKE 1.5 TABLETS (37.5 MG TOTAL) BY MOUTH TWICE A DAY 270 tablet 0   MODERNA COVID-19 BIVAL BOOSTER 50 MCG/0.5ML injection      Multiple Vitamin (MULTIVITAMIN WITH MINERALS) TABS tablet Take 1 tablet by mouth daily.     Multiple Vitamins-Minerals (PRESERVISION AREDS PO) Take 1 capsule by mouth 2 (two) times daily.     OXYGEN Inhale 3 L/L into the lungs.     sertraline (ZOLOFT) 100 MG tablet TAKE 1 TABLET BY MOUTH EVERY DAY 90 tablet 1   sulfamethoxazole-trimethoprim (BACTRIM DS) 800-160 MG  tablet Take 1 tablet by mouth 2 (two) times daily. 20 tablet 0   tamsulosin (FLOMAX) 0.4 MG CAPS capsule TAKE 1 CAPSULE BY MOUTH EVERY DAY AFTER SUPPER 90 capsule 1   warfarin (COUMADIN) 3 MG tablet TAKE 1/2 TAB ON MON & FRI, AND 1 TAB (3 MG) ON ALL OTHER DAYS 78 tablet 2   No current facility-administered medications for this encounter.    Physical Findings:  vitals were not taken for this visit.  Pain Assessment Pain Score: 0-No pain/Unable to assess due to telephone follow-up visit format.  Lab Findings: Lab Results  Component Value Date   WBC 14.9 (H) 05/01/2022   HGB 16.1 05/01/2022   HCT 49.3 05/01/2022   MCV 88 05/01/2022   PLT 325 05/01/2022     Radiographic Findings: No results found.  Impression/Plan: 1. 76 y/o male with putative stage IA NSCLC in the left upper lobe lung with a history of left upper lobe NSCLC, squamous cell carcinoma, and putative NSCLC of the right upper and lower lung - Clinical stage IA.  He has recovered well from the  effects of his recent radiotherapy and remains without complaints aside from his chronic shortness of breath with exertion related to his COPD.  We discussed the plan to obtain a posttreatment CT chest scan at [facility] in approximately 2 months, to assess his treatment response and pending this scan is stable we will then resume serial CT chest scans every 6 months to continue to monitor for any evidence of disease progression or recurrence.  I will plan to call them following each scan, to discuss results and any recommendations.  They are comfortable with and in agreement with this plan and know to call at any time with any questions or concerns in the interim.  I will forward a copy of my note today to Dr. Warrick Parisian, Dr. Silas Flood and Dr. Caryl Comes to keep them informed as well.    Given current concerns for patient exposure during the COVID-19 pandemic, this encounter was conducted via telephone. The patient/wife has given verbal consent for this type of encounter. The attendants for this meeting include Travis Durall PA-C, patient, Travis Cruz and his wife, Travis Cruz. During the encounter, Travis Orrick PA-C, was located at [facility] Radiation Oncology Department.  Patient, Travis Cruz and his wife, Travis Cruz, were located at home.  I personally spent 20 minutes in this encounter including chart review, reviewing radiological studies, in telephone conversation with the patient/wife, entering orders, coordinating care and completing documentation.   Travis Johns, PA-C    Tyler Pita, MD  [facility] Direct Dial: [phone]  Fax: [phone] [email or web address]  Skype  [email or web address]

## 2022-09-03 NOTE — Progress Notes (Addendum)
  Radiation Oncology         [phone] ________________________________  Name: Travis Cruz MRN: [id number]  Date: 07/22/2022  DOB: [birth year]  End of Treatment Note  Diagnosis:   Putative metachronous stage IA NSCLC of the left upper lobe lung in patient with a history of putative NSCLC of the right upper lobe, left upper lobe NSCLC, squamous cell carcinoma, and putative NSCLC of the right lower lung - Clinical stage IA        Indication for treatment:  Curative, Definitive SBRT       Radiation treatment dates:   07/09/22 - 07/22/22  Site/dose:   The target in the LUL lung was treated to 60 Gy in 5 fractions of 12 Gy each  Beams/energy:   The patient was treated using stereotactic body radiotherapy according to a 3D conformal radiotherapy plan.  Volumetric arc fields were employed to deliver 6 MV X-rays.  Image guidance was performed with per fraction cone beam CT prior to treatment under personal MD supervision.  Immobilization was achieved using BodyFix Pillow.  Narrative: The patient tolerated radiation treatment relatively well without any ill side effects.  Plan: The patient has completed radiation treatment. The patient will return to radiation oncology clinic for routine followup in one month. I advised them to call or return sooner if they have any questions or concerns related to their recovery or treatment. ________________________________  Sheral Apley. Tammi Klippel, M.D.

## 2022-09-04 ENCOUNTER — Telehealth: Payer: Self-pay

## 2022-09-04 DIAGNOSIS — Z86718 Personal history of other venous thrombosis and embolism: Secondary | ICD-10-CM

## 2022-09-04 DIAGNOSIS — J449 Chronic obstructive pulmonary disease, unspecified: Secondary | ICD-10-CM | POA: Diagnosis not present

## 2022-09-04 DIAGNOSIS — Z86711 Personal history of pulmonary embolism: Secondary | ICD-10-CM

## 2022-09-04 MED ORDER — TRELEGY ELLIPTA 100-62.5-25 MCG/ACT IN AEPB: 1.0000 | INHALATION_SPRAY | Freq: Every day | RESPIRATORY_TRACT | 5 refills | Status: AC

## 2022-09-04 NOTE — Telephone Encounter (Signed)
Description   Continue weekly dose to take half a tablet of the 3 mg tablets every Monday,, Tuesday, Wednesday  and Friday and continue 3 mg tablets the rest of the week.  INR  2.1 not at goal (goal is 2-3) Recheck in 1-2 weeks, checks at home       Caryl Pina, MD [facility] 09/04/2022, 9:10 AM

## 2022-09-04 NOTE — Telephone Encounter (Signed)
Aware. 

## 2022-09-04 NOTE — Telephone Encounter (Signed)
Fax received mdINR PT/INR self testing service Test date/time 09/03/22 6:26pm INR 2.1

## 2022-09-10 ENCOUNTER — Telehealth: Payer: Self-pay | Admitting: *Deleted

## 2022-09-10 DIAGNOSIS — Z86711 Personal history of pulmonary embolism: Secondary | ICD-10-CM

## 2022-09-10 DIAGNOSIS — Z86718 Personal history of other venous thrombosis and embolism: Secondary | ICD-10-CM

## 2022-09-10 NOTE — Telephone Encounter (Signed)
Fax received mdINR PT/INR self testing service Test date/time 09/10/22 545 am INR 2.1

## 2022-09-11 NOTE — Telephone Encounter (Signed)
Description   Continue weekly dose to take half a tablet of the 3 mg tablets every Monday,, Tuesday, Wednesday  and Friday and continue 3 mg tablets the rest of the week.  INR  2.1 at goal (goal is 2-3) Recheck in 1-2 weeks, checks at home       Caryl Pina, MD [place] 09/11/2022, 7:50 AM

## 2022-09-11 NOTE — Telephone Encounter (Signed)
[name] informed and understood. No concerns today.

## 2022-09-12 DIAGNOSIS — G4733 Obstructive sleep apnea (adult) (pediatric): Secondary | ICD-10-CM | POA: Diagnosis not present

## 2022-09-17 ENCOUNTER — Telehealth: Payer: Self-pay | Admitting: *Deleted

## 2022-09-17 DIAGNOSIS — Z86718 Personal history of other venous thrombosis and embolism: Secondary | ICD-10-CM

## 2022-09-17 DIAGNOSIS — Z7901 Long term (current) use of anticoagulants: Secondary | ICD-10-CM | POA: Diagnosis not present

## 2022-09-17 DIAGNOSIS — Z86711 Personal history of pulmonary embolism: Secondary | ICD-10-CM

## 2022-09-17 NOTE — Telephone Encounter (Signed)
Fax received mdINR PT/INR self testing service Test date/time 09/17/22 546 am INR 2.6

## 2022-09-17 NOTE — Telephone Encounter (Signed)
Description   Continue weekly dose to take half a tablet of the 3 mg tablets every Monday,, Tuesday, Wednesday  and Friday and continue 3 mg tablets the rest of the week.  INR  2.6 at goal (goal is 2-3) Recheck in 1-2 weeks, checks at home       Caryl Pina, MD [facility] 09/17/2022, 3:25 PM

## 2022-09-17 NOTE — Telephone Encounter (Signed)
Wife aware and verbalizes understanding per dpr.  °

## 2022-09-24 ENCOUNTER — Telehealth (INDEPENDENT_AMBULATORY_CARE_PROVIDER_SITE_OTHER): Payer: Medicare HMO | Admitting: *Deleted

## 2022-09-24 DIAGNOSIS — Z86718 Personal history of other venous thrombosis and embolism: Secondary | ICD-10-CM

## 2022-09-24 DIAGNOSIS — Z86711 Personal history of pulmonary embolism: Secondary | ICD-10-CM

## 2022-09-24 NOTE — Telephone Encounter (Signed)
Fax received mdINR PT/INR self testing service Test date/time 09/24/22 535 am INR 2.3

## 2022-09-24 NOTE — Telephone Encounter (Signed)
Description   Continue weekly dose to take half a tablet of the 3 mg tablets every Monday,, Tuesday, Wednesday  and Friday and continue 3 mg tablets the rest of the week.  INR  2.3 at goal (goal is 2-3) Recheck in 1-2 weeks, checks at home       Caryl Pina, MD [facility] 09/24/2022, 9:51 AM

## 2022-09-24 NOTE — Addendum Note (Signed)
Addended by: Caryl Pina on: 09/24/2022 09:51 AM   Modules accepted: Level of Service

## 2022-09-24 NOTE — Telephone Encounter (Signed)
Wife aware and verbalizes understanding.  

## 2022-09-29 ENCOUNTER — Encounter: Payer: Self-pay | Admitting: Family Medicine

## 2022-10-01 ENCOUNTER — Telehealth: Payer: Self-pay | Admitting: *Deleted

## 2022-10-01 DIAGNOSIS — Z86718 Personal history of other venous thrombosis and embolism: Secondary | ICD-10-CM

## 2022-10-01 DIAGNOSIS — Z86711 Personal history of pulmonary embolism: Secondary | ICD-10-CM

## 2022-10-01 NOTE — Telephone Encounter (Signed)
Description   Continue weekly dose to take half a tablet of the 3 mg tablets every Monday,, Tuesday, Wednesday  and Friday and continue 3 mg tablets the rest of the week.  INR  2.4 at goal (goal is 2-3) Recheck in 1-2 weeks, checks at home       Caryl Pina, MD [place] 10/01/2022, 8:01 AM

## 2022-10-01 NOTE — Telephone Encounter (Signed)
Fax received mdINR PT/INR self testing service Test date/time 10/01/22 527 am INR 2.4

## 2022-10-01 NOTE — Telephone Encounter (Signed)
Wife aware and verbalizes understanding per dpr.  °

## 2022-10-02 ENCOUNTER — Ambulatory Visit (INDEPENDENT_AMBULATORY_CARE_PROVIDER_SITE_OTHER): Payer: Medicare HMO

## 2022-10-02 DIAGNOSIS — I255 Ischemic cardiomyopathy: Secondary | ICD-10-CM | POA: Diagnosis not present

## 2022-10-02 LAB — CUP PACEART REMOTE DEVICE CHECK
Battery Remaining Longevity: 11 mo
Battery Remaining Percentage: 10 %
Battery Voltage: 2.68 V
Brady Statistic RV Percent Paced: 1 %
Date Time Interrogation Session: 20231005020016
HighPow Impedance: 93 Ohm
HighPow Impedance: 93 Ohm
Implantable Lead Implant Date: 20140205
Implantable Lead Location: 753860
Implantable Lead Model: 181
Implantable Lead Serial Number: 323859
Implantable Pulse Generator Implant Date: 20140205
Lead Channel Impedance Value: 450 Ohm
Lead Channel Pacing Threshold Amplitude: 1 V
Lead Channel Pacing Threshold Pulse Width: 0.5 ms
Lead Channel Sensing Intrinsic Amplitude: 10.8 mV
Lead Channel Setting Pacing Amplitude: 2.5 V
Lead Channel Setting Pacing Pulse Width: 0.5 ms
Lead Channel Setting Sensing Sensitivity: 0.5 mV
Pulse Gen Serial Number: 7040910

## 2022-10-04 DIAGNOSIS — J449 Chronic obstructive pulmonary disease, unspecified: Secondary | ICD-10-CM | POA: Diagnosis not present

## 2022-10-08 ENCOUNTER — Telehealth: Payer: Self-pay | Admitting: *Deleted

## 2022-10-08 ENCOUNTER — Other Ambulatory Visit: Payer: Self-pay | Admitting: Family Medicine

## 2022-10-08 DIAGNOSIS — Z86718 Personal history of other venous thrombosis and embolism: Secondary | ICD-10-CM

## 2022-10-08 DIAGNOSIS — I10 Essential (primary) hypertension: Secondary | ICD-10-CM

## 2022-10-08 DIAGNOSIS — Z86711 Personal history of pulmonary embolism: Secondary | ICD-10-CM

## 2022-10-08 DIAGNOSIS — I5032 Chronic diastolic (congestive) heart failure: Secondary | ICD-10-CM

## 2022-10-08 NOTE — Telephone Encounter (Signed)
Description   Continue weekly dose to take half a tablet of the 3 mg tablets every Monday,, Tuesday, Wednesday  and Friday and continue 3 mg tablets the rest of the week.  INR  2.6 at goal (goal is 2-3) Recheck in 1-2 weeks, checks at home       Caryl Pina, MD [place] 10/08/2022, 8:49 AM

## 2022-10-08 NOTE — Telephone Encounter (Signed)
[name] made aware and has no concerns.

## 2022-10-08 NOTE — Telephone Encounter (Signed)
Fax received mdINR PT/INR self testing service Test date/time 10/08/22 657 am INR 2.6

## 2022-10-09 NOTE — Progress Notes (Signed)
Remote ICD transmission.   

## 2022-10-15 ENCOUNTER — Telehealth: Payer: Self-pay | Admitting: *Deleted

## 2022-10-15 DIAGNOSIS — Z86718 Personal history of other venous thrombosis and embolism: Secondary | ICD-10-CM | POA: Diagnosis not present

## 2022-10-15 DIAGNOSIS — Z7901 Long term (current) use of anticoagulants: Secondary | ICD-10-CM | POA: Diagnosis not present

## 2022-10-15 DIAGNOSIS — Z86711 Personal history of pulmonary embolism: Secondary | ICD-10-CM

## 2022-10-15 NOTE — Telephone Encounter (Signed)
Fax received mdINR PT/INR self testing service Test date/time 10/15/22 609 am INR 2.2

## 2022-10-15 NOTE — Telephone Encounter (Signed)
Description   Continue weekly dose to take half a tablet of the 3 mg tablets every Monday,, Tuesday, Wednesday  and Friday and continue 3 mg tablets the rest of the week.  INR  2.2 at goal (goal is 2-3) Recheck in 1-2 weeks, checks at home       Caryl Pina, MD [place] 10/15/2022, 8:04 AM

## 2022-10-15 NOTE — Telephone Encounter (Signed)
[name] made aware and understood.

## 2022-10-16 ENCOUNTER — Encounter: Payer: Self-pay | Admitting: Family Medicine

## 2022-10-16 ENCOUNTER — Telehealth: Payer: Medicare HMO | Admitting: Family Medicine

## 2022-10-16 ENCOUNTER — Telehealth (INDEPENDENT_AMBULATORY_CARE_PROVIDER_SITE_OTHER): Payer: Medicare HMO | Admitting: Family Medicine

## 2022-10-16 DIAGNOSIS — Z9181 History of falling: Secondary | ICD-10-CM | POA: Diagnosis not present

## 2022-10-16 DIAGNOSIS — F03918 Unspecified dementia, unspecified severity, with other behavioral disturbance: Secondary | ICD-10-CM

## 2022-10-16 DIAGNOSIS — R69 Illness, unspecified: Secondary | ICD-10-CM | POA: Diagnosis not present

## 2022-10-16 NOTE — Progress Notes (Signed)
Virtual Visit via mychart video Note  I connected with Travis Cruz on 10/16/22 at 1500 by video  and verified that I am speaking with the correct person using two identifiers. Travis Cruz is currently located at home and patient and wife  are currently with her during visit. The provider, Fransisca Kaufmann Foye Damron, MD is located in their office at time of visit.  Call ended at 1515  I discussed the limitations, risks, security and privacy concerns of performing an evaluation and management service by video and the availability of in person appointments. I also discussed with the patient that there may be a patient responsible charge related to this service. The patient expressed understanding and agreed to proceed.   History and Present Illness: Patient is having a lot of with balance and transfer and has nearly fallen 3 times. His memory and cognitive issues have worsened.  He sits in recliner but when he gets out of that he has trouble. He locks his knees and he about falls.     Outpatient Encounter Medications as of 10/16/2022  Medication Sig   acetaminophen (TYLENOL) 325 MG tablet Take 2 tablets (650 mg total) by mouth every 6 (six) hours as needed for mild pain, fever or headache (or Fever >/= 101).   albuterol (VENTOLIN HFA) 108 (90 Base) MCG/ACT inhaler Inhale 2 puffs into the lungs every 4 (four) hours as needed for wheezing or shortness of breath.   ascorbic acid (VITAMIN C) 500 MG tablet Take 1 tablet (500 mg total) by mouth daily.   atorvastatin (LIPITOR) 40 MG tablet Take 1 tablet (40 mg total) by mouth 2 (two) times a week.   azithromycin (ZITHROMAX) 250 MG tablet Take 1 tablet (250 mg total) by mouth 3 (three) times a week. Monday Wednesday Friday.   Cholecalciferol (VITAMIN D3) 2000 units TABS Take 2,000 Units by mouth daily.    finasteride (PROSCAR) 5 MG tablet Take 1 tablet (5 mg total) by mouth daily.   Fluticasone-Umeclidin-Vilant (TRELEGY ELLIPTA) 100-62.5-25  MCG/ACT AEPB Inhale 1 puff into the lungs daily.   Fluticasone-Umeclidin-Vilant (TRELEGY ELLIPTA) 100-62.5-25 MCG/INH AEPB Inhale 1 puff into the lungs daily.   furosemide (LASIX) 20 MG tablet TAKE 2 TABLETS BY MOUTH EVERY DAY   metoprolol tartrate (LOPRESSOR) 25 MG tablet TAKE 1.5 TABLETS (37.5 MG TOTAL) BY MOUTH TWICE A DAY   MODERNA COVID-19 BIVAL BOOSTER 50 MCG/0.5ML injection    Multiple Vitamin (MULTIVITAMIN WITH MINERALS) TABS tablet Take 1 tablet by mouth daily.   Multiple Vitamins-Minerals (PRESERVISION AREDS PO) Take 1 capsule by mouth 2 (two) times daily.   OXYGEN Inhale 3 L/L into the lungs.   sertraline (ZOLOFT) 100 MG tablet TAKE 1 TABLET BY MOUTH EVERY DAY   sulfamethoxazole-trimethoprim (BACTRIM DS) 800-160 MG tablet Take 1 tablet by mouth 2 (two) times daily.   tamsulosin (FLOMAX) 0.4 MG CAPS capsule TAKE 1 CAPSULE BY MOUTH EVERY DAY AFTER SUPPER   warfarin (COUMADIN) 3 MG tablet TAKE 1/2 TAB ON MON & FRI, AND 1 TAB (3 MG) ON ALL OTHER DAYS   No facility-administered encounter medications on file as of 10/16/2022.    Review of Systems  Constitutional:  Negative for chills and fever.  Eyes:  Negative for pain.  Respiratory:  Negative for cough, shortness of breath and wheezing.   Cardiovascular:  Negative for chest pain, palpitations and leg swelling.  Genitourinary:  Negative for dysuria and hematuria.  Musculoskeletal:  Negative for back pain, joint pain and myalgias.  Skin:  Negative for rash.  Neurological:  Negative for dizziness, sensory change, focal weakness, weakness and headaches.  Psychiatric/Behavioral:  Negative for depression and suicidal ideas.      Observations/Objective: Patient sounds comfortable and in no acute distress  Assessment and Plan: Problem List Items Addressed This Visit       Nervous and Auditory   Dementia with behavioral disturbance - Primary   Relevant Orders   For home use only DME Other see comment   For home use only DME  Hospital bed   Ambulatory referral to [facility]   Other Visit Diagnoses     At high risk for falls       Relevant Orders   For home use only DME Other see comment   For home use only DME Hospital bed   Ambulatory referral to [facility]       Will try for home health and PT, wife can no longer lift him to transfer from bed to chair.   Follow up plan: Return if symptoms worsen or fail to improve.     I discussed the assessment and treatment plan with the patient. The patient was provided an opportunity to ask questions and all were answered. The patient agreed with the plan and demonstrated an understanding of the instructions.   The patient was advised to call back or seek an in-person evaluation if the symptoms worsen or if the condition fails to improve as anticipated.  The above assessment and management plan was discussed with the patient. The patient verbalized understanding of and has agreed to the management plan. Patient is aware to call the clinic if symptoms persist or worsen. Patient is aware when to return to the clinic for a follow-up visit. Patient educated on when it is appropriate to go to the emergency department.    I provided 15 minutes of non-face-to-face time during this encounter.    Worthy Rancher, MD

## 2022-10-17 ENCOUNTER — Telehealth: Payer: Self-pay

## 2022-10-17 NOTE — Telephone Encounter (Signed)
[facility] orders for   Hospital bed, hoyer lift, trapeze bar and gel overlay.  Included last OV note and insurance cards.  Fax: [phone]

## 2022-10-22 ENCOUNTER — Telehealth: Payer: Self-pay | Admitting: *Deleted

## 2022-10-22 DIAGNOSIS — Z86711 Personal history of pulmonary embolism: Secondary | ICD-10-CM

## 2022-10-22 DIAGNOSIS — Z86718 Personal history of other venous thrombosis and embolism: Secondary | ICD-10-CM

## 2022-10-22 NOTE — Telephone Encounter (Signed)
Fax received mdINR PT/INR self testing service Test date/time 10/22/22 542 am INR 2.0

## 2022-10-22 NOTE — Telephone Encounter (Signed)
Description   Continue weekly dose to take half a tablet of the 3 mg tablets every Monday,, Tuesday, Wednesday  and Friday and continue 3 mg tablets the rest of the week.  INR  2.0 at goal (goal is 2-3) Recheck in 1-2 weeks, checks at home       Caryl Pina, MD [place] 10/22/2022, 8:02 AM

## 2022-10-22 NOTE — Telephone Encounter (Signed)
Spoke with wife , aware of results

## 2022-10-22 NOTE — Telephone Encounter (Signed)
Rc for results

## 2022-10-29 ENCOUNTER — Telehealth: Payer: Self-pay | Admitting: *Deleted

## 2022-10-29 DIAGNOSIS — C3411 Malignant neoplasm of upper lobe, right bronchus or lung: Secondary | ICD-10-CM | POA: Diagnosis not present

## 2022-10-29 DIAGNOSIS — Z9181 History of falling: Secondary | ICD-10-CM | POA: Diagnosis not present

## 2022-10-29 DIAGNOSIS — I252 Old myocardial infarction: Secondary | ICD-10-CM | POA: Diagnosis not present

## 2022-10-29 DIAGNOSIS — Z86711 Personal history of pulmonary embolism: Secondary | ICD-10-CM | POA: Diagnosis not present

## 2022-10-29 DIAGNOSIS — C3431 Malignant neoplasm of lower lobe, right bronchus or lung: Secondary | ICD-10-CM | POA: Diagnosis not present

## 2022-10-29 DIAGNOSIS — I110 Hypertensive heart disease with heart failure: Secondary | ICD-10-CM | POA: Diagnosis not present

## 2022-10-29 DIAGNOSIS — I2510 Atherosclerotic heart disease of native coronary artery without angina pectoris: Secondary | ICD-10-CM | POA: Diagnosis not present

## 2022-10-29 DIAGNOSIS — Z86718 Personal history of other venous thrombosis and embolism: Secondary | ICD-10-CM | POA: Diagnosis not present

## 2022-10-29 DIAGNOSIS — J9611 Chronic respiratory failure with hypoxia: Secondary | ICD-10-CM | POA: Diagnosis not present

## 2022-10-29 DIAGNOSIS — R69 Illness, unspecified: Secondary | ICD-10-CM | POA: Diagnosis not present

## 2022-10-29 DIAGNOSIS — D692 Other nonthrombocytopenic purpura: Secondary | ICD-10-CM | POA: Diagnosis not present

## 2022-10-29 DIAGNOSIS — J449 Chronic obstructive pulmonary disease, unspecified: Secondary | ICD-10-CM | POA: Diagnosis not present

## 2022-10-29 DIAGNOSIS — C3412 Malignant neoplasm of upper lobe, left bronchus or lung: Secondary | ICD-10-CM | POA: Diagnosis not present

## 2022-10-29 DIAGNOSIS — I5042 Chronic combined systolic (congestive) and diastolic (congestive) heart failure: Secondary | ICD-10-CM | POA: Diagnosis not present

## 2022-10-29 DIAGNOSIS — Z9981 Dependence on supplemental oxygen: Secondary | ICD-10-CM | POA: Diagnosis not present

## 2022-10-29 DIAGNOSIS — I255 Ischemic cardiomyopathy: Secondary | ICD-10-CM | POA: Diagnosis not present

## 2022-10-29 DIAGNOSIS — H3530 Unspecified macular degeneration: Secondary | ICD-10-CM | POA: Diagnosis not present

## 2022-10-29 DIAGNOSIS — Z556 Problems related to health literacy: Secondary | ICD-10-CM | POA: Diagnosis not present

## 2022-10-29 DIAGNOSIS — Z7901 Long term (current) use of anticoagulants: Secondary | ICD-10-CM | POA: Diagnosis not present

## 2022-10-29 DIAGNOSIS — E785 Hyperlipidemia, unspecified: Secondary | ICD-10-CM | POA: Diagnosis not present

## 2022-10-29 NOTE — Telephone Encounter (Signed)
Description   Continue weekly dose to take half a tablet of the 3 mg tablets every Monday,, Tuesday, Wednesday  and Friday and continue 3 mg tablets the rest of the week.  INR  2.3 at goal (goal is 2-3) Recheck in 1-2 weeks, checks at home       Caryl Pina, MD [place] 10/29/2022, 8:09 AM

## 2022-10-29 NOTE — Telephone Encounter (Signed)
Fax received mdINR PT/INR self testing service Test date/time 10/29/22 546 am INR 2.3

## 2022-10-29 NOTE — Telephone Encounter (Signed)
Patient's wife aware of instructions.

## 2022-10-31 DIAGNOSIS — D692 Other nonthrombocytopenic purpura: Secondary | ICD-10-CM | POA: Diagnosis not present

## 2022-10-31 DIAGNOSIS — I255 Ischemic cardiomyopathy: Secondary | ICD-10-CM | POA: Diagnosis not present

## 2022-10-31 DIAGNOSIS — E785 Hyperlipidemia, unspecified: Secondary | ICD-10-CM | POA: Diagnosis not present

## 2022-10-31 DIAGNOSIS — C3411 Malignant neoplasm of upper lobe, right bronchus or lung: Secondary | ICD-10-CM | POA: Diagnosis not present

## 2022-10-31 DIAGNOSIS — J9611 Chronic respiratory failure with hypoxia: Secondary | ICD-10-CM | POA: Diagnosis not present

## 2022-10-31 DIAGNOSIS — I110 Hypertensive heart disease with heart failure: Secondary | ICD-10-CM | POA: Diagnosis not present

## 2022-10-31 DIAGNOSIS — R69 Illness, unspecified: Secondary | ICD-10-CM | POA: Diagnosis not present

## 2022-10-31 DIAGNOSIS — C3412 Malignant neoplasm of upper lobe, left bronchus or lung: Secondary | ICD-10-CM | POA: Diagnosis not present

## 2022-10-31 DIAGNOSIS — Z556 Problems related to health literacy: Secondary | ICD-10-CM | POA: Diagnosis not present

## 2022-10-31 DIAGNOSIS — C3431 Malignant neoplasm of lower lobe, right bronchus or lung: Secondary | ICD-10-CM | POA: Diagnosis not present

## 2022-10-31 DIAGNOSIS — I252 Old myocardial infarction: Secondary | ICD-10-CM | POA: Diagnosis not present

## 2022-10-31 DIAGNOSIS — Z9981 Dependence on supplemental oxygen: Secondary | ICD-10-CM | POA: Diagnosis not present

## 2022-10-31 DIAGNOSIS — Z7901 Long term (current) use of anticoagulants: Secondary | ICD-10-CM | POA: Diagnosis not present

## 2022-10-31 DIAGNOSIS — I5042 Chronic combined systolic (congestive) and diastolic (congestive) heart failure: Secondary | ICD-10-CM | POA: Diagnosis not present

## 2022-10-31 DIAGNOSIS — I2510 Atherosclerotic heart disease of native coronary artery without angina pectoris: Secondary | ICD-10-CM | POA: Diagnosis not present

## 2022-10-31 DIAGNOSIS — H3530 Unspecified macular degeneration: Secondary | ICD-10-CM | POA: Diagnosis not present

## 2022-10-31 DIAGNOSIS — Z86718 Personal history of other venous thrombosis and embolism: Secondary | ICD-10-CM | POA: Diagnosis not present

## 2022-10-31 DIAGNOSIS — J449 Chronic obstructive pulmonary disease, unspecified: Secondary | ICD-10-CM | POA: Diagnosis not present

## 2022-10-31 DIAGNOSIS — Z9181 History of falling: Secondary | ICD-10-CM | POA: Diagnosis not present

## 2022-10-31 DIAGNOSIS — Z86711 Personal history of pulmonary embolism: Secondary | ICD-10-CM | POA: Diagnosis not present

## 2022-11-04 ENCOUNTER — Encounter: Payer: Self-pay | Admitting: Family Medicine

## 2022-11-04 DIAGNOSIS — C3411 Malignant neoplasm of upper lobe, right bronchus or lung: Secondary | ICD-10-CM | POA: Diagnosis not present

## 2022-11-04 DIAGNOSIS — I2510 Atherosclerotic heart disease of native coronary artery without angina pectoris: Secondary | ICD-10-CM | POA: Diagnosis not present

## 2022-11-04 DIAGNOSIS — Z556 Problems related to health literacy: Secondary | ICD-10-CM | POA: Diagnosis not present

## 2022-11-04 DIAGNOSIS — E785 Hyperlipidemia, unspecified: Secondary | ICD-10-CM | POA: Diagnosis not present

## 2022-11-04 DIAGNOSIS — Z86718 Personal history of other venous thrombosis and embolism: Secondary | ICD-10-CM | POA: Diagnosis not present

## 2022-11-04 DIAGNOSIS — I255 Ischemic cardiomyopathy: Secondary | ICD-10-CM | POA: Diagnosis not present

## 2022-11-04 DIAGNOSIS — C3431 Malignant neoplasm of lower lobe, right bronchus or lung: Secondary | ICD-10-CM | POA: Diagnosis not present

## 2022-11-04 DIAGNOSIS — R69 Illness, unspecified: Secondary | ICD-10-CM | POA: Diagnosis not present

## 2022-11-04 DIAGNOSIS — I5042 Chronic combined systolic (congestive) and diastolic (congestive) heart failure: Secondary | ICD-10-CM | POA: Diagnosis not present

## 2022-11-04 DIAGNOSIS — C3412 Malignant neoplasm of upper lobe, left bronchus or lung: Secondary | ICD-10-CM | POA: Diagnosis not present

## 2022-11-04 DIAGNOSIS — Z9181 History of falling: Secondary | ICD-10-CM | POA: Diagnosis not present

## 2022-11-04 DIAGNOSIS — Z7901 Long term (current) use of anticoagulants: Secondary | ICD-10-CM | POA: Diagnosis not present

## 2022-11-04 DIAGNOSIS — D692 Other nonthrombocytopenic purpura: Secondary | ICD-10-CM | POA: Diagnosis not present

## 2022-11-04 DIAGNOSIS — J449 Chronic obstructive pulmonary disease, unspecified: Secondary | ICD-10-CM | POA: Diagnosis not present

## 2022-11-04 DIAGNOSIS — I252 Old myocardial infarction: Secondary | ICD-10-CM | POA: Diagnosis not present

## 2022-11-04 DIAGNOSIS — J9611 Chronic respiratory failure with hypoxia: Secondary | ICD-10-CM | POA: Diagnosis not present

## 2022-11-04 DIAGNOSIS — Z9981 Dependence on supplemental oxygen: Secondary | ICD-10-CM | POA: Diagnosis not present

## 2022-11-04 DIAGNOSIS — I110 Hypertensive heart disease with heart failure: Secondary | ICD-10-CM | POA: Diagnosis not present

## 2022-11-04 DIAGNOSIS — Z86711 Personal history of pulmonary embolism: Secondary | ICD-10-CM | POA: Diagnosis not present

## 2022-11-04 DIAGNOSIS — H3530 Unspecified macular degeneration: Secondary | ICD-10-CM | POA: Diagnosis not present

## 2022-11-05 ENCOUNTER — Telehealth: Payer: Self-pay | Admitting: *Deleted

## 2022-11-05 DIAGNOSIS — Z86711 Personal history of pulmonary embolism: Secondary | ICD-10-CM

## 2022-11-05 DIAGNOSIS — Z86718 Personal history of other venous thrombosis and embolism: Secondary | ICD-10-CM

## 2022-11-05 NOTE — Telephone Encounter (Signed)
Description   Continue weekly dose to take half a tablet of the 3 mg tablets every Monday,, Tuesday, Wednesday  and Friday and continue 3 mg tablets the rest of the week.  INR  2.7 at goal (goal is 2-3) Recheck in 1-2 weeks, checks at home       Caryl Pina, MD [place] 11/05/2022, 8:22 AM

## 2022-11-05 NOTE — Telephone Encounter (Signed)
Fax received mdINR PT/INR self testing service Test date/time 11/05/22 528 am INR 2.7

## 2022-11-05 NOTE — Telephone Encounter (Signed)
Patient's wife aware of instruction

## 2022-11-06 DIAGNOSIS — Z86711 Personal history of pulmonary embolism: Secondary | ICD-10-CM | POA: Diagnosis not present

## 2022-11-06 DIAGNOSIS — I252 Old myocardial infarction: Secondary | ICD-10-CM | POA: Diagnosis not present

## 2022-11-06 DIAGNOSIS — R69 Illness, unspecified: Secondary | ICD-10-CM | POA: Diagnosis not present

## 2022-11-06 DIAGNOSIS — I255 Ischemic cardiomyopathy: Secondary | ICD-10-CM | POA: Diagnosis not present

## 2022-11-06 DIAGNOSIS — C3431 Malignant neoplasm of lower lobe, right bronchus or lung: Secondary | ICD-10-CM | POA: Diagnosis not present

## 2022-11-06 DIAGNOSIS — D692 Other nonthrombocytopenic purpura: Secondary | ICD-10-CM | POA: Diagnosis not present

## 2022-11-06 DIAGNOSIS — Z7901 Long term (current) use of anticoagulants: Secondary | ICD-10-CM | POA: Diagnosis not present

## 2022-11-06 DIAGNOSIS — Z86718 Personal history of other venous thrombosis and embolism: Secondary | ICD-10-CM | POA: Diagnosis not present

## 2022-11-06 DIAGNOSIS — C3411 Malignant neoplasm of upper lobe, right bronchus or lung: Secondary | ICD-10-CM | POA: Diagnosis not present

## 2022-11-06 DIAGNOSIS — I2510 Atherosclerotic heart disease of native coronary artery without angina pectoris: Secondary | ICD-10-CM | POA: Diagnosis not present

## 2022-11-06 DIAGNOSIS — Z556 Problems related to health literacy: Secondary | ICD-10-CM | POA: Diagnosis not present

## 2022-11-06 DIAGNOSIS — H3530 Unspecified macular degeneration: Secondary | ICD-10-CM | POA: Diagnosis not present

## 2022-11-06 DIAGNOSIS — I5042 Chronic combined systolic (congestive) and diastolic (congestive) heart failure: Secondary | ICD-10-CM | POA: Diagnosis not present

## 2022-11-06 DIAGNOSIS — J9611 Chronic respiratory failure with hypoxia: Secondary | ICD-10-CM | POA: Diagnosis not present

## 2022-11-06 DIAGNOSIS — E785 Hyperlipidemia, unspecified: Secondary | ICD-10-CM | POA: Diagnosis not present

## 2022-11-06 DIAGNOSIS — Z9181 History of falling: Secondary | ICD-10-CM | POA: Diagnosis not present

## 2022-11-06 DIAGNOSIS — J449 Chronic obstructive pulmonary disease, unspecified: Secondary | ICD-10-CM | POA: Diagnosis not present

## 2022-11-06 DIAGNOSIS — Z9981 Dependence on supplemental oxygen: Secondary | ICD-10-CM | POA: Diagnosis not present

## 2022-11-06 DIAGNOSIS — I110 Hypertensive heart disease with heart failure: Secondary | ICD-10-CM | POA: Diagnosis not present

## 2022-11-06 DIAGNOSIS — C3412 Malignant neoplasm of upper lobe, left bronchus or lung: Secondary | ICD-10-CM | POA: Diagnosis not present

## 2022-11-11 DIAGNOSIS — I255 Ischemic cardiomyopathy: Secondary | ICD-10-CM | POA: Diagnosis not present

## 2022-11-11 DIAGNOSIS — I2510 Atherosclerotic heart disease of native coronary artery without angina pectoris: Secondary | ICD-10-CM | POA: Diagnosis not present

## 2022-11-11 DIAGNOSIS — Z86711 Personal history of pulmonary embolism: Secondary | ICD-10-CM | POA: Diagnosis not present

## 2022-11-11 DIAGNOSIS — J449 Chronic obstructive pulmonary disease, unspecified: Secondary | ICD-10-CM | POA: Diagnosis not present

## 2022-11-11 DIAGNOSIS — Z9181 History of falling: Secondary | ICD-10-CM | POA: Diagnosis not present

## 2022-11-11 DIAGNOSIS — C3411 Malignant neoplasm of upper lobe, right bronchus or lung: Secondary | ICD-10-CM | POA: Diagnosis not present

## 2022-11-11 DIAGNOSIS — R69 Illness, unspecified: Secondary | ICD-10-CM | POA: Diagnosis not present

## 2022-11-11 DIAGNOSIS — J9611 Chronic respiratory failure with hypoxia: Secondary | ICD-10-CM | POA: Diagnosis not present

## 2022-11-11 DIAGNOSIS — I252 Old myocardial infarction: Secondary | ICD-10-CM | POA: Diagnosis not present

## 2022-11-11 DIAGNOSIS — Z86718 Personal history of other venous thrombosis and embolism: Secondary | ICD-10-CM | POA: Diagnosis not present

## 2022-11-11 DIAGNOSIS — E785 Hyperlipidemia, unspecified: Secondary | ICD-10-CM | POA: Diagnosis not present

## 2022-11-11 DIAGNOSIS — Z556 Problems related to health literacy: Secondary | ICD-10-CM | POA: Diagnosis not present

## 2022-11-11 DIAGNOSIS — Z7901 Long term (current) use of anticoagulants: Secondary | ICD-10-CM | POA: Diagnosis not present

## 2022-11-11 DIAGNOSIS — H3530 Unspecified macular degeneration: Secondary | ICD-10-CM | POA: Diagnosis not present

## 2022-11-11 DIAGNOSIS — C3412 Malignant neoplasm of upper lobe, left bronchus or lung: Secondary | ICD-10-CM | POA: Diagnosis not present

## 2022-11-11 DIAGNOSIS — D692 Other nonthrombocytopenic purpura: Secondary | ICD-10-CM | POA: Diagnosis not present

## 2022-11-11 DIAGNOSIS — I110 Hypertensive heart disease with heart failure: Secondary | ICD-10-CM | POA: Diagnosis not present

## 2022-11-11 DIAGNOSIS — Z9981 Dependence on supplemental oxygen: Secondary | ICD-10-CM | POA: Diagnosis not present

## 2022-11-11 DIAGNOSIS — I5042 Chronic combined systolic (congestive) and diastolic (congestive) heart failure: Secondary | ICD-10-CM | POA: Diagnosis not present

## 2022-11-11 DIAGNOSIS — C3431 Malignant neoplasm of lower lobe, right bronchus or lung: Secondary | ICD-10-CM | POA: Diagnosis not present

## 2022-11-12 DIAGNOSIS — Z7901 Long term (current) use of anticoagulants: Secondary | ICD-10-CM | POA: Diagnosis not present

## 2022-11-12 DIAGNOSIS — Z86718 Personal history of other venous thrombosis and embolism: Secondary | ICD-10-CM | POA: Diagnosis not present

## 2022-11-13 DIAGNOSIS — Z86711 Personal history of pulmonary embolism: Secondary | ICD-10-CM | POA: Diagnosis not present

## 2022-11-13 DIAGNOSIS — J449 Chronic obstructive pulmonary disease, unspecified: Secondary | ICD-10-CM | POA: Diagnosis not present

## 2022-11-13 DIAGNOSIS — Z556 Problems related to health literacy: Secondary | ICD-10-CM | POA: Diagnosis not present

## 2022-11-13 DIAGNOSIS — D692 Other nonthrombocytopenic purpura: Secondary | ICD-10-CM | POA: Diagnosis not present

## 2022-11-13 DIAGNOSIS — C3411 Malignant neoplasm of upper lobe, right bronchus or lung: Secondary | ICD-10-CM | POA: Diagnosis not present

## 2022-11-13 DIAGNOSIS — H3530 Unspecified macular degeneration: Secondary | ICD-10-CM | POA: Diagnosis not present

## 2022-11-13 DIAGNOSIS — I5042 Chronic combined systolic (congestive) and diastolic (congestive) heart failure: Secondary | ICD-10-CM | POA: Diagnosis not present

## 2022-11-13 DIAGNOSIS — I110 Hypertensive heart disease with heart failure: Secondary | ICD-10-CM | POA: Diagnosis not present

## 2022-11-13 DIAGNOSIS — J9611 Chronic respiratory failure with hypoxia: Secondary | ICD-10-CM | POA: Diagnosis not present

## 2022-11-13 DIAGNOSIS — C3431 Malignant neoplasm of lower lobe, right bronchus or lung: Secondary | ICD-10-CM | POA: Diagnosis not present

## 2022-11-13 DIAGNOSIS — I2510 Atherosclerotic heart disease of native coronary artery without angina pectoris: Secondary | ICD-10-CM | POA: Diagnosis not present

## 2022-11-13 DIAGNOSIS — Z86718 Personal history of other venous thrombosis and embolism: Secondary | ICD-10-CM | POA: Diagnosis not present

## 2022-11-13 DIAGNOSIS — I252 Old myocardial infarction: Secondary | ICD-10-CM | POA: Diagnosis not present

## 2022-11-13 DIAGNOSIS — C3412 Malignant neoplasm of upper lobe, left bronchus or lung: Secondary | ICD-10-CM | POA: Diagnosis not present

## 2022-11-13 DIAGNOSIS — Z9981 Dependence on supplemental oxygen: Secondary | ICD-10-CM | POA: Diagnosis not present

## 2022-11-13 DIAGNOSIS — R69 Illness, unspecified: Secondary | ICD-10-CM | POA: Diagnosis not present

## 2022-11-13 DIAGNOSIS — I255 Ischemic cardiomyopathy: Secondary | ICD-10-CM | POA: Diagnosis not present

## 2022-11-13 DIAGNOSIS — Z7901 Long term (current) use of anticoagulants: Secondary | ICD-10-CM | POA: Diagnosis not present

## 2022-11-13 DIAGNOSIS — E785 Hyperlipidemia, unspecified: Secondary | ICD-10-CM | POA: Diagnosis not present

## 2022-11-13 DIAGNOSIS — Z9181 History of falling: Secondary | ICD-10-CM | POA: Diagnosis not present

## 2022-11-14 ENCOUNTER — Telehealth: Payer: Self-pay | Admitting: *Deleted

## 2022-11-14 DIAGNOSIS — Z86711 Personal history of pulmonary embolism: Secondary | ICD-10-CM

## 2022-11-14 DIAGNOSIS — Z86718 Personal history of other venous thrombosis and embolism: Secondary | ICD-10-CM

## 2022-11-14 NOTE — Telephone Encounter (Signed)
Pts wife has been informed and has no concerns.

## 2022-11-14 NOTE — Telephone Encounter (Signed)
Fax received mdINR PT/INR self testing service Test date/time 11/12/22 616pm INR 2.5

## 2022-11-14 NOTE — Telephone Encounter (Signed)
Description   Continue weekly dose to take half a tablet of the 3 mg tablets every Monday,, Tuesday, Wednesday  and Friday and continue 3 mg tablets the rest of the week.  INR  2.5 at goal (goal is 2-3) Recheck in 1-2 weeks, checks at home       Caryl Pina, MD [place] 11/14/2022, 9:16 AM

## 2022-11-18 ENCOUNTER — Ambulatory Visit (INDEPENDENT_AMBULATORY_CARE_PROVIDER_SITE_OTHER): Payer: Medicare HMO | Admitting: *Deleted

## 2022-11-18 DIAGNOSIS — C3412 Malignant neoplasm of upper lobe, left bronchus or lung: Secondary | ICD-10-CM | POA: Diagnosis not present

## 2022-11-18 DIAGNOSIS — I5042 Chronic combined systolic (congestive) and diastolic (congestive) heart failure: Secondary | ICD-10-CM | POA: Diagnosis not present

## 2022-11-18 DIAGNOSIS — Z556 Problems related to health literacy: Secondary | ICD-10-CM | POA: Diagnosis not present

## 2022-11-18 DIAGNOSIS — Z9181 History of falling: Secondary | ICD-10-CM | POA: Diagnosis not present

## 2022-11-18 DIAGNOSIS — Z23 Encounter for immunization: Secondary | ICD-10-CM | POA: Diagnosis not present

## 2022-11-18 DIAGNOSIS — I255 Ischemic cardiomyopathy: Secondary | ICD-10-CM | POA: Diagnosis not present

## 2022-11-18 DIAGNOSIS — J449 Chronic obstructive pulmonary disease, unspecified: Secondary | ICD-10-CM | POA: Diagnosis not present

## 2022-11-18 DIAGNOSIS — C3411 Malignant neoplasm of upper lobe, right bronchus or lung: Secondary | ICD-10-CM | POA: Diagnosis not present

## 2022-11-18 DIAGNOSIS — R69 Illness, unspecified: Secondary | ICD-10-CM | POA: Diagnosis not present

## 2022-11-18 DIAGNOSIS — Z9981 Dependence on supplemental oxygen: Secondary | ICD-10-CM | POA: Diagnosis not present

## 2022-11-18 DIAGNOSIS — I252 Old myocardial infarction: Secondary | ICD-10-CM | POA: Diagnosis not present

## 2022-11-18 DIAGNOSIS — Z7901 Long term (current) use of anticoagulants: Secondary | ICD-10-CM | POA: Diagnosis not present

## 2022-11-18 DIAGNOSIS — J9611 Chronic respiratory failure with hypoxia: Secondary | ICD-10-CM | POA: Diagnosis not present

## 2022-11-18 DIAGNOSIS — D692 Other nonthrombocytopenic purpura: Secondary | ICD-10-CM | POA: Diagnosis not present

## 2022-11-18 DIAGNOSIS — H3530 Unspecified macular degeneration: Secondary | ICD-10-CM | POA: Diagnosis not present

## 2022-11-18 DIAGNOSIS — Z86711 Personal history of pulmonary embolism: Secondary | ICD-10-CM | POA: Diagnosis not present

## 2022-11-18 DIAGNOSIS — I110 Hypertensive heart disease with heart failure: Secondary | ICD-10-CM | POA: Diagnosis not present

## 2022-11-18 DIAGNOSIS — E785 Hyperlipidemia, unspecified: Secondary | ICD-10-CM | POA: Diagnosis not present

## 2022-11-18 DIAGNOSIS — C3431 Malignant neoplasm of lower lobe, right bronchus or lung: Secondary | ICD-10-CM | POA: Diagnosis not present

## 2022-11-18 DIAGNOSIS — I2510 Atherosclerotic heart disease of native coronary artery without angina pectoris: Secondary | ICD-10-CM | POA: Diagnosis not present

## 2022-11-18 DIAGNOSIS — Z86718 Personal history of other venous thrombosis and embolism: Secondary | ICD-10-CM | POA: Diagnosis not present

## 2022-11-18 NOTE — Patient Instructions (Addendum)
Given by home health nurse, Myra Rude, [place], [facility] [phone]

## 2022-11-19 ENCOUNTER — Telehealth: Payer: Self-pay | Admitting: *Deleted

## 2022-11-19 ENCOUNTER — Telehealth: Payer: Self-pay | Admitting: Family Medicine

## 2022-11-19 DIAGNOSIS — Z86711 Personal history of pulmonary embolism: Secondary | ICD-10-CM

## 2022-11-19 DIAGNOSIS — Z86718 Personal history of other venous thrombosis and embolism: Secondary | ICD-10-CM

## 2022-11-19 NOTE — Telephone Encounter (Signed)
Description   Continue weekly dose to take half a tablet of the 3 mg tablets every Monday,, Tuesday, Wednesday  and Friday and continue 3 mg tablets the rest of the week.  INR  2.1 at goal (goal is 2-3) Recheck in 1-2 weeks, checks at home       Caryl Pina, MD [place] 11/19/2022, 10:33 AM

## 2022-11-19 NOTE — Telephone Encounter (Signed)
Spoke with wife - she has been made aware

## 2022-11-19 NOTE — Telephone Encounter (Signed)
CALLED PATIENT'S WIFE- [name] Bango TO INFORM OF CT FOR 11-28-22- ARRIVAL TIME- 3:15 PM @ [facility], WATER ONLY 4 HRS. PRIOR TO TEST, PATIENT TO RECEIVE RESULTS FROM [facility] ON 12-04-22 @ 11 AM, SPOKE WITH WIFE [name] Tozer AND SHE IS AWARE OF THESE APPTS. AND THE INSTRUCTIONS

## 2022-11-21 DIAGNOSIS — Z86711 Personal history of pulmonary embolism: Secondary | ICD-10-CM | POA: Diagnosis not present

## 2022-11-21 DIAGNOSIS — I252 Old myocardial infarction: Secondary | ICD-10-CM | POA: Diagnosis not present

## 2022-11-21 DIAGNOSIS — E785 Hyperlipidemia, unspecified: Secondary | ICD-10-CM | POA: Diagnosis not present

## 2022-11-21 DIAGNOSIS — J449 Chronic obstructive pulmonary disease, unspecified: Secondary | ICD-10-CM | POA: Diagnosis not present

## 2022-11-21 DIAGNOSIS — C3412 Malignant neoplasm of upper lobe, left bronchus or lung: Secondary | ICD-10-CM | POA: Diagnosis not present

## 2022-11-21 DIAGNOSIS — R69 Illness, unspecified: Secondary | ICD-10-CM | POA: Diagnosis not present

## 2022-11-21 DIAGNOSIS — Z86718 Personal history of other venous thrombosis and embolism: Secondary | ICD-10-CM | POA: Diagnosis not present

## 2022-11-21 DIAGNOSIS — H3530 Unspecified macular degeneration: Secondary | ICD-10-CM | POA: Diagnosis not present

## 2022-11-21 DIAGNOSIS — I255 Ischemic cardiomyopathy: Secondary | ICD-10-CM | POA: Diagnosis not present

## 2022-11-21 DIAGNOSIS — I5042 Chronic combined systolic (congestive) and diastolic (congestive) heart failure: Secondary | ICD-10-CM | POA: Diagnosis not present

## 2022-11-21 DIAGNOSIS — J9611 Chronic respiratory failure with hypoxia: Secondary | ICD-10-CM | POA: Diagnosis not present

## 2022-11-21 DIAGNOSIS — Z9981 Dependence on supplemental oxygen: Secondary | ICD-10-CM | POA: Diagnosis not present

## 2022-11-21 DIAGNOSIS — I2510 Atherosclerotic heart disease of native coronary artery without angina pectoris: Secondary | ICD-10-CM | POA: Diagnosis not present

## 2022-11-21 DIAGNOSIS — I110 Hypertensive heart disease with heart failure: Secondary | ICD-10-CM | POA: Diagnosis not present

## 2022-11-21 DIAGNOSIS — Z7901 Long term (current) use of anticoagulants: Secondary | ICD-10-CM | POA: Diagnosis not present

## 2022-11-21 DIAGNOSIS — D692 Other nonthrombocytopenic purpura: Secondary | ICD-10-CM | POA: Diagnosis not present

## 2022-11-21 DIAGNOSIS — C3411 Malignant neoplasm of upper lobe, right bronchus or lung: Secondary | ICD-10-CM | POA: Diagnosis not present

## 2022-11-21 DIAGNOSIS — Z9181 History of falling: Secondary | ICD-10-CM | POA: Diagnosis not present

## 2022-11-21 DIAGNOSIS — C3431 Malignant neoplasm of lower lobe, right bronchus or lung: Secondary | ICD-10-CM | POA: Diagnosis not present

## 2022-11-21 DIAGNOSIS — Z556 Problems related to health literacy: Secondary | ICD-10-CM | POA: Diagnosis not present

## 2022-11-24 DIAGNOSIS — I5042 Chronic combined systolic (congestive) and diastolic (congestive) heart failure: Secondary | ICD-10-CM | POA: Diagnosis not present

## 2022-11-24 DIAGNOSIS — E785 Hyperlipidemia, unspecified: Secondary | ICD-10-CM | POA: Diagnosis not present

## 2022-11-24 DIAGNOSIS — D692 Other nonthrombocytopenic purpura: Secondary | ICD-10-CM | POA: Diagnosis not present

## 2022-11-24 DIAGNOSIS — I110 Hypertensive heart disease with heart failure: Secondary | ICD-10-CM | POA: Diagnosis not present

## 2022-11-24 DIAGNOSIS — Z86711 Personal history of pulmonary embolism: Secondary | ICD-10-CM | POA: Diagnosis not present

## 2022-11-24 DIAGNOSIS — C3431 Malignant neoplasm of lower lobe, right bronchus or lung: Secondary | ICD-10-CM | POA: Diagnosis not present

## 2022-11-24 DIAGNOSIS — Z7901 Long term (current) use of anticoagulants: Secondary | ICD-10-CM | POA: Diagnosis not present

## 2022-11-24 DIAGNOSIS — J449 Chronic obstructive pulmonary disease, unspecified: Secondary | ICD-10-CM | POA: Diagnosis not present

## 2022-11-24 DIAGNOSIS — Z86718 Personal history of other venous thrombosis and embolism: Secondary | ICD-10-CM | POA: Diagnosis not present

## 2022-11-24 DIAGNOSIS — R69 Illness, unspecified: Secondary | ICD-10-CM | POA: Diagnosis not present

## 2022-11-24 DIAGNOSIS — Z556 Problems related to health literacy: Secondary | ICD-10-CM | POA: Diagnosis not present

## 2022-11-24 DIAGNOSIS — Z9181 History of falling: Secondary | ICD-10-CM | POA: Diagnosis not present

## 2022-11-24 DIAGNOSIS — I2510 Atherosclerotic heart disease of native coronary artery without angina pectoris: Secondary | ICD-10-CM | POA: Diagnosis not present

## 2022-11-24 DIAGNOSIS — I252 Old myocardial infarction: Secondary | ICD-10-CM | POA: Diagnosis not present

## 2022-11-24 DIAGNOSIS — I255 Ischemic cardiomyopathy: Secondary | ICD-10-CM | POA: Diagnosis not present

## 2022-11-24 DIAGNOSIS — H3530 Unspecified macular degeneration: Secondary | ICD-10-CM | POA: Diagnosis not present

## 2022-11-24 DIAGNOSIS — C3411 Malignant neoplasm of upper lobe, right bronchus or lung: Secondary | ICD-10-CM | POA: Diagnosis not present

## 2022-11-24 DIAGNOSIS — C3412 Malignant neoplasm of upper lobe, left bronchus or lung: Secondary | ICD-10-CM | POA: Diagnosis not present

## 2022-11-24 DIAGNOSIS — J9611 Chronic respiratory failure with hypoxia: Secondary | ICD-10-CM | POA: Diagnosis not present

## 2022-11-24 DIAGNOSIS — Z9981 Dependence on supplemental oxygen: Secondary | ICD-10-CM | POA: Diagnosis not present

## 2022-11-25 ENCOUNTER — Other Ambulatory Visit: Payer: Self-pay | Admitting: Urology

## 2022-11-25 DIAGNOSIS — C3411 Malignant neoplasm of upper lobe, right bronchus or lung: Secondary | ICD-10-CM

## 2022-11-25 DIAGNOSIS — C3431 Malignant neoplasm of lower lobe, right bronchus or lung: Secondary | ICD-10-CM

## 2022-11-25 DIAGNOSIS — C3412 Malignant neoplasm of upper lobe, left bronchus or lung: Secondary | ICD-10-CM

## 2022-11-26 ENCOUNTER — Ambulatory Visit (INDEPENDENT_AMBULATORY_CARE_PROVIDER_SITE_OTHER): Payer: Medicare HMO

## 2022-11-26 ENCOUNTER — Telehealth: Payer: Self-pay | Admitting: *Deleted

## 2022-11-26 DIAGNOSIS — I255 Ischemic cardiomyopathy: Secondary | ICD-10-CM

## 2022-11-26 DIAGNOSIS — I110 Hypertensive heart disease with heart failure: Secondary | ICD-10-CM | POA: Diagnosis not present

## 2022-11-26 DIAGNOSIS — F03918 Unspecified dementia, unspecified severity, with other behavioral disturbance: Secondary | ICD-10-CM

## 2022-11-26 DIAGNOSIS — H3530 Unspecified macular degeneration: Secondary | ICD-10-CM | POA: Diagnosis not present

## 2022-11-26 DIAGNOSIS — R69 Illness, unspecified: Secondary | ICD-10-CM | POA: Diagnosis not present

## 2022-11-26 DIAGNOSIS — C3412 Malignant neoplasm of upper lobe, left bronchus or lung: Secondary | ICD-10-CM | POA: Diagnosis not present

## 2022-11-26 DIAGNOSIS — C3431 Malignant neoplasm of lower lobe, right bronchus or lung: Secondary | ICD-10-CM | POA: Diagnosis not present

## 2022-11-26 DIAGNOSIS — C3411 Malignant neoplasm of upper lobe, right bronchus or lung: Secondary | ICD-10-CM | POA: Diagnosis not present

## 2022-11-26 DIAGNOSIS — Z86718 Personal history of other venous thrombosis and embolism: Secondary | ICD-10-CM

## 2022-11-26 DIAGNOSIS — E785 Hyperlipidemia, unspecified: Secondary | ICD-10-CM | POA: Diagnosis not present

## 2022-11-26 DIAGNOSIS — J449 Chronic obstructive pulmonary disease, unspecified: Secondary | ICD-10-CM

## 2022-11-26 DIAGNOSIS — Z86711 Personal history of pulmonary embolism: Secondary | ICD-10-CM

## 2022-11-26 DIAGNOSIS — D692 Other nonthrombocytopenic purpura: Secondary | ICD-10-CM | POA: Diagnosis not present

## 2022-11-26 DIAGNOSIS — J9611 Chronic respiratory failure with hypoxia: Secondary | ICD-10-CM | POA: Diagnosis not present

## 2022-11-26 DIAGNOSIS — I5042 Chronic combined systolic (congestive) and diastolic (congestive) heart failure: Secondary | ICD-10-CM | POA: Diagnosis not present

## 2022-11-26 DIAGNOSIS — I252 Old myocardial infarction: Secondary | ICD-10-CM

## 2022-11-26 NOTE — Telephone Encounter (Signed)
Fax received mdINR PT/INR self testing service Test date/time 11/26/22 650 am INR 2.1

## 2022-11-26 NOTE — Telephone Encounter (Signed)
Patient aware and verbalized understanding. °

## 2022-11-26 NOTE — Telephone Encounter (Signed)
Description   Continue weekly dose to take half a tablet of the 3 mg tablets every Monday,, Tuesday, Wednesday  and Friday and continue 3 mg tablets the rest of the week.  INR  2.1 at goal (goal is 2-3) Recheck in 1-2 weeks, checks at home        Caryl Pina, MD [place] 11/26/2022, 7:58 AM

## 2022-11-28 ENCOUNTER — Ambulatory Visit (HOSPITAL_COMMUNITY)
Admission: RE | Admit: 2022-11-28 | Discharge: 2022-11-28 | Disposition: A | Discharge status: Home / Self Care | Payer: Medicare HMO | Source: Ambulatory Visit | Attending: Urology | Admitting: Urology

## 2022-11-28 DIAGNOSIS — J439 Emphysema, unspecified: Secondary | ICD-10-CM | POA: Diagnosis not present

## 2022-11-28 DIAGNOSIS — R918 Other nonspecific abnormal finding of lung field: Secondary | ICD-10-CM | POA: Diagnosis not present

## 2022-11-28 DIAGNOSIS — C3412 Malignant neoplasm of upper lobe, left bronchus or lung: Secondary | ICD-10-CM | POA: Insufficient documentation

## 2022-11-28 MED ORDER — IOHEXOL 300 MG/ML  SOLN
75.0000 mL | Freq: Once | INTRAMUSCULAR | Status: AC | PRN
  Administered 2022-11-28: 75 mL via INTRAVENOUS

## 2022-12-02 DIAGNOSIS — E785 Hyperlipidemia, unspecified: Secondary | ICD-10-CM | POA: Diagnosis not present

## 2022-12-02 DIAGNOSIS — Z9181 History of falling: Secondary | ICD-10-CM | POA: Diagnosis not present

## 2022-12-02 DIAGNOSIS — J449 Chronic obstructive pulmonary disease, unspecified: Secondary | ICD-10-CM | POA: Diagnosis not present

## 2022-12-02 DIAGNOSIS — I252 Old myocardial infarction: Secondary | ICD-10-CM | POA: Diagnosis not present

## 2022-12-02 DIAGNOSIS — Z86711 Personal history of pulmonary embolism: Secondary | ICD-10-CM | POA: Diagnosis not present

## 2022-12-02 DIAGNOSIS — R69 Illness, unspecified: Secondary | ICD-10-CM | POA: Diagnosis not present

## 2022-12-02 DIAGNOSIS — C3411 Malignant neoplasm of upper lobe, right bronchus or lung: Secondary | ICD-10-CM | POA: Diagnosis not present

## 2022-12-02 DIAGNOSIS — Z9981 Dependence on supplemental oxygen: Secondary | ICD-10-CM | POA: Diagnosis not present

## 2022-12-02 DIAGNOSIS — C3431 Malignant neoplasm of lower lobe, right bronchus or lung: Secondary | ICD-10-CM | POA: Diagnosis not present

## 2022-12-02 DIAGNOSIS — I110 Hypertensive heart disease with heart failure: Secondary | ICD-10-CM | POA: Diagnosis not present

## 2022-12-02 DIAGNOSIS — D692 Other nonthrombocytopenic purpura: Secondary | ICD-10-CM | POA: Diagnosis not present

## 2022-12-02 DIAGNOSIS — Z7901 Long term (current) use of anticoagulants: Secondary | ICD-10-CM | POA: Diagnosis not present

## 2022-12-02 DIAGNOSIS — I255 Ischemic cardiomyopathy: Secondary | ICD-10-CM | POA: Diagnosis not present

## 2022-12-02 DIAGNOSIS — I5042 Chronic combined systolic (congestive) and diastolic (congestive) heart failure: Secondary | ICD-10-CM | POA: Diagnosis not present

## 2022-12-02 DIAGNOSIS — I2510 Atherosclerotic heart disease of native coronary artery without angina pectoris: Secondary | ICD-10-CM | POA: Diagnosis not present

## 2022-12-02 DIAGNOSIS — Z86718 Personal history of other venous thrombosis and embolism: Secondary | ICD-10-CM | POA: Diagnosis not present

## 2022-12-02 DIAGNOSIS — C3412 Malignant neoplasm of upper lobe, left bronchus or lung: Secondary | ICD-10-CM | POA: Diagnosis not present

## 2022-12-02 DIAGNOSIS — J9611 Chronic respiratory failure with hypoxia: Secondary | ICD-10-CM | POA: Diagnosis not present

## 2022-12-02 DIAGNOSIS — H3530 Unspecified macular degeneration: Secondary | ICD-10-CM | POA: Diagnosis not present

## 2022-12-02 DIAGNOSIS — Z556 Problems related to health literacy: Secondary | ICD-10-CM | POA: Diagnosis not present

## 2022-12-03 ENCOUNTER — Telehealth: Payer: Self-pay | Admitting: *Deleted

## 2022-12-03 DIAGNOSIS — J449 Chronic obstructive pulmonary disease, unspecified: Secondary | ICD-10-CM | POA: Diagnosis not present

## 2022-12-03 DIAGNOSIS — Z9981 Dependence on supplemental oxygen: Secondary | ICD-10-CM | POA: Diagnosis not present

## 2022-12-03 DIAGNOSIS — I2510 Atherosclerotic heart disease of native coronary artery without angina pectoris: Secondary | ICD-10-CM | POA: Diagnosis not present

## 2022-12-03 DIAGNOSIS — R69 Illness, unspecified: Secondary | ICD-10-CM | POA: Diagnosis not present

## 2022-12-03 DIAGNOSIS — Z9181 History of falling: Secondary | ICD-10-CM | POA: Diagnosis not present

## 2022-12-03 DIAGNOSIS — Z86718 Personal history of other venous thrombosis and embolism: Secondary | ICD-10-CM

## 2022-12-03 DIAGNOSIS — I252 Old myocardial infarction: Secondary | ICD-10-CM | POA: Diagnosis not present

## 2022-12-03 DIAGNOSIS — I110 Hypertensive heart disease with heart failure: Secondary | ICD-10-CM | POA: Diagnosis not present

## 2022-12-03 DIAGNOSIS — Z86711 Personal history of pulmonary embolism: Secondary | ICD-10-CM | POA: Diagnosis not present

## 2022-12-03 DIAGNOSIS — H3530 Unspecified macular degeneration: Secondary | ICD-10-CM | POA: Diagnosis not present

## 2022-12-03 DIAGNOSIS — I255 Ischemic cardiomyopathy: Secondary | ICD-10-CM | POA: Diagnosis not present

## 2022-12-03 DIAGNOSIS — C3431 Malignant neoplasm of lower lobe, right bronchus or lung: Secondary | ICD-10-CM | POA: Diagnosis not present

## 2022-12-03 DIAGNOSIS — I5042 Chronic combined systolic (congestive) and diastolic (congestive) heart failure: Secondary | ICD-10-CM | POA: Diagnosis not present

## 2022-12-03 DIAGNOSIS — E785 Hyperlipidemia, unspecified: Secondary | ICD-10-CM | POA: Diagnosis not present

## 2022-12-03 DIAGNOSIS — C3412 Malignant neoplasm of upper lobe, left bronchus or lung: Secondary | ICD-10-CM | POA: Diagnosis not present

## 2022-12-03 DIAGNOSIS — C3411 Malignant neoplasm of upper lobe, right bronchus or lung: Secondary | ICD-10-CM | POA: Diagnosis not present

## 2022-12-03 DIAGNOSIS — Z7901 Long term (current) use of anticoagulants: Secondary | ICD-10-CM | POA: Diagnosis not present

## 2022-12-03 DIAGNOSIS — Z556 Problems related to health literacy: Secondary | ICD-10-CM | POA: Diagnosis not present

## 2022-12-03 DIAGNOSIS — D692 Other nonthrombocytopenic purpura: Secondary | ICD-10-CM | POA: Diagnosis not present

## 2022-12-03 DIAGNOSIS — J9611 Chronic respiratory failure with hypoxia: Secondary | ICD-10-CM | POA: Diagnosis not present

## 2022-12-03 NOTE — Telephone Encounter (Signed)
Fax received mdINR PT/INR self testing service Test date/time 12/03/22 522 am INR 2.1

## 2022-12-03 NOTE — Progress Notes (Signed)
Radiation Oncology         [phone] ________________________________  Name: Travis Cruz MRN: [id number]  Date: 12/04/2022  DOB: 27-Jul-1946  Outpatient Follow up Visit  CC: Dettinger, Elige Radon, MD  Delight Ovens, MD  Diagnosis:  Putative metachronous stage IA NSCLC of the left upper lobe lung in patient with a history of putative NSCLC of the right upper lobe, left upper lobe NSCLC, squamous cell carcinoma, and putative NSCLC of the right lower lung - Clinical stage IA     Interval Since Last Radiation: 5 months   07/09/22 - 07/22/22:   The target in the LUL lung was treated to 60 Gy in 5 fractions of 12 Gy each  10/10/19, 10/14/19, 10/17/19//SBRT:  The RUL lung target was treated to 54 Gy in 3 fractions of 18 Gy  03/23/2017 to 04/02/2017:  The RLL target was treated to 50 Gy in 5 fractions of 10 Gy    11/07/13-11/16/13:  Left upper lobe / 60 Gy in 5 fractions at 12 Gray per fraction  Narrative:  I spoke with the patient's wife, Travis Cruz, due to his significant, progressive dementia, to conduct his routine scheduled 3 month follow up to review the results from his most recent CT Chest scan via telephone to spare the patient unnecessary potential exposure in the healthcare setting during the current COVID-19 pandemic.  The patient was notified in advance and gave permission to proceed with this visit format.   In summary, he has a history of  NSCLC, squamous cell carcinoma in the left upper lobe treated with a 5 fraction course of SBRT in 2014.  He tolerated this treatment well and remained disease-free on follow-up CT chest scans until 2018 when he was noted to have a new lesion in the right lower lobe lung, presumed to be a new, stage I NSCLC.due to his significant underlying COPD, he was not felt to be a good candidate for biopsy so the decision was made to treat this lesion with a 5 fraction course of SBRT, completed in 03/2017.  He again tolerated this treatment well and remained  disease-free until September 2020 when he was noted to have a new, 1.8 cm solid nodule in the posterior right upper lobe lung, suspicious for metastasis or metachronous primary bronchogenic carcinoma.  There was no thoracic adenopathy or other potential findings of metastatic disease in the chest.  PET scan was completed on 09/26/19 and demonstrated hypermetabolic activity in the posterior right upper lobe 1.8 cm pulmonary nodule compatible with pulmonary malignancy. Fortunately, there was no hypermetabolic thoracic adenopathy or distant metastatic disease and no hypermetabolic recurrence at the sites of previous radiation therapy in the perihilar left upper and right lower lung lobes.  Again, he was not felt to be high risk for surgery or biopsy given his underlying severe COPD, so the decision was to proceed with a 3 fraction course of SBRT to the RUL lung nodule, completed in October 2020.  Since that time, he has continued in routine surveillance with serial CT chest scans every 3-6 months.  He was hospitalized in 02/2020 and again in 11/2020 for acute hypoxic respiratory failure secondary to acute exacerbation of COPD and Covid in 11/2020. He was treated with multiple rounds of antibiotics and steroids which his wife feels has made an improvement in his breathing. He is followed with [facility], c/o Dr. Judeth Horn and previously with Dr. Karie Fetch.  His most recent visit with Dr. Judeth Horn was 05/13/22 and his  next follow up will be in 6 months.  Since his visit in 10/2021, he has remained clinically stable.   His most recent CT Chest from 05/22/2022 showed a 2.5 cm lobular nodule in the left upper lobe lung, concerning for recurrent disease.  There is an additional 1.2 cm subpleural nodule in the left upper lobe lung that could be postinfectious/postinflammatory.  Otherwise, the bilateral radiation fibrosis appeared grossly stable.  This was discussed with his wife, Travis Cruz, on 05/28/22 and the decision  was made to proceed with PET imaging for further evaluation. The PET was completed on 06/18/22 and confirms intense hypermetabolism in the LUL lung nodule, consistent with lung cancer recurrence. There were no hypermetabolic mediastinal nodal metastasis or distant metastatic disease. We reviewed the results and the recommendation for a 3 fraction course of SBRT to the left upper lobe lung lesion which they were in agreement with.  His treatments were completed on 07/22/2022 and he tolerated these very well with only mild to moderate fatigue.  His post-treatment CT Chest from 11/28/22 shows a positive response to therapy with slight regression of previously noted left upper lobe pulmonary nodule, with what appears to be evolving surrounding postradiation changes in the adjacent lung. Other chronic postradiation changes are stable and there is no lymphadenopathy or other suspicious findings to suggest disease progression or recurrence.  On review of systems, obtained from his wife, Travis Cruz, due to patient's dementia, the patient states that he is doing fair and is currently without any new respiratory complaints.  She reports that he has been pretty stable recently with regards to his breathing and the dementia.  He specifically denies dysphagia, chest pain, productive cough, hemoptysis, fever, chills, N/V.  He reports a decent appetite and is working on maintaining his weight.  His energy level is low but overall stable.  He continues on 3-4 L of oxygen via nasal cannula 24/7.  He denies fever, chills or night sweats.  ALLERGIES:  is allergic to ativan [lorazepam], cefuroxime axetil, and zinacef [cefuroxime].  Meds: Current Outpatient Medications  Medication Sig Dispense Refill   acetaminophen (TYLENOL) 325 MG tablet Take 2 tablets (650 mg total) by mouth every 6 (six) hours as needed for mild pain, fever or headache (or Fever >/= 101). 12 tablet 9   albuterol (VENTOLIN HFA) 108 (90 Base) MCG/ACT inhaler Inhale 2  puffs into the lungs every 4 (four) hours as needed for wheezing or shortness of breath. 18 g 11   ascorbic acid (VITAMIN C) 500 MG tablet Take 1 tablet (500 mg total) by mouth daily. 30 tablet 0   atorvastatin (LIPITOR) 40 MG tablet Take 1 tablet (40 mg total) by mouth 2 (two) times a week. 45 tablet 1   azithromycin (ZITHROMAX) 250 MG tablet Take 1 tablet (250 mg total) by mouth 3 (three) times a week. Monday Wednesday Friday. 30 tablet 4   Cholecalciferol (VITAMIN D3) 2000 units TABS Take 2,000 Units by mouth daily.      finasteride (PROSCAR) 5 MG tablet Take 1 tablet (5 mg total) by mouth daily. 90 tablet 3   Fluticasone-Umeclidin-Vilant (TRELEGY ELLIPTA) 100-62.5-25 MCG/ACT AEPB Inhale 1 puff into the lungs daily. 60 each 5   Fluticasone-Umeclidin-Vilant (TRELEGY ELLIPTA) 100-62.5-25 MCG/INH AEPB Inhale 1 puff into the lungs daily. 60 each 11   furosemide (LASIX) 20 MG tablet TAKE 2 TABLETS BY MOUTH EVERY DAY 180 tablet 1   metoprolol tartrate (LOPRESSOR) 25 MG tablet TAKE 1.5 TABLETS (37.5 MG TOTAL) BY MOUTH TWICE A  DAY 270 tablet 0   MODERNA COVID-19 BIVAL BOOSTER 50 MCG/0.5ML injection      Multiple Vitamin (MULTIVITAMIN WITH MINERALS) TABS tablet Take 1 tablet by mouth daily.     Multiple Vitamins-Minerals (PRESERVISION AREDS PO) Take 1 capsule by mouth 2 (two) times daily.     OXYGEN Inhale 3 L/L into the lungs.     sertraline (ZOLOFT) 100 MG tablet TAKE 1 TABLET BY MOUTH EVERY DAY 90 tablet 1   sulfamethoxazole-trimethoprim (BACTRIM DS) 800-160 MG tablet Take 1 tablet by mouth 2 (two) times daily. 20 tablet 0   tamsulosin (FLOMAX) 0.4 MG CAPS capsule TAKE 1 CAPSULE BY MOUTH EVERY DAY AFTER SUPPER 90 capsule 1   warfarin (COUMADIN) 3 MG tablet TAKE 1/2 TAB ON MON & FRI, AND 1 TAB (3 MG) ON ALL OTHER DAYS 78 tablet 2   No current facility-administered medications for this visit.    Physical Findings:  vitals were not taken for this visit.   /Unable to assess due to telephone  follow-up visit format.  Lab Findings: Lab Results  Component Value Date   WBC 14.9 (H) 05/01/2022   HGB 16.1 05/01/2022   HCT 49.3 05/01/2022   MCV 88 05/01/2022   PLT 325 05/01/2022     Radiographic Findings: CT Chest W Contrast  Result Date: 11/30/2022 CLINICAL DATA:  76 year old male with history of non-small cell lung cancer. Follow-up study. * Tracking Code: BO * EXAM: CT CHEST WITH CONTRAST TECHNIQUE: Multidetector CT imaging of the chest was performed during intravenous contrast administration. RADIATION DOSE REDUCTION: This exam was performed according to the departmental dose-optimization program which includes automated exposure control, adjustment of the mA and/or kV according to patient size and/or use of iterative reconstruction technique. CONTRAST:  75mL OMNIPAQUE IOHEXOL 300 MG/ML  SOLN COMPARISON:  Chest CT 05/22/2022.  PET-CT 06/18/2022. FINDINGS: Cardiovascular: Heart size is normal. There is no significant pericardial fluid, thickening or pericardial calcification. There is aortic atherosclerosis, as well as atherosclerosis of the great vessels of the mediastinum and the coronary arteries, including calcified atherosclerotic plaque in the left main, left anterior descending, left circumflex and right coronary arteries. Right-sided pacemaker/AICD device with single lead entering via left subclavian vein approach, terminating with tip in the apex of the right ventricle. Mediastinum/Nodes: No pathologically enlarged mediastinal or hilar lymph nodes. Esophagus is unremarkable in appearance. Severe tracheomalacia. No axillary lymphadenopathy. Lungs/Pleura: Previously noted left upper lobe nodule is less bulky than the prior examination, currently measuring 2.6 x 1.4 cm (axial image 56 of series 2) as compared with 2.5 x 2.5 cm on prior chest CT 05/22/2022. Surrounding areas of ground-glass attenuation, septal thickening and architectural distortion in the adjacent left lung, most  compatible with evolving postradiation changes. Chronic areas of mass-like architectural distortion and chronic volume loss are also noted in the posterior aspect of the left upper lobe and posterior aspect of the right upper lobe, similar to numerous prior examinations, most compatible with areas of chronic postradiation fibrosis. No acute consolidative airspace disease. No pleural effusions. Mild diffuse bronchial wall thickening with moderate centrilobular and paraseptal emphysema. Upper Abdomen: Multiple calcified gallstones lying dependently in the gallbladder. A few scattered colonic diverticula are noted in the visualized portions of the colon. Musculoskeletal: Multiple chronic appearing compression fractures are noted at T6, T10, T12 and L1, similar to the prior study, most severe at T6 where there is a vertebral plana appearance. Well-circumscribed superficial low-attenuation lesion measuring 2.6 x 1.7 cm in the subcutaneous fat of the  anterior chest wall inferiorly (axial image 109 of series 2), similar to the prior study, presumably a sebaceous cyst. There are no aggressive appearing lytic or blastic lesions noted in the visualized portions of the skeleton. IMPRESSION: 1. Today's study demonstrates a positive response to therapy with slight regression of previously noted left upper lobe pulmonary nodule, with what appears to be evolving surrounding postradiation changes in the adjacent lung. Other chronic postradiation changes are also similar to prior examinations, as above. No lymphadenopathy or other suspicious findings on today's examination. 2. Diffuse bronchial wall thickening with moderate centrilobular and paraseptal emphysema; imaging findings suggestive of underlying COPD. 3. Severe tracheomalacia. 4. Aortic atherosclerosis, in addition to left main and three-vessel coronary artery disease. Assessment for potential risk factor modification, dietary therapy or pharmacologic therapy may be  warranted, if clinically indicated. 5. Cholelithiasis. 6. Colonic diverticulosis. Aortic Atherosclerosis (ICD10-I70.0) and Emphysema (ICD10-J43.9). Electronically Signed   By: Trudie Reed M.D.   On: 11/30/2022 12:59    Impression/Plan: 1. 76 y/o male with putative stage IA NSCLC in the left upper lobe lung with a history of left upper lobe NSCLC, squamous cell carcinoma, and putative NSCLC of the right upper and lower lung - Clinical stage IA.  He has recovered well from the effects of his recent radiotherapy and remains without complaints aside from his chronic shortness of breath with exertion related to his COPD.  His recent post-treatment CT Chest scan is stable with excellent response to his recent treatment so we discussed the plan to resume serial CT chest scans every 6 months to continue to monitor for any evidence of disease progression or recurrence.  I will plan to call them following each scan, to discuss results and any recommendations.  They are comfortable with and in agreement with this plan and know to call at any time with any questions or concerns in the interim.  I will forward a copy of my note today to Dr. Louanne Skye, Dr. Judeth Horn and Dr. Graciela Husbands to keep them informed as well.    Given current concerns for patient exposure during the COVID-19 pandemic, this encounter was conducted via telephone. The patient/wife has given verbal consent for this type of encounter. The attendants for this meeting include Travis Say PA-C, patient, Travis Cruz and his wife, Travis Cruz. During the encounter, Laren Orama PA-C, was located at [facility] Radiation Oncology Department.  Patient, Bishara Rideaux and his wife, Travis Cruz, were located at home.  I personally spent 20 minutes in this encounter including chart review, reviewing radiological studies, in telephone conversation with the patient/wife, entering orders, coordinating care and completing documentation.   Marguarite Arbour, PA-C    Margaretmary Dys, MD  [facility]  Radiation Oncology Direct Dial: [phone]  Fax: [phone] [email or web address]  Skype  [email or web address]

## 2022-12-03 NOTE — Telephone Encounter (Signed)
[name] informed and understood. No concerns.

## 2022-12-03 NOTE — Telephone Encounter (Signed)
Description   Continue weekly dose to take half a tablet of the 3 mg tablets every Monday,, Tuesday, Wednesday  and Friday and continue 3 mg tablets the rest of the week.  INR  2.1 at goal (goal is 2-3) Recheck in 1-2 weeks, checks at home       Caryl Pina, MD [place] 12/03/2022, 9:35 AM

## 2022-12-04 ENCOUNTER — Ambulatory Visit
Admission: RE | Admit: 2022-12-04 | Discharge: 2022-12-04 | Disposition: A | Discharge status: Home / Self Care | Payer: Medicare HMO | Source: Ambulatory Visit | Attending: Urology | Admitting: Urology

## 2022-12-04 ENCOUNTER — Encounter: Payer: Self-pay | Admitting: Urology

## 2022-12-04 DIAGNOSIS — C3411 Malignant neoplasm of upper lobe, right bronchus or lung: Secondary | ICD-10-CM | POA: Diagnosis not present

## 2022-12-04 DIAGNOSIS — C3412 Malignant neoplasm of upper lobe, left bronchus or lung: Secondary | ICD-10-CM

## 2022-12-04 DIAGNOSIS — C3431 Malignant neoplasm of lower lobe, right bronchus or lung: Secondary | ICD-10-CM

## 2022-12-04 DIAGNOSIS — J449 Chronic obstructive pulmonary disease, unspecified: Secondary | ICD-10-CM | POA: Diagnosis not present

## 2022-12-04 NOTE — Progress Notes (Signed)
Telephone nursing appointment for most recent CT results per Ashlyn Bruning PA-C. I verified patient's identity and began nursing interview. Patient reports doing well. No complaints.   Meaningful use complete.   Patient aware of their 11:00am-12/04/2022 telephone appointment w/ Ashlyn Bruning PA-C. I left my extension [phone] in case patient needs anything. Patient verbalized understanding. This concludes the nursing interview.   Patient contact [phone]     Leandra Kern, LPN

## 2022-12-10 ENCOUNTER — Telehealth: Payer: Self-pay | Admitting: *Deleted

## 2022-12-10 DIAGNOSIS — Z86711 Personal history of pulmonary embolism: Secondary | ICD-10-CM

## 2022-12-10 DIAGNOSIS — Z86718 Personal history of other venous thrombosis and embolism: Secondary | ICD-10-CM | POA: Diagnosis not present

## 2022-12-10 DIAGNOSIS — Z7901 Long term (current) use of anticoagulants: Secondary | ICD-10-CM | POA: Diagnosis not present

## 2022-12-10 NOTE — Telephone Encounter (Signed)
Fax received [facility] PT/INR self testing service Test date/time 12/10/22 549 am INR 2.4

## 2022-12-10 NOTE — Telephone Encounter (Signed)
Description   Continue weekly dose to take half a tablet of the 3 mg tablets every Monday,, Tuesday, Wednesday  and Friday and continue 3 mg tablets the rest of the week.  INR  2.4 at goal (goal is 2-3) Recheck in 1-2 weeks, checks at home       Caryl Pina, MD [place] 12/10/2022, 11:16 AM

## 2022-12-10 NOTE — Telephone Encounter (Signed)
[name] informed. She has no concerns.

## 2022-12-12 DIAGNOSIS — G4733 Obstructive sleep apnea (adult) (pediatric): Secondary | ICD-10-CM | POA: Diagnosis not present

## 2022-12-16 ENCOUNTER — Inpatient Hospital Stay (HOSPITAL_COMMUNITY)
Admission: EM | Admit: 2022-12-16 | Discharge: 2022-12-22 | DRG: 177 | Disposition: A | Discharge status: Skilled Nursing Facility | Payer: Medicare HMO | Attending: Internal Medicine | Admitting: Internal Medicine

## 2022-12-16 ENCOUNTER — Emergency Department (HOSPITAL_COMMUNITY): Payer: Medicare HMO

## 2022-12-16 ENCOUNTER — Other Ambulatory Visit: Payer: Self-pay

## 2022-12-16 ENCOUNTER — Encounter (HOSPITAL_COMMUNITY): Payer: Self-pay | Admitting: Emergency Medicine

## 2022-12-16 DIAGNOSIS — Z79899 Other long term (current) drug therapy: Secondary | ICD-10-CM

## 2022-12-16 DIAGNOSIS — I252 Old myocardial infarction: Secondary | ICD-10-CM

## 2022-12-16 DIAGNOSIS — J449 Chronic obstructive pulmonary disease, unspecified: Secondary | ICD-10-CM | POA: Diagnosis present

## 2022-12-16 DIAGNOSIS — J440 Chronic obstructive pulmonary disease with acute lower respiratory infection: Secondary | ICD-10-CM | POA: Diagnosis present

## 2022-12-16 DIAGNOSIS — J439 Emphysema, unspecified: Secondary | ICD-10-CM | POA: Diagnosis present

## 2022-12-16 DIAGNOSIS — A419 Sepsis, unspecified organism: Secondary | ICD-10-CM | POA: Diagnosis not present

## 2022-12-16 DIAGNOSIS — F32A Depression, unspecified: Secondary | ICD-10-CM | POA: Diagnosis present

## 2022-12-16 DIAGNOSIS — F03918 Unspecified dementia, unspecified severity, with other behavioral disturbance: Secondary | ICD-10-CM | POA: Diagnosis present

## 2022-12-16 DIAGNOSIS — F0390 Unspecified dementia without behavioral disturbance: Secondary | ICD-10-CM

## 2022-12-16 DIAGNOSIS — E785 Hyperlipidemia, unspecified: Secondary | ICD-10-CM | POA: Diagnosis present

## 2022-12-16 DIAGNOSIS — Z86711 Personal history of pulmonary embolism: Secondary | ICD-10-CM

## 2022-12-16 DIAGNOSIS — Z8673 Personal history of transient ischemic attack (TIA), and cerebral infarction without residual deficits: Secondary | ICD-10-CM

## 2022-12-16 DIAGNOSIS — Z9841 Cataract extraction status, right eye: Secondary | ICD-10-CM

## 2022-12-16 DIAGNOSIS — I10 Essential (primary) hypertension: Secondary | ICD-10-CM | POA: Diagnosis present

## 2022-12-16 DIAGNOSIS — R000 Tachycardia, unspecified: Secondary | ICD-10-CM | POA: Diagnosis not present

## 2022-12-16 DIAGNOSIS — U071 COVID-19: Principal | ICD-10-CM | POA: Diagnosis present

## 2022-12-16 DIAGNOSIS — Z9842 Cataract extraction status, left eye: Secondary | ICD-10-CM

## 2022-12-16 DIAGNOSIS — J189 Pneumonia, unspecified organism: Secondary | ICD-10-CM | POA: Diagnosis present

## 2022-12-16 DIAGNOSIS — R1310 Dysphagia, unspecified: Secondary | ICD-10-CM | POA: Diagnosis present

## 2022-12-16 DIAGNOSIS — J9611 Chronic respiratory failure with hypoxia: Secondary | ICD-10-CM | POA: Diagnosis present

## 2022-12-16 DIAGNOSIS — R262 Difficulty in walking, not elsewhere classified: Secondary | ICD-10-CM

## 2022-12-16 DIAGNOSIS — I2510 Atherosclerotic heart disease of native coronary artery without angina pectoris: Secondary | ICD-10-CM | POA: Diagnosis present

## 2022-12-16 DIAGNOSIS — Z743 Need for continuous supervision: Secondary | ICD-10-CM | POA: Diagnosis not present

## 2022-12-16 DIAGNOSIS — R509 Fever, unspecified: Secondary | ICD-10-CM | POA: Diagnosis not present

## 2022-12-16 DIAGNOSIS — Z801 Family history of malignant neoplasm of trachea, bronchus and lung: Secondary | ICD-10-CM

## 2022-12-16 DIAGNOSIS — N400 Enlarged prostate without lower urinary tract symptoms: Secondary | ICD-10-CM | POA: Diagnosis present

## 2022-12-16 DIAGNOSIS — Z8674 Personal history of sudden cardiac arrest: Secondary | ICD-10-CM

## 2022-12-16 DIAGNOSIS — Z66 Do not resuscitate: Secondary | ICD-10-CM | POA: Diagnosis present

## 2022-12-16 DIAGNOSIS — M1712 Unilateral primary osteoarthritis, left knee: Secondary | ICD-10-CM | POA: Diagnosis present

## 2022-12-16 DIAGNOSIS — I5022 Chronic systolic (congestive) heart failure: Secondary | ICD-10-CM | POA: Diagnosis present

## 2022-12-16 DIAGNOSIS — Z923 Personal history of irradiation: Secondary | ICD-10-CM

## 2022-12-16 DIAGNOSIS — Z9981 Dependence on supplemental oxygen: Secondary | ICD-10-CM

## 2022-12-16 DIAGNOSIS — Z85118 Personal history of other malignant neoplasm of bronchus and lung: Secondary | ICD-10-CM

## 2022-12-16 DIAGNOSIS — Z7901 Long term (current) use of anticoagulants: Secondary | ICD-10-CM

## 2022-12-16 DIAGNOSIS — Z95810 Presence of automatic (implantable) cardiac defibrillator: Secondary | ICD-10-CM

## 2022-12-16 DIAGNOSIS — R531 Weakness: Secondary | ICD-10-CM | POA: Diagnosis not present

## 2022-12-16 DIAGNOSIS — J1282 Pneumonia due to coronavirus disease 2019: Secondary | ICD-10-CM | POA: Diagnosis present

## 2022-12-16 DIAGNOSIS — Z8249 Family history of ischemic heart disease and other diseases of the circulatory system: Secondary | ICD-10-CM

## 2022-12-16 DIAGNOSIS — F03C18 Unspecified dementia, severe, with other behavioral disturbance: Secondary | ICD-10-CM | POA: Diagnosis present

## 2022-12-16 DIAGNOSIS — R4182 Altered mental status, unspecified: Secondary | ICD-10-CM | POA: Diagnosis not present

## 2022-12-16 DIAGNOSIS — I110 Hypertensive heart disease with heart failure: Secondary | ICD-10-CM | POA: Diagnosis present

## 2022-12-16 DIAGNOSIS — Z86718 Personal history of other venous thrombosis and embolism: Secondary | ICD-10-CM

## 2022-12-16 DIAGNOSIS — G4733 Obstructive sleep apnea (adult) (pediatric): Secondary | ICD-10-CM | POA: Diagnosis present

## 2022-12-16 DIAGNOSIS — Z87891 Personal history of nicotine dependence: Secondary | ICD-10-CM

## 2022-12-16 DIAGNOSIS — Z888 Allergy status to other drugs, medicaments and biological substances status: Secondary | ICD-10-CM

## 2022-12-16 DIAGNOSIS — I255 Ischemic cardiomyopathy: Secondary | ICD-10-CM | POA: Diagnosis present

## 2022-12-16 DIAGNOSIS — R69 Illness, unspecified: Secondary | ICD-10-CM | POA: Diagnosis not present

## 2022-12-16 DIAGNOSIS — Z833 Family history of diabetes mellitus: Secondary | ICD-10-CM

## 2022-12-16 LAB — RESP PANEL BY RT-PCR (RSV, FLU A&B, COVID)  RVPGX2
Influenza A by PCR: NEGATIVE
Influenza B by PCR: NEGATIVE
Resp Syncytial Virus by PCR: NEGATIVE
SARS Coronavirus 2 by RT PCR: POSITIVE — AB

## 2022-12-16 LAB — CBC WITH DIFFERENTIAL/PLATELET
Abs Immature Granulocytes: 0.11 10*3/uL — ABNORMAL HIGH (ref 0.00–0.07)
Basophils Absolute: 0.1 10*3/uL (ref 0.0–0.1)
Basophils Relative: 1 %
Eosinophils Absolute: 0 10*3/uL (ref 0.0–0.5)
Eosinophils Relative: 0 %
HCT: 53.4 % — ABNORMAL HIGH (ref 39.0–52.0)
Hemoglobin: 17.2 g/dL — ABNORMAL HIGH (ref 13.0–17.0)
Immature Granulocytes: 1 %
Lymphocytes Relative: 18 %
Lymphs Abs: 2 10*3/uL (ref 0.7–4.0)
MCH: 28.7 pg (ref 26.0–34.0)
MCHC: 32.2 g/dL (ref 30.0–36.0)
MCV: 89.1 fL (ref 80.0–100.0)
Monocytes Absolute: 1.9 10*3/uL — ABNORMAL HIGH (ref 0.1–1.0)
Monocytes Relative: 17 %
Neutro Abs: 6.8 10*3/uL (ref 1.7–7.7)
Neutrophils Relative %: 63 %
Platelets: 322 10*3/uL (ref 150–400)
RBC: 5.99 MIL/uL — ABNORMAL HIGH (ref 4.22–5.81)
RDW: 18.3 % — ABNORMAL HIGH (ref 11.5–15.5)
WBC: 10.9 10*3/uL — ABNORMAL HIGH (ref 4.0–10.5)
nRBC: 0 % (ref 0.0–0.2)

## 2022-12-16 LAB — LACTIC ACID, PLASMA
Lactic Acid, Venous: 0.9 mmol/L (ref 0.5–1.9)
Lactic Acid, Venous: 0.7 mmol/L (ref 0.5–1.9)

## 2022-12-16 LAB — COMPREHENSIVE METABOLIC PANEL
ALT: 20 U/L (ref 0–44)
AST: 23 U/L (ref 15–41)
Albumin: 3.5 g/dL (ref 3.5–5.0)
Alkaline Phosphatase: 126 U/L (ref 38–126)
Anion gap: 10 (ref 5–15)
BUN: 18 mg/dL (ref 8–23)
CO2: 27 mmol/L (ref 22–32)
Calcium: 9.3 mg/dL (ref 8.9–10.3)
Chloride: 103 mmol/L (ref 98–111)
Creatinine, Ser: 0.83 mg/dL (ref 0.61–1.24)
GFR, Estimated: >60 mL/min (ref >60)
Glucose, Bld: 93 mg/dL (ref 70–99)
Potassium: 4 mmol/L (ref 3.5–5.1)
Sodium: 140 mmol/L (ref 135–145)
Total Bilirubin: 0.6 mg/dL (ref 0.3–1.2)
Total Protein: 7.3 g/dL (ref 6.5–8.1)

## 2022-12-16 LAB — APTT: aPTT: 32 seconds (ref 24–36)

## 2022-12-16 LAB — URINALYSIS, ROUTINE W REFLEX MICROSCOPIC
Bilirubin Urine: NEGATIVE
Glucose, UA: NEGATIVE mg/dL
Hgb urine dipstick: NEGATIVE
Ketones, ur: 5 mg/dL — AB
Leukocytes,Ua: NEGATIVE
Nitrite: NEGATIVE
Protein, ur: NEGATIVE mg/dL
Specific Gravity, Urine: 1.014 (ref 1.005–1.030)
pH: 6 (ref 5.0–8.0)

## 2022-12-16 LAB — PROTIME-INR
INR: 2 — ABNORMAL HIGH (ref 0.8–1.2)
Prothrombin Time: 22.2 seconds — ABNORMAL HIGH (ref 11.4–15.2)

## 2022-12-16 LAB — PROCALCITONIN: Procalcitonin: 0.15 ng/mL

## 2022-12-16 LAB — BLOOD GAS, VENOUS
Acid-Base Excess: 8.8 mmol/L — ABNORMAL HIGH (ref 0.0–2.0)
Bicarbonate: 35.7 mmol/L — ABNORMAL HIGH (ref 20.0–28.0)
Drawn by: 65579
O2 Saturation: 61.9 %
Patient temperature: 37.3
pCO2, Ven: 56 mmHg (ref 44–60)
pH, Ven: 7.42 (ref 7.25–7.43)
pO2, Ven: 35 mmHg (ref 32–45)

## 2022-12-16 LAB — STREP PNEUMONIAE URINARY ANTIGEN: Strep Pneumo Urinary Antigen: NEGATIVE

## 2022-12-16 LAB — LACTATE DEHYDROGENASE: LDH: 117 U/L (ref 98–192)

## 2022-12-16 LAB — C-REACTIVE PROTEIN: CRP: <0.5 mg/dL (ref <1.0)

## 2022-12-16 LAB — D-DIMER, QUANTITATIVE: D-Dimer, Quant: 0.43 ug/mL-FEU (ref 0.00–0.50)

## 2022-12-16 LAB — FERRITIN: Ferritin: 44 ng/mL (ref 24–336)

## 2022-12-16 MED ORDER — WARFARIN SODIUM 1 MG PO TABS
1.5000 mg | ORAL_TABLET | Freq: Once | ORAL | Status: AC
  Administered 2022-12-16: 1.5 mg via ORAL
  Filled 2022-12-16: qty 1

## 2022-12-16 MED ORDER — TAMSULOSIN HCL 0.4 MG PO CAPS
0.4000 mg | ORAL_CAPSULE | Freq: Every day | ORAL | Status: DC
  Administered 2022-12-16 – 2022-12-21 (×6): 0.4 mg via ORAL
  Filled 2022-12-16 (×7): qty 1

## 2022-12-16 MED ORDER — FINASTERIDE 5 MG PO TABS
5.0000 mg | ORAL_TABLET | Freq: Every day | ORAL | Status: DC
  Administered 2022-12-17 – 2022-12-22 (×6): 5 mg via ORAL
  Filled 2022-12-16 (×6): qty 1

## 2022-12-16 MED ORDER — ATORVASTATIN CALCIUM 40 MG PO TABS: 40.0000 mg | ORAL_TABLET | ORAL | Status: DC

## 2022-12-16 MED ORDER — SODIUM CHLORIDE 0.9 % IV SOLN
500.0000 mg | Freq: Once | INTRAVENOUS | Status: AC
  Administered 2022-12-16: 500 mg via INTRAVENOUS
  Filled 2022-12-16: qty 5

## 2022-12-16 MED ORDER — ALBUTEROL SULFATE (2.5 MG/3ML) 0.083% IN NEBU: 2.5000 mg | INHALATION_SOLUTION | RESPIRATORY_TRACT | Status: DC | PRN

## 2022-12-16 MED ORDER — ONDANSETRON HCL 4 MG PO TABS: 4.0000 mg | ORAL_TABLET | Freq: Four times a day (QID) | ORAL | Status: DC | PRN

## 2022-12-16 MED ORDER — ONDANSETRON HCL 4 MG/2ML IJ SOLN: 4.0000 mg | Freq: Four times a day (QID) | INTRAMUSCULAR | Status: DC | PRN

## 2022-12-16 MED ORDER — POLYETHYLENE GLYCOL 3350 17 G PO PACK: 17.0000 g | PACK | Freq: Every day | ORAL | Status: DC | PRN

## 2022-12-16 MED ORDER — SERTRALINE HCL 50 MG PO TABS
100.0000 mg | ORAL_TABLET | Freq: Every day | ORAL | Status: DC
  Administered 2022-12-17 – 2022-12-22 (×6): 100 mg via ORAL
  Filled 2022-12-16 (×6): qty 2

## 2022-12-16 MED ORDER — GUAIFENESIN-DM 100-10 MG/5ML PO SYRP
10.0000 mL | ORAL_SOLUTION | ORAL | Status: DC | PRN
  Administered 2022-12-16 – 2022-12-20 (×3): 10 mL via ORAL
  Filled 2022-12-16 (×3): qty 10

## 2022-12-16 MED ORDER — ACETAMINOPHEN 325 MG PO TABS
650.0000 mg | ORAL_TABLET | Freq: Four times a day (QID) | ORAL | Status: DC | PRN
  Administered 2022-12-16 – 2022-12-21 (×3): 650 mg via ORAL
  Filled 2022-12-16 (×3): qty 2

## 2022-12-16 MED ORDER — ALBUTEROL SULFATE HFA 108 (90 BASE) MCG/ACT IN AERS
2.0000 | INHALATION_SPRAY | Freq: Four times a day (QID) | RESPIRATORY_TRACT | Status: DC
  Administered 2022-12-17: 2 via RESPIRATORY_TRACT
  Filled 2022-12-16: qty 6.7

## 2022-12-16 MED ORDER — METOPROLOL TARTRATE 25 MG PO TABS
37.5000 mg | ORAL_TABLET | Freq: Two times a day (BID) | ORAL | Status: DC
  Administered 2022-12-16 – 2022-12-22 (×12): 37.5 mg via ORAL
  Filled 2022-12-16 (×12): qty 2

## 2022-12-16 MED ORDER — SODIUM CHLORIDE 0.9 % IV SOLN
1.0000 g | Freq: Once | INTRAVENOUS | Status: AC
  Administered 2022-12-16: 1 g via INTRAVENOUS
  Filled 2022-12-16: qty 10

## 2022-12-16 MED ORDER — WARFARIN - PHARMACIST DOSING INPATIENT: Freq: Every day | Status: DC

## 2022-12-16 MED ORDER — ACETAMINOPHEN 325 MG PO TABS
650.0000 mg | ORAL_TABLET | Freq: Once | ORAL | Status: AC
  Administered 2022-12-16: 650 mg via ORAL
  Filled 2022-12-16: qty 2

## 2022-12-16 MED ORDER — ALBUTEROL SULFATE HFA 108 (90 BASE) MCG/ACT IN AERS: 2.0000 | INHALATION_SPRAY | RESPIRATORY_TRACT | Status: DC | PRN

## 2022-12-16 MED ORDER — NIRMATRELVIR/RITONAVIR (PAXLOVID)TABLET
3.0000 | ORAL_TABLET | Freq: Two times a day (BID) | ORAL | Status: AC
  Administered 2022-12-16 – 2022-12-21 (×10): 3 via ORAL
  Filled 2022-12-16: qty 30

## 2022-12-16 MED ORDER — HYDROCOD POLI-CHLORPHE POLI ER 10-8 MG/5ML PO SUER
5.0000 mL | Freq: Two times a day (BID) | ORAL | Status: DC | PRN
  Administered 2022-12-21: 5 mL via ORAL
  Filled 2022-12-16: qty 5

## 2022-12-16 MED ORDER — SODIUM CHLORIDE 0.9 % IV BOLUS
1000.0000 mL | Freq: Once | INTRAVENOUS | Status: AC
  Administered 2022-12-16: 1000 mL via INTRAVENOUS

## 2022-12-16 MED ORDER — SODIUM CHLORIDE 0.9% FLUSH
3.0000 mL | Freq: Two times a day (BID) | INTRAVENOUS | Status: DC
  Administered 2022-12-17 – 2022-12-22 (×11): 3 mL via INTRAVENOUS
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
## 2022-12-16 NOTE — ED Triage Notes (Addendum)
Pt brought in by [facility] for c/o weakness and fever; pt has dementia; pt is able to get up with assistance with walker and pt was unable to walk  Temp with ems 100.1

## 2022-12-16 NOTE — Plan of Care (Signed)
  Problem: Respiratory: Goal: Complications related to the disease process, condition or treatment will be avoided or minimized Outcome: Progressing   

## 2022-12-17 DIAGNOSIS — J449 Chronic obstructive pulmonary disease, unspecified: Secondary | ICD-10-CM

## 2022-12-17 DIAGNOSIS — U071 COVID-19: Secondary | ICD-10-CM | POA: Diagnosis not present

## 2022-12-17 DIAGNOSIS — J9611 Chronic respiratory failure with hypoxia: Secondary | ICD-10-CM

## 2022-12-17 DIAGNOSIS — J189 Pneumonia, unspecified organism: Secondary | ICD-10-CM

## 2022-12-17 LAB — CBC WITH DIFFERENTIAL/PLATELET
Abs Immature Granulocytes: 0.07 10*3/uL (ref 0.00–0.07)
Basophils Absolute: 0.1 10*3/uL (ref 0.0–0.1)
Basophils Relative: 1 %
Eosinophils Absolute: 0 10*3/uL (ref 0.0–0.5)
Eosinophils Relative: 0 %
HCT: 47.5 % (ref 39.0–52.0)
Hemoglobin: 15.2 g/dL (ref 13.0–17.0)
Immature Granulocytes: 1 %
Lymphocytes Relative: 22 %
Lymphs Abs: 2.3 10*3/uL (ref 0.7–4.0)
MCH: 28.8 pg (ref 26.0–34.0)
MCHC: 32 g/dL (ref 30.0–36.0)
MCV: 90.1 fL (ref 80.0–100.0)
Monocytes Absolute: 1.8 10*3/uL — ABNORMAL HIGH (ref 0.1–1.0)
Monocytes Relative: 18 %
Neutro Abs: 6.2 10*3/uL (ref 1.7–7.7)
Neutrophils Relative %: 58 %
Platelets: 281 10*3/uL (ref 150–400)
RBC: 5.27 MIL/uL (ref 4.22–5.81)
RDW: 17.2 % — ABNORMAL HIGH (ref 11.5–15.5)
WBC: 10.4 10*3/uL (ref 4.0–10.5)
nRBC: 0 % (ref 0.0–0.2)

## 2022-12-17 LAB — COMPREHENSIVE METABOLIC PANEL
ALT: 16 U/L (ref 0–44)
AST: 23 U/L (ref 15–41)
Albumin: 2.9 g/dL — ABNORMAL LOW (ref 3.5–5.0)
Alkaline Phosphatase: 108 U/L (ref 38–126)
Anion gap: 7 (ref 5–15)
BUN: 15 mg/dL (ref 8–23)
CO2: 25 mmol/L (ref 22–32)
Calcium: 8.8 mg/dL — ABNORMAL LOW (ref 8.9–10.3)
Chloride: 108 mmol/L (ref 98–111)
Creatinine, Ser: 0.63 mg/dL (ref 0.61–1.24)
GFR, Estimated: >60 mL/min (ref >60)
Glucose, Bld: 83 mg/dL (ref 70–99)
Potassium: 3.9 mmol/L (ref 3.5–5.1)
Sodium: 140 mmol/L (ref 135–145)
Total Bilirubin: 0.5 mg/dL (ref 0.3–1.2)
Total Protein: 6 g/dL — ABNORMAL LOW (ref 6.5–8.1)

## 2022-12-17 LAB — LEGIONELLA PNEUMOPHILA SEROGP 1 UR AG: L. pneumophila Serogp 1 Ur Ag: NEGATIVE

## 2022-12-17 LAB — MAGNESIUM: Magnesium: 1.9 mg/dL (ref 1.7–2.4)

## 2022-12-17 LAB — PROTIME-INR
INR: 2.3 — ABNORMAL HIGH (ref 0.8–1.2)
Prothrombin Time: 25.4 seconds — ABNORMAL HIGH (ref 11.4–15.2)

## 2022-12-17 MED ORDER — WARFARIN SODIUM 2 MG PO TABS
2.0000 mg | ORAL_TABLET | Freq: Once | ORAL | Status: AC
  Administered 2022-12-17: 2 mg via ORAL
  Filled 2022-12-17: qty 1

## 2022-12-17 MED ORDER — ALBUTEROL SULFATE HFA 108 (90 BASE) MCG/ACT IN AERS: 2.0000 | INHALATION_SPRAY | RESPIRATORY_TRACT | Status: DC | PRN

## 2022-12-17 MED ORDER — ALBUTEROL SULFATE (2.5 MG/3ML) 0.083% IN NEBU
INHALATION_SOLUTION | RESPIRATORY_TRACT | Status: AC
  Administered 2022-12-17: 2.5 mg
  Filled 2022-12-17: qty 3

## 2022-12-17 MED ORDER — WARFARIN SODIUM 1 MG PO TABS: 1.5000 mg | ORAL_TABLET | Freq: Once | ORAL | Status: DC

## 2022-12-17 MED ORDER — ALBUTEROL SULFATE (2.5 MG/3ML) 0.083% IN NEBU
2.5000 mg | INHALATION_SOLUTION | Freq: Four times a day (QID) | RESPIRATORY_TRACT | Status: DC
  Administered 2022-12-17 – 2022-12-20 (×12): 2.5 mg via RESPIRATORY_TRACT
  Filled 2022-12-17 (×13): qty 3

## 2022-12-17 MED ORDER — ALBUTEROL SULFATE (2.5 MG/3ML) 0.083% IN NEBU: 2.5000 mg | INHALATION_SOLUTION | RESPIRATORY_TRACT | Status: DC | PRN

## 2022-12-17 NOTE — Plan of Care (Signed)
  Problem: Acute Rehab PT Goals(only PT should resolve) Goal: Pt Will Go Supine/Side To Sit Outcome: Progressing Flowsheets (Taken 12/17/2022 1358) Pt will go Supine/Side to Sit:  with moderate assist  with minimal assist Goal: Patient Will Transfer Sit To/From Stand Outcome: Progressing Flowsheets (Taken 12/17/2022 1358) Patient will transfer sit to/from stand: with moderate assist Goal: Pt Will Transfer Bed To Chair/Chair To Bed Outcome: Progressing Flowsheets (Taken 12/17/2022 1358) Pt will Transfer Bed to Chair/Chair to Bed: with mod assist Goal: Pt Will Ambulate Outcome: Progressing Flowsheets (Taken 12/17/2022 1358) Pt will Ambulate:  10 feet  with moderate assist  with rolling walker   1:59 PM, 12/17/22 [name], MPT Physical Therapist with [facility] [phone] office [phone] mobile phone
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
## 2022-12-17 NOTE — Progress Notes (Signed)
Pt unable to answer admission questions.

## 2022-12-17 NOTE — Plan of Care (Signed)
  Problem: Acute Rehab OT Goals (only OT should resolve) Goal: Pt. Will Perform Grooming Flowsheets (Taken 12/17/2022 0951) Pt Will Perform Grooming:  sitting  with supervision Goal: Pt. Will Perform Lower Body Bathing Flowsheets (Taken 12/17/2022 0951) Pt Will Perform Lower Body Bathing:  with mod assist  with min assist  sitting/lateral leans Goal: Pt. Will Perform Upper Body Dressing Flowsheets (Taken 12/17/2022 0951) Pt Will Perform Upper Body Dressing:  sitting  with min guard assist Goal: Pt. Will Perform Lower Body Dressing Flowsheets (Taken 12/17/2022 0951) Pt Will Perform Lower Body Dressing:  with mod assist  sitting/lateral leans Goal: Pt. Will Transfer To Toilet [place] (Taken 12/17/2022 [phone]) Pt Will Transfer to Toilet:  with min guard assist  with min assist  stand pivot transfer Goal: Pt/Caregiver Will Perform Home Exercise Program Flowsheets (Taken 12/17/2022 [phone]) Pt/caregiver will Perform Home Exercise Program:  Increased ROM  Increased strength  Both right and left upper extremity  With minimal assist  Elisheva Fallas OT, MOT

## 2022-12-17 NOTE — Evaluation (Signed)
Physical Therapy Evaluation Patient Details Name: Travis Cruz MRN: [id number] DOB: [birth year] Today's Date: 12/17/2022  History of Present Illness  Travis Cruz is a 76 y.o. male with medical history significant of dementia, COPD, chronic hypoxic respiratory failure, BiPAP dependent at night, CAD, hypertension, CHF, PE on Coumadin who was brought into the emergency department with a complaint of cough and fever.  Patient has severe dementia, he rarely talks, during my encounter, no family was present at bedside and he did not talk to me at all.  He appeared sick.  I called his wife and was able to gather the history and was able to verify the history provided by the ED physician in addition to the fact that patient typically only can walk a few steps from bed to chair and today he was unable to do so as well.  He had low-grade fever of 100.1 at home and he has been coughing since a few days.  The last time he had COVID-vaccine was about 2 years ago.  Wife does not recall anybody sick around him.  No recent travel.   Clinical Impression  Patient presents lethargic requiring verbal/tactile cueing to participate, once seated became more alert, but continued to required repeated verbal/tactile cueing.  Patient demonstrates slow labored movement for sitting up at bedside, very unsteady on feet and unable to advance BLE due to weakness, tolerated standing for approximately 1-2 minutes before having to sit due to weakness.  Patient put back to bed with Max assist to reposition.  Patient will benefit from continued skilled physical therapy in hospital and recommended venue below to increase strength, balance, endurance for safe ADLs and gait.         Recommendations for follow up therapy are one component of a multi-disciplinary discharge planning process, led by the attending physician.  Recommendations may be updated based on patient status, additional functional criteria and insurance  authorization.  Follow Up Recommendations Skilled nursing-short term rehab (<3 hours/day) Can patient physically be transported by private vehicle: No    Assistance Recommended at Discharge Intermittent Supervision/Assistance  Patient can return home with the following  A lot of help with bathing/dressing/bathroom;A lot of help with walking and/or transfers;Help with stairs or ramp for entrance;Assistance with cooking/housework    Equipment Recommendations None recommended by PT  Recommendations for Other Services       Functional Status Assessment Patient has had a recent decline in their functional status and demonstrates the ability to make significant improvements in function in a reasonable and predictable amount of time.     Precautions / Restrictions Precautions Precautions: Fall Restrictions Weight Bearing Restrictions: No      Mobility  Bed Mobility Overal bed mobility: Needs Assistance Bed Mobility: Supine to Sit, Sit to Supine     Supine to sit: Max assist, HOB elevated Sit to supine: Max assist   General bed mobility comments: slow labored movement with diffiuclty using BUE and moving BLE due to weakness    Transfers Overall transfer level: Needs assistance Equipment used: Rolling walker (2 wheels) Transfers: Sit to/from Stand Sit to Stand: Max assist           General transfer comment: unsteady labored movement, unable to advance BLE due to weakness    Ambulation/Gait                  Stairs            Wheelchair Mobility    Modified Rankin (Stroke Patients  Only)       Balance Overall balance assessment: Needs assistance Sitting-balance support: Feet supported, No upper extremity supported Sitting balance-Leahy Scale: Poor Sitting balance - Comments: fair/poor seated at EOB Postural control: Left lateral lean Standing balance support: Reliant on assistive device for balance, During functional activity, Bilateral upper  extremity supported Standing balance-Leahy Scale: Poor Standing balance comment: using RW                             Pertinent Vitals/Pain Pain Assessment Pain Assessment: Faces Faces Pain Scale: No hurt    Home Living Family/patient expects to be discharged to:: Private residence Living Arrangements: Spouse/significant other Available Help at Discharge: Family;Available 24 hours/day Type of Home: House Home Access: Ramped entrance       Home Layout: One level Home Equipment: Conservation officer, nature (2 wheels);Wheelchair - manual;BSC/3in1;Rollator (4 wheels) Additional Comments: Pt is a poor historian with very minimal verbal communication today. HPI and chart review indicates pt was living with his wife and has 24 hour caregive.    Prior Function Prior Level of Function : Needs assist  Cognitive Assist : Mobility (cognitive)     Physical Assist : Mobility (physical);ADLs (physical) Mobility (physical): Bed mobility;Transfers;Gait;Stairs   Mobility Comments: household ambulator using Rollator, on home O2 ADLs Comments: assisted by family     Hand Dominance        Extremity/Trunk Assessment   Upper Extremity Assessment Upper Extremity Assessment: Defer to OT evaluation    Lower Extremity Assessment Lower Extremity Assessment: Generalized weakness    Cervical / Trunk Assessment Cervical / Trunk Assessment: Kyphotic  Communication   Communication: Expressive difficulties;Receptive difficulties  Cognition Arousal/Alertness: Lethargic Behavior During Therapy: Flat affect Overall Cognitive Status: History of cognitive impairments - at baseline                                 General Comments: initially non responsive, but became more alert after sitting up at bedside        General Comments      Exercises     Assessment/Plan    PT Assessment Patient needs continued PT services  PT Problem List Decreased strength;Decreased activity  tolerance;Decreased balance;Decreased mobility       PT Treatment Interventions DME instruction;Gait training;Stair training;Functional mobility training;Therapeutic activities;Therapeutic exercise;Patient/family education;Balance training    PT Goals (Current goals can be found in the Care Plan section)  Acute Rehab PT Goals Patient Stated Goal: not stated PT Goal Formulation: With patient Time For Goal Achievement: 12/31/22 Potential to Achieve Goals: Good    Frequency Min 3X/week     Co-evaluation PT/OT/SLP Co-Evaluation/Treatment: Yes Reason for Co-Treatment: Complexity of the patient's impairments (multi-system involvement);To address functional/ADL transfers PT goals addressed during session: Mobility/safety with mobility;Balance;Proper use of DME         AM-PAC PT "6 Clicks" Mobility  Outcome Measure Help needed turning from your back to your side while in a flat bed without using bedrails?: A Lot Help needed moving from lying on your back to sitting on the side of a flat bed without using bedrails?: A Lot Help needed moving to and from a bed to a chair (including a wheelchair)?: Total Help needed standing up from a chair using your arms (e.g., wheelchair or bedside chair)?: A Lot Help needed to walk in hospital room?: Total Help needed climbing 3-5 steps with a railing? :  Total 6 Click Score: 9    End of Session Equipment Utilized During Treatment: Oxygen Activity Tolerance: Patient tolerated treatment well;Patient limited by fatigue Patient left: in bed;with call bell/phone within reach Nurse Communication: Mobility status PT Visit Diagnosis: Unsteadiness on feet (R26.81);Other abnormalities of gait and mobility (R26.89);Muscle weakness (generalized) (M62.81)    Time: 7127-8718 PT Time Calculation (min) (ACUTE ONLY): 21 min   Charges:   PT Evaluation $PT Eval Moderate Complexity: 1 Mod PT Treatments $Therapeutic Activity: 8-22 mins        1:57 PM,  12/17/22 [name], MPT Physical Therapist with [facility] 336 [phone] office [phone] mobile phone

## 2022-12-17 NOTE — Care Management Obs Status (Signed)
[place] NOTIFICATION   Patient Details  Name: Travis Cruz MRN: [id number] Date of Birth: [birth year]   Medicare Observation Status Notification Given:  Yes    Tommy Medal 12/17/2022, 4:18 PM

## 2022-12-17 NOTE — TOC Initial Note (Signed)
Transition of Care [facility]) - Initial/Assessment Note    Patient Details  Name: Travis Cruz MRN: [id number] Date of Birth: March 01, 1946  [facility] of [facility]) CM/SW Contact:    Salome Arnt, [place] Phone Number: 12/17/2022, 2:33 PM  Clinical Narrative: Pt admitted due to COVID-19. Assessment completed with pt's wife. Pt's wife reports she still works and she has a Actuary with pt when she works. At baseline, pt requires assist with all ADLs. He transfers with one person assist. He is on 3L home O2 ([employer]) and uses BiPAP at night. Pt has dementia and speaks minimally. PT evaluated pt and recommend SNF. Discussed with wife. She is hopeful that pt can return home, but would need for pt to be close to baseline. Per PT, pt was max assist for transfers today. Pt's wife is agreeable to SNF if needed and requests [facility]. Referral sent and [facility] accepts. Wife updated. Will need [employer] authorization- CMA to start this afternoon. [facility] can accept pt on Monday as he tested positive for COVID yesterday. PT will continue to work with pt and if pt improves, he will d/c home. Pt has had [facility] home health recently, but not currently active per wife. If pt d/c to [facility], wife will bring BiPAP to SNF. MD updated.                    Planned Disposition:  (unsure at this time) Barriers to Discharge: Continued Medical Work up   Patient Goals and CMS Choice Patient states their goals for this hospitalization and ongoing recovery are:: return home vs SNF   Choice offered to / list presented to : Spouse      Expected Discharge Plan and Services Planned Disposition:  (unsure at this time) In-house Referral: Clinical Social Work     Living arrangements for the past 2 months: Single Family Home                                      Prior Living Arrangements/Services Living arrangements for the past 2 months: Single Family Home Lives with:: Spouse Patient language and need for interpreter  reviewed:: Yes Do you feel safe going back to the place where you live?: Yes      Need for Family Participation in Patient Care: Yes (Comment) Care giver support system in place?: Yes (comment) Current home services: DME (walker, wheelchair, shower bench, O2, Bipap) Criminal Activity/Legal Involvement Pertinent to Current Situation/Hospitalization: No - Comment as needed  Activities of Daily Living      Permission Sought/Granted                  Emotional Assessment   Attitude/Demeanor/Rapport: Unable to Assess Affect (typically observed): Unable to Assess Orientation: : Oriented to Self Alcohol / Substance Use: Not Applicable Psych Involvement: No (comment)  Admission diagnosis:  Weakness [R53.1] Unable to ambulate [R26.2] Dementia, unspecified dementia severity, unspecified dementia type, unspecified whether behavioral, psychotic, or mood disturbance or anxiety ([place]) [F03.90] COVID-19 [U07.1] Patient Active Problem List   Diagnosis Date Noted   COVID-19 12/16/2022   Primary cancer of left upper lobe of lung ([place]) 06/23/2022   Multiple pulmonary nodules determined by computed tomography of lung 32/20/2542   Systolic and diastolic CHF, chronic ([place]) 12/03/2020   Cardiomyopathy ([place]) Iscemic and Non-Ischemic 11/20/2020   CAP (community acquired pneumonia) 02/27/2020   Chronic respiratory failure with hypoxia ([place]) 01/17/2020  Primary cancer of right upper lobe of lung ([place]) 10/04/2019   Dementia with behavioral disturbance 09/08/2019   Senile purpura ([place]) 11/02/2017   Cancer of bronchus of right lower lobe ([place]) 02/25/2017   History of DVT (deep vein thrombosis) 12/04/2016   History of pulmonary embolus (PE) 12/04/2016   History of CVA (cerebrovascular accident) 12/04/2016   Macular degeneration, dry 12/20/2015   COPD (chronic obstructive pulmonary disease) ([place])    Implantable cardioverter-defibrillator single chamber [employer] 10/05/2013   Hyperlipidemia 05/13/2013    CAD (coronary artery disease) 05/13/2013   History of ventricular fibrillation 03/28/2013   Generalized ischemic myocardial dysfunction 03/15/2013   History of non-ST elevation myocardial infarction (NSTEMI) 01/27/2013   Patent arterial duct 03/10/2012   Obstructive sleep apnea syndrome in adult 03/10/2012   Hypertension 03/24/2011   PCP:  Dettinger, Fransisca Kaufmann, MD Pharmacy:   [id number] [place], [address] [place] [address] Phone: [phone] Fax: [phone]     Social Determinants of Health (SDOH) Social History: SDOH Screenings   Food Insecurity: No Food Insecurity (04/16/2022)  Housing: Low Risk  (04/16/2022)  Transportation Needs: No Transportation Needs (04/16/2022)  Alcohol Screen: Low Risk  (04/16/2022)  Depression (PHQ2-9): Medium Risk (05/01/2022)  Financial Resource Strain: Low Risk  (04/16/2022)  Physical Activity: Inactive (04/16/2022)  Social Connections: Moderately Isolated (04/16/2022)  Stress: No Stress Concern Present (04/16/2022)  Tobacco Use: Medium Risk (12/16/2022)   SDOH Interventions:     Readmission Risk Interventions    12/13/2020   12:35 PM  Readmission Risk Prevention Plan  Transportation Screening Complete  Medication Review ([place]) Complete  PCP or Specialist appointment within 3-5 days of discharge Not Complete  PCP/Specialist Appt Not Complete comments Patient will see SNF medical director  [place] or [place] Complete  SW Recovery Care/Counseling Consult Complete  Palliative Care Screening Not [place] Complete

## 2022-12-17 NOTE — Evaluation (Signed)
Occupational Therapy Evaluation Patient Details Name: Travis Cruz MRN: [id number] DOB: 1946/04/23 Today's Date: 12/17/2022   History of Present Illness Odean Fester Finigan is a 76 y.o. male with medical history significant of dementia, COPD, chronic hypoxic respiratory failure, BiPAP dependent at night, CAD, hypertension, CHF, PE on Coumadin who was brought into the emergency department with a complaint of cough and fever.  Patient has severe dementia, he rarely talks, during my encounter, no family was present at bedside and he did not talk to me at all.  He appeared sick.  I called his wife and was able to gather the history and was able to verify the history provided by the ED physician in addition to the fact that patient typically only can walk a few steps from bed to chair and today he was unable to do so as well.  He had low-grade fever of 100.1 at home and he has been coughing since a few days.  The last time he had COVID-vaccine was about 2 years ago.  Wife does not recall anybody sick around him.  No recent travel. (Per MD)   Clinical Impression   Pt agreeable to OT and PT co-evaluation. Pt prior history was based on chart review with minimal information. Pt reportedly has 24/7 assist. Today pt required max A for bed mobility and sit to stand from EOB. Pt is very weak with minimal A/ROM of B UE. Pt baseline is unclear, but he seems to be very weak. Very minimal verbal communication today. SNF recommended based on pt needing much assist at this time. Unclear of families ability to provide significant support for transfers and ADL's. Pt will benefit from continued OT in the hospital and recommended venue below to increase strength, balance, and endurance for safe ADL's.        Recommendations for follow up therapy are one component of a multi-disciplinary discharge planning process, led by the attending physician.  Recommendations may be updated based on patient status, additional  functional criteria and insurance authorization.   Follow Up Recommendations  Skilled nursing-short term rehab (<3 hours/day)     Assistance Recommended at Discharge Frequent or constant Supervision/Assistance  Patient can return home with the following A lot of help with walking and/or transfers;A lot of help with bathing/dressing/bathroom;Assistance with cooking/housework;Assistance with feeding;Direct supervision/assist for medications management;Assist for transportation;Help with stairs or ramp for entrance    Functional Status Assessment  Patient has had a recent decline in their functional status and/or demonstrates limited ability to make significant improvements in function in a reasonable and predictable amount of time  Equipment Recommendations  None recommended by OT    Recommendations for Other Services       Precautions / Restrictions Precautions Precautions: Fall Restrictions Weight Bearing Restrictions: No      Mobility Bed Mobility Overal bed mobility: Needs Assistance Bed Mobility: Supine to Sit, Sit to Supine     Supine to sit: Max assist, HOB elevated Sit to supine: Max assist   General bed mobility comments: Much assist to come to sit and move B LE into bed. Poor torso control.    Transfers Overall transfer level: Needs assistance Equipment used: Rolling walker (2 wheels) Transfers: Sit to/from Stand Sit to Stand: Max assist           General transfer comment: Max A by PT with use of RW to stand at EOB.      Balance Overall balance assessment: Needs assistance Sitting-balance support: Bilateral upper extremity supported,  Feet supported Sitting balance-Leahy Scale: Poor Sitting balance - Comments: poor to fair seated EOB Postural control: Left lateral lean Standing balance support: Bilateral upper extremity supported, During functional activity, Reliant on assistive device for balance Standing balance-Leahy Scale: Poor Standing balance  comment: using RW                           ADL either performed or assessed with clinical judgement   ADL Overall ADL's : Needs assistance/impaired     Grooming: Moderate assistance;Sitting   Upper Body Bathing: Minimal assistance;Moderate assistance;Sitting   Lower Body Bathing: Total assistance   Upper Body Dressing : Moderate assistance;Sitting   Lower Body Dressing: Total assistance;Bed level Lower Body Dressing Details (indicate cue type and reason): Assisted to don socks. Toilet Transfer: Maximal assistance;Rolling walker (2 wheels) Toilet Transfer Details (indicate cue type and reason): Partially simulated via sit to stand and lateral steps to R side with RW Toileting- Clothing Manipulation and Hygiene: Total assistance;Bed level               Vision Patient Visual Report:  (Unsure. Pt unable to report.) Vision Assessment?:  (Difficult to assess at this time due to cognition. No obvious deficits.)                Pertinent Vitals/Pain Pain Assessment Pain Assessment: Faces Faces Pain Scale: No hurt     Hand Dominance  (unsure)   Extremity/Trunk Assessment Upper Extremity Assessment Upper Extremity Assessment: Generalized weakness;Difficult to assess due to impaired cognition [facility] P/ROM of flexion with some tightness near end range. Minimal active range of shoulders. Able to grasp RW for sit to stand.)   Lower Extremity Assessment Lower Extremity Assessment: Defer to PT evaluation   Cervical / Trunk Assessment Cervical / Trunk Assessment: Kyphotic   Communication Communication Communication: Expressive difficulties;Receptive difficulties   Cognition Arousal/Alertness: Lethargic Behavior During Therapy: Flat affect Overall Cognitive Status: History of cognitive impairments - at baseline                                 General Comments: Pt hardly verbally communicating. Difficulty following one step directions.                       Home Living Family/patient expects to be discharged to:: Other (Comment)                                 Additional Comments: Pt is a poor historian with very minimal verbal communication today. HPI and chart review indicates pt was living with his wife and has 24 hour caregive.      Prior Functioning/Environment Prior Level of Function : Needs assist       Physical Assist : Mobility (physical);ADLs (physical) Mobility (physical): Transfers   Mobility Comments: HPI indicates pt was able to take a few steps from bed to chair prior to hospitalization. ADLs Comments: Unsure of pt's ADL status. Likely assisted based on other history.        OT Problem List: Decreased strength;Decreased activity tolerance;Decreased range of motion;Impaired balance (sitting and/or standing);Decreased cognition      OT Treatment/Interventions: Self-care/ADL training;Therapeutic exercise;Therapeutic activities;Cognitive remediation/compensation;Patient/family education;Balance training    OT Goals(Current goals can be found in the care plan section) Acute Rehab OT Goals Patient Stated Goal: none stated;  no family present OT Goal Formulation: Patient unable to participate in goal setting Time For Goal Achievement: 12/31/22 Potential to Achieve Goals:  (none stated.)  OT Frequency: Min 2X/week    Co-evaluation PT/OT/SLP Co-Evaluation/Treatment: Yes Reason for Co-Treatment: Complexity of the patient's impairments (multi-system involvement)   OT goals addressed during session: ADL's and self-care      AM-PAC OT "6 Clicks" Daily Activity     Outcome Measure Help from another person eating meals?: A Little Help from another person taking care of personal grooming?: A Lot Help from another person toileting, which includes using toliet, bedpan, or urinal?: Total Help from another person bathing (including washing, rinsing, drying)?: A Lot Help from another person to put on  and taking off regular upper body clothing?: A Lot Help from another person to put on and taking off regular lower body clothing?: Total 6 Click Score: 11   End of Session Equipment Utilized During Treatment: Rolling walker (2 wheels);Oxygen  Activity Tolerance: Patient tolerated treatment well Patient left: in bed;with call bell/phone within reach;with bed alarm set  OT Visit Diagnosis: Unsteadiness on feet (R26.81);Other abnormalities of gait and mobility (R26.89);Muscle weakness (generalized) (M62.81)                Time: 1700-1749 OT Time Calculation (min): 17 min Charges:  OT General Charges $OT Visit: 1 Visit OT Evaluation $OT Eval Low Complexity: 1 Low  Jaylaa Gallion OT, MOT  Larey Seat 12/17/2022, 9:50 AM

## 2022-12-17 NOTE — Progress Notes (Signed)
PROGRESS NOTE    Travis Cruz  PJA:[id number] DOB: [birth year] DOA: 12/16/2022 PCP: Dettinger, Fransisca Kaufmann, MD    Brief Narrative:  76 year old male with history of dementia, COPD on home oxygen, chronic respiratory failure on nightly BiPAP, admitted to the hospital with COVID-19 pneumonia and increasing weakness.  He is being treated with Paxlovid and otherwise supportive treatment.  Seen by physical therapy with recommendations for skilled nursing facility.   Assessment & Plan:   Principal Problem:   COVID-19 Active Problems:   Hyperlipidemia   CAD (coronary artery disease)   COPD (chronic obstructive pulmonary disease) (HCC)   History of pulmonary embolus (PE)   Hypertension   Dementia with behavioral disturbance   Chronic respiratory failure with hypoxia (HCC)   CAP (community acquired pneumonia)   COVID-19 pneumonia -Started on Paxlovid -Inflammatory markers are currently not elevated -Continue supportive management with bronchodilators -He has been started on IV antibiotics for any superimposed bacterial component -Procalcitonin 0.15  COPD with chronic hypoxic respiratory failure -No wheezing appreciated at this time -Currently on home oxygen requirement of 3 L -Wife reports that he wears BiPAP at night, will resume  History of PE -Continue on Coumadin -INR therapeutic  Hyperlipidemia -Continue statin  BPH -Continue tamsulosin  Hypertension -Continued on metoprolol -Blood pressure stable  Dementia -His wife reports that he is becoming less verbal -At baseline, he is able to transfer from bed to chair and has been ambulating a little bit with a walker -He has had overall worsening weakness -Seen by PT with recommendations for skilled nursing facility -Suspect that overall mental status likely worsened in the setting of COVID as well.   DVT prophylaxis: Warfarin  Code Status: DNR Family Communication: Updated wife over the phone Disposition Plan:  Status is: Observation The patient remains OBS appropriate and will d/c before 2 midnights.     Consultants:    Procedures:    Antimicrobials:      Subjective: Does not really respond to questions.  He does open his eyes, but subsequently closes them.  Wife reports that he has been doing this more lately even when he is eating.  Objective: Vitals:   12/17/22 1024 12/17/22 1324 12/17/22 1605 12/17/22 1950  BP: 116/66 115/65    Pulse: 97 96    Resp:  19    Temp: 98.8 F (37.1 C) 98.5 F (36.9 C)    TempSrc: Axillary Axillary    SpO2: 92% 91% 92% 90%  Weight:      Height:        Intake/Output Summary (Last 24 hours) at 12/17/2022 2043 Last data filed at 12/17/2022 1824 Gross per 24 hour  Intake 340 ml  Output 800 ml  Net -460 ml   Filed Weights   12/16/22 1600  Weight: 103.4 kg    Examination:  General exam: Somnolent, although he does open his eyes to voice Respiratory system: Clear to auscultation. Respiratory effort normal. Cardiovascular system: S1 & S2 heard, RRR. No JVD, murmurs, rubs, gallops or clicks. No pedal edema. Gastrointestinal system: Abdomen is nondistended, soft and nontender. No organomegaly or masses felt. Normal bowel sounds heard. Central nervous system:  No focal neurological deficits. Extremities: Symmetric 5 x 5 power. Skin: No rashes, lesions or ulcers Psychiatry: Nonverbal    Data Reviewed: I have personally reviewed following labs and imaging studies  CBC: Recent Labs  Lab 12/16/22 0944 12/17/22 0404  WBC 10.9* 10.4  NEUTROABS 6.8 6.2  HGB 17.2* 15.2  HCT 53.4* 47.5  MCV 89.1 90.1  PLT 322 834   Basic Metabolic Panel: Recent Labs  Lab 12/16/22 0944 12/17/22 0404  NA 140 140  K 4.0 3.9  CL 103 108  CO2 27 25  GLUCOSE 93 83  BUN 18 15  CREATININE 0.83 0.63  CALCIUM 9.3 8.8*  MG  --  1.9   GFR: Estimated Creatinine Clearance: 96 mL/min (by C-G formula based on SCr of 0.63 mg/dL). Liver Function  Tests: Recent Labs  Lab 12/16/22 0944 12/17/22 0404  AST 23 23  ALT 20 16  ALKPHOS 126 108  BILITOT 0.6 0.5  PROT 7.3 6.0*  ALBUMIN 3.5 2.9*   No results for input(s): "LIPASE", "AMYLASE" in the last 168 hours. No results for input(s): "AMMONIA" in the last 168 hours. Coagulation Profile: Recent Labs  Lab 12/16/22 0944 12/17/22 0404  INR 2.0* 2.3*   Cardiac Enzymes: No results for input(s): "CKTOTAL", "CKMB", "CKMBINDEX", "TROPONINI" in the last 168 hours. BNP (last 3 results) No results for input(s): "PROBNP" in the last 8760 hours. HbA1C: No results for input(s): "HGBA1C" in the last 72 hours. CBG: No results for input(s): "GLUCAP" in the last 168 hours. Lipid Profile: No results for input(s): "CHOL", "HDL", "LDLCALC", "TRIG", "CHOLHDL", "LDLDIRECT" in the last 72 hours. Thyroid Function Tests: No results for input(s): "TSH", "T4TOTAL", "FREET4", "T3FREE", "THYROIDAB" in the last 72 hours. Anemia Panel: Recent Labs    12/16/22 1539  FERRITIN 44   Sepsis Labs: Recent Labs  Lab 12/16/22 0944 12/16/22 1126 12/16/22 1539  PROCALCITON  --   --  0.15  LATICACIDVEN 0.9 0.7  --     Recent Results (from the past 240 hour(s))  Blood Culture (routine x 2)     Status: None (Preliminary result)   Collection Time: 12/16/22  9:44 AM   Specimen: BLOOD  Result Value Ref Range Status   Specimen Description BLOOD BLOOD RIGHT ARM  Final   Special Requests   Final    BOTTLES DRAWN AEROBIC AND ANAEROBIC Blood Culture adequate volume LEFT ANTECUBITAL   Culture   Final    NO GROWTH 1 DAY Performed at [facility], [address], [place], [place] [address]    Report Status PENDING  Incomplete  Blood Culture (routine x 2)     Status: None (Preliminary result)   Collection Time: 12/16/22  9:44 AM   Specimen: BLOOD  Result Value Ref Range Status   Specimen Description BLOOD BLOOD LEFT HAND  Final   Special Requests   Final    BOTTLES DRAWN AEROBIC AND ANAEROBIC Blood Culture  adequate volume LEFT ANTECUBITAL   Culture   Final    NO GROWTH 1 DAY Performed at [facility], [address], [place], [place] [address]    Report Status PENDING  Incomplete  Resp panel by RT-PCR (RSV, Flu A&B, Covid) Anterior Nasal Swab     Status: Abnormal   Collection Time: 12/16/22 12:00 PM   Specimen: Anterior Nasal Swab  Result Value Ref Range Status   SARS Coronavirus 2 by RT PCR POSITIVE (A) NEGATIVE Final    Comment: (NOTE) SARS-CoV-2 target nucleic acids are DETECTED.  The SARS-CoV-2 RNA is generally detectable in upper respiratory specimens during the acute phase of infection. Positive results are indicative of the presence of the identified virus, but do not rule out bacterial infection or co-infection with other pathogens not detected by the test. Clinical correlation with patient history and other diagnostic information is necessary to determine patient infection status. The  expected result is Negative.  Fact Sheet for Patients: EntrepreneurPulse.com.au  Fact Sheet for Healthcare Providers: IncredibleEmployment.be  This test is not yet approved or cleared by the [place] FDA and  has been authorized for detection and/or diagnosis of SARS-CoV-2 by FDA under an Emergency Use Authorization (EUA).  This EUA will remain in effect (meaning this test can be used) for the duration of  the COVID-19 declaration under Section 564(b)(1) of the A ct, 21 U.S.C. section 360bbb-3(b)(1), unless the authorization is terminated or revoked sooner.     Influenza A by PCR NEGATIVE NEGATIVE Final   Influenza B by PCR NEGATIVE NEGATIVE Final    Comment: (NOTE) The Xpert Xpress SARS-CoV-2/FLU/RSV plus assay is intended as an aid in the diagnosis of influenza from Nasopharyngeal swab specimens and should not be used as a sole basis for treatment. Nasal washings and aspirates are unacceptable for Xpert Xpress SARS-CoV-2/FLU/RSV testing.  Fact  Sheet for Patients: EntrepreneurPulse.com.au  Fact Sheet for Healthcare Providers: IncredibleEmployment.be  This test is not yet approved or cleared by the [place] FDA and has been authorized for detection and/or diagnosis of SARS-CoV-2 by FDA under an Emergency Use Authorization (EUA). This EUA will remain in effect (meaning this test can be used) for the duration of the COVID-19 declaration under Section 564(b)(1) of the Act, 21 U.S.C. section 360bbb-3(b)(1), unless the authorization is terminated or revoked.     Resp Syncytial Virus by PCR NEGATIVE NEGATIVE Final    Comment: (NOTE) Fact Sheet for Patients: EntrepreneurPulse.com.au  Fact Sheet for Healthcare Providers: IncredibleEmployment.be  This test is not yet approved or cleared by the [place] FDA and has been authorized for detection and/or diagnosis of SARS-CoV-2 by FDA under an Emergency Use Authorization (EUA). This EUA will remain in effect (meaning this test can be used) for the duration of the COVID-19 declaration under Section 564(b)(1) of the Act, 21 U.S.C. section 360bbb-3(b)(1), unless the authorization is terminated or revoked.  Performed at [facility], [address], [place], [place] [address]          Radiology Studies: CT Head Wo Contrast  Result Date: 12/16/2022 CLINICAL DATA:  Altered mental status. Weakness and fever. History of dementia. EXAM: CT HEAD WITHOUT CONTRAST TECHNIQUE: Contiguous axial images were obtained from the base of the skull through the vertex without intravenous contrast. RADIATION DOSE REDUCTION: This exam was performed according to the departmental dose-optimization program which includes automated exposure control, adjustment of the mA and/or kV according to patient size and/or use of iterative reconstruction technique. COMPARISON:  Head CT 03/01/2020 FINDINGS: Brain: There is no evidence of an  acute infarct, intracranial hemorrhage, mass, or midline shift. Chronic bilateral parieto-occipital infarcts, left larger than right, as well as small chronic infarcts in the thalami and left cerebellar hemisphere are unchanged. There are also unchanged remote insults in the globi pallidi. Hypodensities elsewhere in the cerebral white matter bilaterally are also similar to the prior study and are nonspecific but compatible with mild-to-moderate chronic small vessel ischemic disease. There is mild-to-moderate cerebral atrophy. Mildly prominent extra-axial CSF over the frontal convexities is unchanged and favored to reflect cerebral atrophy with small chronic subdural hygromas considered less likely. Vascular: Calcified atherosclerosis at the skull base. No suspicious acute vascular hyperdensity. Skull: No fracture or suspicious osseous lesion. Sinuses/Orbits: Paranasal sinuses and mastoid air cells are clear. Bilateral cataract extraction. Other: None. IMPRESSION: 1. No evidence of acute intracranial abnormality. 2. Chronic ischemia with multiple old infarcts as above. Electronically Signed  By: Logan Bores M.D.   On: 12/16/2022 14:48   DG Chest Port 1 View  Result Date: 12/16/2022 CLINICAL DATA:  Sepsis. EXAM: PORTABLE CHEST 1 VIEW COMPARISON:  Chest x-ray from 2021 and multiple recent chest CTs. The most recent is 11/28/2022 FINDINGS: The heart is borderline enlarged but stable. Right ventricular pacer wire is stable. Severe chronic underlying lung disease with emphysema and extensive pulmonary scarring. No obvious acute overlying pulmonary process but superimposed infiltrates could easily be missed. IMPRESSION: Severe chronic underlying lung disease. No obvious acute overlying pulmonary process but superimposed infiltrates could easily be missed. Electronically Signed   By: Marijo Sanes M.D.   On: 12/16/2022 09:40        Scheduled Meds:  albuterol  2.5 mg Nebulization Q6H   finasteride  5 mg Oral  Daily   metoprolol tartrate  37.5 mg Oral BID   nirmatrelvir/ritonavir  3 tablet Oral BID   sertraline  100 mg Oral Daily   sodium chloride flush  3 mL Intravenous Q12H   tamsulosin  0.4 mg Oral QPC supper   Warfarin - Pharmacist Dosing Inpatient   Does not apply q1600   Continuous Infusions:   LOS: 0 days    Time spent: 46mins    Kathie Dike, MD [facility]   If 7PM-7AM, please contact night-coverage [email or web address]  12/17/2022, 8:43 PM

## 2022-12-18 DIAGNOSIS — J189 Pneumonia, unspecified organism: Secondary | ICD-10-CM | POA: Diagnosis not present

## 2022-12-18 DIAGNOSIS — J9611 Chronic respiratory failure with hypoxia: Secondary | ICD-10-CM | POA: Diagnosis not present

## 2022-12-18 DIAGNOSIS — J449 Chronic obstructive pulmonary disease, unspecified: Secondary | ICD-10-CM | POA: Diagnosis not present

## 2022-12-18 DIAGNOSIS — U071 COVID-19: Secondary | ICD-10-CM | POA: Diagnosis not present

## 2022-12-18 LAB — CBC WITH DIFFERENTIAL/PLATELET
Abs Immature Granulocytes: 0.04 10*3/uL (ref 0.00–0.07)
Basophils Absolute: 0.1 10*3/uL (ref 0.0–0.1)
Basophils Relative: 1 %
Eosinophils Absolute: 0 10*3/uL (ref 0.0–0.5)
Eosinophils Relative: 0 %
HCT: 48.2 % (ref 39.0–52.0)
Hemoglobin: 15.3 g/dL (ref 13.0–17.0)
Immature Granulocytes: 0 %
Lymphocytes Relative: 24 %
Lymphs Abs: 2.2 10*3/uL (ref 0.7–4.0)
MCH: 28.7 pg (ref 26.0–34.0)
MCHC: 31.7 g/dL (ref 30.0–36.0)
MCV: 90.4 fL (ref 80.0–100.0)
Monocytes Absolute: 1.7 10*3/uL — ABNORMAL HIGH (ref 0.1–1.0)
Monocytes Relative: 19 %
Neutro Abs: 5.3 10*3/uL (ref 1.7–7.7)
Neutrophils Relative %: 56 %
Platelets: 274 10*3/uL (ref 150–400)
RBC: 5.33 MIL/uL (ref 4.22–5.81)
RDW: 18 % — ABNORMAL HIGH (ref 11.5–15.5)
WBC: 9.3 10*3/uL (ref 4.0–10.5)
nRBC: 0 % (ref 0.0–0.2)

## 2022-12-18 LAB — COMPREHENSIVE METABOLIC PANEL
ALT: 17 U/L (ref 0–44)
AST: 24 U/L (ref 15–41)
Albumin: 3 g/dL — ABNORMAL LOW (ref 3.5–5.0)
Alkaline Phosphatase: 107 U/L (ref 38–126)
Anion gap: 11 (ref 5–15)
BUN: 17 mg/dL (ref 8–23)
CO2: 25 mmol/L (ref 22–32)
Calcium: 8.9 mg/dL (ref 8.9–10.3)
Chloride: 104 mmol/L (ref 98–111)
Creatinine, Ser: 0.78 mg/dL (ref 0.61–1.24)
GFR, Estimated: >60 mL/min (ref >60)
Glucose, Bld: 87 mg/dL (ref 70–99)
Potassium: 3.8 mmol/L (ref 3.5–5.1)
Sodium: 140 mmol/L (ref 135–145)
Total Bilirubin: 0.7 mg/dL (ref 0.3–1.2)
Total Protein: 6.3 g/dL — ABNORMAL LOW (ref 6.5–8.1)

## 2022-12-18 LAB — PROTIME-INR
INR: 2.7 — ABNORMAL HIGH (ref 0.8–1.2)
Prothrombin Time: 28.2 seconds — ABNORMAL HIGH (ref 11.4–15.2)

## 2022-12-18 LAB — C-REACTIVE PROTEIN: CRP: 2.1 mg/dL — ABNORMAL HIGH (ref <1.0)

## 2022-12-18 LAB — MAGNESIUM: Magnesium: 2 mg/dL (ref 1.7–2.4)

## 2022-12-18 MED ORDER — WARFARIN SODIUM 2 MG PO TABS
2.0000 mg | ORAL_TABLET | Freq: Once | ORAL | Status: AC
  Administered 2022-12-18: 2 mg via ORAL
  Filled 2022-12-18: qty 1

## 2022-12-18 NOTE — Progress Notes (Signed)
PROGRESS NOTE    Travis Cruz  UUV:[id number] DOB: 25-Feb-1946 DOA: 12/16/2022 PCP: Dettinger, Fransisca Kaufmann, MD    Brief Narrative:  76 year old male with history of dementia, COPD on home oxygen, chronic respiratory failure on nightly BiPAP, admitted to the hospital with COVID-19 pneumonia and increasing weakness.  He is being treated with Paxlovid and otherwise supportive treatment.  Seen by physical therapy with recommendations for skilled nursing facility.  He is stable for discharge once placement can be arranged.   Assessment & Plan:   Principal Problem:   COVID-19 Active Problems:   Hyperlipidemia   CAD (coronary artery disease)   COPD (chronic obstructive pulmonary disease) (HCC)   History of pulmonary embolus (PE)   Hypertension   Dementia with behavioral disturbance   Chronic respiratory failure with hypoxia (HCC)   CAP (community acquired pneumonia)   COVID-19 pneumonia -Started on Paxlovid -Inflammatory markers are currently not elevated -Continue supportive management with bronchodilators -He has been started on IV antibiotics for any superimposed bacterial component -Procalcitonin 0.15  COPD with chronic hypoxic respiratory failure -No wheezing appreciated at this time -Currently on home oxygen requirement of 3 L -Wife reports that he wears BiPAP at night, will resume  History of PE -Continue on Coumadin -INR therapeutic  Hyperlipidemia -Continue statin  BPH -Continue tamsulosin  Hypertension -Continued on metoprolol -Blood pressure stable  Dementia -His wife reports that he is becoming less verbal -At baseline, he is able to transfer from bed to chair and has been ambulating a little bit with a walker -He has had overall worsening weakness -Seen by PT with recommendations for skilled nursing facility -Suspect that overall mental status likely worsened in the setting of COVID as well.   DVT prophylaxis: Warfarin  Code Status: DNR Family  Communication: Updated wife over the phone 12/20 Disposition Plan: Status is: Observation The patient remains OBS appropriate and will d/c before 2 midnights.     Consultants:    Procedures:    Antimicrobials:      Subjective: More awake and alert today.  Discharge engage in conversation.  Objective: Vitals:   12/18/22 0416 12/18/22 0857 12/18/22 1231 12/18/22 1441  BP: 109/63  117/69   Pulse: 66  62   Resp:      Temp:   97.8 F (36.6 C)   TempSrc:      SpO2: 94% 94% 94% 96%  Weight:      Height:        Intake/Output Summary (Last 24 hours) at 12/18/2022 2023 Last data filed at 12/18/2022 1911 Gross per 24 hour  Intake 1358 ml  Output 950 ml  Net 408 ml   Filed Weights   12/16/22 1600  Weight: 103.4 kg    Examination:  General exam: Wakes up to voice, does not appear to be in distress Respiratory system: Clear to auscultation. Respiratory effort normal. Cardiovascular system: S1 & S2 heard, RRR. No JVD, murmurs, rubs, gallops or clicks. No pedal edema. Gastrointestinal system: Abdomen is nondistended, soft and nontender. No organomegaly or masses felt. Normal bowel sounds heard. Central nervous system:  No focal neurological deficits. Extremities: Symmetric 5 x 5 power. Skin: No rashes, lesions or ulcers Psychiatry: Speech is difficult to comprehend    Data Reviewed: I have personally reviewed following labs and imaging studies  CBC: Recent Labs  Lab 12/16/22 0944 12/17/22 0404 12/18/22 0601  WBC 10.9* 10.4 9.3  NEUTROABS 6.8 6.2 5.3  HGB 17.2* 15.2 15.3  HCT 53.4* 47.5 48.2  MCV  89.1 90.1 90.4  PLT 322 281 454   Basic Metabolic Panel: Recent Labs  Lab 12/16/22 0944 12/17/22 0404 12/18/22 0601  NA 140 140 140  K 4.0 3.9 3.8  CL 103 108 104  CO2 27 25 25   GLUCOSE 93 83 87  BUN 18 15 17   CREATININE 0.83 0.63 0.78  CALCIUM 9.3 8.8* 8.9  MG  --  1.9 2.0   GFR: Estimated Creatinine Clearance: 96 mL/min (by C-G formula based on SCr  of 0.78 mg/dL). Liver Function Tests: Recent Labs  Lab 12/16/22 0944 12/17/22 0404 12/18/22 0601  AST 23 23 24   ALT 20 16 17   ALKPHOS 126 108 107  BILITOT 0.6 0.5 0.7  PROT 7.3 6.0* 6.3*  ALBUMIN 3.5 2.9* 3.0*   No results for input(s): "LIPASE", "AMYLASE" in the last 168 hours. No results for input(s): "AMMONIA" in the last 168 hours. Coagulation Profile: Recent Labs  Lab 12/16/22 0944 12/17/22 0404 12/18/22 0601  INR 2.0* 2.3* 2.7*   Cardiac Enzymes: No results for input(s): "CKTOTAL", "CKMB", "CKMBINDEX", "TROPONINI" in the last 168 hours. BNP (last 3 results) No results for input(s): "PROBNP" in the last 8760 hours. HbA1C: No results for input(s): "HGBA1C" in the last 72 hours. CBG: No results for input(s): "GLUCAP" in the last 168 hours. Lipid Profile: No results for input(s): "CHOL", "HDL", "LDLCALC", "TRIG", "CHOLHDL", "LDLDIRECT" in the last 72 hours. Thyroid Function Tests: No results for input(s): "TSH", "T4TOTAL", "FREET4", "T3FREE", "THYROIDAB" in the last 72 hours. Anemia Panel: Recent Labs    12/16/22 1539  FERRITIN 44   Sepsis Labs: Recent Labs  Lab 12/16/22 0944 12/16/22 1126 12/16/22 1539  PROCALCITON  --   --  0.15  LATICACIDVEN 0.9 0.7  --     Recent Results (from the past 240 hour(s))  Blood Culture (routine x 2)     Status: None (Preliminary result)   Collection Time: 12/16/22  9:44 AM   Specimen: BLOOD  Result Value Ref Range Status   Specimen Description BLOOD BLOOD RIGHT ARM  Final   Special Requests   Final    BOTTLES DRAWN AEROBIC AND ANAEROBIC Blood Culture adequate volume LEFT ANTECUBITAL   Culture   Final    NO GROWTH 2 DAYS Performed at [facility], [address], [place], [place] [address]    Report Status PENDING  Incomplete  Blood Culture (routine x 2)     Status: None (Preliminary result)   Collection Time: 12/16/22  9:44 AM   Specimen: BLOOD  Result Value Ref Range Status   Specimen Description BLOOD BLOOD LEFT  HAND  Final   Special Requests   Final    BOTTLES DRAWN AEROBIC AND ANAEROBIC Blood Culture adequate volume LEFT ANTECUBITAL   Culture   Final    NO GROWTH 2 DAYS Performed at [facility], [address] [place], [place] [address]    Report Status PENDING  Incomplete  Resp panel by RT-PCR (RSV, Flu A&B, Covid) Anterior Nasal Swab     Status: Abnormal   Collection Time: 12/16/22 12:00 PM   Specimen: Anterior Nasal Swab  Result Value Ref Range Status   SARS Coronavirus 2 by RT PCR POSITIVE (A) NEGATIVE Final    Comment: (NOTE) SARS-CoV-2 target nucleic acids are DETECTED.  The SARS-CoV-2 RNA is generally detectable in upper respiratory specimens during the acute phase of infection. Positive results are indicative of the presence of the identified virus, but do not rule out bacterial infection or co-infection with other  pathogens not detected by the test. Clinical correlation with patient history and other diagnostic information is necessary to determine patient infection status. The expected result is Negative.  Fact Sheet for Patients: EntrepreneurPulse.com.au  Fact Sheet for Healthcare Providers: IncredibleEmployment.be  This test is not yet approved or cleared by the [place] FDA and  has been authorized for detection and/or diagnosis of SARS-CoV-2 by FDA under an Emergency Use Authorization (EUA).  This EUA will remain in effect (meaning this test can be used) for the duration of  the COVID-19 declaration under Section 564(b)(1) of the A ct, 21 U.S.C. section 360bbb-3(b)(1), unless the authorization is terminated or revoked sooner.     Influenza A by PCR NEGATIVE NEGATIVE Final   Influenza B by PCR NEGATIVE NEGATIVE Final    Comment: (NOTE) The Xpert Xpress SARS-CoV-2/FLU/RSV plus assay is intended as an aid in the diagnosis of influenza from Nasopharyngeal swab specimens and should not be used as a sole basis for treatment. Nasal  washings and aspirates are unacceptable for Xpert Xpress SARS-CoV-2/FLU/RSV testing.  Fact Sheet for Patients: EntrepreneurPulse.com.au  Fact Sheet for Healthcare Providers: IncredibleEmployment.be  This test is not yet approved or cleared by the [place] FDA and has been authorized for detection and/or diagnosis of SARS-CoV-2 by FDA under an Emergency Use Authorization (EUA). This EUA will remain in effect (meaning this test can be used) for the duration of the COVID-19 declaration under Section 564(b)(1) of the Act, 21 U.S.C. section 360bbb-3(b)(1), unless the authorization is terminated or revoked.     Resp Syncytial Virus by PCR NEGATIVE NEGATIVE Final    Comment: (NOTE) Fact Sheet for Patients: EntrepreneurPulse.com.au  Fact Sheet for Healthcare Providers: IncredibleEmployment.be  This test is not yet approved or cleared by the [place] FDA and has been authorized for detection and/or diagnosis of SARS-CoV-2 by FDA under an Emergency Use Authorization (EUA). This EUA will remain in effect (meaning this test can be used) for the duration of the COVID-19 declaration under Section 564(b)(1) of the Act, 21 U.S.C. section 360bbb-3(b)(1), unless the authorization is terminated or revoked.  Performed at [facility], [address], [place], [place] [address]          Radiology Studies: No results found.      Scheduled Meds:  albuterol  2.5 mg Nebulization Q6H   finasteride  5 mg Oral Daily   metoprolol tartrate  37.5 mg Oral BID   nirmatrelvir/ritonavir  3 tablet Oral BID   sertraline  100 mg Oral Daily   sodium chloride flush  3 mL Intravenous Q12H   tamsulosin  0.4 mg Oral QPC supper   Warfarin - Pharmacist Dosing Inpatient   Does not apply q1600   Continuous Infusions:   LOS: 0 days    Time spent: 51mins    Kathie Dike, MD [facility]   If 7PM-7AM, please  contact night-coverage [email or web address]  12/18/2022, 8:23 PM
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
## 2022-12-19 DIAGNOSIS — Z833 Family history of diabetes mellitus: Secondary | ICD-10-CM | POA: Diagnosis not present

## 2022-12-19 DIAGNOSIS — U071 COVID-19: Secondary | ICD-10-CM | POA: Diagnosis not present

## 2022-12-19 DIAGNOSIS — J189 Pneumonia, unspecified organism: Secondary | ICD-10-CM | POA: Diagnosis not present

## 2022-12-19 DIAGNOSIS — Z86718 Personal history of other venous thrombosis and embolism: Secondary | ICD-10-CM | POA: Diagnosis not present

## 2022-12-19 DIAGNOSIS — J1282 Pneumonia due to coronavirus disease 2019: Secondary | ICD-10-CM | POA: Diagnosis not present

## 2022-12-19 DIAGNOSIS — N400 Enlarged prostate without lower urinary tract symptoms: Secondary | ICD-10-CM | POA: Diagnosis not present

## 2022-12-19 DIAGNOSIS — Z87891 Personal history of nicotine dependence: Secondary | ICD-10-CM | POA: Diagnosis not present

## 2022-12-19 DIAGNOSIS — Z7901 Long term (current) use of anticoagulants: Secondary | ICD-10-CM | POA: Diagnosis not present

## 2022-12-19 DIAGNOSIS — J9611 Chronic respiratory failure with hypoxia: Secondary | ICD-10-CM | POA: Diagnosis not present

## 2022-12-19 DIAGNOSIS — Z9981 Dependence on supplemental oxygen: Secondary | ICD-10-CM | POA: Diagnosis not present

## 2022-12-19 DIAGNOSIS — J449 Chronic obstructive pulmonary disease, unspecified: Secondary | ICD-10-CM | POA: Diagnosis not present

## 2022-12-19 DIAGNOSIS — I252 Old myocardial infarction: Secondary | ICD-10-CM | POA: Diagnosis not present

## 2022-12-19 DIAGNOSIS — I110 Hypertensive heart disease with heart failure: Secondary | ICD-10-CM | POA: Diagnosis not present

## 2022-12-19 DIAGNOSIS — E785 Hyperlipidemia, unspecified: Secondary | ICD-10-CM | POA: Diagnosis not present

## 2022-12-19 DIAGNOSIS — F03C18 Unspecified dementia, severe, with other behavioral disturbance: Secondary | ICD-10-CM | POA: Diagnosis not present

## 2022-12-19 DIAGNOSIS — M1712 Unilateral primary osteoarthritis, left knee: Secondary | ICD-10-CM | POA: Diagnosis not present

## 2022-12-19 DIAGNOSIS — F32A Depression, unspecified: Secondary | ICD-10-CM | POA: Diagnosis not present

## 2022-12-19 DIAGNOSIS — J440 Chronic obstructive pulmonary disease with acute lower respiratory infection: Secondary | ICD-10-CM | POA: Diagnosis not present

## 2022-12-19 DIAGNOSIS — I2510 Atherosclerotic heart disease of native coronary artery without angina pectoris: Secondary | ICD-10-CM | POA: Diagnosis not present

## 2022-12-19 DIAGNOSIS — I255 Ischemic cardiomyopathy: Secondary | ICD-10-CM | POA: Diagnosis not present

## 2022-12-19 DIAGNOSIS — R531 Weakness: Secondary | ICD-10-CM | POA: Diagnosis not present

## 2022-12-19 DIAGNOSIS — Z86711 Personal history of pulmonary embolism: Secondary | ICD-10-CM | POA: Diagnosis not present

## 2022-12-19 DIAGNOSIS — R1310 Dysphagia, unspecified: Secondary | ICD-10-CM | POA: Diagnosis not present

## 2022-12-19 DIAGNOSIS — R69 Illness, unspecified: Secondary | ICD-10-CM | POA: Diagnosis not present

## 2022-12-19 DIAGNOSIS — Z66 Do not resuscitate: Secondary | ICD-10-CM | POA: Diagnosis not present

## 2022-12-19 DIAGNOSIS — J439 Emphysema, unspecified: Secondary | ICD-10-CM | POA: Diagnosis not present

## 2022-12-19 DIAGNOSIS — Z8249 Family history of ischemic heart disease and other diseases of the circulatory system: Secondary | ICD-10-CM | POA: Diagnosis not present

## 2022-12-19 DIAGNOSIS — I5022 Chronic systolic (congestive) heart failure: Secondary | ICD-10-CM | POA: Diagnosis not present

## 2022-12-19 LAB — CBC WITH DIFFERENTIAL/PLATELET
Abs Immature Granulocytes: 0.04 10*3/uL (ref 0.00–0.07)
Basophils Absolute: 0 10*3/uL (ref 0.0–0.1)
Basophils Relative: 0 %
Eosinophils Absolute: 0.2 10*3/uL (ref 0.0–0.5)
Eosinophils Relative: 3 %
HCT: 47.2 % (ref 39.0–52.0)
Hemoglobin: 15.3 g/dL (ref 13.0–17.0)
Immature Granulocytes: 1 %
Lymphocytes Relative: 20 %
Lymphs Abs: 1.7 10*3/uL (ref 0.7–4.0)
MCH: 29 pg (ref 26.0–34.0)
MCHC: 32.4 g/dL (ref 30.0–36.0)
MCV: 89.6 fL (ref 80.0–100.0)
Monocytes Absolute: 1.2 10*3/uL — ABNORMAL HIGH (ref 0.1–1.0)
Monocytes Relative: 14 %
Neutro Abs: 5.5 10*3/uL (ref 1.7–7.7)
Neutrophils Relative %: 62 %
Platelets: 285 10*3/uL (ref 150–400)
RBC: 5.27 MIL/uL (ref 4.22–5.81)
RDW: 17.4 % — ABNORMAL HIGH (ref 11.5–15.5)
WBC: 8.8 10*3/uL (ref 4.0–10.5)
nRBC: 0 % (ref 0.0–0.2)

## 2022-12-19 LAB — COMPREHENSIVE METABOLIC PANEL WITH GFR
ALT: 17 U/L (ref 0–44)
AST: 22 U/L (ref 15–41)
Albumin: 2.9 g/dL — ABNORMAL LOW (ref 3.5–5.0)
Alkaline Phosphatase: 102 U/L (ref 38–126)
Anion gap: 5 (ref 5–15)
BUN: 17 mg/dL (ref 8–23)
CO2: 29 mmol/L (ref 22–32)
Calcium: 9 mg/dL (ref 8.9–10.3)
Chloride: 106 mmol/L (ref 98–111)
Creatinine, Ser: 0.69 mg/dL (ref 0.61–1.24)
GFR, Estimated: >60 mL/min (ref >60)
Glucose, Bld: 92 mg/dL (ref 70–99)
Potassium: 3.8 mmol/L (ref 3.5–5.1)
Sodium: 140 mmol/L (ref 135–145)
Total Bilirubin: 0.6 mg/dL (ref 0.3–1.2)
Total Protein: 6.2 g/dL — ABNORMAL LOW (ref 6.5–8.1)

## 2022-12-19 LAB — MAGNESIUM: Magnesium: 1.9 mg/dL (ref 1.7–2.4)

## 2022-12-19 LAB — PROTIME-INR
INR: 2.7 — ABNORMAL HIGH (ref 0.8–1.2)
Prothrombin Time: 28.6 seconds — ABNORMAL HIGH (ref 11.4–15.2)

## 2022-12-19 LAB — C-REACTIVE PROTEIN: CRP: 1.1 mg/dL — ABNORMAL HIGH (ref <1.0)

## 2022-12-19 MED ORDER — WARFARIN SODIUM 2 MG PO TABS
2.0000 mg | ORAL_TABLET | Freq: Once | ORAL | Status: AC
  Administered 2022-12-19: 2 mg via ORAL
  Filled 2022-12-19: qty 1

## 2022-12-19 MED ORDER — PREDNISONE 20 MG PO TABS
40.0000 mg | ORAL_TABLET | Freq: Every day | ORAL | Status: DC
  Administered 2022-12-19 – 2022-12-22 (×4): 40 mg via ORAL
  Filled 2022-12-19 (×4): qty 2
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
## 2022-12-19 NOTE — NC FL2 (Signed)
[place] LEVEL OF CARE FORM     IDENTIFICATION  Patient Name: Travis Cruz Birthdate: 11/13/1946 Sex: male Admission Date (Current Location): 12/16/2022  [facility] and [place] Number:  [employer] and Address:  [facility] [address] [place]      Provider Number: [id number]  Attending Physician Name and Address:  Kathie Dike, MD  Relative Name and Phone Number:       Current Level of Care: Hospital Recommended Level of Care: [place] Prior Approval Number:    Date Approved/Denied:   PASRR Number: [id number]  Discharge Plan: SNF    Current Diagnoses: Patient Active Problem List   Diagnosis Date Noted   COVID-19 12/16/2022   Primary cancer of left upper lobe of lung ([place]) 06/23/2022   Multiple pulmonary nodules determined by computed tomography of lung 62/37/6283   Systolic and diastolic CHF, chronic ([place]) 12/03/2020   Cardiomyopathy ([place]) Iscemic and Non-Ischemic 11/20/2020   CAP (community acquired pneumonia) 02/27/2020   Chronic respiratory failure with hypoxia ([place]) 01/17/2020   Primary cancer of right upper lobe of lung ([place]) 10/04/2019   Dementia with behavioral disturbance 09/08/2019   Senile purpura ([place]) 11/02/2017   Cancer of bronchus of right lower lobe ([place]) 02/25/2017   History of DVT (deep vein thrombosis) 12/04/2016   History of pulmonary embolus (PE) 12/04/2016   History of CVA (cerebrovascular accident) 12/04/2016   Macular degeneration, dry 12/20/2015   COPD (chronic obstructive pulmonary disease) ([place])    Implantable cardioverter-defibrillator single chamber [employer] 10/05/2013   Hyperlipidemia 05/13/2013   CAD (coronary artery disease) 05/13/2013   History of ventricular fibrillation 03/28/2013   Generalized ischemic myocardial dysfunction 03/15/2013   History of non-ST elevation myocardial infarction (NSTEMI) 01/27/2013   Patent arterial duct 03/10/2012    Obstructive sleep apnea syndrome in adult 03/10/2012   Hypertension 03/24/2011    Orientation RESPIRATION BLADDER Height & Weight     Self  O2 (3L) External catheter Weight: 228 lb (103.4 kg) Height:  5\' 9"  (175.3 cm)  BEHAVIORAL SYMPTOMS/MOOD NEUROLOGICAL BOWEL NUTRITION STATUS    Convulsions/Seizures (history) Incontinent Diet (Heart healthy. See d/c summary for updates.)  AMBULATORY STATUS COMMUNICATION OF NEEDS Skin   Extensive Assist Non-Verbally Skin abrasions, Bruising                       Personal Care Assistance Level of Assistance  Bathing, Feeding, Dressing Bathing Assistance: Maximum assistance Feeding assistance: Limited assistance Dressing Assistance: Maximum assistance     Functional Limitations Info  Sight, Hearing, Speech Sight Info: Adequate Hearing Info: Adequate Speech Info: Impaired    SPECIAL CARE FACTORS FREQUENCY  PT (By licensed PT), OT (By licensed OT)     PT Frequency: 5x weekly OT Frequency: 5x weekly            Contractures      Additional Factors Info  Code Status, Allergies, Isolation Precautions, Psychotropic Code Status Info: DNR Allergies Info: Ativan (lorazepam), Cefuroxime Axetil, Zinacef (cefuroxime) Psychotropic Info: Zoloft   Isolation Precautions Info: Airborne/contact precautions   COVID + 12/16/22     Current Medications (12/19/2022):  This is the current hospital active medication list Current Facility-Administered Medications  Medication Dose Route Frequency Provider Last Rate Last Admin   acetaminophen (TYLENOL) tablet 650 mg  650 mg Oral Q6H PRN Darliss Cheney, MD   650 mg at 12/16/22 2240   albuterol (PROVENTIL) (2.5 MG/3ML) 0.083% nebulizer solution 2.5 mg  2.5 mg Nebulization Q6H Pahwani, Einar Grad, MD   2.5 mg at 12/19/22 4782   albuterol (PROVENTIL) (2.5 MG/3ML) 0.083% nebulizer solution 2.5 mg  2.5 mg Nebulization Q4H PRN Kathie Dike, MD       albuterol (VENTOLIN HFA) 108 (90 Base) MCG/ACT inhaler 2 puff   2 puff Inhalation Q4H PRN Pahwani, Einar Grad, MD       chlorpheniramine-HYDROcodone (TUSSIONEX) 10-8 MG/5ML suspension 5 mL  5 mL Oral Q12H PRN Darliss Cheney, MD       finasteride (PROSCAR) tablet 5 mg  5 mg Oral Daily Pahwani, Ravi, MD   5 mg at 12/19/22 9562   guaiFENesin-dextromethorphan (ROBITUSSIN DM) 100-10 MG/5ML syrup 10 mL  10 mL Oral Q4H PRN Darliss Cheney, MD   10 mL at 12/16/22 2240   metoprolol tartrate (LOPRESSOR) tablet 37.5 mg  37.5 mg Oral BID Darliss Cheney, MD   37.5 mg at 12/19/22 1308   nirmatrelvir/ritonavir EUA (PAXLOVID) 3 tablet  3 tablet Oral BID Darliss Cheney, MD   3 tablet at 12/19/22 0822   ondansetron (ZOFRAN) tablet 4 mg  4 mg Oral Q6H PRN Darliss Cheney, MD       Or   ondansetron (ZOFRAN) injection 4 mg  4 mg Intravenous Q6H PRN Pahwani, Ravi, MD       polyethylene glycol (MIRALAX / GLYCOLAX) packet 17 g  17 g Oral Daily PRN Pahwani, Einar Grad, MD       sertraline (ZOLOFT) tablet 100 mg  100 mg Oral Daily Pahwani, Ravi, MD   100 mg at 12/19/22 6578   sodium chloride flush (NS) 0.9 % injection 3 mL  3 mL Intravenous Q12H Darliss Cheney, MD   3 mL at 12/18/22 2355   tamsulosin (FLOMAX) capsule 0.4 mg  0.4 mg Oral QPC supper Darliss Cheney, MD   0.4 mg at 12/18/22 1623   warfarin (COUMADIN) tablet 2 mg  2 mg Oral ONCE-1600 Kathie Dike, MD       Warfarin - Pharmacist Dosing Inpatient   Does not apply [id number] Darliss Cheney, MD   Given at 12/18/22 1623     Discharge Medications: Please see discharge summary for a list of discharge medications.  Relevant Imaging Results:  Relevant Lab Results:   Additional Information SSN: [id number].  Shade Flood, LCSW

## 2022-12-19 NOTE — Progress Notes (Signed)
PROGRESS NOTE    Travis Cruz  QHU:[id number] DOB: [birth year] DOA: 12/16/2022 PCP: Dettinger, Fransisca Kaufmann, MD    Brief Narrative:  76 year old male with history of dementia, COPD on home oxygen, chronic respiratory failure on nightly BiPAP, admitted to the hospital with COVID-19 pneumonia and increasing weakness.  He is being treated with Paxlovid and otherwise supportive treatment.  Seen by physical therapy with recommendations for skilled nursing facility.  He is stable for discharge once placement can be arranged.   Assessment & Plan:   Principal Problem:   COVID-19 Active Problems:   Hyperlipidemia   CAD (coronary artery disease)   COPD (chronic obstructive pulmonary disease) (HCC)   History of pulmonary embolus (PE)   Hypertension   Dementia with behavioral disturbance   Chronic respiratory failure with hypoxia (HCC)   CAP (community acquired pneumonia)   COVID-19 pneumonia -Started on Paxlovid -Inflammatory markers minimally elevated -Continue supportive management with bronchodilators -He has been started on IV antibiotics for any superimposed bacterial component -Procalcitonin 0.15 -He is slightly more hypoxic today and has more wheezing/cough -Will start a course of prednisone  COPD with chronic hypoxic respiratory failure -Noted to have wheezing and cough despite bronchodilators -His normal on 3 L of oxygen at home, currently on 4 L -Start a course of prednisone -Wife reports that he wears BiPAP at night, will resume  History of PE -Continue on Coumadin -INR therapeutic  Hyperlipidemia -Continue statin  BPH -Continue tamsulosin  Hypertension -Continued on metoprolol -Blood pressure stable  Dementia -His wife reports that he is becoming less verbal -At baseline, he is able to transfer from bed to chair and has been ambulating a little bit with a walker -He has had overall worsening weakness -Seen by PT with recommendations for skilled nursing  facility -Currently, mental status does not appear to be too far off baseline   DVT prophylaxis: Warfarin  Code Status: DNR Family Communication: Updated wife over the phone 12/22 Disposition Plan: Status is: Inpatient The patient will require care spanning > 2 midnights and should be moved to inpatient because: Needs placement     Consultants:    Procedures:    Antimicrobials:      Subjective: Opens eyes to voice.  Speech is difficult to comprehend.  Nods yes and shakes head no to answer questions  Objective: Vitals:   12/18/22 2236 12/19/22 0409 12/19/22 0815 12/19/22 1505  BP:  106/66    Pulse: 67 (!) 59    Resp: 18 18    Temp:  98.1 F (36.7 C)    TempSrc:  Oral    SpO2: 92% 92% 95% 95%  Weight:      Height:        Intake/Output Summary (Last 24 hours) at 12/19/2022 1746 Last data filed at 12/19/2022 1607 Gross per 24 hour  Intake --  Output 1900 ml  Net -1900 ml   Filed Weights   12/16/22 1600  Weight: 103.4 kg    Examination:  General exam: Wakes up to voice, does not appear to be in distress Respiratory system: Bilateral rhonchi with wheezing Cardiovascular system: S1 & S2 heard, RRR. No JVD, murmurs, rubs, gallops or clicks. No pedal edema. Gastrointestinal system: Abdomen is nondistended, soft and nontender. No organomegaly or masses felt. Normal bowel sounds heard. Central nervous system:  No focal neurological deficits. Extremities: Symmetric 5 x 5 power. Skin: No rashes, lesions or ulcers Psychiatry: Speech is difficult to comprehend    Data Reviewed: I have personally reviewed  following labs and imaging studies  CBC: Recent Labs  Lab 12/16/22 0944 12/17/22 0404 12/18/22 0601 12/19/22 0508  WBC 10.9* 10.4 9.3 8.8  NEUTROABS 6.8 6.2 5.3 5.5  HGB 17.2* 15.2 15.3 15.3  HCT 53.4* 47.5 48.2 47.2  MCV 89.1 90.1 90.4 89.6  PLT 322 281 274 144   Basic Metabolic Panel: Recent Labs  Lab 12/16/22 0944 12/17/22 0404 12/18/22 0601  12/19/22 0508  NA 140 140 140 140  K 4.0 3.9 3.8 3.8  CL 103 108 104 106  CO2 27 25 25 29   GLUCOSE 93 83 87 92  BUN 18 15 17 17   CREATININE 0.83 0.63 0.78 0.69  CALCIUM 9.3 8.8* 8.9 9.0  MG  --  1.9 2.0 1.9   GFR: Estimated Creatinine Clearance: 96 mL/min (by C-G formula based on SCr of 0.69 mg/dL). Liver Function Tests: Recent Labs  Lab 12/16/22 0944 12/17/22 0404 12/18/22 0601 12/19/22 0508  AST 23 23 24 22   ALT 20 16 17 17   ALKPHOS 126 108 107 102  BILITOT 0.6 0.5 0.7 0.6  PROT 7.3 6.0* 6.3* 6.2*  ALBUMIN 3.5 2.9* 3.0* 2.9*   No results for input(s): "LIPASE", "AMYLASE" in the last 168 hours. No results for input(s): "AMMONIA" in the last 168 hours. Coagulation Profile: Recent Labs  Lab 12/16/22 0944 12/17/22 0404 12/18/22 0601 12/19/22 0508  INR 2.0* 2.3* 2.7* 2.7*   Cardiac Enzymes: No results for input(s): "CKTOTAL", "CKMB", "CKMBINDEX", "TROPONINI" in the last 168 hours. BNP (last 3 results) No results for input(s): "PROBNP" in the last 8760 hours. HbA1C: No results for input(s): "HGBA1C" in the last 72 hours. CBG: No results for input(s): "GLUCAP" in the last 168 hours. Lipid Profile: No results for input(s): "CHOL", "HDL", "LDLCALC", "TRIG", "CHOLHDL", "LDLDIRECT" in the last 72 hours. Thyroid Function Tests: No results for input(s): "TSH", "T4TOTAL", "FREET4", "T3FREE", "THYROIDAB" in the last 72 hours. Anemia Panel: No results for input(s): "VITAMINB12", "FOLATE", "FERRITIN", "TIBC", "IRON", "RETICCTPCT" in the last 72 hours.  Sepsis Labs: Recent Labs  Lab 12/16/22 0944 12/16/22 1126 12/16/22 1539  PROCALCITON  --   --  0.15  LATICACIDVEN 0.9 0.7  --     Recent Results (from the past 240 hour(s))  Blood Culture (routine x 2)     Status: None (Preliminary result)   Collection Time: 12/16/22  9:44 AM   Specimen: BLOOD  Result Value Ref Range Status   Specimen Description BLOOD BLOOD RIGHT ARM  Final   Special Requests   Final    BOTTLES  DRAWN AEROBIC AND ANAEROBIC Blood Culture adequate volume LEFT ANTECUBITAL   Culture   Final    NO GROWTH 3 DAYS Performed at [facility], [address] [place], [place] [address]    Report Status PENDING  Incomplete  Blood Culture (routine x 2)     Status: None (Preliminary result)   Collection Time: 12/16/22  9:44 AM   Specimen: BLOOD  Result Value Ref Range Status   Specimen Description BLOOD BLOOD LEFT HAND  Final   Special Requests   Final    BOTTLES DRAWN AEROBIC AND ANAEROBIC Blood Culture adequate volume LEFT ANTECUBITAL   Culture   Final    NO GROWTH 3 DAYS Performed at [facility], [address] [place], [place] [address]    Report Status PENDING  Incomplete  Resp panel by RT-PCR (RSV, Flu A&B, Covid) Anterior Nasal Swab     Status: Abnormal   Collection Time: 12/16/22 12:00 PM  Specimen: Anterior Nasal Swab  Result Value Ref Range Status   SARS Coronavirus 2 by RT PCR POSITIVE (A) NEGATIVE Final    Comment: (NOTE) SARS-CoV-2 target nucleic acids are DETECTED.  The SARS-CoV-2 RNA is generally detectable in upper respiratory specimens during the acute phase of infection. Positive results are indicative of the presence of the identified virus, but do not rule out bacterial infection or co-infection with other pathogens not detected by the test. Clinical correlation with patient history and other diagnostic information is necessary to determine patient infection status. The expected result is Negative.  Fact Sheet for Patients: EntrepreneurPulse.com.au  Fact Sheet for Healthcare Providers: IncredibleEmployment.be  This test is not yet approved or cleared by the [place] FDA and  has been authorized for detection and/or diagnosis of SARS-CoV-2 by FDA under an Emergency Use Authorization (EUA).  This EUA will remain in effect (meaning this test can be used) for the duration of  the COVID-19 declaration under Section  564(b)(1) of the A ct, 21 U.S.C. section 360bbb-3(b)(1), unless the authorization is terminated or revoked sooner.     Influenza A by PCR NEGATIVE NEGATIVE Final   Influenza B by PCR NEGATIVE NEGATIVE Final    Comment: (NOTE) The Xpert Xpress SARS-CoV-2/FLU/RSV plus assay is intended as an aid in the diagnosis of influenza from Nasopharyngeal swab specimens and should not be used as a sole basis for treatment. Nasal washings and aspirates are unacceptable for Xpert Xpress SARS-CoV-2/FLU/RSV testing.  Fact Sheet for Patients: EntrepreneurPulse.com.au  Fact Sheet for Healthcare Providers: IncredibleEmployment.be  This test is not yet approved or cleared by the [place] FDA and has been authorized for detection and/or diagnosis of SARS-CoV-2 by FDA under an Emergency Use Authorization (EUA). This EUA will remain in effect (meaning this test can be used) for the duration of the COVID-19 declaration under Section 564(b)(1) of the Act, 21 U.S.C. section 360bbb-3(b)(1), unless the authorization is terminated or revoked.     Resp Syncytial Virus by PCR NEGATIVE NEGATIVE Final    Comment: (NOTE) Fact Sheet for Patients: EntrepreneurPulse.com.au  Fact Sheet for Healthcare Providers: IncredibleEmployment.be  This test is not yet approved or cleared by the [place] FDA and has been authorized for detection and/or diagnosis of SARS-CoV-2 by FDA under an Emergency Use Authorization (EUA). This EUA will remain in effect (meaning this test can be used) for the duration of the COVID-19 declaration under Section 564(b)(1) of the Act, 21 U.S.C. section 360bbb-3(b)(1), unless the authorization is terminated or revoked.  Performed at [facility], [address] [place], [place] [address]          Radiology Studies: No results found.      Scheduled Meds:  albuterol  2.5 mg Nebulization Q6H    finasteride  5 mg Oral Daily   metoprolol tartrate  37.5 mg Oral BID   nirmatrelvir/ritonavir  3 tablet Oral BID   sertraline  100 mg Oral Daily   sodium chloride flush  3 mL Intravenous Q12H   tamsulosin  0.4 mg Oral QPC supper   Warfarin - Pharmacist Dosing Inpatient   Does not apply q1600   Continuous Infusions:   LOS: 0 days    Time spent: 54mins    Kathie Dike, MD [facility]   If 7PM-7AM, please contact night-coverage [email or web address]  12/19/2022, 5:46 PM

## 2022-12-19 NOTE — TOC Progression Note (Signed)
Transition of Care [facility]) - Progression Note    Patient Details  Name: Travis Cruz MRN: [id number] Date of Birth: 09-16-1946  [facility]) CM/SW Contact  Shade Flood, LCSW Phone Number: 12/19/2022, 11:17 AM  Clinical Narrative:     TOC following. Md and PT indicating that they anticipate pt will need SNF at dc. Updated pt's wife who is in agreement. Discussed bed offer from [facility] and wife accepts. Updated Kerri at [facility]. She will call wife today.  [facility] can accept pt on Monday 12/25 after the Covid quarantine is complete.  TOC will follow.  Expected Discharge Plan: [place] Barriers to Discharge: Continued Medical Work up  Expected Discharge Plan and Services In-house Referral: Clinical Social Work   Post Acute Care Choice: [place] Living arrangements for the past 2 months: Single Family Home                                       Social Determinants of Health (SDOH) Interventions SDOH Screenings   Food Insecurity: No Food Insecurity (04/16/2022)  Housing: Low Risk  (04/16/2022)  Transportation Needs: No Transportation Needs (04/16/2022)  Alcohol Screen: Low Risk  (04/16/2022)  Depression (PHQ2-9): Medium Risk (05/01/2022)  Financial Resource Strain: Low Risk  (04/16/2022)  Physical Activity: Inactive (04/16/2022)  Social Connections: Moderately Isolated (04/16/2022)  Stress: No Stress Concern Present (04/16/2022)  Tobacco Use: Medium Risk (12/16/2022)    Readmission Risk Interventions    12/13/2020   12:35 PM  Readmission Risk Prevention Plan  Transportation Screening Complete  Medication Review ([place]) Complete  PCP or Specialist appointment within 3-5 days of discharge Not Complete  PCP/Specialist Appt Not Complete comments Patient will see SNF medical director  [place] or [place] Complete  SW Recovery Care/Counseling Consult Complete  Palliative Care Screening Not [place] Complete

## 2022-12-19 NOTE — Progress Notes (Signed)
Spoke to wife, gave update on patient.

## 2022-12-19 NOTE — Progress Notes (Signed)
Physical Therapy Treatment Patient Details Name: Travis Cruz MRN: [id number] DOB: 1946/07/23 Today's Date: 12/19/2022   History of Present Illness Travis Cruz is a 76 y.o. male with medical history significant of dementia, COPD, chronic hypoxic respiratory failure, BiPAP dependent at night, CAD, hypertension, CHF, PE on Coumadin who was brought into the emergency department with a complaint of cough and fever.  Patient has severe dementia, he rarely talks, during my encounter, no family was present at bedside and he did not talk to me at all.  He appeared sick.  I called his wife and was able to gather the history and was able to verify the history provided by the ED physician in addition to the fact that patient typically only can walk a few steps from bed to chair and today he was unable to do so as well.  He had low-grade fever of 100.1 at home and he has been coughing since a few days.  The last time he had COVID-vaccine was about 2 years ago.  Wife does not recall anybody sick around him.  No recent travel.    PT Comments    Patient presents alert and cooperative with therapy and nonverbal throughout visit.  Patient demonstrates slow labored movement for sitting up at bedside with frequent leaning/falling to the left once seated, fair/poor carryover for completing BLE exercises requiring frequent cueing and active assistance, stood x 2 trials for up to 2-3 minutes, but unable to take steps or transfer to chair due to weakness.  Patient put back to bed with Mod/max assist to reposition self in bed with fair return for pulling self up using BUE with bed in head down position.  Patient will benefit from continued skilled physical therapy in hospital and recommended venue below to increase strength, balance, endurance for safe ADLs and gait.     Recommendations for follow up therapy are one component of a multi-disciplinary discharge planning process, led by the attending physician.   Recommendations may be updated based on patient status, additional functional criteria and insurance authorization.  Follow Up Recommendations  Skilled nursing-short term rehab (<3 hours/day) Can patient physically be transported by private vehicle: No   Assistance Recommended at Discharge Intermittent Supervision/Assistance  Patient can return home with the following A lot of help with bathing/dressing/bathroom;A lot of help with walking and/or transfers;Help with stairs or ramp for entrance;Assistance with cooking/housework   Equipment Recommendations  None recommended by PT    Recommendations for Other Services       Precautions / Restrictions Precautions Precautions: Fall Restrictions Weight Bearing Restrictions: No     Mobility  Bed Mobility Overal bed mobility: Needs Assistance Bed Mobility: Supine to Sit, Sit to Supine     Supine to sit: Mod assist, Max assist Sit to supine: Max assist   General bed mobility comments: increased time, labored movement, frequent verbal/tactile cueing for following instructions    Transfers Overall transfer level: Needs assistance Equipment used: Rolling walker (2 wheels) Transfers: Sit to/from Stand Sit to Stand: Max assist           General transfer comment: poor tolerance for standing due to BLE weakness    Ambulation/Gait                   Stairs             Wheelchair Mobility    Modified Rankin (Stroke Patients Only)       Balance Overall balance assessment: Needs assistance Sitting-balance  support: Feet supported, No upper extremity supported Sitting balance-Leahy Scale: Poor Sitting balance - Comments: fair/poor seated at EOB Postural control: Left lateral lean Standing balance support: Reliant on assistive device for balance, During functional activity, Bilateral upper extremity supported Standing balance-Leahy Scale: Poor Standing balance comment: using RW                             Cognition Arousal/Alertness: Awake/alert Behavior During Therapy: Flat affect Overall Cognitive Status: History of cognitive impairments - at baseline                                          Exercises General Exercises - Lower Extremity Long Arc Quad: Seated, AAROM, Strengthening, AROM, 10 reps, Both Hip ABduction/ADduction: Seated, AAROM, Strengthening, Both, 5 reps    General Comments        Pertinent Vitals/Pain Pain Assessment Pain Assessment: No/denies pain    Home Living                          Prior Function            PT Goals (current goals can now be found in the care plan section) Acute Rehab PT Goals Patient Stated Goal: not stated PT Goal Formulation: With patient Time For Goal Achievement: 12/31/22 Potential to Achieve Goals: Good Progress towards PT goals: Progressing toward goals    Frequency    Min 3X/week      PT Plan Current plan remains appropriate    Co-evaluation              AM-PAC PT "6 Clicks" Mobility   Outcome Measure  Help needed turning from your back to your side while in a flat bed without using bedrails?: A Lot Help needed moving from lying on your back to sitting on the side of a flat bed without using bedrails?: A Lot Help needed moving to and from a bed to a chair (including a wheelchair)?: Total Help needed standing up from a chair using your arms (e.g., wheelchair or bedside chair)?: A Lot Help needed to walk in hospital room?: Total Help needed climbing 3-5 steps with a railing? : Total 6 Click Score: 9    End of Session Equipment Utilized During Treatment: Oxygen Activity Tolerance: Patient tolerated treatment well;Patient limited by fatigue Patient left: in bed;with call bell/phone within reach Nurse Communication: Mobility status PT Visit Diagnosis: Unsteadiness on feet (R26.81);Other abnormalities of gait and mobility (R26.89);Muscle weakness (generalized) (M62.81)      Time: 1950-9326 PT Time Calculation (min) (ACUTE ONLY): 31 min  Charges:  $Therapeutic Exercise: 8-22 mins $Therapeutic Activity: 8-22 mins                     1:46 PM, 12/19/22 [name], MPT Physical Therapist with [facility] [phone] office [phone] mobile phone

## 2022-12-20 DIAGNOSIS — F03918 Unspecified dementia, unspecified severity, with other behavioral disturbance: Secondary | ICD-10-CM

## 2022-12-20 DIAGNOSIS — J189 Pneumonia, unspecified organism: Secondary | ICD-10-CM | POA: Diagnosis not present

## 2022-12-20 DIAGNOSIS — J9611 Chronic respiratory failure with hypoxia: Secondary | ICD-10-CM | POA: Diagnosis not present

## 2022-12-20 DIAGNOSIS — U071 COVID-19: Secondary | ICD-10-CM | POA: Diagnosis not present

## 2022-12-20 DIAGNOSIS — J449 Chronic obstructive pulmonary disease, unspecified: Secondary | ICD-10-CM | POA: Diagnosis not present

## 2022-12-20 LAB — COMPREHENSIVE METABOLIC PANEL
ALT: 17 U/L (ref 0–44)
AST: 23 U/L (ref 15–41)
Albumin: 3.1 g/dL — ABNORMAL LOW (ref 3.5–5.0)
Alkaline Phosphatase: 109 U/L (ref 38–126)
Anion gap: 7 (ref 5–15)
BUN: 14 mg/dL (ref 8–23)
CO2: 27 mmol/L (ref 22–32)
Calcium: 9.3 mg/dL (ref 8.9–10.3)
Chloride: 106 mmol/L (ref 98–111)
Creatinine, Ser: 0.58 mg/dL — ABNORMAL LOW (ref 0.61–1.24)
GFR, Estimated: >60 mL/min (ref >60)
Glucose, Bld: 134 mg/dL — ABNORMAL HIGH (ref 70–99)
Potassium: 4.5 mmol/L (ref 3.5–5.1)
Sodium: 140 mmol/L (ref 135–145)
Total Bilirubin: 0.6 mg/dL (ref 0.3–1.2)
Total Protein: 6.5 g/dL (ref 6.5–8.1)

## 2022-12-20 LAB — CBC WITH DIFFERENTIAL/PLATELET
Abs Immature Granulocytes: 0.02 10*3/uL (ref 0.00–0.07)
Basophils Absolute: 0 10*3/uL (ref 0.0–0.1)
Basophils Relative: 0 %
Eosinophils Absolute: 0 10*3/uL (ref 0.0–0.5)
Eosinophils Relative: 0 %
HCT: 48.3 % (ref 39.0–52.0)
Hemoglobin: 15.5 g/dL (ref 13.0–17.0)
Immature Granulocytes: 0 %
Lymphocytes Relative: 22 %
Lymphs Abs: 1.4 10*3/uL (ref 0.7–4.0)
MCH: 28.6 pg (ref 26.0–34.0)
MCHC: 32.1 g/dL (ref 30.0–36.0)
MCV: 89.1 fL (ref 80.0–100.0)
Monocytes Absolute: 0.2 10*3/uL (ref 0.1–1.0)
Monocytes Relative: 4 %
Neutro Abs: 4.8 10*3/uL (ref 1.7–7.7)
Neutrophils Relative %: 74 %
Platelets: 327 10*3/uL (ref 150–400)
RBC: 5.42 MIL/uL (ref 4.22–5.81)
RDW: 17.1 % — ABNORMAL HIGH (ref 11.5–15.5)
WBC: 6.4 10*3/uL (ref 4.0–10.5)
nRBC: 0 % (ref 0.0–0.2)

## 2022-12-20 LAB — PROTIME-INR
INR: 2.5 — ABNORMAL HIGH (ref 0.8–1.2)
Prothrombin Time: 26.9 seconds — ABNORMAL HIGH (ref 11.4–15.2)

## 2022-12-20 LAB — MAGNESIUM: Magnesium: 2 mg/dL (ref 1.7–2.4)

## 2022-12-20 LAB — C-REACTIVE PROTEIN: CRP: 0.7 mg/dL (ref <1.0)

## 2022-12-20 MED ORDER — ALBUTEROL SULFATE (2.5 MG/3ML) 0.083% IN NEBU
2.5000 mg | INHALATION_SOLUTION | Freq: Two times a day (BID) | RESPIRATORY_TRACT | Status: DC
  Administered 2022-12-21 – 2022-12-22 (×3): 2.5 mg via RESPIRATORY_TRACT
  Filled 2022-12-20 (×3): qty 3

## 2022-12-20 MED ORDER — WARFARIN SODIUM 2 MG PO TABS
2.0000 mg | ORAL_TABLET | Freq: Once | ORAL | Status: AC
  Administered 2022-12-20: 2 mg via ORAL
  Filled 2022-12-20: qty 1

## 2022-12-20 NOTE — Hospital Course (Signed)
Mr. Boursiquot is a 76 yo male with PMH dementia, COPD, chronic hypoxic respiratory failure who presented with worsening weakness.  Workup was notable for COVID-19 infection and he was started on Paxlovid and supportive care.  He was evaluated by PT with recommendations for SNF.

## 2022-12-20 NOTE — Progress Notes (Signed)
Progress Note    Travis Cruz   SWF:[id number]  DOB: 08-Jan-1946  DOA: 12/16/2022     1 PCP: Dettinger, Fransisca Kaufmann, MD  Initial CC: Weakness  Hospital Course: [name] is a 76 yo male with PMH dementia, COPD, chronic hypoxic respiratory failure who presented with worsening weakness.  Workup was notable for COVID-19 infection and he was started on Paxlovid and supportive care.  He was evaluated by PT with recommendations for SNF.  Interval History:  No events overnight.  Resting in bed comfortably when seen this morning.  Able to follow commands.  Assessment and Plan:  COVID-19 pneumonia -Started on Paxlovid -Inflammatory markers minimally elevated -Continue supportive management with bronchodilators -He has been started on IV antibiotics for any superimposed bacterial component -Procalcitonin 0.15 -Steroid course started for some hypoxia as well   COPD with chronic hypoxic respiratory failure -Noted to have wheezing and cough despite bronchodilators -His normal on 3 L of oxygen at home; was slightly increased to 4 L -Started a course of prednisone -Wife reports that he wears BiPAP at night, will resume   History of PE -Continue on Coumadin -INR therapeutic   Hyperlipidemia -Continue statin   BPH -Continue tamsulosin   Hypertension -Continued on metoprolol -Blood pressure stable   Dementia -His wife reports that he is becoming less verbal -At baseline, he is able to transfer from bed to chair and has been ambulating a little bit with a walker -He has had overall worsening weakness -Seen by PT with recommendations for skilled nursing facility -Currently, mental status does not appear to be too far off baseline  Old records reviewed in assessment of this patient  Antimicrobials: Paxlovid 12/19 >> current   DVT prophylaxis:   warfarin (COUMADIN) tablet 2 mg   Code Status:   Code Status: DNR  Mobility Assessment (last 72 hours)     Mobility  Assessment     Row Name 12/19/22 1342 12/19/22 0900 12/18/22 2048 12/18/22 1016 12/17/22 2242   Does patient have an order for bedrest or is patient medically unstable -- Yes- Bedfast (Level 1) - Complete Yes- Bedfast (Level 1) - Complete Yes- Bedfast (Level 1) - Complete Yes- Bedfast (Level 1) - Complete   What is the highest level of mobility based on the progressive mobility assessment? Level 3 (Stands with assist) - Balance while standing  and cannot march in place Level 1 (Bedfast) - Unable to balance while sitting on edge of bed Level 1 (Bedfast) - Unable to balance while sitting on edge of bed Level 1 (Bedfast) - Unable to balance while sitting on edge of bed Level 1 (Bedfast) - Unable to balance while sitting on edge of bed   Is the above level different from baseline mobility prior to current illness? -- Yes - Recommend PT order Yes - Recommend PT order Yes - Recommend PT order Yes - Recommend PT order            Barriers to discharge:  Disposition Plan:  SNF Status is: Inpt  Objective: Blood pressure 127/73, pulse (!) 57, temperature 97.8 F (36.6 C), temperature source Oral, resp. rate 16, height 5\' 9"  (1.753 m), weight 103.4 kg, SpO2 95 %.  Examination:  Physical Exam Constitutional:      Comments: Chronically ill-appearing elderly gentleman lying in bed in no distress  HENT:     Head: Normocephalic and atraumatic.     Mouth/Throat:     Mouth: Mucous membranes are moist.  Eyes:  Extraocular Movements: Extraocular movements intact.  Cardiovascular:     Rate and Rhythm: Normal rate and regular rhythm.  Pulmonary:     Effort: Pulmonary effort is normal.     Comments: Coarse breath sounds bilaterally, no wheezing Abdominal:     General: Bowel sounds are normal. There is no distension.     Palpations: Abdomen is soft.     Tenderness: There is no abdominal tenderness.  Musculoskeletal:        General: Normal range of motion.     Cervical back: Normal range of motion.   Skin:    General: Skin is warm and dry.  Neurological:     Mental Status: Mental status is at baseline.     Comments: Follows commands and moves all 4 extremities; underlying dementia appreciated      Consultants:    Procedures:    Data Reviewed: Results for orders placed or performed during the hospital encounter of 12/16/22 (from the past 24 hour(s))  Comprehensive metabolic panel     Status: Abnormal   Collection Time: 12/20/22  5:32 AM  Result Value Ref Range   Sodium 140 135 - 145 mmol/L   Potassium 4.5 3.5 - 5.1 mmol/L   Chloride 106 98 - 111 mmol/L   CO2 27 22 - 32 mmol/L   Glucose, Bld 134 (H) 70 - 99 mg/dL   BUN 14 8 - 23 mg/dL   Creatinine, Ser 0.58 (L) 0.61 - 1.24 mg/dL   Calcium 9.3 8.9 - 10.3 mg/dL   Total Protein 6.5 6.5 - 8.1 g/dL   Albumin 3.1 (L) 3.5 - 5.0 g/dL   AST 23 15 - 41 U/L   ALT 17 0 - 44 U/L   Alkaline Phosphatase 109 38 - 126 U/L   Total Bilirubin 0.6 0.3 - 1.2 mg/dL   GFR, Estimated >60 >60 mL/min   Anion gap 7 5 - 15  CBC with Differential/Platelet     Status: Abnormal   Collection Time: 12/20/22  5:32 AM  Result Value Ref Range   WBC 6.4 4.0 - 10.5 K/uL   RBC 5.42 4.22 - 5.81 MIL/uL   Hemoglobin 15.5 13.0 - 17.0 g/dL   HCT 48.3 39.0 - 52.0 %   MCV 89.1 80.0 - 100.0 fL   MCH 28.6 26.0 - 34.0 pg   MCHC 32.1 30.0 - 36.0 g/dL   RDW 17.1 (H) 11.5 - 15.5 %   Platelets 327 150 - 400 K/uL   nRBC 0.0 0.0 - 0.2 %   Neutrophils Relative % 74 %   Neutro Abs 4.8 1.7 - 7.7 K/uL   Lymphocytes Relative 22 %   Lymphs Abs 1.4 0.7 - 4.0 K/uL   Monocytes Relative 4 %   Monocytes Absolute 0.2 0.1 - 1.0 K/uL   Eosinophils Relative 0 %   Eosinophils Absolute 0.0 0.0 - 0.5 K/uL   Basophils Relative 0 %   Basophils Absolute 0.0 0.0 - 0.1 K/uL   Immature Granulocytes 0 %   Abs Immature Granulocytes 0.02 0.00 - 0.07 K/uL  Magnesium     Status: None   Collection Time: 12/20/22  5:32 AM  Result Value Ref Range   Magnesium 2.0 1.7 - 2.4 mg/dL   Protime-INR     Status: Abnormal   Collection Time: 12/20/22  5:32 AM  Result Value Ref Range   Prothrombin Time 26.9 (H) 11.4 - 15.2 seconds   INR 2.5 (H) 0.8 - 1.2  C-reactive protein     Status:  None   Collection Time: 12/20/22  5:32 AM  Result Value Ref Range   CRP 0.7 <1.0 mg/dL   *Note: Due to a large number of results and/or encounters for the requested time period, some results have not been displayed. A complete set of results can be found in Results Review.    I have Reviewed nursing notes, Vitals, and Lab results since pt's last encounter. Pertinent lab results : see above I have ordered labwork to follow up on.  I have reviewed the last note from staff over past 24 hours I have discussed pt's care plan and test results with nursing staff, CM/SW, and other staff as appropriate  Time spent: Greater than 50% of the 55 minute visit was spent in counseling/coordination of care for the patient as laid out in the A&P.   LOS: 1 day   Dwyane Dee, MD [facility] 12/20/2022, 2:54 PM
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
## 2022-12-20 NOTE — Progress Notes (Signed)
Pt increased to 5L this shift to obtain sats >90%.  Pt has congested NP cough, given prn cough suppressant for comfort.  LS with rhonchi.  Tylenol for chronic discomfort at hs.  Meds crushed in applesauce.  Afebrile.

## 2022-12-21 DIAGNOSIS — U071 COVID-19: Secondary | ICD-10-CM | POA: Diagnosis not present

## 2022-12-21 DIAGNOSIS — J9611 Chronic respiratory failure with hypoxia: Secondary | ICD-10-CM | POA: Diagnosis not present

## 2022-12-21 DIAGNOSIS — J189 Pneumonia, unspecified organism: Secondary | ICD-10-CM | POA: Diagnosis not present

## 2022-12-21 LAB — CULTURE, BLOOD (ROUTINE X 2)
Culture: NO GROWTH
Culture: NO GROWTH

## 2022-12-21 LAB — CBC WITH DIFFERENTIAL/PLATELET
Abs Immature Granulocytes: 0.05 10*3/uL (ref 0.00–0.07)
Basophils Absolute: 0 10*3/uL (ref 0.0–0.1)
Basophils Relative: 0 %
Eosinophils Absolute: 0 10*3/uL (ref 0.0–0.5)
Eosinophils Relative: 0 %
HCT: 46.7 % (ref 39.0–52.0)
Hemoglobin: 15.3 g/dL (ref 13.0–17.0)
Immature Granulocytes: 1 %
Lymphocytes Relative: 19 %
Lymphs Abs: 2.1 10*3/uL (ref 0.7–4.0)
MCH: 28.9 pg (ref 26.0–34.0)
MCHC: 32.8 g/dL (ref 30.0–36.0)
MCV: 88.1 fL (ref 80.0–100.0)
Monocytes Absolute: 1.2 10*3/uL — ABNORMAL HIGH (ref 0.1–1.0)
Monocytes Relative: 11 %
Neutro Abs: 7.3 10*3/uL (ref 1.7–7.7)
Neutrophils Relative %: 69 %
Platelets: 349 10*3/uL (ref 150–400)
RBC: 5.3 MIL/uL (ref 4.22–5.81)
RDW: 16.4 % — ABNORMAL HIGH (ref 11.5–15.5)
WBC: 10.7 10*3/uL — ABNORMAL HIGH (ref 4.0–10.5)
nRBC: 0 % (ref 0.0–0.2)

## 2022-12-21 LAB — COMPREHENSIVE METABOLIC PANEL
ALT: 20 U/L (ref 0–44)
AST: 22 U/L (ref 15–41)
Albumin: 3.1 g/dL — ABNORMAL LOW (ref 3.5–5.0)
Alkaline Phosphatase: 103 U/L (ref 38–126)
Anion gap: 7 (ref 5–15)
BUN: 16 mg/dL (ref 8–23)
CO2: 28 mmol/L (ref 22–32)
Calcium: 9.4 mg/dL (ref 8.9–10.3)
Chloride: 105 mmol/L (ref 98–111)
Creatinine, Ser: 0.65 mg/dL (ref 0.61–1.24)
GFR, Estimated: >60 mL/min (ref >60)
Glucose, Bld: 107 mg/dL — ABNORMAL HIGH (ref 70–99)
Potassium: 3.9 mmol/L (ref 3.5–5.1)
Sodium: 140 mmol/L (ref 135–145)
Total Bilirubin: 0.7 mg/dL (ref 0.3–1.2)
Total Protein: 6.4 g/dL — ABNORMAL LOW (ref 6.5–8.1)

## 2022-12-21 LAB — PROTIME-INR
INR: 3.2 — ABNORMAL HIGH (ref 0.8–1.2)
Prothrombin Time: 32.6 seconds — ABNORMAL HIGH (ref 11.4–15.2)

## 2022-12-21 LAB — MAGNESIUM: Magnesium: 2 mg/dL (ref 1.7–2.4)

## 2022-12-21 MED ORDER — WARFARIN SODIUM 1 MG PO TABS
1.5000 mg | ORAL_TABLET | Freq: Once | ORAL | Status: AC
  Administered 2022-12-21: 1.5 mg via ORAL
  Filled 2022-12-21: qty 1

## 2022-12-21 NOTE — Progress Notes (Signed)
Pt able to tolerate hs meds crushed in applesauce, along with water, without any coughing or difficulty swallowing.  No evidence of discomfort.  Remains on 5L .  No cough assessed overnight.  No further episodes of VT on monitor.

## 2022-12-21 NOTE — Progress Notes (Signed)
Progress Note    Travis Cruz   DVV:[id number]  DOB: 15-Apr-1946  DOA: 12/16/2022     2 PCP: Dettinger, Fransisca Kaufmann, MD  Initial CC: Weakness  Hospital Course: Travis Cruz is a 76 yo male with PMH dementia, COPD, chronic hypoxic respiratory failure who presented with worsening weakness.  Workup was notable for COVID-19 infection and he was started on Paxlovid and supportive care.  He was evaluated by PT with recommendations for SNF.  Interval History:  No events overnight.  Resting in bed comfortably when seen this morning.  Getting speech eval when seen this morning.  He did not follow as many of my commands today compared to yesterday.  Otherwise no distress.  Assessment and Plan:  COVID-19 pneumonia -Complete Paxlovid course inpatient -Inflammatory markers minimally elevated -Continue supportive management with bronchodilators -He has been started on IV antibiotics for any superimposed bacterial component -Procalcitonin 0.15 -Steroid course started for some hypoxia as well   COPD with chronic hypoxic respiratory failure -Noted to have wheezing and cough despite bronchodilators -His normal on 3 L of oxygen at home; was slightly increased to 4 L -Started a course of prednisone -Wife reports that he wears BiPAP at night, will resume   History of PE -Continue on Coumadin -INR therapeutic   Hyperlipidemia -Continue statin   BPH -Continue tamsulosin   Hypertension -Continued on metoprolol -Blood pressure stable   Dementia -His wife reports that he is becoming less verbal -At baseline, he is able to transfer from bed to chair and has been ambulating a little bit with a walker -He has had overall worsening weakness -Seen by PT with recommendations for skilled nursing facility -Currently, mental status does not appear to be too far off baseline  Old records reviewed in assessment of this patient  Antimicrobials: Paxlovid 12/19 >> current   DVT prophylaxis:    warfarin (COUMADIN) tablet 1.5 mg   Code Status:   Code Status: DNR  Mobility Assessment (last 72 hours)     Mobility Assessment     Row Name 12/20/22 2020 12/19/22 1342 12/19/22 0900 12/18/22 2048     Does patient have an order for bedrest or is patient medically unstable No - Continue assessment -- Yes- Bedfast (Level 1) - Complete Yes- Bedfast (Level 1) - Complete    What is the highest level of mobility based on the progressive mobility assessment? Level 2 (Chairfast) - Balance while sitting on edge of bed and cannot stand Level 3 (Stands with assist) - Balance while standing  and cannot march in place Level 1 (Bedfast) - Unable to balance while sitting on edge of bed Level 1 (Bedfast) - Unable to balance while sitting on edge of bed    Is the above level different from baseline mobility prior to current illness? -- -- Yes - Recommend PT order Yes - Recommend PT order             Barriers to discharge:  Disposition Plan:  SNF Status is: Inpt  Objective: Blood pressure 115/67, pulse 72, temperature 98.3 F (36.8 C), resp. rate 20, height 5\' 9"  (1.753 m), weight 103.4 kg, SpO2 97 %.  Examination:  Physical Exam Constitutional:      Comments: Chronically ill-appearing elderly gentleman lying in bed in no distress  HENT:     Head: Normocephalic and atraumatic.     Mouth/Throat:     Mouth: Mucous membranes are moist.  Eyes:     Extraocular Movements: Extraocular movements intact.  Cardiovascular:  Rate and Rhythm: Normal rate and regular rhythm.  Pulmonary:     Effort: Pulmonary effort is normal.     Comments: Coarse breath sounds bilaterally, no wheezing Abdominal:     General: Bowel sounds are normal. There is no distension.     Palpations: Abdomen is soft.     Tenderness: There is no abdominal tenderness.  Musculoskeletal:        General: Normal range of motion.     Cervical back: Normal range of motion.  Skin:    General: Skin is warm and dry.   Neurological:     Mental Status: Mental status is at baseline.     Comments: Follows commands and moves all 4 extremities; underlying dementia appreciated      Consultants:    Procedures:    Data Reviewed: Results for orders placed or performed during the hospital encounter of 12/16/22 (from the past 24 hour(s))  Comprehensive metabolic panel     Status: Abnormal   Collection Time: 12/21/22  5:58 AM  Result Value Ref Range   Sodium 140 135 - 145 mmol/L   Potassium 3.9 3.5 - 5.1 mmol/L   Chloride 105 98 - 111 mmol/L   CO2 28 22 - 32 mmol/L   Glucose, Bld 107 (H) 70 - 99 mg/dL   BUN 16 8 - 23 mg/dL   Creatinine, Ser 0.65 0.61 - 1.24 mg/dL   Calcium 9.4 8.9 - 10.3 mg/dL   Total Protein 6.4 (L) 6.5 - 8.1 g/dL   Albumin 3.1 (L) 3.5 - 5.0 g/dL   AST 22 15 - 41 U/L   ALT 20 0 - 44 U/L   Alkaline Phosphatase 103 38 - 126 U/L   Total Bilirubin 0.7 0.3 - 1.2 mg/dL   GFR, Estimated >60 >60 mL/min   Anion gap 7 5 - 15  CBC with Differential/Platelet     Status: Abnormal   Collection Time: 12/21/22  5:58 AM  Result Value Ref Range   WBC 10.7 (H) 4.0 - 10.5 K/uL   RBC 5.30 4.22 - 5.81 MIL/uL   Hemoglobin 15.3 13.0 - 17.0 g/dL   HCT 46.7 39.0 - 52.0 %   MCV 88.1 80.0 - 100.0 fL   MCH 28.9 26.0 - 34.0 pg   MCHC 32.8 30.0 - 36.0 g/dL   RDW 16.4 (H) 11.5 - 15.5 %   Platelets 349 150 - 400 K/uL   nRBC 0.0 0.0 - 0.2 %   Neutrophils Relative % 69 %   Neutro Abs 7.3 1.7 - 7.7 K/uL   Lymphocytes Relative 19 %   Lymphs Abs 2.1 0.7 - 4.0 K/uL   Monocytes Relative 11 %   Monocytes Absolute 1.2 (H) 0.1 - 1.0 K/uL   Eosinophils Relative 0 %   Eosinophils Absolute 0.0 0.0 - 0.5 K/uL   Basophils Relative 0 %   Basophils Absolute 0.0 0.0 - 0.1 K/uL   Immature Granulocytes 1 %   Abs Immature Granulocytes 0.05 0.00 - 0.07 K/uL  Magnesium     Status: None   Collection Time: 12/21/22  5:58 AM  Result Value Ref Range   Magnesium 2.0 1.7 - 2.4 mg/dL  Protime-INR     Status: Abnormal    Collection Time: 12/21/22  5:58 AM  Result Value Ref Range   Prothrombin Time 32.6 (H) 11.4 - 15.2 seconds   INR 3.2 (H) 0.8 - 1.2   *Note: Due to a large number of results and/or encounters for the requested time period,  some results have not been displayed. A complete set of results can be found in Results Review.    I have Reviewed nursing notes, Vitals, and Lab results since pt's last encounter. Pertinent lab results : see above I have ordered labwork to follow up on.  I have reviewed the last note from staff over past 24 hours I have discussed pt's care plan and test results with nursing staff, CM/SW, and other staff as appropriate  Time spent: Greater than 50% of the 55 minute visit was spent in counseling/coordination of care for the patient as laid out in the A&P.   LOS: 2 days   Dwyane Dee, MD [facility] 12/21/2022, 4:54 PM

## 2022-12-21 NOTE — Progress Notes (Signed)
Approx 2215, call from tele that pt had 7 beat run of VT but currently NSR.  Vitals later assessed, pt resting, denies CP.  MD notified, no new orders.  Pt currently NSR.
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
## 2022-12-22 DIAGNOSIS — I10 Essential (primary) hypertension: Secondary | ICD-10-CM | POA: Diagnosis not present

## 2022-12-22 DIAGNOSIS — F03C0 Unspecified dementia, severe, without behavioral disturbance, psychotic disturbance, mood disturbance, and anxiety: Secondary | ICD-10-CM | POA: Diagnosis not present

## 2022-12-22 DIAGNOSIS — I428 Other cardiomyopathies: Secondary | ICD-10-CM | POA: Diagnosis not present

## 2022-12-22 DIAGNOSIS — R279 Unspecified lack of coordination: Secondary | ICD-10-CM | POA: Diagnosis not present

## 2022-12-22 DIAGNOSIS — Z741 Need for assistance with personal care: Secondary | ICD-10-CM | POA: Diagnosis not present

## 2022-12-22 DIAGNOSIS — E785 Hyperlipidemia, unspecified: Secondary | ICD-10-CM | POA: Diagnosis not present

## 2022-12-22 DIAGNOSIS — F0150 Vascular dementia without behavioral disturbance: Secondary | ICD-10-CM | POA: Diagnosis not present

## 2022-12-22 DIAGNOSIS — I69391 Dysphagia following cerebral infarction: Secondary | ICD-10-CM | POA: Diagnosis not present

## 2022-12-22 DIAGNOSIS — J189 Pneumonia, unspecified organism: Secondary | ICD-10-CM | POA: Diagnosis not present

## 2022-12-22 DIAGNOSIS — R69 Illness, unspecified: Secondary | ICD-10-CM | POA: Diagnosis not present

## 2022-12-22 DIAGNOSIS — J1282 Pneumonia due to coronavirus disease 2019: Secondary | ICD-10-CM | POA: Diagnosis not present

## 2022-12-22 DIAGNOSIS — F339 Major depressive disorder, recurrent, unspecified: Secondary | ICD-10-CM | POA: Diagnosis not present

## 2022-12-22 DIAGNOSIS — I2510 Atherosclerotic heart disease of native coronary artery without angina pectoris: Secondary | ICD-10-CM | POA: Diagnosis not present

## 2022-12-22 DIAGNOSIS — R498 Other voice and resonance disorders: Secondary | ICD-10-CM | POA: Diagnosis not present

## 2022-12-22 DIAGNOSIS — J449 Chronic obstructive pulmonary disease, unspecified: Secondary | ICD-10-CM | POA: Diagnosis not present

## 2022-12-22 DIAGNOSIS — U071 COVID-19: Secondary | ICD-10-CM | POA: Diagnosis not present

## 2022-12-22 DIAGNOSIS — J9611 Chronic respiratory failure with hypoxia: Secondary | ICD-10-CM | POA: Diagnosis not present

## 2022-12-22 DIAGNOSIS — M6281 Muscle weakness (generalized): Secondary | ICD-10-CM | POA: Diagnosis not present

## 2022-12-22 DIAGNOSIS — R627 Adult failure to thrive: Secondary | ICD-10-CM | POA: Diagnosis not present

## 2022-12-22 DIAGNOSIS — R278 Other lack of coordination: Secondary | ICD-10-CM | POA: Diagnosis not present

## 2022-12-22 DIAGNOSIS — C3412 Malignant neoplasm of upper lobe, left bronchus or lung: Secondary | ICD-10-CM | POA: Diagnosis not present

## 2022-12-22 DIAGNOSIS — C3431 Malignant neoplasm of lower lobe, right bronchus or lung: Secondary | ICD-10-CM | POA: Diagnosis not present

## 2022-12-22 DIAGNOSIS — Z86711 Personal history of pulmonary embolism: Secondary | ICD-10-CM | POA: Diagnosis not present

## 2022-12-22 DIAGNOSIS — I5042 Chronic combined systolic (congestive) and diastolic (congestive) heart failure: Secondary | ICD-10-CM | POA: Diagnosis not present

## 2022-12-22 DIAGNOSIS — R1312 Dysphagia, oropharyngeal phase: Secondary | ICD-10-CM | POA: Diagnosis not present

## 2022-12-22 DIAGNOSIS — R488 Other symbolic dysfunctions: Secondary | ICD-10-CM | POA: Diagnosis not present

## 2022-12-22 DIAGNOSIS — I429 Cardiomyopathy, unspecified: Secondary | ICD-10-CM | POA: Diagnosis not present

## 2022-12-22 DIAGNOSIS — Z86718 Personal history of other venous thrombosis and embolism: Secondary | ICD-10-CM | POA: Diagnosis not present

## 2022-12-22 DIAGNOSIS — Z7901 Long term (current) use of anticoagulants: Secondary | ICD-10-CM | POA: Diagnosis not present

## 2022-12-22 DIAGNOSIS — G4733 Obstructive sleep apnea (adult) (pediatric): Secondary | ICD-10-CM | POA: Diagnosis not present

## 2022-12-22 DIAGNOSIS — C3411 Malignant neoplasm of upper lobe, right bronchus or lung: Secondary | ICD-10-CM | POA: Diagnosis not present

## 2022-12-22 DIAGNOSIS — F0390 Unspecified dementia without behavioral disturbance: Secondary | ICD-10-CM | POA: Diagnosis not present

## 2022-12-22 DIAGNOSIS — N400 Enlarged prostate without lower urinary tract symptoms: Secondary | ICD-10-CM | POA: Diagnosis not present

## 2022-12-22 LAB — PROTIME-INR
INR: 2.9 — ABNORMAL HIGH (ref 0.8–1.2)
Prothrombin Time: 30.4 seconds — ABNORMAL HIGH (ref 11.4–15.2)

## 2022-12-22 MED ORDER — PREDNISONE 20 MG PO TABS: 40.0000 mg | ORAL_TABLET | Freq: Every day | ORAL | 0 refills | Status: AC

## 2022-12-22 MED ORDER — WARFARIN SODIUM 2 MG PO TABS: 2.0000 mg | ORAL_TABLET | Freq: Once | ORAL | Status: DC

## 2022-12-22 NOTE — Discharge Summary (Signed)
Physician Discharge Summary   Travis Cruz XBD:[id number] DOB: Mar 18, 1946 DOA: 12/16/2022  PCP: Dettinger, Fransisca Kaufmann, MD  Admit date: 12/16/2022 Discharge date: 12/22/2022 Barriers to discharge: NONE  Admitted From: Home Disposition:  SNF Discharging physician: Dwyane Dee, MD  Recommendations for Outpatient Follow-up:  Consider referral to palliative care if further decline Patient may need long term care  Home Health:  Equipment/Devices:   Discharge Condition: stable CODE STATUS: DNR Diet recommendation:  Diet Orders (From admission, onward)     Start     Ordered   12/17/22 1730  DIET SOFT Room service appropriate? Yes; Fluid consistency: Thin  Diet effective now       Question Answer Comment  Room service appropriate? Yes   Fluid consistency: Thin      12/17/22 1729            Hospital Course: [name] is a 76 yo male with PMH dementia, COPD, chronic hypoxic respiratory failure who presented with worsening weakness.  Workup was notable for COVID-19 infection and he was started on Paxlovid and supportive care.  He was evaluated by PT with recommendations for SNF.  Assessment and Plan:  COVID-19 pneumonia -Completed Paxlovid course inpatient - continue steroid 6 more days at discharge to complete 10 days total   COPD with chronic hypoxic respiratory failure -Noted to have wheezing and cough despite bronchodilators -His normal on 3 L of oxygen at home; was slightly increased to 4 L -Started a course of prednisone -wears BiPAP at night   History of PE -Continue on Coumadin -INR checks   Hyperlipidemia -Continue statin   BPH -Continue tamsulosin   Hypertension -Continued on metoprolol -Blood pressure stable   Dementia -His wife reports that he is becoming less verbal -At baseline, he is able to transfer from bed to chair and has been ambulating a little bit with a walker -He has had overall worsening weakness -Seen by PT with  recommendations for skilled nursing facility  Dysphagia - evaluated by SLP, last eval 12/21/22 - mild aspiration risk; continue dysphagia 3 diet   The patient's chronic medical conditions were treated accordingly per the patient's home medication regimen except as noted.  On day of discharge, patient was felt deemed stable for discharge. Patient/family member advised to call PCP or come back to ER if needed.   Principal Diagnosis: COVID-19  Discharge Diagnoses: Active Hospital Problems   Diagnosis Date Noted   COVID-19 12/16/2022   CAP (community acquired pneumonia) 02/27/2020   Chronic respiratory failure with hypoxia ([place]) 01/17/2020   Dementia with behavioral disturbance 09/08/2019   History of pulmonary embolus (PE) 12/04/2016   COPD (chronic obstructive pulmonary disease) (HCC)    CAD (coronary artery disease) 05/13/2013   Hyperlipidemia 05/13/2013   Hypertension 03/24/2011    Resolved Hospital Problems  No resolved problems to display.     Discharge Instructions     Increase activity slowly   Complete by: As directed    No wound care   Complete by: As directed       Allergies as of 12/22/2022       Reactions   Ativan [lorazepam] Anxiety, Other (See Comments)   Hyper  [name]   Cefuroxime Axetil Rash   Zinacef [cefuroxime] Rash        Medication List     STOP taking these medications    sulfamethoxazole-trimethoprim 800-160 MG tablet Commonly known as: Bactrim DS       TAKE  these medications    acetaminophen 325 MG tablet Commonly known as: TYLENOL Take 2 tablets (650 mg total) by mouth every 6 (six) hours as needed for mild pain, fever or headache (or Fever >/= 101).   albuterol 108 (90 Base) MCG/ACT inhaler Commonly known as: VENTOLIN HFA Inhale 2 puffs into the lungs every 4 (four) hours as needed for wheezing or shortness of breath.   ascorbic acid 500 MG tablet Commonly known as: VITAMIN C Take 1  tablet (500 mg total) by mouth daily. What changed:  when to take this additional instructions   atorvastatin 40 MG tablet Commonly known as: LIPITOR Take 1 tablet (40 mg total) by mouth 2 (two) times a week. What changed: additional instructions   azithromycin 250 MG tablet Commonly known as: ZITHROMAX Take 1 tablet (250 mg total) by mouth 3 (three) times a week. Monday Wednesday Friday.   finasteride 5 MG tablet Commonly known as: PROSCAR Take 1 tablet (5 mg total) by mouth daily.   furosemide 20 MG tablet Commonly known as: LASIX TAKE 2 TABLETS BY MOUTH EVERY DAY What changed: how much to take   metoprolol tartrate 25 MG tablet Commonly known as: LOPRESSOR TAKE 1.5 TABLETS (37.5 MG TOTAL) BY MOUTH TWICE A DAY   multivitamin with minerals Tabs tablet Take 1 tablet by mouth daily.   OXYGEN Inhale 3 L/L into the lungs.   predniSONE 20 MG tablet Commonly known as: DELTASONE Take 2 tablets (40 mg total) by mouth daily with breakfast for 6 days. Start taking on: December 23, 2022   PRESERVISION AREDS PO Take 1 capsule by mouth 2 (two) times daily.   sertraline 100 MG tablet Commonly known as: ZOLOFT TAKE 1 TABLET BY MOUTH EVERY DAY   tamsulosin 0.4 MG Caps capsule Commonly known as: FLOMAX TAKE 1 CAPSULE BY MOUTH EVERY DAY AFTER SUPPER   Trelegy Ellipta 100-62.5-25 MCG/ACT Aepb Generic drug: Fluticasone-Umeclidin-Vilant Inhale 1 puff into the lungs daily. What changed: Another medication with the same name was removed. Continue taking this medication, and follow the directions you see here.   Vitamin D3 50 MCG (2000 UT) Tabs Take 2,000 Units by mouth daily.   warfarin 3 MG tablet Commonly known as: COUMADIN Take as directed. If you are unsure how to take this medication, talk to your nurse or doctor. Original instructions: TAKE 1/2 TAB ON MON & FRI, AND 1 TAB (3 MG) ON ALL OTHER DAYS What changed:  how much to take how to take this when to take  this additional instructions        Contact information for after-discharge care     [facility] .   Service: Skilled Nursing Contact information: [address] [place] [address] [phone]                    Allergies  Allergen Reactions   Ativan [Lorazepam] Anxiety and Other (See Comments)    Hyper  [name]   Cefuroxime Axetil Rash   Zinacef [Cefuroxime] Rash    Consultations:   Procedures:   Discharge Exam: BP 120/63   Pulse (!) 54   Temp 98.1 F (36.7 C) (Oral)   Resp 20   Ht 5\' 9"  (1.753 m)   Wt 103.4 kg   SpO2 94%   BMI 33.67 kg/m  Physical Exam Constitutional:      Comments: Chronically ill-appearing elderly gentleman lying  in bed in no distress  HENT:     Head: Normocephalic and atraumatic.     Mouth/Throat:     Mouth: Mucous membranes are moist.  Eyes:     Extraocular Movements: Extraocular movements intact.  Cardiovascular:     Rate and Rhythm: Normal rate and regular rhythm.  Pulmonary:     Effort: Pulmonary effort is normal.     Comments: Coarse breath sounds bilaterally, no wheezing Abdominal:     General: Bowel sounds are normal. There is no distension.     Palpations: Abdomen is soft.     Tenderness: There is no abdominal tenderness.  Musculoskeletal:        General: Normal range of motion.     Cervical back: Normal range of motion.  Skin:    General: Skin is warm and dry.  Neurological:     Mental Status: Mental status is at baseline.     Comments: Follows commands and moves all 4 extremities; underlying dementia appreciated      The results of significant diagnostics from this hospitalization (including imaging, microbiology, ancillary and laboratory) are listed below for reference.   Microbiology: Recent Results (from the past 240 hour(s))  Blood Culture (routine x 2)     Status: None   Collection Time: 12/16/22   9:44 AM   Specimen: BLOOD  Result Value Ref Range Status   Specimen Description BLOOD BLOOD RIGHT ARM  Final   Special Requests   Final    BOTTLES DRAWN AEROBIC AND ANAEROBIC Blood Culture adequate volume LEFT ANTECUBITAL   Culture   Final    NO GROWTH 5 DAYS Performed at [facility], [address], [place], [place] [address]    Report Status 12/21/2022 FINAL  Final  Blood Culture (routine x 2)     Status: None   Collection Time: 12/16/22  9:44 AM   Specimen: BLOOD  Result Value Ref Range Status   Specimen Description BLOOD BLOOD LEFT HAND  Final   Special Requests   Final    BOTTLES DRAWN AEROBIC AND ANAEROBIC Blood Culture adequate volume LEFT ANTECUBITAL   Culture   Final    NO GROWTH 5 DAYS Performed at [facility], [address] [place], [place] [address]    Report Status 12/21/2022 FINAL  Final  Resp panel by RT-PCR (RSV, Flu A&B, Covid) Anterior Nasal Swab     Status: Abnormal   Collection Time: 12/16/22 12:00 PM   Specimen: Anterior Nasal Swab  Result Value Ref Range Status   SARS Coronavirus 2 by RT PCR POSITIVE (A) NEGATIVE Final    Comment: (NOTE) SARS-CoV-2 target nucleic acids are DETECTED.  The SARS-CoV-2 RNA is generally detectable in upper respiratory specimens during the acute phase of infection. Positive results are indicative of the presence of the identified virus, but do not rule out bacterial infection or co-infection with other pathogens not detected by the test. Clinical correlation with patient history and other diagnostic information is necessary to determine patient infection status. The expected result is Negative.  Fact Sheet for Patients: EntrepreneurPulse.com.au  Fact Sheet for Healthcare Providers: IncredibleEmployment.be  This test is not yet approved or cleared by the [place] FDA and  has been authorized for detection and/or diagnosis of SARS-CoV-2 by FDA under an Emergency Use Authorization  (EUA).  This EUA will remain in effect (meaning this test can be used) for the duration of  the COVID-19 declaration under Section 564(b)(1) of the A ct, 21 U.S.C. section 360bbb-3(b)(1), unless  the authorization is terminated or revoked sooner.     Influenza A by PCR NEGATIVE NEGATIVE Final   Influenza B by PCR NEGATIVE NEGATIVE Final    Comment: (NOTE) The Xpert Xpress SARS-CoV-2/FLU/RSV plus assay is intended as an aid in the diagnosis of influenza from Nasopharyngeal swab specimens and should not be used as a sole basis for treatment. Nasal washings and aspirates are unacceptable for Xpert Xpress SARS-CoV-2/FLU/RSV testing.  Fact Sheet for Patients: EntrepreneurPulse.com.au  Fact Sheet for Healthcare Providers: IncredibleEmployment.be  This test is not yet approved or cleared by the [place] FDA and has been authorized for detection and/or diagnosis of SARS-CoV-2 by FDA under an Emergency Use Authorization (EUA). This EUA will remain in effect (meaning this test can be used) for the duration of the COVID-19 declaration under Section 564(b)(1) of the Act, 21 U.S.C. section 360bbb-3(b)(1), unless the authorization is terminated or revoked.     Resp Syncytial Virus by PCR NEGATIVE NEGATIVE Final    Comment: (NOTE) Fact Sheet for Patients: EntrepreneurPulse.com.au  Fact Sheet for Healthcare Providers: IncredibleEmployment.be  This test is not yet approved or cleared by the [place] FDA and has been authorized for detection and/or diagnosis of SARS-CoV-2 by FDA under an Emergency Use Authorization (EUA). This EUA will remain in effect (meaning this test can be used) for the duration of the COVID-19 declaration under Section 564(b)(1) of the Act, 21 U.S.C. section 360bbb-3(b)(1), unless the authorization is terminated or revoked.  Performed at [facility], [address], [place], [place]  [address]      Labs: BNP (last 3 results) No results for input(s): "BNP" in the last 8760 hours. Basic Metabolic Panel: Recent Labs  Lab 12/17/22 0404 12/18/22 0601 12/19/22 0508 12/20/22 0532 12/21/22 0558  NA 140 140 140 140 140  K 3.9 3.8 3.8 4.5 3.9  CL 108 104 106 106 105  CO2 25 25 29 27 28   GLUCOSE 83 87 92 134* 107*  BUN 15 17 17 14 16   CREATININE 0.63 0.78 0.69 0.58* 0.65  CALCIUM 8.8* 8.9 9.0 9.3 9.4  MG 1.9 2.0 1.9 2.0 2.0   Liver Function Tests: Recent Labs  Lab 12/17/22 0404 12/18/22 0601 12/19/22 0508 12/20/22 0532 12/21/22 0558  AST 23 24 22 23 22   ALT 16 17 17 17 20   ALKPHOS 108 107 102 109 103  BILITOT 0.5 0.7 0.6 0.6 0.7  PROT 6.0* 6.3* 6.2* 6.5 6.4*  ALBUMIN 2.9* 3.0* 2.9* 3.1* 3.1*   No results for input(s): "LIPASE", "AMYLASE" in the last 168 hours. No results for input(s): "AMMONIA" in the last 168 hours. CBC: Recent Labs  Lab 12/17/22 0404 12/18/22 0601 12/19/22 0508 12/20/22 0532 12/21/22 0558  WBC 10.4 9.3 8.8 6.4 10.7*  NEUTROABS 6.2 5.3 5.5 4.8 7.3  HGB 15.2 15.3 15.3 15.5 15.3  HCT 47.5 48.2 47.2 48.3 46.7  MCV 90.1 90.4 89.6 89.1 88.1  PLT 281 274 285 327 349   Cardiac Enzymes: No results for input(s): "CKTOTAL", "CKMB", "CKMBINDEX", "TROPONINI" in the last 168 hours. BNP: Invalid input(s): "POCBNP" CBG: No results for input(s): "GLUCAP" in the last 168 hours. D-Dimer No results for input(s): "DDIMER" in the last 72 hours. Hgb A1c No results for input(s): "HGBA1C" in the last 72 hours. Lipid Profile No results for input(s): "CHOL", "HDL", "LDLCALC", "TRIG", "CHOLHDL", "LDLDIRECT" in the last 72 hours. Thyroid function studies No results for input(s): "TSH", "T4TOTAL", "T3FREE", "THYROIDAB" in the last 72 hours.  Invalid input(s): "FREET3" Anemia work  up No results for input(s): "VITAMINB12", "FOLATE", "FERRITIN", "TIBC", "IRON", "RETICCTPCT" in the last 72 hours. Urinalysis    Component Value Date/Time   COLORURINE  YELLOW 12/16/2022 1905   APPEARANCEUR CLEAR 12/16/2022 1905   APPEARANCEUR Clear 07/13/2020 0000   LABSPEC 1.014 12/16/2022 1905   PHURINE 6.0 12/16/2022 1905   GLUCOSEU NEGATIVE 12/16/2022 1905   HGBUR NEGATIVE 12/16/2022 1905   BILIRUBINUR NEGATIVE 12/16/2022 1905   BILIRUBINUR negative 11/29/2020 1313   BILIRUBINUR Negative 07/13/2020 0000   KETONESUR 5 (A) 12/16/2022 1905   PROTEINUR NEGATIVE 12/16/2022 1905   UROBILINOGEN 0.2 11/29/2020 1313   UROBILINOGEN 1.0 01/31/2013 1035   NITRITE NEGATIVE 12/16/2022 1905   LEUKOCYTESUR NEGATIVE 12/16/2022 1905   Sepsis Labs Recent Labs  Lab 12/18/22 0601 12/19/22 0508 12/20/22 0532 12/21/22 0558  WBC 9.3 8.8 6.4 10.7*   Microbiology Recent Results (from the past 240 hour(s))  Blood Culture (routine x 2)     Status: None   Collection Time: 12/16/22  9:44 AM   Specimen: BLOOD  Result Value Ref Range Status   Specimen Description BLOOD BLOOD RIGHT ARM  Final   Special Requests   Final    BOTTLES DRAWN AEROBIC AND ANAEROBIC Blood Culture adequate volume LEFT ANTECUBITAL   Culture   Final    NO GROWTH 5 DAYS Performed at [facility], [address] [place], [place] [address]    Report Status 12/21/2022 FINAL  Final  Blood Culture (routine x 2)     Status: None   Collection Time: 12/16/22  9:44 AM   Specimen: BLOOD  Result Value Ref Range Status   Specimen Description BLOOD BLOOD LEFT HAND  Final   Special Requests   Final    BOTTLES DRAWN AEROBIC AND ANAEROBIC Blood Culture adequate volume LEFT ANTECUBITAL   Culture   Final    NO GROWTH 5 DAYS Performed at [facility], [address] [place], [place] [address]    Report Status 12/21/2022 FINAL  Final  Resp panel by RT-PCR (RSV, Flu A&B, Covid) Anterior Nasal Swab     Status: Abnormal   Collection Time: 12/16/22 12:00 PM   Specimen: Anterior Nasal Swab  Result Value Ref Range Status   SARS Coronavirus 2 by RT PCR POSITIVE (A) NEGATIVE Final    Comment:  (NOTE) SARS-CoV-2 target nucleic acids are DETECTED.  The SARS-CoV-2 RNA is generally detectable in upper respiratory specimens during the acute phase of infection. Positive results are indicative of the presence of the identified virus, but do not rule out bacterial infection or co-infection with other pathogens not detected by the test. Clinical correlation with patient history and other diagnostic information is necessary to determine patient infection status. The expected result is Negative.  Fact Sheet for Patients: EntrepreneurPulse.com.au  Fact Sheet for Healthcare Providers: IncredibleEmployment.be  This test is not yet approved or cleared by the [place] FDA and  has been authorized for detection and/or diagnosis of SARS-CoV-2 by FDA under an Emergency Use Authorization (EUA).  This EUA will remain in effect (meaning this test can be used) for the duration of  the COVID-19 declaration under Section 564(b)(1) of the A ct, 21 U.S.C. section 360bbb-3(b)(1), unless the authorization is terminated or revoked sooner.     Influenza A by PCR NEGATIVE NEGATIVE Final   Influenza B by PCR NEGATIVE NEGATIVE Final    Comment: (NOTE) The Xpert Xpress SARS-CoV-2/FLU/RSV plus assay is intended as an aid in the diagnosis of influenza from Nasopharyngeal swab specimens and  should not be used as a sole basis for treatment. Nasal washings and aspirates are unacceptable for Xpert Xpress SARS-CoV-2/FLU/RSV testing.  Fact Sheet for Patients: EntrepreneurPulse.com.au  Fact Sheet for Healthcare Providers: IncredibleEmployment.be  This test is not yet approved or cleared by the [place] FDA and has been authorized for detection and/or diagnosis of SARS-CoV-2 by FDA under an Emergency Use Authorization (EUA). This EUA will remain in effect (meaning this test can be used) for the duration of the COVID-19  declaration under Section 564(b)(1) of the Act, 21 U.S.C. section 360bbb-3(b)(1), unless the authorization is terminated or revoked.     Resp Syncytial Virus by PCR NEGATIVE NEGATIVE Final    Comment: (NOTE) Fact Sheet for Patients: EntrepreneurPulse.com.au  Fact Sheet for Healthcare Providers: IncredibleEmployment.be  This test is not yet approved or cleared by the [place] FDA and has been authorized for detection and/or diagnosis of SARS-CoV-2 by FDA under an Emergency Use Authorization (EUA). This EUA will remain in effect (meaning this test can be used) for the duration of the COVID-19 declaration under Section 564(b)(1) of the Act, 21 U.S.C. section 360bbb-3(b)(1), unless the authorization is terminated or revoked.  Performed at [facility], [address], [place], [place] [address]     Procedures/Studies: CT Head Wo Contrast  Result Date: 12/16/2022 CLINICAL DATA:  Altered mental status. Weakness and fever. History of dementia. EXAM: CT HEAD WITHOUT CONTRAST TECHNIQUE: Contiguous axial images were obtained from the base of the skull through the vertex without intravenous contrast. RADIATION DOSE REDUCTION: This exam was performed according to the departmental dose-optimization program which includes automated exposure control, adjustment of the mA and/or kV according to patient size and/or use of iterative reconstruction technique. COMPARISON:  Head CT 03/01/2020 FINDINGS: Brain: There is no evidence of an acute infarct, intracranial hemorrhage, mass, or midline shift. Chronic bilateral parieto-occipital infarcts, left larger than right, as well as small chronic infarcts in the thalami and left cerebellar hemisphere are unchanged. There are also unchanged remote insults in the globi pallidi. Hypodensities elsewhere in the cerebral white matter bilaterally are also similar to the prior study and are nonspecific but compatible with  mild-to-moderate chronic small vessel ischemic disease. There is mild-to-moderate cerebral atrophy. Mildly prominent extra-axial CSF over the frontal convexities is unchanged and favored to reflect cerebral atrophy with small chronic subdural hygromas considered less likely. Vascular: Calcified atherosclerosis at the skull base. No suspicious acute vascular hyperdensity. Skull: No fracture or suspicious osseous lesion. Sinuses/Orbits: Paranasal sinuses and mastoid air cells are clear. Bilateral cataract extraction. Other: None. IMPRESSION: 1. No evidence of acute intracranial abnormality. 2. Chronic ischemia with multiple old infarcts as above. Electronically Signed   By: Logan Bores M.D.   On: 12/16/2022 14:48   DG Chest Port 1 View  Result Date: 12/16/2022 CLINICAL DATA:  Sepsis. EXAM: PORTABLE CHEST 1 VIEW COMPARISON:  Chest x-ray from 2021 and multiple recent chest CTs. The most recent is 11/28/2022 FINDINGS: The heart is borderline enlarged but stable. Right ventricular pacer wire is stable. Severe chronic underlying lung disease with emphysema and extensive pulmonary scarring. No obvious acute overlying pulmonary process but superimposed infiltrates could easily be missed. IMPRESSION: Severe chronic underlying lung disease. No obvious acute overlying pulmonary process but superimposed infiltrates could easily be missed. Electronically Signed   By: Marijo Sanes M.D.   On: 12/16/2022 09:40   CT Chest W Contrast  Result Date: 11/30/2022 CLINICAL DATA:  76 year old male with history of non-small cell lung cancer. Follow-up study. *  Tracking Code: BO * EXAM: CT CHEST WITH CONTRAST TECHNIQUE: Multidetector CT imaging of the chest was performed during intravenous contrast administration. RADIATION DOSE REDUCTION: This exam was performed according to the departmental dose-optimization program which includes automated exposure control, adjustment of the mA and/or kV according to patient size and/or use of  iterative reconstruction technique. CONTRAST:  29mL OMNIPAQUE IOHEXOL 300 MG/ML  SOLN COMPARISON:  Chest CT 05/22/2022.  PET-CT 06/18/2022. FINDINGS: Cardiovascular: Heart size is normal. There is no significant pericardial fluid, thickening or pericardial calcification. There is aortic atherosclerosis, as well as atherosclerosis of the great vessels of the mediastinum and the coronary arteries, including calcified atherosclerotic plaque in the left main, left anterior descending, left circumflex and right coronary arteries. Right-sided pacemaker/AICD device with single lead entering via left subclavian vein approach, terminating with tip in the apex of the right ventricle. Mediastinum/Nodes: No pathologically enlarged mediastinal or hilar lymph nodes. Esophagus is unremarkable in appearance. Severe tracheomalacia. No axillary lymphadenopathy. Lungs/Pleura: Previously noted left upper lobe nodule is less bulky than the prior examination, currently measuring 2.6 x 1.4 cm (axial image 56 of series 2) as compared with 2.5 x 2.5 cm on prior chest CT 05/22/2022. Surrounding areas of ground-glass attenuation, septal thickening and architectural distortion in the adjacent left lung, most compatible with evolving postradiation changes. Chronic areas of mass-like architectural distortion and chronic volume loss are also noted in the posterior aspect of the left upper lobe and posterior aspect of the right upper lobe, similar to numerous prior examinations, most compatible with areas of chronic postradiation fibrosis. No acute consolidative airspace disease. No pleural effusions. Mild diffuse bronchial wall thickening with moderate centrilobular and paraseptal emphysema. Upper Abdomen: Multiple calcified gallstones lying dependently in the gallbladder. A few scattered colonic diverticula are noted in the visualized portions of the colon. Musculoskeletal: Multiple chronic appearing compression fractures are noted at T6, T10,  T12 and L1, similar to the prior study, most severe at T6 where there is a vertebral plana appearance. Well-circumscribed superficial low-attenuation lesion measuring 2.6 x 1.7 cm in the subcutaneous fat of the anterior chest wall inferiorly (axial image 109 of series 2), similar to the prior study, presumably a sebaceous cyst. There are no aggressive appearing lytic or blastic lesions noted in the visualized portions of the skeleton. IMPRESSION: 1. Today's study demonstrates a positive response to therapy with slight regression of previously noted left upper lobe pulmonary nodule, with what appears to be evolving surrounding postradiation changes in the adjacent lung. Other chronic postradiation changes are also similar to prior examinations, as above. No lymphadenopathy or other suspicious findings on today's examination. 2. Diffuse bronchial wall thickening with moderate centrilobular and paraseptal emphysema; imaging findings suggestive of underlying COPD. 3. Severe tracheomalacia. 4. Aortic atherosclerosis, in addition to left main and three-vessel coronary artery disease. Assessment for potential risk factor modification, dietary therapy or pharmacologic therapy may be warranted, if clinically indicated. 5. Cholelithiasis. 6. Colonic diverticulosis. Aortic Atherosclerosis (ICD10-I70.0) and Emphysema (ICD10-J43.9). Electronically Signed   By: Vinnie Langton M.D.   On: 11/30/2022 12:59     Time coordinating discharge: Over 30 minutes    Dwyane Dee, MD  [facility] 12/22/2022, 10:21 AM

## 2022-12-22 NOTE — Progress Notes (Signed)
Nsg Discharge Note  Admit Date:  12/16/2022 Discharge date: 12/22/2022   Travis Cruz to be D/C'd Home per MD order.  AVS completed.  Patient/caregiver able to verbalize understanding.  Discharge Medication: Allergies as of 12/22/2022       Reactions   Ativan [lorazepam] Anxiety, Other (See Comments)   Hyper  [name]   Cefuroxime Axetil Rash   Zinacef [cefuroxime] Rash        Medication List     STOP taking these medications    sulfamethoxazole-trimethoprim 800-160 MG tablet Commonly known as: Bactrim DS       TAKE these medications    acetaminophen 325 MG tablet Commonly known as: TYLENOL Take 2 tablets (650 mg total) by mouth every 6 (six) hours as needed for mild pain, fever or headache (or Fever >/= 101).   albuterol 108 (90 Base) MCG/ACT inhaler Commonly known as: VENTOLIN HFA Inhale 2 puffs into the lungs every 4 (four) hours as needed for wheezing or shortness of breath.   ascorbic acid 500 MG tablet Commonly known as: VITAMIN C Take 1 tablet (500 mg total) by mouth daily. What changed:  when to take this additional instructions   atorvastatin 40 MG tablet Commonly known as: LIPITOR Take 1 tablet (40 mg total) by mouth 2 (two) times a week. What changed: additional instructions   azithromycin 250 MG tablet Commonly known as: ZITHROMAX Take 1 tablet (250 mg total) by mouth 3 (three) times a week. Monday Wednesday Friday.   finasteride 5 MG tablet Commonly known as: PROSCAR Take 1 tablet (5 mg total) by mouth daily.   furosemide 20 MG tablet Commonly known as: LASIX TAKE 2 TABLETS BY MOUTH EVERY DAY What changed: how much to take   metoprolol tartrate 25 MG tablet Commonly known as: LOPRESSOR TAKE 1.5 TABLETS (37.5 MG TOTAL) BY MOUTH TWICE A DAY   multivitamin with minerals Tabs tablet Take 1 tablet by mouth daily.   OXYGEN Inhale 3 L/L into the lungs.   predniSONE 20 MG tablet Commonly  known as: DELTASONE Take 2 tablets (40 mg total) by mouth daily with breakfast for 6 days. Start taking on: December 23, 2022   PRESERVISION AREDS PO Take 1 capsule by mouth 2 (two) times daily.   sertraline 100 MG tablet Commonly known as: ZOLOFT TAKE 1 TABLET BY MOUTH EVERY DAY   tamsulosin 0.4 MG Caps capsule Commonly known as: FLOMAX TAKE 1 CAPSULE BY MOUTH EVERY DAY AFTER SUPPER   Trelegy Ellipta 100-62.5-25 MCG/ACT Aepb Generic drug: Fluticasone-Umeclidin-Vilant Inhale 1 puff into the lungs daily. What changed: Another medication with the same name was removed. Continue taking this medication, and follow the directions you see here.   Vitamin D3 50 MCG (2000 UT) Tabs Take 2,000 Units by mouth daily.   warfarin 3 MG tablet Commonly known as: COUMADIN Take as directed. If you are unsure how to take this medication, talk to your nurse or doctor. Original instructions: TAKE 1/2 TAB ON MON & FRI, AND 1 TAB (3 MG) ON ALL OTHER DAYS What changed:  how much to take how to take this when to take this additional instructions        Discharge Assessment: Vitals:   12/22/22 0603 12/22/22 0729  BP: 120/63   Pulse: (!) 54   Resp:    Temp:    SpO2: 92% 94%   Skin clean, dry and intact without evidence of skin break down, no evidence of  skin tears noted. IV catheter discontinued intact. Site without signs and symptoms of complications - no redness or edema noted at insertion site, patient denies c/o pain - only slight tenderness at site.  Dressing with slight pressure applied.  D/c Instructions-Education: Discharge instructions given to patient/family with verbalized understanding. D/c education completed with patient/family including follow up instructions, medication list, d/c activities limitations if indicated, with other d/c instructions as indicated by MD - patient able to verbalize understanding, all questions fully answered. Patient instructed to return to ED, call  911, or call MD for any changes in condition.  Patient escorted via [place], and D/C home via private auto.  Kathie Rhodes, RN 12/22/2022 10:39 AM
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
## 2022-12-22 NOTE — TOC Transition Note (Signed)
Transition of Care [facility]) - CM/SW Discharge Note   Patient Details  Name: Travis Cruz MRN: [id number] Date of Birth: January 03, 1946  Transition of Care [facility]) CM/SW Contact:  Shade Flood, LCSW Phone Number: 12/22/2022, 10:36 AM   Clinical Narrative:     Pt stable to transfer to [facility] today as planned per MD. DC clinical sent electronically. RN to call report.  No other TOC needs for dc.  Final next level of care: Skilled Nursing Facility Barriers to Discharge: Barriers Resolved   Patient Goals and CMS Choice CMS Medicare.gov Compare Post Acute Care list provided to:: Patient Represenative (must comment) Choice offered to / list presented to : Spouse  Discharge Placement                Patient chooses bed at: [facility] Patient to be transferred to facility by: w/c Name of family member notified: Mrs. Salinas Patient and family notified of of transfer: 12/22/22  Discharge Plan and Services Additional resources added to the After Visit Summary for   In-house Referral: Clinical Social Work   Post Acute Care Choice: [place]                               Social Determinants of Health (SDOH) Interventions SDOH Screenings   Food Insecurity: No Food Insecurity (04/16/2022)  Housing: Low Risk  (04/16/2022)  Transportation Needs: No Transportation Needs (04/16/2022)  Alcohol Screen: Low Risk  (04/16/2022)  Depression (PHQ2-9): Medium Risk (05/01/2022)  Financial Resource Strain: Low Risk  (04/16/2022)  Physical Activity: Inactive (04/16/2022)  Social Connections: Moderately Isolated (04/16/2022)  Stress: No Stress Concern Present (04/16/2022)  Tobacco Use: Medium Risk (12/16/2022)     Readmission Risk Interventions    12/13/2020   12:35 PM  Readmission Risk Prevention Plan  Transportation Screening Complete  Medication Review ([place]) Complete  PCP or Specialist appointment within 3-5 days of discharge Not Complete   PCP/Specialist Appt Not Complete comments Patient will see SNF medical director  [place] or [place] Complete  SW Recovery Care/Counseling Consult Complete  Palliative Care Screening Not [place] Complete

## 2022-12-22 NOTE — Progress Notes (Signed)
Pt able to tolerate 420 ml water at hs, however towards the end started coughing.  Meds crushed in applesauce.  Tylenol given at hs for comfort and cough suppressant given with good relief.  CPAP at noc.  Minimal interactions with staff.  Repositioned for comfort.

## 2022-12-23 ENCOUNTER — Encounter: Payer: Self-pay | Admitting: Adult Health

## 2022-12-23 ENCOUNTER — Non-Acute Institutional Stay (SKILLED_NURSING_FACILITY): Payer: Medicare HMO | Admitting: Adult Health

## 2022-12-23 ENCOUNTER — Other Ambulatory Visit (HOSPITAL_COMMUNITY)
Admission: RE | Admit: 2022-12-23 | Discharge: 2022-12-23 | Disposition: A | Discharge status: Home / Self Care | Payer: Medicare HMO | Source: Skilled Nursing Facility | Attending: Adult Health | Admitting: Adult Health

## 2022-12-23 DIAGNOSIS — I10 Essential (primary) hypertension: Secondary | ICD-10-CM

## 2022-12-23 DIAGNOSIS — Z86718 Personal history of other venous thrombosis and embolism: Secondary | ICD-10-CM | POA: Diagnosis not present

## 2022-12-23 DIAGNOSIS — Z86711 Personal history of pulmonary embolism: Secondary | ICD-10-CM | POA: Diagnosis not present

## 2022-12-23 DIAGNOSIS — F0393 Unspecified dementia, unspecified severity, with mood disturbance: Secondary | ICD-10-CM | POA: Insufficient documentation

## 2022-12-23 DIAGNOSIS — U071 COVID-19: Secondary | ICD-10-CM | POA: Diagnosis not present

## 2022-12-23 DIAGNOSIS — I428 Other cardiomyopathies: Secondary | ICD-10-CM

## 2022-12-23 DIAGNOSIS — I482 Chronic atrial fibrillation, unspecified: Secondary | ICD-10-CM | POA: Insufficient documentation

## 2022-12-23 DIAGNOSIS — G4733 Obstructive sleep apnea (adult) (pediatric): Secondary | ICD-10-CM | POA: Diagnosis not present

## 2022-12-23 DIAGNOSIS — J449 Chronic obstructive pulmonary disease, unspecified: Secondary | ICD-10-CM

## 2022-12-23 DIAGNOSIS — Z7901 Long term (current) use of anticoagulants: Secondary | ICD-10-CM | POA: Insufficient documentation

## 2022-12-23 DIAGNOSIS — F0150 Vascular dementia without behavioral disturbance: Secondary | ICD-10-CM

## 2022-12-23 DIAGNOSIS — C3431 Malignant neoplasm of lower lobe, right bronchus or lung: Secondary | ICD-10-CM | POA: Diagnosis not present

## 2022-12-23 DIAGNOSIS — I5042 Chronic combined systolic (congestive) and diastolic (congestive) heart failure: Secondary | ICD-10-CM | POA: Diagnosis not present

## 2022-12-23 DIAGNOSIS — J9611 Chronic respiratory failure with hypoxia: Secondary | ICD-10-CM | POA: Diagnosis not present

## 2022-12-23 DIAGNOSIS — J1282 Pneumonia due to coronavirus disease 2019: Secondary | ICD-10-CM

## 2022-12-23 DIAGNOSIS — C3412 Malignant neoplasm of upper lobe, left bronchus or lung: Secondary | ICD-10-CM | POA: Diagnosis not present

## 2022-12-23 DIAGNOSIS — N400 Enlarged prostate without lower urinary tract symptoms: Secondary | ICD-10-CM | POA: Insufficient documentation

## 2022-12-23 DIAGNOSIS — R69 Illness, unspecified: Secondary | ICD-10-CM | POA: Diagnosis not present

## 2022-12-23 DIAGNOSIS — E782 Mixed hyperlipidemia: Secondary | ICD-10-CM

## 2022-12-23 LAB — PROTIME-INR
INR: 3.5 — ABNORMAL HIGH (ref 0.8–1.2)
Prothrombin Time: 34.6 seconds — ABNORMAL HIGH (ref 11.4–15.2)
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
## 2022-12-24 ENCOUNTER — Non-Acute Institutional Stay (SKILLED_NURSING_FACILITY): Payer: Medicare HMO | Admitting: Internal Medicine

## 2022-12-24 ENCOUNTER — Encounter: Payer: Self-pay | Admitting: Internal Medicine

## 2022-12-24 DIAGNOSIS — J1282 Pneumonia due to coronavirus disease 2019: Secondary | ICD-10-CM | POA: Diagnosis not present

## 2022-12-24 DIAGNOSIS — U071 COVID-19: Secondary | ICD-10-CM | POA: Diagnosis not present

## 2022-12-24 DIAGNOSIS — R69 Illness, unspecified: Secondary | ICD-10-CM | POA: Diagnosis not present

## 2022-12-24 DIAGNOSIS — I5042 Chronic combined systolic (congestive) and diastolic (congestive) heart failure: Secondary | ICD-10-CM | POA: Diagnosis not present

## 2022-12-24 DIAGNOSIS — F0150 Vascular dementia without behavioral disturbance: Secondary | ICD-10-CM | POA: Diagnosis not present

## 2022-12-24 DIAGNOSIS — I69391 Dysphagia following cerebral infarction: Secondary | ICD-10-CM | POA: Diagnosis not present

## 2022-12-24 NOTE — Patient Instructions (Signed)
See assessment and plan under each diagnosis in the problem list and acutely for this visit 

## 2022-12-24 NOTE — Assessment & Plan Note (Signed)
CHF clinically compensated; no NVD or significant peripheral edema. No change in present cardiac regimen indicated.

## 2022-12-24 NOTE — Progress Notes (Signed)
NURSING HOME LOCATION:  [facility] ROOM NUMBER: 133 P   CODE STATUS:  DNR  PCP: Caryl Pina MD  This is a comprehensive admission note to this SNFperformed on this date less than 30 days from date of admission. Included are preadmission medical/surgical history; reconciled medication list; family history; social history and comprehensive review of systems.  Corrections and additions to the records were documented. Comprehensive physical exam was also performed. Additionally a clinical summary was entered for each active diagnosis pertinent to this admission in the Problem List to enhance continuity of care.  HPI: He was hospitalized 12/19 - 12/22/2022 with COVID-19 pneumonia.  He presented with worsening weakness in the context of history of COPD and chronic hypoxic respiratory failure.  He completed a full course of Paxlovid while hospitalized. Despite bronchodilators bronchospasm was present with wheezing and cough.  Oxygen supplementation had to be increased from maintenance of 3 L up to 4 L/min.  Prednisone was initiated with full course to be completed over 6 days postdischarge.  BiPAP at night was also continued. SLP evaluated him for dysphagia; aspiration risk was felt to be mild.  Dysphagia 3 diet was continued. Labs reveal mild elevation of white blood count with a peak count of 10,900.  At presentation polycythemia was suggested with H/H of 17.2/63.4.  With rehydration values were 15.3/46.7 at discharge.  Glucoses while hospitalized ranged from a low of 83 up to a high of 134.  Protein/caloric malnutrition was suggested by an albumin of 3.1 and total protein of 6.4.  CKD stage II was present.  Nadir creatinine was 0.68 with a GFR greater than 60. PT/OT recommended SNF placement for rehab.  Past medical and surgical history includes history of PTE, dyslipidemia, BPH, essential hypertension, CAD with history of MI, history of congestive heart failure, history of  cholelithiasis, ischemic cardiomyopathy, history of lung cancer, OSA, history of seizures, history of stroke, history of ventricular fibrillation and dementia. Surgeries and procedures include cardiac defibrillator placement, colonoscopy, femur nailing, lung biopsy, and splenectomy.  Social history: Nondrinker at present; former smoker with over 50-pack-year history of consumption.  Family history: Extensive history reviewed.  His mother also had lung cancer and there is a strong family history of CAD and diabetes.   Review of systems could not be completed as he was nonverbal and could not follow commandss.  Physical exam:  Pertinent or positive findings: As noted he was nonverbal.  He did exhibit intermittent opening and closing of his mouth without purpose.  Pattern alopecia is present.  His facial expression is almost one of grimacing.  He is wearing nasal oxygen.  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  The maxilla is edentulous.  The mandibular teeth are coated.  Heart sounds are distant.  He has low-grade honking expiratory musical rhonchi bilaterally.  Pedal pulses are decreased.  Slight clubbing of the nailbeds is suggested.  He has trace edema at the sock line.  He has severe ecchymotic changes over the forearms, greater on the right than the left.  He has extensive vitiliginous scarring greater on the left.  Extensive operative scars are present around the left knee and upper shin.  General appearance: no acute distress, increased work of breathing is present.   Lymphatic: No lymphadenopathy about the head, neck, axilla. Eyes: No conjunctival inflammation or lid edema is present. There is no scleral icterus. Ears:  External ear exam shows no significant lesions or deformities.   Nose:  External nasal examination  shows no deformity or inflammation. Nasal mucosa are pink and moist without lesions, exudates Neck:  No thyromegaly, masses, tenderness noted.    Heart:  No gallop, murmur, click, rub.   Lungs: without wheezes,  rubs. Abdomen: Bowel sounds are normal.  Abdomen is soft and nontender with no organomegaly, hernias, masses. GU: Deferred  Extremities:  No cyanosis. Neurologic exam:  Strength & Rhomberg, & finger to nose testing could not be completed due to clinical state Skin: Warm & dry w/o tenting.  See clinical summary under each active problem in the Problem List with associated updated therapeutic plan

## 2022-12-24 NOTE — Assessment & Plan Note (Signed)
Patient was essentially nonverbal and could not follow commands.  No behavioral issues reported to date at [facility].

## 2022-12-24 NOTE — Assessment & Plan Note (Signed)
SLP evaluated him while hospitalized and felt that aspiration risk was mild.  Dysphagia 3 diet continued.  SLP to follow at SNF.

## 2022-12-24 NOTE — Assessment & Plan Note (Addendum)
Complete full course of prednisone at SNF. O2 sats good on supplemental O2.

## 2022-12-30 ENCOUNTER — Other Ambulatory Visit (HOSPITAL_COMMUNITY)
Admission: RE | Admit: 2022-12-30 | Discharge: 2022-12-30 | Disposition: A | Discharge status: Home / Self Care | Payer: Medicare HMO | Source: Skilled Nursing Facility | Attending: Adult Health | Admitting: Adult Health

## 2022-12-30 ENCOUNTER — Encounter: Payer: Self-pay | Admitting: Adult Health

## 2022-12-30 ENCOUNTER — Non-Acute Institutional Stay (SKILLED_NURSING_FACILITY): Payer: Medicare HMO | Admitting: Adult Health

## 2022-12-30 DIAGNOSIS — Z7901 Long term (current) use of anticoagulants: Secondary | ICD-10-CM

## 2022-12-30 DIAGNOSIS — Z86711 Personal history of pulmonary embolism: Secondary | ICD-10-CM | POA: Diagnosis not present

## 2022-12-30 DIAGNOSIS — Z86718 Personal history of other venous thrombosis and embolism: Secondary | ICD-10-CM | POA: Diagnosis not present

## 2022-12-30 LAB — PROTIME-INR
INR: 2.9 — ABNORMAL HIGH (ref 0.8–1.2)
Prothrombin Time: 29.9 seconds — ABNORMAL HIGH (ref 11.4–15.2)
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
## 2022-12-31 ENCOUNTER — Non-Acute Institutional Stay (SKILLED_NURSING_FACILITY): Payer: Medicare HMO | Admitting: Adult Health

## 2022-12-31 ENCOUNTER — Encounter: Payer: Self-pay | Admitting: Adult Health

## 2022-12-31 DIAGNOSIS — R627 Adult failure to thrive: Secondary | ICD-10-CM

## 2022-12-31 DIAGNOSIS — F0150 Vascular dementia without behavioral disturbance: Secondary | ICD-10-CM | POA: Diagnosis not present

## 2022-12-31 DIAGNOSIS — R69 Illness, unspecified: Secondary | ICD-10-CM | POA: Diagnosis not present
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
## 2023-01-01 LAB — CUP PACEART REMOTE DEVICE CHECK
Battery Remaining Longevity: 10 mo
Battery Remaining Percentage: 9 %
Battery Voltage: 2.66 V
Brady Statistic RV Percent Paced: 1 %
Date Time Interrogation Session: 20240104020018
HighPow Impedance: 105 Ohm
HighPow Impedance: 105 Ohm
Implantable Lead Connection Status: 753985
Implantable Lead Implant Date: 20140205
Implantable Lead Location: 753860
Implantable Lead Model: 181
Implantable Lead Serial Number: 323859
Implantable Pulse Generator Implant Date: 20140205
Lead Channel Impedance Value: 510 Ohm
Lead Channel Pacing Threshold Amplitude: 1 V
Lead Channel Pacing Threshold Pulse Width: 0.5 ms
Lead Channel Sensing Intrinsic Amplitude: 11.8 mV
Lead Channel Setting Pacing Amplitude: 2.5 V
Lead Channel Setting Pacing Pulse Width: 0.5 ms
Lead Channel Setting Sensing Sensitivity: 0.5 mV
Pulse Gen Serial Number: 7040910

## 2023-01-02 ENCOUNTER — Non-Acute Institutional Stay (SKILLED_NURSING_FACILITY): Payer: Medicare HMO | Admitting: Adult Health

## 2023-01-02 ENCOUNTER — Encounter: Payer: Self-pay | Admitting: Adult Health

## 2023-01-02 DIAGNOSIS — C3412 Malignant neoplasm of upper lobe, left bronchus or lung: Secondary | ICD-10-CM

## 2023-01-02 DIAGNOSIS — F0150 Vascular dementia without behavioral disturbance: Secondary | ICD-10-CM

## 2023-01-02 DIAGNOSIS — C3411 Malignant neoplasm of upper lobe, right bronchus or lung: Secondary | ICD-10-CM | POA: Diagnosis not present

## 2023-01-02 DIAGNOSIS — R69 Illness, unspecified: Secondary | ICD-10-CM | POA: Diagnosis not present
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
## 2023-01-04 DIAGNOSIS — J449 Chronic obstructive pulmonary disease, unspecified: Secondary | ICD-10-CM | POA: Diagnosis not present

## 2023-01-05 ENCOUNTER — Other Ambulatory Visit: Payer: Self-pay | Admitting: Family Medicine

## 2023-01-05 DIAGNOSIS — I5032 Chronic diastolic (congestive) heart failure: Secondary | ICD-10-CM

## 2023-01-05 DIAGNOSIS — I10 Essential (primary) hypertension: Secondary | ICD-10-CM

## 2023-01-06 ENCOUNTER — Telehealth: Payer: Self-pay | Admitting: Internal Medicine

## 2023-01-06 NOTE — Telephone Encounter (Signed)
Pt's wife would like a callback regarding pt's device. Please advise

## 2023-01-06 NOTE — Telephone Encounter (Addendum)
Reviewed Dr. Olin Pia last [place] note with patient/wife. 11/21/20 does address discussion around end of life and inactivation.  At that time, patient/wife wanted to continue with therapy. However, as patient's condition since has deteriorated, they have put in place an active DNR effective 12/19/22 and would like for ICD to be "turned off".   Dr. Caryl Comes out of the office. Reviewed with Oda Kilts, PA-C who agrees with and orders d/c of tachy therapies.   Spoke with patient's wife and informed her of approval and plan to move forward.  [employer] rep [employer], contacted and is arranging to go to the nursing facility to make the changes.  He and/or his team will reach out to wife to coordinate as she has requested to know when this is done.

## 2023-01-06 NOTE — Telephone Encounter (Addendum)
Spoke with pt's wife, DPR who reports pt's health has declined.  He is currently in a skilled nursing facility.  She has requested he be made a DNR and would like to have pt's ICD therapies inactivated as discussed at pt's last OV with Dr Caryl Comes 10/2020.  Pt's wife advised will forward to our device clinic and Dr Caryl Comes to make them aware and she will be contacted regarding next steps.  Pt's wife verbalizes understanding and agrees with current plan.

## 2023-01-08 ENCOUNTER — Other Ambulatory Visit: Payer: Self-pay | Admitting: Family Medicine

## 2023-01-08 DIAGNOSIS — R627 Adult failure to thrive: Secondary | ICD-10-CM | POA: Insufficient documentation

## 2023-01-08 DIAGNOSIS — I10 Essential (primary) hypertension: Secondary | ICD-10-CM

## 2023-01-08 DIAGNOSIS — I5032 Chronic diastolic (congestive) heart failure: Secondary | ICD-10-CM

## 2023-01-09 ENCOUNTER — Encounter: Payer: Self-pay | Admitting: Pulmonary Disease

## 2023-01-09 ENCOUNTER — Encounter: Payer: Self-pay | Admitting: Family Medicine

## 2023-01-09 ENCOUNTER — Encounter: Payer: Self-pay | Admitting: Urology

## 2023-01-09 NOTE — Telephone Encounter (Signed)
Mychart message received from pt's spouse: [name] "Gene"  P [facility] Pool (supporting Karren Burly, MD)4 hours ago (5:09 AM)    This is to inform you of the passing of [employer] (DOB 10/17/2046) on 01/17/2023. Please Wendee Beavers his record and cancel any future appointments.   Regards [employer]     Routing to Dr. Judeth Horn as an Lorain Childes. I expressed my condolences to pt's spouse.

## 2023-01-29 DEATH — deceased

## 2023-05-18 NOTE — Telephone Encounter (Signed)
Erroneous encounter will close.

## 2023-06-10 ENCOUNTER — Ambulatory Visit: Payer: Self-pay | Admitting: Urology

## 2024-08-02 NOTE — Progress Notes (Signed)
 This encounter was created in error - please disregard.
